# Patient Record
Sex: Male | Born: 1935 | Race: White | Hispanic: No | Smoking: Never smoker
Health system: Southern US, Community
[De-identification: ages and names within clinical notes are randomized; demographics above are authoritative.]

## PROBLEM LIST (undated history)

## (undated) DIAGNOSIS — E039 Hypothyroidism, unspecified: Secondary | ICD-10-CM

## (undated) DIAGNOSIS — I10 Essential (primary) hypertension: Secondary | ICD-10-CM

## (undated) DIAGNOSIS — G8918 Other acute postprocedural pain: Secondary | ICD-10-CM

## (undated) DIAGNOSIS — C439 Malignant melanoma of skin, unspecified: Secondary | ICD-10-CM

## (undated) DIAGNOSIS — IMO0002 Reserved for concepts with insufficient information to code with codable children: Secondary | ICD-10-CM

## (undated) DIAGNOSIS — E785 Hyperlipidemia, unspecified: Secondary | ICD-10-CM

## (undated) DIAGNOSIS — N289 Disorder of kidney and ureter, unspecified: Secondary | ICD-10-CM

## (undated) DIAGNOSIS — K219 Gastro-esophageal reflux disease without esophagitis: Secondary | ICD-10-CM

## (undated) DIAGNOSIS — M0520 Rheumatoid vasculitis with rheumatoid arthritis of unspecified site: Secondary | ICD-10-CM

## (undated) DIAGNOSIS — C801 Malignant (primary) neoplasm, unspecified: Secondary | ICD-10-CM

## (undated) DIAGNOSIS — T7840XA Allergy, unspecified, initial encounter: Secondary | ICD-10-CM

## (undated) DIAGNOSIS — K2270 Barrett's esophagus without dysplasia: Secondary | ICD-10-CM

## (undated) DIAGNOSIS — G4730 Sleep apnea, unspecified: Secondary | ICD-10-CM

## (undated) DIAGNOSIS — I499 Cardiac arrhythmia, unspecified: Secondary | ICD-10-CM

## (undated) DIAGNOSIS — M1990 Unspecified osteoarthritis, unspecified site: Secondary | ICD-10-CM

## (undated) DIAGNOSIS — D649 Anemia, unspecified: Secondary | ICD-10-CM

## (undated) DIAGNOSIS — K5792 Diverticulitis of intestine, part unspecified, without perforation or abscess without bleeding: Secondary | ICD-10-CM

## (undated) DIAGNOSIS — H269 Unspecified cataract: Secondary | ICD-10-CM

## (undated) DIAGNOSIS — B019 Varicella without complication: Secondary | ICD-10-CM

## (undated) HISTORY — PX: CARDIAC CATHETERIZATION: SHX172

## (undated) HISTORY — DX: Hyperlipidemia, unspecified: E78.5

## (undated) HISTORY — DX: Essential (primary) hypertension: I10

## (undated) HISTORY — DX: Malignant melanoma of skin, unspecified: C43.9

## (undated) HISTORY — DX: Varicella without complication: B01.9

## (undated) HISTORY — DX: Diverticulitis of intestine, part unspecified, without perforation or abscess without bleeding: K57.92

## (undated) HISTORY — PX: COLON SURGERY: SHX602

## (undated) HISTORY — DX: Rheumatoid vasculitis with rheumatoid arthritis of unspecified site: M05.20

## (undated) HISTORY — PX: OTHER SURGICAL HISTORY: SHX169

## (undated) HISTORY — PX: HEMORRHOID SURGERY: SHX153

## (undated) HISTORY — DX: Unspecified osteoarthritis, unspecified site: M19.90

## (undated) HISTORY — DX: Reserved for concepts with insufficient information to code with codable children: IMO0002

## (undated) HISTORY — DX: Cardiac arrhythmia, unspecified: I49.9

## (undated) HISTORY — DX: Gastro-esophageal reflux disease without esophagitis: K21.9

## (undated) HISTORY — DX: Anemia, unspecified: D64.9

## (undated) HISTORY — DX: Malignant (primary) neoplasm, unspecified: C80.1

## (undated) HISTORY — DX: Unspecified cataract: H26.9

## (undated) HISTORY — DX: Sleep apnea, unspecified: G47.30

## (undated) HISTORY — DX: Disorder of kidney and ureter, unspecified: N28.9

## (undated) HISTORY — PX: PROSTATE SURGERY: SHX751

## (undated) HISTORY — PX: COLONOSCOPY W/ POLYPECTOMY: SHX1380

## (undated) HISTORY — DX: Allergy, unspecified, initial encounter: T78.40XA

---
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
## 2001-08-02 HISTORY — PX: OTHER SURGICAL HISTORY: SHX169

## 2004-12-21 ENCOUNTER — Inpatient Hospital Stay: Payer: Self-pay | Admitting: Surgery

## 2005-04-12 ENCOUNTER — Ambulatory Visit: Payer: Self-pay | Admitting: Cardiology

## 2005-06-28 ENCOUNTER — Ambulatory Visit: Payer: Self-pay | Admitting: Unknown Physician Specialty

## 2005-07-13 ENCOUNTER — Ambulatory Visit: Payer: Self-pay | Admitting: Internal Medicine

## 2006-10-03 ENCOUNTER — Ambulatory Visit: Payer: Self-pay | Admitting: Internal Medicine

## 2007-06-20 ENCOUNTER — Ambulatory Visit: Payer: Self-pay | Admitting: Unknown Physician Specialty

## 2008-03-07 ENCOUNTER — Ambulatory Visit: Payer: Self-pay | Admitting: Internal Medicine

## 2008-03-08 ENCOUNTER — Ambulatory Visit: Payer: Self-pay | Admitting: Internal Medicine

## 2008-03-27 ENCOUNTER — Ambulatory Visit: Payer: Self-pay | Admitting: Internal Medicine

## 2009-12-01 ENCOUNTER — Ambulatory Visit: Payer: Self-pay | Admitting: Unknown Physician Specialty

## 2010-06-15 ENCOUNTER — Ambulatory Visit: Payer: Self-pay | Admitting: Internal Medicine

## 2010-06-21 ENCOUNTER — Ambulatory Visit: Payer: Self-pay | Admitting: Internal Medicine

## 2010-10-21 ENCOUNTER — Ambulatory Visit: Payer: Self-pay | Admitting: Internal Medicine

## 2010-12-16 ENCOUNTER — Ambulatory Visit: Payer: Self-pay | Admitting: Internal Medicine

## 2011-03-15 ENCOUNTER — Ambulatory Visit: Payer: Self-pay | Admitting: Unknown Physician Specialty

## 2011-03-15 LAB — HM COLONOSCOPY

## 2011-03-16 LAB — PATHOLOGY REPORT

## 2012-07-17 ENCOUNTER — Other Ambulatory Visit: Payer: Self-pay | Admitting: Internal Medicine

## 2012-08-10 ENCOUNTER — Encounter: Payer: Self-pay | Admitting: Internal Medicine

## 2012-08-10 ENCOUNTER — Ambulatory Visit (INDEPENDENT_AMBULATORY_CARE_PROVIDER_SITE_OTHER): Payer: Medicare Other | Admitting: Internal Medicine

## 2012-08-10 VITALS — BP 136/66 | HR 63 | Temp 98.6°F | Ht 68.0 in | Wt 218.0 lb

## 2012-08-10 DIAGNOSIS — C439 Malignant melanoma of skin, unspecified: Secondary | ICD-10-CM

## 2012-08-10 DIAGNOSIS — G4730 Sleep apnea, unspecified: Secondary | ICD-10-CM

## 2012-08-10 DIAGNOSIS — E7800 Pure hypercholesterolemia, unspecified: Secondary | ICD-10-CM

## 2012-08-10 DIAGNOSIS — C61 Malignant neoplasm of prostate: Secondary | ICD-10-CM

## 2012-08-10 DIAGNOSIS — Z125 Encounter for screening for malignant neoplasm of prostate: Secondary | ICD-10-CM

## 2012-08-10 DIAGNOSIS — I10 Essential (primary) hypertension: Secondary | ICD-10-CM

## 2012-08-10 MED ORDER — OMEPRAZOLE 20 MG PO CPDR: 20.0000 mg | DELAYED_RELEASE_CAPSULE | Freq: Two times a day (BID) | ORAL | Status: DC

## 2012-08-10 MED ORDER — HYDROCHLOROTHIAZIDE 25 MG PO TABS: 25.0000 mg | ORAL_TABLET | Freq: Every day | ORAL | Status: DC

## 2012-08-10 MED ORDER — TRAMADOL HCL 50 MG PO TABS: 50.0000 mg | ORAL_TABLET | Freq: Four times a day (QID) | ORAL | Status: DC | PRN

## 2012-08-13 ENCOUNTER — Encounter: Payer: Self-pay | Admitting: Internal Medicine

## 2012-08-13 DIAGNOSIS — C61 Malignant neoplasm of prostate: Secondary | ICD-10-CM | POA: Insufficient documentation

## 2012-08-13 DIAGNOSIS — Z8546 Personal history of malignant neoplasm of prostate: Secondary | ICD-10-CM | POA: Insufficient documentation

## 2012-08-13 DIAGNOSIS — G4730 Sleep apnea, unspecified: Secondary | ICD-10-CM | POA: Insufficient documentation

## 2012-08-13 DIAGNOSIS — C439 Malignant melanoma of skin, unspecified: Secondary | ICD-10-CM | POA: Insufficient documentation

## 2012-08-13 NOTE — Assessment & Plan Note (Signed)
Blood pressure doing better.  Same med regimen.  Check metabolic panel.

## 2012-08-13 NOTE — Assessment & Plan Note (Signed)
Unable to tolerate the mask.  Declines another trial.  Understands risk of untreated sleep apnea.  Avoid lying supine and avoid sedating medications.   

## 2012-08-13 NOTE — Progress Notes (Signed)
  Subjective:    [name], male    DOB: 08-01-1935, 78 y.o.   MRN: [id number]  HPI 78 year old male with past history of prostate cancer s/p retropubic prostatectomy, hypertension, gastritis and known sleep apnea.  He comes in today for a scheduled follow up.  He has lost approximately 20 lbs.  Since he has lost weight, he feels better.  Breathing is better.  No active cardiac symptoms.  Still not able to use CPAP.  Discussed with him again today.  He is not interested in pursuing.  Bowels stable.    Past Medical History  Diagnosis Date  . Arthritis   . Cancer     prostate,skin  . Chicken pox   . Diverticulitis   . Ulcer   . Hypertension     Current Outpatient Prescriptions on File Prior to Visit  Medication Sig Dispense Refill  . amLODipine (NORVASC) 5 MG tablet Take 5 mg by mouth daily.      . hydrochlorothiazide (HYDRODIURIL) 25 MG tablet Take 1 tablet (25 mg total) by mouth daily.  30 tablet  6  . hyoscyamine (LEVSIN, ANASPAZ) 0.125 MG tablet Take 0.125 mg by mouth as needed.      . irbesartan (AVAPRO) 75 MG tablet Take 75 mg by mouth at bedtime.      Marland Kitchen levothyroxine (SYNTHROID, LEVOTHROID) 75 MCG tablet Take 75 mcg by mouth daily.      Marland Kitchen omeprazole (PRILOSEC) 20 MG capsule Take 1 capsule (20 mg total) by mouth 2 (two) times daily.  60 capsule  6    Review of Systems Patient denies any headache, lightheadedness or dizziness.  No sinus or allergy symptoms.  No chest pain, tightness or palpitations.  No increased shortness of breath, cough or congestion.  No nausea or vomiting.  No abdominal pain or cramping.  No bowel change, such as diarrhea, constipation, BRBPR or melana.  No urine change.        Objective:   Physical Exam Filed Vitals:   08/10/12 1528  BP: 136/66  Pulse: 63  Temp: 98.6 F (31 C)   78 year old male in no acute distress.   HEENT:  Nares - clear.  OP- without lesions or erythema.  NECK:  Supple, nontender.  No audible bruit.   HEART:   Appears to be regular - rate 52-56.  LUNGS:  Without crackles or wheezing audible.  Respirations even and unlabored.   RADIAL PULSE:  Equal bilaterally.  ABDOMEN:  Soft, nontender.  No audible abdominal bruit.   EXTREMITIES:  No increased edema to be present.                   Assessment & Plan:  CARDIOVASCULAR.  Just evaluated by Dr Gwen Pounds 07/10/12.  See his note for details.  Stable.  No changes made.  Doing better and feeling better since losing weight.   GI.  Colonoscopy 03/15/11 revealed multiple polyps and internal hemorrhoids.  Bowels stable.  Planning for a follow up colonoscopy 11/01/12.  Apparently planning  EGD then as well.    HEALTH MAINTENANCE.  Physical 12/13/11.  Colonoscopy as outlined.  Check psa with next labs.

## 2012-08-13 NOTE — Assessment & Plan Note (Signed)
CXR 04/13/11 - no acute abnormality.  Liver panel previously wnl.  Folllow.

## 2012-08-13 NOTE — Assessment & Plan Note (Signed)
Has adjusted his diet and lost weight.  Check lipid panel.

## 2012-08-13 NOTE — Assessment & Plan Note (Signed)
History of prostate cancer.  Check psa with next labs.

## 2012-08-14 ENCOUNTER — Other Ambulatory Visit (INDEPENDENT_AMBULATORY_CARE_PROVIDER_SITE_OTHER): Payer: Medicare Other

## 2012-08-14 DIAGNOSIS — E7800 Pure hypercholesterolemia, unspecified: Secondary | ICD-10-CM

## 2012-08-14 DIAGNOSIS — Z125 Encounter for screening for malignant neoplasm of prostate: Secondary | ICD-10-CM

## 2012-08-14 DIAGNOSIS — I10 Essential (primary) hypertension: Secondary | ICD-10-CM

## 2012-08-14 DIAGNOSIS — C439 Malignant melanoma of skin, unspecified: Secondary | ICD-10-CM

## 2012-08-14 LAB — BASIC METABOLIC PANEL
BUN: 18 mg/dL (ref 6–23)
Calcium: 9.7 mg/dL (ref 8.4–10.5)
Creatinine, Ser: 1.4 mg/dL (ref 0.4–1.5)
GFR: 50.81 mL/min — ABNORMAL LOW (ref >60.00)
Glucose, Bld: 105 mg/dL — ABNORMAL HIGH (ref 70–99)

## 2012-08-14 LAB — PSA, MEDICARE: PSA: 0.01 ng/ml — ABNORMAL LOW (ref 0.10–4.00)

## 2012-08-14 LAB — HEPATIC FUNCTION PANEL
ALT: 31 U/L (ref 0–53)
AST: 26 U/L (ref 0–37)
Alkaline Phosphatase: 52 U/L (ref 39–117)
Bilirubin, Direct: 0.1 mg/dL (ref 0.0–0.3)
Total Bilirubin: 0.8 mg/dL (ref 0.3–1.2)

## 2012-08-15 ENCOUNTER — Telehealth: Payer: Self-pay | Admitting: Internal Medicine

## 2012-08-15 DIAGNOSIS — N289 Disorder of kidney and ureter, unspecified: Secondary | ICD-10-CM

## 2012-08-15 DIAGNOSIS — R739 Hyperglycemia, unspecified: Secondary | ICD-10-CM

## 2012-08-15 NOTE — Telephone Encounter (Signed)
Lab appointment 2/5 pt aware

## 2012-08-15 NOTE — Telephone Encounter (Signed)
I notified pt of his lab results via my chart.  He needs a lab appt in 3 weeks.  Please call and schedule a time for him to come in.  Thanks.

## 2012-09-06 ENCOUNTER — Other Ambulatory Visit (INDEPENDENT_AMBULATORY_CARE_PROVIDER_SITE_OTHER): Payer: Medicare Other

## 2012-09-06 DIAGNOSIS — R739 Hyperglycemia, unspecified: Secondary | ICD-10-CM

## 2012-09-06 DIAGNOSIS — R7309 Other abnormal glucose: Secondary | ICD-10-CM

## 2012-09-06 DIAGNOSIS — N289 Disorder of kidney and ureter, unspecified: Secondary | ICD-10-CM

## 2012-09-06 LAB — BASIC METABOLIC PANEL
BUN: 14 mg/dL (ref 6–23)
Chloride: 102 mEq/L (ref 96–112)
Creatinine, Ser: 1.2 mg/dL (ref 0.4–1.5)
Glucose, Bld: 101 mg/dL — ABNORMAL HIGH (ref 70–99)

## 2012-09-06 LAB — HEMOGLOBIN A1C: Hgb A1c MFr Bld: 5.3 % (ref 4.6–6.5)

## 2012-09-08 ENCOUNTER — Telehealth: Payer: Self-pay | Admitting: Internal Medicine

## 2012-09-08 NOTE — Telephone Encounter (Signed)
Notified of labs via my chart 

## 2012-09-12 ENCOUNTER — Telehealth: Payer: Self-pay | Admitting: General Practice

## 2012-09-12 NOTE — Telephone Encounter (Signed)
Pharmacy called stating pt needs Avapro 300mg  and norvasc 5mg . Our records show pt is on Avapro 75mg . Please advise.

## 2012-09-13 NOTE — Telephone Encounter (Signed)
Please call [facility] and clarify dosing of avapro.  I want him to stay on what he has been on.  Ok to refill both at correct dose.

## 2012-09-18 ENCOUNTER — Other Ambulatory Visit: Payer: Self-pay | Admitting: *Deleted

## 2012-09-18 MED ORDER — IRBESARTAN 300 MG PO TABS: 300.0000 mg | ORAL_TABLET | Freq: Every day | ORAL | Status: DC

## 2012-09-18 NOTE — Telephone Encounter (Signed)
[facility] and clarified dose. 300 mg once a day and also gave refills.

## 2012-09-19 MED ORDER — AMLODIPINE BESYLATE 5 MG PO TABS: 5.0000 mg | ORAL_TABLET | Freq: Every day | ORAL | Status: DC

## 2012-09-19 NOTE — Telephone Encounter (Signed)
Sent in to pharmacy.  

## 2012-09-28 ENCOUNTER — Encounter: Payer: Self-pay | Admitting: *Deleted

## 2012-09-29 ENCOUNTER — Encounter: Payer: Self-pay | Admitting: Internal Medicine

## 2012-09-29 ENCOUNTER — Ambulatory Visit (INDEPENDENT_AMBULATORY_CARE_PROVIDER_SITE_OTHER): Payer: Medicare Other | Admitting: Internal Medicine

## 2012-09-29 VITALS — BP 118/70 | HR 62 | Temp 97.9°F | Ht 68.0 in | Wt 219.2 lb

## 2012-09-29 DIAGNOSIS — C439 Malignant melanoma of skin, unspecified: Secondary | ICD-10-CM

## 2012-09-29 DIAGNOSIS — Z860100 Personal history of colon polyps, unspecified: Secondary | ICD-10-CM

## 2012-09-29 DIAGNOSIS — I10 Essential (primary) hypertension: Secondary | ICD-10-CM

## 2012-09-29 DIAGNOSIS — E7800 Pure hypercholesterolemia, unspecified: Secondary | ICD-10-CM

## 2012-09-29 DIAGNOSIS — G4730 Sleep apnea, unspecified: Secondary | ICD-10-CM

## 2012-09-29 DIAGNOSIS — C61 Malignant neoplasm of prostate: Secondary | ICD-10-CM

## 2012-09-29 MED ORDER — AMLODIPINE BESYLATE 5 MG PO TABS: 5.0000 mg | ORAL_TABLET | Freq: Every day | ORAL | Status: DC

## 2012-09-29 MED ORDER — IRBESARTAN 300 MG PO TABS: 300.0000 mg | ORAL_TABLET | Freq: Every day | ORAL | Status: DC

## 2012-10-01 ENCOUNTER — Encounter: Payer: Self-pay | Admitting: Internal Medicine

## 2012-10-01 NOTE — Assessment & Plan Note (Signed)
Has adjusted his diet and lost weight.  Lipid panel just checked revealed total cholesterol 207, triglycerides 189, HDL 54 and LDL 111.  Follow.

## 2012-10-01 NOTE — Assessment & Plan Note (Signed)
Unable to tolerate the mask.  Declines another trial.  Understands risk of untreated sleep apnea.  Avoid lying supine and avoid sedating medications.   

## 2012-10-01 NOTE — Progress Notes (Signed)
Subjective:    [name], male    DOB: 1934-11-22, 78 y.o.   MRN: [id number]  HPI 78 year old male with past history of prostate cancer s/p retropubic prostatectomy, hypertension, gastritis and known sleep apnea.  He comes in today for a scheduled follow up.  He has lost weight.  Since he has lost weight, he feels better.  Breathing is better.  No active cardiac symptoms.  Still not able to use CPAP.   Bowels stable.  Blood pressure averaging 130s/70-80.  Overall he feels he is doing relatively well.     Past Medical History  Diagnosis Date  . Arthritis   . Cancer     prostate,skin  . Chicken pox   . Diverticulitis   . Ulcer   . Hypertension   . Sleep apnea   . Hyperlipidemia   . Melanoma     Malignant resection  . Anemia   . GERD (gastroesophageal reflux disease)     Current Outpatient Prescriptions on File Prior to Visit  Medication Sig Dispense Refill  . acetaminophen (TYLENOL) 500 MG tablet Take 500 mg by mouth every 6 (six) hours as needed.      Marland Kitchen aspirin 81 MG tablet Take 81 mg by mouth daily.      Jennette Banker Sodium 30-100 MG CAPS Take by mouth.      Tery Sanfilippo Calcium (ECK SHURE LAX PO) Take by mouth.      . hydrochlorothiazide (HYDRODIURIL) 25 MG tablet Take 1 tablet (25 mg total) by mouth daily.  30 tablet  6  . hyoscyamine (LEVSIN, ANASPAZ) 0.125 MG tablet Take 0.125 mg by mouth as needed.      Marland Kitchen levothyroxine (SYNTHROID, LEVOTHROID) 75 MCG tablet Take 75 mcg by mouth daily.      Marland Kitchen omeprazole (PRILOSEC) 20 MG capsule Take 1 capsule (20 mg total) by mouth 2 (two) times daily.  60 capsule  6  . traMADol (ULTRAM) 50 MG tablet Take 1 tablet (50 mg total) by mouth every 6 (six) hours as needed.  60 tablet  1  . valACYclovir (VALTREX) 1000 MG tablet Take 1,000 mg by mouth daily.       No current facility-administered medications on file prior to visit.    Review of Systems Patient denies any headache, lightheadedness or dizziness.  No sinus or  allergy symptoms.  No chest pain, tightness or palpitations.  No increased shortness of breath, cough or congestion.  No nausea or vomiting.  No increased abdominal pain or cramping.  States his abdomen feels better.  No bowel change, such as diarrhea, constipation, BRBPR or melana.  No urine change.        Objective:   Physical Exam  Filed Vitals:   09/29/12 1134  BP: 118/70  Pulse: 62  Temp: 97.9 F (36.6 C)   Blood pressure recheck:  138/78, pulse 58  78 year old male in no acute distress.   HEENT:  Nares - clear.  OP- without lesions or erythema.  NECK:  Supple, nontender.  No audible bruit.   HEART:  Appears to be regular. LUNGS:  Without crackles or wheezing audible.  Respirations even and unlabored.   RADIAL PULSE:  Equal bilaterally.  ABDOMEN:  Soft, nontender.  No audible abdominal bruit.   EXTREMITIES:  No increased edema to be present.                   Assessment & Plan:  CARDIOVASCULAR.  Just evaluated by  Dr Gwen Pounds 07/10/12.  See his note for details.  Stable.  No changes made.  Doing better and feeling better since losing weight.  ECHO 12/21/11 - EF 45%, mild biatrial enlargement, moderate mitral and tricuspid insufficiency and mild pulmonary hypertension.    GI.  Colonoscopy 03/15/11 revealed multiple polyps and internal hemorrhoids.  Bowels stable.  Planning for a follow up colonoscopy 4/14.  Apparently planning  EGD then as well.    HEALTH MAINTENANCE.  Physical 12/13/11.  Colonoscopy as outlined.  PSA 08/14/12 - .01.

## 2012-10-01 NOTE — Assessment & Plan Note (Signed)
History of prostate cancer.  PSA .01 (08/14/12).

## 2012-10-01 NOTE — Assessment & Plan Note (Signed)
Colonoscopy 8/12 as outlined.  Due to follow up with Fransico Setters 11/01/12.  Planning for a f/u colonoscopy and EGD (per patient).

## 2012-10-01 NOTE — Assessment & Plan Note (Signed)
Blood pressure doing better.  Same med regimen.  Follow metabolic panel.  Cr just checked 1.4.

## 2012-10-01 NOTE — Assessment & Plan Note (Signed)
CXR 04/13/11 - no acute abnormality.  Liver panel previously wnl.  Follow with dermatology.    

## 2012-11-27 ENCOUNTER — Ambulatory Visit: Payer: Self-pay | Admitting: Unknown Physician Specialty

## 2012-11-29 LAB — PATHOLOGY REPORT

## 2012-12-07 ENCOUNTER — Encounter: Payer: Self-pay | Admitting: Internal Medicine

## 2012-12-07 DIAGNOSIS — Z86010 Personal history of colonic polyps: Secondary | ICD-10-CM

## 2012-12-07 DIAGNOSIS — K2270 Barrett's esophagus without dysplasia: Secondary | ICD-10-CM | POA: Insufficient documentation

## 2012-12-09 ENCOUNTER — Encounter: Payer: Self-pay | Admitting: Internal Medicine

## 2012-12-29 ENCOUNTER — Encounter: Payer: Self-pay | Admitting: Internal Medicine

## 2013-01-08 ENCOUNTER — Other Ambulatory Visit: Payer: Self-pay | Admitting: Internal Medicine

## 2013-01-09 ENCOUNTER — Other Ambulatory Visit (INDEPENDENT_AMBULATORY_CARE_PROVIDER_SITE_OTHER): Payer: Medicare Other

## 2013-01-09 DIAGNOSIS — C439 Malignant melanoma of skin, unspecified: Secondary | ICD-10-CM

## 2013-01-09 DIAGNOSIS — G4730 Sleep apnea, unspecified: Secondary | ICD-10-CM

## 2013-01-09 DIAGNOSIS — E7800 Pure hypercholesterolemia, unspecified: Secondary | ICD-10-CM

## 2013-01-09 DIAGNOSIS — I10 Essential (primary) hypertension: Secondary | ICD-10-CM

## 2013-01-09 LAB — CBC WITH DIFFERENTIAL/PLATELET
Basophils Absolute: 0 10*3/uL (ref 0.0–0.1)
Basophils Relative: 0.5 % (ref 0.0–3.0)
Eosinophils Relative: 1.9 % (ref 0.0–5.0)
HCT: 45.1 % (ref 39.0–52.0)
Hemoglobin: 15.2 g/dL (ref 13.0–17.0)
Lymphocytes Relative: 38.2 % (ref 12.0–46.0)
Monocytes Relative: 10.4 % (ref 3.0–12.0)
Neutro Abs: 3.2 10*3/uL (ref 1.4–7.7)
RBC: 4.81 Mil/uL (ref 4.22–5.81)
RDW: 13.1 % (ref 11.5–14.6)
WBC: 6.5 10*3/uL (ref 4.5–10.5)

## 2013-01-09 LAB — BASIC METABOLIC PANEL
Calcium: 9.4 mg/dL (ref 8.4–10.5)
GFR: 65.79 mL/min (ref >60.00)
Potassium: 3.7 mEq/L (ref 3.5–5.1)
Sodium: 140 mEq/L (ref 135–145)

## 2013-01-09 LAB — LIPID PANEL
HDL: 47.3 mg/dL (ref >39.00)
Triglycerides: 289 mg/dL — ABNORMAL HIGH (ref 0.0–149.0)
VLDL: 57.8 mg/dL — ABNORMAL HIGH (ref 0.0–40.0)

## 2013-01-09 LAB — HEPATIC FUNCTION PANEL
ALT: 41 U/L (ref 0–53)
Total Bilirubin: 0.7 mg/dL (ref 0.3–1.2)

## 2013-01-09 LAB — LDL CHOLESTEROL, DIRECT: Direct LDL: 116.1 mg/dL

## 2013-01-10 ENCOUNTER — Encounter: Payer: Self-pay | Admitting: Internal Medicine

## 2013-01-12 ENCOUNTER — Encounter: Payer: Self-pay | Admitting: Internal Medicine

## 2013-01-12 ENCOUNTER — Ambulatory Visit (INDEPENDENT_AMBULATORY_CARE_PROVIDER_SITE_OTHER): Payer: Medicare Other | Admitting: Internal Medicine

## 2013-01-12 VITALS — BP 130/70 | HR 62 | Temp 98.3°F | Ht 68.0 in | Wt 226.5 lb

## 2013-01-12 DIAGNOSIS — E7800 Pure hypercholesterolemia, unspecified: Secondary | ICD-10-CM

## 2013-01-12 DIAGNOSIS — I10 Essential (primary) hypertension: Secondary | ICD-10-CM

## 2013-01-12 DIAGNOSIS — C61 Malignant neoplasm of prostate: Secondary | ICD-10-CM

## 2013-01-12 DIAGNOSIS — C439 Malignant melanoma of skin, unspecified: Secondary | ICD-10-CM

## 2013-01-12 DIAGNOSIS — G4730 Sleep apnea, unspecified: Secondary | ICD-10-CM

## 2013-01-12 DIAGNOSIS — K2270 Barrett's esophagus without dysplasia: Secondary | ICD-10-CM

## 2013-01-12 DIAGNOSIS — Z86010 Personal history of colonic polyps: Secondary | ICD-10-CM

## 2013-01-14 ENCOUNTER — Encounter: Payer: Self-pay | Admitting: Internal Medicine

## 2013-01-14 NOTE — Assessment & Plan Note (Signed)
Just had EGD.  See report for details.  Due f/u 11/2015.  Currently asymptomatic.   

## 2013-01-14 NOTE — Assessment & Plan Note (Signed)
Blood pressure doing better.  Same med regimen.  Follow metabolic panel.    

## 2013-01-14 NOTE — Assessment & Plan Note (Signed)
CXR 04/13/11 - no acute abnormality.  Liver panel previously wnl.  Follow with dermatology.    

## 2013-01-14 NOTE — Assessment & Plan Note (Signed)
History of prostate cancer.  PSA .01 (08/14/12).

## 2013-01-14 NOTE — Progress Notes (Signed)
Subjective:    [name], male    DOB: 12-02-1934, 78 y.o.   MRN: [id number]  HPI 78 year old male with past history of prostate cancer s/p retropubic prostatectomy, hypertension, gastritis and known sleep apnea.  He comes in today to follow up on these issues as well as for a complete physical exam.   Breathing is stable.  No active cardiac symptoms.  Still not able to use CPAP.   Bowels stable.  Blood pressure averaging 120-130s/70s.  Overall he feels he is doing relatively well.  No increased heart rate or palpitations.     Past Medical History  Diagnosis Date  . Arthritis   . Cancer     prostate,skin  . Chicken pox   . Diverticulitis   . Ulcer   . Hypertension   . Sleep apnea   . Hyperlipidemia   . Melanoma     Malignant resection  . Anemia   . GERD (gastroesophageal reflux disease)     Current Outpatient Prescriptions on File Prior to Visit  Medication Sig Dispense Refill  . acetaminophen (TYLENOL) 500 MG tablet Take 500 mg by mouth every 6 (six) hours as needed.      Marland Kitchen amLODipine (NORVASC) 5 MG tablet Take 1 tablet (5 mg total) by mouth daily.  30 tablet  5  . aspirin 81 MG tablet Take 81 mg by mouth daily.      Jennette Banker Sodium 30-100 MG CAPS Take by mouth.      Tery Sanfilippo Calcium (ECK SHURE LAX PO) Take by mouth.      . hydrochlorothiazide (HYDRODIURIL) 25 MG tablet Take 1 tablet (25 mg total) by mouth daily.  30 tablet  6  . hyoscyamine (LEVSIN, ANASPAZ) 0.125 MG tablet Take 0.125 mg by mouth as needed.      . irbesartan (AVAPRO) 300 MG tablet Take 1 tablet (300 mg total) by mouth daily.  30 tablet  5  . levothyroxine (SYNTHROID, LEVOTHROID) 75 MCG tablet TAKE ONE (1) TABLET EACH DAY  90 tablet  2  . omeprazole (PRILOSEC) 20 MG capsule Take 1 capsule (20 mg total) by mouth 2 (two) times daily.  60 capsule  6  . traMADol (ULTRAM) 50 MG tablet Take 1 tablet (50 mg total) by mouth every 6 (six) hours as needed.  60 tablet  1  . valACYclovir  (VALTREX) 1000 MG tablet Take 1,000 mg by mouth daily.       No current facility-administered medications on file prior to visit.    Review of Systems Patient denies any headache, lightheadedness or dizziness.  No sinus or allergy symptoms.  No chest pain, tightness or palpitations.  No increased shortness of breath, cough or congestion.  Breathing stable.  Tries to stay active.  No nausea or vomiting.  No increased abdominal pain or cramping.  States his abdomen feels better.  No bowel change, such as diarrhea, constipation, BRBPR or melana.  No urine change.        Objective:   Physical Exam  Filed Vitals:   01/12/13 1327  BP: 130/70  Pulse: 62  Temp: 98.3 F (14.76 C)   78 year old male in no acute distress.  HEENT:  Nares - clear.  Oropharynx - without lesions. NECK:  Supple.  Nontender.  No audible carotid bruit.  HEART:  Appears to be regular.   LUNGS:  No crackles or wheezing audible.  Respirations even and unlabored.   RADIAL PULSE:  Equal bilaterally.  ABDOMEN:  Soft.  Nontender.  Bowel sounds present and normal.  No audible abdominal bruit.  GU:  Normal descended testicles.  No palpable testicular nodules.   RECTAL:  Not performed.  Is s/p prostatectomy.   EXTREMITIES:  No increased edema present.  DP pulses palpable and equal bilaterally.          Assessment & Plan:  CARDIOVASCULAR.  Just evaluated by Dr Gwen Pounds 07/10/12.  See his note for details.  Stable.  No changes made.  Doing better and feeling better.  ECHO 12/21/11 - EF 45%, mild biatrial enlargement, moderate mitral and tricuspid insufficiency and mild pulmonary hypertension.    GI.  Colonoscopy 03/15/11 revealed multiple polyps and internal hemorrhoids.  Follow up colonoscopy and EGD 4/14.  Need to obtain results.  Bowels stable.     HEALTH MAINTENANCE.  Physical today.   Colonoscopy as outlined.  PSA 08/14/12 - .01.

## 2013-01-14 NOTE — Assessment & Plan Note (Signed)
Colonoscopy 8/12 as outlined.  Had f/u colonoscopy and EGD 4/14.  Obtain results.

## 2013-01-14 NOTE — Assessment & Plan Note (Signed)
Unable to tolerate the mask.  Declines another trial.  Understands risk of untreated sleep apnea.  Avoid lying supine and avoid sedating medications.   

## 2013-01-14 NOTE — Assessment & Plan Note (Signed)
Fasting lipid profile just checked and revealed total cholesterol 207, triglycerides 289, HDL 47 and LDL 116.  Low carb diet.  Follow.

## 2013-03-07 ENCOUNTER — Other Ambulatory Visit: Payer: Self-pay

## 2013-03-12 ENCOUNTER — Other Ambulatory Visit: Payer: Self-pay | Admitting: Internal Medicine

## 2013-04-10 ENCOUNTER — Other Ambulatory Visit: Payer: Self-pay | Admitting: Internal Medicine

## 2013-04-10 NOTE — Telephone Encounter (Signed)
Refilled #60 with one refill 

## 2013-04-10 NOTE — Telephone Encounter (Signed)
Okay to refill? 

## 2013-04-11 ENCOUNTER — Encounter: Payer: Self-pay | Admitting: *Deleted

## 2013-04-12 ENCOUNTER — Other Ambulatory Visit: Payer: Self-pay | Admitting: Internal Medicine

## 2013-04-30 ENCOUNTER — Encounter: Payer: Self-pay | Admitting: Internal Medicine

## 2013-05-09 ENCOUNTER — Telehealth: Payer: Self-pay | Admitting: *Deleted

## 2013-05-09 DIAGNOSIS — E7800 Pure hypercholesterolemia, unspecified: Secondary | ICD-10-CM

## 2013-05-09 DIAGNOSIS — C61 Malignant neoplasm of prostate: Secondary | ICD-10-CM

## 2013-05-09 DIAGNOSIS — E039 Hypothyroidism, unspecified: Secondary | ICD-10-CM

## 2013-05-09 DIAGNOSIS — I10 Essential (primary) hypertension: Secondary | ICD-10-CM

## 2013-05-09 NOTE — Telephone Encounter (Signed)
Orders placed for labs.  Thanks.

## 2013-05-09 NOTE — Telephone Encounter (Signed)
Pt is coming in for labs tomorrow what labs and dx?  

## 2013-05-10 ENCOUNTER — Other Ambulatory Visit (INDEPENDENT_AMBULATORY_CARE_PROVIDER_SITE_OTHER): Payer: Medicare Other

## 2013-05-10 DIAGNOSIS — C61 Malignant neoplasm of prostate: Secondary | ICD-10-CM

## 2013-05-10 DIAGNOSIS — I10 Essential (primary) hypertension: Secondary | ICD-10-CM

## 2013-05-10 DIAGNOSIS — E039 Hypothyroidism, unspecified: Secondary | ICD-10-CM

## 2013-05-10 DIAGNOSIS — E7800 Pure hypercholesterolemia, unspecified: Secondary | ICD-10-CM

## 2013-05-10 LAB — BASIC METABOLIC PANEL
BUN: 19 mg/dL (ref 6–23)
CO2: 29 mEq/L (ref 19–32)
Chloride: 100 mEq/L (ref 96–112)
Potassium: 4.3 mEq/L (ref 3.5–5.1)

## 2013-05-10 LAB — LIPID PANEL
Cholesterol: 210 mg/dL — ABNORMAL HIGH (ref 0–200)
Total CHOL/HDL Ratio: 4
VLDL: 43.4 mg/dL — ABNORMAL HIGH (ref 0.0–40.0)

## 2013-05-10 LAB — HEPATIC FUNCTION PANEL
Albumin: 4.3 g/dL (ref 3.5–5.2)
Alkaline Phosphatase: 50 U/L (ref 39–117)

## 2013-05-10 LAB — PSA, MEDICARE: PSA: 0 ng/ml — ABNORMAL LOW (ref 0.10–4.00)

## 2013-05-14 ENCOUNTER — Encounter: Payer: Self-pay | Admitting: Internal Medicine

## 2013-05-14 ENCOUNTER — Ambulatory Visit (INDEPENDENT_AMBULATORY_CARE_PROVIDER_SITE_OTHER): Payer: Medicare Other | Admitting: Internal Medicine

## 2013-05-14 VITALS — BP 124/78 | HR 57 | Temp 98.0°F | Ht 68.0 in | Wt 233.2 lb

## 2013-05-14 DIAGNOSIS — I10 Essential (primary) hypertension: Secondary | ICD-10-CM

## 2013-05-14 DIAGNOSIS — G4730 Sleep apnea, unspecified: Secondary | ICD-10-CM

## 2013-05-14 DIAGNOSIS — R7989 Other specified abnormal findings of blood chemistry: Secondary | ICD-10-CM

## 2013-05-14 DIAGNOSIS — C439 Malignant melanoma of skin, unspecified: Secondary | ICD-10-CM

## 2013-05-14 DIAGNOSIS — C61 Malignant neoplasm of prostate: Secondary | ICD-10-CM

## 2013-05-14 DIAGNOSIS — Z86010 Personal history of colonic polyps: Secondary | ICD-10-CM

## 2013-05-14 DIAGNOSIS — K2270 Barrett's esophagus without dysplasia: Secondary | ICD-10-CM

## 2013-05-14 DIAGNOSIS — R945 Abnormal results of liver function studies: Secondary | ICD-10-CM

## 2013-05-14 DIAGNOSIS — E7800 Pure hypercholesterolemia, unspecified: Secondary | ICD-10-CM

## 2013-05-14 NOTE — Assessment & Plan Note (Signed)
Colonoscopy 8/12 as outlined.  Had f/u colonoscopy and EGD 4/14.  Results as outlined.  Recommended f/u colonoscopy 10/2017.

## 2013-05-14 NOTE — Progress Notes (Signed)
Subjective:    [name], male    DOB: 1934-10-25, 78 y.o.   MRN: [id number]  HPI 78 year old male with past history of prostate cancer s/p retropubic prostatectomy, hypertension, gastritis and known sleep apnea.  He comes in today for a scheduled follow up.   Breathing is stable.  No active cardiac symptoms.  Still not able to use CPAP.   Bowels stable.  Blood pressure averaging 120-140/70s.  Overall he feels he is doing relatively well.  No increased heart rate or palpitations.  Still with back pain.  Stable.  He is not watching his diet and not exercising like he was.  Has gained his weight back.     Past Medical History  Diagnosis Date  . Arthritis   . Cancer     prostate,skin  . Chicken pox   . Diverticulitis   . Ulcer   . Hypertension   . Sleep apnea   . Hyperlipidemia   . Melanoma     Malignant resection  . Anemia   . GERD (gastroesophageal reflux disease)     Current Outpatient Prescriptions on File Prior to Visit  Medication Sig Dispense Refill  . acetaminophen (TYLENOL) 500 MG tablet Take 500 mg by mouth every 6 (six) hours as needed.      Marland Kitchen amLODipine (NORVASC) 5 MG tablet TAKE ONE (1) TABLET EACH DAY  30 tablet  5  . aspirin 81 MG tablet Take 81 mg by mouth daily.      Jennette Banker Sodium 30-100 MG CAPS Take by mouth.      . hydrochlorothiazide (HYDRODIURIL) 25 MG tablet TAKE ONE (1) TABLET EACH DAY  30 tablet  5  . hyoscyamine (LEVSIN, ANASPAZ) 0.125 MG tablet Take 0.125 mg by mouth as needed.      . irbesartan (AVAPRO) 300 MG tablet TAKE ONE (1) TABLET EACH DAY  30 tablet  5  . L-Lysine 500 MG TABS Take by mouth daily.      Marland Kitchen levothyroxine (SYNTHROID, LEVOTHROID) 75 MCG tablet TAKE ONE (1) TABLET EACH DAY  90 tablet  2  . omeprazole (PRILOSEC) 20 MG capsule TAKE ONE CAPSULE TWICE A DAY  60 capsule  5  . traMADol (ULTRAM) 50 MG tablet TAKE ONE TABLET EVERY SIX HOURS WHEN NEEDED  60 tablet  1  . valACYclovir (VALTREX) 1000 MG tablet Take 1,000  mg by mouth daily.       No current facility-administered medications on file prior to visit.    Review of Systems Patient denies any headache, lightheadedness or dizziness.  No sinus or allergy symptoms.  No chest pain, tightness or palpitations.  No increased shortness of breath, cough or congestion.  Breathing stable.  Tries to stay active.  No nausea or vomiting.  No increased abdominal pain or cramping.  States his abdomen feels better.  No bowel change, such as diarrhea, constipation, BRBPR or melana.  No urine change.   Not watching his diet.  Blood pressures as outlined.      Objective:   Physical Exam  Filed Vitals:   05/14/13 1437  BP: 124/78  Pulse: 57  Temp: 98 F (58.34 C)   78 year old male in no acute distress.  HEENT:  Nares - clear.  Oropharynx - without lesions. NECK:  Supple.  Nontender.  No audible carotid bruit.  HEART:  Appears to be regular.   LUNGS:  No crackles or wheezing audible.  Respirations even and unlabored.   RADIAL PULSE:  Equal bilaterally.  ABDOMEN:  Soft.  Nontender.  Bowel sounds present and normal.  No audible abdominal bruit.   EXTREMITIES:  No increased edema present.  DP pulses palpable and equal bilaterally.          Assessment & Plan:  CARDIOVASCULAR.  Evaluated by Dr Gwen Pounds 07/10/12.  See his note for details.  Stable.  No changes made.  Doing better and feeling better.  ECHO 12/21/11 - EF 45%, mild biatrial enlargement, moderate mitral and tricuspid insufficiency and mild pulmonary hypertension.    GI.  Colonoscopy 03/15/11 revealed multiple polyps and internal hemorrhoids.  Follow up colonoscopy and EGD 4/14.  Need to obtain results.  Bowels stable.     HEALTH MAINTENANCE.  Physical 01/12/13.   Colonoscopy as outlined.  PSA 10/914 - .00

## 2013-05-14 NOTE — Assessment & Plan Note (Addendum)
History of prostate cancer.  PSA out of control (05/09/13).

## 2013-05-14 NOTE — Assessment & Plan Note (Signed)
Blood pressure doing better.  Same med regimen.  Follow metabolic panel.    

## 2013-05-14 NOTE — Assessment & Plan Note (Signed)
Unable to tolerate the mask.  Declines another trial.  Understands risk of untreated sleep apnea.  Avoid lying supine and avoid sedating medications.   

## 2013-05-14 NOTE — Assessment & Plan Note (Addendum)
Fasting lipid profile just checked and revealed total cholesterol 210, triglycerides 217, HDL 53 and LDL 127.  Low carb diet.  Follow.  Discussed with him regarding getting more serious about his diet and exercise.

## 2013-05-14 NOTE — Assessment & Plan Note (Signed)
CXR 04/13/11 - no acute abnormality.  Liver panel previously wnl.  Follow with dermatology.    

## 2013-05-20 ENCOUNTER — Encounter: Payer: Self-pay | Admitting: Internal Medicine

## 2013-05-20 NOTE — Assessment & Plan Note (Signed)
Just had EGD.  See report for details.  Due f/u 11/2015.  Currently asymptomatic.   

## 2013-05-28 ENCOUNTER — Other Ambulatory Visit (INDEPENDENT_AMBULATORY_CARE_PROVIDER_SITE_OTHER): Payer: Medicare Other

## 2013-05-28 DIAGNOSIS — I10 Essential (primary) hypertension: Secondary | ICD-10-CM

## 2013-05-28 DIAGNOSIS — R7989 Other specified abnormal findings of blood chemistry: Secondary | ICD-10-CM

## 2013-05-28 DIAGNOSIS — R945 Abnormal results of liver function studies: Secondary | ICD-10-CM

## 2013-05-28 LAB — HEPATIC FUNCTION PANEL
ALT: 52 U/L (ref 0–53)
AST: 43 U/L — ABNORMAL HIGH (ref 0–37)
Albumin: 4.1 g/dL (ref 3.5–5.2)
Alkaline Phosphatase: 46 U/L (ref 39–117)

## 2013-05-28 LAB — BASIC METABOLIC PANEL
BUN: 18 mg/dL (ref 6–23)
Chloride: 99 mEq/L (ref 96–112)
Creatinine, Ser: 1.5 mg/dL (ref 0.4–1.5)
Glucose, Bld: 113 mg/dL — ABNORMAL HIGH (ref 70–99)
Potassium: 3.8 mEq/L (ref 3.5–5.1)

## 2013-05-29 ENCOUNTER — Encounter: Payer: Self-pay | Admitting: Internal Medicine

## 2013-08-07 ENCOUNTER — Other Ambulatory Visit: Payer: Self-pay | Admitting: Internal Medicine

## 2013-08-08 NOTE — Telephone Encounter (Signed)
Refilled tramadol #60 with one refill.   

## 2013-08-08 NOTE — Telephone Encounter (Signed)
Last visit 05/14/13, refill?

## 2013-09-10 ENCOUNTER — Other Ambulatory Visit: Payer: Self-pay | Admitting: Internal Medicine

## 2013-09-18 ENCOUNTER — Ambulatory Visit: Payer: Medicare Other | Admitting: Internal Medicine

## 2013-10-08 ENCOUNTER — Other Ambulatory Visit: Payer: Self-pay | Admitting: Internal Medicine

## 2013-10-15 ENCOUNTER — Encounter: Payer: Self-pay | Admitting: Internal Medicine

## 2013-10-15 ENCOUNTER — Ambulatory Visit (INDEPENDENT_AMBULATORY_CARE_PROVIDER_SITE_OTHER): Payer: Medicare Other | Admitting: Internal Medicine

## 2013-10-15 VITALS — BP 130/70 | HR 62 | Temp 98.1°F | Ht 68.0 in | Wt 238.5 lb

## 2013-10-15 DIAGNOSIS — Z86010 Personal history of colonic polyps: Secondary | ICD-10-CM

## 2013-10-15 DIAGNOSIS — I10 Essential (primary) hypertension: Secondary | ICD-10-CM

## 2013-10-15 DIAGNOSIS — E7800 Pure hypercholesterolemia, unspecified: Secondary | ICD-10-CM

## 2013-10-15 DIAGNOSIS — G4730 Sleep apnea, unspecified: Secondary | ICD-10-CM

## 2013-10-15 DIAGNOSIS — C439 Malignant melanoma of skin, unspecified: Secondary | ICD-10-CM

## 2013-10-15 DIAGNOSIS — C61 Malignant neoplasm of prostate: Secondary | ICD-10-CM

## 2013-10-15 DIAGNOSIS — K2270 Barrett's esophagus without dysplasia: Secondary | ICD-10-CM

## 2013-10-15 MED ORDER — VALACYCLOVIR HCL 1 G PO TABS: 1000.0000 mg | ORAL_TABLET | Freq: Every day | ORAL | Status: DC

## 2013-10-15 NOTE — Progress Notes (Signed)
Subjective:    [name], male    DOB: [birth year], 79 y.o.   MRN: [id number]  HPI 79 year old male with past history of prostate cancer s/p retropubic prostatectomy, hypertension, gastritis and known sleep apnea.  He comes in today for a scheduled follow up.   Breathing is stable.  No active cardiac symptoms.  Still not able to use CPAP.   Bowels stable.  Blood pressure averaging 120's-140/60-80.  Overall he feels he is doing relatively well.  No increased heart rate or palpitations.  Still with back pain.  Stable.  He is not watching his diet and not exercising like he was.  Has gained his weight back.  Discussed again with him today.  Plans to restart.     Past Medical History  Diagnosis Date  . Arthritis   . Cancer     prostate,skin  . Chicken pox   . Diverticulitis   . Ulcer   . Hypertension   . Sleep apnea   . Hyperlipidemia   . Melanoma     Malignant resection  . Anemia   . GERD (gastroesophageal reflux disease)     Current Outpatient Prescriptions on File Prior to Visit  Medication Sig Dispense Refill  . acetaminophen (TYLENOL) 500 MG tablet Take 500 mg by mouth every 6 (six) hours as needed.      Marland Kitchen amLODipine (NORVASC) 5 MG tablet TAKE ONE (1) TABLET EACH DAY  30 tablet  5  . aspirin 81 MG tablet Take 81 mg by mouth daily.      Sarajane Marek Sodium 30-100 MG CAPS Take by mouth.      . hydrochlorothiazide (HYDRODIURIL) 25 MG tablet TAKE ONE (1) TABLET EACH DAY  30 tablet  5  . hyoscyamine (LEVSIN, ANASPAZ) 0.125 MG tablet Take 0.125 mg by mouth as needed.      . irbesartan (AVAPRO) 300 MG tablet TAKE ONE (1) TABLET EACH DAY  30 tablet  5  . L-Lysine 500 MG TABS Take by mouth daily.      Marland Kitchen levothyroxine (SYNTHROID, LEVOTHROID) 75 MCG tablet TAKE ONE (1) TABLET EACH DAY  90 tablet  1  . omeprazole (PRILOSEC) 20 MG capsule TAKE ONE CAPSULE TWICE A DAY  60 capsule  5  . traMADol (ULTRAM) 50 MG tablet TAKE ONE TABLET TWICE DAILY AS NEEDED  60 tablet  1   . valACYclovir (VALTREX) 1000 MG tablet Take 1,000 mg by mouth daily.       No current facility-administered medications on file prior to visit.    Review of Systems Patient denies any headache, lightheadedness or dizziness.  No sinus or allergy symptoms.  No chest pain, tightness or palpitations.  No increased shortness of breath, cough or congestion.  Breathing stable.  Tries to stay active.  No nausea or vomiting.  No increased abdominal pain or cramping.  States his abdomen feels better.  No bowel change, such as diarrhea, constipation, BRBPR or melana.  No urine change.   Not watching his diet.  Blood pressures as outlined.      Objective:   Physical Exam  Filed Vitals:   10/15/13 1558  BP: 130/70  Pulse: 62  Temp: 98.1 F (7.2 C)   79 year old male in no acute distress.  HEENT:  Nares - clear.  Oropharynx - without lesions. NECK:  Supple.  Nontender.  No audible carotid bruit.  HEART:  Appears to be regular.   LUNGS:  No crackles or wheezing audible.  Respirations even and unlabored.   RADIAL PULSE:  Equal bilaterally.  ABDOMEN:  Soft.  Nontender.  Bowel sounds present and normal.  No audible abdominal bruit.   EXTREMITIES:  No increased edema present.  DP pulses palpable and equal bilaterally.          Assessment & Plan:  CARDIOVASCULAR.  Evaluated by Dr Nehemiah Massed 07/10/12.  See his note for details.  Stable.  No changes made.  Doing better and feeling better.  ECHO 12/21/11 - EF 45%, mild biatrial enlargement, moderate mitral and tricuspid insufficiency and mild pulmonary hypertension.    GI.  Colonoscopy 03/15/11 revealed multiple polyps and internal hemorrhoids.  Follow up colonoscopy and EGD 4/14.  Need to obtain results.  Bowels stable.     HEALTH MAINTENANCE.  Physical 01/12/13.   Colonoscopy as outlined.  PSA 10/914 - .00

## 2013-10-15 NOTE — Progress Notes (Signed)
Pre-visit discussion using our clinic review tool. No additional management support is needed unless otherwise documented below in the visit note.  

## 2013-10-20 ENCOUNTER — Encounter: Payer: Self-pay | Admitting: Internal Medicine

## 2013-10-20 NOTE — Assessment & Plan Note (Signed)
Colonoscopy 8/12 as outlined.  Had f/u colonoscopy and EGD 4/14.  Results as outlined.  Recommended f/u colonoscopy 10/2017.  Currently doing well.  Follow.    

## 2013-10-20 NOTE — Assessment & Plan Note (Signed)
Unable to tolerate the mask.  Declines another trial.  Understands risk of untreated sleep apnea.  Avoid lying supine and avoid sedating medications.   

## 2013-10-20 NOTE — Assessment & Plan Note (Signed)
Low carb diet.  Follow.  Discussed with him regarding getting more serious about his diet and exercise.    

## 2013-10-20 NOTE — Assessment & Plan Note (Signed)
CXR 04/13/11 - no acute abnormality.  Liver panel previously wnl.  Follow with dermatology.

## 2013-10-20 NOTE — Assessment & Plan Note (Signed)
History of prostate cancer.  PSA 0.0 (05/09/13).    

## 2013-10-20 NOTE — Assessment & Plan Note (Signed)
Blood pressure doing better.  Same med regimen.  Follow metabolic panel.

## 2013-10-20 NOTE — Assessment & Plan Note (Signed)
Just had EGD.  See report for details.  Due f/u 11/2015.  Currently asymptomatic.   

## 2013-10-22 ENCOUNTER — Other Ambulatory Visit (INDEPENDENT_AMBULATORY_CARE_PROVIDER_SITE_OTHER): Payer: Medicare Other

## 2013-10-22 DIAGNOSIS — E7800 Pure hypercholesterolemia, unspecified: Secondary | ICD-10-CM

## 2013-10-22 DIAGNOSIS — C61 Malignant neoplasm of prostate: Secondary | ICD-10-CM

## 2013-10-22 DIAGNOSIS — G4730 Sleep apnea, unspecified: Secondary | ICD-10-CM

## 2013-10-22 DIAGNOSIS — Z8546 Personal history of malignant neoplasm of prostate: Secondary | ICD-10-CM

## 2013-10-22 DIAGNOSIS — I10 Essential (primary) hypertension: Secondary | ICD-10-CM

## 2013-10-22 LAB — COMPREHENSIVE METABOLIC PANEL
ALBUMIN: 4.1 g/dL (ref 3.5–5.2)
ALT: 53 U/L (ref 0–53)
AST: 38 U/L — ABNORMAL HIGH (ref 0–37)
Alkaline Phosphatase: 57 U/L (ref 39–117)
BUN: 18 mg/dL (ref 6–23)
CO2: 31 mEq/L (ref 19–32)
Calcium: 9.7 mg/dL (ref 8.4–10.5)
Chloride: 102 mEq/L (ref 96–112)
Creatinine, Ser: 1.2 mg/dL (ref 0.4–1.5)
GFR: 60.75 mL/min (ref >60.00)
Glucose, Bld: 102 mg/dL — ABNORMAL HIGH (ref 70–99)
Potassium: 4 mEq/L (ref 3.5–5.1)
Sodium: 140 mEq/L (ref 135–145)
Total Bilirubin: 0.7 mg/dL (ref 0.3–1.2)
Total Protein: 6.9 g/dL (ref 6.0–8.3)

## 2013-10-22 LAB — LIPID PANEL
CHOLESTEROL: 195 mg/dL (ref 0–200)
HDL: 52.9 mg/dL (ref >39.00)
LDL CALC: 98 mg/dL (ref 0–99)
Total CHOL/HDL Ratio: 4
Triglycerides: 222 mg/dL — ABNORMAL HIGH (ref 0.0–149.0)
VLDL: 44.4 mg/dL — ABNORMAL HIGH (ref 0.0–40.0)

## 2013-10-22 LAB — TSH: TSH: 4.53 u[IU]/mL (ref 0.35–5.50)

## 2013-10-22 LAB — PSA, MEDICARE: PSA: 0 ng/mL — AB (ref 0.10–4.00)

## 2013-10-23 ENCOUNTER — Encounter: Payer: Self-pay | Admitting: Internal Medicine

## 2013-10-31 ENCOUNTER — Ambulatory Visit: Payer: Medicare Other | Admitting: Internal Medicine

## 2014-01-07 ENCOUNTER — Other Ambulatory Visit: Payer: Self-pay | Admitting: Internal Medicine

## 2014-01-07 NOTE — Telephone Encounter (Signed)
Ok refill? 

## 2014-01-07 NOTE — Telephone Encounter (Signed)
Refilled valtrex #30 with one refill and tramadol #60 with one refill.

## 2014-01-07 NOTE — Telephone Encounter (Signed)
Rx faxed to pharmacy  

## 2014-01-16 ENCOUNTER — Encounter: Payer: Medicare Other | Admitting: Internal Medicine

## 2014-02-13 ENCOUNTER — Ambulatory Visit: Payer: Self-pay | Admitting: Ophthalmology

## 2014-02-13 HISTORY — PX: OTHER SURGICAL HISTORY: SHX169

## 2014-02-18 ENCOUNTER — Ambulatory Visit (INDEPENDENT_AMBULATORY_CARE_PROVIDER_SITE_OTHER): Payer: Medicare Other | Admitting: Internal Medicine

## 2014-02-18 ENCOUNTER — Encounter: Payer: Self-pay | Admitting: Internal Medicine

## 2014-02-18 VITALS — BP 132/70 | HR 59 | Temp 97.8°F | Ht 68.75 in | Wt 238.2 lb

## 2014-02-18 DIAGNOSIS — K2270 Barrett's esophagus without dysplasia: Secondary | ICD-10-CM

## 2014-02-18 DIAGNOSIS — E7800 Pure hypercholesterolemia, unspecified: Secondary | ICD-10-CM

## 2014-02-18 DIAGNOSIS — C439 Malignant melanoma of skin, unspecified: Secondary | ICD-10-CM

## 2014-02-18 DIAGNOSIS — E669 Obesity, unspecified: Secondary | ICD-10-CM

## 2014-02-18 DIAGNOSIS — E039 Hypothyroidism, unspecified: Secondary | ICD-10-CM

## 2014-02-18 DIAGNOSIS — I10 Essential (primary) hypertension: Secondary | ICD-10-CM

## 2014-02-18 DIAGNOSIS — G4730 Sleep apnea, unspecified: Secondary | ICD-10-CM

## 2014-02-18 DIAGNOSIS — C61 Malignant neoplasm of prostate: Secondary | ICD-10-CM

## 2014-02-18 DIAGNOSIS — Z86010 Personal history of colonic polyps: Secondary | ICD-10-CM

## 2014-02-18 NOTE — Progress Notes (Signed)
Pre visit review using our clinic review tool, if applicable. No additional management support is needed unless otherwise documented below in the visit note. 

## 2014-02-23 ENCOUNTER — Encounter: Payer: Self-pay | Admitting: Internal Medicine

## 2014-02-23 DIAGNOSIS — Z6837 Body mass index (BMI) 37.0-37.9, adult: Secondary | ICD-10-CM | POA: Insufficient documentation

## 2014-02-23 NOTE — Assessment & Plan Note (Signed)
Just had EGD.  See report for details.  Due f/u 11/2015.  Currently asymptomatic.

## 2014-02-23 NOTE — Assessment & Plan Note (Signed)
Colonoscopy 8/12 as outlined.  Had f/u colonoscopy and EGD 4/14.  Results as outlined.  Recommended f/u colonoscopy 10/2017.  Currently doing well.  Follow.

## 2014-02-23 NOTE — Assessment & Plan Note (Signed)
CXR 04/13/11 - no acute abnormality.  Liver panel previously wnl.  Follow with dermatology.   His dermatologist no longer takes his insurance.  Will need to schedule with another dermatology.

## 2014-02-23 NOTE — Assessment & Plan Note (Signed)
Blood pressure doing well.  Same med regimen.  Follow metabolic panel.

## 2014-02-23 NOTE — Assessment & Plan Note (Signed)
History of prostate cancer.  PSA 0.0 (05/09/13).

## 2014-02-23 NOTE — Progress Notes (Signed)
Subjective:    [name], male    DOB: [birth year], 79 y.o.   MRN: [id number]  HPI 79 year old male with past history of prostate cancer s/p retropubic prostatectomy, hypertension, gastritis and known sleep apnea.  He comes in today for a scheduled follow up.   Breathing is stable.  No active cardiac symptoms.  Not able to use CPAP.   Bowels stable.  Blood pressure averaging 120's-140/60-80.  Overall he feels he is doing relatively well.  No increased heart rate or palpitations.  Still with back pain.  Stable.  Abdominal pain better.  He is still not watching his diet and not exercising like he was.  Discussed again with him today.  Plans to restart.     Past Medical History  Diagnosis Date  . Arthritis   . Cancer     prostate,skin  . Chicken pox   . Diverticulitis   . Ulcer   . Hypertension   . Sleep apnea   . Hyperlipidemia   . Melanoma     Malignant resection  . Anemia   . GERD (gastroesophageal reflux disease)     Current Outpatient Prescriptions on File Prior to Visit  Medication Sig Dispense Refill  . acetaminophen (TYLENOL) 500 MG tablet Take 500 mg by mouth every 6 (six) hours as needed.      Marland Kitchen amLODipine (NORVASC) 5 MG tablet TAKE ONE (1) TABLET EACH DAY  30 tablet  5  . aspirin 81 MG tablet Take 81 mg by mouth daily.      Sarajane Marek Sodium 30-100 MG CAPS Take by mouth.      . hydrochlorothiazide (HYDRODIURIL) 25 MG tablet TAKE ONE (1) TABLET EACH DAY  30 tablet  5  . hyoscyamine (LEVSIN, ANASPAZ) 0.125 MG tablet Take 0.125 mg by mouth as needed.      . irbesartan (AVAPRO) 300 MG tablet TAKE ONE (1) TABLET EACH DAY  30 tablet  5  . L-Lysine 500 MG TABS Take by mouth daily.      Marland Kitchen levothyroxine (SYNTHROID, LEVOTHROID) 75 MCG tablet TAKE ONE (1) TABLET EACH DAY  90 tablet  1  . omeprazole (PRILOSEC) 20 MG capsule TAKE ONE CAPSULE TWICE A DAY  60 capsule  5  . traMADol (ULTRAM) 50 MG tablet TAKE ONE TABLET TWICE DAILY AS NEEDED  60 tablet  1  .  valACYclovir (VALTREX) 1000 MG tablet TAKE 1 TABLET BY MOUTH EVERY DAY.  30 tablet  1   No current facility-administered medications on file prior to visit.    Review of Systems Patient denies any headache, lightheadedness or dizziness.  No sinus or allergy symptoms.  No chest pain, tightness or palpitations.  No increased shortness of breath, cough or congestion.  Breathing stable.  Tries to stay active.  No nausea or vomiting.  No increased abdominal pain or cramping.  States his abdomen feels better.  No bowel change, such as diarrhea, constipation, BRBPR or melana.  No urine change.   Not watching his diet.  Blood pressures as outlined.      Objective:   Physical Exam  Filed Vitals:   02/18/14 1320  BP: 132/70  Pulse: 59  Temp: 97.8 F (69.44 C)   79 year old male in no acute distress.  HEENT:  Nares - clear.  Oropharynx - without lesions. NECK:  Supple.  Nontender.  No audible carotid bruit.  HEART:  Appears to be regular.   LUNGS:  No crackles or wheezing  audible.  Respirations even and unlabored.   RADIAL PULSE:  Equal bilaterally.  ABDOMEN:  Soft.  Nontender.  Bowel sounds present and normal.  No audible abdominal bruit.   EXTREMITIES:  No increased edema present.  DP pulses palpable and equal bilaterally.          Assessment & Plan:  CARDIOVASCULAR.  Evaluated by Dr Nehemiah Massed 07/10/12.  See his note for details.  Stable.  No changes made.  Doing better and feeling better.  ECHO 12/21/11 - EF 45%, mild biatrial enlargement, moderate mitral and tricuspid insufficiency and mild pulmonary hypertension.    GI.  Colonoscopy 03/15/11 revealed multiple polyps and internal hemorrhoids.  Follow up colonoscopy and EGD 4/14.  Bowels stable.     HEALTH MAINTENANCE.  Physical 01/12/13.   Colonoscopy as outlined.  PSA 10/914 - .00

## 2014-02-23 NOTE — Assessment & Plan Note (Signed)
Low carb diet.  Follow.  Discussed with him regarding getting more serious about his diet and exercise.

## 2014-02-23 NOTE — Assessment & Plan Note (Signed)
Unable to tolerate the mask.  Declines another trial.  Understands risk of untreated sleep apnea.  Avoid lying supine and avoid sedating medications.

## 2014-02-23 NOTE — Assessment & Plan Note (Signed)
Weight loss diet and exercise.  Follow.

## 2014-02-27 ENCOUNTER — Other Ambulatory Visit (INDEPENDENT_AMBULATORY_CARE_PROVIDER_SITE_OTHER): Payer: Medicare Other

## 2014-02-27 DIAGNOSIS — E039 Hypothyroidism, unspecified: Secondary | ICD-10-CM

## 2014-02-27 DIAGNOSIS — C439 Malignant melanoma of skin, unspecified: Secondary | ICD-10-CM

## 2014-02-27 DIAGNOSIS — E7800 Pure hypercholesterolemia, unspecified: Secondary | ICD-10-CM

## 2014-02-27 DIAGNOSIS — I10 Essential (primary) hypertension: Secondary | ICD-10-CM

## 2014-02-27 LAB — CBC WITH DIFFERENTIAL/PLATELET
BASOS ABS: 0 10*3/uL (ref 0.0–0.1)
Basophils Relative: 0.3 % (ref 0.0–3.0)
EOS ABS: 0.1 10*3/uL (ref 0.0–0.7)
Eosinophils Relative: 2.1 % (ref 0.0–5.0)
HEMATOCRIT: 44.3 % (ref 39.0–52.0)
HEMOGLOBIN: 15.1 g/dL (ref 13.0–17.0)
LYMPHS ABS: 2.4 10*3/uL (ref 0.7–4.0)
Lymphocytes Relative: 39.3 % (ref 12.0–46.0)
MCHC: 34 g/dL (ref 30.0–36.0)
MCV: 92.4 fl (ref 78.0–100.0)
MONO ABS: 0.7 10*3/uL (ref 0.1–1.0)
Monocytes Relative: 11.4 % (ref 3.0–12.0)
Neutro Abs: 2.9 10*3/uL (ref 1.4–7.7)
Neutrophils Relative %: 46.9 % (ref 43.0–77.0)
PLATELETS: 196 10*3/uL (ref 150.0–400.0)
RBC: 4.79 Mil/uL (ref 4.22–5.81)
RDW: 13.2 % (ref 11.5–15.5)
WBC: 6.2 10*3/uL (ref 4.0–10.5)

## 2014-02-27 LAB — LIPID PANEL
CHOLESTEROL: 186 mg/dL (ref 0–200)
HDL: 52.8 mg/dL (ref >39.00)
LDL CALC: 104 mg/dL — AB (ref 0–99)
NonHDL: 133.2
TRIGLYCERIDES: 144 mg/dL (ref 0.0–149.0)
Total CHOL/HDL Ratio: 4
VLDL: 28.8 mg/dL (ref 0.0–40.0)

## 2014-02-27 LAB — HEPATIC FUNCTION PANEL
ALT: 43 U/L (ref 0–53)
AST: 34 U/L (ref 0–37)
Albumin: 3.8 g/dL (ref 3.5–5.2)
Alkaline Phosphatase: 57 U/L (ref 39–117)
BILIRUBIN TOTAL: 0.6 mg/dL (ref 0.2–1.2)
Bilirubin, Direct: 0.1 mg/dL (ref 0.0–0.3)
Total Protein: 6.9 g/dL (ref 6.0–8.3)

## 2014-02-27 LAB — BASIC METABOLIC PANEL
BUN: 16 mg/dL (ref 6–23)
CALCIUM: 9.4 mg/dL (ref 8.4–10.5)
CO2: 29 meq/L (ref 19–32)
Chloride: 104 mEq/L (ref 96–112)
Creatinine, Ser: 1.4 mg/dL (ref 0.4–1.5)
GFR: 54.05 mL/min — ABNORMAL LOW (ref >60.00)
GLUCOSE: 103 mg/dL — AB (ref 70–99)
POTASSIUM: 4.1 meq/L (ref 3.5–5.1)
SODIUM: 142 meq/L (ref 135–145)

## 2014-02-27 LAB — TSH: TSH: 4.58 u[IU]/mL — ABNORMAL HIGH (ref 0.35–4.50)

## 2014-02-28 ENCOUNTER — Other Ambulatory Visit: Payer: Self-pay | Admitting: *Deleted

## 2014-02-28 ENCOUNTER — Other Ambulatory Visit: Payer: Self-pay | Admitting: Internal Medicine

## 2014-02-28 DIAGNOSIS — E039 Hypothyroidism, unspecified: Secondary | ICD-10-CM

## 2014-02-28 MED ORDER — LEVOTHYROXINE SODIUM 88 MCG PO TABS: 88.0000 ug | ORAL_TABLET | Freq: Every day | ORAL | Status: DC

## 2014-02-28 NOTE — Progress Notes (Signed)
Order placed for tsh 

## 2014-02-28 NOTE — Progress Notes (Signed)
D/c'd 24mcg of synthroid.  Prescription sent in for 36mcg synthroid.

## 2014-02-28 NOTE — Progress Notes (Signed)
Pt scheduled and aware  of appointment.

## 2014-03-11 ENCOUNTER — Other Ambulatory Visit: Payer: Self-pay | Admitting: Internal Medicine

## 2014-04-03 ENCOUNTER — Encounter: Payer: Medicare Other | Admitting: Adult Health

## 2014-04-04 ENCOUNTER — Encounter: Payer: Medicare Other | Admitting: Adult Health

## 2014-04-09 ENCOUNTER — Other Ambulatory Visit: Payer: Self-pay | Admitting: Internal Medicine

## 2014-04-09 ENCOUNTER — Other Ambulatory Visit (INDEPENDENT_AMBULATORY_CARE_PROVIDER_SITE_OTHER): Payer: Medicare Other

## 2014-04-09 DIAGNOSIS — E039 Hypothyroidism, unspecified: Secondary | ICD-10-CM

## 2014-04-09 LAB — TSH: TSH: 3.64 u[IU]/mL (ref 0.35–4.50)

## 2014-04-10 ENCOUNTER — Encounter: Payer: Self-pay | Admitting: *Deleted

## 2014-04-10 ENCOUNTER — Encounter: Payer: Self-pay | Admitting: Internal Medicine

## 2014-04-10 ENCOUNTER — Other Ambulatory Visit: Payer: Self-pay | Admitting: Internal Medicine

## 2014-04-10 DIAGNOSIS — E7800 Pure hypercholesterolemia, unspecified: Secondary | ICD-10-CM

## 2014-04-10 DIAGNOSIS — E039 Hypothyroidism, unspecified: Secondary | ICD-10-CM

## 2014-04-10 DIAGNOSIS — C61 Malignant neoplasm of prostate: Secondary | ICD-10-CM

## 2014-04-10 DIAGNOSIS — I10 Essential (primary) hypertension: Secondary | ICD-10-CM

## 2014-04-10 NOTE — Progress Notes (Signed)
Orders placed for f/u labs.  

## 2014-04-16 ENCOUNTER — Encounter: Payer: Medicare Other | Admitting: Adult Health

## 2014-04-17 ENCOUNTER — Encounter: Payer: Self-pay | Admitting: Adult Health

## 2014-04-17 ENCOUNTER — Ambulatory Visit (INDEPENDENT_AMBULATORY_CARE_PROVIDER_SITE_OTHER): Payer: Medicare Other | Admitting: Adult Health

## 2014-04-17 DIAGNOSIS — Z0000 Encounter for general adult medical examination without abnormal findings: Secondary | ICD-10-CM

## 2014-04-17 DIAGNOSIS — Z23 Encounter for immunization: Secondary | ICD-10-CM

## 2014-04-17 NOTE — Addendum Note (Signed)
Addended by: Geni Bers on: 04/17/2014 01:27 PM   Modules accepted: Orders

## 2014-04-17 NOTE — Progress Notes (Signed)
Pre visit review using our clinic review tool, if applicable. No additional management support is needed unless otherwise documented below in the visit note. 

## 2014-04-17 NOTE — Progress Notes (Signed)
Subjective:    Hector [name] is a 79 y.o. male who presents for Medicare Annual/Subsequent preventive examination.   Preventive Screening-Counseling & Management  Tobacco History  Smoking status  . Never Smoker   Smokeless tobacco  . Never Used    Problems Prior to Visit 1.   Current Problems (verified) Patient Active Problem List   Diagnosis Date Noted  . Obesity (BMI 30-39.9) 02/23/2014  . Barrett's esophagus 12/07/2012  . Personal history of colonic polyps 10/01/2012  . Sleep apnea 08/13/2012  . Prostate cancer 08/13/2012  . Malignant melanoma 08/13/2012  . Hypertension 08/10/2012  . Hypercholesterolemia 08/10/2012    Medications Prior to Visit Current Outpatient Prescriptions on File Prior to Visit  Medication Sig Dispense Refill  . acetaminophen (TYLENOL) 500 MG tablet Take 500 mg by mouth every 6 (six) hours as needed.      Marland Kitchen amLODipine (NORVASC) 5 MG tablet TAKE ONE (1) TABLET EACH DAY  30 tablet  6  . aspirin 81 MG tablet Take 81 mg by mouth daily.      Sarajane Marek Sodium 30-100 MG CAPS Take by mouth.      . hydrochlorothiazide (HYDRODIURIL) 25 MG tablet TAKE ONE (1) TABLET EACH DAY  30 tablet  6  . hyoscyamine (LEVSIN, ANASPAZ) 0.125 MG tablet Take 0.125 mg by mouth as needed.      . irbesartan (AVAPRO) 300 MG tablet TAKE ONE (1) TABLET EACH DAY  30 tablet  10  . L-Lysine 500 MG TABS Take by mouth daily.      Marland Kitchen levothyroxine (SYNTHROID, LEVOTHROID) 88 MCG tablet Take 1 tablet (88 mcg total) by mouth daily.  30 tablet  3  . omeprazole (PRILOSEC) 20 MG capsule TAKE ONE CAPSULE TWICE A DAY  60 capsule  6  . traMADol (ULTRAM) 50 MG tablet TAKE ONE TABLET TWICE DAILY AS NEEDED  60 tablet  1  . valACYclovir (VALTREX) 1000 MG tablet TAKE 1 TABLET BY MOUTH EVERY DAY.  30 tablet  1   No current facility-administered medications on file prior to visit.    Current Medications (verified) Current Outpatient Prescriptions  Medication Sig Dispense Refill  .  acetaminophen (TYLENOL) 500 MG tablet Take 500 mg by mouth every 6 (six) hours as needed.      Marland Kitchen amLODipine (NORVASC) 5 MG tablet TAKE ONE (1) TABLET EACH DAY  30 tablet  6  . aspirin 81 MG tablet Take 81 mg by mouth daily.      Sarajane Marek Sodium 30-100 MG CAPS Take by mouth.      . hydrochlorothiazide (HYDRODIURIL) 25 MG tablet TAKE ONE (1) TABLET EACH DAY  30 tablet  6  . hyoscyamine (LEVSIN, ANASPAZ) 0.125 MG tablet Take 0.125 mg by mouth as needed.      . irbesartan (AVAPRO) 300 MG tablet TAKE ONE (1) TABLET EACH DAY  30 tablet  10  . L-Lysine 500 MG TABS Take by mouth daily.      Marland Kitchen levothyroxine (SYNTHROID, LEVOTHROID) 88 MCG tablet Take 1 tablet (88 mcg total) by mouth daily.  30 tablet  3  . omeprazole (PRILOSEC) 20 MG capsule TAKE ONE CAPSULE TWICE A DAY  60 capsule  6  . traMADol (ULTRAM) 50 MG tablet TAKE ONE TABLET TWICE DAILY AS NEEDED  60 tablet  1  . valACYclovir (VALTREX) 1000 MG tablet TAKE 1 TABLET BY MOUTH EVERY DAY.  30 tablet  1   No current facility-administered medications for this visit.  Allergies (verified) Tape   PAST HISTORY  Family History Family History  Problem Relation Age of Onset  . Heart disease Father   . Kidney disease Father     Social History History  Substance Use Topics  . Smoking status: Never Smoker   . Smokeless tobacco: Never Used  . Alcohol Use: No    Are there smokers in your home (other than you)?  No  Risk Factors Current exercise habits: Pt stays very busy around the house  Dietary issues discussed: "No picky with food". Tries to eat fruits, vegetables.   Cardiac risk factors: advanced age (older than 53 for men, 84 for women), hypertension, male gender and obesity (BMI >= 30 kg/m2).  Depression Screen (Note: if answer to either of the following is "Yes", a more complete depression screening is indicated)   Q1: Over the past two weeks, have you felt down, depressed or hopeless? No  Q2: Over the past two  weeks, have you felt little interest or pleasure in doing things? No  Have you lost interest or pleasure in daily life? No  Do you often feel hopeless? No  Do you cry easily over simple problems? No  Activities of Daily Living In your present state of health, do you have any difficulty performing the following activities?:  Driving? Yes Managing money?  No Feeding yourself? No Getting from bed to chair? No Climbing a flight of stairs? No Preparing food and eating?: No Bathing or showering? No Getting dressed: No Getting to the toilet? No Using the toilet:No Moving around from place to place: No In the past year have you fallen or had a near fall?:No   Are you sexually active?  No  Do you have more than one partner?  No  Hearing Difficulties: No Do you often ask people to speak up or repeat themselves? No Do you experience ringing or noises in your ears? No Do you have difficulty understanding soft or whispered voices? No   Do you feel that you have a problem with memory? No  Do you often misplace items? No  Do you feel safe at home?  Yes  Cognitive Testing  Alert? Yes  Normal Appearance?Yes  Oriented to person? Yes  Place? Yes   Time? Yes  Recall of three objects?  Yes  Can perform simple calculations? Yes  Displays appropriate judgment?Yes  Can read the correct time from a watch face?Yes   Advanced Directives have been discussed with the patient? Yes   List the Names of Other Physician/Practitioners you currently use: 1.  Dr. Phillip Heal - Dermatology 2.  Dr. Tsosie Billing - Ophthalmology  Indicate any recent Medical Services you may have received from other than [facility] providers in the past year (date may be approximate).  Immunization History  Administered Date(s) Administered  . Influenza Split 05/03/2012  . Zoster 05/02/2010    Screening Tests Health Maintenance  Topic Date Due  . Tetanus/tdap  06/07/1956  . Pneumococcal Polysaccharide Vaccine Age 56 And Over   06/07/2002  . Influenza Vaccine  03/02/2014  . Colonoscopy  11/29/2022  . Zostavax  Completed    All answers were reviewed with the patient and necessary referrals were made:  Hector Hollibaugh, NP   04/17/2014   History reviewed: allergies, current medications, past family history, past medical history, past social history, past surgical history and problem list  Review of Systems no ros. medicare wellness    Objective:     Vision by Snellen chart: right TIW:PYKDXIP declines measurement,  left OMV:EHMCNOB declines measurement There were no vitals taken for this visit. There is no weight on file to calculate BMI.  No exam performed today, medicare wellness.     Assessment:     Patient presents for yearly preventative medicine examination. Medicare questionnaire was completed  All immunizations and health maintenance protocols were reviewed with the patient and needed orders were placed.  Appropriate screening laboratory values were ordered for the patient including screening of hyperlipidemia, renal function and hepatic function. If indicated by BPH, a PSA was ordered.  Medication reconciliation,  past medical history, social history, problem list and allergies were reviewed in detail with the patient  Goals were established with regard to weight loss, exercise, and  diet in compliance with medications  End of life planning was discussed.       Plan:     During the course of the visit the patient was educated and counseled about appropriate screening and preventive services including:    Pneumococcal vaccine   Influenza vaccine  Td vaccine  Diabetes screening  Advanced directives: has an advanced directive - a copy HAS NOT been provided.  Pt will receive the Prevnar vaccine and td vaccine. He will return for his flu vaccine. Labs to check for diabetes ordered  Diet review for nutrition referral? Yes ____  Not Indicated ___x_   Patient Instructions (the written  plan) was given to the patient.  Medicare Attestation I have personally reviewed: The patient's medical and social history Their use of alcohol, tobacco or illicit drugs Their current medications and supplements The patient's functional ability including ADLs,fall risks, home safety risks, cognitive, and hearing and visual impairment Diet and physical activities Evidence for depression or mood disorders  The patient's weight, height, BMI, and visual acuity have been recorded in the chart.  I have made referrals, counseling, and provided education to the patient based on review of the above and I have provided the patient with a written personalized care plan for preventive services.     Kathye Cipriani, NP   04/17/2014

## 2014-05-07 ENCOUNTER — Other Ambulatory Visit: Payer: Self-pay | Admitting: Internal Medicine

## 2014-05-07 NOTE — Telephone Encounter (Signed)
Ok to refill 

## 2014-05-07 NOTE — Telephone Encounter (Signed)
Refilled valtrex #30 with one refill.

## 2014-05-31 ENCOUNTER — Encounter: Payer: Self-pay | Admitting: Internal Medicine

## 2014-05-31 MED ORDER — FLUTICASONE PROPIONATE 50 MCG/ACT NA SUSP: 2.0000 | Freq: Every day | NASAL | Status: DC

## 2014-05-31 NOTE — Telephone Encounter (Signed)
rx sent in for flonase.  See my chart message.

## 2014-06-10 ENCOUNTER — Other Ambulatory Visit: Payer: Self-pay

## 2014-06-10 MED ORDER — LEVOTHYROXINE SODIUM 88 MCG PO TABS: 88.0000 ug | ORAL_TABLET | Freq: Every day | ORAL | Status: DC

## 2014-06-20 ENCOUNTER — Other Ambulatory Visit (INDEPENDENT_AMBULATORY_CARE_PROVIDER_SITE_OTHER): Payer: Medicare Other

## 2014-06-20 DIAGNOSIS — E039 Hypothyroidism, unspecified: Secondary | ICD-10-CM

## 2014-06-20 DIAGNOSIS — E7800 Pure hypercholesterolemia, unspecified: Secondary | ICD-10-CM

## 2014-06-20 DIAGNOSIS — Z125 Encounter for screening for malignant neoplasm of prostate: Secondary | ICD-10-CM

## 2014-06-20 DIAGNOSIS — C61 Malignant neoplasm of prostate: Secondary | ICD-10-CM

## 2014-06-20 DIAGNOSIS — I10 Essential (primary) hypertension: Secondary | ICD-10-CM

## 2014-06-20 LAB — HEPATIC FUNCTION PANEL
ALBUMIN: 4 g/dL (ref 3.5–5.2)
ALT: 40 U/L (ref 0–53)
AST: 26 U/L (ref 0–37)
Alkaline Phosphatase: 59 U/L (ref 39–117)
Bilirubin, Direct: 0.1 mg/dL (ref 0.0–0.3)
TOTAL PROTEIN: 7 g/dL (ref 6.0–8.3)
Total Bilirubin: 0.7 mg/dL (ref 0.2–1.2)

## 2014-06-20 LAB — LIPID PANEL
Cholesterol: 200 mg/dL (ref 0–200)
HDL: 55.9 mg/dL (ref >39.00)
LDL CALC: 106 mg/dL — AB (ref 0–99)
NONHDL: 144.1
TRIGLYCERIDES: 191 mg/dL — AB (ref 0.0–149.0)
Total CHOL/HDL Ratio: 4
VLDL: 38.2 mg/dL (ref 0.0–40.0)

## 2014-06-20 LAB — TSH: TSH: 2.57 u[IU]/mL (ref 0.35–4.50)

## 2014-06-20 LAB — BASIC METABOLIC PANEL
BUN: 15 mg/dL (ref 6–23)
CHLORIDE: 102 meq/L (ref 96–112)
CO2: 31 meq/L (ref 19–32)
Calcium: 9.6 mg/dL (ref 8.4–10.5)
Creatinine, Ser: 1.3 mg/dL (ref 0.4–1.5)
GFR: 57.92 mL/min — ABNORMAL LOW (ref >60.00)
Glucose, Bld: 96 mg/dL (ref 70–99)
POTASSIUM: 4 meq/L (ref 3.5–5.1)
Sodium: 140 mEq/L (ref 135–145)

## 2014-06-20 LAB — PSA, MEDICARE: PSA: 0.01 ng/mL — AB (ref 0.10–4.00)

## 2014-06-21 ENCOUNTER — Encounter: Payer: Self-pay | Admitting: Internal Medicine

## 2014-06-24 ENCOUNTER — Encounter: Payer: Self-pay | Admitting: Internal Medicine

## 2014-06-24 ENCOUNTER — Ambulatory Visit (INDEPENDENT_AMBULATORY_CARE_PROVIDER_SITE_OTHER): Payer: Medicare Other | Admitting: Internal Medicine

## 2014-06-24 VITALS — BP 130/80 | HR 61 | Temp 98.1°F | Ht 68.75 in | Wt 243.2 lb

## 2014-06-24 DIAGNOSIS — E7800 Pure hypercholesterolemia, unspecified: Secondary | ICD-10-CM

## 2014-06-24 DIAGNOSIS — G4730 Sleep apnea, unspecified: Secondary | ICD-10-CM

## 2014-06-24 DIAGNOSIS — Z860100 Personal history of colon polyps, unspecified: Secondary | ICD-10-CM

## 2014-06-24 DIAGNOSIS — C61 Malignant neoplasm of prostate: Secondary | ICD-10-CM

## 2014-06-24 DIAGNOSIS — E669 Obesity, unspecified: Secondary | ICD-10-CM

## 2014-06-24 DIAGNOSIS — K2270 Barrett's esophagus without dysplasia: Secondary | ICD-10-CM

## 2014-06-24 DIAGNOSIS — C439 Malignant melanoma of skin, unspecified: Secondary | ICD-10-CM

## 2014-06-24 DIAGNOSIS — I10 Essential (primary) hypertension: Secondary | ICD-10-CM

## 2014-06-24 NOTE — Progress Notes (Signed)
Pre visit review using our clinic review tool, if applicable. No additional management support is needed unless otherwise documented below in the visit note. 

## 2014-06-24 NOTE — Progress Notes (Signed)
Subjective:    [name], male    DOB: 07-07-1935, 79 y.o.   MRN: [id number]  HPI 79 year old male with past history of prostate cancer s/p retropubic prostatectomy, hypertension, gastritis and known sleep apnea.  He comes in today for a scheduled follow up.   Breathing is stable.  No active cardiac symptoms.  Not able to use CPAP.   Bowels stable.  Blood pressure averaging 120's-140s/70s.  Overall he feels he is doing relatively well.  No increased heart rate or palpitations.  Still with back pain.  Stable. He is not watching his diet.  Not exercising.   Discussed again with him today - needs to start.  Plans to restart.     Past Medical History  Diagnosis Date  . Arthritis   . Cancer     prostate,skin  . Chicken pox   . Diverticulitis   . Ulcer   . Hypertension   . Sleep apnea   . Hyperlipidemia   . Melanoma     Malignant resection  . Anemia   . GERD (gastroesophageal reflux disease)     Current Outpatient Prescriptions on File Prior to Visit  Medication Sig Dispense Refill  . acetaminophen (TYLENOL) 500 MG tablet Take 500 mg by mouth every 6 (six) hours as needed.    Marland Kitchen amLODipine (NORVASC) 5 MG tablet TAKE ONE (1) TABLET EACH DAY 30 tablet 6  . aspirin 81 MG tablet Take 81 mg by mouth daily.    Sarajane Marek Sodium 30-100 MG CAPS Take by mouth.    . fluticasone (FLONASE) 50 MCG/ACT nasal spray Place 2 sprays into both nostrils daily. 16 g 1  . hydrochlorothiazide (HYDRODIURIL) 25 MG tablet TAKE ONE (1) TABLET EACH DAY 30 tablet 6  . hyoscyamine (LEVSIN, ANASPAZ) 0.125 MG tablet Take 0.125 mg by mouth as needed.    . irbesartan (AVAPRO) 300 MG tablet TAKE ONE (1) TABLET EACH DAY 30 tablet 10  . L-Lysine 500 MG TABS Take by mouth daily.    Marland Kitchen levothyroxine (SYNTHROID, LEVOTHROID) 88 MCG tablet Take 1 tablet (88 mcg total) by mouth daily. 30 tablet 5  . omeprazole (PRILOSEC) 20 MG capsule TAKE ONE CAPSULE TWICE A DAY 60 capsule 6  . traMADol (ULTRAM) 50  MG tablet TAKE ONE TABLET TWICE DAILY AS NEEDED 60 tablet 1  . valACYclovir (VALTREX) 1000 MG tablet TAKE 1 TABLET BY MOUTH EVERY DAY. 30 tablet 1   No current facility-administered medications on file prior to visit.    Review of Systems Patient denies any headache, lightheadedness or dizziness.  No sinus or allergy symptoms.  No chest pain, tightness or palpitations.  No increased shortness of breath, cough or congestion.  Breathing stable.  Tries to stay active.  No nausea or vomiting.  No increased abdominal pain or cramping.   No bowel change, such as diarrhea, constipation, BRBPR or melana.  No urine change.   Not watching his diet.  Blood pressures as outlined.      Objective:   Physical Exam  Filed Vitals:   06/24/14 1348  BP: 130/80  Pulse: 61  Temp: 98.1 F (73.68 C)   79 year old male in no acute distress.  HEENT:  Nares - clear.  Oropharynx - without lesions. NECK:  Supple.  Nontender.  No audible carotid bruit.  HEART:  Appears to be regular.   LUNGS:  No crackles or wheezing audible.  Respirations even and unlabored.   RADIAL PULSE:  Equal  bilaterally.  ABDOMEN:  Soft.  Nontender.  Bowel sounds present and normal.  No audible abdominal bruit.   EXTREMITIES:  No increased edema present.  DP pulses palpable and equal bilaterally.          Assessment & Plan:  1. Essential hypertension Blood pressure under reasonable control.  Same medication regimen.  Follow.  - Basic metabolic panel; Future  2. Hypercholesterolemia Low cholesterol diet and exercise.   - Lipid panel; Future - Hepatic function panel; Future  3. Obesity (BMI 30-39.9) Discussed the need for diet and exercise.    4. History of colonic polyps Colonoscopy as outlined.    5. Malignant melanoma Recent liver panel wnl.  Follows with dermatology.    6. Sleep apnea Unable to tolerate the mask.  Declines another trial.  Understands the risk of sleep apnea.  Avoid lying supine and avoid sedating  medications.    7. Prostate cancer History of prostate cancer.  PSA 06/20/14 - .01.    8. Barrett's esophagus EGD 11/28/12.  Due f/u EGD 11/2015.    9. CARDIOVASCULAR.  Evaluated by Dr Nehemiah Massed 07/10/12.  See his note for details.  Stable.  No changes made.  Doing better and feeling better.  ECHO 12/21/11 - EF 45%, mild biatrial enlargement, moderate mitral and tricuspid insufficiency and mild pulmonary hypertension.    10. GI.  Colonoscopy 03/15/11 revealed multiple polyps and internal hemorrhoids.  Follow up colonoscopy and EGD 4/14.  Bowels stable.     HEALTH MAINTENANCE.  Physical 01/12/13.   Schedule a physical exam.  Colonoscopy as outlined.  PSA 06/20/14 - .01

## 2014-06-27 ENCOUNTER — Encounter: Payer: Self-pay | Admitting: Internal Medicine

## 2014-08-06 ENCOUNTER — Other Ambulatory Visit: Payer: Self-pay | Admitting: Internal Medicine

## 2014-08-06 NOTE — Telephone Encounter (Signed)
Last OV 11.23.15.  please advise refill

## 2014-08-06 NOTE — Telephone Encounter (Signed)
Rx faxed

## 2014-08-06 NOTE — Telephone Encounter (Signed)
rx sent if for valtrex #30 with one refill.  rx ok'd for tramadol #60 with one refill.

## 2014-10-03 ENCOUNTER — Other Ambulatory Visit: Payer: Self-pay | Admitting: Internal Medicine

## 2014-10-23 ENCOUNTER — Other Ambulatory Visit (INDEPENDENT_AMBULATORY_CARE_PROVIDER_SITE_OTHER): Payer: Medicare Other

## 2014-10-23 DIAGNOSIS — I10 Essential (primary) hypertension: Secondary | ICD-10-CM | POA: Diagnosis not present

## 2014-10-23 DIAGNOSIS — E7800 Pure hypercholesterolemia, unspecified: Secondary | ICD-10-CM

## 2014-10-23 LAB — BASIC METABOLIC PANEL
BUN: 15 mg/dL (ref 6–23)
CO2: 32 mEq/L (ref 19–32)
Calcium: 9.5 mg/dL (ref 8.4–10.5)
Chloride: 102 mEq/L (ref 96–112)
Creatinine, Ser: 1.23 mg/dL (ref 0.40–1.50)
GFR: 60.59 mL/min (ref >60.00)
Glucose, Bld: 98 mg/dL (ref 70–99)
Potassium: 3.5 mEq/L (ref 3.5–5.1)
SODIUM: 140 meq/L (ref 135–145)

## 2014-10-23 LAB — LIPID PANEL
Cholesterol: 187 mg/dL (ref 0–200)
HDL: 51.3 mg/dL (ref >39.00)
NonHDL: 135.7
TRIGLYCERIDES: 234 mg/dL — AB (ref 0.0–149.0)
Total CHOL/HDL Ratio: 4
VLDL: 46.8 mg/dL — AB (ref 0.0–40.0)

## 2014-10-23 LAB — HEPATIC FUNCTION PANEL
ALT: 41 U/L (ref 0–53)
AST: 31 U/L (ref 0–37)
Albumin: 4 g/dL (ref 3.5–5.2)
Alkaline Phosphatase: 54 U/L (ref 39–117)
Bilirubin, Direct: 0.1 mg/dL (ref 0.0–0.3)
Total Bilirubin: 0.7 mg/dL (ref 0.2–1.2)
Total Protein: 6.6 g/dL (ref 6.0–8.3)

## 2014-10-23 LAB — LDL CHOLESTEROL, DIRECT: Direct LDL: 100 mg/dL

## 2014-10-23 NOTE — Addendum Note (Signed)
Addended by: Johnsie Cancel on: 10/23/2014 08:12 AM   Modules accepted: Orders

## 2014-10-24 ENCOUNTER — Encounter: Payer: Self-pay | Admitting: Internal Medicine

## 2014-10-28 ENCOUNTER — Ambulatory Visit: Payer: Medicare Other | Admitting: Internal Medicine

## 2014-10-29 NOTE — Telephone Encounter (Signed)
Unread mychart message mailed to patient 

## 2014-11-01 ENCOUNTER — Encounter: Payer: Self-pay | Admitting: Internal Medicine

## 2014-11-01 ENCOUNTER — Ambulatory Visit (INDEPENDENT_AMBULATORY_CARE_PROVIDER_SITE_OTHER): Payer: Medicare Other | Admitting: Internal Medicine

## 2014-11-01 VITALS — BP 135/78 | HR 55 | Temp 98.2°F | Ht 68.75 in | Wt 245.2 lb

## 2014-11-01 DIAGNOSIS — K2270 Barrett's esophagus without dysplasia: Secondary | ICD-10-CM | POA: Diagnosis not present

## 2014-11-01 DIAGNOSIS — Z8546 Personal history of malignant neoplasm of prostate: Secondary | ICD-10-CM

## 2014-11-01 DIAGNOSIS — G4730 Sleep apnea, unspecified: Secondary | ICD-10-CM

## 2014-11-01 DIAGNOSIS — I10 Essential (primary) hypertension: Secondary | ICD-10-CM

## 2014-11-01 DIAGNOSIS — C439 Malignant melanoma of skin, unspecified: Secondary | ICD-10-CM

## 2014-11-01 DIAGNOSIS — Z860100 Personal history of colon polyps, unspecified: Secondary | ICD-10-CM

## 2014-11-01 DIAGNOSIS — C61 Malignant neoplasm of prostate: Secondary | ICD-10-CM

## 2014-11-01 DIAGNOSIS — E7800 Pure hypercholesterolemia, unspecified: Secondary | ICD-10-CM

## 2014-11-01 DIAGNOSIS — E669 Obesity, unspecified: Secondary | ICD-10-CM

## 2014-11-01 LAB — PSA, MEDICARE: PSA: 0 ng/ml — ABNORMAL LOW (ref 0.10–4.00)

## 2014-11-01 NOTE — Progress Notes (Signed)
Pre visit review using our clinic review tool, if applicable. No additional management support is needed unless otherwise documented below in the visit note. 

## 2014-11-02 ENCOUNTER — Encounter: Payer: Self-pay | Admitting: Internal Medicine

## 2014-11-03 ENCOUNTER — Encounter: Payer: Self-pay | Admitting: Internal Medicine

## 2014-11-03 NOTE — Assessment & Plan Note (Signed)
Diet and exercise.   

## 2014-11-03 NOTE — Assessment & Plan Note (Signed)
Colonoscopy 11/28/12 - tubular adenomas.  Recommend f/u colonoscopy 10/2017.

## 2014-11-03 NOTE — Progress Notes (Signed)
[name], male   DOB: 1934-08-10, 80 y.o.   MRN: [id number]   Subjective:    [name], male    DOB: 18-Nov-1934, 80 y.o.   MRN: [id number]  HPI  Patient here for a scheduled follow up.  States he is doing well.  Does report some fatigue.  Has been working hard.  Feels heart is stable.  Breathing stable.  Stays active.  Bowels stable.     Past Medical History  Diagnosis Date  . Arthritis   . Cancer     prostate,skin  . Chicken pox   . Diverticulitis   . Ulcer   . Hypertension   . Sleep apnea   . Hyperlipidemia   . Melanoma     Malignant resection  . Anemia   . GERD (gastroesophageal reflux disease)     Current Outpatient Prescriptions on File Prior to Visit  Medication Sig Dispense Refill  . acetaminophen (TYLENOL) 500 MG tablet Take 500 mg by mouth every 6 (six) hours as needed.    Marland Kitchen amLODipine (NORVASC) 5 MG tablet TAKE ONE (1) TABLET EACH DAY 30 tablet 6  . aspirin 81 MG tablet Take 81 mg by mouth daily.    Hector [name] Sodium 30-100 MG CAPS Take by mouth.    . fluticasone (FLONASE) 50 MCG/ACT nasal spray Place 2 sprays into both nostrils daily. 16 g 1  . hydrochlorothiazide (HYDRODIURIL) 25 MG tablet TAKE ONE (1) TABLET EACH DAY 30 tablet 6  . hyoscyamine (LEVSIN, ANASPAZ) 0.125 MG tablet Take 0.125 mg by mouth as needed.    . irbesartan (AVAPRO) 300 MG tablet TAKE ONE (1) TABLET EACH DAY 30 tablet 10  . L-Lysine 500 MG TABS Take by mouth daily.    Marland Kitchen levothyroxine (SYNTHROID, LEVOTHROID) 88 MCG tablet Take 1 tablet (88 mcg total) by mouth daily. 30 tablet 5  . omeprazole (PRILOSEC) 20 MG capsule TAKE ONE CAPSULE TWICE A DAY 60 capsule 6  . traMADol (ULTRAM) 50 MG tablet TAKE ONE TABLET TWICE DAILY AS NEEDED 60 tablet 1  . valACYclovir (VALTREX) 1000 MG tablet TAKE 1 TABLET BY MOUTH EVERY DAY. 30 tablet 1   No current facility-administered medications on file prior to visit.    Review of Systems  Constitutional: Positive for  fatigue. Negative for appetite change and unexpected weight change.  HENT: Negative for congestion and sinus pressure.   Respiratory: Negative for cough, chest tightness and shortness of breath.   Cardiovascular: Negative for chest pain, palpitations and leg swelling.  Gastrointestinal: Negative for nausea, vomiting, abdominal pain (no change in abdominal pain. ) and diarrhea.  Neurological: Negative for dizziness, light-headedness and headaches.       Objective:    Physical Exam  Constitutional: He appears well-developed and well-nourished. No distress.  HENT:  Nose: Nose normal.  Mouth/Throat: Oropharynx is clear and moist. No oropharyngeal exudate.  Neck: Neck supple. No thyromegaly present.  Cardiovascular: Normal rate and regular rhythm.   Pulmonary/Chest: Effort normal and breath sounds normal. No respiratory distress.  Abdominal: Soft. Bowel sounds are normal. There is no tenderness.  Musculoskeletal: He exhibits no edema.  Lymphadenopathy:    He has no cervical adenopathy.  Skin: No rash noted. No erythema.  Psychiatric: He has a normal mood and affect. His behavior is normal.    BP 135/78 mmHg  Pulse 55  Temp(Src) 98.2 F (36.8 C) (Oral)  Ht 5' 8.75" (1.746 m)  Wt 245 lb 4 oz (111.245 kg)  BMI 36.49 kg/m2  SpO2 95% Wt Readings from Last 3 Encounters:  11/01/14 245 lb 4 oz (111.245 kg)  06/24/14 243 lb 4 oz (110.337 kg)  02/18/14 238 lb 4 oz (108.069 kg)     Lab Results  Component Value Date   WBC 6.2 02/27/2014   HGB 15.1 02/27/2014   HCT 44.3 02/27/2014   PLT 196.0 02/27/2014   GLUCOSE 98 10/23/2014   CHOL 187 10/23/2014   TRIG 234.0* 10/23/2014   HDL 51.30 10/23/2014   LDLDIRECT 100.0 10/23/2014   LDLCALC 106* 06/20/2014   ALT 41 10/23/2014   AST 31 10/23/2014   NA 140 10/23/2014   K 3.5 10/23/2014   CL 102 10/23/2014   CREATININE 1.23 10/23/2014   BUN 15 10/23/2014   CO2 32 10/23/2014   TSH 2.57 06/20/2014   PSA 0.00* 11/01/2014   HGBA1C 5.3  09/06/2012       Assessment & Plan:   Problem List Items Addressed This Visit    Barrett's esophagus    EGD 11/28/12.  Due f/u EGD 11/2015.        History of colonic polyps    Colonoscopy 11/28/12 - tubular adenomas.  Recommend f/u colonoscopy 10/2017.        Hypercholesterolemia    LDL 100 on recent checks.  Increased triglycerides.  Low carb diet.  Follow.        Hypertension    Blood pressure under reasonable control.  Blood pressures recorded on outside checks - 140s/60-70s.  Same medication regimen.        Malignant melanoma    Sees dermatology.        Obesity (BMI 30-39.9)    Diet and exercise.        Prostate cancer    History of prostate cancer.  Check PSA today.        Sleep apnea    Unable to tolerate the mask.  Declines another trial.  Understands risk of sleep apnea.  Avoid lying supine and avoid sedating medications.         Other Visit Diagnoses    History of prostate cancer    -  Primary    Relevant Orders    PSA, Medicare (Completed)        Hector Pheasant, MD

## 2014-11-03 NOTE — Assessment & Plan Note (Signed)
Sees dermatology.

## 2014-11-03 NOTE — Assessment & Plan Note (Signed)
Unable to tolerate the mask.  Declines another trial.  Understands risk of sleep apnea.  Avoid lying supine and avoid sedating medications.

## 2014-11-03 NOTE — Assessment & Plan Note (Signed)
Blood pressure under reasonable control.  Blood pressures recorded on outside checks - 140s/60-70s.  Same medication regimen.

## 2014-11-03 NOTE — Assessment & Plan Note (Signed)
History of prostate cancer.  Check PSA today.

## 2014-11-03 NOTE — Assessment & Plan Note (Signed)
LDL 100 on recent checks.  Increased triglycerides.  Low carb diet.  Follow.

## 2014-11-03 NOTE — Assessment & Plan Note (Signed)
EGD 11/28/12.  Due f/u EGD 11/2015.

## 2014-11-04 ENCOUNTER — Other Ambulatory Visit: Payer: Self-pay | Admitting: Internal Medicine

## 2014-11-04 NOTE — Telephone Encounter (Signed)
Last refill 3.3.16, last OV 4.1.16.  Please advise refill.

## 2014-11-05 NOTE — Telephone Encounter (Signed)
Refilled valtrex #30 with 3 refills.

## 2014-11-22 NOTE — Op Note (Signed)
PATIENT NAME:  Hector [name], Hector [name] MR#:  [id number] DATE OF BIRTH:  Dec 06, 1934  DATE OF PROCEDURE:  11/27/2012  PROCEDURE: An upper endoscopy   PREOPERATIVE DIAGNOSIS: Epigastric discomfort.  POSTOPERATIVE DIAGNOSIS:  Minimal gastritis, await biopsies.   MEDICATIONS: Propofol.   PROCEDURE PERFORMED:  The scope was advanced on direct vision, passed down the esophagus into the stomach. Fundic pool was suctioned. Air was put in the stomach. Scope passed to the pyloric area into the duodenal bulb and second portion. These areas were normal. Scope was withdrawn to the stomach. Examination of antrum, body, cardia and fundus showed minimal erythema in the distal stomach. Biopsies taken x 2.  Retroflex view of the cardia and fundus showed no other abnormalities.  Air was removed, scope was withdrawn. The patient tolerated the procedure well.   PLAN: Await biopsy reports.    ADDENDUM:  This is [name] dictated note due to inability to print the original  note off of the computer.  ____________________________ Manya Silvas, MD rte:cc D: 11/29/2012 17:02:45 ET T: 11/29/2012 17:48:16 ET JOB#: [id number]  cc: Manya Silvas, MD, <Dictator> Manya Silvas MD ELECTRONICALLY SIGNED 12/13/2012 14:14

## 2014-12-02 DIAGNOSIS — H2512 Age-related nuclear cataract, left eye: Secondary | ICD-10-CM | POA: Diagnosis not present

## 2014-12-04 ENCOUNTER — Other Ambulatory Visit: Payer: Self-pay | Admitting: Internal Medicine

## 2015-02-04 ENCOUNTER — Other Ambulatory Visit: Payer: Self-pay | Admitting: Internal Medicine

## 2015-02-04 NOTE — Telephone Encounter (Signed)
Last visit 11/01/14, refill?

## 2015-02-04 NOTE — Telephone Encounter (Signed)
Faxed to pharmacy

## 2015-02-04 NOTE — Telephone Encounter (Signed)
Refilled tramadol #60 with one refill.   

## 2015-02-20 ENCOUNTER — Encounter: Payer: Self-pay | Admitting: Internal Medicine

## 2015-02-20 ENCOUNTER — Ambulatory Visit (INDEPENDENT_AMBULATORY_CARE_PROVIDER_SITE_OTHER): Payer: Medicare Other | Admitting: Internal Medicine

## 2015-02-20 VITALS — BP 120/78 | HR 57 | Temp 97.9°F | Ht 68.0 in | Wt 245.5 lb

## 2015-02-20 DIAGNOSIS — C439 Malignant melanoma of skin, unspecified: Secondary | ICD-10-CM

## 2015-02-20 DIAGNOSIS — C61 Malignant neoplasm of prostate: Secondary | ICD-10-CM

## 2015-02-20 DIAGNOSIS — E7800 Pure hypercholesterolemia, unspecified: Secondary | ICD-10-CM

## 2015-02-20 DIAGNOSIS — E669 Obesity, unspecified: Secondary | ICD-10-CM

## 2015-02-20 DIAGNOSIS — I10 Essential (primary) hypertension: Secondary | ICD-10-CM

## 2015-02-20 DIAGNOSIS — K2270 Barrett's esophagus without dysplasia: Secondary | ICD-10-CM

## 2015-02-20 DIAGNOSIS — G4730 Sleep apnea, unspecified: Secondary | ICD-10-CM

## 2015-02-20 DIAGNOSIS — Z86010 Personal history of colonic polyps: Secondary | ICD-10-CM

## 2015-02-20 DIAGNOSIS — Z0000 Encounter for general adult medical examination without abnormal findings: Secondary | ICD-10-CM | POA: Diagnosis not present

## 2015-02-20 NOTE — Progress Notes (Signed)
[name], male   DOB: [birth year], 80 y.o.   MRN: [id number]   Subjective:    [name], male    DOB: June 04, 1935, 80 y.o.   MRN: [id number]  HPI  Patient here to follow up on his current medical issues as well as for a physical.  He fell a couple of months ago.  Tripped.  No head injury.  Injured his left thumb.  Is better.  Still some discomfort at times, but overall better.  Desires no further intervention.  Reports no cardiac symptoms with increased activity or exertion.  No sob.  Eating and drinking well.  No nausea or vomiting.  Bowels stable.  Blood pressures averaging 130-140s/70s.  No abdominal pain.     Past Medical History  Diagnosis Date  . Arthritis   . Cancer     prostate,skin  . Chicken pox   . Diverticulitis   . Ulcer   . Hypertension   . Sleep apnea   . Hyperlipidemia   . Melanoma     Malignant resection  . Anemia   . GERD (gastroesophageal reflux disease)     Outpatient Encounter Prescriptions as of 02/20/2015  Medication Sig  . acetaminophen (TYLENOL) 500 MG tablet Take 500 mg by mouth every 6 (six) hours as needed.  Marland Kitchen amLODipine (NORVASC) 5 MG tablet TAKE ONE (1) TABLET EACH DAY  . aspirin 81 MG tablet Take 81 mg by mouth daily.  Sarajane Marek Sodium 30-100 MG CAPS Take by mouth.  . fluticasone (FLONASE) 50 MCG/ACT nasal spray Place 2 sprays into both nostrils daily.  . hydrochlorothiazide (HYDRODIURIL) 25 MG tablet TAKE ONE (1) TABLET EACH DAY  . hyoscyamine (LEVSIN, ANASPAZ) 0.125 MG tablet Take 0.125 mg by mouth as needed.  . irbesartan (AVAPRO) 300 MG tablet TAKE ONE (1) TABLET EACH DAY  . L-Lysine 500 MG TABS Take by mouth daily.  Marland Kitchen levothyroxine (SYNTHROID, LEVOTHROID) 88 MCG tablet TAKE ONE (1) TABLET EACH DAY  . omeprazole (PRILOSEC) 20 MG capsule TAKE ONE CAPSULE TWICE A DAY  . traMADol (ULTRAM) 50 MG tablet TAKE ONE TABLET TWICE DAILY AS NEEDED  . valACYclovir (VALTREX) 1000 MG tablet TAKE 1 TABLET BY MOUTH  EVERY DAY.   No facility-administered encounter medications on file as of 02/20/2015.    Review of Systems  Constitutional: Negative for appetite change and unexpected weight change.  HENT: Negative for congestion and sinus pressure.   Eyes: Negative for pain and visual disturbance.  Respiratory: Negative for cough, chest tightness and shortness of breath.   Cardiovascular: Negative for chest pain, palpitations and leg swelling.  Gastrointestinal: Negative for nausea, vomiting, abdominal pain and diarrhea.  Genitourinary: Negative for dysuria and difficulty urinating.  Musculoskeletal: Positive for back pain (chronic.  stable.  ). Negative for joint swelling.  Skin: Negative for color change and rash.  Neurological: Negative for dizziness, light-headedness and headaches.  Hematological: Negative for adenopathy. Does not bruise/bleed easily.  Psychiatric/Behavioral: Negative for dysphoric mood and agitation.       Objective:    Physical Exam  Constitutional: He is oriented to person, place, and time. He appears well-developed and well-nourished. No distress.  HENT:  Head: Normocephalic and atraumatic.  Nose: Nose normal.  Mouth/Throat: Oropharynx is clear and moist. No oropharyngeal exudate.  Eyes: Conjunctivae are normal. Right eye exhibits no discharge. Left eye exhibits no discharge.  Neck: Neck supple. No thyromegaly present.  Cardiovascular: Normal rate and regular rhythm.   Pulmonary/Chest: Breath sounds  normal. No respiratory distress. He has no wheezes.  Abdominal: Soft. Bowel sounds are normal. There is no tenderness.  Genitourinary:  Is s/p prostatectomy.  No rectal exam performed.   Musculoskeletal: He exhibits no edema or tenderness.  Lymphadenopathy:    He has no cervical adenopathy.  Neurological: He is alert and oriented to person, place, and time.  Skin: Skin is warm and dry. No rash noted.  Psychiatric: He has a normal mood and affect. His behavior is normal.      BP 120/78 mmHg  Pulse 57  Temp(Src) 97.9 F (36.6 C) (Oral)  Ht 5\' 8"  (1.727 m)  Wt 245 lb 8 oz (111.358 kg)  BMI 37.34 kg/m2  SpO2 97% Wt Readings from Last 3 Encounters:  02/20/15 245 lb 8 oz (111.358 kg)  11/01/14 245 lb 4 oz (111.245 kg)  06/24/14 243 lb 4 oz (110.337 kg)     Lab Results  Component Value Date   WBC 6.2 02/27/2014   HGB 15.1 02/27/2014   HCT 44.3 02/27/2014   PLT 196.0 02/27/2014   GLUCOSE 98 10/23/2014   CHOL 187 10/23/2014   TRIG 234.0* 10/23/2014   HDL 51.30 10/23/2014   LDLDIRECT 100.0 10/23/2014   LDLCALC 106* 06/20/2014   ALT 41 10/23/2014   AST 31 10/23/2014   NA 140 10/23/2014   K 3.5 10/23/2014   CL 102 10/23/2014   CREATININE 1.23 10/23/2014   BUN 15 10/23/2014   CO2 32 10/23/2014   TSH 2.57 06/20/2014   PSA 0.00* 11/01/2014   HGBA1C 5.3 09/06/2012       Assessment & Plan:   Problem List Items Addressed This Visit    Barrett's esophagus    EGD 11/28/12.  Due f/u EGD 11/2015.       Health care maintenance    Physical today 02/20/15.  PSA 0.0 - 11/01/14.  Colonoscopy 11/28/12.  Recommended f/u colonoscopy in 10/2017.        History of colonic polyps    Colonoscopy 11/28/12 - tubular adenomas.  Repeat colonoscopy 10/2017.        Hypercholesterolemia    Low cholesterol diet and exercise.  Check lipid panel with next fasting labs.       Relevant Orders   Lipid panel   Hepatic function panel   Hypertension - Primary    Blood pressure has been under reasonable control.  Continue same medication regimen.  Follow pressures.  Follow metabolic panel.        Relevant Orders   CBC with Differential/Platelet   Basic metabolic panel   Malignant melanoma    Followed by dermatology.       Obesity (BMI 30-39.9)    Diet and exercise.        Prostate cancer    History of prostate cancer.  S/p prostatectomy.  PSA 0.0 - 11/01/14.        Sleep apnea    Unable to tolerate the mask.  Has declines another trial.  Avoid lying supine and  avoid sedating medications.            Hector Pheasant, MD

## 2015-02-20 NOTE — Progress Notes (Signed)
Pre visit review using our clinic review tool, if applicable. No additional management support is needed unless otherwise documented below in the visit note. 

## 2015-02-22 ENCOUNTER — Encounter: Payer: Self-pay | Admitting: Internal Medicine

## 2015-02-22 DIAGNOSIS — Z79899 Other long term (current) drug therapy: Secondary | ICD-10-CM | POA: Insufficient documentation

## 2015-02-22 NOTE — Assessment & Plan Note (Signed)
Low cholesterol diet and exercise.  Check lipid panel with next fasting labs.    

## 2015-02-22 NOTE — Assessment & Plan Note (Signed)
History of prostate cancer.  S/p prostatectomy.  PSA 0.0 - 11/01/14.

## 2015-02-22 NOTE — Assessment & Plan Note (Signed)
Blood pressure has been under reasonable control.  Continue same medication regimen.  Follow pressures.  Follow metabolic panel.  

## 2015-02-22 NOTE — Assessment & Plan Note (Signed)
EGD 11/28/12.  Due f/u EGD 11/2015.

## 2015-02-22 NOTE — Assessment & Plan Note (Signed)
Unable to tolerate the mask.  Has declines another trial.  Avoid lying supine and avoid sedating medications.

## 2015-02-22 NOTE — Assessment & Plan Note (Signed)
Diet and exercise.   

## 2015-02-22 NOTE — Assessment & Plan Note (Addendum)
Physical today 02/20/15.  PSA 0.0 - 11/01/14.  Colonoscopy 11/28/12.  Recommended f/u colonoscopy in 10/2017.

## 2015-02-22 NOTE — Assessment & Plan Note (Signed)
Colonoscopy 11/28/12 - tubular adenomas.  Repeat colonoscopy 10/2017.

## 2015-02-22 NOTE — Assessment & Plan Note (Signed)
Followed by dermatology

## 2015-03-03 ENCOUNTER — Other Ambulatory Visit: Payer: Self-pay | Admitting: Internal Medicine

## 2015-03-31 ENCOUNTER — Other Ambulatory Visit: Payer: Self-pay | Admitting: Internal Medicine

## 2015-04-21 ENCOUNTER — Other Ambulatory Visit (INDEPENDENT_AMBULATORY_CARE_PROVIDER_SITE_OTHER): Payer: Medicare Other

## 2015-04-21 ENCOUNTER — Other Ambulatory Visit: Payer: Self-pay | Admitting: Internal Medicine

## 2015-04-21 DIAGNOSIS — I10 Essential (primary) hypertension: Secondary | ICD-10-CM | POA: Diagnosis not present

## 2015-04-21 DIAGNOSIS — E7800 Pure hypercholesterolemia, unspecified: Secondary | ICD-10-CM

## 2015-04-21 DIAGNOSIS — R945 Abnormal results of liver function studies: Secondary | ICD-10-CM

## 2015-04-21 DIAGNOSIS — R7989 Other specified abnormal findings of blood chemistry: Secondary | ICD-10-CM

## 2015-04-21 LAB — CBC WITH DIFFERENTIAL/PLATELET
Basophils Absolute: 0 10*3/uL (ref 0.0–0.1)
Basophils Relative: 0.4 % (ref 0.0–3.0)
Eosinophils Absolute: 0.1 10*3/uL (ref 0.0–0.7)
Eosinophils Relative: 2 % (ref 0.0–5.0)
HCT: 45.5 % (ref 39.0–52.0)
Hemoglobin: 15.7 g/dL (ref 13.0–17.0)
Lymphocytes Relative: 29.5 % (ref 12.0–46.0)
Lymphs Abs: 2.1 10*3/uL (ref 0.7–4.0)
MCHC: 34.5 g/dL (ref 30.0–36.0)
MCV: 91.7 fl (ref 78.0–100.0)
Monocytes Absolute: 0.7 10*3/uL (ref 0.1–1.0)
Monocytes Relative: 10.3 % (ref 3.0–12.0)
Neutro Abs: 4 10*3/uL (ref 1.4–7.7)
Neutrophils Relative %: 57.8 % (ref 43.0–77.0)
Platelets: 244 10*3/uL (ref 150.0–400.0)
RBC: 4.96 Mil/uL (ref 4.22–5.81)
RDW: 13.9 % (ref 11.5–15.5)
WBC: 7 10*3/uL (ref 4.0–10.5)

## 2015-04-21 LAB — HEPATIC FUNCTION PANEL
ALT: 46 U/L (ref 0–53)
AST: 42 U/L — ABNORMAL HIGH (ref 0–37)
Albumin: 4.2 g/dL (ref 3.5–5.2)
Alkaline Phosphatase: 47 U/L (ref 39–117)
Bilirubin, Direct: 0.2 mg/dL (ref 0.0–0.3)
Total Bilirubin: 0.9 mg/dL (ref 0.2–1.2)
Total Protein: 7.5 g/dL (ref 6.0–8.3)

## 2015-04-21 LAB — BASIC METABOLIC PANEL
BUN: 18 mg/dL (ref 6–23)
CALCIUM: 9.6 mg/dL (ref 8.4–10.5)
CO2: 31 mEq/L (ref 19–32)
Chloride: 102 mEq/L (ref 96–112)
Creatinine, Ser: 1.31 mg/dL (ref 0.40–1.50)
GFR: 56.26 mL/min — AB (ref >60.00)
Glucose, Bld: 100 mg/dL — ABNORMAL HIGH (ref 70–99)
POTASSIUM: 3.9 meq/L (ref 3.5–5.1)
SODIUM: 141 meq/L (ref 135–145)

## 2015-04-21 LAB — LIPID PANEL
CHOLESTEROL: 187 mg/dL (ref 0–200)
HDL: 49.4 mg/dL (ref >39.00)
LDL Cholesterol: 105 mg/dL — ABNORMAL HIGH (ref 0–99)
NONHDL: 137.65
Total CHOL/HDL Ratio: 4
Triglycerides: 163 mg/dL — ABNORMAL HIGH (ref 0.0–149.0)
VLDL: 32.6 mg/dL (ref 0.0–40.0)

## 2015-04-21 NOTE — Progress Notes (Signed)
Order placed for lab 

## 2015-04-22 ENCOUNTER — Encounter: Payer: Self-pay | Admitting: *Deleted

## 2015-04-23 ENCOUNTER — Other Ambulatory Visit: Payer: Medicare Other

## 2015-05-06 ENCOUNTER — Other Ambulatory Visit (INDEPENDENT_AMBULATORY_CARE_PROVIDER_SITE_OTHER): Payer: Medicare Other

## 2015-05-06 DIAGNOSIS — R945 Abnormal results of liver function studies: Secondary | ICD-10-CM

## 2015-05-06 DIAGNOSIS — R748 Abnormal levels of other serum enzymes: Secondary | ICD-10-CM

## 2015-05-06 DIAGNOSIS — R7989 Other specified abnormal findings of blood chemistry: Secondary | ICD-10-CM | POA: Diagnosis not present

## 2015-05-06 LAB — BASIC METABOLIC PANEL
BUN: 16 mg/dL (ref 6–23)
CALCIUM: 9.4 mg/dL (ref 8.4–10.5)
CHLORIDE: 101 meq/L (ref 96–112)
CO2: 31 meq/L (ref 19–32)
CREATININE: 1.27 mg/dL (ref 0.40–1.50)
GFR: 58.31 mL/min — ABNORMAL LOW (ref >60.00)
Glucose, Bld: 118 mg/dL — ABNORMAL HIGH (ref 70–99)
Potassium: 3.8 mEq/L (ref 3.5–5.1)
Sodium: 139 mEq/L (ref 135–145)

## 2015-05-06 LAB — HEPATIC FUNCTION PANEL
ALK PHOS: 56 U/L (ref 39–117)
ALT: 47 U/L (ref 0–53)
AST: 43 U/L — AB (ref 0–37)
Albumin: 4 g/dL (ref 3.5–5.2)
BILIRUBIN DIRECT: 0.1 mg/dL (ref 0.0–0.3)
TOTAL PROTEIN: 7.1 g/dL (ref 6.0–8.3)
Total Bilirubin: 0.5 mg/dL (ref 0.2–1.2)

## 2015-05-07 ENCOUNTER — Other Ambulatory Visit: Payer: Self-pay | Admitting: Internal Medicine

## 2015-05-07 DIAGNOSIS — R945 Abnormal results of liver function studies: Secondary | ICD-10-CM

## 2015-05-07 DIAGNOSIS — R7989 Other specified abnormal findings of blood chemistry: Secondary | ICD-10-CM

## 2015-05-07 NOTE — Progress Notes (Signed)
Order placed for f/u liver panel.  

## 2015-05-13 ENCOUNTER — Ambulatory Visit (INDEPENDENT_AMBULATORY_CARE_PROVIDER_SITE_OTHER): Payer: Medicare Other

## 2015-05-13 VITALS — BP 128/72 | HR 60 | Temp 97.4°F | Resp 14 | Ht 68.0 in | Wt 247.0 lb

## 2015-05-13 DIAGNOSIS — Z0000 Encounter for general adult medical examination without abnormal findings: Secondary | ICD-10-CM | POA: Diagnosis not present

## 2015-05-13 NOTE — Progress Notes (Signed)
Subjective:   Hector [name] is a 80 y.o. male who presents for an Initial Medicare Annual Wellness Visit.  Review of Systems  No ROS.  Medicare Wellness Visit.  Cardiac Risk Factors include: advanced age (>43men, >64 women);male gender;hypertension;obesity (BMI >30kg/m2)    Objective:    Today's Vitals   05/13/15 0825  BP: 128/72  Pulse: 60  Temp: 97.4 F (36.3 C)  TempSrc: Oral  Resp: 14  Height: 5\' 8"  (1.727 m)  Weight: 247 lb (112.038 kg)  SpO2: 97%  PainSc: 3     Current Medications (verified) Outpatient Encounter Prescriptions as of 05/13/2015  Medication Sig  . acetaminophen (TYLENOL) 500 MG tablet Take 500 mg by mouth every 6 (six) hours as needed.  Marland Kitchen amLODipine (NORVASC) 5 MG tablet TAKE ONE (1) TABLET EACH DAY  . aspirin 81 MG tablet Take 81 mg by mouth daily.  Sarajane Marek Sodium 30-100 MG CAPS Take by mouth.  . fluticasone (FLONASE) 50 MCG/ACT nasal spray Place 2 sprays into both nostrils daily.  . hydrochlorothiazide (HYDRODIURIL) 25 MG tablet TAKE ONE (1) TABLET EACH DAY  . hyoscyamine (LEVSIN, ANASPAZ) 0.125 MG tablet Take 0.125 mg by mouth as needed.  . irbesartan (AVAPRO) 300 MG tablet TAKE ONE (1) TABLET EACH DAY  . L-Lysine 500 MG TABS Take by mouth daily.  Marland Kitchen levothyroxine (SYNTHROID, LEVOTHROID) 88 MCG tablet TAKE ONE (1) TABLET EACH DAY  . omeprazole (PRILOSEC) 20 MG capsule TAKE ONE CAPSULE TWICE A DAY  . traMADol (ULTRAM) 50 MG tablet TAKE ONE TABLET TWICE DAILY AS NEEDED  . valACYclovir (VALTREX) 1000 MG tablet TAKE 1 TABLET BY MOUTH EVERY DAY.  . [DISCONTINUED] amLODipine (NORVASC) 5 MG tablet TAKE ONE (1) TABLET EACH DAY  . [DISCONTINUED] hydrochlorothiazide (HYDRODIURIL) 25 MG tablet TAKE ONE (1) TABLET EACH DAY  . [DISCONTINUED] omeprazole (PRILOSEC) 20 MG capsule TAKE ONE CAPSULE TWICE A DAY   No facility-administered encounter medications on file as of 05/13/2015.    Allergies (verified) Tape   History: Past Medical  History  Diagnosis Date  . Arthritis   . Cancer ([place])     prostate,skin  . Chicken pox   . Diverticulitis   . Ulcer   . Hypertension   . Sleep apnea   . Hyperlipidemia   . Melanoma ([place])     Malignant resection  . Anemia   . GERD (gastroesophageal reflux disease)    Past Surgical History  Procedure Laterality Date  . Prostate surgery    . Cystocopy  2003  . Hemorrhoid surgery    . Cardiac catheterization    . Sleep study    . Colon surgery  2006-2008-2011    polyps removed  . Cataract surgery Right 02/13/14   Family History  Problem Relation Age of Onset  . Heart disease Father   . Kidney disease Father    Social History   Occupational History  . Not on file.   Social History Main Topics  . Smoking status: Never Smoker   . Smokeless tobacco: Never Used  . Alcohol Use: No  . Drug Use: No  . Sexual Activity: No   Tobacco Counseling Counseling given: Not Answered   Activities of Daily Living In your present state of health, do you have any difficulty performing the following activities: 05/13/2015  Hearing? N  Vision? N  Difficulty concentrating or making decisions? N  Walking or climbing stairs? N  Dressing or bathing? N  Doing errands, shopping? N  Conservation officer, nature and  eating ? N  Using the Toilet? N  In the past six months, have you accidently leaked urine? N  Do you have problems with loss of bowel control? N  Managing your Medications? N  Managing your Finances? N  Housekeeping or managing your Housekeeping? N    Immunizations and Health Maintenance Immunization History  Administered Date(s) Administered  . Influenza Split 05/03/2012, 05/01/2014  . Influenza,inj,Quad PF,36+ Mos 05/13/2015  . Pneumococcal Conjugate-13 04/17/2014  . Td 04/17/2014  . Zoster 05/02/2010   Health Maintenance Due  Topic Date Due  . PNA vac Low Risk Adult (2 of 2 - PPSV23) 04/18/2015    Patient Care Team: Einar Pheasant, MD as PCP - General (Internal  Medicine) Einar Pheasant, MD (Internal Medicine)  Indicate any recent Medical Services you may have received from other than [facility] providers in the past year (date may be approximate).    Assessment:   This is a routine wellness examination for [name]. The goal of the wellness visit is to assist the patient how to close the gaps in care and create a preventative care plan for the patient.   Calcium and Vit D as appropriate/ Osteoporosis risk reviewed.  Taking meds without issues; no barriers identified.  Safety issues reviewed; smoke detectors in the home. Firearms locked in a secure area. Wears seatbelts when driving or riding with others. No violence in the home.  No identified risk were noted; The patient was oriented x 3; appropriate in dress and manner and no objective failures at ADL's or IADL's.   Influenza vaccine administered  -Atrial Fibrillation-stable and followed by Dr. Nicki Reaper (PCP)  Hearing/Vision screen  Hearing Screening   Method: Audiometry   125Hz  250Hz  500Hz  1000Hz  2000Hz  4000Hz  8000Hz   Right ear:   Pass Pass Pass Fail   Left ear:   Pass Pass Pass Fail   Vision Screening Comments: Followed [facility], Dr. Mordecai Rasmussen. Annual visits. Last OV May 2016. Wears glasses.  Dietary issues and exercise activities discussed: Current Exercise Habits:: The patient does not participate in regular exercise at present (Does yard work.)  Goals    . Increase physical activity     Chair exercises    . Increase water intake     Increase water intake up to 2 cups per day      Depression Screen PHQ 2/9 Scores 05/13/2015 02/20/2015 04/17/2014 04/17/2014  PHQ - 2 Score 0 0 0 0    Fall Risk Fall Risk  05/13/2015 02/20/2015 04/17/2014 04/17/2014 02/18/2014  Falls in the past year? Yes Yes No No No  Number falls in past yr: 1 1 - - -  Injury with Fall? Yes Yes - - -  Follow up Education provided;Falls prevention discussed - - - -    Cognitive Function: MMSE - Mini  Mental State Exam 05/13/2015  Orientation to time 5  Orientation to Place 5  Registration 3  Attention/ Calculation 5  Recall 3  Language- name 2 objects 2  Language- repeat 1  Language- follow 3 step command 3  Language- read & follow direction 1  Write a sentence 1  Copy design 1  Total score 30    Screening Tests Health Maintenance  Topic Date Due  . PNA vac Low Risk Adult (2 of 2 - PPSV23) 04/18/2015  . INFLUENZA VACCINE  03/02/2016  . TETANUS/TDAP  04/17/2024  . ZOSTAVAX  Completed        Plan:    End of life planning was discussed;  aging in home or other; plans to return copy of HCPOA/Living Will.   During the course of the visit Jameison was educated and counseled about the following appropriate screening and preventive services:   Vaccines to include Pneumoccal, Influenza, Hepatitis B, Td, Zostavax, HCV  Electrocardiogram  Colorectal cancer screening  Cardiovascular disease screening  Diabetes screening  Glaucoma screening  Nutrition counseling  Prostate cancer screening  Smoking cessation counseling  Patient Instructions (the written plan) were given to the patient.   Varney Biles, LPN   [id number]     Reviewed information above.  Agree with plan.   Dr Nicki Reaper

## 2015-05-13 NOTE — Patient Instructions (Addendum)
Hector [name],  Thank you for taking time to come for your Medicare Wellness Visit.  I appreciate your ongoing commitment to your health goals. Please review the following plan we discussed and let me know if I can assist you in the future.  Flu shot administered today  Fat and Cholesterol Restricted Diet High levels of fat and cholesterol in your blood may lead to various health problems, such as diseases of the heart, blood vessels, gallbladder, liver, and pancreas. Fats are concentrated sources of energy that come in various forms. Certain types of fat, including saturated fat, may be harmful in excess. Cholesterol is a substance needed by your body in small amounts. Your body makes all the cholesterol it needs. Excess cholesterol comes from the food you eat. When you have high levels of cholesterol and saturated fat in your blood, health problems can develop because the excess fat and cholesterol will gather along the walls of your blood vessels, causing them to narrow. Choosing the right foods will help you control your intake of fat and cholesterol. This will help keep the levels of these substances in your blood within normal limits and reduce your risk of disease. WHAT IS MY PLAN? Your health care provider recommends that you:  Get no more than __________ % of the total calories in your daily diet from fat.  Limit your intake of saturated fat to less than ______% of your total calories each day.  Limit the amount of cholesterol in your diet to less than _________mg per day. WHAT TYPES OF FAT SHOULD I CHOOSE?  Choose healthy fats more often. Choose monounsaturated and polyunsaturated fats, such as olive and canola oil, flaxseeds, walnuts, almonds, and seeds.  Eat more omega-3 fats. Good choices include salmon, mackerel, sardines, tuna, flaxseed oil, and ground flaxseeds. Aim to eat fish at least two times a week.  Limit saturated fats. Saturated fats are primarily found in animal products,  such as meats, butter, and cream. Plant sources of saturated fats include palm oil, palm kernel oil, and coconut oil.  Avoid foods with partially hydrogenated oils in them. These contain trans fats. Examples of foods that contain trans fats are stick margarine, some tub margarines, cookies, crackers, and other baked goods. WHAT GENERAL GUIDELINES DO I NEED TO FOLLOW? These guidelines for healthy eating will help you control your intake of fat and cholesterol:  Check food labels carefully to identify foods with trans fats or high amounts of saturated fat.  Fill one half of your plate with vegetables and green salads.  Fill one fourth of your plate with whole grains. Look for the word "whole" as the first word in the ingredient list.  Fill one fourth of your plate with lean protein foods.  Limit fruit to two servings a day. Choose fruit instead of juice.  Eat more foods that contain soluble fiber. Examples of foods that contain this type of fiber are apples, broccoli, carrots, beans, peas, and barley. Aim to get 20-30 g of fiber per day.  Eat more home-cooked food and less restaurant, buffet, and fast food.  Limit or avoid alcohol.  Limit foods high in starch and sugar.  Limit fried foods.  Cook foods using methods other than frying. Baking, boiling, grilling, and broiling are all great options.  Lose weight if you are overweight. Losing just 5-10% of your initial body weight can help your overall health and prevent diseases such as diabetes and heart disease. WHAT FOODS CAN I EAT? Grains Whole grains,  such as whole wheat or whole grain breads, crackers, cereals, and pasta. Unsweetened oatmeal, bulgur, barley, quinoa, or brown rice. Corn or whole wheat flour tortillas. Vegetables Fresh or frozen vegetables (raw, steamed, roasted, or grilled). Green salads. Fruits All fresh, canned (in natural juice), or frozen fruits. Meat and Other Protein Products Ground beef (85% or leaner),  grass-fed beef, or beef trimmed of fat. Skinless chicken or [place]. Ground chicken or [place]. Pork trimmed of fat. All fish and seafood. Eggs. Dried beans, peas, or lentils. Unsalted nuts or seeds. Unsalted canned or dry beans. Dairy Low-fat dairy products, such as skim or 1% milk, 2% or reduced-fat cheeses, low-fat ricotta or cottage cheese, or plain low-fat yogurt. Fats and Oils Tub margarines without trans fats. Light or reduced-fat mayonnaise and salad dressings. Avocado. Olive, canola, sesame, or safflower oils. Natural peanut or almond butter (choose ones without added sugar and oil). The items listed above may not be a complete list of recommended foods or beverages. Contact your dietitian for more options. WHAT FOODS ARE NOT RECOMMENDED? Grains White bread. White pasta. White rice. Cornbread. Bagels, pastries, and croissants. Crackers that contain trans fat. Vegetables White potatoes. Corn. Creamed or fried vegetables. Vegetables in a cheese sauce. Fruits Dried fruits. Canned fruit in light or heavy syrup. Fruit juice. Meat and Other Protein Products Fatty cuts of meat. Ribs, chicken wings, bacon, sausage, bologna, salami, chitterlings, fatback, hot dogs, bratwurst, and packaged luncheon meats. Liver and organ meats. Dairy Whole or 2% milk, cream, half-and-half, and cream cheese. Whole milk cheeses. Whole-fat or sweetened yogurt. Full-fat cheeses. Nondairy creamers and whipped toppings. Processed cheese, cheese spreads, or cheese curds. Sweets and Desserts Corn syrup, sugars, honey, and molasses. Candy. Jam and jelly. Syrup. Sweetened cereals. Cookies, pies, cakes, donuts, muffins, and ice cream. Fats and Oils Butter, stick margarine, lard, shortening, ghee, or bacon fat. Coconut, palm kernel, or palm oils. Beverages Alcohol. Sweetened drinks (such as sodas, lemonade, and fruit drinks or punches). The items listed above may not be a complete list of foods and beverages to avoid.  Contact your dietitian for more information.   This information is not intended to replace advice given to you by your health care provider. Make sure you discuss any questions you have with your health care provider.   Document Released: 07/19/2005 Document Revised: 08/09/2014 Document Reviewed: 10/17/2013 Elsevier Interactive Patient Education 2016 [place] in the Home  Falls can cause injuries. They can happen to people of all ages. There are many things you can do to make your home safe and to help prevent falls.  WHAT CAN I DO ON THE OUTSIDE OF MY HOME?  Regularly fix the edges of walkways and driveways and fix any cracks.  Remove anything that might make you trip as you walk through a door, such as a raised step or threshold.  Trim any bushes or trees on the path to your home.  Use bright outdoor lighting.  Clear any walking paths of anything that might make someone trip, such as rocks or tools.  Regularly check to see if handrails are loose or broken. Make sure that both sides of any steps have handrails.  Any raised decks and porches should have guardrails on the edges.  Have any leaves, snow, or ice cleared regularly.  Use sand or salt on walking paths during winter.  Clean up any spills in your garage right away. This includes oil or grease spills. WHAT CAN I DO IN THE BATHROOM?  Use night lights.  Install grab bars by the toilet and in the tub and shower. Do not use towel bars as grab bars.  Use non-skid mats or decals in the tub or shower.  If you need to sit down in the shower, use a plastic, non-slip stool.  Keep the floor dry. Clean up any water that spills on the floor as soon as it happens.  Remove soap buildup in the tub or shower regularly.  Attach bath mats securely with double-sided non-slip rug tape.  Do not have throw rugs and other things on the floor that can make you trip. WHAT CAN I DO IN THE BEDROOM?  Use night  lights.  Make sure that you have a light by your bed that is easy to reach.  Do not use any sheets or blankets that are too big for your bed. They should not hang down onto the floor.  Have a firm chair that has side arms. You can use this for support while you get dressed.  Do not have throw rugs and other things on the floor that can make you trip. WHAT CAN I DO IN THE KITCHEN?  Clean up any spills right away.  Avoid walking on wet floors.  Keep items that you use a lot in easy-to-reach places.  If you need to reach something above you, use a strong step stool that has a grab bar.  Keep electrical cords out of the way.  Do not use floor polish or wax that makes floors slippery. If you must use wax, use non-skid floor wax.  Do not have throw rugs and other things on the floor that can make you trip. WHAT CAN I DO WITH MY STAIRS?  Do not leave any items on the stairs.  Make sure that there are handrails on both sides of the stairs and use them. Fix handrails that are broken or loose. Make sure that handrails are as long as the stairways.  Check any carpeting to make sure that it is firmly attached to the stairs. Fix any carpet that is loose or worn.  Avoid having throw rugs at the top or bottom of the stairs. If you do have throw rugs, attach them to the floor with carpet tape.  Make sure that you have a light switch at the top of the stairs and the bottom of the stairs. If you do not have them, ask someone to add them for you. WHAT ELSE CAN I DO TO HELP PREVENT FALLS?  Wear shoes that:  Do not have high heels.  Have rubber bottoms.  Are comfortable and fit you well.  Are closed at the toe. Do not wear sandals.  If you use a stepladder:  Make sure that it is fully opened. Do not climb a closed stepladder.  Make sure that both sides of the stepladder are locked into place.  Ask someone to hold it for you, if possible.  Clearly mark and make sure that you can  see:  Any grab bars or handrails.  First and last steps.  Where the edge of each step is.  Use tools that help you move around (mobility aids) if they are needed. These include:  Canes.  Walkers.  Scooters.  Crutches.  Turn on the lights when you go into a dark area. Replace any light bulbs as soon as they burn out.  Set up your furniture so you have a clear path. Avoid moving your furniture around.  If any  of your floors are uneven, fix them.  If there are any pets around you, be aware of where they are.  Review your medicines with your doctor. Some medicines can make you feel dizzy. This can increase your chance of falling. Ask your doctor what other things that you can do to help prevent falls.   This information is not intended to replace advice given to you by your health care provider. Make sure you discuss any questions you have with your health care provider.   Document Released: 05/15/2009 Document Revised: 12/03/2014 Document Reviewed: 08/23/2014 Elsevier Interactive Patient Education [employer].

## 2015-05-26 DIAGNOSIS — L821 Other seborrheic keratosis: Secondary | ICD-10-CM | POA: Diagnosis not present

## 2015-05-26 DIAGNOSIS — C44519 Basal cell carcinoma of skin of other part of trunk: Secondary | ICD-10-CM | POA: Diagnosis not present

## 2015-05-26 DIAGNOSIS — D1801 Hemangioma of skin and subcutaneous tissue: Secondary | ICD-10-CM | POA: Diagnosis not present

## 2015-05-26 DIAGNOSIS — Z85820 Personal history of malignant melanoma of skin: Secondary | ICD-10-CM | POA: Diagnosis not present

## 2015-05-26 DIAGNOSIS — L814 Other melanin hyperpigmentation: Secondary | ICD-10-CM | POA: Diagnosis not present

## 2015-05-26 DIAGNOSIS — L570 Actinic keratosis: Secondary | ICD-10-CM | POA: Diagnosis not present

## 2015-06-04 ENCOUNTER — Ambulatory Visit: Payer: Self-pay | Admitting: Internal Medicine

## 2015-06-04 ENCOUNTER — Encounter: Payer: Self-pay | Admitting: Internal Medicine

## 2015-06-04 ENCOUNTER — Other Ambulatory Visit: Payer: Self-pay | Admitting: Internal Medicine

## 2015-06-04 ENCOUNTER — Ambulatory Visit
Admission: RE | Admit: 2015-06-04 | Discharge: 2015-06-04 | Disposition: A | Discharge status: Home / Self Care | Payer: Medicare Other | Source: Ambulatory Visit | Attending: Internal Medicine | Admitting: Internal Medicine

## 2015-06-04 ENCOUNTER — Ambulatory Visit (INDEPENDENT_AMBULATORY_CARE_PROVIDER_SITE_OTHER): Payer: Medicare Other | Admitting: Internal Medicine

## 2015-06-04 VITALS — BP 124/74 | HR 62 | Temp 97.9°F | Resp 18 | Ht 68.0 in | Wt 246.0 lb

## 2015-06-04 DIAGNOSIS — J069 Acute upper respiratory infection, unspecified: Secondary | ICD-10-CM | POA: Diagnosis not present

## 2015-06-04 DIAGNOSIS — I10 Essential (primary) hypertension: Secondary | ICD-10-CM | POA: Diagnosis not present

## 2015-06-04 DIAGNOSIS — R0602 Shortness of breath: Secondary | ICD-10-CM | POA: Insufficient documentation

## 2015-06-04 DIAGNOSIS — R05 Cough: Secondary | ICD-10-CM

## 2015-06-04 DIAGNOSIS — R062 Wheezing: Secondary | ICD-10-CM

## 2015-06-04 DIAGNOSIS — I2510 Atherosclerotic heart disease of native coronary artery without angina pectoris: Secondary | ICD-10-CM | POA: Diagnosis not present

## 2015-06-04 DIAGNOSIS — R059 Cough, unspecified: Secondary | ICD-10-CM

## 2015-06-04 MED ORDER — PREDNISONE 10 MG PO TABS: ORAL_TABLET | ORAL | Status: DC

## 2015-06-04 MED ORDER — CEFDINIR 300 MG PO CAPS: 300.0000 mg | ORAL_CAPSULE | Freq: Two times a day (BID) | ORAL | Status: DC

## 2015-06-04 NOTE — Progress Notes (Signed)
Pre-visit discussion using our clinic review tool. No additional management support is needed unless otherwise documented below in the visit note.  

## 2015-06-04 NOTE — Patient Instructions (Signed)
Saline nasal spray - flush nose at least 2-3x/day  nasacort nasal spray - 2 sprays each nostril one time per day.  Do this in the evening.  

## 2015-06-04 NOTE — Progress Notes (Signed)
[name], male   DOB: Dec 03, 1934, 80 y.o.   MRN: [id number]   Subjective:    [name], male    DOB: 23-Feb-1935, 80 y.o.   MRN: [id number]  HPI  Patient with past history of prostate cancer, hypertension, GERD and sleep apnea.  He comes in today as a work in with concerns regarding persistent cough and congestion. He reports symptoms started approximately 10-11 days ago.  Increased nasal congestion.  Increased drainage.  No sore throat.  Increased productive cough.  Colored mucus.  Some increased wheezing.  Increased cough with some vomiting associated with the increased cough.  Minimal diarrhea.  No increased abdominal pain or cramping.  Fatigue from increased cough.     Past Medical History  Diagnosis Date  . Arthritis   . Cancer ([place])     prostate,skin  . Chicken pox   . Diverticulitis   . Ulcer   . Hypertension   . Sleep apnea   . Hyperlipidemia   . Melanoma ([place])     Malignant resection  . Anemia   . GERD (gastroesophageal reflux disease)    Past Surgical History  Procedure Laterality Date  . Prostate surgery    . Cystocopy  2003  . Hemorrhoid surgery    . Cardiac catheterization    . Sleep study    . Colon surgery  2006-2008-2011    polyps removed  . Cataract surgery Right 02/13/14   Family History  Problem Relation Age of Onset  . Heart disease Father   . Kidney disease Father    Social History   Social History  . Marital Status: Married    Spouse Name: N/A  . Number of Children: N/A  . Years of Education: N/A   Social History Main Topics  . Smoking status: Never Smoker   . Smokeless tobacco: Never Used  . Alcohol Use: No  . Drug Use: No  . Sexual Activity: No   Other Topics Concern  . None   Social History Narrative    Outpatient Encounter Prescriptions as of 06/04/2015  Medication Sig  . acetaminophen (TYLENOL) 500 MG tablet Take 500 mg by mouth every 6 (six) hours as needed.  Marland Kitchen amLODipine (NORVASC) 5 MG tablet TAKE  ONE (1) TABLET EACH DAY  . aspirin 81 MG tablet Take 81 mg by mouth daily.  Sarajane Marek Sodium 30-100 MG CAPS Take by mouth.  . hydrochlorothiazide (HYDRODIURIL) 25 MG tablet TAKE ONE (1) TABLET EACH DAY  . hyoscyamine (LEVSIN, ANASPAZ) 0.125 MG tablet Take 0.125 mg by mouth as needed.  . irbesartan (AVAPRO) 300 MG tablet TAKE ONE (1) TABLET EACH DAY  . L-Lysine 500 MG TABS Take by mouth daily.  Marland Kitchen levothyroxine (SYNTHROID, LEVOTHROID) 88 MCG tablet TAKE ONE (1) TABLET EACH DAY  . omeprazole (PRILOSEC) 20 MG capsule TAKE ONE CAPSULE TWICE A DAY  . traMADol (ULTRAM) 50 MG tablet TAKE ONE TABLET TWICE DAILY AS NEEDED  . valACYclovir (VALTREX) 1000 MG tablet TAKE 1 TABLET BY MOUTH EVERY DAY.  . [DISCONTINUED] fluticasone (FLONASE) 50 MCG/ACT nasal spray Place 2 sprays into both nostrils daily.  . cefdinir (OMNICEF) 300 MG capsule Take 1 capsule (300 mg total) by mouth 2 (two) times daily.  . predniSONE (DELTASONE) 10 MG tablet Take 6 tablets x 1 day and then decrease by 1/2 tablet per day until down to zero mg   No facility-administered encounter medications on file as of 06/04/2015.    Review of  Systems  Constitutional: Positive for fatigue.       Is eating.  Some decreased appetite.   HENT: Positive for congestion, postnasal drip and sinus pressure. Negative for sore throat.   Eyes: Negative for pain and discharge.  Respiratory: Positive for cough. Negative for chest tightness.   Cardiovascular: Negative for chest pain, palpitations and leg swelling.  Gastrointestinal: Positive for vomiting (only with increased cough. ). Negative for abdominal pain and diarrhea.  Musculoskeletal: Negative for joint swelling.  Skin: Negative for color change and rash.  Neurological: Negative for dizziness, light-headedness and headaches.       Objective:    Physical Exam  Constitutional: He appears well-developed and well-nourished. No distress.  HENT:  Mouth/Throat: Oropharynx is clear  and moist.  No significant tenderness to palpation over the sinuses.  Nares - slightly erythematous turbinates.    Eyes: Conjunctivae are normal. Right eye exhibits no discharge. Left eye exhibits no discharge.  Neck: Neck supple.  Cardiovascular: Normal rate and regular rhythm.   Pulmonary/Chest: No respiratory distress.  Increased cough with forced expiration.    Musculoskeletal: He exhibits no edema.  Lymphadenopathy:    He has no cervical adenopathy.  Psychiatric: He has a normal mood and affect. His behavior is normal.    BP 124/74 mmHg  Pulse 62  Temp(Src) 97.9 F (36.6 C) (Oral)  Resp 18  Ht 5\' 8"  (1.727 m)  Wt 246 lb (111.585 kg)  BMI 37.41 kg/m2  SpO2 95% Wt Readings from Last 3 Encounters:  06/04/15 246 lb (111.585 kg)  05/13/15 247 lb (112.038 kg)  02/20/15 245 lb 8 oz (111.358 kg)     Lab Results  Component Value Date   WBC 7.0 04/21/2015   HGB 15.7 04/21/2015   HCT 45.5 04/21/2015   PLT 244.0 04/21/2015   GLUCOSE 118* 05/06/2015   CHOL 187 04/21/2015   TRIG 163.0* 04/21/2015   HDL 49.40 04/21/2015   LDLDIRECT 100.0 10/23/2014   LDLCALC 105* 04/21/2015   ALT 47 05/06/2015   AST 43* 05/06/2015   NA 139 05/06/2015   K 3.8 05/06/2015   CL 101 05/06/2015   CREATININE 1.27 05/06/2015   BUN 16 05/06/2015   CO2 31 05/06/2015   TSH 2.57 06/20/2014   PSA 0.00* 11/01/2014   HGBA1C 5.3 09/06/2012       Assessment & Plan:   Problem List Items Addressed This Visit    Hypertension    Blood pressure has been doing well.  Same medication regimen.  Follow pressures.        URI (upper respiratory infection)    Increased congestion and cough as outlined.  Reports wheezing, etc.  Treat with omnicef as directed.  Prednisone taper as directed.  flonase nasal spray and saline nasal spray as directed.  mucinex and robitussin as directed.  Rest.  Fluids.  Explained to him if symptoms changed, worsened or did not resolve - needs to be reevaluated.  Check cxr.        Relevant Medications   cefdinir (OMNICEF) 300 MG capsule    Other Visit Diagnoses    Cough    -  Primary    Relevant Orders    DG Chest 2 View (Completed)    Wheezing        Relevant Orders    DG Chest 2 View (Completed)        Einar Pheasant, MD

## 2015-06-09 ENCOUNTER — Encounter: Payer: Self-pay | Admitting: Internal Medicine

## 2015-06-09 DIAGNOSIS — J069 Acute upper respiratory infection, unspecified: Secondary | ICD-10-CM | POA: Insufficient documentation

## 2015-06-09 NOTE — Assessment & Plan Note (Addendum)
Increased congestion and cough as outlined.  Reports wheezing, etc.  Treat with omnicef as directed.  Prednisone taper as directed.  flonase nasal spray and saline nasal spray as directed.  mucinex and robitussin as directed.  Rest.  Fluids.  Explained to him if symptoms changed, worsened or did not resolve - needs to be reevaluated.  Check cxr.

## 2015-06-09 NOTE — Assessment & Plan Note (Signed)
Blood pressure has been doing well.  Same medication regimen.  Follow pressures.   

## 2015-06-23 ENCOUNTER — Encounter: Payer: Self-pay | Admitting: Internal Medicine

## 2015-06-23 ENCOUNTER — Ambulatory Visit (INDEPENDENT_AMBULATORY_CARE_PROVIDER_SITE_OTHER): Payer: Medicare Other | Admitting: Internal Medicine

## 2015-06-23 VITALS — BP 120/80 | HR 70 | Temp 98.3°F | Resp 18 | Ht 68.0 in | Wt 241.0 lb

## 2015-06-23 DIAGNOSIS — G4730 Sleep apnea, unspecified: Secondary | ICD-10-CM

## 2015-06-23 DIAGNOSIS — R7989 Other specified abnormal findings of blood chemistry: Secondary | ICD-10-CM

## 2015-06-23 DIAGNOSIS — J069 Acute upper respiratory infection, unspecified: Secondary | ICD-10-CM

## 2015-06-23 DIAGNOSIS — Z86010 Personal history of colonic polyps: Secondary | ICD-10-CM

## 2015-06-23 DIAGNOSIS — E7800 Pure hypercholesterolemia, unspecified: Secondary | ICD-10-CM

## 2015-06-23 DIAGNOSIS — C61 Malignant neoplasm of prostate: Secondary | ICD-10-CM | POA: Diagnosis not present

## 2015-06-23 DIAGNOSIS — C439 Malignant melanoma of skin, unspecified: Secondary | ICD-10-CM

## 2015-06-23 DIAGNOSIS — R945 Abnormal results of liver function studies: Secondary | ICD-10-CM

## 2015-06-23 DIAGNOSIS — E669 Obesity, unspecified: Secondary | ICD-10-CM

## 2015-06-23 DIAGNOSIS — I10 Essential (primary) hypertension: Secondary | ICD-10-CM | POA: Diagnosis not present

## 2015-06-23 LAB — HEPATIC FUNCTION PANEL
ALT: 48 U/L (ref 0–53)
AST: 29 U/L (ref 0–37)
Albumin: 4.1 g/dL (ref 3.5–5.2)
Alkaline Phosphatase: 62 U/L (ref 39–117)
BILIRUBIN DIRECT: 0.1 mg/dL (ref 0.0–0.3)
BILIRUBIN TOTAL: 0.6 mg/dL (ref 0.2–1.2)
Total Protein: 6.8 g/dL (ref 6.0–8.3)

## 2015-06-23 LAB — PSA, MEDICARE: PSA: 0.01 ng/mL — AB (ref 0.10–4.00)

## 2015-06-23 LAB — TSH: TSH: 2.08 u[IU]/mL (ref 0.35–4.50)

## 2015-06-23 NOTE — Assessment & Plan Note (Signed)
Followed by dermatology

## 2015-06-23 NOTE — Assessment & Plan Note (Signed)
Treated with abx and prednisone.  Much better.  Breathing back to his normal.  Follow.

## 2015-06-23 NOTE — Assessment & Plan Note (Signed)
Has lost weight.  Diet and exercise.

## 2015-06-23 NOTE — Assessment & Plan Note (Signed)
Low cholesterol diet and exercise.  Follow lipid panel.   Lab Results  Component Value Date   CHOL 187 04/21/2015   HDL 49.40 04/21/2015   LDLCALC 105* 04/21/2015   LDLDIRECT 100.0 10/23/2014   TRIG 163.0* 04/21/2015   CHOLHDL 4 04/21/2015

## 2015-06-23 NOTE — Assessment & Plan Note (Signed)
Unable to tolerate the mask.  Avoid lying supine and avoid sedating medications.  Follow.  Declines another trial.

## 2015-06-23 NOTE — Progress Notes (Signed)
[name], male   DOB: January 30, 1935, 80 y.o.   MRN: [id number]   Subjective:    [name], male    DOB: [birth year], 80 y.o.   MRN: [id number]  HPI  Patient with past history of prostate cancer, hypertension, GERD and hypercholesterolemia.  He comes in today to follow up on these issues.  Was recently seen on an acute visit for cough and congestion.  Treated with abx and prednisone.  Breathing better.  No cough or congestion.  No sob.  Feels from a cardiac standpoint, things are stable.  Has an occasional episode of bradycardia.   Some fatigue when occurs.  States is rare.  Has been worked up previously.  Feels overall stable and desires no further intervention at this time.  No nausea or vomiting.  No change - abdominal pain.  Bowels stable.  Blood pressure averaging 130-140s/60-80.  Has lost some weight.  He feels this is related to eating at home more.     Past Medical History  Diagnosis Date  . Arthritis   . Cancer ([place])     prostate,skin  . Chicken pox   . Diverticulitis   . Ulcer   . Hypertension   . Sleep apnea   . Hyperlipidemia   . Melanoma ([place])     Malignant resection  . Anemia   . GERD (gastroesophageal reflux disease)    Past Surgical History  Procedure Laterality Date  . Prostate surgery    . Cystocopy  2003  . Hemorrhoid surgery    . Cardiac catheterization    . Sleep study    . Colon surgery  2006-2008-2011    polyps removed  . Cataract surgery Right 02/13/14   Family History  Problem Relation Age of Onset  . Heart disease Father   . Kidney disease Father    Social History   Social History  . Marital Status: Married    Spouse Name: N/A  . Number of Children: N/A  . Years of Education: N/A   Social History Main Topics  . Smoking status: Never Smoker   . Smokeless tobacco: Never Used  . Alcohol Use: No  . Drug Use: No  . Sexual Activity: No   Other Topics Concern  . None   Social History Narrative    Outpatient  Encounter Prescriptions as of 06/23/2015  Medication Sig  . acetaminophen (TYLENOL) 500 MG tablet Take 500 mg by mouth every 6 (six) hours as needed.  Marland Kitchen amLODipine (NORVASC) 5 MG tablet TAKE ONE (1) TABLET EACH DAY  . aspirin 81 MG tablet Take 81 mg by mouth daily.  Sarajane Marek Sodium 30-100 MG CAPS Take by mouth.  . fluticasone (FLONASE) 50 MCG/ACT nasal spray TWO PUFFS IN EACH NOSTRIL ONCE A DAY  . hydrochlorothiazide (HYDRODIURIL) 25 MG tablet TAKE ONE (1) TABLET EACH DAY  . hyoscyamine (LEVSIN, ANASPAZ) 0.125 MG tablet Take 0.125 mg by mouth as needed.  . irbesartan (AVAPRO) 300 MG tablet TAKE ONE (1) TABLET EACH DAY  . L-Lysine 500 MG TABS Take by mouth daily.  Marland Kitchen levothyroxine (SYNTHROID, LEVOTHROID) 88 MCG tablet TAKE ONE (1) TABLET EACH DAY  . omeprazole (PRILOSEC) 20 MG capsule TAKE ONE CAPSULE TWICE A DAY  . traMADol (ULTRAM) 50 MG tablet TAKE ONE TABLET TWICE DAILY AS NEEDED  . valACYclovir (VALTREX) 1000 MG tablet TAKE 1 TABLET BY MOUTH EVERY DAY.  . [DISCONTINUED] cefdinir (OMNICEF) 300 MG capsule Take 1 capsule (300 mg total) by  mouth 2 (two) times daily.  . [DISCONTINUED] predniSONE (DELTASONE) 10 MG tablet Take 6 tablets x 1 day and then decrease by 1/2 tablet per day until down to zero mg   No facility-administered encounter medications on file as of 06/23/2015.    Review of Systems  Constitutional: Negative for fever and appetite change.  HENT: Negative for congestion and sinus pressure.   Eyes: Negative for pain and discharge.  Respiratory: Negative for cough, chest tightness and shortness of breath.   Cardiovascular: Negative for chest pain, palpitations and leg swelling.  Gastrointestinal: Negative for nausea, vomiting, abdominal pain and diarrhea.  Genitourinary: Negative for dysuria and difficulty urinating.  Musculoskeletal: Negative for joint swelling.       Stable back pain.  Not reported as an issue today.   Skin: Negative for color change and  rash.  Neurological: Negative for dizziness, light-headedness and headaches.  Psychiatric/Behavioral: Negative for dysphoric mood and agitation.       Objective:     Blood pressure rechecked by me:  138/78  Physical Exam  Constitutional: He appears well-developed and well-nourished. No distress.  HENT:  Nose: Nose normal.  Mouth/Throat: Oropharynx is clear and moist.  Eyes: Conjunctivae are normal. Right eye exhibits no discharge. Left eye exhibits no discharge.  Neck: Neck supple. No thyromegaly present.  Cardiovascular: Normal rate and regular rhythm.   Pulmonary/Chest: Effort normal and breath sounds normal. No respiratory distress.  Abdominal: Soft. Bowel sounds are normal. There is no tenderness.  Musculoskeletal: He exhibits no edema or tenderness.  Lymphadenopathy:    He has no cervical adenopathy.  Skin: No rash noted. No erythema.  Psychiatric: He has a normal mood and affect. His behavior is normal.    BP 120/80 mmHg  Pulse 70  Temp(Src) 98.3 F (36.8 C) (Oral)  Resp 18  Ht 5\' 8"  (1.727 m)  Wt 241 lb (109.317 kg)  BMI 36.65 kg/m2  SpO2 95% Wt Readings from Last 3 Encounters:  06/23/15 241 lb (109.317 kg)  06/04/15 246 lb (111.585 kg)  05/13/15 247 lb (112.038 kg)     Lab Results  Component Value Date   WBC 7.0 04/21/2015   HGB 15.7 04/21/2015   HCT 45.5 04/21/2015   PLT 244.0 04/21/2015   GLUCOSE 118* 05/06/2015   CHOL 187 04/21/2015   TRIG 163.0* 04/21/2015   HDL 49.40 04/21/2015   LDLDIRECT 100.0 10/23/2014   LDLCALC 105* 04/21/2015   ALT 47 05/06/2015   AST 43* 05/06/2015   NA 139 05/06/2015   K 3.8 05/06/2015   CL 101 05/06/2015   CREATININE 1.27 05/06/2015   BUN 16 05/06/2015   CO2 31 05/06/2015   TSH 2.57 06/20/2014   PSA 0.00* 11/01/2014   HGBA1C 5.3 09/06/2012    Dg Chest 2 View  06/04/2015  CLINICAL DATA:  Cough, congestion and wheezing. EXAM: CHEST  2 VIEW COMPARISON:  Report from 11/13/1997 chest radiograph (images not  available). FINDINGS: Top-normal heart size. Atherosclerotic aortic arch. Otherwise normal mediastinal contour. No pneumothorax. No pleural effusion. Clear lungs, with no focal lung consolidation and no pulmonary edema. IMPRESSION: No active cardiopulmonary disease. Electronically Signed   By: Ilona Sorrel M.D.   On: 06/04/2015 13:09       Assessment & Plan:   Problem List Items Addressed This Visit    History of colonic polyps    Colonoscopy 11/28/12 - tubular adenomas.  Recommended f/u colonoscopy in 10/2017.        Hypercholesterolemia    Low  cholesterol diet and exercise.  Follow lipid panel.   Lab Results  Component Value Date   CHOL 187 04/21/2015   HDL 49.40 04/21/2015   LDLCALC 105* 04/21/2015   LDLDIRECT 100.0 10/23/2014   TRIG 163.0* 04/21/2015   CHOLHDL 4 04/21/2015        Relevant Orders   Hepatic function panel   Hypertension - Primary    Blood pressure under reasonable control.  Continue same medication regimen.  Follow pressures.  Follow metabolic panel.        Relevant Orders   CBC with Differential/Platelet   Malignant melanoma ([place])    Followed by dermatology.        Obesity (BMI 30-39.9)    Has lost weight.  Diet and exercise.        Prostate cancer [facility])    History of prostate cancer s/p prostatectomy.  PSA 0.0 4/16.  Recheck psa today.       Relevant Orders   PSA, Medicare   Sleep apnea    Unable to tolerate the mask.  Avoid lying supine and avoid sedating medications.  Follow.  Declines another trial.        Relevant Orders   TSH   URI (upper respiratory infection)    Treated with abx and prednisone.  Much better.  Breathing back to his normal.  Follow.            Einar Pheasant, MD

## 2015-06-23 NOTE — Progress Notes (Signed)
Pre-visit discussion using our clinic review tool. No additional management support is needed unless otherwise documented below in the visit note.  

## 2015-06-23 NOTE — Assessment & Plan Note (Addendum)
Blood pressure under reasonable control.  Continue same medication regimen.  Follow pressures.  Follow metabolic panel.   

## 2015-06-23 NOTE — Assessment & Plan Note (Signed)
History of prostate cancer s/p prostatectomy.  PSA 0.0 4/16.  Recheck psa today.

## 2015-06-23 NOTE — Addendum Note (Signed)
Addended by: Peggyann Shoals on: 06/23/2015 08:38 AM   Modules accepted: Orders

## 2015-06-23 NOTE — Assessment & Plan Note (Signed)
Colonoscopy 11/28/12 - tubular adenomas.  Recommended f/u colonoscopy in 10/2017.

## 2015-06-24 LAB — CBC WITH DIFFERENTIAL/PLATELET
BASOS PCT: 0.1 % (ref 0.0–3.0)
Basophils Absolute: 0 10*3/uL (ref 0.0–0.1)
EOS ABS: 0.2 10*3/uL (ref 0.0–0.7)
Eosinophils Relative: 3.2 % (ref 0.0–5.0)
HCT: 47.3 % (ref 39.0–52.0)
Hemoglobin: 15.8 g/dL (ref 13.0–17.0)
Lymphocytes Relative: 9.5 % — ABNORMAL LOW (ref 12.0–46.0)
Lymphs Abs: 0.6 10*3/uL — ABNORMAL LOW (ref 0.7–4.0)
MCHC: 33.4 g/dL (ref 30.0–36.0)
MCV: 93.5 fl (ref 78.0–100.0)
MONO ABS: 1.2 10*3/uL — AB (ref 0.1–1.0)
Monocytes Relative: 18.2 % — ABNORMAL HIGH (ref 3.0–12.0)
NEUTROS ABS: 4.6 10*3/uL (ref 1.4–7.7)
NEUTROS PCT: 69 % (ref 43.0–77.0)
PLATELETS: 160 10*3/uL (ref 150.0–400.0)
RBC: 5.06 Mil/uL (ref 4.22–5.81)
RDW: 13.5 % (ref 11.5–15.5)
WBC: 6.7 10*3/uL (ref 4.0–10.5)

## 2015-06-25 ENCOUNTER — Other Ambulatory Visit: Payer: Self-pay | Admitting: Internal Medicine

## 2015-06-25 ENCOUNTER — Encounter: Payer: Self-pay | Admitting: *Deleted

## 2015-06-25 DIAGNOSIS — D72810 Lymphocytopenia: Secondary | ICD-10-CM

## 2015-06-25 NOTE — Progress Notes (Signed)
Order placed for f/u cbc.   

## 2015-06-30 NOTE — Telephone Encounter (Signed)
I am ok to change the lab appt.  See my chart message.

## 2015-07-02 ENCOUNTER — Other Ambulatory Visit: Payer: Self-pay | Admitting: Internal Medicine

## 2015-07-09 ENCOUNTER — Other Ambulatory Visit: Payer: Medicare Other

## 2015-07-15 ENCOUNTER — Other Ambulatory Visit (INDEPENDENT_AMBULATORY_CARE_PROVIDER_SITE_OTHER): Payer: Medicare Other

## 2015-07-15 DIAGNOSIS — D72810 Lymphocytopenia: Secondary | ICD-10-CM

## 2015-07-15 LAB — CBC WITH DIFFERENTIAL/PLATELET
BASOS ABS: 0 10*3/uL (ref 0.0–0.1)
BASOS PCT: 0.6 % (ref 0.0–3.0)
EOS ABS: 0.1 10*3/uL (ref 0.0–0.7)
Eosinophils Relative: 1.2 % (ref 0.0–5.0)
HCT: 45.7 % (ref 39.0–52.0)
Hemoglobin: 15.2 g/dL (ref 13.0–17.0)
LYMPHS ABS: 2.5 10*3/uL (ref 0.7–4.0)
LYMPHS PCT: 30.4 % (ref 12.0–46.0)
MCHC: 33.3 g/dL (ref 30.0–36.0)
MCV: 93.2 fl (ref 78.0–100.0)
MONO ABS: 1 10*3/uL (ref 0.1–1.0)
Monocytes Relative: 11.7 % (ref 3.0–12.0)
NEUTROS ABS: 4.6 10*3/uL (ref 1.4–7.7)
NEUTROS PCT: 56.1 % (ref 43.0–77.0)
PLATELETS: 237 10*3/uL (ref 150.0–400.0)
RBC: 4.91 Mil/uL (ref 4.22–5.81)
RDW: 13.3 % (ref 11.5–15.5)
WBC: 8.1 10*3/uL (ref 4.0–10.5)

## 2015-07-16 ENCOUNTER — Encounter: Payer: Self-pay | Admitting: Internal Medicine

## 2015-09-17 ENCOUNTER — Telehealth: Payer: Self-pay | Admitting: Internal Medicine

## 2015-09-17 NOTE — Telephone Encounter (Signed)
FYI,Pt called to schedule his AWV he received a call. Pt is already scheduled on 05/13/2016 @ 9:30am. Thank you!

## 2015-09-23 ENCOUNTER — Encounter: Payer: Self-pay | Admitting: Internal Medicine

## 2015-09-23 ENCOUNTER — Ambulatory Visit (INDEPENDENT_AMBULATORY_CARE_PROVIDER_SITE_OTHER): Payer: Medicare Other | Admitting: Internal Medicine

## 2015-09-23 VITALS — BP 140/80 | HR 63 | Temp 97.5°F | Resp 18 | Ht 68.0 in | Wt 245.4 lb

## 2015-09-23 DIAGNOSIS — C61 Malignant neoplasm of prostate: Secondary | ICD-10-CM

## 2015-09-23 DIAGNOSIS — E669 Obesity, unspecified: Secondary | ICD-10-CM | POA: Diagnosis not present

## 2015-09-23 DIAGNOSIS — K2270 Barrett's esophagus without dysplasia: Secondary | ICD-10-CM

## 2015-09-23 DIAGNOSIS — I10 Essential (primary) hypertension: Secondary | ICD-10-CM | POA: Diagnosis not present

## 2015-09-23 DIAGNOSIS — E7800 Pure hypercholesterolemia, unspecified: Secondary | ICD-10-CM

## 2015-09-23 DIAGNOSIS — C439 Malignant melanoma of skin, unspecified: Secondary | ICD-10-CM

## 2015-09-23 NOTE — Progress Notes (Signed)
Patient ID: SQUARE ENSINGER, male   DOB: May 15, 1935, 81 y.o.   MRN: [id number]   Subjective:    Patient ID: CURVIN HAGGAN, male    DOB: Jan 08, 1935, 81 y.o.   MRN: [id number]  HPI  Patient with past history of prostate cancer, hypertension and hypercholesterolemia.  He comes in today to follow up on these issues.  States he is doing well.  Handling stress well.  Tries to stay active.  No cardiac symptoms with increased activity or exertion.  No sob.  No acid reflux.  No increased abdominal pain.  Bowels stable.  Reviewed outside blood pressure readings.  Blood pressure averaging 130-140s/60-70s.     Past Medical History  Diagnosis Date  . Arthritis   . Cancer ([place])     prostate,skin  . Chicken pox   . Diverticulitis   . Ulcer   . Hypertension   . Sleep apnea   . Hyperlipidemia   . Melanoma ([place])     Malignant resection  . Anemia   . GERD (gastroesophageal reflux disease)    Past Surgical History  Procedure Laterality Date  . Prostate surgery    . Cystocopy  2003  . Hemorrhoid surgery    . Cardiac catheterization    . Sleep study    . Colon surgery  2006-2008-2011    polyps removed  . Cataract surgery Right 02/13/14   Family History  Problem Relation Age of Onset  . Heart disease Father   . Kidney disease Father    Social History   Social History  . Marital Status: Married    Spouse Name: N/A  . Number of Children: N/A  . Years of Education: N/A   Social History Main Topics  . Smoking status: Never Smoker   . Smokeless tobacco: Never Used  . Alcohol Use: No  . Drug Use: No  . Sexual Activity: No   Other Topics Concern  . None   Social History Narrative     Review of Systems  Constitutional: Negative for appetite change and unexpected weight change.  HENT: Negative for congestion and sinus pressure.   Respiratory: Negative for cough, chest tightness and shortness of breath.   Cardiovascular: Negative for chest pain, palpitations and leg swelling.    Gastrointestinal: Negative for vomiting, abdominal pain and diarrhea.  Genitourinary: Negative for dysuria and difficulty urinating.  Musculoskeletal: Negative for myalgias and joint swelling.  Skin: Negative for color change and rash.  Neurological: Negative for dizziness, light-headedness and headaches.  Psychiatric/Behavioral: Negative for dysphoric mood and agitation.       Objective:    Physical Exam  Constitutional: He appears well-developed and well-nourished. No distress.  HENT:  Nose: Nose normal.  Mouth/Throat: Oropharynx is clear and moist.  Eyes: Conjunctivae are normal. Right eye exhibits no discharge. Left eye exhibits no discharge.  Neck: Neck supple. No thyromegaly present.  Cardiovascular: Normal rate and regular rhythm.   Pulmonary/Chest: Effort normal and breath sounds normal. No respiratory distress.  Abdominal: Soft. Bowel sounds are normal. There is no tenderness.  Musculoskeletal: He exhibits no edema or tenderness.  Lymphadenopathy:    He has no cervical adenopathy.  Skin: No rash noted. No erythema.  Psychiatric: He has a normal mood and affect. His behavior is normal.    BP 140/80 mmHg  Pulse 63  Temp(Src) 97.5 F (36.4 C) (Oral)  Resp 18  Ht 5\' 8"  (1.727 m)  Wt 245 lb 6 oz (111.301 kg)  BMI 37.32 kg/m2  SpO2  96% Wt Readings from Last 3 Encounters:  09/23/15 245 lb 6 oz (111.301 kg)  06/23/15 241 lb (109.317 kg)  06/04/15 246 lb (111.585 kg)     Lab Results  Component Value Date   WBC 8.1 07/15/2015   HGB 15.2 07/15/2015   HCT 45.7 07/15/2015   PLT 237.0 07/15/2015   GLUCOSE 118* 05/06/2015   CHOL 187 04/21/2015   TRIG 163.0* 04/21/2015   HDL 49.40 04/21/2015   LDLDIRECT 100.0 10/23/2014   LDLCALC 105* 04/21/2015   ALT 48 06/23/2015   AST 29 06/23/2015   NA 139 05/06/2015   K 3.8 05/06/2015   CL 101 05/06/2015   CREATININE 1.27 05/06/2015   BUN 16 05/06/2015   CO2 31 05/06/2015   TSH 2.08 06/23/2015   PSA 0.01* 06/23/2015    HGBA1C 5.3 09/06/2012    Dg Chest 2 View  06/04/2015  CLINICAL DATA:  Cough, congestion and wheezing. EXAM: CHEST  2 VIEW COMPARISON:  Report from 11/13/1997 chest radiograph (images not available). FINDINGS: Top-normal heart size. Atherosclerotic aortic arch. Otherwise normal mediastinal contour. No pneumothorax. No pleural effusion. Clear lungs, with no focal lung consolidation and no pulmonary edema. IMPRESSION: No active cardiopulmonary disease. Electronically Signed   By: Ilona Sorrel M.D.   On: 06/04/2015 13:09       Assessment & Plan:   Problem List Items Addressed This Visit    Barrett's esophagus    EGD 11/28/12.  Due f/u EGD 11/2015.        Hypercholesterolemia    Low cholesterol diet and exercise.  Follow lipid panel.       Relevant Orders   Lipid panel   Hepatic function panel   Hypertension - Primary    Blood pressure under reasonable control.  Continue same medication regimen.  Follow pressures.  Follow metabolic panel.        Relevant Orders   Basic metabolic panel   Malignant melanoma ([place])    Followed by dermatology.        Obesity (BMI 30-39.9)    Diet and exercise.  Follow.       Prostate cancer [facility])    History of prostate cancer s/p prostatectomy.  Follow psa.       Relevant Orders   PSA, Medicare       Einar Pheasant, MD

## 2015-09-23 NOTE — Progress Notes (Signed)
Pre-visit discussion using our clinic review tool. No additional management support is needed unless otherwise documented below in the visit note.  

## 2015-10-01 ENCOUNTER — Other Ambulatory Visit: Payer: Self-pay | Admitting: Internal Medicine

## 2015-10-06 ENCOUNTER — Encounter: Payer: Self-pay | Admitting: Internal Medicine

## 2015-10-06 NOTE — Assessment & Plan Note (Signed)
History of prostate cancer.  s/p prostatectomy.  Follow psa.   

## 2015-10-06 NOTE — Assessment & Plan Note (Signed)
Blood pressure under reasonable control.  Continue same medication regimen.  Follow pressures.  Follow metabolic panel.   

## 2015-10-06 NOTE — Assessment & Plan Note (Signed)
Followed by dermatology

## 2015-10-06 NOTE — Assessment & Plan Note (Signed)
Low cholesterol diet and exercise.  Follow lipid panel.   

## 2015-10-06 NOTE — Assessment & Plan Note (Signed)
Diet and exercise.  Follow.  

## 2015-10-06 NOTE — Assessment & Plan Note (Signed)
EGD 11/28/12.  Due f/u EGD 11/2015.   

## 2015-11-03 ENCOUNTER — Other Ambulatory Visit: Payer: Self-pay | Admitting: Internal Medicine

## 2015-12-19 DIAGNOSIS — H2512 Age-related nuclear cataract, left eye: Secondary | ICD-10-CM | POA: Diagnosis not present

## 2015-12-22 ENCOUNTER — Other Ambulatory Visit (INDEPENDENT_AMBULATORY_CARE_PROVIDER_SITE_OTHER): Payer: Medicare Other

## 2015-12-22 DIAGNOSIS — C61 Malignant neoplasm of prostate: Secondary | ICD-10-CM

## 2015-12-22 DIAGNOSIS — E7800 Pure hypercholesterolemia, unspecified: Secondary | ICD-10-CM | POA: Diagnosis not present

## 2015-12-22 DIAGNOSIS — I10 Essential (primary) hypertension: Secondary | ICD-10-CM | POA: Diagnosis not present

## 2015-12-22 LAB — LIPID PANEL
CHOLESTEROL: 195 mg/dL (ref 0–200)
HDL: 46.5 mg/dL (ref >39.00)
LDL CALC: 114 mg/dL — AB (ref 0–99)
NonHDL: 148.21
Total CHOL/HDL Ratio: 4
Triglycerides: 169 mg/dL — ABNORMAL HIGH (ref 0.0–149.0)
VLDL: 33.8 mg/dL (ref 0.0–40.0)

## 2015-12-22 LAB — BASIC METABOLIC PANEL
BUN: 20 mg/dL (ref 6–23)
CALCIUM: 9.6 mg/dL (ref 8.4–10.5)
CHLORIDE: 103 meq/L (ref 96–112)
CO2: 30 meq/L (ref 19–32)
Creatinine, Ser: 1.33 mg/dL (ref 0.40–1.50)
GFR: 55.19 mL/min — ABNORMAL LOW (ref >60.00)
GLUCOSE: 103 mg/dL — AB (ref 70–99)
Potassium: 4.2 mEq/L (ref 3.5–5.1)
SODIUM: 142 meq/L (ref 135–145)

## 2015-12-22 LAB — HEPATIC FUNCTION PANEL
ALT: 36 U/L (ref 0–53)
AST: 33 U/L (ref 0–37)
Albumin: 4.3 g/dL (ref 3.5–5.2)
Alkaline Phosphatase: 51 U/L (ref 39–117)
BILIRUBIN TOTAL: 0.7 mg/dL (ref 0.2–1.2)
Bilirubin, Direct: 0.2 mg/dL (ref 0.0–0.3)
Total Protein: 6.7 g/dL (ref 6.0–8.3)

## 2015-12-22 LAB — PSA, MEDICARE: PSA: 0 ng/ml — ABNORMAL LOW (ref 0.10–4.00)

## 2015-12-23 ENCOUNTER — Encounter: Payer: Self-pay | Admitting: Internal Medicine

## 2015-12-26 ENCOUNTER — Ambulatory Visit: Payer: Medicare Other | Admitting: Internal Medicine

## 2016-01-06 ENCOUNTER — Encounter: Payer: Self-pay | Admitting: Internal Medicine

## 2016-01-06 ENCOUNTER — Ambulatory Visit (INDEPENDENT_AMBULATORY_CARE_PROVIDER_SITE_OTHER): Payer: Medicare Other | Admitting: Internal Medicine

## 2016-01-06 VITALS — BP 140/70 | HR 64 | Temp 97.9°F | Wt 246.0 lb

## 2016-01-06 DIAGNOSIS — K2270 Barrett's esophagus without dysplasia: Secondary | ICD-10-CM

## 2016-01-06 DIAGNOSIS — C439 Malignant melanoma of skin, unspecified: Secondary | ICD-10-CM

## 2016-01-06 DIAGNOSIS — E669 Obesity, unspecified: Secondary | ICD-10-CM

## 2016-01-06 DIAGNOSIS — E7800 Pure hypercholesterolemia, unspecified: Secondary | ICD-10-CM

## 2016-01-06 DIAGNOSIS — C61 Malignant neoplasm of prostate: Secondary | ICD-10-CM

## 2016-01-06 DIAGNOSIS — I10 Essential (primary) hypertension: Secondary | ICD-10-CM | POA: Diagnosis not present

## 2016-01-06 NOTE — Progress Notes (Signed)
Pre visit review using our clinic review tool, if applicable. No additional management support is needed unless otherwise documented below in the visit note. 

## 2016-01-06 NOTE — Progress Notes (Signed)
Patient ID: RIAL SABIR, male   DOB: 10-01-1934, 81 y.o.   MRN: [id number]   Subjective:    [name], male    DOB: 1934/09/24, 81 y.o.   MRN: [id number]  HPI  Patient here for a scheduled follow up.  States he is doing better.  Recently had flare with diverticulitis.  Adjusted his diet.  Doing better now.  No pain.  Eating and drinking.  No nausea or vomiting.  Bowels stable.  No urine change.  Sees dermatology.  Discussed labs.  Discussed diet and exercise.     Past Medical History  Diagnosis Date  . Arthritis   . Cancer ([place])     prostate,skin  . Chicken pox   . Diverticulitis   . Ulcer   . Hypertension   . Sleep apnea   . Hyperlipidemia   . Melanoma ([place])     Malignant resection  . Anemia   . GERD (gastroesophageal reflux disease)    Past Surgical History  Procedure Laterality Date  . Prostate surgery    . Cystocopy  2003  . Hemorrhoid surgery    . Cardiac catheterization    . Sleep study    . Colon surgery  2006-2008-2011    polyps removed  . Cataract surgery Right 02/13/14   Family History  Problem Relation Age of Onset  . Heart disease Father   . Kidney disease Father    Social History   Social History  . Marital Status: Married    Spouse Name: N/A  . Number of Children: N/A  . Years of Education: N/A   Social History Main Topics  . Smoking status: Never Smoker   . Smokeless tobacco: Never Used  . Alcohol Use: No  . Drug Use: No  . Sexual Activity: No   Other Topics Concern  . None   Social History Narrative    Outpatient Encounter Prescriptions as of 01/06/2016  Medication Sig  . acetaminophen (TYLENOL) 500 MG tablet Take 500 mg by mouth every 6 (six) hours as needed.  Marland Kitchen amLODipine (NORVASC) 5 MG tablet TAKE 1 TABLET BY MOUTH EVERY DAY.  Marland Kitchen aspirin 81 MG tablet Take 81 mg by mouth daily.  Sarajane Marek Sodium 30-100 MG CAPS Take by mouth.  . fluticasone (FLONASE) 50 MCG/ACT nasal spray TWO PUFFS IN EACH NOSTRIL ONCE A  DAY  . hydrochlorothiazide (HYDRODIURIL) 25 MG tablet TAKE 1 TABLET BY MOUTH EVERY DAY.  . hyoscyamine (LEVSIN, ANASPAZ) 0.125 MG tablet Take 0.125 mg by mouth as needed.  . irbesartan (AVAPRO) 300 MG tablet TAKE 1 TABLET BY MOUTH EVERY DAY.  Marland Kitchen L-Lysine 500 MG TABS Take by mouth daily.  Marland Kitchen levothyroxine (SYNTHROID, LEVOTHROID) 88 MCG tablet TAKE 1 TABLET BY MOUTH EVERY DAY.  Marland Kitchen omeprazole (PRILOSEC) 20 MG capsule TAKE ONE CAPSULE TWICE A DAY  . traMADol (ULTRAM) 50 MG tablet TAKE ONE TABLET TWICE DAILY AS NEEDED  . valACYclovir (VALTREX) 1000 MG tablet TAKE 1 TABLET BY MOUTH EVERY DAY.   No facility-administered encounter medications on file as of 01/06/2016.    Review of Systems  Constitutional: Negative for appetite change and unexpected weight change.  HENT: Negative for congestion and sinus pressure.   Respiratory: Negative for cough, chest tightness and shortness of breath.   Cardiovascular: Negative for chest pain, palpitations and leg swelling.  Gastrointestinal: Negative for nausea, vomiting and diarrhea.       Previous abdominal pain resolved.    Genitourinary: Negative for dysuria and  difficulty urinating.  Musculoskeletal: Negative for myalgias and joint swelling.  Skin: Negative for color change and rash.  Neurological: Negative for dizziness, light-headedness and headaches.  Psychiatric/Behavioral: Negative for dysphoric mood and agitation.       Objective:    Physical Exam  Constitutional: He appears well-developed and well-nourished. No distress.  HENT:  Nose: Nose normal.  Mouth/Throat: Oropharynx is clear and moist.  Neck: Neck supple. No thyromegaly present.  Cardiovascular: Normal rate and regular rhythm.   Pulmonary/Chest: Effort normal and breath sounds normal. No respiratory distress.  Abdominal: Soft. Bowel sounds are normal. There is no tenderness.  Musculoskeletal: He exhibits no edema or tenderness.  Lymphadenopathy:    He has no cervical adenopathy.    Skin: No rash noted. No erythema.  Psychiatric: He has a normal mood and affect. His behavior is normal.    BP 140/70 mmHg  Pulse 64  Temp(Src) 97.9 F (36.6 C) (Oral)  Wt 246 lb (111.585 kg) Wt Readings from Last 3 Encounters:  01/06/16 246 lb (111.585 kg)  09/23/15 245 lb 6 oz (111.301 kg)  06/23/15 241 lb (109.317 kg)     Lab Results  Component Value Date   WBC 8.1 07/15/2015   HGB 15.2 07/15/2015   HCT 45.7 07/15/2015   PLT 237.0 07/15/2015   GLUCOSE 103* 12/22/2015   CHOL 195 12/22/2015   TRIG 169.0* 12/22/2015   HDL 46.50 12/22/2015   LDLDIRECT 100.0 10/23/2014   LDLCALC 114* 12/22/2015   ALT 36 12/22/2015   AST 33 12/22/2015   NA 142 12/22/2015   K 4.2 12/22/2015   CL 103 12/22/2015   CREATININE 1.33 12/22/2015   BUN 20 12/22/2015   CO2 30 12/22/2015   TSH 2.08 06/23/2015   PSA 0.00* 12/22/2015   HGBA1C 5.3 09/06/2012    Dg Chest 2 View  06/04/2015  CLINICAL DATA:  Cough, congestion and wheezing. EXAM: CHEST  2 VIEW COMPARISON:  Report from 11/13/1997 chest radiograph (images not available). FINDINGS: Top-normal heart size. Atherosclerotic aortic arch. Otherwise normal mediastinal contour. No pneumothorax. No pleural effusion. Clear lungs, with no focal lung consolidation and no pulmonary edema. IMPRESSION: No active cardiopulmonary disease. Electronically Signed   By: Ilona Sorrel M.D.   On: 06/04/2015 13:09       Assessment & Plan:   Problem List Items Addressed This Visit    Barrett's esophagus    Followed by GI.  Due EGD this year.        Hypercholesterolemia    Low cholesterol diet and exercise.  Follow lipid panel.        Relevant Orders   Lipid panel   Hepatic function panel   Hypertension - Primary    Blood pressure rechecked by me today - 134/78.  Continue same medication regimen.  Follow pressures.  Follow metabolic panel.        Relevant Orders   Basic metabolic panel   Malignant melanoma ([place])    Followed by dermatology.         Obesity (BMI 30-39.9)    Diet and exercise.  Follow.        Prostate cancer [facility])    History of prostate cancer s/p prostatectomy.  Follow psa.  Recent check 0.0.            Einar Pheasant, MD

## 2016-01-11 ENCOUNTER — Encounter: Payer: Self-pay | Admitting: Internal Medicine

## 2016-01-11 NOTE — Assessment & Plan Note (Signed)
Low cholesterol diet and exercise.  Follow lipid panel.   

## 2016-01-11 NOTE — Assessment & Plan Note (Signed)
Diet and exercise.  Follow.  

## 2016-01-11 NOTE — Assessment & Plan Note (Signed)
Blood pressure rechecked by me today - 134/78.  Continue same medication regimen.  Follow pressures.  Follow metabolic panel.

## 2016-01-11 NOTE — Assessment & Plan Note (Signed)
Followed by GI.  Due EGD this year.

## 2016-01-11 NOTE — Assessment & Plan Note (Signed)
Followed by dermatology

## 2016-01-11 NOTE — Assessment & Plan Note (Signed)
History of prostate cancer s/p prostatectomy.  Follow psa.  Recent check 0.0.

## 2016-04-13 ENCOUNTER — Ambulatory Visit (INDEPENDENT_AMBULATORY_CARE_PROVIDER_SITE_OTHER): Payer: Medicare Other | Admitting: Internal Medicine

## 2016-04-13 ENCOUNTER — Encounter: Payer: Self-pay | Admitting: Internal Medicine

## 2016-04-13 DIAGNOSIS — E7800 Pure hypercholesterolemia, unspecified: Secondary | ICD-10-CM

## 2016-04-13 DIAGNOSIS — I10 Essential (primary) hypertension: Secondary | ICD-10-CM

## 2016-04-13 DIAGNOSIS — K2270 Barrett's esophagus without dysplasia: Secondary | ICD-10-CM | POA: Diagnosis not present

## 2016-04-13 DIAGNOSIS — Z86010 Personal history of colonic polyps: Secondary | ICD-10-CM

## 2016-04-13 DIAGNOSIS — Z23 Encounter for immunization: Secondary | ICD-10-CM | POA: Diagnosis not present

## 2016-04-13 DIAGNOSIS — C439 Malignant melanoma of skin, unspecified: Secondary | ICD-10-CM

## 2016-04-13 DIAGNOSIS — G4730 Sleep apnea, unspecified: Secondary | ICD-10-CM | POA: Diagnosis not present

## 2016-04-13 DIAGNOSIS — C61 Malignant neoplasm of prostate: Secondary | ICD-10-CM | POA: Diagnosis not present

## 2016-04-13 DIAGNOSIS — E669 Obesity, unspecified: Secondary | ICD-10-CM

## 2016-04-13 NOTE — Progress Notes (Signed)
Pre visit review using our clinic review tool, if applicable. No additional management support is needed unless otherwise documented below in the visit note. 

## 2016-04-13 NOTE — Assessment & Plan Note (Addendum)
Followed by GI.  Due EGD.  Discussed with him today.  Refer to GI.

## 2016-04-13 NOTE — Progress Notes (Signed)
Patient ID: [name], male   DOB: February 10, 1935, 81 y.o.   MRN: [id number]   Subjective:    [name], male    DOB: 1935-04-19, 81 y.o.   MRN: [id number]  HPI  Patient here for a scheduled follow up.  He feels overall things are stable.  Breathing stable.  Tries to stay active.  Still with back pain.  Chronic.  Feels is stable.  No nausea or vomiting.  Bowels stable now.  States had another flare with his bowels.  Resolved now.  States he does ok if he watches what he eats.  Does notice occasional BRBPR.  Relates to hemorrhoids.  Blood pressures averaging 130--140s/50-60s.     Past Medical History:  Diagnosis Date  . Anemia   . Arthritis   . Cancer ([place])    prostate,skin  . Chicken pox   . Diverticulitis   . GERD (gastroesophageal reflux disease)   . Hyperlipidemia   . Hypertension   . Melanoma ([place])    Malignant resection  . Sleep apnea   . Ulcer    Past Surgical History:  Procedure Laterality Date  . CARDIAC CATHETERIZATION    . Cataract Surgery Right 02/13/14  . COLON SURGERY  2006-2008-2011   polyps removed  . cystocopy  2003  . HEMORRHOID SURGERY    . PROSTATE SURGERY    . sleep study     Family History  Problem Relation Age of Onset  . Heart disease Father   . Kidney disease Father    Social History   Social History  . Marital status: Married    Spouse name: N/A  . Number of children: N/A  . Years of education: N/A   Social History Main Topics  . Smoking status: Never Smoker  . Smokeless tobacco: Never Used  . Alcohol use No  . Drug use: No  . Sexual activity: No   Other Topics Concern  . None   Social History Narrative  . None    Outpatient Encounter Prescriptions as of 04/13/2016  Medication Sig  . acetaminophen (TYLENOL) 500 MG tablet Take 500 mg by mouth every 6 (six) hours as needed.  Marland Kitchen amLODipine (NORVASC) 5 MG tablet TAKE 1 TABLET BY MOUTH EVERY DAY.  Marland Kitchen aspirin 81 MG tablet Take 81 mg by mouth daily.  Sarajane Marek Sodium 30-100 MG CAPS Take by mouth.  . fluticasone (FLONASE) 50 MCG/ACT nasal spray TWO PUFFS IN EACH NOSTRIL ONCE A DAY  . hydrochlorothiazide (HYDRODIURIL) 25 MG tablet TAKE 1 TABLET BY MOUTH EVERY DAY.  . hyoscyamine (LEVSIN, ANASPAZ) 0.125 MG tablet Take 0.125 mg by mouth as needed.  . irbesartan (AVAPRO) 300 MG tablet TAKE 1 TABLET BY MOUTH EVERY DAY.  Marland Kitchen L-Lysine 500 MG TABS Take by mouth daily.  Marland Kitchen levothyroxine (SYNTHROID, LEVOTHROID) 88 MCG tablet TAKE 1 TABLET BY MOUTH EVERY DAY.  Marland Kitchen omeprazole (PRILOSEC) 20 MG capsule TAKE ONE CAPSULE TWICE A DAY  . traMADol (ULTRAM) 50 MG tablet TAKE ONE TABLET TWICE DAILY AS NEEDED  . valACYclovir (VALTREX) 1000 MG tablet TAKE 1 TABLET BY MOUTH EVERY DAY.   No facility-administered encounter medications on file as of 04/13/2016.     Review of Systems  Constitutional: Negative for appetite change and unexpected weight change.  HENT: Negative for congestion and sinus pressure.   Respiratory: Negative for cough, chest tightness and shortness of breath.   Cardiovascular: Negative for chest pain, palpitations and leg swelling.  Gastrointestinal: Positive for blood  in stool. Negative for abdominal pain, nausea and vomiting.  Genitourinary: Negative for difficulty urinating and dysuria.  Musculoskeletal: Positive for back pain. Negative for myalgias.  Skin: Negative for color change and rash.  Neurological: Negative for dizziness, light-headedness and headaches.  Psychiatric/Behavioral: Negative for agitation and dysphoric mood.       Objective:     Blood pressure rechecked by me:  138/68  Physical Exam  Constitutional: He appears well-developed and well-nourished. No distress.  HENT:  Nose: Nose normal.  Mouth/Throat: Oropharynx is clear and moist.  Neck: Neck supple. No thyromegaly present.  Cardiovascular: Normal rate and regular rhythm.   Pulmonary/Chest: Effort normal and breath sounds normal. No respiratory  distress.  Abdominal: Soft. Bowel sounds are normal. There is no tenderness.  Musculoskeletal: He exhibits no edema or tenderness.  Lymphadenopathy:    He has no cervical adenopathy.  Skin: No rash noted. No erythema.  Psychiatric: He has a normal mood and affect. His behavior is normal.    BP 130/70   Pulse 65   Temp 97.7 F (36.5 C) (Oral)   Ht 5\' 8"  (1.727 m)   Wt 249 lb 12.8 oz (113.3 kg)   SpO2 95%   BMI 37.98 kg/m  Wt Readings from Last 3 Encounters:  04/13/16 249 lb 12.8 oz (113.3 kg)  01/06/16 246 lb (111.6 kg)  09/23/15 245 lb 6 oz (111.3 kg)     Lab Results  Component Value Date   WBC 8.1 07/15/2015   HGB 15.2 07/15/2015   HCT 45.7 07/15/2015   PLT 237.0 07/15/2015   GLUCOSE 103 (H) 12/22/2015   CHOL 195 12/22/2015   TRIG 169.0 (H) 12/22/2015   HDL 46.50 12/22/2015   LDLDIRECT 100.0 10/23/2014   LDLCALC 114 (H) 12/22/2015   ALT 36 12/22/2015   AST 33 12/22/2015   NA 142 12/22/2015   K 4.2 12/22/2015   CL 103 12/22/2015   CREATININE 1.33 12/22/2015   BUN 20 12/22/2015   CO2 30 12/22/2015   TSH 2.08 06/23/2015   PSA 0.00 (L) 12/22/2015   HGBA1C 5.3 09/06/2012    Dg Chest 2 View  Result Date: 06/04/2015 CLINICAL DATA:  Cough, congestion and wheezing. EXAM: CHEST  2 VIEW COMPARISON:  Report from 11/13/1997 chest radiograph (images not available). FINDINGS: Top-normal heart size. Atherosclerotic aortic arch. Otherwise normal mediastinal contour. No pneumothorax. No pleural effusion. Clear lungs, with no focal lung consolidation and no pulmonary edema. IMPRESSION: No active cardiopulmonary disease. Electronically Signed   By: Hector [name] M.D.   On: 06/04/2015 13:09       Assessment & Plan:   Problem List Items Addressed This Visit    Barrett's esophagus    Followed by GI.  Due EGD.  Discussed with him today.  Refer to GI.       Relevant Orders   Ambulatory referral to Gastroenterology   History of colonic polyps    Colonoscopy 11/28/12 as  outlined.  Repeat colonoscopy /2019.        Hypercholesterolemia    Low cholesterol diet and exercise.  Follow lipid panel.        Hypertension    Blood pressure as outlined.  Same medication regimen.  Follow pressures.        Malignant melanoma ([place])    Followed by dermatology.        Obesity (BMI 30-39.9)    Diet and exercise.  Follow.       Prostate cancer [facility])    History of  prostate cancer s/p prostatectomy.  Follow psa.        Sleep apnea    Unable to tolerate the mask.  Declines a follow trial.  Avoid sleeping supine.  Avoid sedating medication.         Other Visit Diagnoses    Encounter for immunization       Relevant Orders   Flu vaccine HIGH DOSE PF (Completed)       Einar Pheasant, MD

## 2016-04-18 ENCOUNTER — Encounter: Payer: Self-pay | Admitting: Internal Medicine

## 2016-04-18 NOTE — Assessment & Plan Note (Signed)
Followed by dermatology

## 2016-04-18 NOTE — Assessment & Plan Note (Signed)
Low cholesterol diet and exercise.  Follow lipid panel.   

## 2016-04-18 NOTE — Assessment & Plan Note (Signed)
Blood pressure as outlined.  Same medication regimen.  Follow pressures.   

## 2016-04-18 NOTE — Assessment & Plan Note (Addendum)
Unable to tolerate the mask.  Declines a follow trial.  Avoid sleeping supine.  Avoid sedating medication.

## 2016-04-18 NOTE — Assessment & Plan Note (Signed)
History of prostate cancer.  s/p prostatectomy.  Follow psa.   

## 2016-04-18 NOTE — Assessment & Plan Note (Signed)
Colonoscopy 11/28/12 as outlined.  Repeat colonoscopy /2019.

## 2016-04-18 NOTE — Assessment & Plan Note (Signed)
Diet and exercise.  Follow.  

## 2016-04-21 ENCOUNTER — Encounter: Payer: Self-pay | Admitting: Internal Medicine

## 2016-05-13 ENCOUNTER — Ambulatory Visit: Payer: Medicare Other

## 2016-05-18 DIAGNOSIS — K2270 Barrett's esophagus without dysplasia: Secondary | ICD-10-CM | POA: Diagnosis not present

## 2016-05-24 ENCOUNTER — Ambulatory Visit (INDEPENDENT_AMBULATORY_CARE_PROVIDER_SITE_OTHER): Payer: Medicare Other

## 2016-05-24 VITALS — BP 132/70 | HR 60 | Temp 98.1°F | Resp 14 | Ht 68.0 in | Wt 244.6 lb

## 2016-05-24 DIAGNOSIS — Z23 Encounter for immunization: Secondary | ICD-10-CM

## 2016-05-24 DIAGNOSIS — Z0000 Encounter for general adult medical examination without abnormal findings: Secondary | ICD-10-CM

## 2016-05-24 NOTE — Progress Notes (Signed)
Subjective:   Hector [name] is a 81 y.o. male who presents for Medicare Annual/Subsequent preventive examination.  Review of Systems:  No ROS.  Medicare Wellness Visit.  Cardiac Risk Factors include: advanced age (>8men, >10 women);obesity (BMI >30kg/m2);hypertension;male gender     Objective:    Vitals: BP 132/70 (BP Location: Left Arm, Patient Position: Sitting, Cuff Size: Normal)   Pulse 60   Temp 98.1 F (36.7 C) (Oral)   Resp 14   Ht 5\' 8"  (1.727 m)   Wt 244 lb 9.6 oz (110.9 kg)   SpO2 98%   BMI 37.19 kg/m   Body mass index is 37.19 kg/m.  Tobacco History  Smoking Status  . Never Smoker  Smokeless Tobacco  . Never Used     Counseling given: Not Answered   Past Medical History:  Diagnosis Date  . Anemia   . Arthritis   . Cancer ([place])    prostate,skin  . Chicken pox   . Diverticulitis   . GERD (gastroesophageal reflux disease)   . Hyperlipidemia   . Hypertension   . Melanoma ([place])    Malignant resection  . Sleep apnea   . Ulcer [facility])    Past Surgical History:  Procedure Laterality Date  . CARDIAC CATHETERIZATION    . Cataract Surgery Right 02/13/14  . COLON SURGERY  2006-2008-2011   polyps removed  . cystocopy  2003  . HEMORRHOID SURGERY    . PROSTATE SURGERY    . sleep study     Family History  Problem Relation Age of Onset  . Heart disease Father   . Kidney disease Father    History  Sexual Activity  . Sexual activity: No    Outpatient Encounter Prescriptions as of 05/24/2016  Medication Sig  . acetaminophen (TYLENOL) 500 MG tablet Take 500 mg by mouth every 6 (six) hours as needed.  Marland Kitchen amLODipine (NORVASC) 5 MG tablet TAKE 1 TABLET BY MOUTH EVERY DAY.  Marland Kitchen aspirin 81 MG tablet Take 81 mg by mouth daily.  Sarajane Marek Sodium 30-100 MG CAPS Take by mouth.  . fluticasone (FLONASE) 50 MCG/ACT nasal spray TWO PUFFS IN EACH NOSTRIL ONCE A DAY  . hydrochlorothiazide (HYDRODIURIL) 25 MG tablet TAKE 1 TABLET BY MOUTH EVERY DAY.  .  hyoscyamine (LEVSIN, ANASPAZ) 0.125 MG tablet Take 0.125 mg by mouth as needed.  . irbesartan (AVAPRO) 300 MG tablet TAKE 1 TABLET BY MOUTH EVERY DAY.  Marland Kitchen L-Lysine 500 MG TABS Take by mouth daily.  Marland Kitchen levothyroxine (SYNTHROID, LEVOTHROID) 88 MCG tablet TAKE 1 TABLET BY MOUTH EVERY DAY.  Marland Kitchen omeprazole (PRILOSEC) 20 MG capsule TAKE ONE CAPSULE TWICE A DAY  . traMADol (ULTRAM) 50 MG tablet TAKE ONE TABLET TWICE DAILY AS NEEDED  . valACYclovir (VALTREX) 1000 MG tablet TAKE 1 TABLET BY MOUTH EVERY DAY.   No facility-administered encounter medications on file as of 05/24/2016.     Activities of Daily Living In your present state of health, do you have any difficulty performing the following activities: 05/24/2016  Hearing? Y  Vision? N  Difficulty concentrating or making decisions? N  Walking or climbing stairs? Y  Dressing or bathing? N  Doing errands, shopping? N  Preparing Food and eating ? N  Using the Toilet? N  In the past six months, have you accidently leaked urine? N  Do you have problems with loss of bowel control? N  Managing your Medications? N  Managing your Finances? N  Housekeeping or managing your Housekeeping? N  Some recent data might be hidden    Patient Care Team: Einar Pheasant, MD as PCP - General (Internal Medicine) Einar Pheasant, MD (Internal Medicine)   Assessment:    This is a routine wellness examination for Hector [name]. The goal of the wellness visit is to assist the patient how to close the gaps in care and create a preventative care plan for the patient.   Osteoporosis risk reviewed.  Medications reviewed; taking without issues or barriers.  Safety issues reviewed; smoke detectors in the home. Firearms locked in a safe in the home. Wears seatbelts when driving or riding with others. No violence in the home.  No identified risk were noted; The patient was oriented x 3; appropriate in dress and manner and no objective failures at ADL's or IADL's.    Body mass index; discussed the importance of a healthy diet, water intake and exercise. Educational material provided.  Pneumococcal 23 vaccine administered R deltoid, tolerated well.  Health maintenance gaps; closed.  Patient Concerns: None at this time. Follow up with PCP as needed.  Exercise Activities and Dietary recommendations Current Exercise Habits: Home exercise routine (Active around the house.), Type of exercise: walking, Time (Minutes): 20, Intensity: Mild  Goals    . Increase physical activity          Chair exercises    . Increase water intake          Increase water intake up to 2 cups per day      Fall Risk Fall Risk  05/24/2016 04/13/2016 05/13/2015 02/20/2015 04/17/2014  Falls in the past year? Yes No Yes Yes No  Number falls in past yr: 1 - 1 1 -  Injury with Fall? Yes - Yes Yes -  Follow up Falls prevention discussed;Education provided - Education provided;Falls prevention discussed - -   Depression Screen PHQ 2/9 Scores 05/24/2016 04/13/2016 05/13/2015 02/20/2015  PHQ - 2 Score 0 0 0 0    Cognitive Function MMSE - Mini Mental State Exam 05/24/2016 05/13/2015  Orientation to time 5 5  Orientation to Place 5 5  Registration 3 3  Attention/ Calculation 5 5  Recall 3 3  Language- name 2 objects 2 2  Language- repeat 1 1  Language- follow 3 step command 3 3  Language- read & follow direction 1 1  Write a sentence 1 1  Copy design 1 1  Total score 30 30        Immunization History  Administered Date(s) Administered  . Influenza Split 05/03/2012, 05/01/2014  . Influenza, High Dose Seasonal PF 04/13/2016  . Influenza,inj,Quad PF,36+ Mos 05/13/2015  . Pneumococcal Conjugate-13 04/17/2014  . Pneumococcal Polysaccharide-23 05/24/2016  . Td 04/17/2014  . Zoster 05/02/2010   Screening Tests Health Maintenance  Topic Date Due  . TETANUS/TDAP  04/17/2024  . INFLUENZA VACCINE  Addressed  . ZOSTAVAX  Completed  . PNA vac Low Risk Adult   Completed      Plan:    End of life planning; Advance aging; Advanced directives discussed. Copy of current HCPOA/Living Will on file.  Medicare Attestation I have personally reviewed: The patient's medical and social history Their use of alcohol, tobacco or illicit drugs Their current medications and supplements The patient's functional ability including ADLs,fall risks, home safety risks, cognitive, and hearing and visual impairment Diet and physical activities Evidence for depression   The patient's weight, height, BMI, and visual acuity have been recorded in the chart.  I have made referrals and provided education to  the patient based on review of the above and I have provided the patient with a written personalized care plan for preventive services.    During the course of the visit the patient was educated and counseled about the following appropriate screening and preventive services:   Vaccines to include Pneumoccal, Influenza, Hepatitis B, Td, Zostavax, HCV  Electrocardiogram  Cardiovascular Disease  Colorectal cancer screening  Diabetes screening  Prostate Cancer Screening  Glaucoma screening  Nutrition counseling   Smoking cessation counseling  Patient Instructions (the written plan) was given to the patient.    Varney Biles, LPN  [id number]  Reviewed above.  Agree with assessment and plan.   Dr Nicki Reaper

## 2016-05-24 NOTE — Patient Instructions (Addendum)
Hector [name] , Thank you for taking time to come for your Medicare Wellness Visit. I appreciate your ongoing commitment to your health goals. Please review the following plan we discussed and let me know if I can assist you in the future.   FOLLOWED BY DR. Nicki Reaper AS NEEDED.  These are the goals we discussed: Goals    . Increase physical activity          Chair exercises    . Increase water intake          Increase water intake up to 2 cups per day       This is a list of the screening recommended for you and due dates:  Health Maintenance  Topic Date Due  . Tetanus Vaccine  04/17/2024  . Flu Shot  Addressed  . Shingles Vaccine  Completed  . Pneumonia vaccines  Completed   Fall Prevention in the Home  Falls can cause injuries. They can happen to people of all ages. There are many things you can do to make your home safe and to help prevent falls.  WHAT CAN I DO ON THE OUTSIDE OF MY HOME?  Regularly fix the edges of walkways and driveways and fix any cracks.  Remove anything that might make you trip as you walk through a door, such as a raised step or threshold.  Trim any bushes or trees on the path to your home.  Use bright outdoor lighting.  Clear any walking paths of anything that might make someone trip, such as rocks or tools.  Regularly check to see if handrails are loose or broken. Make sure that both sides of any steps have handrails.  Any raised decks and porches should have guardrails on the edges.  Have any leaves, snow, or ice cleared regularly.  Use sand or salt on walking paths during winter.  Clean up any spills in your garage right away. This includes oil or grease spills. WHAT CAN I DO IN THE BATHROOM?   Use night lights.  Install grab bars by the toilet and in the tub and shower. Do not use towel bars as grab bars.  Use non-skid mats or decals in the tub or shower.  If you need to sit down in the shower, use a plastic, non-slip stool.  Keep the  floor dry. Clean up any water that spills on the floor as soon as it happens.  Remove soap buildup in the tub or shower regularly.  Attach bath mats securely with double-sided non-slip rug tape.  Do not have throw rugs and other things on the floor that can make you trip. WHAT CAN I DO IN THE BEDROOM?  Use night lights.  Make sure that you have a light by your bed that is easy to reach.  Do not use any sheets or blankets that are too big for your bed. They should not hang down onto the floor.  Have a firm chair that has side arms. You can use this for support while you get dressed.  Do not have throw rugs and other things on the floor that can make you trip. WHAT CAN I DO IN THE KITCHEN?  Clean up any spills right away.  Avoid walking on wet floors.  Keep items that you use a lot in easy-to-reach places.  If you need to reach something above you, use a strong step stool that has a grab bar.  Keep electrical cords out of the way.  Do not  use floor polish or wax that makes floors slippery. If you must use wax, use non-skid floor wax.  Do not have throw rugs and other things on the floor that can make you trip. WHAT CAN I DO WITH MY STAIRS?  Do not leave any items on the stairs.  Make sure that there are handrails on both sides of the stairs and use them. Fix handrails that are broken or loose. Make sure that handrails are as long as the stairways.  Check any carpeting to make sure that it is firmly attached to the stairs. Fix any carpet that is loose or worn.  Avoid having throw rugs at the top or bottom of the stairs. If you do have throw rugs, attach them to the floor with carpet tape.  Make sure that you have a light switch at the top of the stairs and the bottom of the stairs. If you do not have them, ask someone to add them for you. WHAT ELSE CAN I DO TO HELP PREVENT FALLS?  Wear shoes that:  Do not have high heels.  Have rubber bottoms.  Are comfortable and fit  you well.  Are closed at the toe. Do not wear sandals.  If you use a stepladder:  Make sure that it is fully opened. Do not climb a closed stepladder.  Make sure that both sides of the stepladder are locked into place.  Ask someone to hold it for you, if possible.  Clearly mark and make sure that you can see:  Any grab bars or handrails.  First and last steps.  Where the edge of each step is.  Use tools that help you move around (mobility aids) if they are needed. These include:  Canes.  Walkers.  Scooters.  Crutches.  Turn on the lights when you go into a dark area. Replace any light bulbs as soon as they burn out.  Set up your furniture so you have a clear path. Avoid moving your furniture around.  If any of your floors are uneven, fix them.  If there are any pets around you, be aware of where they are.  Review your medicines with your doctor. Some medicines can make you feel dizzy. This can increase your chance of falling. Ask your doctor what other things that you can do to help prevent falls.   This information is not intended to replace advice given to you by your health care provider. Make sure you discuss any questions you have with your health care provider.   Document Released: 05/15/2009 Document Revised: 12/03/2014 Document Reviewed: 08/23/2014 Elsevier Interactive Patient Education [employer].

## 2016-05-25 DIAGNOSIS — L570 Actinic keratosis: Secondary | ICD-10-CM | POA: Diagnosis not present

## 2016-05-25 DIAGNOSIS — D1801 Hemangioma of skin and subcutaneous tissue: Secondary | ICD-10-CM | POA: Diagnosis not present

## 2016-05-25 DIAGNOSIS — B078 Other viral warts: Secondary | ICD-10-CM | POA: Diagnosis not present

## 2016-05-25 DIAGNOSIS — Z85820 Personal history of malignant melanoma of skin: Secondary | ICD-10-CM | POA: Diagnosis not present

## 2016-05-25 DIAGNOSIS — D485 Neoplasm of uncertain behavior of skin: Secondary | ICD-10-CM | POA: Diagnosis not present

## 2016-05-25 DIAGNOSIS — L821 Other seborrheic keratosis: Secondary | ICD-10-CM | POA: Diagnosis not present

## 2016-05-25 DIAGNOSIS — Z85828 Personal history of other malignant neoplasm of skin: Secondary | ICD-10-CM | POA: Diagnosis not present

## 2016-05-25 DIAGNOSIS — L814 Other melanin hyperpigmentation: Secondary | ICD-10-CM | POA: Diagnosis not present

## 2016-05-27 ENCOUNTER — Ambulatory Visit (INDEPENDENT_AMBULATORY_CARE_PROVIDER_SITE_OTHER): Payer: Medicare Other | Admitting: Internal Medicine

## 2016-05-27 ENCOUNTER — Encounter: Payer: Self-pay | Admitting: Internal Medicine

## 2016-05-27 DIAGNOSIS — J069 Acute upper respiratory infection, unspecified: Secondary | ICD-10-CM

## 2016-05-27 DIAGNOSIS — I10 Essential (primary) hypertension: Secondary | ICD-10-CM | POA: Diagnosis not present

## 2016-05-27 MED ORDER — PREDNISONE 10 MG PO TABS: ORAL_TABLET | ORAL | 0 refills | Status: DC

## 2016-05-27 NOTE — Progress Notes (Signed)
Pre visit review using our clinic review tool, if applicable. No additional management support is needed unless otherwise documented below in the visit note. 

## 2016-05-27 NOTE — Patient Instructions (Signed)
Saline nasal spray - flush nose at least 2-3x/day  nasacort nasal spray - 2 sprays each nostril one time per day.  Do this in the evening.   Robitussin - twice a day as needed.   

## 2016-05-27 NOTE — Progress Notes (Signed)
[name], male   DOB: Dec 29, 1934, 81 y.o.   MRN: [id number]   Subjective:    [name], male    DOB: [birth year], 81 y.o.   MRN: [id number]  HPI  Patient here as a work in with concerns regarding increased congestion and cough.  States symptoms started two weeks ago.  Reports initially felt increased head congestion and felt like in a barrell.  This has improved.  Some colored mucus production.  Still with some cough.  Wheezing is better.  Feels better.  No fever. No nausea or vomiting.  Bowels stable.     Past Medical History:  Diagnosis Date  . Anemia   . Arthritis   . Cancer ([place])    prostate,skin  . Chicken pox   . Diverticulitis   . GERD (gastroesophageal reflux disease)   . Hyperlipidemia   . Hypertension   . Melanoma ([place])    Malignant resection  . Sleep apnea   . Ulcer [facility])    Past Surgical History:  Procedure Laterality Date  . CARDIAC CATHETERIZATION    . Cataract Surgery Right 02/13/14  . COLON SURGERY  2006-2008-2011   polyps removed  . cystocopy  2003  . HEMORRHOID SURGERY    . PROSTATE SURGERY    . sleep study     Family History  Problem Relation Age of Onset  . Heart disease Father   . Kidney disease Father    Social History   Social History  . Marital status: Married    Spouse name: N/A  . Number of children: N/A  . Years of education: N/A   Social History Main Topics  . Smoking status: Never Smoker  . Smokeless tobacco: Never Used  . Alcohol use No  . Drug use: No  . Sexual activity: No   Other Topics Concern  . None   Social History Narrative  . None    Outpatient Encounter Prescriptions as of 05/27/2016  Medication Sig  . acetaminophen (TYLENOL) 500 MG tablet Take 500 mg by mouth every 6 (six) hours as needed.  Marland Kitchen amLODipine (NORVASC) 5 MG tablet TAKE 1 TABLET BY MOUTH EVERY DAY.  Marland Kitchen aspirin 81 MG tablet Take 81 mg by mouth daily.  Sarajane Marek Sodium 30-100 MG CAPS Take by mouth.  .  fluticasone (FLONASE) 50 MCG/ACT nasal spray TWO PUFFS IN EACH NOSTRIL ONCE A DAY  . hydrochlorothiazide (HYDRODIURIL) 25 MG tablet TAKE 1 TABLET BY MOUTH EVERY DAY.  . hyoscyamine (LEVSIN, ANASPAZ) 0.125 MG tablet Take 0.125 mg by mouth as needed.  . irbesartan (AVAPRO) 300 MG tablet TAKE 1 TABLET BY MOUTH EVERY DAY.  Marland Kitchen L-Lysine 500 MG TABS Take by mouth daily.  Marland Kitchen levothyroxine (SYNTHROID, LEVOTHROID) 88 MCG tablet TAKE 1 TABLET BY MOUTH EVERY DAY.  Marland Kitchen omeprazole (PRILOSEC) 20 MG capsule TAKE ONE CAPSULE TWICE A DAY  . traMADol (ULTRAM) 50 MG tablet TAKE ONE TABLET TWICE DAILY AS NEEDED  . valACYclovir (VALTREX) 1000 MG tablet TAKE 1 TABLET BY MOUTH EVERY DAY.  Marland Kitchen predniSONE (DELTASONE) 10 MG tablet Take 4 tablets x 1 day and then decrease by 1/2 tablet per day until down to zero mg.   No facility-administered encounter medications on file as of 05/27/2016.     Review of Systems  Constitutional: Negative for appetite change and unexpected weight change.  HENT: Positive for congestion, postnasal drip and sinus pressure.   Respiratory: Positive for cough. Negative for chest tightness and shortness of  breath.   Cardiovascular: Negative for chest pain, palpitations and leg swelling.  Gastrointestinal: Negative for diarrhea, nausea and vomiting.  Skin: Negative for color change and rash.  Neurological: Negative for dizziness, light-headedness and headaches.       Objective:    Physical Exam  Constitutional: He appears well-developed and well-nourished. No distress.  HENT:  Mouth/Throat: Oropharynx is clear and moist.  Nares - slightly erythematous turbinates.  No significant tenderness to palpation over the sinuses.  TMs without erythema.    Eyes: Conjunctivae are normal. Right eye exhibits no discharge. Left eye exhibits no discharge.  Neck: Neck supple.  Cardiovascular: Normal rate and regular rhythm.   Pulmonary/Chest: Effort normal and breath sounds normal. No respiratory distress.    Increased cough with expiration/forced expiration.   Lymphadenopathy:    He has no cervical adenopathy.  Skin: No rash noted. No erythema.    BP 130/82 (BP Location: Left Arm, Patient Position: Sitting, Cuff Size: Large)   Pulse 69   Temp 98 F (36.7 C) (Oral)   Wt 245 lb 6 oz (111.3 kg)   SpO2 97%   BMI 37.31 kg/m  Wt Readings from Last 3 Encounters:  05/27/16 245 lb 6 oz (111.3 kg)  05/24/16 244 lb 9.6 oz (110.9 kg)  04/13/16 249 lb 12.8 oz (113.3 kg)     Lab Results  Component Value Date   WBC 8.1 07/15/2015   HGB 15.2 07/15/2015   HCT 45.7 07/15/2015   PLT 237.0 07/15/2015   GLUCOSE 103 (H) 12/22/2015   CHOL 195 12/22/2015   TRIG 169.0 (H) 12/22/2015   HDL 46.50 12/22/2015   LDLDIRECT 100.0 10/23/2014   LDLCALC 114 (H) 12/22/2015   ALT 36 12/22/2015   AST 33 12/22/2015   NA 142 12/22/2015   K 4.2 12/22/2015   CL 103 12/22/2015   CREATININE 1.33 12/22/2015   BUN 20 12/22/2015   CO2 30 12/22/2015   TSH 2.08 06/23/2015   PSA 0.00 (L) 12/22/2015   HGBA1C 5.3 09/06/2012    Dg Chest 2 View  Result Date: 06/04/2015 CLINICAL DATA:  Cough, congestion and wheezing. EXAM: CHEST  2 VIEW COMPARISON:  Report from 11/13/1997 chest radiograph (images not available). FINDINGS: Top-normal heart size. Atherosclerotic aortic arch. Otherwise normal mediastinal contour. No pneumothorax. No pleural effusion. Clear lungs, with no focal lung consolidation and no pulmonary edema. IMPRESSION: No active cardiopulmonary disease. Electronically Signed   By: Ilona Sorrel M.D.   On: 06/04/2015 13:09       Assessment & Plan:   Problem List Items Addressed This Visit    Hypertension    Blood pressure under good control.  Continue same medication regimen.  Follow pressures.  Follow metabolic panel.        URI (upper respiratory infection)    Do not feel abx are warranted.  Symptoms are improving.  Treat with saline nasal spray and steroid nasal spray as directed.  Mucinex/robitussin as  directed.  Prednisone taper as outlined.  Follow. Closely.  Notify me if symptoms persist.         Other Visit Diagnoses   None.      Hector Pheasant, MD

## 2016-05-29 ENCOUNTER — Encounter: Payer: Self-pay | Admitting: Internal Medicine

## 2016-05-29 NOTE — Assessment & Plan Note (Signed)
Blood pressure under good control.  Continue same medication regimen.  Follow pressures.  Follow metabolic panel.   

## 2016-05-29 NOTE — Assessment & Plan Note (Signed)
Do not feel abx are warranted.  Symptoms are improving.  Treat with saline nasal spray and steroid nasal spray as directed.  Mucinex/robitussin as directed.  Prednisone taper as outlined.  Follow. Closely.  Notify me if symptoms persist.

## 2016-06-03 ENCOUNTER — Other Ambulatory Visit: Payer: Medicare Other

## 2016-06-08 ENCOUNTER — Other Ambulatory Visit (INDEPENDENT_AMBULATORY_CARE_PROVIDER_SITE_OTHER): Payer: Medicare Other

## 2016-06-08 DIAGNOSIS — E7800 Pure hypercholesterolemia, unspecified: Secondary | ICD-10-CM

## 2016-06-08 DIAGNOSIS — I10 Essential (primary) hypertension: Secondary | ICD-10-CM

## 2016-06-08 LAB — LIPID PANEL
CHOL/HDL RATIO: 3
Cholesterol: 199 mg/dL (ref 0–200)
HDL: 58 mg/dL (ref >39.00)
LDL Cholesterol: 111 mg/dL — ABNORMAL HIGH (ref 0–99)
NONHDL: 140.74
Triglycerides: 147 mg/dL (ref 0.0–149.0)
VLDL: 29.4 mg/dL (ref 0.0–40.0)

## 2016-06-08 LAB — HEPATIC FUNCTION PANEL
ALK PHOS: 45 U/L (ref 39–117)
ALT: 35 U/L (ref 0–53)
AST: 26 U/L (ref 0–37)
Albumin: 4 g/dL (ref 3.5–5.2)
BILIRUBIN DIRECT: 0.2 mg/dL (ref 0.0–0.3)
Total Bilirubin: 0.9 mg/dL (ref 0.2–1.2)
Total Protein: 7.1 g/dL (ref 6.0–8.3)

## 2016-06-08 LAB — BASIC METABOLIC PANEL
BUN: 20 mg/dL (ref 6–23)
CHLORIDE: 102 meq/L (ref 96–112)
CO2: 31 meq/L (ref 19–32)
CREATININE: 1.44 mg/dL (ref 0.40–1.50)
Calcium: 9.7 mg/dL (ref 8.4–10.5)
GFR: 50.3 mL/min — ABNORMAL LOW (ref >60.00)
GLUCOSE: 101 mg/dL — AB (ref 70–99)
Potassium: 3.8 mEq/L (ref 3.5–5.1)
Sodium: 140 mEq/L (ref 135–145)

## 2016-06-09 ENCOUNTER — Other Ambulatory Visit: Payer: Self-pay | Admitting: Internal Medicine

## 2016-06-09 DIAGNOSIS — N289 Disorder of kidney and ureter, unspecified: Secondary | ICD-10-CM

## 2016-06-09 NOTE — Progress Notes (Signed)
Order placed for f/u met b.

## 2016-06-22 ENCOUNTER — Other Ambulatory Visit (INDEPENDENT_AMBULATORY_CARE_PROVIDER_SITE_OTHER): Payer: Medicare Other

## 2016-06-22 DIAGNOSIS — N289 Disorder of kidney and ureter, unspecified: Secondary | ICD-10-CM | POA: Diagnosis not present

## 2016-06-22 LAB — BASIC METABOLIC PANEL
BUN: 17 mg/dL (ref 6–23)
CHLORIDE: 100 meq/L (ref 96–112)
CO2: 33 meq/L — AB (ref 19–32)
CREATININE: 1.37 mg/dL (ref 0.40–1.50)
Calcium: 9.7 mg/dL (ref 8.4–10.5)
GFR: 53.27 mL/min — ABNORMAL LOW (ref >60.00)
Glucose, Bld: 110 mg/dL — ABNORMAL HIGH (ref 70–99)
POTASSIUM: 3.8 meq/L (ref 3.5–5.1)
Sodium: 139 mEq/L (ref 135–145)

## 2016-07-29 ENCOUNTER — Other Ambulatory Visit: Payer: Self-pay | Admitting: Internal Medicine

## 2016-07-29 NOTE — Telephone Encounter (Signed)
Please advise on refill.

## 2016-07-29 NOTE — Telephone Encounter (Signed)
ok'd rx for tramadol #60 with one refill.

## 2016-07-30 NOTE — Telephone Encounter (Signed)
RX faxed

## 2016-08-03 ENCOUNTER — Encounter: Payer: Self-pay | Admitting: *Deleted

## 2016-08-04 ENCOUNTER — Ambulatory Visit
Admission: RE | Admit: 2016-08-04 | Discharge: 2016-08-04 | Disposition: A | Discharge status: Home / Self Care | Payer: Medicare Other | Source: Ambulatory Visit | Attending: Unknown Physician Specialty | Admitting: Unknown Physician Specialty

## 2016-08-04 ENCOUNTER — Ambulatory Visit: Payer: Medicare Other | Admitting: Anesthesiology

## 2016-08-04 ENCOUNTER — Encounter
Admission: RE | Disposition: A | Discharge status: Home / Self Care | Payer: Self-pay | Source: Ambulatory Visit | Attending: Unknown Physician Specialty

## 2016-08-04 DIAGNOSIS — K2940 Chronic atrophic gastritis without bleeding: Secondary | ICD-10-CM | POA: Diagnosis not present

## 2016-08-04 DIAGNOSIS — K2970 Gastritis, unspecified, without bleeding: Secondary | ICD-10-CM | POA: Insufficient documentation

## 2016-08-04 DIAGNOSIS — G4730 Sleep apnea, unspecified: Secondary | ICD-10-CM | POA: Diagnosis not present

## 2016-08-04 DIAGNOSIS — K219 Gastro-esophageal reflux disease without esophagitis: Secondary | ICD-10-CM | POA: Insufficient documentation

## 2016-08-04 DIAGNOSIS — I10 Essential (primary) hypertension: Secondary | ICD-10-CM | POA: Diagnosis not present

## 2016-08-04 DIAGNOSIS — E785 Hyperlipidemia, unspecified: Secondary | ICD-10-CM | POA: Insufficient documentation

## 2016-08-04 DIAGNOSIS — Z7982 Long term (current) use of aspirin: Secondary | ICD-10-CM | POA: Diagnosis not present

## 2016-08-04 DIAGNOSIS — Z79899 Other long term (current) drug therapy: Secondary | ICD-10-CM | POA: Insufficient documentation

## 2016-08-04 DIAGNOSIS — D649 Anemia, unspecified: Secondary | ICD-10-CM | POA: Diagnosis not present

## 2016-08-04 DIAGNOSIS — K3189 Other diseases of stomach and duodenum: Secondary | ICD-10-CM | POA: Insufficient documentation

## 2016-08-04 DIAGNOSIS — M1990 Unspecified osteoarthritis, unspecified site: Secondary | ICD-10-CM | POA: Diagnosis not present

## 2016-08-04 DIAGNOSIS — Z85820 Personal history of malignant melanoma of skin: Secondary | ICD-10-CM | POA: Insufficient documentation

## 2016-08-04 DIAGNOSIS — K2270 Barrett's esophagus without dysplasia: Secondary | ICD-10-CM | POA: Insufficient documentation

## 2016-08-04 DIAGNOSIS — E039 Hypothyroidism, unspecified: Secondary | ICD-10-CM | POA: Insufficient documentation

## 2016-08-04 DIAGNOSIS — K2960 Other gastritis without bleeding: Secondary | ICD-10-CM | POA: Diagnosis not present

## 2016-08-04 HISTORY — PX: ESOPHAGOGASTRODUODENOSCOPY (EGD) WITH PROPOFOL: SHX5813

## 2016-08-04 HISTORY — DX: Cardiac arrhythmia, unspecified: I49.9

## 2016-08-04 HISTORY — DX: Hypothyroidism, unspecified: E03.9

## 2016-08-04 SURGERY — ESOPHAGOGASTRODUODENOSCOPY (EGD) WITH PROPOFOL
Anesthesia: General

## 2016-08-04 MED ORDER — PROPOFOL 10 MG/ML IV BOLUS
INTRAVENOUS | Status: DC | PRN
  Administered 2016-08-04 (×2): 50 mg via INTRAVENOUS

## 2016-08-04 MED ORDER — MIDAZOLAM HCL 2 MG/2ML IJ SOLN
INTRAMUSCULAR | Status: AC
  Filled 2016-08-04: qty 2

## 2016-08-04 MED ORDER — LIDOCAINE 2% (20 MG/ML) 5 ML SYRINGE
INTRAMUSCULAR | Status: AC
  Filled 2016-08-04: qty 5

## 2016-08-04 MED ORDER — MIDAZOLAM HCL 2 MG/2ML IJ SOLN
INTRAMUSCULAR | Status: DC | PRN
  Administered 2016-08-04: 1 mg via INTRAVENOUS

## 2016-08-04 MED ORDER — PROPOFOL 10 MG/ML IV BOLUS
INTRAVENOUS | Status: AC
  Filled 2016-08-04: qty 20

## 2016-08-04 MED ORDER — LIDOCAINE HCL (CARDIAC) 20 MG/ML IV SOLN
INTRAVENOUS | Status: DC | PRN
  Administered 2016-08-04: 80 mg via INTRAVENOUS

## 2016-08-04 MED ORDER — SODIUM CHLORIDE 0.9 % IV SOLN
INTRAVENOUS | Status: DC
  Administered 2016-08-04: 11:00:00 via INTRAVENOUS

## 2016-08-04 NOTE — H&P (Signed)
Primary Care Physician:  Einar Pheasant, MD Primary Gastroenterologist:  Dr. Vira Agar  Pre-Procedure History & Physical: HPI:  Hector [name] is a 82 y.o. male is here for an endoscopy for follow up Barretts esophagus   Past Medical History:  Diagnosis Date  . Anemia   . Arthritis   . Cancer ([place])    prostate,skin  . Chicken pox   . Diverticulitis   . Dysrhythmia   . GERD (gastroesophageal reflux disease)   . Hyperlipidemia   . Hypertension   . Hypothyroidism   . Melanoma ([place])    Malignant resection  . Sleep apnea   . Ulcer [facility])     Past Surgical History:  Procedure Laterality Date  . CARDIAC CATHETERIZATION    . Cataract Surgery Right 02/13/14  . COLON SURGERY  2006-2008-2011   polyps removed  . cystocopy  2003  . HEMORRHOID SURGERY    . PROSTATE SURGERY    . sleep study      Prior to Admission medications   Medication Sig Start Date End Date Taking? Authorizing Provider  acetaminophen (TYLENOL) 500 MG tablet Take 500 mg by mouth every 6 (six) hours as needed.   Yes Historical Provider, MD  amLODipine (NORVASC) 5 MG tablet TAKE 1 TABLET BY MOUTH EVERY DAY. 11/03/15  Yes Einar Pheasant, MD  aspirin 81 MG tablet Take 81 mg by mouth daily.   Yes Historical Provider, MD  Casanthranol-Docusate Sodium 30-100 MG CAPS Take by mouth.   Yes Historical Provider, MD  fluticasone (FLONASE) 50 MCG/ACT nasal spray TWO PUFFS IN EACH NOSTRIL ONCE A DAY 06/04/15  Yes Einar Pheasant, MD  hydrochlorothiazide (HYDRODIURIL) 25 MG tablet TAKE 1 TABLET BY MOUTH EVERY DAY. 11/03/15  Yes Einar Pheasant, MD  hyoscyamine (LEVSIN, ANASPAZ) 0.125 MG tablet Take 0.125 mg by mouth as needed.   Yes Historical Provider, MD  irbesartan (AVAPRO) 300 MG tablet TAKE 1 TABLET BY MOUTH EVERY DAY. 10/02/15  Yes Einar Pheasant, MD  L-Lysine 500 MG TABS Take by mouth daily.   Yes Historical Provider, MD  levothyroxine (SYNTHROID, LEVOTHROID) 88 MCG tablet TAKE 1 TABLET BY MOUTH EVERY DAY. 10/02/15  Yes Einar Pheasant, MD  omeprazole (PRILOSEC) 20 MG capsule TAKE ONE CAPSULE TWICE A DAY 11/03/15  Yes Einar Pheasant, MD  valACYclovir (VALTREX) 1000 MG tablet TAKE 1 TABLET BY MOUTH EVERY DAY. 10/02/15  Yes Einar Pheasant, MD  predniSONE (DELTASONE) 10 MG tablet Take 4 tablets x 1 day and then decrease by 1/2 tablet per day until down to zero mg. Patient not taking: Reported on 08/04/2016 05/27/16   Einar Pheasant, MD  traMADol (ULTRAM) 50 MG tablet TAKE ONE TABLET TWICE DAILY AS NEEDED Patient not taking: Reported on 08/04/2016 07/29/16   Einar Pheasant, MD    Allergies as of 07/22/2016 - Review Complete 05/29/2016  Allergen Reaction Noted  . Tape Other (See Comments) 09/28/2012    Family History  Problem Relation Age of Onset  . Heart disease Father   . Kidney disease Father     Social History   Social History  . Marital status: Married    Spouse name: N/A  . Number of children: N/A  . Years of education: N/A   Occupational History  . Not on file.   Social History Main Topics  . Smoking status: Never Smoker  . Smokeless tobacco: Never Used  . Alcohol use No  . Drug use: No  . Sexual activity: No   Other Topics Concern  .  Not on file   Social History Narrative  . No narrative on file    Review of Systems: See HPI, otherwise negative ROS  Physical Exam: BP (!) 133/98   Pulse 78   Temp 97.7 F (36.5 C) (Tympanic)   Resp 20   Ht 5\' 8"  (1.727 m)   Wt 111.1 kg (245 lb)   SpO2 100%   BMI 37.25 kg/m  General:   Alert,  pleasant and cooperative in NAD Head:  Normocephalic and atraumatic. Neck:  Supple; no masses or thyromegaly. Lungs:  Clear throughout to auscultation.    Heart:  Regular rate and rhythm. Abdomen:  Soft, nontender and nondistended. Normal bowel sounds, without guarding, and without rebound.   Neurologic:  Alert and  oriented x4;  grossly normal neurologically.  Impression/Plan: Hector [name] is here for an endoscopy to be performed for follow up Barretts  esophagus  Risks, benefits, limitations, and alternatives regarding  endoscopy have been reviewed with the patient.  Questions have been answered.  All parties agreeable.   Gaylyn Cheers, MD  08/04/2016, 11:24 AM

## 2016-08-04 NOTE — Transfer of Care (Signed)
Immediate Anesthesia Transfer of Care Note  Patient: Hector [name]  Procedure(s) Performed: Procedure(s): ESOPHAGOGASTRODUODENOSCOPY (EGD) WITH PROPOFOL (N/A)  Patient Location: Endoscopy Unit  Anesthesia Type:General  Level of Consciousness: awake, oriented and patient cooperative  Airway & Oxygen Therapy: Patient Spontanous Breathing and Patient connected to nasal cannula oxygen  Post-op Assessment: Report given to RN, Post -op Vital signs reviewed and stable and Patient moving all extremities X 4  Post vital signs: Reviewed and stable  Last Vitals:  Vitals:   08/04/16 1057  BP: (!) 133/98  Pulse: 78  Resp: 20  Temp: 36.5 C    Last Pain:  Vitals:   08/04/16 1057  TempSrc: Tympanic         Complications: No apparent anesthesia complications

## 2016-08-04 NOTE — Anesthesia Postprocedure Evaluation (Signed)
Anesthesia Post Note  Patient: Hector [name]  Procedure(s) Performed: Procedure(s) (LRB): ESOPHAGOGASTRODUODENOSCOPY (EGD) WITH PROPOFOL (N/A)  Patient location during evaluation: Endoscopy Anesthesia Type: General Level of consciousness: awake and alert Pain management: pain level controlled Vital Signs Assessment: post-procedure vital signs reviewed and stable Respiratory status: spontaneous breathing, nonlabored ventilation, respiratory function stable and patient connected to nasal cannula oxygen Cardiovascular status: blood pressure returned to baseline and stable Postop Assessment: no signs of nausea or vomiting Anesthetic complications: no     Last Vitals:  Vitals:   08/04/16 1205 08/04/16 1215  BP: (!) 143/91 (!) 173/99  Pulse: 72 (!) 59  Resp: 15 20  Temp:      Last Pain:  Vitals:   08/04/16 1145  TempSrc: Tympanic                 Precious Haws Piscitello

## 2016-08-04 NOTE — Op Note (Signed)
[facility] Gastroenterology Patient Name: Hector [name] Procedure Date: 08/04/2016 11:16 AM MRN: [id number] Account #: [id number] Date of Birth: [birth year] Admit Type: Outpatient Age: 82 Room: [facility] ENDO ROOM 4 Gender: Male Note Status: Finalized Procedure:            Upper GI endoscopy Indications:          Follow-up of Barrett's esophagus Providers:            Manya Silvas, MD Referring MD:         Einar Pheasant, MD (Referring MD) Medicines:            Propofol per Anesthesia Complications:        No immediate complications. Procedure:            Pre-Anesthesia Assessment:                       - After reviewing the risks and benefits, the patient                        was deemed in satisfactory condition to undergo the                        procedure.                       After obtaining informed consent, the endoscope was                        passed under direct vision. Throughout the procedure,                        the patient's blood pressure, pulse, and oxygen                        saturations were monitored continuously. The Endoscope                        was introduced through the mouth, and advanced to the                        second part of duodenum. The upper GI endoscopy was                        accomplished without difficulty. The patient tolerated                        the procedure well. Findings:      There were esophageal mucosal changes secondary to established       short-segment Barrett's disease present in the lower third of the       esophagus. The maximum longitudinal extent of these mucosal changes was       3 cm in length. Mucosa was biopsied with a cold forceps for histology.       One specimen bottle was sent to pathology.      Scattered mild inflammation characterized by erythema and granularity       was found in the gastric antrum. Biopsies were taken with a cold forceps       for histology. Biopsies were taken  with a cold forceps for Helicobacter       pylori testing.  The examined duodenum was normal. Impression:           - Esophageal mucosal changes secondary to established                        short-segment Barrett's disease. Biopsied.                       - Gastritis. Biopsied.                       - Normal examined duodenum. Recommendation:       - Await pathology results. Manya Silvas, MD 08/04/2016 11:43:52 AM This report has been signed electronically. Number of Addenda: 0 Note Initiated On: 08/04/2016 11:16 AM      [facility]

## 2016-08-04 NOTE — Anesthesia Preprocedure Evaluation (Signed)
Anesthesia Evaluation  Patient identified by MRN, date of birth, ID band Patient awake    Reviewed: Allergy & Precautions, H&P , NPO status , Patient's Chart, lab work & pertinent test results  History of Anesthesia Complications Negative for: history of anesthetic complications  Airway Mallampati: III  TM Distance: <3 FB Neck ROM: limited    Dental  (+) Poor Dentition, Chipped, Upper Dentures   Pulmonary neg shortness of breath, sleep apnea ,    Pulmonary exam normal breath sounds clear to auscultation       Cardiovascular Exercise Tolerance: Good hypertension, (-) angina(-) Past MI and (-) DOE Normal cardiovascular exam+ dysrhythmias Atrial Fibrillation  Rhythm:regular Rate:Normal     Neuro/Psych negative neurological ROS  negative psych ROS   GI/Hepatic Neg liver ROS, GERD  Controlled,  Endo/Other  Hypothyroidism   Renal/GU negative Renal ROS  negative genitourinary   Musculoskeletal  (+) Arthritis ,   Abdominal   Peds  Hematology negative hematology ROS (+)   Anesthesia Other Findings Past Medical History: No date: Anemia No date: Arthritis No date: Cancer ([facility])     Comment: prostate,skin No date: Chicken pox No date: Diverticulitis No date: Dysrhythmia No date: GERD (gastroesophageal reflux disease) No date: Hyperlipidemia No date: Hypertension No date: Hypothyroidism No date: Melanoma ([place])     Comment: Malignant resection No date: Sleep apnea No date: Ulcer [facility])  Past Surgical History: No date: CARDIAC CATHETERIZATION 02/13/14: Cataract Surgery Right 2006-2008-2011: COLON SURGERY     Comment: polyps removed 2003: cystocopy No date: HEMORRHOID SURGERY No date: PROSTATE SURGERY No date: sleep study  BMI    Body Mass Index:  37.25 kg/m      Reproductive/Obstetrics negative OB ROS                             Anesthesia Physical Anesthesia Plan  ASA:  III  Anesthesia Plan: General   Post-op Pain Management:    Induction:   Airway Management Planned:   Additional Equipment:   Intra-op Plan:   Post-operative Plan:   Informed Consent: I have reviewed the patients History and Physical, chart, labs and discussed the procedure including the risks, benefits and alternatives for the proposed anesthesia with the patient or authorized representative who has indicated his/her understanding and acceptance.   Dental Advisory Given  Plan Discussed with: Anesthesiologist, CRNA and Surgeon  Anesthesia Plan Comments:         Anesthesia Quick Evaluation

## 2016-08-05 ENCOUNTER — Encounter: Payer: Self-pay | Admitting: Unknown Physician Specialty

## 2016-08-06 LAB — SURGICAL PATHOLOGY

## 2016-08-24 ENCOUNTER — Ambulatory Visit (INDEPENDENT_AMBULATORY_CARE_PROVIDER_SITE_OTHER): Payer: Medicare Other | Admitting: Internal Medicine

## 2016-08-24 ENCOUNTER — Encounter: Payer: Self-pay | Admitting: Internal Medicine

## 2016-08-24 DIAGNOSIS — C439 Malignant melanoma of skin, unspecified: Secondary | ICD-10-CM

## 2016-08-24 DIAGNOSIS — E669 Obesity, unspecified: Secondary | ICD-10-CM

## 2016-08-24 DIAGNOSIS — C61 Malignant neoplasm of prostate: Secondary | ICD-10-CM | POA: Diagnosis not present

## 2016-08-24 DIAGNOSIS — E7800 Pure hypercholesterolemia, unspecified: Secondary | ICD-10-CM

## 2016-08-24 DIAGNOSIS — K2270 Barrett's esophagus without dysplasia: Secondary | ICD-10-CM

## 2016-08-24 DIAGNOSIS — M25512 Pain in left shoulder: Secondary | ICD-10-CM

## 2016-08-24 DIAGNOSIS — I10 Essential (primary) hypertension: Secondary | ICD-10-CM | POA: Diagnosis not present

## 2016-08-24 DIAGNOSIS — M25552 Pain in left hip: Secondary | ICD-10-CM

## 2016-08-24 MED ORDER — TRAMADOL HCL 50 MG PO TABS
ORAL_TABLET | ORAL | 1 refills | Status: DC
Start: 2016-08-24 — End: 2016-12-27

## 2016-08-24 NOTE — Progress Notes (Signed)
Pre-visit discussion using our clinic review tool. No additional management support is needed unless otherwise documented below in the visit note.  

## 2016-08-24 NOTE — Progress Notes (Signed)
[name], male   DOB: 10/28/1934, 82 y.o.   MRN: [id number]   Subjective:    [name], male    DOB: 23-Sep-1934, 82 y.o.   MRN: [id number]  HPI  Patient here for a scheduled follow up.  He reports he has been having left hip pain for the last 4-5 weeks.  Hurts if stands for long period of time or when shopping.  Also some pain in his left shoulder.  Taking tramadol prn.  Discussed taking on a regular basis.  No chest pain.  No sob.  Blood pressure has been stable.  Pulse averaging in the 50s.  On one occasion - 39.  Discussed further cardiac w/up.  He declines.  No syncope.  No near syncope.  No dizziness or light headedness.     Past Medical History:  Diagnosis Date  . Anemia   . Arthritis   . Cancer ([place])    prostate,skin  . Chicken pox   . Diverticulitis   . Dysrhythmia   . GERD (gastroesophageal reflux disease)   . Hyperlipidemia   . Hypertension   . Hypothyroidism   . Melanoma ([place])    Malignant resection  . Sleep apnea   . Ulcer [facility])    Past Surgical History:  Procedure Laterality Date  . CARDIAC CATHETERIZATION    . Cataract Surgery Right 02/13/14  . COLON SURGERY  2006-2008-2011   polyps removed  . cystocopy  2003  . ESOPHAGOGASTRODUODENOSCOPY (EGD) WITH PROPOFOL N/A 08/04/2016   Procedure: ESOPHAGOGASTRODUODENOSCOPY (EGD) WITH PROPOFOL;  Surgeon: Manya Silvas, MD;  Location: [facility] ENDOSCOPY;  Service: Endoscopy;  Laterality: N/A;  . HEMORRHOID SURGERY    . PROSTATE SURGERY    . sleep study     Family History  Problem Relation Age of Onset  . Heart disease Father   . Kidney disease Father    Social History   Social History  . Marital status: Married    Spouse name: N/A  . Number of children: N/A  . Years of education: N/A   Social History Main Topics  . Smoking status: Never Smoker  . Smokeless tobacco: Never Used  . Alcohol use No  . Drug use: No  . Sexual activity: No   Other Topics Concern  . None   Social  History Narrative  . None    Outpatient Encounter Prescriptions as of 08/24/2016  Medication Sig  . acetaminophen (TYLENOL) 500 MG tablet Take 500 mg by mouth every 6 (six) hours as needed.  Marland Kitchen amLODipine (NORVASC) 5 MG tablet TAKE 1 TABLET BY MOUTH EVERY DAY.  Marland Kitchen aspirin 81 MG tablet Take 81 mg by mouth daily.  Sarajane Marek Sodium 30-100 MG CAPS Take by mouth.  . fluticasone (FLONASE) 50 MCG/ACT nasal spray TWO PUFFS IN EACH NOSTRIL ONCE A DAY  . hydrochlorothiazide (HYDRODIURIL) 25 MG tablet TAKE 1 TABLET BY MOUTH EVERY DAY.  . hyoscyamine (LEVSIN, ANASPAZ) 0.125 MG tablet Take 0.125 mg by mouth as needed.  . irbesartan (AVAPRO) 300 MG tablet TAKE 1 TABLET BY MOUTH EVERY DAY.  Marland Kitchen L-Lysine 500 MG TABS Take by mouth daily.  Marland Kitchen levothyroxine (SYNTHROID, LEVOTHROID) 88 MCG tablet TAKE 1 TABLET BY MOUTH EVERY DAY.  Marland Kitchen omeprazole (PRILOSEC) 20 MG capsule TAKE ONE CAPSULE TWICE A DAY  . predniSONE (DELTASONE) 10 MG tablet Take 4 tablets x 1 day and then decrease by 1/2 tablet per day until down to zero mg.  . traMADol (ULTRAM) 50  MG tablet TAKE ONE TABLET TWICE DAILY AS NEEDED  . valACYclovir (VALTREX) 1000 MG tablet TAKE 1 TABLET BY MOUTH EVERY DAY.  . [DISCONTINUED] traMADol (ULTRAM) 50 MG tablet TAKE ONE TABLET TWICE DAILY AS NEEDED   No facility-administered encounter medications on file as of 08/24/2016.     Review of Systems  Constitutional: Negative for appetite change and unexpected weight change.  HENT: Negative for congestion and sinus pressure.   Respiratory: Negative for cough, chest tightness and shortness of breath.   Cardiovascular: Negative for chest pain, palpitations and leg swelling.  Gastrointestinal: Negative for diarrhea, nausea and vomiting.       No increased abdominal pain.    Genitourinary: Negative for difficulty urinating and dysuria.  Musculoskeletal:       Left hip pain and left shoulder pain as outlined.    Skin: Negative for color change and rash.    Neurological: Negative for dizziness, light-headedness and headaches.  Psychiatric/Behavioral: Negative for agitation and dysphoric mood.       Objective:    Physical Exam  Constitutional: He appears well-developed and well-nourished. No distress.  HENT:  Nose: Nose normal.  Mouth/Throat: Oropharynx is clear and moist.  Neck: Neck supple. No thyromegaly present.  Cardiovascular: Normal rate and regular rhythm.   Pulmonary/Chest: Effort normal and breath sounds normal. No respiratory distress.  Abdominal: Soft. Bowel sounds are normal. There is no tenderness.  Musculoskeletal: He exhibits no edema or tenderness.  Lymphadenopathy:    He has no cervical adenopathy.  Skin: No rash noted. No erythema.  Psychiatric: He has a normal mood and affect. His behavior is normal.    BP 130/60   Pulse (!) 58   Temp 97.7 F (36.5 C) (Oral)   Resp 16   Ht 5\' 8"  (1.727 m)   Wt 246 lb 6 oz (111.8 kg)   SpO2 93%   BMI 37.46 kg/m  Wt Readings from Last 3 Encounters:  08/24/16 246 lb 6 oz (111.8 kg)  08/04/16 245 lb (111.1 kg)  05/27/16 245 lb 6 oz (111.3 kg)     Lab Results  Component Value Date   WBC 8.1 07/15/2015   HGB 15.2 07/15/2015   HCT 45.7 07/15/2015   PLT 237.0 07/15/2015   GLUCOSE 110 (H) 06/22/2016   CHOL 199 06/08/2016   TRIG 147.0 06/08/2016   HDL 58.00 06/08/2016   LDLDIRECT 100.0 10/23/2014   LDLCALC 111 (H) 06/08/2016   ALT 35 06/08/2016   AST 26 06/08/2016   NA 139 06/22/2016   K 3.8 06/22/2016   CL 100 06/22/2016   CREATININE 1.37 06/22/2016   BUN 17 06/22/2016   CO2 33 (H) 06/22/2016   TSH 2.08 06/23/2015   PSA 0.00 (L) 12/22/2015   HGBA1C 5.3 09/06/2012       Assessment & Plan:   Problem List Items Addressed This Visit    Barrett's esophagus    Just had EGD 08/04/16 - followed by GI.        Hypercholesterolemia    Low cholesterol diet and exercise.  Follow lipid panel.        Hypertension    Blood pressure as outlined.  A little elevated  on my check.  Follow pressures.  Follow metabolic panel.        Left hip pain    Discussed further w/up.  He declines.  Discussed xray.  Declines.  Wants to try tramadol scheduled.  Tylenol.  Follow.        Malignant melanoma (  [place])    Followed by dermatology.        Obesity (BMI 30-39.9)    Diet and exercise.  Follow.       Prostate cancer [facility])    History of prostate cancer s/p prostatectomy.  Follow psa.        Shoulder pain, left    Discussed further w/up including xray.  He declines.  Take tramadol scheduled.  Tylenol.  Follow.            Einar Pheasant, MD

## 2016-08-29 ENCOUNTER — Encounter: Payer: Self-pay | Admitting: Internal Medicine

## 2016-08-29 DIAGNOSIS — G8929 Other chronic pain: Secondary | ICD-10-CM | POA: Insufficient documentation

## 2016-08-29 DIAGNOSIS — M25552 Pain in left hip: Secondary | ICD-10-CM

## 2016-08-29 DIAGNOSIS — M25512 Pain in left shoulder: Secondary | ICD-10-CM | POA: Insufficient documentation

## 2016-08-29 NOTE — Assessment & Plan Note (Signed)
Discussed further w/up including xray.  He declines.  Take tramadol scheduled.  Tylenol.  Follow.

## 2016-08-29 NOTE — Assessment & Plan Note (Signed)
Diet and exercise.  Follow.  

## 2016-08-29 NOTE — Assessment & Plan Note (Signed)
Blood pressure as outlined.  A little elevated on my check.  Follow pressures.  Follow metabolic panel.

## 2016-08-29 NOTE — Assessment & Plan Note (Signed)
History of prostate cancer.  s/p prostatectomy.  Follow psa.   

## 2016-08-29 NOTE — Assessment & Plan Note (Signed)
Discussed further w/up.  He declines.  Discussed xray.  Declines.  Wants to try tramadol scheduled.  Tylenol.  Follow.

## 2016-08-29 NOTE — Assessment & Plan Note (Signed)
Followed by dermatology

## 2016-08-29 NOTE — Assessment & Plan Note (Signed)
Just had EGD 08/04/16 - followed by GI.

## 2016-08-29 NOTE — Assessment & Plan Note (Signed)
Low cholesterol diet and exercise.  Follow lipid panel.   

## 2016-08-30 ENCOUNTER — Other Ambulatory Visit: Payer: Self-pay | Admitting: Internal Medicine

## 2016-08-30 NOTE — Telephone Encounter (Signed)
ok'd rx for valtrex #30 with 2 refills.

## 2016-08-30 NOTE — Telephone Encounter (Signed)
Last OV 05/27/16 last filled 10/02/15 30 5rf

## 2016-09-02 ENCOUNTER — Telehealth: Payer: Self-pay | Admitting: Internal Medicine

## 2016-09-02 NOTE — Telephone Encounter (Signed)
If increased pain and not able to walk, this is a change from when he was in previously.  Needs to go ahead and be evaluated to determine etiology.  If no openings here, then acute care today and then we can f/u with him.

## 2016-09-02 NOTE — Telephone Encounter (Signed)
Pt daughter called about pt being in a lot of pain and cannot walk or stand. Pt is walking not far in a great deal of pain. Pt daughter states that Dr Nicki Reaper told him to call if he was not better. No appt avail to sch.   Call daughter @ [phone]. Thank you!

## 2016-09-02 NOTE — Telephone Encounter (Signed)
Patient is able to walk around but it is very difficultdifficult, needs to be seen. Could we schedule him in today or sometime soon

## 2016-09-02 NOTE — Telephone Encounter (Signed)
Called daughter pain is increased he is not able to walk far at all. Pain is now going into leg she was not sure witch one and he is not with her. Patient is taking the tramadol with not much help. She also wanted to let you know that he had two falls that started around the hip pain.

## 2016-09-02 NOTE — Telephone Encounter (Signed)
Spoke to daughter she will advise patient to go to an urgent care for is pain. Will f/u with [place] on his next appt

## 2016-09-07 ENCOUNTER — Ambulatory Visit (INDEPENDENT_AMBULATORY_CARE_PROVIDER_SITE_OTHER): Payer: Medicare Other

## 2016-09-07 ENCOUNTER — Ambulatory Visit
Admission: EM | Admit: 2016-09-07 | Discharge: 2016-09-07 | Disposition: A | Discharge status: Home / Self Care | Payer: Medicare Other | Attending: Family Medicine | Admitting: Family Medicine

## 2016-09-07 ENCOUNTER — Encounter: Payer: Self-pay | Admitting: *Deleted

## 2016-09-07 DIAGNOSIS — M4316 Spondylolisthesis, lumbar region: Secondary | ICD-10-CM

## 2016-09-07 DIAGNOSIS — M5416 Radiculopathy, lumbar region: Secondary | ICD-10-CM

## 2016-09-07 DIAGNOSIS — M5410 Radiculopathy, site unspecified: Secondary | ICD-10-CM | POA: Diagnosis not present

## 2016-09-07 DIAGNOSIS — M545 Low back pain: Secondary | ICD-10-CM | POA: Diagnosis not present

## 2016-09-07 MED ORDER — TIZANIDINE HCL 4 MG PO CAPS: 4.0000 mg | ORAL_CAPSULE | Freq: Three times a day (TID) | ORAL | 0 refills | Status: DC

## 2016-09-07 MED ORDER — METHYLPREDNISOLONE 4 MG PO TBPK: ORAL_TABLET | ORAL | 0 refills | Status: DC

## 2016-09-07 MED ORDER — HYDROCODONE-ACETAMINOPHEN 5-325 MG PO TABS: 1.0000 | ORAL_TABLET | Freq: Four times a day (QID) | ORAL | 0 refills | Status: DC | PRN

## 2016-09-07 NOTE — ED Provider Notes (Signed)
CSN: [id number]     Arrival date & time 09/07/16  1138 History   First MD Initiated Contact with Patient 09/07/16 1221     Chief Complaint  Patient presents with  . Back Pain   (Consider location/radiation/quality/duration/timing/severity/associated sxs/prior Treatment) HPI  This a 82 year old male who presents with unremitting does sided low back pain with radiation into his left posterior thigh to the great toe. He thinks he initially hurt his back when he was cutting shrubs in November. Back pain that has persisted and actually worsened over the last 2 weeks. Wife who accompanies him states that he has not been sleeping hardly at all for the last 2 weeks because of the constant pain that he has at nighttime. She has a history of melanoma and also of prostatic cancer in the past. He states that the pain is improved by leaning forward all other movements seem to exacerbate it.Denies incontinence       Past Medical History:  Diagnosis Date  . Anemia   . Arthritis   . Cancer ([place])    prostate,skin  . Chicken pox   . Diverticulitis   . Dysrhythmia   . GERD (gastroesophageal reflux disease)   . Hyperlipidemia   . Hypertension   . Hypothyroidism   . Melanoma ([place])    Malignant resection  . Sleep apnea   . Ulcer [facility])    Past Surgical History:  Procedure Laterality Date  . CARDIAC CATHETERIZATION    . Cataract Surgery Right 02/13/14  . COLON SURGERY  2006-2008-2011   polyps removed  . cystocopy  2003  . ESOPHAGOGASTRODUODENOSCOPY (EGD) WITH PROPOFOL N/A 08/04/2016   Procedure: ESOPHAGOGASTRODUODENOSCOPY (EGD) WITH PROPOFOL;  Surgeon: Manya Silvas, MD;  Location: [facility] ENDOSCOPY;  Service: Endoscopy;  Laterality: N/A;  . HEMORRHOID SURGERY    . PROSTATE SURGERY    . sleep study     Family History  Problem Relation Age of Onset  . Liver disease Mother   . Heart disease Father   . Kidney disease Father    Social History  Substance Use Topics  . Smoking status: Never  Smoker  . Smokeless tobacco: Never Used  . Alcohol use No    Review of Systems  Constitutional: Positive for activity change. Negative for chills, fatigue and fever.  Musculoskeletal: Positive for back pain, gait problem and myalgias.  All other systems reviewed and are negative.   Allergies  Tape  Home Medications   Prior to Admission medications   Medication Sig Start Date End Date Taking? Authorizing Provider  acetaminophen (TYLENOL) 500 MG tablet Take 500 mg by mouth every 6 (six) hours as needed.   Yes Historical Provider, MD  amLODipine (NORVASC) 5 MG tablet TAKE 1 TABLET BY MOUTH EVERY DAY. 11/03/15  Yes Einar Pheasant, MD  aspirin 81 MG tablet Take 81 mg by mouth daily.   Yes Historical Provider, MD  Casanthranol-Docusate Sodium 30-100 MG CAPS Take by mouth.   Yes Historical Provider, MD  hydrochlorothiazide (HYDRODIURIL) 25 MG tablet TAKE 1 TABLET BY MOUTH EVERY DAY. 11/03/15  Yes Einar Pheasant, MD  hyoscyamine (LEVSIN, ANASPAZ) 0.125 MG tablet Take 0.125 mg by mouth as needed.   Yes Historical Provider, MD  irbesartan (AVAPRO) 300 MG tablet TAKE 1 TABLET BY MOUTH EVERY DAY. 10/02/15  Yes Einar Pheasant, MD  L-Lysine 500 MG TABS Take by mouth daily.   Yes Historical Provider, MD  levothyroxine (SYNTHROID, LEVOTHROID) 88 MCG tablet TAKE 1 TABLET BY MOUTH EVERY DAY. 10/02/15  Yes Einar Pheasant, MD  omeprazole (PRILOSEC) 20 MG capsule TAKE ONE CAPSULE TWICE A DAY 11/03/15  Yes Einar Pheasant, MD  traMADol (ULTRAM) 50 MG tablet TAKE ONE TABLET TWICE DAILY AS NEEDED 08/24/16  Yes Einar Pheasant, MD  fluticasone (FLONASE) 50 MCG/ACT nasal spray TWO PUFFS IN EACH NOSTRIL ONCE A DAY 06/04/15   Einar Pheasant, MD  HYDROcodone-acetaminophen (NORCO/VICODIN) 5-325 MG tablet Take 1-2 tablets by mouth every 6 (six) hours as needed. 09/07/16   Lorin Picket, PA-C  methylPREDNISolone (MEDROL DOSEPAK) 4 MG TBPK tablet Take as per package instructions 09/07/16   Lorin Picket, PA-C  tiZANidine  (ZANAFLEX) 4 MG capsule Take 1 capsule (4 mg total) by mouth 3 (three) times daily. 09/07/16   Lorin Picket, PA-C  valACYclovir (VALTREX) 1000 MG tablet TAKE 1 TABLET BY MOUTH EVERY DAY. 08/30/16   Einar Pheasant, MD   Meds Ordered and Administered this Visit  Medications - No data to display  BP 132/68 (BP Location: Left Arm)   Pulse 60   Temp 97.7 F (36.5 C) (Oral)   Resp 16   Ht 5\' 8"  (1.727 m)   Wt 246 lb (111.6 kg)   SpO2 99%   BMI 37.40 kg/m  No data found.   Physical Exam  Constitutional: He appears well-developed and well-nourished. No distress.  HENT:  Head: Normocephalic and atraumatic.  Eyes: EOM are normal. Pupils are equal, round, and reactive to light. Right eye exhibits no discharge. Left eye exhibits no discharge.  Neck: Normal range of motion. Neck supple.  Musculoskeletal:  Examination of the lumbar spine shows a level pelvis in stance. The patient likes to assume a slightly forward flexion posture. Patient sensory hip esthesia to touch in an L5 distribution on the left. EHL peroneal and anterior tibialis are strong to clinical testing. Leg raise testing is positive at 80 on the left with radiation of pain into his left great toe. DTR shows the left knee reflex 0 right at 1-2 over 4.  Skin: He is not diaphoretic.  Nursing note and vitals reviewed.   Urgent Care Course     Procedures (including critical care time)  Labs Review Labs Reviewed - No data to display  Imaging Review Dg Lumbar Spine Complete  Result Date: 09/07/2016 CLINICAL DATA:  Low back pain since November 2017. EXAM: LUMBAR SPINE - COMPLETE 4+ VIEW COMPARISON:  CT scan 10/03/2006 FINDINGS: There are bilateral pars defects at L5 with a grade 1 spondylolisthesis and associated degenerative disc disease and facet disease at L5-S1. The other lumbar vertebral bodies are normally aligned. Disc spaces are maintained. Small marginal osteophytes. The visualized bony pelvis is intact. The SI joints  appear normal. IMPRESSION: Bilateral pars defects at L5 with a grade 1 spondylolisthesis and associated degenerative disc disease and facet disease at L5-S1. No acute bony findings. Electronically Signed   By: Marijo Sanes M.D.   On: 09/07/2016 13:52     Visual Acuity Review  Right Eye Distance:   Left Eye Distance:   Bilateral Distance:    Right Eye Near:   Left Eye Near:    Bilateral Near:     Patient was fitted with a lumbar brace    MDM   1. Spondylolisthesis of lumbar region   2. Lumbar radiculopathy, acute    Discharge Medication List as of 09/07/2016  2:20 PM    START taking these medications   Details  HYDROcodone-acetaminophen (NORCO/VICODIN) 5-325 MG tablet Take 1-2 tablets by mouth every  6 (six) hours as needed., Starting Tue 09/07/2016, Print    methylPREDNISolone (MEDROL DOSEPAK) 4 MG TBPK tablet Take as per package instructions, Normal    tiZANidine (ZANAFLEX) 4 MG capsule Take 1 capsule (4 mg total) by mouth 3 (three) times daily., Starting Tue 09/07/2016, Normal      Plan: 1. Test/x-ray results and diagnosis reviewed with patient 2. rx as per orders; risks, benefits, potential side effects reviewed with patient 3. Recommend supportive treatment with Symptom avoidance and rest. Avoid sitting lifting and bending. Recommend he follow-up with his primary care physician later this week or next week. 4. F/u prn if symptoms worsen or don't improve     Lorin Picket, PA-C 09/07/16 2209

## 2016-09-07 NOTE — ED Triage Notes (Signed)
Pt was cutting shrubs last November and felt low left sided back pain that has persisted and worsened over past 2 weeks. Pain was at onset LS spine region, it now extends to left hip and radiates to left knee and foot.

## 2016-09-09 ENCOUNTER — Telehealth: Payer: Self-pay

## 2016-09-09 NOTE — Telephone Encounter (Signed)
Courtesy call back completed today for patient's recent visit at [facility]. Patient did not answer, left message on machine to call back with any questions or concerns.   

## 2016-09-10 ENCOUNTER — Encounter: Payer: Self-pay | Admitting: Internal Medicine

## 2016-09-10 DIAGNOSIS — M545 Low back pain, unspecified: Secondary | ICD-10-CM

## 2016-09-10 DIAGNOSIS — M25552 Pain in left hip: Secondary | ICD-10-CM

## 2016-09-13 NOTE — Telephone Encounter (Signed)
Order placed for referral to Dr Chasnis.   

## 2016-09-15 NOTE — Telephone Encounter (Signed)
Pt is having increased back pain.  Can we get him in asap.  I had requested Dr Sharlet Salina.  If this is going to take a while, they are willing to see anyone in ortho.  Will you let them know.  Thanks.

## 2016-09-17 NOTE — Telephone Encounter (Signed)
Pt's daughter wants to know if he can be referred to a pain management provider until they can be seen by Dr. Sharlet Salina. They are willing to travel to [place]. Please advise.

## 2016-09-18 NOTE — Telephone Encounter (Signed)
Order placed for referral to pain clinic.  

## 2016-09-27 ENCOUNTER — Other Ambulatory Visit: Payer: Self-pay | Admitting: Internal Medicine

## 2016-10-04 DIAGNOSIS — G894 Chronic pain syndrome: Secondary | ICD-10-CM | POA: Diagnosis not present

## 2016-10-04 DIAGNOSIS — M47817 Spondylosis without myelopathy or radiculopathy, lumbosacral region: Secondary | ICD-10-CM | POA: Diagnosis not present

## 2016-10-04 DIAGNOSIS — Z79899 Other long term (current) drug therapy: Secondary | ICD-10-CM | POA: Diagnosis not present

## 2016-10-04 DIAGNOSIS — M4696 Unspecified inflammatory spondylopathy, lumbar region: Secondary | ICD-10-CM | POA: Diagnosis not present

## 2016-10-04 DIAGNOSIS — Z79891 Long term (current) use of opiate analgesic: Secondary | ICD-10-CM | POA: Diagnosis not present

## 2016-10-04 DIAGNOSIS — M5137 Other intervertebral disc degeneration, lumbosacral region: Secondary | ICD-10-CM | POA: Diagnosis not present

## 2016-10-05 ENCOUNTER — Other Ambulatory Visit: Payer: Self-pay | Admitting: Family Medicine

## 2016-10-05 DIAGNOSIS — M545 Low back pain: Secondary | ICD-10-CM

## 2016-10-12 DIAGNOSIS — M47817 Spondylosis without myelopathy or radiculopathy, lumbosacral region: Secondary | ICD-10-CM | POA: Diagnosis not present

## 2016-10-12 DIAGNOSIS — M4696 Unspecified inflammatory spondylopathy, lumbar region: Secondary | ICD-10-CM | POA: Diagnosis not present

## 2016-10-12 DIAGNOSIS — M5137 Other intervertebral disc degeneration, lumbosacral region: Secondary | ICD-10-CM | POA: Diagnosis not present

## 2016-10-15 ENCOUNTER — Encounter: Payer: Self-pay | Admitting: Internal Medicine

## 2016-10-15 NOTE — Telephone Encounter (Signed)
I would prefer them contact the MD that ordered the MRI and see if they want to give something.  If problems, let me know.

## 2016-10-19 ENCOUNTER — Ambulatory Visit
Admission: RE | Admit: 2016-10-19 | Discharge: 2016-10-19 | Disposition: A | Discharge status: Home / Self Care | Payer: Medicare Other | Source: Ambulatory Visit | Attending: Family Medicine | Admitting: Family Medicine

## 2016-10-19 DIAGNOSIS — M545 Low back pain: Secondary | ICD-10-CM | POA: Diagnosis not present

## 2016-10-19 DIAGNOSIS — M5137 Other intervertebral disc degeneration, lumbosacral region: Secondary | ICD-10-CM | POA: Insufficient documentation

## 2016-10-26 DIAGNOSIS — M5417 Radiculopathy, lumbosacral region: Secondary | ICD-10-CM | POA: Diagnosis not present

## 2016-10-28 ENCOUNTER — Ambulatory Visit (INDEPENDENT_AMBULATORY_CARE_PROVIDER_SITE_OTHER): Payer: Medicare Other | Admitting: Internal Medicine

## 2016-10-28 ENCOUNTER — Other Ambulatory Visit: Payer: Self-pay | Admitting: Internal Medicine

## 2016-10-28 ENCOUNTER — Encounter: Payer: Self-pay | Admitting: Internal Medicine

## 2016-10-28 VITALS — BP 120/62 | HR 65 | Temp 97.8°F | Resp 16 | Ht 68.0 in | Wt 235.6 lb

## 2016-10-28 DIAGNOSIS — I10 Essential (primary) hypertension: Secondary | ICD-10-CM

## 2016-10-28 DIAGNOSIS — Z125 Encounter for screening for malignant neoplasm of prostate: Secondary | ICD-10-CM | POA: Diagnosis not present

## 2016-10-28 DIAGNOSIS — E669 Obesity, unspecified: Secondary | ICD-10-CM

## 2016-10-28 DIAGNOSIS — C439 Malignant melanoma of skin, unspecified: Secondary | ICD-10-CM | POA: Diagnosis not present

## 2016-10-28 DIAGNOSIS — E7800 Pure hypercholesterolemia, unspecified: Secondary | ICD-10-CM | POA: Diagnosis not present

## 2016-10-28 DIAGNOSIS — C61 Malignant neoplasm of prostate: Secondary | ICD-10-CM | POA: Diagnosis not present

## 2016-10-28 DIAGNOSIS — M25552 Pain in left hip: Secondary | ICD-10-CM

## 2016-10-28 NOTE — Progress Notes (Signed)
[name], male   DOB: February 01, 1935, 82 y.o.   MRN: [id number]   Subjective:    [name], male    DOB: 22-Sep-1934, 82 y.o.   MRN: [id number]  HPI  Patient here for a scheduled follow up.  Has been having increased back pain and pain extending down his leg.  Had MRI - L-5-S1 anterolisthesis with marked left foraminal encroachment and moderate right foraminal encroachment.  Being seen at pain clinic.  Bilateral injections.  Pain is better.  Taking hydrocodone.  Only taking 1/2 tablet q day.  Discussed taking 1/2 tablet bid since he feels he needs something more.  Not taking with any other pain medications.  Has f/u next week.  Had NCS this week.  Due f/u to discuss.  States he is otherwise doing well. No chest pain.  Breathing stable.  No increased abdominal pain.  Bowels moving.     Past Medical History:  Diagnosis Date  . Anemia   . Arthritis   . Cancer ([place])    prostate,skin  . Chicken pox   . Diverticulitis   . Dysrhythmia   . GERD (gastroesophageal reflux disease)   . Hyperlipidemia   . Hypertension   . Hypothyroidism   . Melanoma ([place])    Malignant resection  . Sleep apnea   . Ulcer [facility])    Past Surgical History:  Procedure Laterality Date  . CARDIAC CATHETERIZATION    . Cataract Surgery Right 02/13/14  . COLON SURGERY  2006-2008-2011   polyps removed  . cystocopy  2003  . ESOPHAGOGASTRODUODENOSCOPY (EGD) WITH PROPOFOL N/A 08/04/2016   Procedure: ESOPHAGOGASTRODUODENOSCOPY (EGD) WITH PROPOFOL;  Surgeon: Manya Silvas, MD;  Location: [facility] ENDOSCOPY;  Service: Endoscopy;  Laterality: N/A;  . HEMORRHOID SURGERY    . PROSTATE SURGERY    . sleep study     Family History  Problem Relation Age of Onset  . Liver disease Mother   . Heart disease Father   . Kidney disease Father    Social History   Social History  . Marital status: Married    Spouse name: N/A  . Number of children: N/A  . Years of education: N/A   Social History Main Topics   . Smoking status: Never Smoker  . Smokeless tobacco: Never Used  . Alcohol use No  . Drug use: No  . Sexual activity: No   Other Topics Concern  . None   Social History Narrative  . None    Outpatient Encounter Prescriptions as of 10/28/2016  Medication Sig  . acetaminophen (TYLENOL) 500 MG tablet Take 500 mg by mouth every 6 (six) hours as needed.  Marland Kitchen aspirin 81 MG tablet Take 81 mg by mouth daily.  Sarajane Marek Sodium 30-100 MG CAPS Take by mouth.  . fluticasone (FLONASE) 50 MCG/ACT nasal spray TWO PUFFS IN EACH NOSTRIL ONCE A DAY  . HYDROcodone-acetaminophen (NORCO/VICODIN) 5-325 MG tablet Take 1-2 tablets by mouth every 6 (six) hours as needed.  . hyoscyamine (LEVSIN, ANASPAZ) 0.125 MG tablet Take 0.125 mg by mouth as needed.  Marland Kitchen L-Lysine 500 MG TABS Take by mouth daily.  Marland Kitchen levothyroxine (SYNTHROID, LEVOTHROID) 88 MCG tablet TAKE 1 TABLET BY MOUTH EVERY DAY.  Marland Kitchen omeprazole (PRILOSEC) 20 MG capsule TAKE ONE CAPSULE TWICE A DAY  . traMADol (ULTRAM) 50 MG tablet TAKE ONE TABLET TWICE DAILY AS NEEDED  . valACYclovir (VALTREX) 1000 MG tablet TAKE 1 TABLET BY MOUTH EVERY DAY.  . [DISCONTINUED] amLODipine (  NORVASC) 5 MG tablet TAKE 1 TABLET BY MOUTH EVERY DAY.  . [DISCONTINUED] hydrochlorothiazide (HYDRODIURIL) 25 MG tablet TAKE 1 TABLET BY MOUTH EVERY DAY.  . [DISCONTINUED] irbesartan (AVAPRO) 300 MG tablet TAKE 1 TABLET BY MOUTH EVERY DAY.  . [DISCONTINUED] methylPREDNISolone (MEDROL DOSEPAK) 4 MG TBPK tablet Take as per package instructions  . [DISCONTINUED] tiZANidine (ZANAFLEX) 4 MG capsule Take 1 capsule (4 mg total) by mouth 3 (three) times daily. (Patient not taking: Reported on 10/28/2016)   No facility-administered encounter medications on file as of 10/28/2016.     Review of Systems  Constitutional: Negative for appetite change and unexpected weight change.  HENT: Negative for congestion and sinus pressure.   Respiratory: Negative for cough, chest tightness and  shortness of breath.   Cardiovascular: Negative for chest pain, palpitations and leg swelling.  Gastrointestinal: Negative for abdominal pain, diarrhea, nausea and vomiting.  Genitourinary: Negative for difficulty urinating and dysuria.  Musculoskeletal: Positive for back pain. Negative for joint swelling.       Pain down leg.    Skin: Negative for color change and rash.  Neurological: Negative for dizziness, light-headedness and headaches.  Psychiatric/Behavioral: Negative for agitation and dysphoric mood.       Objective:    Physical Exam  Constitutional: He appears well-developed and well-nourished. No distress.  HENT:  Nose: Nose normal.  Mouth/Throat: Oropharynx is clear and moist.  Neck: Neck supple. No thyromegaly present.  Cardiovascular: Normal rate and regular rhythm.   Pulmonary/Chest: Effort normal and breath sounds normal. No respiratory distress.  Abdominal: Soft. Bowel sounds are normal. There is no tenderness.  Musculoskeletal: He exhibits no edema or tenderness.  Lymphadenopathy:    He has no cervical adenopathy.  Skin: No rash noted. No erythema.  Psychiatric: He has a normal mood and affect. His behavior is normal.    BP 120/62 (BP Location: Left Arm, Patient Position: Sitting, Cuff Size: Large)   Pulse 65   Temp 97.8 F (36.6 C) (Oral)   Resp 16   Ht 5\' 8"  (1.727 m)   Wt 235 lb 9.6 oz (106.9 kg)   SpO2 97%   BMI 35.82 kg/m  Wt Readings from Last 3 Encounters:  10/28/16 235 lb 9.6 oz (106.9 kg)  09/07/16 246 lb (111.6 kg)  08/24/16 246 lb 6 oz (111.8 kg)     Lab Results  Component Value Date   WBC 8.1 07/15/2015   HGB 15.2 07/15/2015   HCT 45.7 07/15/2015   PLT 237.0 07/15/2015   GLUCOSE 110 (H) 06/22/2016   CHOL 199 06/08/2016   TRIG 147.0 06/08/2016   HDL 58.00 06/08/2016   LDLDIRECT 100.0 10/23/2014   LDLCALC 111 (H) 06/08/2016   ALT 35 06/08/2016   AST 26 06/08/2016   NA 139 06/22/2016   K 3.8 06/22/2016   CL 100 06/22/2016    CREATININE 1.37 06/22/2016   BUN 17 06/22/2016   CO2 33 (H) 06/22/2016   TSH 2.08 06/23/2015   PSA 0.00 (L) 12/22/2015   HGBA1C 5.3 09/06/2012    Mr Lumbar Spine Wo Contrast  Result Date: 10/19/2016 CLINICAL DATA:  Low back pain with left leg pain. History of prostate cancer and melanoma. EXAM: MRI LUMBAR SPINE WITHOUT CONTRAST TECHNIQUE: Multiplanar, multisequence MR imaging of the lumbar spine was performed. No intravenous contrast was administered. COMPARISON:  Lumbar radiographs 09/07/2016. FINDINGS: Segmentation:  Normal.  Lowest disc space L5-S1. Alignment: 6 mm anterolisthesis L5-S1 due to bilateral pars defects of L5. Remaining alignment normal. Vertebrae:  Negative for fracture or mass lesion. Conus medullaris: Extends to the L1 level and appears normal. Paraspinal and other soft tissues: Negative Disc levels: L1-2:  Negative L2-3:  Mild disc degeneration without stenosis L3-4: Mild disc and mild facet degeneration without stenosis. Mild endplate spurring H6-0: Mild disc and facet degeneration without stenosis. Mild endplate spurring V3-X1: Bilateral pars defects of L5 with grade 1 anterolisthesis. Moderate right foraminal encroachment and marked left foraminal encroachment. Compression of the left L5 nerve root. IMPRESSION: Grade 1 anterolisthesis L5-S1 due to bilateral pars defects. Marked left foraminal encroachment and moderate right foraminal encroachment at L5-S1. Electronically Signed   By: Franchot Gallo M.D.   On: 10/19/2016 10:11       Assessment & Plan:   Problem List Items Addressed This Visit    Hypercholesterolemia    Low cholesterol diet and exercise.  Follow lipid panel.        Relevant Orders   Hepatic function panel   Lipid panel   Hypertension - Primary    Blood pressure under good control.  Continue same medication regimen.  Follow pressures.  Follow metabolic panel.        Relevant Orders   CBC with Differential/Platelet   TSH   Basic metabolic panel    Left hip pain    Increased low back, hip and leg pain.  MRI as outlined.  Being followed by pain clinic.  s/p injection.  They are prescribing hydrocodone.  Adjust dose as outlined.  Just had NCS.  f/u planned next week.        Malignant melanoma ([place])    Followed by dermatology.        Obesity (BMI 30-39.9)    Has adjusted his diet.  Lost weight.  Follow.        Prostate cancer [facility])    History of prostate cancer.  s/p prostatectomy.  Follow psa.         Other Visit Diagnoses    Prostate cancer screening       Relevant Orders   PSA, Medicare       Einar Pheasant, MD

## 2016-10-28 NOTE — Progress Notes (Signed)
Pre-visit discussion using our clinic review tool. No additional management support is needed unless otherwise documented below in the visit note.  

## 2016-10-31 ENCOUNTER — Encounter: Payer: Self-pay | Admitting: Internal Medicine

## 2016-10-31 NOTE — Assessment & Plan Note (Signed)
Followed by dermatology

## 2016-10-31 NOTE — Assessment & Plan Note (Signed)
History of prostate cancer.  s/p prostatectomy.  Follow psa.

## 2016-10-31 NOTE — Assessment & Plan Note (Signed)
Low cholesterol diet and exercise.  Follow lipid panel.   

## 2016-10-31 NOTE — Assessment & Plan Note (Signed)
Increased low back, hip and leg pain.  MRI as outlined.  Being followed by pain clinic.  s/p injection.  They are prescribing hydrocodone.  Adjust dose as outlined.  Just had NCS.  f/u planned next week.

## 2016-10-31 NOTE — Assessment & Plan Note (Signed)
Has adjusted his diet.  Lost weight.  Follow.

## 2016-10-31 NOTE — Assessment & Plan Note (Signed)
Blood pressure under good control.  Continue same medication regimen.  Follow pressures.  Follow metabolic panel.   

## 2016-11-03 ENCOUNTER — Other Ambulatory Visit: Payer: Self-pay | Admitting: Internal Medicine

## 2016-11-04 NOTE — Telephone Encounter (Signed)
Patient scheduled for lab 11/25/16 ok to fill til appointment?

## 2016-11-04 NOTE — Telephone Encounter (Signed)
ok'd refill for omeprazole and synthroid.

## 2016-11-15 DIAGNOSIS — M5136 Other intervertebral disc degeneration, lumbar region: Secondary | ICD-10-CM | POA: Diagnosis not present

## 2016-11-15 DIAGNOSIS — M5417 Radiculopathy, lumbosacral region: Secondary | ICD-10-CM | POA: Diagnosis not present

## 2016-11-25 ENCOUNTER — Other Ambulatory Visit (INDEPENDENT_AMBULATORY_CARE_PROVIDER_SITE_OTHER): Payer: Medicare Other

## 2016-11-25 DIAGNOSIS — Z125 Encounter for screening for malignant neoplasm of prostate: Secondary | ICD-10-CM

## 2016-11-25 DIAGNOSIS — E7800 Pure hypercholesterolemia, unspecified: Secondary | ICD-10-CM | POA: Diagnosis not present

## 2016-11-25 DIAGNOSIS — I10 Essential (primary) hypertension: Secondary | ICD-10-CM

## 2016-11-25 LAB — BASIC METABOLIC PANEL
BUN: 15 mg/dL (ref 6–23)
CALCIUM: 10.2 mg/dL (ref 8.4–10.5)
CHLORIDE: 103 meq/L (ref 96–112)
CO2: 34 mEq/L — ABNORMAL HIGH (ref 19–32)
CREATININE: 1.24 mg/dL (ref 0.40–1.50)
GFR: 59.7 mL/min — ABNORMAL LOW (ref >60.00)
Glucose, Bld: 95 mg/dL (ref 70–99)
Potassium: 4.3 mEq/L (ref 3.5–5.1)
Sodium: 143 mEq/L (ref 135–145)

## 2016-11-25 LAB — CBC WITH DIFFERENTIAL/PLATELET
BASOS ABS: 0 10*3/uL (ref 0.0–0.1)
Basophils Relative: 0.5 % (ref 0.0–3.0)
EOS ABS: 0.1 10*3/uL (ref 0.0–0.7)
EOS PCT: 0.7 % (ref 0.0–5.0)
HCT: 46.4 % (ref 39.0–52.0)
HEMOGLOBIN: 15.7 g/dL (ref 13.0–17.0)
LYMPHS ABS: 2.3 10*3/uL (ref 0.7–4.0)
Lymphocytes Relative: 30.9 % (ref 12.0–46.0)
MCHC: 33.9 g/dL (ref 30.0–36.0)
MCV: 94.9 fl (ref 78.0–100.0)
Monocytes Absolute: 0.7 10*3/uL (ref 0.1–1.0)
Monocytes Relative: 9.3 % (ref 3.0–12.0)
Neutro Abs: 4.3 10*3/uL (ref 1.4–7.7)
Neutrophils Relative %: 58.6 % (ref 43.0–77.0)
Platelets: 247 10*3/uL (ref 150.0–400.0)
RBC: 4.89 Mil/uL (ref 4.22–5.81)
RDW: 13.6 % (ref 11.5–15.5)
WBC: 7.4 10*3/uL (ref 4.0–10.5)

## 2016-11-25 LAB — LIPID PANEL
CHOL/HDL RATIO: 3
CHOLESTEROL: 192 mg/dL (ref 0–200)
HDL: 67.3 mg/dL (ref >39.00)
LDL CALC: 105 mg/dL — AB (ref 0–99)
NonHDL: 124.91
Triglycerides: 102 mg/dL (ref 0.0–149.0)
VLDL: 20.4 mg/dL (ref 0.0–40.0)

## 2016-11-25 LAB — HEPATIC FUNCTION PANEL
ALK PHOS: 51 U/L (ref 39–117)
ALT: 35 U/L (ref 0–53)
AST: 19 U/L (ref 0–37)
Albumin: 4.3 g/dL (ref 3.5–5.2)
BILIRUBIN DIRECT: 0.1 mg/dL (ref 0.0–0.3)
BILIRUBIN TOTAL: 0.7 mg/dL (ref 0.2–1.2)
Total Protein: 6.9 g/dL (ref 6.0–8.3)

## 2016-11-25 LAB — TSH: TSH: 2.86 u[IU]/mL (ref 0.35–4.50)

## 2016-11-25 LAB — PSA, MEDICARE: PSA: 0 ng/mL — AB (ref 0.10–4.00)

## 2016-11-26 ENCOUNTER — Encounter: Payer: Self-pay | Admitting: Internal Medicine

## 2016-12-15 DIAGNOSIS — M5136 Other intervertebral disc degeneration, lumbar region: Secondary | ICD-10-CM | POA: Diagnosis not present

## 2016-12-15 DIAGNOSIS — M545 Low back pain: Secondary | ICD-10-CM | POA: Diagnosis not present

## 2016-12-15 DIAGNOSIS — M5417 Radiculopathy, lumbosacral region: Secondary | ICD-10-CM | POA: Diagnosis not present

## 2016-12-15 DIAGNOSIS — M79605 Pain in left leg: Secondary | ICD-10-CM | POA: Diagnosis not present

## 2016-12-27 ENCOUNTER — Other Ambulatory Visit: Payer: Self-pay | Admitting: Internal Medicine

## 2016-12-28 NOTE — Telephone Encounter (Signed)
Faxed to pharmacy

## 2016-12-28 NOTE — Telephone Encounter (Signed)
Kay to refill Tramadol? Last refilled on 08/24/16 for # 60 with 1 refill.   LOV: 10/28/16 NOV: 03/03/17

## 2016-12-28 NOTE — Telephone Encounter (Signed)
ok'd refill for tramadol #60 with one refill.

## 2017-01-18 ENCOUNTER — Telehealth: Payer: Self-pay | Admitting: Internal Medicine

## 2017-01-18 DIAGNOSIS — I10 Essential (primary) hypertension: Secondary | ICD-10-CM

## 2017-01-18 DIAGNOSIS — E7800 Pure hypercholesterolemia, unspecified: Secondary | ICD-10-CM

## 2017-01-18 DIAGNOSIS — R739 Hyperglycemia, unspecified: Secondary | ICD-10-CM

## 2017-01-18 NOTE — Telephone Encounter (Signed)
Do you want to order labs

## 2017-01-18 NOTE — Telephone Encounter (Signed)
Pt is coming on 03/03/2017 for a physical. He wants to know if he should come in a day or two to have labs done.

## 2017-01-19 NOTE — Telephone Encounter (Signed)
Can schedule labs now, orders in thanks

## 2017-01-19 NOTE — Telephone Encounter (Signed)
Orders placed for labs.  Ok to schedule appt.

## 2017-01-20 ENCOUNTER — Telehealth: Payer: Self-pay | Admitting: Internal Medicine

## 2017-01-20 NOTE — Telephone Encounter (Signed)
Called pt to schedule labs. Lm on vm to call office to schedule lab.

## 2017-02-23 ENCOUNTER — Other Ambulatory Visit: Payer: Self-pay | Admitting: Internal Medicine

## 2017-02-24 ENCOUNTER — Other Ambulatory Visit (INDEPENDENT_AMBULATORY_CARE_PROVIDER_SITE_OTHER): Payer: Medicare Other

## 2017-02-24 DIAGNOSIS — R739 Hyperglycemia, unspecified: Secondary | ICD-10-CM

## 2017-02-24 DIAGNOSIS — E7800 Pure hypercholesterolemia, unspecified: Secondary | ICD-10-CM | POA: Diagnosis not present

## 2017-02-24 DIAGNOSIS — I10 Essential (primary) hypertension: Secondary | ICD-10-CM

## 2017-02-24 LAB — HEPATIC FUNCTION PANEL
ALK PHOS: 47 U/L (ref 39–117)
ALT: 26 U/L (ref 0–53)
AST: 21 U/L (ref 0–37)
Albumin: 4.2 g/dL (ref 3.5–5.2)
BILIRUBIN DIRECT: 0.1 mg/dL (ref 0.0–0.3)
BILIRUBIN TOTAL: 0.7 mg/dL (ref 0.2–1.2)
TOTAL PROTEIN: 7.3 g/dL (ref 6.0–8.3)

## 2017-02-24 LAB — BASIC METABOLIC PANEL
BUN: 14 mg/dL (ref 6–23)
CHLORIDE: 102 meq/L (ref 96–112)
CO2: 32 meq/L (ref 19–32)
CREATININE: 1.19 mg/dL (ref 0.40–1.50)
Calcium: 10 mg/dL (ref 8.4–10.5)
GFR: 62.56 mL/min (ref >60.00)
Glucose, Bld: 107 mg/dL — ABNORMAL HIGH (ref 70–99)
POTASSIUM: 3.7 meq/L (ref 3.5–5.1)
SODIUM: 141 meq/L (ref 135–145)

## 2017-02-24 LAB — LIPID PANEL
CHOL/HDL RATIO: 4
Cholesterol: 198 mg/dL (ref 0–200)
HDL: 53 mg/dL (ref >39.00)
NONHDL: 145.41
Triglycerides: 213 mg/dL — ABNORMAL HIGH (ref 0.0–149.0)
VLDL: 42.6 mg/dL — ABNORMAL HIGH (ref 0.0–40.0)

## 2017-02-24 LAB — LDL CHOLESTEROL, DIRECT: Direct LDL: 111 mg/dL

## 2017-02-24 LAB — HEMOGLOBIN A1C: Hgb A1c MFr Bld: 5.2 % (ref 4.6–6.5)

## 2017-02-25 ENCOUNTER — Encounter: Payer: Self-pay | Admitting: Internal Medicine

## 2017-03-03 ENCOUNTER — Ambulatory Visit (INDEPENDENT_AMBULATORY_CARE_PROVIDER_SITE_OTHER): Payer: Medicare Other | Admitting: Internal Medicine

## 2017-03-03 ENCOUNTER — Encounter: Payer: Self-pay | Admitting: Internal Medicine

## 2017-03-03 VITALS — BP 130/80 | HR 62 | Temp 98.6°F | Resp 12 | Ht 68.0 in | Wt 235.6 lb

## 2017-03-03 DIAGNOSIS — K2270 Barrett's esophagus without dysplasia: Secondary | ICD-10-CM

## 2017-03-03 DIAGNOSIS — R739 Hyperglycemia, unspecified: Secondary | ICD-10-CM

## 2017-03-03 DIAGNOSIS — Z0000 Encounter for general adult medical examination without abnormal findings: Secondary | ICD-10-CM | POA: Diagnosis not present

## 2017-03-03 DIAGNOSIS — Z8546 Personal history of malignant neoplasm of prostate: Secondary | ICD-10-CM

## 2017-03-03 DIAGNOSIS — Z6835 Body mass index (BMI) 35.0-35.9, adult: Secondary | ICD-10-CM | POA: Diagnosis not present

## 2017-03-03 DIAGNOSIS — C439 Malignant melanoma of skin, unspecified: Secondary | ICD-10-CM

## 2017-03-03 DIAGNOSIS — E7800 Pure hypercholesterolemia, unspecified: Secondary | ICD-10-CM

## 2017-03-03 DIAGNOSIS — I10 Essential (primary) hypertension: Secondary | ICD-10-CM

## 2017-03-03 NOTE — Assessment & Plan Note (Addendum)
Physical today 03/03/17.  Colonoscopy 11/28/12.  Recommended f/u colonoscopy in 10/2017.  Follow psa.

## 2017-03-03 NOTE — Progress Notes (Signed)
Pre-visit discussion using our clinic review tool. No additional management support is needed unless otherwise documented below in the visit note.  

## 2017-03-03 NOTE — Progress Notes (Signed)
[name], male   DOB: 12-Oct-1934, 82 y.o.   MRN: [id number]   Subjective:    [name], male    DOB: [birth year], 82 y.o.   MRN: [id number]  HPI  Patient here for his physical exam.  States he is doing better.  Was evaluated in [place] by surgery.  Doing better.  Feels better.  Decreased pain.  Trying to stay active.  No chest pain.  No sob.  No acid reflux.  No abdominal pain.  Bowels moving.  Has adjusted his diet.  Has maintained his weight loss.  Discussed his lab results.     Past Medical History:  Diagnosis Date  . Anemia   . Arthritis   . Cancer ([place])    prostate,skin  . Chicken pox   . Diverticulitis   . Dysrhythmia   . GERD (gastroesophageal reflux disease)   . Hyperlipidemia   . Hypertension   . Hypothyroidism   . Melanoma ([place])    Malignant resection  . Sleep apnea   . Ulcer    Past Surgical History:  Procedure Laterality Date  . CARDIAC CATHETERIZATION    . Cataract Surgery Right 02/13/14  . COLON SURGERY  2006-2008-2011   polyps removed  . cystocopy  2003  . ESOPHAGOGASTRODUODENOSCOPY (EGD) WITH PROPOFOL N/A 08/04/2016   Procedure: ESOPHAGOGASTRODUODENOSCOPY (EGD) WITH PROPOFOL;  Surgeon: Manya Silvas, MD;  Location: [facility] ENDOSCOPY;  Service: Endoscopy;  Laterality: N/A;  . HEMORRHOID SURGERY    . PROSTATE SURGERY    . sleep study     Family History  Problem Relation Age of Onset  . Liver disease Mother   . Heart disease Father   . Kidney disease Father    Social History   Social History  . Marital status: Married    Spouse name: N/A  . Number of children: N/A  . Years of education: N/A   Social History Main Topics  . Smoking status: Never Smoker  . Smokeless tobacco: Never Used  . Alcohol use No  . Drug use: No  . Sexual activity: No   Other Topics Concern  . None   Social History Narrative  . None    Outpatient Encounter Prescriptions as of 03/03/2017  Medication Sig  . acetaminophen (TYLENOL) 500 MG tablet  Take 500 mg by mouth every 6 (six) hours as needed.  Marland Kitchen amLODipine (NORVASC) 5 MG tablet TAKE 1 TABLET BY MOUTH EVERY DAY  . aspirin 81 MG tablet Take 81 mg by mouth daily.  Sarajane Marek Sodium 30-100 MG CAPS Take by mouth.  . fluticasone (FLONASE) 50 MCG/ACT nasal spray TWO PUFFS IN EACH NOSTRIL ONCE A DAY  . hydrochlorothiazide (HYDRODIURIL) 25 MG tablet TAKE 1 TABLET BY MOUTH EVERY DAY  . HYDROcodone-acetaminophen (NORCO/VICODIN) 5-325 MG tablet Take 1-2 tablets by mouth every 6 (six) hours as needed.  . hyoscyamine (LEVSIN, ANASPAZ) 0.125 MG tablet Take 0.125 mg by mouth as needed.  . irbesartan (AVAPRO) 300 MG tablet TAKE 1 TABLET BY MOUTH EVERY DAY  . L-Lysine 500 MG TABS Take by mouth daily.  Marland Kitchen levothyroxine (SYNTHROID, LEVOTHROID) 88 MCG tablet TAKE 1 TABLET BY MOUTH EVERY DAY  . omeprazole (PRILOSEC) 20 MG capsule TAKE ONE CAPSULE BY MOUTH TWICE A DAY  . traMADol (ULTRAM) 50 MG tablet TAKE 1 TABLET BY MOUTH TWICE A DAY AS NEEDED.  Marland Kitchen valACYclovir (VALTREX) 1000 MG tablet TAKE 1 TABLET BY MOUTH EVERY DAY.   No facility-administered encounter medications on  file as of 03/03/2017.     Review of Systems  Constitutional: Negative for appetite change and unexpected weight change.  HENT: Negative for congestion and sinus pressure.   Eyes: Negative for pain and visual disturbance.  Respiratory: Negative for cough, chest tightness and shortness of breath.   Cardiovascular: Negative for chest pain, palpitations and leg swelling.  Gastrointestinal: Negative for abdominal pain, diarrhea, nausea and vomiting.  Genitourinary: Negative for difficulty urinating and dysuria.  Musculoskeletal: Negative for joint swelling.       Back pain is better.   Skin: Negative for color change and rash.  Neurological: Negative for dizziness and headaches.  Hematological: Negative for adenopathy. Does not bruise/bleed easily.  Psychiatric/Behavioral: Negative for agitation and dysphoric mood.        Objective:    Physical Exam  Constitutional: He is oriented to person, place, and time. He appears well-developed and well-nourished. No distress.  HENT:  Head: Normocephalic and atraumatic.  Nose: Nose normal.  Mouth/Throat: Oropharynx is clear and moist. No oropharyngeal exudate.  Eyes: Conjunctivae are normal. Right eye exhibits no discharge. Left eye exhibits no discharge.  Neck: Neck supple. No thyromegaly present.  Cardiovascular: Normal rate and regular rhythm.   Pulmonary/Chest: Breath sounds normal. No respiratory distress. He has no wheezes.  Abdominal: Soft. Bowel sounds are normal. There is no tenderness.  Musculoskeletal: He exhibits no edema or tenderness.  Lymphadenopathy:    He has no cervical adenopathy.  Neurological: He is alert and oriented to person, place, and time.  Skin: Skin is warm and dry. No rash noted. No erythema.  Psychiatric: He has a normal mood and affect. His behavior is normal.    BP 130/80 (BP Location: Left Arm, Patient Position: Sitting, Cuff Size: Normal)   Pulse 62   Temp 98.6 F (37 C) (Oral)   Resp 12   Ht 5\' 8"  (1.727 m)   Wt 235 lb 9.6 oz (106.9 kg)   SpO2 95%   BMI 35.82 kg/m  Wt Readings from Last 3 Encounters:  03/03/17 235 lb 9.6 oz (106.9 kg)  10/28/16 235 lb 9.6 oz (106.9 kg)  09/07/16 246 lb (111.6 kg)     Lab Results  Component Value Date   WBC 7.4 11/25/2016   HGB 15.7 11/25/2016   HCT 46.4 11/25/2016   PLT 247.0 11/25/2016   GLUCOSE 107 (H) 02/24/2017   CHOL 198 02/24/2017   TRIG 213.0 (H) 02/24/2017   HDL 53.00 02/24/2017   LDLDIRECT 111.0 02/24/2017   LDLCALC 105 (H) 11/25/2016   ALT 26 02/24/2017   AST 21 02/24/2017   NA 141 02/24/2017   K 3.7 02/24/2017   CL 102 02/24/2017   CREATININE 1.19 02/24/2017   BUN 14 02/24/2017   CO2 32 02/24/2017   TSH 2.86 11/25/2016   PSA 0.00 (L) 11/25/2016   HGBA1C 5.2 02/24/2017    Mr Lumbar Spine Wo Contrast  Result Date: 10/19/2016 CLINICAL DATA:  Low back  pain with left leg pain. History of prostate cancer and melanoma. EXAM: MRI LUMBAR SPINE WITHOUT CONTRAST TECHNIQUE: Multiplanar, multisequence MR imaging of the lumbar spine was performed. No intravenous contrast was administered. COMPARISON:  Lumbar radiographs 09/07/2016. FINDINGS: Segmentation:  Normal.  Lowest disc space L5-S1. Alignment: 6 mm anterolisthesis L5-S1 due to bilateral pars defects of L5. Remaining alignment normal. Vertebrae:  Negative for fracture or mass lesion. Conus medullaris: Extends to the L1 level and appears normal. Paraspinal and other soft tissues: Negative Disc levels: L1-2:  Negative L2-3:  Mild disc degeneration without stenosis L3-4: Mild disc and mild facet degeneration without stenosis. Mild endplate spurring Z4-8: Mild disc and facet degeneration without stenosis. Mild endplate spurring O7-M7: Bilateral pars defects of L5 with grade 1 anterolisthesis. Moderate right foraminal encroachment and marked left foraminal encroachment. Compression of the left L5 nerve root. IMPRESSION: Grade 1 anterolisthesis L5-S1 due to bilateral pars defects. Marked left foraminal encroachment and moderate right foraminal encroachment at L5-S1. Electronically Signed   By: Franchot Gallo M.D.   On: 10/19/2016 10:11       Assessment & Plan:   Problem List Items Addressed This Visit    Barrett's esophagus    S/p EGD 08/04/16 - followed by GI.  No upper symptoms.       BMI 35.0-35.9,adult    He has adjusted his diet.  Maintaining his weight loss.  Discussed diet and exercise.  Follow.       Health care maintenance    Physical today 03/03/17.  Colonoscopy 11/28/12.  Recommended f/u colonoscopy in 10/2017.  Follow psa.       History of prostate cancer    S/p prostatectomy.  Follow psa.       Relevant Orders   PSA, Medicare   Hypercholesterolemia    Low cholesterol diet and exercise.  Follow lipid panel.   Lab Results  Component Value Date   CHOL 198 02/24/2017   HDL 53.00 02/24/2017     LDLCALC 105 (H) 11/25/2016   LDLDIRECT 111.0 02/24/2017   TRIG 213.0 (H) 02/24/2017   CHOLHDL 4 02/24/2017        Relevant Orders   Hepatic function panel   Lipid panel   Hypertension    Blood pressure under good control.  Continue same medication regimen.  Follow pressures.  Follow metabolic panel.        Relevant Orders   Basic metabolic panel   Malignant melanoma ([place])    Followed by dermatology.        Other Visit Diagnoses    Routine general medical examination at a health care facility    -  Primary   Hyperglycemia       Relevant Orders   Hemoglobin A1c       Einar Pheasant, MD

## 2017-03-05 ENCOUNTER — Encounter: Payer: Self-pay | Admitting: Internal Medicine

## 2017-03-05 NOTE — Assessment & Plan Note (Signed)
Low cholesterol diet and exercise.  Follow lipid panel.   Lab Results  Component Value Date   CHOL 198 02/24/2017   HDL 53.00 02/24/2017   LDLCALC 105 (H) 11/25/2016   LDLDIRECT 111.0 02/24/2017   TRIG 213.0 (H) 02/24/2017   CHOLHDL 4 02/24/2017

## 2017-03-05 NOTE — Assessment & Plan Note (Signed)
Blood pressure under good control.  Continue same medication regimen.  Follow pressures.  Follow metabolic panel.   

## 2017-03-05 NOTE — Assessment & Plan Note (Signed)
S/p EGD 08/04/16 - followed by GI.  No upper symptoms.

## 2017-03-05 NOTE — Assessment & Plan Note (Signed)
Followed by dermatology

## 2017-03-05 NOTE — Assessment & Plan Note (Signed)
He has adjusted his diet.  Maintaining his weight loss.  Discussed diet and exercise.  Follow.

## 2017-03-05 NOTE — Assessment & Plan Note (Addendum)
S/p prostatectomy.  Follow psa.  

## 2017-03-24 ENCOUNTER — Other Ambulatory Visit: Payer: Self-pay | Admitting: Internal Medicine

## 2017-03-25 NOTE — Telephone Encounter (Signed)
Last OV 03/03/2017 Next OV 07/19/2017 Last refill 12/28/2016

## 2017-03-28 ENCOUNTER — Telehealth: Payer: Self-pay | Admitting: Internal Medicine

## 2017-03-28 NOTE — Telephone Encounter (Signed)
Last OV 03/03/2017  Next OV 07/19/2017 Last refill

## 2017-03-28 NOTE — Telephone Encounter (Signed)
Ok's rx for tramadol #0 with one refill.

## 2017-03-29 NOTE — Telephone Encounter (Signed)
[facility] faxed

## 2017-03-29 NOTE — Telephone Encounter (Signed)
Printed holding for provider signature

## 2017-03-29 NOTE — Telephone Encounter (Signed)
[facility] called to follow up on pt Rx. Please advise?  Pharmacy is [facility] [address] [place], [place] - [place]   [phone] (Phone) [phone] (Fax)

## 2017-05-18 DIAGNOSIS — M5136 Other intervertebral disc degeneration, lumbar region: Secondary | ICD-10-CM | POA: Diagnosis not present

## 2017-05-18 DIAGNOSIS — M5417 Radiculopathy, lumbosacral region: Secondary | ICD-10-CM | POA: Diagnosis not present

## 2017-05-24 ENCOUNTER — Ambulatory Visit (INDEPENDENT_AMBULATORY_CARE_PROVIDER_SITE_OTHER): Payer: Medicare Other

## 2017-05-24 VITALS — BP 126/70 | HR 54 | Temp 97.7°F | Ht 68.0 in | Wt 240.8 lb

## 2017-05-24 DIAGNOSIS — Z0000 Encounter for general adult medical examination without abnormal findings: Secondary | ICD-10-CM

## 2017-05-24 DIAGNOSIS — Z1331 Encounter for screening for depression: Secondary | ICD-10-CM | POA: Diagnosis not present

## 2017-05-24 NOTE — Patient Instructions (Addendum)
  Hector [name] , Thank you for taking time to come for your Medicare Wellness Visit. I appreciate your ongoing commitment to your health goals. Please review the following plan we discussed and let me know if I can assist you in the future.   Follow up with Dr. Nicki Reaper as needed.    Have a great day!  These are the goals we discussed: Goals    . Increase physical activity          Chair exercises with wife. Walk for exercise as tolerated.    . Increase water intake          Stay hydrated       This is a list of the screening recommended for you and due dates:  Health Maintenance  Topic Date Due  . Tetanus Vaccine  04/17/2024  . Flu Shot  Completed  . Pneumonia vaccines  Completed

## 2017-05-24 NOTE — Progress Notes (Signed)
Subjective:   Hector [name] is a 82 y.o. male who presents for Medicare Annual/Subsequent preventive examination.  Review of Systems:  No ROS.  Medicare Wellness Visit. Additional risk factors are reflected in the social history.  Cardiac Risk Factors include: advanced age (>19men, >75 women);male gender;hypertension;obesity (BMI >30kg/m2)     Objective:    Vitals: BP 126/70   Pulse (!) 54   Temp 97.7 F (36.5 C) (Oral)   Ht 5\' 8"  (1.727 m)   Wt 240 lb 12.8 oz (109.2 kg)   SpO2 96%   BMI 36.61 kg/m   Body mass index is 36.61 kg/m.  Tobacco History  Smoking Status  . Never Smoker  Smokeless Tobacco  . Never Used     Counseling given: Not Answered   Past Medical History:  Diagnosis Date  . Anemia   . Arthritis   . Cancer ([place])    prostate,skin  . Chicken pox   . Diverticulitis   . Dysrhythmia   . GERD (gastroesophageal reflux disease)   . Hyperlipidemia   . Hypertension   . Hypothyroidism   . Melanoma ([place])    Malignant resection  . Sleep apnea   . Ulcer    Past Surgical History:  Procedure Laterality Date  . CARDIAC CATHETERIZATION    . Cataract Surgery Right 02/13/14  . COLON SURGERY  2006-2008-2011   polyps removed  . cystocopy  2003  . ESOPHAGOGASTRODUODENOSCOPY (EGD) WITH PROPOFOL N/A 08/04/2016   Procedure: ESOPHAGOGASTRODUODENOSCOPY (EGD) WITH PROPOFOL;  Surgeon: Manya Silvas, MD;  Location: [facility] ENDOSCOPY;  Service: Endoscopy;  Laterality: N/A;  . HEMORRHOID SURGERY    . PROSTATE SURGERY    . sleep study     Family History  Problem Relation Age of Onset  . Liver disease Mother   . Heart disease Father   . Kidney disease Father    History  Sexual Activity  . Sexual activity: No    Outpatient Encounter Prescriptions as of 05/24/2017  Medication Sig  . acetaminophen (TYLENOL) 500 MG tablet Take 500 mg by mouth every 6 (six) hours as needed.  Marland Kitchen amLODipine (NORVASC) 5 MG tablet TAKE 1 TABLET BY MOUTH EVERY DAY  . aspirin 81 MG tablet  Take 81 mg by mouth daily.  Sarajane Marek Sodium 30-100 MG CAPS Take by mouth.  . fluticasone (FLONASE) 50 MCG/ACT nasal spray TWO PUFFS IN EACH NOSTRIL ONCE A DAY  . hydrochlorothiazide (HYDRODIURIL) 25 MG tablet TAKE 1 TABLET BY MOUTH EVERY DAY  . HYDROcodone-acetaminophen (NORCO/VICODIN) 5-325 MG tablet Take 1-2 tablets by mouth every 6 (six) hours as needed.  . hyoscyamine (LEVSIN, ANASPAZ) 0.125 MG tablet Take 0.125 mg by mouth as needed.  . irbesartan (AVAPRO) 300 MG tablet TAKE 1 TABLET BY MOUTH EVERY DAY  . L-Lysine 500 MG TABS Take by mouth daily.  Marland Kitchen levothyroxine (SYNTHROID, LEVOTHROID) 88 MCG tablet TAKE 1 TABLET BY MOUTH EVERY DAY  . omeprazole (PRILOSEC) 20 MG capsule TAKE ONE CAPSULE BY MOUTH TWICE A DAY  . traMADol (ULTRAM) 50 MG tablet TAKE 1 TABLET BY MOUTH TWICE A DAY AS NEEDED.  Marland Kitchen valACYclovir (VALTREX) 1000 MG tablet TAKE 1 TABLET BY MOUTH EVERY DAY.   No facility-administered encounter medications on file as of 05/24/2017.     Activities of Daily Living In your present state of health, do you have any difficulty performing the following activities: 05/24/2017 05/24/2016  Hearing? N Y  Vision? N N  Difficulty concentrating or making decisions? N N  Walking or climbing stairs? Y Y  Comment Low back pain -  Dressing or bathing? N N  Doing errands, shopping? N N  Preparing Food and eating ? N N  Using the Toilet? N N  In the past six months, have you accidently leaked urine? Y N  Comment Managed and followed by PCP. -  Do you have problems with loss of bowel control? N N  Managing your Medications? N N  Managing your Finances? N N  Housekeeping or managing your Housekeeping? Y N  Comment daughters assist -  Some recent data might be hidden    Patient Care Team: Einar Pheasant, MD as PCP - General (Internal Medicine) Einar Pheasant, MD (Internal Medicine)   Assessment:    This is a routine wellness examination for Hector [name]. The goal of the wellness  visit is to assist the patient how to close the gaps in care and create a preventative care plan for the patient.   The roster of all physicians providing medical care to patient is listed in the Snapshot section of the chart.  Taking calcium VIT D as appropriate/Osteoporosis risk reviewed.    Safety issues reviewed; Smoke and carbon monoxide detectors in the home. No firearms in the home.  Wears seatbelts when driving or riding with others. Patient does wear sunscreen or protective clothing when in direct sunlight. No violence in the home.  Depression- PHQ 2 &9 complete.  No signs/symptoms or verbal communication regarding little pleasure in doing things, feeling down, depressed or hopeless. No changes in sleeping, energy, eating, concentrating.  No thoughts of self harm or harm towards others.  Time spent on this topic is 8 minutes.   Patient is alert, normal appearance, oriented to person/place/and time.  Correctly identified the president of the [place], recall of 3/3 words, and performing simple calculations. Displays appropriate judgement and can read correct time from watch face.   No new identified risk were noted.  No failures at ADL's or IADL's.    BMI- discussed the importance of a healthy diet, water intake and the benefits of aerobic exercise. Educational material provided.   24 hour diet recall: Breakfast: egg, 2 peanut butter nabs Lunch: grilled chicken salad Dinner: soup  Daily fluid intake: 0 cups of caffeine, 1 cups of water, other non-caffeine drinks 4-5  Dental- upper dentures.  Eye- Visual acuity not assessed per patient preference since they have regular follow up with the ophthalmologist.  Wears corrective lenses.  Sleep patterns- Sleeps 7-8 hours at night.  Wakes feeling rested. CPAP not in use.  Health maintenance gaps- closed.  Patient Concerns: None at this time. Follow up with PCP as needed.  Exercise Activities and Dietary recommendations Current  Exercise Habits: The patient does not participate in regular exercise at present  Goals    . Increase physical activity          Chair exercises with wife. Walk for exercise as tolerated.    . Increase water intake          Stay hydrated      Fall Risk Fall Risk  05/24/2017 03/03/2017 05/24/2016 04/13/2016 05/13/2015  Falls in the past year? No Yes Yes No Yes  Number falls in past yr: - - 1 - 1  Injury with Fall? - No Yes - Yes  Comment - - Stable and followed by PCP - Small cut on head, L thumb pain  Follow up - - Falls prevention discussed;Education provided - Education provided;Falls prevention discussed  Depression Screen PHQ 2/9 Scores 05/24/2017 03/03/2017 05/24/2016 04/13/2016  PHQ - 2 Score 0 0 0 0  PHQ- 9 Score 0 0 - -    Cognitive Function MMSE - Mini Mental State Exam 05/24/2016 05/13/2015  Orientation to time 5 5  Orientation to Place 5 5  Registration 3 3  Attention/ Calculation 5 5  Recall 3 3  Language- name 2 objects 2 2  Language- repeat 1 1  Language- follow 3 step command 3 3  Language- read & follow direction 1 1  Write a sentence 1 1  Copy design 1 1  Total score 30 30     6CIT Screen 05/24/2017  What Year? 0 points  What month? 0 points  What time? 0 points  Count back from 20 0 points  Months in reverse 0 points  Repeat phrase 0 points  Total Score 0    Immunization History  Administered Date(s) Administered  . Influenza Split 05/03/2012, 05/01/2014  . Influenza Whole 05/09/2017  . Influenza, High Dose Seasonal PF 04/13/2016  . Influenza,inj,Quad PF,6+ Mos 05/13/2015  . Pneumococcal Conjugate-13 04/17/2014  . Pneumococcal Polysaccharide-23 05/24/2016  . Td 04/17/2014  . Zoster 05/02/2010   Screening Tests Health Maintenance  Topic Date Due  . TETANUS/TDAP  04/17/2024  . INFLUENZA VACCINE  Completed  . PNA vac Low Risk Adult  Completed      Plan:    End of life planning; Advance aging; Advanced directives discussed. Copy of  current HCPOA/Living Will on file.    I have personally reviewed and noted the following in the patient's chart:   . Medical and social history . Use of alcohol, tobacco or illicit drugs  . Current medications and supplements . Functional ability and status . Nutritional status . Physical activity . Advanced directives . List of other physicians . Hospitalizations, surgeries, and ER visits in previous 12 months . Vitals . Screenings to include cognitive, depression, and falls . Referrals and appointments  In addition, I have reviewed and discussed with patient certain preventive protocols, quality metrics, and best practice recommendations. A written personalized care plan for preventive services as well as general preventive health recommendations were provided to patient.     Varney Biles, LPN  [id number]   Reviewed above information.  Agree with assessment and plan.  Dr Nicki Reaper

## 2017-05-26 DIAGNOSIS — L821 Other seborrheic keratosis: Secondary | ICD-10-CM | POA: Diagnosis not present

## 2017-05-26 DIAGNOSIS — Z85820 Personal history of malignant melanoma of skin: Secondary | ICD-10-CM | POA: Diagnosis not present

## 2017-05-26 DIAGNOSIS — D485 Neoplasm of uncertain behavior of skin: Secondary | ICD-10-CM | POA: Diagnosis not present

## 2017-05-26 DIAGNOSIS — L814 Other melanin hyperpigmentation: Secondary | ICD-10-CM | POA: Diagnosis not present

## 2017-05-26 DIAGNOSIS — D225 Melanocytic nevi of trunk: Secondary | ICD-10-CM | POA: Diagnosis not present

## 2017-05-26 DIAGNOSIS — L570 Actinic keratosis: Secondary | ICD-10-CM | POA: Diagnosis not present

## 2017-05-26 DIAGNOSIS — D1801 Hemangioma of skin and subcutaneous tissue: Secondary | ICD-10-CM | POA: Diagnosis not present

## 2017-06-08 ENCOUNTER — Ambulatory Visit: Payer: Medicare Other | Attending: Nurse Practitioner | Admitting: Nurse Practitioner

## 2017-06-13 ENCOUNTER — Ambulatory Visit: Payer: Medicare Other | Attending: Nurse Practitioner | Admitting: Nurse Practitioner

## 2017-06-13 ENCOUNTER — Other Ambulatory Visit: Payer: Self-pay

## 2017-06-13 ENCOUNTER — Encounter: Payer: Self-pay | Admitting: Nurse Practitioner

## 2017-06-13 DIAGNOSIS — Z8619 Personal history of other infectious and parasitic diseases: Secondary | ICD-10-CM | POA: Insufficient documentation

## 2017-06-13 DIAGNOSIS — Z7409 Other reduced mobility: Secondary | ICD-10-CM | POA: Diagnosis not present

## 2017-06-13 DIAGNOSIS — I10 Essential (primary) hypertension: Secondary | ICD-10-CM | POA: Diagnosis not present

## 2017-06-13 DIAGNOSIS — Z9889 Other specified postprocedural states: Secondary | ICD-10-CM | POA: Diagnosis not present

## 2017-06-13 DIAGNOSIS — G8929 Other chronic pain: Secondary | ICD-10-CM | POA: Diagnosis not present

## 2017-06-13 DIAGNOSIS — Z841 Family history of disorders of kidney and ureter: Secondary | ICD-10-CM | POA: Insufficient documentation

## 2017-06-13 DIAGNOSIS — Z7982 Long term (current) use of aspirin: Secondary | ICD-10-CM | POA: Insufficient documentation

## 2017-06-13 DIAGNOSIS — G894 Chronic pain syndrome: Secondary | ICD-10-CM | POA: Insufficient documentation

## 2017-06-13 DIAGNOSIS — E039 Hypothyroidism, unspecified: Secondary | ICD-10-CM | POA: Diagnosis not present

## 2017-06-13 DIAGNOSIS — M545 Low back pain: Secondary | ICD-10-CM | POA: Diagnosis not present

## 2017-06-13 DIAGNOSIS — E785 Hyperlipidemia, unspecified: Secondary | ICD-10-CM | POA: Diagnosis not present

## 2017-06-13 DIAGNOSIS — Z8249 Family history of ischemic heart disease and other diseases of the circulatory system: Secondary | ICD-10-CM | POA: Diagnosis not present

## 2017-06-13 DIAGNOSIS — K2270 Barrett's esophagus without dysplasia: Secondary | ICD-10-CM | POA: Diagnosis not present

## 2017-06-13 DIAGNOSIS — Z8379 Family history of other diseases of the digestive system: Secondary | ICD-10-CM | POA: Insufficient documentation

## 2017-06-13 DIAGNOSIS — Z79899 Other long term (current) drug therapy: Secondary | ICD-10-CM | POA: Insufficient documentation

## 2017-06-13 DIAGNOSIS — Z8546 Personal history of malignant neoplasm of prostate: Secondary | ICD-10-CM | POA: Insufficient documentation

## 2017-06-13 DIAGNOSIS — Z91048 Other nonmedicinal substance allergy status: Secondary | ICD-10-CM | POA: Diagnosis not present

## 2017-06-13 DIAGNOSIS — M130 Polyarthritis, unspecified: Secondary | ICD-10-CM | POA: Diagnosis not present

## 2017-06-13 DIAGNOSIS — Z86010 Personal history of colonic polyps: Secondary | ICD-10-CM | POA: Diagnosis not present

## 2017-06-13 DIAGNOSIS — M5442 Lumbago with sciatica, left side: Secondary | ICD-10-CM

## 2017-06-13 DIAGNOSIS — Z789 Other specified health status: Secondary | ICD-10-CM | POA: Diagnosis not present

## 2017-06-13 DIAGNOSIS — Z87442 Personal history of urinary calculi: Secondary | ICD-10-CM | POA: Diagnosis not present

## 2017-06-13 DIAGNOSIS — K219 Gastro-esophageal reflux disease without esophagitis: Secondary | ICD-10-CM | POA: Diagnosis not present

## 2017-06-13 DIAGNOSIS — Z85820 Personal history of malignant melanoma of skin: Secondary | ICD-10-CM | POA: Insufficient documentation

## 2017-06-13 NOTE — Progress Notes (Signed)
Safety precautions to be maintained throughout the outpatient stay will include: orient to surroundings, keep bed in low position, maintain call bell within reach at all times, provide assistance with transfer out of bed and ambulation.  

## 2017-06-13 NOTE — Progress Notes (Signed)
Patient's Name: Hector [name]  MRN: [id number]  Referring Provider: Einar Pheasant, MD  DOB: [birth year]  PCP: Einar Pheasant, MD  DOS: 06/13/2017  Note by: Dionisio David NP  Service setting: Ambulatory outpatient  Specialty: Interventional Pain Management  Location: [facility] (AMB) Pain Management Facility    Patient type: New Patient    Primary Reason(s) for Visit: Initial Patient Evaluation CC: Back Pain (low)  HPI  Mr. Kyser is a 82 y.o. year old, male patient, who comes today for an initial evaluation. He has Hypertension; Hypercholesterolemia; Sleep apnea; History of prostate cancer; Malignant melanoma ([place]); History of colonic polyps; Barrett's esophagus; BMI 35.0-35.9,adult; Health care maintenance; URI (upper respiratory infection); Shoulder pain, left; Left hip pain; Chronic low back pain (Primary Area of Pain); Chronic pain syndrome; Other long term (current) drug therapy; Other reduced mobility; Other specified health status; and Polyarthritis, unspecified (Secondary Area of Pain) on their problem list.. His primarily concern today is the Back Pain (low)  Pain Assessment: Location: Lower Back Radiating: denies Onset: More than a month ago Duration: Chronic pain Quality:   Severity: 3 /10 (self-reported pain score)  Note: Reported level is compatible with observation.                          Effect on ADL:   Timing: Intermittent Modifying factors: rest  Onset and Duration: Gradual and Present longer than 3 months Cause of pain: Worker's Compensation Injury Severity: NAS-11 at its worse: 10/10, NAS-11 at its best: 2/10, NAS-11 now: 2/10 and NAS-11 on the average: 3/10 Timing: Not influenced by the time of the day and During activity or exercise Aggravating Factors: Bending, Prolonged standing, Walking, Walking uphill and Walking downhill Alleviating Factors: Lying down, Resting and Sitting Associated Problems: Fatigue and Spasms Quality of Pain: Agonizing, Dull and  Uncomfortable Previous Examinations or Tests: MRI scan, X-rays and Nerve conduction test Previous Treatments: Epidural steroid injections  The patient comes into the clinics today for the first time for a chronic pain management evaluation. According to the patient's primary area of pain is in his lower back. He admits that this on the left side. He denies any pain radiating down his extremities. He denies any previous surgery. [facility], he has received lumbar facet injection October 2018 which is effective. He admits that he has had epidural steroid injection in March. He denies any previous physical therapy. He has had a recent MRI.  He admits that he has other areas of stiffness however he is not interested in any treatment for these areas.  Today I took the time to provide the patient with information regarding this pain practice. The patient was informed that the practice is divided into two sections: an interventional pain management section, as well as a completely separate and distinct medication management section. I explained that there are procedure days for interventional therapies, and evaluation days for follow-ups and medication management. Because of the amount of documentation required during both, they are kept separated. This means that there is the possibility that he may be scheduled for a procedure on one day, and medication management the next. I have also informed him that because of staffing and facility limitations, this practice will no longer take patients for medication management only. To illustrate the reasons for this, I gave the patient the example of surgeons, and how inappropriate it would be to refer a patient to his care, just to  write for the post-surgical antibiotics on a surgery done by a different surgeon.   Because interventional pain management is part of the board-certified specialty for the doctors, the patient was  informed that joining this practice means that they are open to any and all interventional therapies. I made it clear that this does not mean that they will be forced to have any procedures done. What this means is that I believe interventional therapies to be essential part of the diagnosis and proper management of chronic pain conditions. Therefore, patients not interested in these interventional alternatives will be better served under the care of a different practitioner.  The patient was also made aware of my Comprehensive Pain Management Safety Guidelines where by joining this practice, they limit all of their nerve blocks and joint injections to those done by our practice, for as long as we are retained to manage their care. Historic Controlled Substance Pharmacotherapy Review  PMP and historical list of controlled substances: Tramadol 50 mg, hydrocodone/acetaminophen 7.5/325 mg, oxycodone/acetaminophen 5/325 mg, Highest opioid analgesic regimen found: Hydrocodone/acetaminophen 7.5/325 Most recent opioid analgesic: Tramadol 50 mg twice daily (fill date 05/23/2017) tramadol 100 mg daily Current opioid analgesics: Tramadol 50 mg twice daily (fill date 05/23/2017) tramadol 100 mg daily Highest recorded MME/day: 22.5 mg/day MME/day: 10 mg/day Medications: The patient did not bring the medication(s) to the appointment, as requested in our "New Patient Package" Pharmacodynamics: Desired effects: Analgesia: The patient reports >50% benefit. Reported improvement in function: The patient reports medication allows him to accomplish basic ADLs. Clinically meaningful improvement in function (CMIF): Sustained CMIF goals met Perceived effectiveness: Described as relatively effective, allowing for increase in activities of daily living (ADL) Undesirable effects: Side-effects or Adverse reactions: None reported Historical Monitoring: The patient  reports that he does not use drugs. List of all UDS  Test(s): No results found for: MDMA, COCAINSCRNUR, PCPSCRNUR, PCPQUANT, CANNABQUANT, THCU, [place] List of all Serum Drug Screening Test(s):  No results found for: AMPHSCRSER, BARBSCRSER, BENZOSCRSER, COCAINSCRSER, PCPSCRSER, PCPQUANT, THCSCRSER, CANNABQUANT, OPIATESCRSER, OXYSCRSER, PROPOXSCRSER Historical Background Evaluation: [place] PDMP: Six (6) year initial data search conducted.             [place] Department of public safety, offender search: Editor, commissioning Information) Non-contributory Risk Assessment Profile: Aberrant behavior: None observed or detected today Risk factors for fatal opioid overdose: caucasian and male gender Fatal overdose hazard ratio (HR): Calculation deferred Non-fatal overdose hazard ratio (HR): Calculation deferred Risk of opioid abuse or dependence: 0.7-3.0% with doses ? 36 MME/day and 6.1-26% with doses ? 120 MME/day. Substance use disorder (SUD) risk level: Pending results of Medical Psychology Evaluation for SUD Opioid risk tool (ORT) (Total Score): 0  ORT Scoring interpretation table:  Score <3 = Low Risk for SUD  Score between 4-7 = Moderate Risk for SUD  Score >8 = High Risk for Opioid Abuse   PHQ-2 Depression Scale:  Total score: 0  PHQ-2 Scoring interpretation table: (Score and probability of major depressive disorder)  Score 0 = No depression  Score 1 = 15.4% Probability  Score 2 = 21.1% Probability  Score 3 = 38.4% Probability  Score 4 = 45.5% Probability  Score 5 = 56.4% Probability  Score 6 = 78.6% Probability   PHQ-9 Depression Scale:  Total score: 0  PHQ-9 Scoring interpretation table:  Score 0-4 = No depression  Score 5-9 = Mild depression  Score 10-14 = Moderate depression  Score 15-19 = Moderately severe depression  Score 20-27 = Severe depression (2.4 times higher risk  of SUD and 2.89 times higher risk of overuse)   Pharmacologic Plan: Pending ordered tests and/or consults  Meds  The patient has a current medication list which includes the  following prescription(s): acetaminophen, amlodipine, aspirin, casanthranol-docusate sodium, fluticasone, hydrochlorothiazide, hydrocodone-acetaminophen, hyoscyamine, irbesartan, l-lysine, levothyroxine, omeprazole, tramadol, and valacyclovir.  Current Outpatient Medications on File Prior to Visit  Medication Sig  . acetaminophen (TYLENOL) 500 MG tablet Take 500 mg by mouth every 6 (six) hours as needed.  Marland Kitchen amLODipine (NORVASC) 5 MG tablet TAKE 1 TABLET BY MOUTH EVERY DAY  . aspirin 81 MG tablet Take 81 mg by mouth daily.  Sarajane Marek Sodium 30-100 MG CAPS Take by mouth.  . fluticasone (FLONASE) 50 MCG/ACT nasal spray TWO PUFFS IN EACH NOSTRIL ONCE A DAY  . hydrochlorothiazide (HYDRODIURIL) 25 MG tablet TAKE 1 TABLET BY MOUTH EVERY DAY  . HYDROcodone-acetaminophen (NORCO/VICODIN) 5-325 MG tablet Take 1-2 tablets by mouth every 6 (six) hours as needed.  . hyoscyamine (LEVSIN, ANASPAZ) 0.125 MG tablet Take 0.125 mg by mouth as needed.  . irbesartan (AVAPRO) 300 MG tablet TAKE 1 TABLET BY MOUTH EVERY DAY  . L-Lysine 500 MG TABS Take by mouth daily.  Marland Kitchen levothyroxine (SYNTHROID, LEVOTHROID) 88 MCG tablet TAKE 1 TABLET BY MOUTH EVERY DAY  . omeprazole (PRILOSEC) 20 MG capsule TAKE ONE CAPSULE BY MOUTH TWICE A DAY  . traMADol (ULTRAM) 50 MG tablet TAKE 1 TABLET BY MOUTH TWICE A DAY AS NEEDED.  Marland Kitchen valACYclovir (VALTREX) 1000 MG tablet TAKE 1 TABLET BY MOUTH EVERY DAY.   No current facility-administered medications on file prior to visit.    Imaging Review  Lumbosacral Imaging: Lumbar MR wo contrast:  Results for orders placed during the hospital encounter of 10/19/16  MR LUMBAR SPINE WO CONTRAST   Narrative CLINICAL DATA:  Low back pain with left leg pain. History of prostate cancer and melanoma.  EXAM: MRI LUMBAR SPINE WITHOUT CONTRAST  TECHNIQUE: Multiplanar, multisequence MR imaging of the lumbar spine was performed. No intravenous contrast was administered.  COMPARISON:   Lumbar radiographs 09/07/2016.  FINDINGS: Segmentation:  Normal.  Lowest disc space L5-S1.  Alignment: 6 mm anterolisthesis L5-S1 due to bilateral pars defects of L5. Remaining alignment normal.  Vertebrae:  Negative for fracture or mass lesion.  Conus medullaris: Extends to the L1 level and appears normal.  Paraspinal and other soft tissues: Negative  Disc levels:  L1-2:  Negative  L2-3:  Mild disc degeneration without stenosis  L3-4: Mild disc and mild facet degeneration without stenosis. Mild endplate spurring  J6-8: Mild disc and facet degeneration without stenosis. Mild endplate spurring  T1-X7: Bilateral pars defects of L5 with grade 1 anterolisthesis. Moderate right foraminal encroachment and marked left foraminal encroachment. Compression of the left L5 nerve root.  IMPRESSION: Grade 1 anterolisthesis L5-S1 due to bilateral pars defects. Marked left foraminal encroachment and moderate right foraminal encroachment at L5-S1.   Electronically Signed   By: Franchot Gallo M.D.   On: 10/19/2016 10:11   Lumbar DG (Complete) 4+V:  Results for orders placed during the hospital encounter of 09/07/16  DG Lumbar Spine Complete   Narrative CLINICAL DATA:  Low back pain since November 2017.  EXAM: LUMBAR SPINE - COMPLETE 4+ VIEW  COMPARISON:  CT scan 10/03/2006  FINDINGS: There are bilateral pars defects at L5 with a grade 1 spondylolisthesis and associated degenerative disc disease and facet disease at L5-S1. The other lumbar vertebral bodies are normally aligned. Disc spaces are maintained. Small marginal osteophytes. The  visualized bony pelvis is intact. The SI joints appear normal.  IMPRESSION: Bilateral pars defects at L5 with a grade 1 spondylolisthesis and associated degenerative disc disease and facet disease at L5-S1.  No acute bony findings.   Electronically Signed   By: Marijo Sanes M.D.   On: 09/07/2016 13:52     Note: Available results  from prior imaging studies were reviewed.        ROS  Cardiovascular History: Abnormal heart rhythm, Daily Aspirin intake and High blood pressure Pulmonary or Respiratory History: Snoring  and Temporary stoppage of breathing during sleep Neurological History: No reported neurological signs or symptoms such as seizures, abnormal skin sensations, urinary and/or fecal incontinence, being born with an abnormal open spine and/or a tethered spinal cord Review of Past Neurological Studies: No results found for this or any previous visit. Psychological-Psychiatric History: No reported psychological or psychiatric signs or symptoms such as difficulty sleeping, anxiety, depression, delusions or hallucinations (schizophrenial), mood swings (bipolar disorders) or suicidal ideations or attempts Gastrointestinal History: Vomiting blood (Ulcers), Reflux or heatburn and Irregular, infrequent bowel movements (Constipation) Genitourinary History: Passing kidney stones Hematological History: No reported hematological signs or symptoms such as prolonged bleeding, low or poor functioning platelets, bruising or bleeding easily, hereditary bleeding problems, low energy levels due to low hemoglobin or being anemic Endocrine History: Slow thyroid Rheumatologic History: No reported rheumatological signs and symptoms such as fatigue, joint pain, tenderness, swelling, redness, heat, stiffness, decreased range of motion, with or without associated rash Musculoskeletal History: Negative for myasthenia gravis, muscular dystrophy, multiple sclerosis or malignant hyperthermia Work History: Retired  Allergies  Mr. Roser is allergic to tape.  Laboratory Chemistry  Inflammation Markers No results found for: CRP, ESRSEDRATE (CRP: Acute Phase) (ESR: Chronic Phase) Renal Function Markers Lab Results  Component Value Date   BUN 14 02/24/2017   CREATININE 1.19 02/24/2017   Hepatic Function Markers Lab Results  Component Value  Date   AST 21 02/24/2017   ALT 26 02/24/2017   ALBUMIN 4.2 02/24/2017   ALKPHOS 47 02/24/2017   Electrolytes Lab Results  Component Value Date   NA 141 02/24/2017   K 3.7 02/24/2017   CL 102 02/24/2017   CALCIUM 10.0 02/24/2017   Neuropathy Markers No results found for: [id number] Bone Pathology Markers Lab Results  Component Value Date   ALKPHOS 47 02/24/2017   CALCIUM 10.0 02/24/2017   Coagulation Parameters Lab Results  Component Value Date   PLT 247.0 11/25/2016   Cardiovascular Markers Lab Results  Component Value Date   HGB 15.7 11/25/2016   HCT 46.4 11/25/2016   Note: Lab results reviewed.  [place]  Drug: Mr. Haen  reports that he does not use drugs. Alcohol:  reports that he does not drink alcohol. Tobacco:  reports that  has never smoked. he has never used smokeless tobacco. Medical:  has a past medical history of Anemia, Arthritis, Cancer ([place]), Chicken pox, Diverticulitis, Dysrhythmia, GERD (gastroesophageal reflux disease), Hyperlipidemia, Hypertension, Hypothyroidism, Melanoma ([place]), Sleep apnea, and Ulcer. Family: family history includes Heart disease in his father; Kidney disease in his father; Liver disease in his mother.  Past Surgical History:  Procedure Laterality Date  . CARDIAC CATHETERIZATION    . Cataract Surgery Right 02/13/14  . COLON SURGERY  2006-2008-2011   polyps removed  . cystocopy  2003  . HEMORRHOID SURGERY    . PROSTATE SURGERY    . sleep study     Active Ambulatory Problems    Diagnosis Date Noted  .  Hypertension 08/10/2012  . Hypercholesterolemia 08/10/2012  . Sleep apnea 08/13/2012  . History of prostate cancer 08/13/2012  . Malignant melanoma ([place]) 08/13/2012  . History of colonic polyps 10/01/2012  . Barrett's esophagus 12/07/2012  . BMI 35.0-35.9,adult 02/23/2014  . Health care maintenance 02/22/2015  . URI (upper respiratory infection) 06/09/2015  . Shoulder pain, left 08/29/2016  . Left hip pain 08/29/2016  .  Chronic low back pain (Primary Area of Pain) 06/13/2017  . Chronic pain syndrome 06/13/2017  . Other long term (current) drug therapy 06/13/2017  . Other reduced mobility 06/13/2017  . Other specified health status 06/13/2017  . Polyarthritis, unspecified (Secondary Area of Pain) 06/13/2017   Resolved Ambulatory Problems    Diagnosis Date Noted  . No Resolved Ambulatory Problems   Past Medical History:  Diagnosis Date  . Anemia   . Arthritis   . Cancer ([place])   . Chicken pox   . Diverticulitis   . Dysrhythmia   . GERD (gastroesophageal reflux disease)   . Hyperlipidemia   . Hypertension   . Hypothyroidism   . Melanoma ([place])   . Sleep apnea   . Ulcer    Constitutional Exam  General appearance: Well nourished, well developed, and well hydrated. In no apparent acute distress Vitals:   06/13/17 1300 06/13/17 1302  BP:  (!) 149/69  Pulse: 65   Resp: 18   Temp: 97.9 F (36.6 C)   SpO2: 99%   Weight: 240 lb (108.9 kg)   Height: _0  (1.727 m)    BMI Assessment: Estimated body mass index is 36.49 kg/m as calculated from the following:   Height as of this encounter: _1  (1.727 m).   Weight as of this encounter: 240 lb (108.9 kg).  BMI interpretation table: BMI level Category Range association with higher incidence of chronic pain  <18 kg/m2 Underweight   18.5-24.9 kg/m2 Ideal body weight   25-29.9 kg/m2 Overweight Increased incidence by 20%  30-34.9 kg/m2 Obese (Class I) Increased incidence by 68%  35-39.9 kg/m2 Severe obesity (Class II) Increased incidence by 136%  >40 kg/m2 Extreme obesity (Class III) Increased incidence by 254%   BMI Readings from Last 4 Encounters:  06/13/17 36.49 kg/m  05/24/17 36.61 kg/m  03/03/17 35.82 kg/m  10/28/16 35.82 kg/m   Wt Readings from Last 4 Encounters:  06/13/17 240 lb (108.9 kg)  05/24/17 240 lb 12.8 oz (109.2 kg)  03/03/17 235 lb 9.6 oz (106.9 kg)  10/28/16 235 lb 9.6 oz (106.9 kg)  Psych/Mental status: Alert,  oriented x 3 (person, place, & time)       Eyes: PERLA Respiratory: No evidence of acute respiratory distress  Lumbar Spine Exam  Inspection: No masses, redness, or swelling Alignment: Symmetrical Functional ROM: Unrestricted ROM      Stability: No instability detected Muscle strength & Tone: Functionally intact Sensory: Unimpaired Palpation: Complains of area being tender to palpation       Provocative Tests: Lumbar Hyperextension and rotation test: Positive on the left for facet joint pain. Patrick's Maneuver: Unable to perform                    Gait & Posture Assessment  Ambulation: Unassisted Gait: Relatively normal for age and body habitus Posture: WNL   Lower Extremity Exam    Side: Right lower extremity  Side: Left lower extremity  Inspection: No masses, redness, swelling, or asymmetry. No contractures  Inspection: No masses, redness, swelling, or asymmetry. No contractures  Functional  ROM: Unrestricted ROM          Functional ROM: Unrestricted ROM          Muscle strength & Tone: Functionally intact  Muscle strength & Tone: Functionally intact  Sensory: Unimpaired  Sensory: Unimpaired  Palpation: No palpable anomalies  Palpation: No palpable anomalies   Assessment  Primary Diagnosis & Pertinent Problem List: Diagnoses of Chronic left-sided low back pain without sciatica, Chronic pain syndrome, Other long term (current) drug therapy, Other reduced mobility, Other specified health status, and Polyarthritis, unspecified were pertinent to this visit.  Visit Diagnosis: 1. Chronic left-sided low back pain without sciatica   2. Chronic pain syndrome   3. Other long term (current) drug therapy   4. Other reduced mobility   5. Other specified health status   6. Polyarthritis, unspecified    Plan of Care  Initial treatment plan:  Please be advised that as per protocol, today's visit has been an evaluation only. We have not taken over the patient's controlled substance  management.  Problem-specific plan: No problem-specific Assessment & Plan notes found for this encounter.  Ordered Lab-work, Procedure(s), Referral(s), & Consult(s): Orders Placed This Encounter  Procedures  . Comp. Metabolic Panel (12)  . C-reactive protein  . Sedimentation rate  . Magnesium  . 25-Hydroxyvitamin D Lcms D2+D3  . Vitamin B12   Pharmacotherapy: Medications ordered:  No orders of the defined types were placed in this encounter.  Medications administered during this visit: Alma A. Lem had no medications administered during this visit.   Pharmacotherapy under consideration:  Opioid Analgesics: The patient was informed that there is no guarantee that he would be a candidate for opioid analgesics. The decision will be made following CDC guidelines. This decision will be based on the results of diagnostic studies, as well as Mr. Milhoan's risk profile. Procedures Only gets Tramadol for PCP  Membrane stabilizer: To be determined at a later time Muscle relaxant: To be determined at a later time NSAID: To be determined at a later time Other analgesic(s): To be determined at a later time   Interventional therapies under consideration: Mr. Payeur was informed that there is no guarantee that he would be a candidate for interventional therapies. The decision will be based on the results of diagnostic studies, as well as Mr. Delavega's risk profile.  Possible procedure(s): Diagnostic left sided lumbar facet nerve block Possible left-sided lumbar radiofrequency ablation Possible left-sided LESI    Provider-requested follow-up: Return for 2nd Visit, w/ Dr. Dossie Arbour.  Future Appointments  Date Time Provider [place]  06/20/2017  9:30 AM Milinda Pointer, MD [facility] None  07/15/2017  8:15 AM [facility]  07/19/2017  8:00 AM Einar Pheasant, MD [facility]  05/25/2018  9:00 AM O'Brien-Blaney, Bryson Corona, LPN [facility]    Primary Care Physician:  Einar Pheasant, MD Location: [facility] Note by:  Date: 06/13/2017; Time: 2:52 PM  Pain Score Disclaimer: We use the NRS-11 scale. This is a self-reported, subjective measurement of pain severity with only modest accuracy. It is used primarily to identify changes within a particular patient. It must be understood that outpatient pain scales are significantly less accurate that those used for research, where they can be applied under ideal controlled circumstances with minimal exposure to variables. In reality, the score is likely to be a combination of pain intensity and pain affect, where pain affect describes the degree of emotional arousal or changes in action readiness caused by the sensory  experience of pain. Factors such as social and work situation, setting, emotional state, anxiety levels, expectation, and prior pain experience may influence pain perception and show large inter-individual differences that may also be affected by time variables.  Patient instructions provided during this appointment: Patient Instructions    ____________________________________________________________________________________________  Appointment Policy Summary  It is our goal and responsibility to provide the medical community with assistance in the evaluation and management of patients with chronic pain. Unfortunately our resources are limited. Because we do not have an unlimited amount of time, or available appointments, we are required to closely monitor and manage their use. The following rules exist to maximize their use:  Patient's responsibilities: 1. Punctuality:  At what time should I arrive? You should be physically present in our office 30 minutes before your scheduled appointment. Your scheduled appointment is with your assigned healthcare provider. However, it takes 5-10 minutes to be "checked-in", and another 15 minutes for the nurses to do the admission. If you  arrive to our office at the time you were given for your appointment, you will end up being at least 20-25 minutes late to your appointment with the provider. 2. Tardiness:  What happens if I arrive only a few minutes after my scheduled appointment time? You will need to reschedule your appointment. The cutoff is your appointment time. This is why it is so important that you arrive at least 30 minutes before that appointment. If you have an appointment scheduled for 10:00 AM and you arrive at 10:01, you will be required to reschedule your appointment.  3. Plan ahead:  Always assume that you will encounter traffic on your way in. Plan for it. If you are dependent on a driver, make sure they understand these rules and the need to arrive early. 4. Other appointments and responsibilities:  Avoid scheduling any other appointments before or after your pain clinic appointments.  5. Be prepared:  Write down everything that you need to discuss with your healthcare provider and give this information to the admitting nurse. Write down the medications that you will need refilled. Bring your pills and bottles (even the empty ones), to all of your appointments, except for those where a procedure is scheduled. 6. No children or pets:  Find someone to take care of them. It is not appropriate to bring them in. 7. Scheduling changes:  We request "advanced notification" of any changes or cancellations. 8. Advanced notification:  Defined as a time period of more than 24 hours prior to the originally scheduled appointment. This allows for the appointment to be offered to other patients. 9. Rescheduling:  When a visit is rescheduled, it will require the cancellation of the original appointment. For this reason they both fall within the category of "Cancellations".  10. Cancellations:  They require advanced notification. Any cancellation less than 24 hours before the  appointment will be recorded as a "No Show". 11. No  Show:  Defined as an unkept appointment where the patient failed to notify or declare to the practice their intention or inability to keep the appointment.  Corrective process for repeat offenders:  1. Tardiness: Three (3) episodes of rescheduling due to late arrivals will be recorded as one (1) "No Show". 2. Cancellation or reschedule: Three (3) cancellations or rescheduling will be recorded as one (1) "No Show". 3. "No Shows": Three (3) "No Shows" within a 12 month period will result in discharge from the practice.  ____________________________________________________________________________________________   ____________________________________________________________________________________________  Pain Scale  Introduction:  The pain score used by this practice is the Verbal Numerical Rating Scale (VNRS-11). This is an 11-point scale. It is for adults and children 10 years or older. There are significant differences in how the pain score is reported, used, and applied. Forget everything you learned in the past and learn this scoring system.  General Information: The scale should reflect your current level of pain. Unless you are specifically asked for the level of your worst pain, or your average pain. If you are asked for one of these two, then it should be understood that it is over the past 24 hours.  Basic Activities of Daily Living (ADL): Personal hygiene, dressing, eating, transferring, and using restroom.  Instructions: Most patients tend to report their level of pain as a combination of two factors, their physical pain and their psychosocial pain. This last one is also known as "suffering" and it is reflection of how physical pain affects you socially and psychologically. From now on, report them separately. From this point on, when asked to report your pain level, report only your physical pain. Use the following table for reference.  Pain Clinic Pain Levels (0-5/10)  Pain Level  Score  Description  No Pain 0   Mild pain 1 Nagging, annoying, but does not interfere with basic activities of daily living (ADL). Patients are able to eat, bathe, get dressed, toileting (being able to get on and off the toilet and perform personal hygiene functions), transfer (move in and out of bed or a chair without assistance), and maintain continence (able to control bladder and bowel functions). Blood pressure and heart rate are unaffected. A normal heart rate for a healthy adult ranges from 60 to 100 bpm (beats per minute).   Mild to moderate pain 2 Noticeable and distracting. Impossible to hide from other people. More frequent flare-ups. Still possible to adapt and function close to normal. It can be very annoying and may have occasional stronger flare-ups. With discipline, patients may get used to it and adapt.   Moderate pain 3 Interferes significantly with activities of daily living (ADL). It becomes difficult to feed, bathe, get dressed, get on and off the toilet or to perform personal hygiene functions. Difficult to get in and out of bed or a chair without assistance. Very distracting. With effort, it can be ignored when deeply involved in activities.   Moderately severe pain 4 Impossible to ignore for more than a few minutes. With effort, patients may still be able to manage work or participate in some social activities. Very difficult to concentrate. Signs of autonomic nervous system discharge are evident: dilated pupils (mydriasis); mild sweating (diaphoresis); sleep interference. Heart rate becomes elevated (>115 bpm). Diastolic blood pressure (lower number) rises above 100 mmHg. Patients find relief in laying down and not moving.   Severe pain 5 Intense and extremely unpleasant. Associated with frowning face and frequent crying. Pain overwhelms the senses.  Ability to do any activity or maintain social relationships becomes significantly limited. Conversation becomes difficult. Pacing  back and forth is common, as getting into a comfortable position is nearly impossible. Pain wakes you up from deep sleep. Physical signs will be obvious: pupillary dilation; increased sweating; goosebumps; brisk reflexes; cold, clammy hands and feet; nausea, vomiting or dry heaves; loss of appetite; significant sleep disturbance with inability to fall asleep or to remain asleep. When persistent, significant weight loss is observed due to the complete loss of appetite and sleep deprivation.  Blood pressure and heart rate becomes  significantly elevated. Caution: If elevated blood pressure triggers a pounding headache associated with blurred vision, then the patient should immediately seek attention at an urgent or emergency care unit, as these may be signs of an impending stroke.    Emergency Department Pain Levels (6-10/10)  Emergency Room Pain 6 Severely limiting. Requires emergency care and should not be seen or managed at an outpatient pain management facility. Communication becomes difficult and requires great effort. Assistance to reach the emergency department may be required. Facial flushing and profuse sweating along with potentially dangerous increases in heart rate and blood pressure will be evident.   Distressing pain 7 Self-care is very difficult. Assistance is required to transport, or use restroom. Assistance to reach the emergency department will be required. Tasks requiring coordination, such as bathing and getting dressed become very difficult.   Disabling pain 8 Self-care is no longer possible. At this level, pain is disabling. The individual is unable to do even the most "basic" activities such as walking, eating, bathing, dressing, transferring to a bed, or toileting. Fine motor skills are lost. It is difficult to think clearly.   Incapacitating pain 9 Pain becomes incapacitating. Thought processing is no longer possible. Difficult to remember your own name. Control of movement and  coordination are lost.   The worst pain imaginable 10 At this level, most patients pass out from pain. When this level is reached, collapse of the autonomic nervous system occurs, leading to a sudden drop in blood pressure and heart rate. This in turn results in a temporary and dramatic drop in blood flow to the brain, leading to a loss of consciousness. Fainting is one of the body's self defense mechanisms. Passing out puts the brain in a calmed state and causes it to shut down for a while, in order to begin the healing process.    Summary: 1. Refer to this scale when providing [place] with your pain level. 2. Be accurate and careful when reporting your pain level. This will help with your care. 3. Over-reporting your pain level will lead to loss of credibility. 4. Even a level of 1/10 means that there is pain and will be treated at our facility. 5. High, inaccurate reporting will be documented as "Symptom Exaggeration", leading to loss of credibility and suspicions of possible secondary gains such as obtaining more narcotics, or wanting to appear disabled, for fraudulent reasons. 6. Only pain levels of 5 or below will be seen at our facility. 7. Pain levels of 6 and above will be sent to the Emergency Department and the appointment cancelled. ____________________________________________________________________________________________   BMI Assessment: Estimated body mass index is 36.49 kg/m as calculated from the following:   Height as of this encounter: _0  (1.727 m).   Weight as of this encounter: 240 lb (108.9 kg).  BMI interpretation table: BMI level Category Range association with higher incidence of chronic pain  <18 kg/m2 Underweight   18.5-24.9 kg/m2 Ideal body weight   25-29.9 kg/m2 Overweight Increased incidence by 20%  30-34.9 kg/m2 Obese (Class I) Increased incidence by 68%  35-39.9 kg/m2 Severe obesity (Class II) Increased incidence by 136%  >40 kg/m2 Extreme obesity (Class  III) Increased incidence by 254%   BMI Readings from Last 4 Encounters:  06/13/17 36.49 kg/m  05/24/17 36.61 kg/m  03/03/17 35.82 kg/m  10/28/16 35.82 kg/m   Wt Readings from Last 4 Encounters:  06/13/17 240 lb (108.9 kg)  05/24/17 240 lb 12.8 oz (109.2 kg)  03/03/17 235 lb 9.6  oz (106.9 kg)  10/28/16 235 lb 9.6 oz (106.9 kg)

## 2017-06-13 NOTE — Patient Instructions (Addendum)
____________________________________________________________________________________________  Appointment Policy Summary  It is our goal and responsibility to provide the medical community with assistance in the evaluation and management of patients with chronic pain. Unfortunately our resources are limited. Because we do not have an unlimited amount of time, or available appointments, we are required to closely monitor and manage their use. The following rules exist to maximize their use:  Patient's responsibilities: 1. Punctuality:  At what time should I arrive? You should be physically present in our office 30 minutes before your scheduled appointment. Your scheduled appointment is with your assigned healthcare provider. However, it takes 5-10 minutes to be "checked-in", and another 15 minutes for the nurses to do the admission. If you arrive to our office at the time you were given for your appointment, you will end up being at least 20-25 minutes late to your appointment with the provider. 2. Tardiness:  What happens if I arrive only a few minutes after my scheduled appointment time? You will need to reschedule your appointment. The cutoff is your appointment time. This is why it is so important that you arrive at least 30 minutes before that appointment. If you have an appointment scheduled for 10:00 AM and you arrive at 10:01, you will be required to reschedule your appointment.  3. Plan ahead:  Always assume that you will encounter traffic on your way in. Plan for it. If you are dependent on a driver, make sure they understand these rules and the need to arrive early. 4. Other appointments and responsibilities:  Avoid scheduling any other appointments before or after your pain clinic appointments.  5. Be prepared:  Write down everything that you need to discuss with your healthcare provider and give this information to the admitting nurse. Write down the medications that you will need  refilled. Bring your pills and bottles (even the empty ones), to all of your appointments, except for those where a procedure is scheduled. 6. No children or pets:  Find someone to take care of them. It is not appropriate to bring them in. 7. Scheduling changes:  We request "advanced notification" of any changes or cancellations. 8. Advanced notification:  Defined as a time period of more than 24 hours prior to the originally scheduled appointment. This allows for the appointment to be offered to other patients. 9. Rescheduling:  When a visit is rescheduled, it will require the cancellation of the original appointment. For this reason they both fall within the category of "Cancellations".  10. Cancellations:  They require advanced notification. Any cancellation less than 24 hours before the  appointment will be recorded as a "No Show". 11. No Show:  Defined as an unkept appointment where the patient failed to notify or declare to the practice their intention or inability to keep the appointment.  Corrective process for repeat offenders:  1. Tardiness: Three (3) episodes of rescheduling due to late arrivals will be recorded as one (1) "No Show". 2. Cancellation or reschedule: Three (3) cancellations or rescheduling will be recorded as one (1) "No Show". 3. "No Shows": Three (3) "No Shows" within a 12 month period will result in discharge from the practice.  ____________________________________________________________________________________________   ____________________________________________________________________________________________  Pain Scale  Introduction: The pain score used by this practice is the Verbal Numerical Rating Scale (VNRS-11). This is an 11-point scale. It is for adults and children 10 years or older. There are significant differences in how the pain score is reported, used, and applied. Forget everything you learned in the past  and learn this scoring  system.  General Information: The scale should reflect your current level of pain. Unless you are specifically asked for the level of your worst pain, or your average pain. If you are asked for one of these two, then it should be understood that it is over the past 24 hours.  Basic Activities of Daily Living (ADL): Personal hygiene, dressing, eating, transferring, and using restroom.  Instructions: Most patients tend to report their level of pain as a combination of two factors, their physical pain and their psychosocial pain. This last one is also known as "suffering" and it is reflection of how physical pain affects you socially and psychologically. From now on, report them separately. From this point on, when asked to report your pain level, report only your physical pain. Use the following table for reference.  Pain Clinic Pain Levels (0-5/10)  Pain Level Score  Description  No Pain 0   Mild pain 1 Nagging, annoying, but does not interfere with basic activities of daily living (ADL). Patients are able to eat, bathe, get dressed, toileting (being able to get on and off the toilet and perform personal hygiene functions), transfer (move in and out of bed or a chair without assistance), and maintain continence (able to control bladder and bowel functions). Blood pressure and heart rate are unaffected. A normal heart rate for a healthy adult ranges from 60 to 100 bpm (beats per minute).   Mild to moderate pain 2 Noticeable and distracting. Impossible to hide from other people. More frequent flare-ups. Still possible to adapt and function close to normal. It can be very annoying and may have occasional stronger flare-ups. With discipline, patients may get used to it and adapt.   Moderate pain 3 Interferes significantly with activities of daily living (ADL). It becomes difficult to feed, bathe, get dressed, get on and off the toilet or to perform personal hygiene functions. Difficult to get in and out of  bed or a chair without assistance. Very distracting. With effort, it can be ignored when deeply involved in activities.   Moderately severe pain 4 Impossible to ignore for more than a few minutes. With effort, patients may still be able to manage work or participate in some social activities. Very difficult to concentrate. Signs of autonomic nervous system discharge are evident: dilated pupils (mydriasis); mild sweating (diaphoresis); sleep interference. Heart rate becomes elevated (>115 bpm). Diastolic blood pressure (lower number) rises above 100 mmHg. Patients find relief in laying down and not moving.   Severe pain 5 Intense and extremely unpleasant. Associated with frowning face and frequent crying. Pain overwhelms the senses.  Ability to do any activity or maintain social relationships becomes significantly limited. Conversation becomes difficult. Pacing back and forth is common, as getting into a comfortable position is nearly impossible. Pain wakes you up from deep sleep. Physical signs will be obvious: pupillary dilation; increased sweating; goosebumps; brisk reflexes; cold, clammy hands and feet; nausea, vomiting or dry heaves; loss of appetite; significant sleep disturbance with inability to fall asleep or to remain asleep. When persistent, significant weight loss is observed due to the complete loss of appetite and sleep deprivation.  Blood pressure and heart rate becomes significantly elevated. Caution: If elevated blood pressure triggers a pounding headache associated with blurred vision, then the patient should immediately seek attention at an urgent or emergency care unit, as these may be signs of an impending stroke.    Emergency Department Pain Levels (6-10/10)  Emergency Room Pain  6 Severely limiting. Requires emergency care and should not be seen or managed at an outpatient pain management facility. Communication becomes difficult and requires great effort. Assistance to reach the  emergency department may be required. Facial flushing and profuse sweating along with potentially dangerous increases in heart rate and blood pressure will be evident.   Distressing pain 7 Self-care is very difficult. Assistance is required to transport, or use restroom. Assistance to reach the emergency department will be required. Tasks requiring coordination, such as bathing and getting dressed become very difficult.   Disabling pain 8 Self-care is no longer possible. At this level, pain is disabling. The individual is unable to do even the most "basic" activities such as walking, eating, bathing, dressing, transferring to a bed, or toileting. Fine motor skills are lost. It is difficult to think clearly.   Incapacitating pain 9 Pain becomes incapacitating. Thought processing is no longer possible. Difficult to remember your own name. Control of movement and coordination are lost.   The worst pain imaginable 10 At this level, most patients pass out from pain. When this level is reached, collapse of the autonomic nervous system occurs, leading to a sudden drop in blood pressure and heart rate. This in turn results in a temporary and dramatic drop in blood flow to the brain, leading to a loss of consciousness. Fainting is one of the body's self defense mechanisms. Passing out puts the brain in a calmed state and causes it to shut down for a while, in order to begin the healing process.    Summary: 1. Refer to this scale when providing [place] with your pain level. 2. Be accurate and careful when reporting your pain level. This will help with your care. 3. Over-reporting your pain level will lead to loss of credibility. 4. Even a level of 1/10 means that there is pain and will be treated at our facility. 5. High, inaccurate reporting will be documented as "Symptom Exaggeration", leading to loss of credibility and suspicions of possible secondary gains such as obtaining more narcotics, or wanting to appear  disabled, for fraudulent reasons. 6. Only pain levels of 5 or below will be seen at our facility. 7. Pain levels of 6 and above will be sent to the Emergency Department and the appointment cancelled. ____________________________________________________________________________________________   BMI Assessment: Estimated body mass index is 36.49 kg/m as calculated from the following:   Height as of this encounter: 5\' 8"  (1.727 m).   Weight as of this encounter: 240 lb (108.9 kg).  BMI interpretation table: BMI level Category Range association with higher incidence of chronic pain  <18 kg/m2 Underweight   18.5-24.9 kg/m2 Ideal body weight   25-29.9 kg/m2 Overweight Increased incidence by 20%  30-34.9 kg/m2 Obese (Class I) Increased incidence by 68%  35-39.9 kg/m2 Severe obesity (Class II) Increased incidence by 136%  >40 kg/m2 Extreme obesity (Class III) Increased incidence by 254%   BMI Readings from Last 4 Encounters:  06/13/17 36.49 kg/m  05/24/17 36.61 kg/m  03/03/17 35.82 kg/m  10/28/16 35.82 kg/m   Wt Readings from Last 4 Encounters:  06/13/17 240 lb (108.9 kg)  05/24/17 240 lb 12.8 oz (109.2 kg)  03/03/17 235 lb 9.6 oz (106.9 kg)  10/28/16 235 lb 9.6 oz (106.9 kg)

## 2017-06-20 ENCOUNTER — Ambulatory Visit: Payer: Medicare Other | Admitting: Pain Medicine

## 2017-06-21 LAB — COMP. METABOLIC PANEL (12)
ALBUMIN: 4.3 g/dL (ref 3.5–4.7)
AST: 29 IU/L (ref 0–40)
Albumin/Globulin Ratio: 1.5 (ref 1.2–2.2)
Alkaline Phosphatase: 60 IU/L (ref 39–117)
BILIRUBIN TOTAL: 0.4 mg/dL (ref 0.0–1.2)
BUN / CREAT RATIO: 10 (ref 10–24)
BUN: 13 mg/dL (ref 8–27)
CALCIUM: 10.1 mg/dL (ref 8.6–10.2)
CREATININE: 1.36 mg/dL — AB (ref 0.76–1.27)
Chloride: 100 mmol/L (ref 96–106)
GFR calc Af Amer: 56 mL/min/{1.73_m2} — ABNORMAL LOW (ref >59)
GFR, EST NON AFRICAN AMERICAN: 49 mL/min/{1.73_m2} — AB (ref >59)
GLUCOSE: 105 mg/dL — AB (ref 65–99)
Globulin, Total: 2.8 g/dL (ref 1.5–4.5)
Potassium: 4.4 mmol/L (ref 3.5–5.2)
SODIUM: 142 mmol/L (ref 134–144)
TOTAL PROTEIN: 7.1 g/dL (ref 6.0–8.5)

## 2017-06-21 LAB — 25-HYDROXYVITAMIN D LCMS D2+D3
25-HYDROXY, VITAMIN D-3: 20 ng/mL
25-HYDROXY, VITAMIN D: 20 ng/mL — AB

## 2017-06-21 LAB — MAGNESIUM: Magnesium: 2 mg/dL (ref 1.6–2.3)

## 2017-06-21 LAB — C-REACTIVE PROTEIN: CRP: 2.8 mg/L (ref 0.0–4.9)

## 2017-06-21 LAB — VITAMIN B12: Vitamin B-12: 238 pg/mL (ref 232–1245)

## 2017-06-21 LAB — SEDIMENTATION RATE: Sed Rate: 13 mm/hr (ref 0–30)

## 2017-06-27 ENCOUNTER — Other Ambulatory Visit: Payer: Self-pay | Admitting: Internal Medicine

## 2017-06-28 DIAGNOSIS — M48061 Spinal stenosis, lumbar region without neurogenic claudication: Secondary | ICD-10-CM | POA: Insufficient documentation

## 2017-06-28 DIAGNOSIS — M899 Disorder of bone, unspecified: Secondary | ICD-10-CM | POA: Insufficient documentation

## 2017-06-28 DIAGNOSIS — M47816 Spondylosis without myelopathy or radiculopathy, lumbar region: Secondary | ICD-10-CM | POA: Insufficient documentation

## 2017-06-28 DIAGNOSIS — M5416 Radiculopathy, lumbar region: Secondary | ICD-10-CM | POA: Insufficient documentation

## 2017-06-28 DIAGNOSIS — M4310 Spondylolisthesis, site unspecified: Secondary | ICD-10-CM | POA: Insufficient documentation

## 2017-06-28 DIAGNOSIS — Z789 Other specified health status: Secondary | ICD-10-CM | POA: Insufficient documentation

## 2017-06-28 DIAGNOSIS — M4300 Spondylolysis, site unspecified: Secondary | ICD-10-CM | POA: Insufficient documentation

## 2017-06-28 DIAGNOSIS — F1190 Opioid use, unspecified, uncomplicated: Secondary | ICD-10-CM | POA: Insufficient documentation

## 2017-06-28 NOTE — Progress Notes (Signed)
Patient's Name: Hector [name]  MRN: [id number]  Referring Provider: Einar Pheasant, MD  DOB: Jul 24, 1935  PCP: Einar Pheasant, MD  DOS: 06/29/2017  Note by: Gaspar Cola, MD  Service setting: Ambulatory outpatient  Specialty: Interventional Pain Management  Location: [facility] (AMB) Pain Management Facility    Patient type: Established   Primary Reason(s) for Visit: Encounter for evaluation before starting new chronic pain management plan of care (Level of risk: moderate) CC: Back Pain (lower)  HPI  Hector [name] is a 82 y.o. year old, male patient, who comes today for a follow-up evaluation to review the test results and decide on a treatment plan. He has Hypertension; Hypercholesterolemia; Sleep apnea; History of prostate cancer; Malignant melanoma ([place]); History of colonic polyps; Barrett's esophagus; BMI 35.0-35.9,adult; Pharmacologic therapy; URI (upper respiratory infection); Shoulder pain, left; Left hip pain; Chronic lower back pain (Primary Area of Pain) (Left); Chronic pain syndrome; Other long term (current) drug therapy; Other reduced mobility; Other specified health status; Polyarthritis, unspecified (Secondary Area of Pain); Prostate cancer ([place]); L5-S1 bilateral pars defect with spondylolisthesis; Lumbosacral Grade 1 Anterolisthesis of L5 over S1; Lumbar facet arthropathy (Bilateral); Lumbar foraminal stenosis (L5-S1) (Bilateral) (L>R); Lumbar radiculitis (L5) (Left); Disorder of skeletal system; Problems influencing health status; Opiate use; Chronic musculoskeletal pain; and Vitamin D deficiency on their problem list. His primarily concern today is the Back Pain (lower)  Pain Assessment: Location: Lower Back Radiating: butocks bilateral, not radiating down legs today Onset: More than a month ago Duration: Chronic pain Quality: Dull, Aching, Discomfort Severity: 3 /10 (self-reported pain score)  Note: Reported level is compatible with observation.                         When using our  objective Pain Scale, levels between 6 and 10/10 are said to belong in an emergency room, as it progressively worsens from a 6/10, described as severely limiting, requiring emergency care not usually available at an outpatient pain management facility. At a 6/10 level, communication becomes difficult and requires great effort. Assistance to reach the emergency department may be required. Facial flushing and profuse sweating along with potentially dangerous increases in heart rate and blood pressure will be evident. Effect on ADL: prolonged standing, bending over Timing: Constant Modifying factors: rest,injection, sitting  Hector [name] comes in today for a follow-up visit after his initial evaluation on 06/13/2017. Today we went over the results of his tests. These were explained in "Layman's terms". During today's appointment we went over my diagnostic impression, as well as the proposed treatment plan.  According to the patient's primary area of pain is in his lower back. He admits that this on the left side (L>R). He denies any pain radiating down his extremities. He denies any previous surgery. He is currently a patient at [facility], he has received lumbar facet injection October 2018 which is effective. He admits that he has had epidural steroid injection in March. He denies any previous physical therapy. He has had a recent MRI.  He admits that he has other areas of stiffness however he is not interested in any treatment for these areas.  In considering the treatment plan options, Mr. Camplin was reminded that I no longer take patients for medication management only. I asked him to let me know if he had no intention of taking advantage of the interventional therapies, so that we could make arrangements to provide this space to someone interested. I also made  it clear that undergoing interventional therapies for the purpose of getting pain medications is very inappropriate on the part of a  patient, and it will not be tolerated in this practice. This type of behavior would suggest true addiction and therefore it requires referral to an addiction specialist.   Further details on both, my assessment(s), as well as the proposed treatment plan, please see below.  Controlled Substance Pharmacotherapy Assessment REMS (Risk Evaluation and Mitigation Strategy)  Analgesic: Tramadol 50 mg twice daily (fill date 05/23/2017) tramadol 100 mg daily Highest recorded MME/day: 22.5 mg/day MME/day: 10 mg/day Pill Count: None expected due to no prior prescriptions written by our practice. Ignatius Specking, RN  06/29/2017  9:17 AM  Sign at close encounter Safety precautions to be maintained throughout the outpatient stay will include: orient to surroundings, keep bed in low position, maintain call bell within reach at all times, provide assistance with transfer out of bed and ambulation.    Pharmacokinetics: Liberation and absorption (onset of action): WNL Distribution (time to peak effect): WNL Metabolism and excretion (duration of action): WNL         Pharmacodynamics: Desired effects: Analgesia: Mr. Haggar reports >50% benefit. Functional ability: Patient reports that medication allows him to accomplish basic ADLs Clinically meaningful improvement in function (CMIF): Sustained CMIF goals met Perceived effectiveness: Described as relatively effective, allowing for increase in activities of daily living (ADL) Undesirable effects: Side-effects or Adverse reactions: None reported Monitoring: [place] PMP: Online review of the past 26-monthperiod previously conducted. Not applicable at this point since we have not taken over the patient's medication management yet. List of other Serum/Urine Drug Screening Test(s):  No results found for: AMPHSCRSER, BARBSCRSER, BENZOSCRSER, COCAINSCRSER, COCAINSCRNUR, [place] ECollegeList of all UDS  test(s) done:  No results found for: TOXASSSELUR, SUMMARY Last UDS on record: No results found for: TOXASSSELUR, SUMMARY UDS interpretation: No unexpected findings.          Medication Assessment Form: Patient introduced to form today Treatment compliance: Treatment may start today if patient agrees with proposed plan. Evaluation of compliance is not applicable at this point Risk Assessment Profile: Aberrant behavior: See initial evaluations. None observed or detected today Comorbid factors increasing risk of overdose: See initial evaluation. No additional risks detected today Medical Psychology Evaluation: Please see scanned results in medical record. Opioid Risk Tool - 06/29/17 0915      Family History of Substance Abuse   Alcohol  Negative    Illegal Drugs  Negative    Rx Drugs  Negative      Personal History of Substance Abuse   Alcohol  Negative    Illegal Drugs  Negative    Rx Drugs  Negative      Total Score   Opioid Risk Tool Scoring  0    Opioid Risk Interpretation  Low Risk      ORT Scoring interpretation table:  Score <3 = Low Risk for SUD  Score between 4-7 = Moderate Risk for SUD  Score >8 = High Risk for Opioid Abuse   Risk Mitigation Strategies:  Patient opioid safety counseling: Completed today. Counseling provided to patient as per "Patient Counseling Document". Document signed by patient, attesting to counseling and understanding Patient-Prescriber Agreement (PPA): Obtained today.  Controlled substance notification to other providers: Written and sent today.  Pharmacologic Plan: Today we may be taking over the patient's pharmacological regimen. See below  Laboratory Chemistry  Inflammation Markers (CRP: Acute Phase) (ESR: Chronic Phase) Lab Results  Component Value Date   CRP 2.8 06/13/2017   ESRSEDRATE 13 06/13/2017                 Renal Function Markers Lab Results  Component Value Date   BUN 13 06/13/2017   CREATININE 1.36 (H)  06/13/2017   GFRAA 56 (L) 06/13/2017   GFRNONAA 49 (L) 06/13/2017                 Hepatic Function Markers Lab Results  Component Value Date   AST 29 06/13/2017   ALT 26 02/24/2017   ALBUMIN 4.3 06/13/2017   ALKPHOS 60 06/13/2017                 Electrolytes Lab Results  Component Value Date   NA 142 06/13/2017   K 4.4 06/13/2017   CL 100 06/13/2017   CALCIUM 10.1 06/13/2017   MG 2.0 06/13/2017                 Neuropathy Markers Lab Results  Component Value Date   VITAMINB12 238 06/13/2017                 Bone Pathology Markers Lab Results  Component Value Date   ALKPHOS 60 06/13/2017   25OHVITD1 20 (L) 06/13/2017   25OHVITD2 <1.0 06/13/2017   25OHVITD3 20 06/13/2017   CALCIUM 10.1 06/13/2017                 Rheumatology Markers No results found for: LABURIC, URICUR              Coagulation Parameters Lab Results  Component Value Date   PLT 247.0 11/25/2016                 Cardiovascular Markers Lab Results  Component Value Date   HGB 15.7 11/25/2016   HCT 46.4 11/25/2016                 CA Markers No results found for: CEA, CA125, LABCA2               Note: Lab results reviewed.  Recent Diagnostic Imaging Review  Lumbosacral Imaging: Lumbar MR wo contrast:  Results for orders placed during the hospital encounter of 10/19/16  MR LUMBAR SPINE WO CONTRAST   Narrative CLINICAL DATA:  Low back pain with left leg pain. History of prostate cancer and melanoma.  EXAM: MRI LUMBAR SPINE WITHOUT CONTRAST  TECHNIQUE: Multiplanar, multisequence MR imaging of the lumbar spine was performed. No intravenous contrast was administered.  COMPARISON:  Lumbar radiographs 09/07/2016.  FINDINGS: Segmentation:  Normal.  Lowest disc space L5-S1.  Alignment: 6 mm anterolisthesis L5-S1 due to bilateral pars defects of L5. Remaining alignment normal.  Vertebrae:  Negative for fracture or mass lesion.  Conus medullaris: Extends to the L1 level and appears  normal.  Paraspinal and other soft tissues: Negative  Disc levels:  L1-2:  Negative  L2-3:  Mild disc degeneration without stenosis  L3-4: Mild disc and mild facet degeneration without stenosis. Mild endplate spurring  Z1-6: Mild disc and facet degeneration without stenosis. Mild endplate spurring  R6-V8: Bilateral pars defects of L5 with grade 1 anterolisthesis. Moderate right foraminal encroachment and marked left foraminal encroachment. Compression of the left L5 nerve root.  IMPRESSION: Grade 1 anterolisthesis L5-S1 due to bilateral pars defects. Marked left foraminal encroachment and moderate right foraminal encroachment at L5-S1.   Electronically  Signed   By: Franchot Gallo M.D.   On: 10/19/2016 10:11    Lumbar DG (Complete) 4+V:  Results for orders placed during the hospital encounter of 09/07/16  DG Lumbar Spine Complete   Narrative CLINICAL DATA:  Low back pain since November 2017.  EXAM: LUMBAR SPINE - COMPLETE 4+ VIEW  COMPARISON:  CT scan 10/03/2006  FINDINGS: There are bilateral pars defects at L5 with a grade 1 spondylolisthesis and associated degenerative disc disease and facet disease at L5-S1. The other lumbar vertebral bodies are normally aligned. Disc spaces are maintained. Small marginal osteophytes. The visualized bony pelvis is intact. The SI joints appear normal.  IMPRESSION: Bilateral pars defects at L5 with a grade 1 spondylolisthesis and associated degenerative disc disease and facet disease at L5-S1.  No acute bony findings.   Electronically Signed   By: Marijo Sanes M.D.   On: 09/07/2016 13:52    Complexity Note: Imaging results reviewed. Results shared with Mr. Allegretto, using Layman's terms.                         Meds   Current Outpatient Medications:  .  acetaminophen (TYLENOL) 500 MG tablet, Take 500 mg by mouth every 6 (six) hours as needed., Disp: , Rfl:  .  amLODipine (NORVASC) 5 MG tablet, TAKE 1 TABLET BY MOUTH  EVERY DAY, Disp: 30 tablet, Rfl: 5 .  aspirin 81 MG tablet, Take 81 mg by mouth daily., Disp: , Rfl:  .  Casanthranol-Docusate Sodium 30-100 MG CAPS, Take by mouth., Disp: , Rfl:  .  fluticasone (FLONASE) 50 MCG/ACT nasal spray, TWO PUFFS IN EACH NOSTRIL ONCE A DAY, Disp: 16 g, Rfl: 4 .  hydrochlorothiazide (HYDRODIURIL) 25 MG tablet, TAKE 1 TABLET BY MOUTH EVERY DAY, Disp: 30 tablet, Rfl: 5 .  hyoscyamine (LEVSIN, ANASPAZ) 0.125 MG tablet, Take 0.125 mg by mouth as needed., Disp: , Rfl:  .  irbesartan (AVAPRO) 300 MG tablet, TAKE 1 TABLET BY MOUTH EVERY DAY, Disp: 30 tablet, Rfl: 5 .  L-Lysine 500 MG TABS, Take by mouth daily., Disp: , Rfl:  .  levothyroxine (SYNTHROID, LEVOTHROID) 88 MCG tablet, TAKE 1 TABLET BY MOUTH EVERY DAY, Disp: 30 tablet, Rfl: 3 .  omeprazole (PRILOSEC) 20 MG capsule, TAKE ONE CAPSULE BY MOUTH TWICE A DAY, Disp: 60 capsule, Rfl: 3 .  valACYclovir (VALTREX) 1000 MG tablet, TAKE 1 TABLET BY MOUTH EVERY DAY., Disp: 30 tablet, Rfl: 3 .  Calcium Carb-Cholecalciferol (CALCIUM PLUS D3 ABSORBABLE) [phone] MG-UNIT CAPS, Take 1 capsule by mouth daily with breakfast., Disp: 90 capsule, Rfl: 0 .  cyclobenzaprine (FLEXERIL) 5 MG tablet, Take 1 tablet (5 mg total) by mouth at bedtime., Disp: 30 tablet, Rfl: 2 .  [START ON 06/30/2017] ergocalciferol (VITAMIN D2) 50000 units capsule, Take 1 capsule (50,000 Units total) by mouth 2 (two) times a week. X 6 weeks., Disp: 12 capsule, Rfl: 0 .  Magnesium Oxide 500 MG CAPS, Take 1 capsule (500 mg total) by mouth 2 (two) times daily at 8 am and 10 pm., Disp: 90 capsule, Rfl: 0 .  traMADol (ULTRAM) 50 MG tablet, Take 1 tablet (50 mg total) by mouth every 8 (eight) hours as needed for severe pain., Disp: 90 tablet, Rfl: 2  ROS  Constitutional: Denies any fever or chills Gastrointestinal: No reported hemesis, hematochezia, vomiting, or acute GI distress Musculoskeletal: Denies any acute onset joint swelling, redness, loss of ROM, or  weakness Neurological: No reported episodes of  acute onset apraxia, aphasia, dysarthria, agnosia, amnesia, paralysis, loss of coordination, or loss of consciousness  Allergies  Mr. Hanshaw is allergic to tape.  [place]  Drug: Mr. Fancher  reports that he does not use drugs. Alcohol:  reports that he does not drink alcohol. Tobacco:  reports that  has never smoked. he has never used smokeless tobacco. Medical:  has a past medical history of Anemia, Arthritis, Cancer ([place]), Chicken pox, Diverticulitis, Dysrhythmia, GERD (gastroesophageal reflux disease), Hyperlipidemia, Hypertension, Hypothyroidism, Melanoma ([place]), Sleep apnea, and Ulcer. Surgical: Mr. Barbier  has a past surgical history that includes Prostate surgery; cystocopy (2003); Hemorrhoid surgery; Cardiac catheterization; sleep study; Colon surgery (2006-2008-2011); Cataract Surgery (Right, 02/13/14); and Esophagogastroduodenoscopy (egd) with propofol (N/A, 08/04/2016). Family: family history includes Heart disease in his father; Kidney disease in his father; Liver disease in his mother.  Constitutional Exam  General appearance: Well nourished, well developed, and well hydrated. In no apparent acute distress Vitals:   06/29/17 0905  BP: (!) 145/71  Pulse: (!) 57  Resp: 16  Temp: 97.7 F (36.5 C)  SpO2: 99%  Weight: 240 lb (108.9 kg)  Height: '5\' 8"'$  (1.727 m)   BMI Assessment: Estimated body mass index is 36.49 kg/m as calculated from the following:   Height as of this encounter: '5\' 8"'$  (1.727 m).   Weight as of this encounter: 240 lb (108.9 kg).  BMI interpretation table: BMI level Category Range association with higher incidence of chronic pain  <18 kg/m2 Underweight   18.5-24.9 kg/m2 Ideal body weight   25-29.9 kg/m2 Overweight Increased incidence by 20%  30-34.9 kg/m2 Obese (Class I) Increased incidence by 68%  35-39.9 kg/m2 Severe obesity (Class II) Increased incidence by 136%  >40 kg/m2 Extreme obesity (Class III) Increased  incidence by 254%   BMI Readings from Last 4 Encounters:  06/29/17 36.49 kg/m  06/13/17 36.49 kg/m  05/24/17 36.61 kg/m  03/03/17 35.82 kg/m   Wt Readings from Last 4 Encounters:  06/29/17 240 lb (108.9 kg)  06/13/17 240 lb (108.9 kg)  05/24/17 240 lb 12.8 oz (109.2 kg)  03/03/17 235 lb 9.6 oz (106.9 kg)  Psych/Mental status: Alert, oriented x 3 (person, place, & time)       Eyes: PERLA Respiratory: No evidence of acute respiratory distress  Cervical Spine Area Exam  Skin & Axial Inspection: No masses, redness, edema, swelling, or associated skin lesions Alignment: Symmetrical Functional ROM: Unrestricted ROM      Stability: No instability detected Muscle Tone/Strength: Functionally intact. No obvious neuro-muscular anomalies detected. Sensory (Neurological): Unimpaired Palpation: No palpable anomalies              Upper Extremity (UE) Exam    Side: Right upper extremity  Side: Left upper extremity  Skin & Extremity Inspection: Skin color, temperature, and hair growth are WNL. No peripheral edema or cyanosis. No masses, redness, swelling, asymmetry, or associated skin lesions. No contractures.  Skin & Extremity Inspection: Skin color, temperature, and hair growth are WNL. No peripheral edema or cyanosis. No masses, redness, swelling, asymmetry, or associated skin lesions. No contractures.  Functional ROM: Unrestricted ROM          Functional ROM: Unrestricted ROM          Muscle Tone/Strength: Functionally intact. No obvious neuro-muscular anomalies detected.  Muscle Tone/Strength: Functionally intact. No obvious neuro-muscular anomalies detected.  Sensory (Neurological): Unimpaired          Sensory (Neurological): Unimpaired          Palpation:  No palpable anomalies              Palpation: No palpable anomalies              Specialized Test(s): Deferred         Specialized Test(s): Deferred          Thoracic Spine Area Exam  Skin & Axial Inspection: No masses, redness, or  swelling Alignment: Symmetrical Functional ROM: Unrestricted ROM Stability: No instability detected Muscle Tone/Strength: Functionally intact. No obvious neuro-muscular anomalies detected. Sensory (Neurological): Unimpaired Muscle strength & Tone: No palpable anomalies  Lumbar Spine Area Exam  Skin & Axial Inspection: No masses, redness, or swelling Alignment: Symmetrical Functional ROM: Decreased ROM      Stability: No instability detected Muscle Tone/Strength: Functionally intact. No obvious neuro-muscular anomalies detected. Sensory (Neurological): Movement-associated pain Palpation: Complains of area being tender to palpation       Provocative Tests: Lumbar Hyperextension and rotation test: Positive bilaterally for facet joint pain. Lumbar Lateral bending test: evaluation deferred today       Patrick's Maneuver: evaluation deferred today                    Gait & Posture Assessment  Ambulation: Unassisted Gait: Antalgic Posture: Difficulty standing up straight, due to pain   Lower Extremity Exam    Side: Right lower extremity  Side: Left lower extremity  Skin & Extremity Inspection: Skin color, temperature, and hair growth are WNL. No peripheral edema or cyanosis. No masses, redness, swelling, asymmetry, or associated skin lesions. No contractures.  Skin & Extremity Inspection: Skin color, temperature, and hair growth are WNL. No peripheral edema or cyanosis. No masses, redness, swelling, asymmetry, or associated skin lesions. No contractures.  Functional ROM: Unrestricted ROM          Functional ROM: Unrestricted ROM          Muscle Tone/Strength: Functionally intact. No obvious neuro-muscular anomalies detected.  Muscle Tone/Strength: Functionally intact. No obvious neuro-muscular anomalies detected.  Sensory (Neurological): Unimpaired  Sensory (Neurological): Unimpaired  Palpation: No palpable anomalies  Palpation: No palpable anomalies   Assessment & Plan  Primary Diagnosis  & Pertinent Problem List: The primary encounter diagnosis was Chronic lower back pain (Primary Area of Pain) (Left). Diagnoses of L5-S1 bilateral pars defect with spondylolisthesis, Lumbosacral Grade 1 Anterolisthesis of L5 over S1, Lumbar facet arthropathy (Bilateral), Lumbar foraminal stenosis (L5-S1) (Bilateral), Lumbar radiculitis (L5) (Left), Chronic pain syndrome, Disorder of skeletal system, Problems influencing health status, Pharmacologic therapy, Opiate use, Chronic musculoskeletal pain, and Vitamin D deficiency were also pertinent to this visit.  Visit Diagnosis: 1. Chronic lower back pain (Primary Area of Pain) (Left)   2. L5-S1 bilateral pars defect with spondylolisthesis   3. Lumbosacral Grade 1 Anterolisthesis of L5 over S1   4. Lumbar facet arthropathy (Bilateral)   5. Lumbar foraminal stenosis (L5-S1) (Bilateral)   6. Lumbar radiculitis (L5) (Left)   7. Chronic pain syndrome   8. Disorder of skeletal system   9. Problems influencing health status   10. Pharmacologic therapy   11. Opiate use   12. Chronic musculoskeletal pain   13. Vitamin D deficiency    Problems updated and reviewed during this visit: Problem  Chronic Musculoskeletal Pain  Vitamin D Deficiency   Time Note: Greater than 50% of the 40 minute(s) of face-to-face time spent with Mr. Joles, was spent in counseling/coordination of care regarding: Mr. Tegeler's primary cause of pain, the results of his recent test(s),  the significance of each one oth the test(s) anomalies and it's corresponding characteristic pain pattern(s), the treatment plan, treatment alternatives, the risks and possible complications of proposed treatment, medication side effects, the opioid analgesic risks and possible complications, the appropriate use of his medications, realistic expectations, the goals of pain management (increased in functionality), the need to bring and keep the BMI below 30, the medication agreement and the need to collect  and read the AVS material.  Plan of Care  Pharmacotherapy (Medications Ordered): Meds ordered this encounter  Medications  . traMADol (ULTRAM) 50 MG tablet    Sig: Take 1 tablet (50 mg total) by mouth every 8 (eight) hours as needed for severe pain.    Dispense:  90 tablet    Refill:  2    Do not place this medication, or any other prescription from our practice, on "Automatic Refill". Patient may have prescription filled one day early if pharmacy is closed on scheduled refill date. Do not fill until: 06/29/17 To last until: 09/27/17  . cyclobenzaprine (FLEXERIL) 5 MG tablet    Sig: Take 1 tablet (5 mg total) by mouth at bedtime.    Dispense:  30 tablet    Refill:  2    Do not place this medication, or any other prescription from our practice, on "Automatic Refill". Patient may have prescription filled one day early if pharmacy is closed on scheduled refill date.  . Calcium Carb-Cholecalciferol (CALCIUM PLUS D3 ABSORBABLE) [phone] MG-UNIT CAPS    Sig: Take 1 capsule by mouth daily with breakfast.    Dispense:  90 capsule    Refill:  0    Do not place medication on "Automatic Refill". Fill one day early if pharmacy is closed on scheduled refill date.  . ergocalciferol (VITAMIN D2) 50000 units capsule    Sig: Take 1 capsule (50,000 Units total) by mouth 2 (two) times a week. X 6 weeks.    Dispense:  12 capsule    Refill:  0    Do not add this medication to the electronic "Automatic Refill" notification system. Patient may have prescription filled one day early if pharmacy is closed on scheduled refill date.  . Magnesium Oxide 500 MG CAPS    Sig: Take 1 capsule (500 mg total) by mouth 2 (two) times daily at 8 am and 10 pm.    Dispense:  90 capsule    Refill:  0    Do not place medication on "Automatic Refill". Fill one day early if pharmacy is closed on scheduled refill date.    Procedure Orders     LUMBAR FACET(MEDIAL BRANCH NERVE BLOCK) MBNB     LUMBAR FACET(MEDIAL BRANCH NERVE  BLOCK) MBNB  Lab Orders     Compliance Drug Analysis, Ur  Imaging Orders     DG Lumbar Spine Complete W/Bend Referral Orders  No referral(s) requested today    Pharmacological management options:  Opioid Analgesics: We'll take over management today. See above orders Membrane stabilizer: We have discussed the possibility of optimizing this mode of therapy, if tolerated Muscle relaxant: We have discussed the possibility of a trial NSAID: We have discussed the possibility of a trial Other analgesic(s): To be determined at a later time   Interventional management options: Planned, scheduled, and/or pending:    X-rays of the lumbar spine on flexion and extension to evaluate the anterolisthesis of L5 over S1  Diagnostic bilateral sided lumbar facet block    Considering:   Diagnostic left L5-S1 transforaminal  epidural steroid injection  Possible left L5 nerve root ganglion RFA  Diagnostic left sided lumbar facet block  Possible left-sided lumbar facet RFA  Diagnostic left sided L4-5 interlaminar lumbar epidural steroid injection    PRN Procedures:   None at this time   Provider-requested follow-up: Return for Procedure (w/ sedation): (B) L-FCT.  Future Appointments  Date Time Provider Edmund  07/14/2017  8:00 AM Milinda Pointer, MD [facility] None  07/15/2017  8:15 AM [facility]  07/19/2017  8:00 AM Einar Pheasant, MD [facility]  05/25/2018  9:00 AM O'Brien-Blaney, Bryson Corona, LPN [facility]    Primary Care Physician: Einar Pheasant, MD Location: [facility] Note by: Gaspar Cola, MD Date: 06/29/2017; Time: 3:23 PM

## 2017-06-29 ENCOUNTER — Encounter: Payer: Self-pay | Admitting: Pain Medicine

## 2017-06-29 ENCOUNTER — Ambulatory Visit
Admission: RE | Admit: 2017-06-29 | Discharge: 2017-06-29 | Disposition: A | Discharge status: Home / Self Care | Payer: Medicare Other | Source: Ambulatory Visit | Attending: Pain Medicine | Admitting: Pain Medicine

## 2017-06-29 ENCOUNTER — Other Ambulatory Visit: Payer: Self-pay

## 2017-06-29 ENCOUNTER — Ambulatory Visit: Payer: Medicare Other | Attending: Pain Medicine | Admitting: Pain Medicine

## 2017-06-29 VITALS — BP 145/71 | HR 57 | Temp 97.7°F | Resp 16 | Ht 68.0 in | Wt 240.0 lb

## 2017-06-29 DIAGNOSIS — M4316 Spondylolisthesis, lumbar region: Secondary | ICD-10-CM | POA: Insufficient documentation

## 2017-06-29 DIAGNOSIS — G894 Chronic pain syndrome: Secondary | ICD-10-CM | POA: Insufficient documentation

## 2017-06-29 DIAGNOSIS — M4300 Spondylolysis, site unspecified: Secondary | ICD-10-CM

## 2017-06-29 DIAGNOSIS — Z8719 Personal history of other diseases of the digestive system: Secondary | ICD-10-CM | POA: Insufficient documentation

## 2017-06-29 DIAGNOSIS — M9983 Other biomechanical lesions of lumbar region: Secondary | ICD-10-CM

## 2017-06-29 DIAGNOSIS — Z9109 Other allergy status, other than to drugs and biological substances: Secondary | ICD-10-CM | POA: Insufficient documentation

## 2017-06-29 DIAGNOSIS — Z8619 Personal history of other infectious and parasitic diseases: Secondary | ICD-10-CM | POA: Insufficient documentation

## 2017-06-29 DIAGNOSIS — M48061 Spinal stenosis, lumbar region without neurogenic claudication: Secondary | ICD-10-CM | POA: Diagnosis not present

## 2017-06-29 DIAGNOSIS — M5136 Other intervertebral disc degeneration, lumbar region: Secondary | ICD-10-CM | POA: Insufficient documentation

## 2017-06-29 DIAGNOSIS — G4730 Sleep apnea, unspecified: Secondary | ICD-10-CM | POA: Diagnosis not present

## 2017-06-29 DIAGNOSIS — G8929 Other chronic pain: Secondary | ICD-10-CM

## 2017-06-29 DIAGNOSIS — D649 Anemia, unspecified: Secondary | ICD-10-CM | POA: Diagnosis not present

## 2017-06-29 DIAGNOSIS — Z841 Family history of disorders of kidney and ureter: Secondary | ICD-10-CM | POA: Insufficient documentation

## 2017-06-29 DIAGNOSIS — Z9841 Cataract extraction status, right eye: Secondary | ICD-10-CM | POA: Insufficient documentation

## 2017-06-29 DIAGNOSIS — M7918 Myalgia, other site: Secondary | ICD-10-CM | POA: Insufficient documentation

## 2017-06-29 DIAGNOSIS — Z79899 Other long term (current) drug therapy: Secondary | ICD-10-CM | POA: Diagnosis not present

## 2017-06-29 DIAGNOSIS — M47816 Spondylosis without myelopathy or radiculopathy, lumbar region: Secondary | ICD-10-CM | POA: Insufficient documentation

## 2017-06-29 DIAGNOSIS — F1190 Opioid use, unspecified, uncomplicated: Secondary | ICD-10-CM | POA: Diagnosis not present

## 2017-06-29 DIAGNOSIS — E559 Vitamin D deficiency, unspecified: Secondary | ICD-10-CM | POA: Insufficient documentation

## 2017-06-29 DIAGNOSIS — I10 Essential (primary) hypertension: Secondary | ICD-10-CM | POA: Diagnosis not present

## 2017-06-29 DIAGNOSIS — M4310 Spondylolisthesis, site unspecified: Secondary | ICD-10-CM | POA: Diagnosis not present

## 2017-06-29 DIAGNOSIS — E039 Hypothyroidism, unspecified: Secondary | ICD-10-CM | POA: Diagnosis not present

## 2017-06-29 DIAGNOSIS — K219 Gastro-esophageal reflux disease without esophagitis: Secondary | ICD-10-CM | POA: Diagnosis not present

## 2017-06-29 DIAGNOSIS — M899 Disorder of bone, unspecified: Secondary | ICD-10-CM | POA: Diagnosis not present

## 2017-06-29 DIAGNOSIS — Z789 Other specified health status: Secondary | ICD-10-CM | POA: Diagnosis not present

## 2017-06-29 DIAGNOSIS — M5416 Radiculopathy, lumbar region: Secondary | ICD-10-CM

## 2017-06-29 DIAGNOSIS — M4807 Spinal stenosis, lumbosacral region: Secondary | ICD-10-CM | POA: Insufficient documentation

## 2017-06-29 DIAGNOSIS — Z79891 Long term (current) use of opiate analgesic: Secondary | ICD-10-CM | POA: Diagnosis not present

## 2017-06-29 DIAGNOSIS — Z859 Personal history of malignant neoplasm, unspecified: Secondary | ICD-10-CM | POA: Insufficient documentation

## 2017-06-29 DIAGNOSIS — Z7982 Long term (current) use of aspirin: Secondary | ICD-10-CM | POA: Insufficient documentation

## 2017-06-29 DIAGNOSIS — M5442 Lumbago with sciatica, left side: Secondary | ICD-10-CM

## 2017-06-29 DIAGNOSIS — Z85820 Personal history of malignant melanoma of skin: Secondary | ICD-10-CM | POA: Insufficient documentation

## 2017-06-29 DIAGNOSIS — Z9889 Other specified postprocedural states: Secondary | ICD-10-CM | POA: Insufficient documentation

## 2017-06-29 DIAGNOSIS — Z8249 Family history of ischemic heart disease and other diseases of the circulatory system: Secondary | ICD-10-CM | POA: Insufficient documentation

## 2017-06-29 DIAGNOSIS — Z8379 Family history of other diseases of the digestive system: Secondary | ICD-10-CM | POA: Insufficient documentation

## 2017-06-29 MED ORDER — CALCIUM PLUS D3 ABSORBABLE 600-2500 MG-UNIT PO CAPS: 1.0000 | ORAL_CAPSULE | Freq: Every day | ORAL | 0 refills | Status: DC

## 2017-06-29 MED ORDER — ERGOCALCIFEROL 1.25 MG (50000 UT) PO CAPS
50000.0000 [IU] | ORAL_CAPSULE | ORAL | 0 refills | Status: AC
Start: 2017-06-30 — End: 2017-08-11

## 2017-06-29 MED ORDER — MAGNESIUM OXIDE -MG SUPPLEMENT 500 MG PO CAPS: 1.0000 | ORAL_CAPSULE | Freq: Two times a day (BID) | ORAL | 0 refills | Status: AC

## 2017-06-29 MED ORDER — TRAMADOL HCL 50 MG PO TABS: 50.0000 mg | ORAL_TABLET | Freq: Three times a day (TID) | ORAL | 2 refills | Status: DC | PRN

## 2017-06-29 MED ORDER — CYCLOBENZAPRINE HCL 5 MG PO TABS: 5.0000 mg | ORAL_TABLET | Freq: Every day | ORAL | 2 refills | Status: DC

## 2017-06-29 NOTE — Patient Instructions (Addendum)
____________________________________________________________________________________________  Pain Prevention Technique  Definition:   A technique used to minimize the effects of an activity known to cause inflammation or swelling, which in turn leads to an increase in pain.  Purpose: To prevent swelling from occurring. It is based on the fact that it is easier to prevent swelling from happening than it is to get rid of it, once it occurs.  Contraindications: 1. Anyone with allergy or hypersensitivity to the recommended medications. 2. Anyone taking anticoagulants (Blood Thinners) (e.g., Coumadin, Warfarin, Plavix, etc.). 3. Patients in Renal Failure.  Technique: Before you undertake an activity known to cause pain, or a flare-up of your chronic pain, and before you experience any pain, do the following:  1. On a full stomach, take 4 (four) over the counter Ibuprofens 200mg  tablets (Motrin), for a total of 800 mg. 2. In addition, take over the counter Magnesium 400 to 500 mg, before doing the activity.  3. Six (6) hours later, again on a full stomach, repeat the Ibuprofen. 4. That night, take a warm shower and stretch under the running warm water.  This technique may be sufficient to abort the pain and discomfort before it happens. Keep in mind that it takes a lot less medication to prevent swelling than it takes to eliminate it once it occurs.  ____________________________________________________________________________________________  ____________________________________________________________________________________________  Preparing for Procedure with Sedation Instructions: . Oral Intake: Do not eat or drink anything for at least 8 hours prior to your procedure. . Transportation: Public transportation is not allowed. Bring an adult driver. The driver must be physically present in our waiting room before any procedure can be started. Marland Kitchen Physical Assistance: Bring an adult  physically capable of assisting you, in the event you need help. This adult should keep you company at home for at least 6 hours after the procedure. . Blood Pressure Medicine: Take your blood pressure medicine with a sip of water the morning of the procedure. . Blood thinners:  . Diabetics on insulin: Notify the staff so that you can be scheduled 1st case in the morning. If your diabetes requires high dose insulin, take only  of your normal insulin dose the morning of the procedure and notify the staff that you have done so. . Preventing infections: Shower with an antibacterial soap the morning of your procedure. . Build-up your immune system: Take 1000 mg of Vitamin C with every meal (3 times a day) the day prior to your procedure. Marland Kitchen Antibiotics: Inform the staff if you have a condition or reason that requires you to take antibiotics before dental procedures. . Pregnancy: If you are pregnant, call and cancel the procedure. . Sickness: If you have a cold, fever, or any active infections, call and cancel the procedure. . Arrival: You must be in the facility at least 30 minutes prior to your scheduled procedure. . Children: Do not bring children with you. . Dress appropriately: Bring dark clothing that you would not mind if they get stained. . Valuables: Do not bring any jewelry or valuables. Procedure appointments are reserved for interventional treatments only. Marland Kitchen No Prescription Refills. . No medication changes will be discussed during procedure appointments. . No disability issues will be discussed. ____________________________________________________________________________________________   ____________________________________________________________________________________________  Medication Rules  Applies to: All patients receiving prescriptions (written or electronic).  Pharmacy of record: Pharmacy where electronic prescriptions will be sent. If written prescriptions are taken to a  different pharmacy, please inform the nursing staff. The pharmacy listed in the electronic medical record  should be the one where you would like electronic prescriptions to be sent.  Prescription refills: Only during scheduled appointments. Applies to both, written and electronic prescriptions.  NOTE: The following applies primarily to controlled substances (Opioid* Pain Medications).   Patient's responsibilities: 1. Pain Pills: Bring all pain pills to every appointment (except for procedure appointments). 2. Pill Bottles: Bring pills in original pharmacy bottle. Always bring newest bottle. Bring bottle, even if empty. 3. Medication refills: You are responsible for knowing and keeping track of what medications you need refilled. The day before your appointment, write a list of all prescriptions that need to be refilled. Bring that list to your appointment and give it to the admitting nurse. Prescriptions will be written only during appointments. If you forget a medication, it will not be "Called in", "Faxed", or "electronically sent". You will need to get another appointment to get these prescribed. 4. Prescription Accuracy: You are responsible for carefully inspecting your prescriptions before leaving our office. Have the discharge nurse carefully go over each prescription with you, before taking them home. Make sure that your name is accurately spelled, that your address is correct. Check the name and dose of your medication to make sure it is accurate. Check the number of pills, and the written instructions to make sure they are clear and accurate. Make sure that you are given enough medication to last until your next medication refill appointment. 5. Taking Medication: Take medication as prescribed. Never take more pills than instructed. Never take medication more frequently than prescribed. Taking less pills or less frequently is permitted and encouraged, when it comes to controlled substances  (written prescriptions).  6. Inform other Doctors: Always inform, all of your healthcare providers, of all the medications you take. 7. Pain Medication from other Providers: You are not allowed to accept any additional pain medication from any other Doctor or Healthcare provider. There are two exceptions to this rule. (see below) In the event that you require additional pain medication, you are responsible for notifying us, as stated below. 8. Medication Agreement: You are responsible for carefully reading and following our Medication Agreement. This must be signed before receiving any prescriptions from our practice. Safely store a copy of your signed Agreement. Violations to the Agreement will result in no further prescriptions. (Additional copies of our Medication Agreement are available upon request.) 9. Laws, Rules, & Regulations: All patients are expected to follow all Federal and [employer], [employer], Rules, [employer] [facility]. Ignorance of the Laws does not constitute a valid excuse. The use of any illegal substances is prohibited. 10. Adopted CDC guidelines & recommendations: Target dosing levels will be at or below 60 MME/day. Use of benzodiazepines** is not recommended.  Exceptions: There are only two exceptions to the rule of not receiving pain medications from other Healthcare Providers. 1. Exception #1 (Emergencies): In the event of an emergency (i.e.: accident requiring emergency care), you are allowed to receive additional pain medication. However, you are responsible for: As soon as you are able, call our office [phone], at any time of the day or night, and leave a message stating your name, the date and nature of the emergency, and the name and dose of the medication prescribed. In the event that your call is answered by a member of our staff, make sure to document and save the date, time, and the name of the person that took your information.  2. Exception #2 (Planned Surgery): In the  event that you are scheduled  by another doctor or dentist to have any type of surgery or procedure, you are allowed (for a period no longer than 30 days), to receive additional pain medication, for the acute post-op pain. However, in this case, you are responsible for picking up a copy of our "Post-op Pain Management for Surgeons" handout, and giving it to your surgeon or dentist. This document is available at our office, and does not require an appointment to obtain it. Simply go to our office during business hours (Monday-Thursday from 8:00 AM to 4:00 PM) (Friday 8:00 AM to 12:00 Noon) or if you have a scheduled appointment with [place], prior to your surgery, and ask for it by name. In addition, you will need to provide [place] with your name, name of your surgeon, type of surgery, and date of procedure or surgery.  *Opioid medications include: morphine, codeine, oxycodone, oxymorphone, hydrocodone, hydromorphone, meperidine, tramadol, tapentadol, buprenorphine, fentanyl, methadone. **Benzodiazepine medications include: diazepam (Valium), alprazolam (Xanax), clonazepam (Klonopine), lorazepam (Ativan), clorazepate (Tranxene), chlordiazepoxide (Librium), estazolam (Prosom), oxazepam (Serax), temazepam (Restoril), triazolam (Halcion)  ____________________________________________________________________________________________ ____________________________________________________________________________________________  Pain Management Weight Control Diet   Note: Before starting this diet, make sure to talk to your primary care physician to make sure it is safe for you.   Breakfast:   1 boiled or pouched egg. You may use the egg white ready-made preparations.  1 wheat low calorie toast.   8 oz. black coffee with stevia (maximum of 2 packs). (No milk or creamer, and no other type of sweetener but stevia).   Lunch & Dinner:   5 oz. Lean Protein like chicken, fish, or lean meat (above 95% fat free).   No  pork or any type of cold cuts or processed meats.   Steamed, backed or grilled, but not fried.   No oils, fats, or butter.   1 cup of steamed vegetables, or 1.5 cups if raw.   1 serving of salad, but no dressings, except vinegar or lemon juice.   Mid-day & Mid-afternoon snack:   1 fruit. No bananas. (total of 2 fruits per day)   Important Rules:  1. Must drink 100 oz. or more of water per day.   Consult your Primary Care Physician if you have a history of kidney failure, or congestive heart failure before doing this.  2. Take calcium and magnesium every day to avoid night cramps.   Consult your Primary Care Physician if you have a history of kidney failure, hypercalcemia, or parathyroid problems before doing this.   Over-the-counter calcium 600 to 1200 mg per day (in the morning). Take with Vitamin D 2000 IU every day.   Over-the-counter magnesium 400 to 500 mg per day (1-2 hours prior to bedtime).  3. Control salt intake. (You can use it but in moderation.)  4. Avoid eating anything after 6:00 pm.  5. Weight yourself every morning at the same time and record weight on a notebook.  6. Mix "Benefiber" 3 to 5 table spoons in water and drink before meals.  7. No sodas.  8. No alcohol.  9. No sugar.  10. No artificial sweeteners.   Stevia without sugar is the only sweetener aloud.  11. No bread except for the one breakfast toast.   Low calorie wheat bread.  12. Duration of diet: 2 weeks at a time with 2 days' rest, then repeat, until BMI is less than 30.  13. Your current Body mass index is 36.49 kg/m. 14. Do not over-eat or over-indulge yourself in the  2 days of rest from the diet.  ____________________________________________________________________________________________  GENERAL RISKS AND COMPLICATIONS  What are the risk, side effects and possible complications? Generally speaking, most procedures are safe.  However, with any procedure there are risks, side effects,  and the possibility of complications.  The risks and complications are dependent upon the sites that are lesioned, or the type of nerve block to be performed.  The closer the procedure is to the spine, the more serious the risks are.  Great care is taken when placing the radio frequency needles, block needles or lesioning probes, but sometimes complications can occur. 1. Infection: Any time there is an injection through the skin, there is a risk of infection.  This is why sterile conditions are used for these blocks.  There are four possible types of infection. 1. Localized skin infection. 2. Central Nervous System Infection-This can be in the form of Meningitis, which can be deadly. 3. Epidural Infections-This can be in the form of an epidural abscess, which can cause pressure inside of the spine, causing compression of the spinal cord with subsequent paralysis. This would require an emergency surgery to decompress, and there are no guarantees that the patient would recover from the paralysis. 4. Discitis-This is an infection of the intervertebral discs.  It occurs in about 1% of discography procedures.  It is difficult to treat and it may lead to surgery.        2. Pain: the needles have to go through skin and soft tissues, will cause soreness.       3. Damage to internal structures:  The nerves to be lesioned may be near blood vessels or    other nerves which can be potentially damaged.       4. Bleeding: Bleeding is more common if the patient is taking blood thinners such as  aspirin, Coumadin, Ticiid, Plavix, etc., or if he/she have some genetic predisposition  such as hemophilia. Bleeding into the spinal canal can cause compression of the spinal  cord with subsequent paralysis.  This would require an emergency surgery to  decompress and there are no guarantees that the patient would recover from the  paralysis.       5. Pneumothorax:  Puncturing of a lung is a possibility, every time a needle is  introduced in  the area of the chest or upper back.  Pneumothorax refers to free air around the  collapsed lung(s), inside of the thoracic cavity (chest cavity).  Another two possible  complications related to a similar event would include: Hemothorax and Chylothorax.   These are variations of the Pneumothorax, where instead of air around the collapsed  lung(s), you may have blood or chyle, respectively.       6. Spinal headaches: They may occur with any procedures in the area of the spine.       7. Persistent CSF (Cerebro-Spinal Fluid) leakage: This is a rare problem, but may occur  with prolonged intrathecal or epidural catheters either due to the formation of a fistulous  track or a dural tear.       8. Nerve damage: By working so close to the spinal cord, there is always a possibility of  nerve damage, which could be as serious as a permanent spinal cord injury with  paralysis.       9. Death:  Although rare, severe deadly allergic reactions known as "Anaphylactic  reaction" can occur to any of the medications used.      10.  Worsening of the symptoms:  We can always make thing worse.  What are the chances of something like this happening? Chances of any of this occuring are extremely low.  By statistics, you have more of a chance of getting killed in a motor vehicle accident: while driving to the hospital than any of the above occurring .  Nevertheless, you should be aware that they are possibilities.  In general, it is similar to taking a shower.  Everybody knows that you can slip, hit your head and get killed.  Does that mean that you should not shower again?  Nevertheless always keep in mind that statistics do not mean anything if you happen to be on the wrong side of them.  Even if a procedure has a 1 (one) in a 1,000,000 (million) chance of going wrong, it you happen to be that one..Also, keep in mind that by statistics, you have more of a chance of having something go wrong when taking  medications.  Who should not have this procedure? If you are on a blood thinning medication (e.g. Coumadin, Plavix, see list of "Blood Thinners"), or if you have an active infection going on, you should not have the procedure.  If you are taking any blood thinners, please inform your physician.  How should I prepare for this procedure?  Do not eat or drink anything at least six hours prior to the procedure.  Bring a driver with you .  It cannot be a taxi.  Come accompanied by an adult that can drive you back, and that is strong enough to help you if your legs get weak or numb from the local anesthetic.  Take all of your medicines the morning of the procedure with just enough water to swallow them.  If you have diabetes, make sure that you are scheduled to have your procedure done first thing in the morning, whenever possible.  If you have diabetes, take only half of your insulin dose and notify our nurse that you have done so as soon as you arrive at the clinic.  If you are diabetic, but only take blood sugar pills (oral hypoglycemic), then do not take them on the morning of your procedure.  You may take them after you have had the procedure.  Do not take aspirin or any aspirin-containing medications, at least eleven (11) days prior to the procedure.  They may prolong bleeding.  Wear loose fitting clothing that may be easy to take off and that you would not mind if it got stained with Betadine or blood.  Do not wear any jewelry or perfume  Remove any nail coloring.  It will interfere with some of our monitoring equipment.  NOTE: Remember that this is not meant to be interpreted as a complete list of all possible complications.  Unforeseen problems may occur.  BLOOD THINNERS The following drugs contain aspirin or other products, which can cause increased bleeding during surgery and should not be taken for 2 weeks prior to and 1 week after surgery.  If you should need take something for  relief of minor pain, you may take acetaminophen which is found in Tylenol,m Datril, Anacin-3 and Panadol. It is not blood thinner. The products listed below are.  Do not take any of the products listed below in addition to any listed on your instruction sheet.  A.P.C or A.P.C with Codeine Codeine Phosphate Capsules #3 Ibuprofen Ridaura  ABC compound Congesprin Imuran rimadil  Advil Cope Indocin Robaxisal  Alka-Seltzer  Effervescent Pain Reliever and Antacid Coricidin or Coricidin-D  Indomethacin Rufen  Alka-Seltzer plus Cold Medicine Cosprin Ketoprofen S-A-C Tablets  Anacin Analgesic Tablets or Capsules Coumadin Korlgesic Salflex  Anacin Extra Strength Analgesic tablets or capsules CP-2 Tablets Lanoril Salicylate  Anaprox Cuprimine Capsules Levenox Salocol  Anexsia-D Dalteparin Magan Salsalate  Anodynos Darvon compound Magnesium Salicylate Sine-off  Ansaid Dasin Capsules Magsal Sodium Salicylate  Anturane Depen Capsules Marnal Soma  APF Arthritis pain formula Dewitt's Pills Measurin Stanback  Argesic Dia-Gesic Meclofenamic Sulfinpyrazone  Arthritis Bayer Timed Release Aspirin Diclofenac Meclomen Sulindac  Arthritis pain formula Anacin Dicumarol Medipren Supac  Analgesic (Safety coated) Arthralgen Diffunasal Mefanamic Suprofen  Arthritis Strength Bufferin Dihydrocodeine Mepro Compound Suprol  Arthropan liquid Dopirydamole Methcarbomol with Aspirin Synalgos  ASA tablets/Enseals Disalcid Micrainin Tagament  Ascriptin Doan's Midol Talwin  Ascriptin A/D Dolene Mobidin Tanderil  Ascriptin Extra Strength Dolobid Moblgesic Ticlid  Ascriptin with Codeine Doloprin or Doloprin with Codeine Momentum Tolectin  Asperbuf Duoprin Mono-gesic Trendar  Aspergum Duradyne Motrin or Motrin IB Triminicin  Aspirin plain, buffered or enteric coated Durasal Myochrisine Trigesic  Aspirin Suppositories Easprin Nalfon Trillsate  Aspirin with Codeine Ecotrin Regular or Extra Strength Naprosyn Uracel  Atromid-S  Efficin Naproxen Ursinus  Auranofin Capsules Elmiron Neocylate Vanquish  Axotal Emagrin Norgesic Verin  Azathioprine Empirin or Empirin with Codeine Normiflo Vitamin E  Azolid Emprazil Nuprin Voltaren  Bayer Aspirin plain, buffered or children's or timed BC Tablets or powders Encaprin Orgaran Warfarin Sodium  Buff-a-Comp Enoxaparin Orudis Zorpin  Buff-a-Comp with Codeine Equegesic Os-Cal-Gesic   Buffaprin Excedrin plain, buffered or Extra Strength Oxalid   Bufferin Arthritis Strength Feldene Oxphenbutazone   Bufferin plain or Extra Strength Feldene Capsules Oxycodone with Aspirin   Bufferin with Codeine Fenoprofen Fenoprofen Pabalate or Pabalate-SF   Buffets II Flogesic Panagesic   Buffinol plain or Extra Strength Florinal or Florinal with Codeine Panwarfarin   Buf-Tabs Flurbiprofen Penicillamine   Butalbital Compound Four-way cold tablets Penicillin   Butazolidin Fragmin Pepto-Bismol   Carbenicillin Geminisyn Percodan   Carna Arthritis Reliever Geopen Persantine   Carprofen Gold's salt Persistin   Chloramphenicol Goody's Phenylbutazone   Chloromycetin Haltrain Piroxlcam   Clmetidine heparin Plaquenil   Cllnoril Hyco-pap Ponstel   Clofibrate Hydroxy chloroquine Propoxyphen         Before stopping any of these medications, be sure to consult the physician who ordered them.  Some, such as Coumadin (Warfarin) are ordered to prevent or treat serious conditions such as "deep thrombosis", "pumonary embolisms", and other heart problems.  The amount of time that you may need off of the medication may also vary with the medication and the reason for which you were taking it.  If you are taking any of these medications, please make sure you notify your pain physician before you undergo any procedures.          Facet Joint Block The facet joints connect the bones of the spine (vertebrae). They make it possible for you to bend, twist, and make other movements with your spine. They also keep  you from bending too far, twisting too far, and making other excessive movements. A facet joint block is a procedure where a numbing medicine (anesthetic) is injected into a facet joint. Often, a type of anti-inflammatory medicine called a steroid is also injected. A facet joint block may be done to diagnose neck or back pain. If the pain gets better after a facet joint block, it means the pain is probably coming from the facet joint. If the  pain does not get better, it means the pain is probably not coming from the facet joint. A facet joint block may also be done to relieve neck or back pain caused by an inflamed facet joint. A facet joint block is only done to relieve pain if the pain does not improve with other methods, such as medicine, exercise programs, and physical therapy. Tell a health care provider about:  Any allergies you have.  All medicines you are taking, including vitamins, herbs, eye drops, creams, and over-the-counter medicines.  Any problems you or family members have had with anesthetic medicines.  Any blood disorders you have.  Any surgeries you have had.  Any medical conditions you have.  Whether you are pregnant or may be pregnant. What are the risks? Generally, this is a safe procedure. However, problems may occur, including:  Bleeding.  Injury to a nerve near the injection site.  Pain at the injection site.  Weakness or numbness in areas controlled by nerves near the injection site.  Infection.  Temporary fluid retention.  Allergic reactions to medicines or dyes.  Injury to other structures or organs near the injection site.  What happens before the procedure?  Follow instructions from your health care provider about eating or drinking restrictions.  Ask your health care provider about: ? Changing or stopping your regular medicines. This is especially important if you are taking diabetes medicines or blood thinners. ? Taking medicines such as  aspirin and ibuprofen. These medicines can thin your blood. Do not take these medicines before your procedure if your health care provider instructs you not to.  Do not take any new dietary supplements or medicines without asking your health care provider first.  Plan to have someone take you home after the procedure. What happens during the procedure?  You may need to remove your clothing and dress in an open-back gown.  The procedure will be done while you are lying on an X-ray table. You will most likely be asked to lie on your stomach, but you may be asked to lie in a different position if an injection will be made in your neck.  Machines will be used to monitor your oxygen levels, heart rate, and blood pressure.  If an injection will be made in your neck, an IV tube will be inserted into one of your veins. Fluids and medicine will flow directly into your body through the IV tube.  The area over the facet joint where the injection will be made will be cleaned with soap. The surrounding skin will be covered with clean drapes.  A numbing medicine (local anesthetic) will be applied to your skin. Your skin may sting or burn for a moment.  A video X-ray machine (fluoroscopy) will be used to locate the joint. In some cases, a CT scan may be used.  A contrast dye may be injected into the facet joint area to help locate the joint.  When the joint is located, an anesthetic will be injected into the joint through the needle.  Your health care provider will ask you whether you feel pain relief. If you do feel relief, a steroid may be injected to provide pain relief for a longer period of time. If you do not feel relief or feel only partial relief, additional injections of an anesthetic may be made in other facet joints.  The needle will be removed.  Your skin will be cleaned.  A bandage (dressing) will be applied over each injection site.  The procedure may vary among health care providers  and hospitals. What happens after the procedure?  You will be observed for 15-30 minutes before being allowed to go home. This information is not intended to replace advice given to you by your health care provider. Make sure you discuss any questions you have with your health care provider. Document Released: 12/08/2006 Document Revised: 08/20/2015 Document Reviewed: 04/14/2015 Elsevier Interactive Patient Education  [employer].

## 2017-06-29 NOTE — Progress Notes (Signed)
Safety precautions to be maintained throughout the outpatient stay will include: orient to surroundings, keep bed in low position, maintain call bell within reach at all times, provide assistance with transfer out of bed and ambulation.  

## 2017-07-04 ENCOUNTER — Telehealth: Payer: Self-pay

## 2017-07-04 NOTE — Telephone Encounter (Signed)
Spoke with Joy at pharmacy to let her know that the script is that they are aware of the dosing for the calcium and vitamin D and that they may use OTC medicine.

## 2017-07-05 ENCOUNTER — Other Ambulatory Visit: Payer: Medicare Other

## 2017-07-13 ENCOUNTER — Other Ambulatory Visit (INDEPENDENT_AMBULATORY_CARE_PROVIDER_SITE_OTHER): Payer: Medicare Other

## 2017-07-13 DIAGNOSIS — E7800 Pure hypercholesterolemia, unspecified: Secondary | ICD-10-CM | POA: Diagnosis not present

## 2017-07-13 DIAGNOSIS — R739 Hyperglycemia, unspecified: Secondary | ICD-10-CM

## 2017-07-13 DIAGNOSIS — I10 Essential (primary) hypertension: Secondary | ICD-10-CM

## 2017-07-13 DIAGNOSIS — Z8546 Personal history of malignant neoplasm of prostate: Secondary | ICD-10-CM | POA: Diagnosis not present

## 2017-07-13 LAB — HEPATIC FUNCTION PANEL
ALT: 35 U/L (ref 0–53)
AST: 30 U/L (ref 0–37)
Albumin: 4.2 g/dL (ref 3.5–5.2)
Alkaline Phosphatase: 57 U/L (ref 39–117)
BILIRUBIN DIRECT: 0.1 mg/dL (ref 0.0–0.3)
BILIRUBIN TOTAL: 0.6 mg/dL (ref 0.2–1.2)
Total Protein: 7.1 g/dL (ref 6.0–8.3)

## 2017-07-13 LAB — BASIC METABOLIC PANEL
BUN: 15 mg/dL (ref 6–23)
CALCIUM: 9.4 mg/dL (ref 8.4–10.5)
CO2: 31 mEq/L (ref 19–32)
CREATININE: 1.27 mg/dL (ref 0.40–1.50)
Chloride: 101 mEq/L (ref 96–112)
GFR: 57.98 mL/min — AB (ref >60.00)
GLUCOSE: 100 mg/dL — AB (ref 70–99)
Potassium: 3.9 mEq/L (ref 3.5–5.1)
Sodium: 141 mEq/L (ref 135–145)

## 2017-07-13 LAB — LIPID PANEL
CHOL/HDL RATIO: 4
CHOLESTEROL: 182 mg/dL (ref 0–200)
HDL: 50.4 mg/dL (ref >39.00)
LDL CALC: 94 mg/dL (ref 0–99)
NonHDL: 131.17
TRIGLYCERIDES: 184 mg/dL — AB (ref 0.0–149.0)
VLDL: 36.8 mg/dL (ref 0.0–40.0)

## 2017-07-13 LAB — HEMOGLOBIN A1C: HEMOGLOBIN A1C: 5.6 % (ref 4.6–6.5)

## 2017-07-13 LAB — PSA, MEDICARE

## 2017-07-14 ENCOUNTER — Ambulatory Visit (HOSPITAL_BASED_OUTPATIENT_CLINIC_OR_DEPARTMENT_OTHER): Payer: Medicare Other | Admitting: Pain Medicine

## 2017-07-14 ENCOUNTER — Other Ambulatory Visit: Payer: Self-pay

## 2017-07-14 ENCOUNTER — Encounter: Payer: Self-pay | Admitting: Internal Medicine

## 2017-07-14 ENCOUNTER — Encounter: Payer: Self-pay | Admitting: Pain Medicine

## 2017-07-14 ENCOUNTER — Ambulatory Visit
Admission: RE | Admit: 2017-07-14 | Discharge: 2017-07-14 | Disposition: A | Discharge status: Home / Self Care | Payer: Medicare Other | Source: Ambulatory Visit | Attending: Pain Medicine | Admitting: Pain Medicine

## 2017-07-14 VITALS — BP 135/75 | HR 62 | Temp 98.1°F | Resp 17 | Ht 68.0 in | Wt 235.0 lb

## 2017-07-14 DIAGNOSIS — M79605 Pain in left leg: Secondary | ICD-10-CM

## 2017-07-14 DIAGNOSIS — M5442 Lumbago with sciatica, left side: Secondary | ICD-10-CM | POA: Diagnosis not present

## 2017-07-14 DIAGNOSIS — G8929 Other chronic pain: Secondary | ICD-10-CM | POA: Insufficient documentation

## 2017-07-14 DIAGNOSIS — M47816 Spondylosis without myelopathy or radiculopathy, lumbar region: Secondary | ICD-10-CM | POA: Insufficient documentation

## 2017-07-14 DIAGNOSIS — M5410 Radiculopathy, site unspecified: Secondary | ICD-10-CM

## 2017-07-14 MED ORDER — ROPIVACAINE HCL 2 MG/ML IJ SOLN
INTRAMUSCULAR | Status: AC
  Filled 2017-07-14: qty 20

## 2017-07-14 MED ORDER — FENTANYL CITRATE (PF) 100 MCG/2ML IJ SOLN
INTRAMUSCULAR | Status: AC
  Filled 2017-07-14: qty 2

## 2017-07-14 MED ORDER — LACTATED RINGERS IV SOLN
1000.0000 mL | Freq: Once | INTRAVENOUS | Status: AC
  Administered 2017-07-14: 1000 mL via INTRAVENOUS

## 2017-07-14 MED ORDER — TRIAMCINOLONE ACETONIDE 40 MG/ML IJ SUSP
40.0000 mg | Freq: Once | INTRAMUSCULAR | Status: AC
  Administered 2017-07-14: 40 mg

## 2017-07-14 MED ORDER — MIDAZOLAM HCL 5 MG/5ML IJ SOLN
INTRAMUSCULAR | Status: AC
  Filled 2017-07-14: qty 5

## 2017-07-14 MED ORDER — ROPIVACAINE HCL 2 MG/ML IJ SOLN
9.0000 mL | Freq: Once | INTRAMUSCULAR | Status: AC
  Administered 2017-07-14: 10 mL via PERINEURAL

## 2017-07-14 MED ORDER — LIDOCAINE HCL 2 % IJ SOLN
10.0000 mL | Freq: Once | INTRAMUSCULAR | Status: AC
Start: 2017-07-14 — End: 2017-07-14
  Administered 2017-07-14: 400 mg

## 2017-07-14 MED ORDER — FENTANYL CITRATE (PF) 100 MCG/2ML IJ SOLN
25.0000 ug | INTRAMUSCULAR | Status: DC | PRN
  Administered 2017-07-14: 100 ug via INTRAVENOUS

## 2017-07-14 MED ORDER — MIDAZOLAM HCL 5 MG/5ML IJ SOLN
1.0000 mg | INTRAMUSCULAR | Status: DC | PRN
  Administered 2017-07-14: 2 mg via INTRAVENOUS

## 2017-07-14 MED ORDER — TRIAMCINOLONE ACETONIDE 40 MG/ML IJ SUSP
INTRAMUSCULAR | Status: AC
  Filled 2017-07-14: qty 2

## 2017-07-14 MED ORDER — LIDOCAINE HCL 2 % IJ SOLN
INTRAMUSCULAR | Status: AC
  Filled 2017-07-14: qty 20

## 2017-07-14 NOTE — Progress Notes (Signed)
Patient's Name: Hector [name]  MRN: [id number]  Referring Provider: Einar Pheasant, MD  DOB: 22-May-1935  PCP: Einar Pheasant, MD  DOS: 07/14/2017  Note by: Gaspar Cola, MD  Service setting: Ambulatory outpatient  Specialty: Interventional Pain Management  Patient type: Established  Location: [facility] (AMB) Pain Management Facility  Visit type: Interventional Procedure   Primary Reason for Visit: Interventional Pain Management Treatment. CC: Back Pain (lower, bilateral)  Procedure:  Anesthesia, Analgesia, Anxiolysis:  Type: Diagnostic Medial Branch Facet Block Region: Lumbar Level: L2, L3, L4, L5, & S1 Medial Branch Level(s) Laterality: Bilateral  Type: Local Anesthesia with Moderate (Conscious) Sedation Local Anesthetic: Lidocaine 1% Route: Intravenous (IV) IV Access: Secured Sedation: Meaningful verbal contact was maintained at all times during the procedure  Indication(s): Analgesia and Anxiety   Indications: 1. Lumbar facet syndrome (Bilateral) (L>R)   2. Lumbar facet osteoarthritis (Bilateral)   3. Lumbar spondylosis   4. Chronic low back pain (Primary Area of Pain) (Bilateral) (L>R)   5. Facet syndrome, lumbar   6. Osteoarthritis of facet joint of lumbar spine   7. Chronic bilateral low back pain with left-sided sciatica    Pain Score: Pre-procedure: 6 /10 Post-procedure: 0-No pain/10  Pre-op Assessment:  Mr. Genest is a 82 y.o. (year old), male patient, seen today for interventional treatment. He  has a past surgical history that includes Prostate surgery; cystocopy (2003); Hemorrhoid surgery; Cardiac catheterization; sleep study; Colon surgery (2006-2008-2011); Cataract Surgery (Right, 02/13/14); and Esophagogastroduodenoscopy (egd) with propofol (N/A, 08/04/2016). Mr. Waldschmidt has a current medication list which includes the following prescription(s): acetaminophen, amlodipine, aspirin, calcium plus d3 absorbable, casanthranol-docusate sodium, cyclobenzaprine, ergocalciferol,  fluticasone, hydrochlorothiazide, hyoscyamine, irbesartan, l-lysine, levothyroxine, magnesium oxide, omeprazole, tramadol, and valacyclovir, and the following Facility-Administered Medications: fentanyl and midazolam. His primarily concern today is the Back Pain (lower, bilateral)  Initial Vital Signs: There were no vitals taken for this visit. BMI: Estimated body mass index is 35.73 kg/m as calculated from the following:   Height as of this encounter: 5\' 8"  (1.727 m).   Weight as of this encounter: 235 lb (106.6 kg).  Risk Assessment: Allergies: Reviewed. He is allergic to tape.  Allergy Precautions: None required Coagulopathies: Reviewed. None identified.  Blood-thinner therapy: None at this time Active Infection(s): Reviewed. None identified. Mr. Chait is afebrile  Site Confirmation: Mr. Trulson was asked to confirm the procedure and laterality before marking the site Procedure checklist: Completed Consent: Before the procedure and under the influence of no sedative(s), amnesic(s), or anxiolytics, the patient was informed of the treatment options, risks and possible complications. To fulfill our ethical and legal obligations, as recommended by the American Medical Association's Code of Ethics, I have informed the patient of my clinical impression; the nature and purpose of the treatment or procedure; the risks, benefits, and possible complications of the intervention; the alternatives, including doing nothing; the risk(s) and benefit(s) of the alternative treatment(s) or procedure(s); and the risk(s) and benefit(s) of doing nothing. The patient was provided information about the general risks and possible complications associated with the procedure. These may include, but are not limited to: failure to achieve desired goals, infection, bleeding, organ or nerve damage, allergic reactions, paralysis, and death. In addition, the patient was informed of those risks and complications associated to  Spine-related procedures, such as failure to decrease pain; infection (i.e.: Meningitis, epidural or intraspinal abscess); bleeding (i.e.: epidural hematoma, subarachnoid hemorrhage, or any other type of intraspinal or peri-dural bleeding); organ or nerve damage (i.e.: Any type of  peripheral nerve, nerve root, or spinal cord injury) with subsequent damage to sensory, motor, and/or autonomic systems, resulting in permanent pain, numbness, and/or weakness of one or several areas of the body; allergic reactions; (i.e.: anaphylactic reaction); and/or death. Furthermore, the patient was informed of those risks and complications associated with the medications. These include, but are not limited to: allergic reactions (i.e.: anaphylactic or anaphylactoid reaction(s)); adrenal axis suppression; blood sugar elevation that in diabetics may result in ketoacidosis or comma; water retention that in patients with history of congestive heart failure may result in shortness of breath, pulmonary edema, and decompensation with resultant heart failure; weight gain; swelling or edema; medication-induced neural toxicity; particulate matter embolism and blood vessel occlusion with resultant organ, and/or nervous system infarction; and/or aseptic necrosis of one or more joints. Finally, the patient was informed that Medicine is not an exact science; therefore, there is also the possibility of unforeseen or unpredictable risks and/or possible complications that may result in a catastrophic outcome. The patient indicated having understood very clearly. We have given the patient no guarantees and we have made no promises. Enough time was given to the patient to ask questions, all of which were answered to the patient's satisfaction. Mr. Sugg has indicated that he wanted to continue with the procedure. Attestation: I, the ordering provider, attest that I have discussed with the patient the benefits, risks, side-effects, alternatives,  likelihood of achieving goals, and potential problems during recovery for the procedure that I have provided informed consent. Date: 07/14/2017; Time: 5:58 AM  Pre-Procedure Preparation:  Monitoring: As per clinic protocol. Respiration, ETCO2, SpO2, BP, heart rate and rhythm monitor placed and checked for adequate function Safety Precautions: Patient was assessed for positional comfort and pressure points before starting the procedure. Time-out: I initiated and conducted the "Time-out" before starting the procedure, as per protocol. The patient was asked to participate by confirming the accuracy of the "Time Out" information. Verification of the correct person, site, and procedure were performed and confirmed by me, the nursing staff, and the patient. "Time-out" conducted as per Joint Commission's Universal Protocol (UP.01.01.01). "Time-out" Date & Time: 07/14/2017; 0846 hrs.  Description of Procedure Process:   Position: Prone Target Area: For Lumbar Facet blocks, the target is the groove formed by the junction of the transverse process and superior articular process. For the L5 dorsal ramus, the target is the notch between superior articular process and sacral ala. For the S1 dorsal ramus, the target is the superior and lateral edge of the posterior S1 Sacral foramen. Approach: Paramedial approach. Area Prepped: Entire Posterior Lumbosacral Region Prepping solution: ChloraPrep (2% chlorhexidine gluconate and 70% isopropyl alcohol) Safety Precautions: Aspiration looking for blood return was conducted prior to all injections. At no point did we inject any substances, as a needle was being advanced. No attempts were made at seeking any paresthesias. Safe injection practices and needle disposal techniques used. Medications properly checked for expiration dates. SDV (single dose vial) medications used. Description of the Procedure: Protocol guidelines were followed. The patient was placed in position  over the fluoroscopy table. The target area was identified and the area prepped in the usual manner. Skin desensitized using vapocoolant spray. Skin & deeper tissues infiltrated with local anesthetic. Appropriate amount of time allowed to pass for local anesthetics to take effect. The procedure needle was introduced through the skin, ipsilateral to the reported pain, and advanced to the target area. Employing the "Medial Branch Technique", the needles were advanced to the angle made by  the superior and medial portion of the transverse process, and the lateral and inferior portion of the superior articulating process of the targeted vertebral bodies. This area is known as "Burton's Eye" or the "Eye of the [place] Dog". A procedure needle was introduced through the skin, and this time advanced to the angle made by the superior and medial border of the sacral ala, and the lateral border of the S1 vertebral body. This last needle was later repositioned at the superior and lateral border of the posterior S1 foramen. Negative aspiration confirmed. Solution injected in intermittent fashion, asking for systemic symptoms every 0.5cc of injectate. The needles were then removed and the area cleansed, making sure to leave some of the prepping solution back to take advantage of its long term bactericidal properties.   Illustration of the posterior view of the lumbar spine and the posterior neural structures. Laminae of L2 through S1 are labeled. DPRL5, dorsal primary ramus of L5; DPRS1, dorsal primary ramus of S1; DPR3, dorsal primary ramus of L3; FJ, facet (zygapophyseal) joint L3-L4; I, inferior articular process of L4; LB1, lateral branch of dorsal primary ramus of L1; IAB, inferior articular branches from L3 medial branch (supplies L4-L5 facet joint); IBP, intermediate branch plexus; MB3, medial branch of dorsal primary ramus of L3; NR3, third lumbar nerve root; S, superior articular process of L5; SAB, superior  articular branches from L4 (supplies L4-5 facet joint also); TP3, transverse process of L3.  Vitals:   07/14/17 0859 07/14/17 0908 07/14/17 0918 07/14/17 0927  BP: 119/73 136/68 138/77 135/75  Pulse: 66 62 64 62  Resp: 16 14 13 17   Temp:  98.4 F (36.9 C)  98.1 F (36.7 C)  TempSrc:      SpO2: 97% 95% 96% 98%  Weight:      Height:        Start Time: 0846 hrs. End Time: 0858 hrs. Materials:  Needle(s) Type: Regular needle Gauge: 22G Length: 3.5-in Medication(s): We administered lactated ringers, midazolam, fentaNYL, lidocaine, triamcinolone acetonide, ropivacaine (PF) 2 mg/mL (0.2%), triamcinolone acetonide, and ropivacaine (PF) 2 mg/mL (0.2%). Please see chart orders for dosing details.  Imaging Guidance (Spinal):  Type of Imaging Technique: Fluoroscopy Guidance (Spinal) Indication(s): Assistance in needle guidance and placement for procedures requiring needle placement in or near specific anatomical locations not easily accessible without such assistance. Exposure Time: Please see nurses notes. Contrast: None used. Fluoroscopic Guidance: I was personally present during the use of fluoroscopy. "Tunnel Vision Technique" used to obtain the best possible view of the target area. Parallax error corrected before commencing the procedure. "Direction-depth-direction" technique used to introduce the needle under continuous pulsed fluoroscopy. Once target was reached, antero-posterior, oblique, and lateral fluoroscopic projection used confirm needle placement in all planes. Images permanently stored in EMR. Interpretation: No contrast injected. I personally interpreted the imaging intraoperatively. Adequate needle placement confirmed in multiple planes. Permanent images saved into the patient's record.  Antibiotic Prophylaxis:  Indication(s): None identified Antibiotic given: None  Post-operative Assessment:  EBL: None Complications: No immediate post-treatment complications observed by  team, or reported by patient. Note: The patient tolerated the entire procedure well. A repeat set of vitals were taken after the procedure and the patient was kept under observation following institutional policy, for this type of procedure. Post-procedural neurological assessment was performed, showing return to baseline, prior to discharge. The patient was provided with post-procedure discharge instructions, including a section on how to identify potential problems. Should any problems arise concerning this procedure, the patient  was given instructions to immediately contact us, at any time, without hesitation. In any case, we plan to contact the patient by telephone for a follow-up status report regarding this interventional procedure. Comments:  No additional relevant information.  Plan of Care    Imaging Orders     DG C-Arm 1-60 Min-No Report  Procedure Orders     LUMBAR FACET(MEDIAL BRANCH NERVE BLOCK) MBNB  Medications ordered for procedure: Meds ordered this encounter  Medications  . lactated ringers infusion 1,000 mL  . midazolam (VERSED) 5 MG/5ML injection 1-2 mg    Make sure Flumazenil is available in the pyxis when using this medication. If oversedation occurs, administer 0.2 mg IV over 15 sec. If after 45 sec no response, administer 0.2 mg again over 1 min; may repeat at 1 min intervals; not to exceed 4 doses (1 mg)  . fentaNYL (SUBLIMAZE) injection 25-50 mcg    Make sure Narcan is available in the pyxis when using this medication. In the event of respiratory depression (RR< 8/min): Titrate NARCAN (naloxone) in increments of 0.1 to 0.2 mg IV at 2-3 minute intervals, until desired degree of reversal.  . lidocaine (XYLOCAINE) 2 % (with pres) injection 200 mg  . triamcinolone acetonide (KENALOG-40) injection 40 mg  . ropivacaine (PF) 2 mg/mL (0.2%) (NAROPIN) injection 9 mL  . triamcinolone acetonide (KENALOG-40) injection 40 mg  . ropivacaine (PF) 2 mg/mL (0.2%) (NAROPIN)  injection 9 mL   Medications administered: We administered lactated ringers, midazolam, fentaNYL, lidocaine, triamcinolone acetonide, ropivacaine (PF) 2 mg/mL (0.2%), triamcinolone acetonide, and ropivacaine (PF) 2 mg/mL (0.2%).  See the medical record for exact dosing, route, and time of administration.  This SmartLink is deprecated. Use AVSMEDLIST instead to display the medication list for a patient. Disposition: Discharge home  Discharge Date & Time: 07/14/2017; 0932 hrs.   Physician-requested Follow-up: Return for post-procedure eval by Dr. Dossie Arbour in 2 wks. Future Appointments  Date Time Provider [place]  07/19/2017  8:00 AM Einar Pheasant, MD [facility]  08/08/2017  9:15 AM Milinda Pointer, MD [facility] None  05/25/2018  9:00 AM O'Brien-Blaney, Bryson Corona, LPN [facility]   Primary Care Physician: Einar Pheasant, MD Location: [facility] Note by: Gaspar Cola, MD Date: 07/14/2017; Time: 10:16 AM  Disclaimer:  Medicine is not an exact science. The only guarantee in medicine is that nothing is guaranteed. It is important to note that the decision to proceed with this intervention was based on the information collected from the patient. The Data and conclusions were drawn from the patient's questionnaire, the interview, and the physical examination. Because the information was provided in large part by the patient, it cannot be guaranteed that it has not been purposely or unconsciously manipulated. Every effort has been made to obtain as much relevant data as possible for this evaluation. It is important to note that the conclusions that lead to this procedure are derived in large part from the available data. Always take into account that the treatment will also be dependent on availability of resources and existing treatment guidelines, considered by other Pain Management Practitioners as being common knowledge and practice, at the time  of the intervention. For Medico-Legal purposes, it is also important to point out that variation in procedural techniques and pharmacological choices are the acceptable norm. The indications, contraindications, technique, and results of the above procedure should only be interpreted and judged by a Board-Certified Interventional Pain Specialist with extensive familiarity and expertise in the same  exact procedure and technique.

## 2017-07-14 NOTE — Progress Notes (Signed)
Safety precautions to be maintained throughout the outpatient stay will include: orient to surroundings, keep bed in low position, maintain call bell within reach at all times, provide assistance with transfer out of bed and ambulation.  

## 2017-07-14 NOTE — Patient Instructions (Addendum)
____________________________________________________________________________________________  Post-Procedure instructions Instructions:  Apply ice: Fill a plastic sandwich bag with crushed ice. Cover it with a small towel and apply to injection site. Apply for 15 minutes then remove x 15 minutes. Repeat sequence on day of procedure, until you go to bed. The purpose is to minimize swelling and discomfort after procedure.  Apply heat: Apply heat to procedure site starting the day following the procedure. The purpose is to treat any soreness and discomfort from the procedure.  Food intake: Start with clear liquids (like water) and advance to regular food, as tolerated.   Physical activities: Keep activities to a minimum for the first 8 hours after the procedure.   Driving: If you have received any sedation, you are not allowed to drive for 24 hours after your procedure.  Blood thinner: Restart your blood thinner 6 hours after your procedure. (Only for those taking blood thinners)  Insulin: As soon as you can eat, you may resume your normal dosing schedule. (Only for those taking insulin)  Infection prevention: Keep procedure site clean and dry.  Post-procedure Pain Diary: Extremely important that this be done correctly and accurately. Recorded information will be used to determine the next step in treatment.  Pain evaluated is that of treated area only. Do not include pain from an untreated area.  Complete every hour, on the hour, for the initial 8 hours. Set an alarm to help you do this part accurately.  Do not go to sleep and have it completed later. It will not be accurate.  Follow-up appointment: Keep your follow-up appointment after the procedure. Usually 2 weeks for most procedures. (6 weeks in the case of radiofrequency.) Bring you pain diary.  Expect:  From numbing medicine (AKA: Local Anesthetics): Numbness or decrease in pain.  Onset: Full effect within 15 minutes of  injected.  Duration: It will depend on the type of local anesthetic used. On the average, 1 to 8 hours.   From steroids: Decrease in swelling or inflammation. Once inflammation is improved, relief of the pain will follow.  Onset of benefits: Depends on the amount of swelling present. The more swelling, the longer it will take for the benefits to be seen. In some cases, up to 10 days.  Duration: Steroids will stay in the system x 2 weeks. Duration of benefits will depend on multiple posibilities including persistent irritating factors.  From procedure: Some discomfort is to be expected once the numbing medicine wears off. This should be minimal if ice and heat are applied as instructed. Call if:  You experience numbness and weakness that gets worse with time, as opposed to wearing off.  New onset bowel or bladder incontinence. (Spinal procedures only)  Emergency Numbers:  Durning business hours (Monday - Thursday, 8:00 AM - 4:00 PM) (Friday, 9:00 AM - 12:00 Noon): [phone]  After hours: [phone] ____________________________________________________________________________________________   Post-procedure Information What to expect: Most procedures involve the use of a local anesthetic (numbing medicine), and a steroid (anti-inflammatory medicine).  The local anesthetics may cause temporary numbness and weakness of the legs or arms, depending on the location of the block. This numbness/weakness may last 4-6 hours, depending on the local anesthetic used. In rare instances, it can last up to 24 hours. While numb, you must be very careful not to injure the extremity.  After any procedure, you could expect the pain to get better within 15-20 minutes. This relief is temporary and may last 4-6 hours. Once the local anesthetics wears   off, you could experience discomfort, possibly more than usual, for up to 10 (ten) days. In the case of radiofrequencies, it may last up to 6 weeks.  Surgeries may take up to 8 weeks for the healing process. The discomfort is due to the irritation caused by needles going through skin and muscle. To minimize the discomfort, we recommend using ice the first day, and heat from then on. The ice should be applied for 15 minutes on, and 15 minutes off. Keep repeating this cycle until bedtime. Avoid applying the ice directly to the skin, to prevent frostbite. Heat should be used daily, until the pain improves (4-10 days). Be careful not to burn yourself.  Occasionally you may experience muscle spasms or cramps. These occur as a consequence of the irritation caused by the needle sticks to the muscle and the blood that will inevitably be lost into the surrounding muscle tissue. Blood tends to be very irritating to tissues, which tend to react by going into spasm. These spasms may start the same day of your procedure, but they may also take days to develop. This late onset type of spasm or cramp is usually caused by electrolyte imbalances triggered by the steroids, at the level of the kidney. Cramps and spasms tend to respond well to muscle relaxants, multivitamins (some are triggered by the procedure, but may have their origins in vitamin deficiencies), and "Gatorade", or any sports drinks that can replenish any electrolyte imbalances. (If you are a diabetic, ask your pharmacist to get you a sugar-free brand.) Warm showers or baths may also be helpful. Stretching exercises are highly recommended. General Instructions:  Be alert for signs of possible infection: redness, swelling, heat, red streaks, elevated temperature, and/or fever. These typically appear 4 to 6 days after the procedure. Immediately notify your doctor if you experience unusual bleeding, difficulty breathing, or loss of bowel or bladder control. If you experience increased pain, do not increase your pain medicine intake, unless instructed by your pain physician. Post-Procedure Care:  Be careful in  moving about. Muscle spasms in the area of the injection may occur. Applying ice or heat to the area is often helpful. The incidence of spinal headaches after epidural injections ranges between 1.4% and 6%. If you develop a headache that does not seem to respond to conservative therapy, please let your physician know. This can be treated with an epidural blood patch.   Post-procedure numbness or redness is to be expected, however it should average 4 to 6 hours. If numbness and weakness of your extremities begins to develop 4 to 6 hours after your procedure, and is felt to be progressing and worsening, immediately contact your physician.   Diet:  If you experience nausea, do not eat until this sensation goes away. If you had a "Stellate Ganglion Block" for upper extremity "Reflex Sympathetic Dystrophy", do not eat or drink until your hoarseness goes away. In any case, always start with liquids first and if you tolerate them well, then slowly progress to more solid foods. Activity:  For the first 4 to 6 hours after the procedure, use caution in moving about as you may experience numbness and/or weakness. Use caution in cooking, using household electrical appliances, and climbing steps. If you need to reach your Doctor call our office: [phone] Monday-Thursday 8:00 am - 4:00 PM    Fridays: Closed     In case of an emergency: In case of emergency, call 911 or go to the nearest emergency room and   have the physician there call us.  Interpretation of Procedure Every nerve block has two components: a diagnostic component, and a treatment component. Unrealistic expectations are the most common causes of "perceived failure".  In a perfect world, a single nerve block should be able to completely and permanently eliminate the pain. Sadly, the world is not perfect.  Most pain management nerve blocks are performed using local anesthetics and steroids. Steroids are responsible for any long-term benefit that  you may experience. Their purpose is to decrease any chronic swelling that may exist in the area. Steroids begin to work immediately after being injected. However, most patients will not experience any benefits until 5 to 10 days after the injection, when the swelling has come down to the point where they can tell a difference. Steroids will only help if there is swelling to be treated. As such, they can assist with the diagnosis. If effective, they suggest an inflammatory component to the pain, and if ineffective, they rule out inflammation as the main cause or component of the problem. If the problem is one of mechanical compression, you will get no benefit from those steroids.   In the case of local anesthetics, they have a crucial role in the diagnosis of your condition. Most will begin to work within15 to 20 minutes after injection. The duration will depend on the type used (short- vs. Long-acting). It is of outmost importance that patients keep tract of their pain, after the procedure. To assist with this matter, a "Post-procedure Pain Diary" is provided. Make sure to complete it and to bring it back to your follow-up appointment.  As long as the patient keeps accurate, detailed records of their symptoms after every procedure, and returns to have those interpreted, every procedure will provide us with invaluable information. Even a block that does not provide the patient with any relief, will always provide us with information about the mechanism and the origin of the pain. The only time a nerve block can be considered a waste of time is when patients do not keep track of the results, or do not keep their post-procedure appointment.  Reporting the results back to your physician The Pain Score  Pain is a subjective complaint. It cannot be seen, touched, or measured. We depend entirely on the patient's report of the pain in order to assess your condition and treatment. To evaluate the pain, we use a pain  scale, where "0" means "No Pain", and a "10" is "the worst possible pain that you can even imagine" (i.e. something like been eaten alive by a shark or being torn apart by a lion).   You will frequently be asked to rate your pain. Please be as accurate, remember that medical decisions will be based on your responses. Please do not rate your pain above a 10. Doing so is actually interpreted as "symptom magnification" (exaggeration), as well as lack of understanding with regards to the scale. To put this into perspective, when you tell us that your pain is at a 10 (ten), what you are saying is that there is nothing we can do to make this pain any worse. (Carefully think about that.) 

## 2017-07-15 ENCOUNTER — Other Ambulatory Visit: Payer: Medicare Other

## 2017-07-15 ENCOUNTER — Telehealth: Payer: Self-pay | Admitting: *Deleted

## 2017-07-15 NOTE — Telephone Encounter (Signed)
Attemtped to call for patient for follow-up. Message left.

## 2017-07-19 ENCOUNTER — Ambulatory Visit (INDEPENDENT_AMBULATORY_CARE_PROVIDER_SITE_OTHER): Payer: Medicare Other | Admitting: Internal Medicine

## 2017-07-19 ENCOUNTER — Encounter: Payer: Self-pay | Admitting: Internal Medicine

## 2017-07-19 VITALS — BP 146/72 | HR 52 | Temp 97.5°F | Resp 16 | Ht 68.0 in | Wt 240.2 lb

## 2017-07-19 DIAGNOSIS — K2270 Barrett's esophagus without dysplasia: Secondary | ICD-10-CM | POA: Diagnosis not present

## 2017-07-19 DIAGNOSIS — I10 Essential (primary) hypertension: Secondary | ICD-10-CM

## 2017-07-19 DIAGNOSIS — C439 Malignant melanoma of skin, unspecified: Secondary | ICD-10-CM | POA: Diagnosis not present

## 2017-07-19 DIAGNOSIS — R739 Hyperglycemia, unspecified: Secondary | ICD-10-CM | POA: Diagnosis not present

## 2017-07-19 DIAGNOSIS — E559 Vitamin D deficiency, unspecified: Secondary | ICD-10-CM | POA: Diagnosis not present

## 2017-07-19 DIAGNOSIS — Z8546 Personal history of malignant neoplasm of prostate: Secondary | ICD-10-CM | POA: Diagnosis not present

## 2017-07-19 DIAGNOSIS — Z6836 Body mass index (BMI) 36.0-36.9, adult: Secondary | ICD-10-CM | POA: Diagnosis not present

## 2017-07-19 DIAGNOSIS — M47816 Spondylosis without myelopathy or radiculopathy, lumbar region: Secondary | ICD-10-CM | POA: Diagnosis not present

## 2017-07-19 NOTE — Assessment & Plan Note (Addendum)
S/p prostatectomy.  Recent PSA <.01.   

## 2017-07-19 NOTE — Progress Notes (Signed)
[name], male   DOB: Dec 21, 1934, 82 y.o.   MRN: [id number]   Subjective:    [name], male    DOB: June 05, 1935, 82 y.o.   MRN: [id number]  HPI  Patient here for a scheduled follow up.  S/p nerve block 07/14/17.  Back pain is better.  Continues to f/u at pain clinic.  States otherwise doing well.  No chest pain.  Breathing stable.  No sob. No acid reflux.  No abdominal pain.  Bowels moving.  Discussed labs.  Triglycerides improved.     Past Medical History:  Diagnosis Date  . Anemia   . Arthritis   . Cancer ([place])    prostate,skin  . Chicken pox   . Diverticulitis   . Dysrhythmia   . GERD (gastroesophageal reflux disease)   . Hyperlipidemia   . Hypertension   . Hypothyroidism   . Melanoma ([place])    Malignant resection  . Sleep apnea   . Ulcer    Past Surgical History:  Procedure Laterality Date  . CARDIAC CATHETERIZATION    . Cataract Surgery Right 02/13/14  . COLON SURGERY  2006-2008-2011   polyps removed  . cystocopy  2003  . ESOPHAGOGASTRODUODENOSCOPY (EGD) WITH PROPOFOL N/A 08/04/2016   Procedure: ESOPHAGOGASTRODUODENOSCOPY (EGD) WITH PROPOFOL;  Surgeon: Manya Silvas, MD;  Location: [facility] ENDOSCOPY;  Service: Endoscopy;  Laterality: N/A;  . HEMORRHOID SURGERY    . PROSTATE SURGERY    . sleep study     Family History  Problem Relation Age of Onset  . Liver disease Mother   . Heart disease Father   . Kidney disease Father    Social History   Socioeconomic History  . Marital status: Married    Spouse name: None  . Number of children: None  . Years of education: None  . Highest education level: None  Social Needs  . Financial resource strain: None  . Food insecurity - worry: None  . Food insecurity - inability: None  . Transportation needs - medical: None  . Transportation needs - non-medical: None  Occupational History  . None  Tobacco Use  . Smoking status: Never Smoker  . Smokeless tobacco: Never Used  Substance and  Sexual Activity  . Alcohol use: No    Alcohol/week: 0.0 oz  . Drug use: No  . Sexual activity: No  Other Topics Concern  . None  Social History Narrative  . None    Outpatient Encounter Medications as of 07/19/2017  Medication Sig  . acetaminophen (TYLENOL) 500 MG tablet Take 500 mg by mouth every 6 (six) hours as needed.  Marland Kitchen amLODipine (NORVASC) 5 MG tablet TAKE 1 TABLET BY MOUTH EVERY DAY  . aspirin 81 MG tablet Take 81 mg by mouth daily.  . Calcium Carb-Cholecalciferol (CALCIUM PLUS D3 ABSORBABLE) [phone] MG-UNIT CAPS Take 1 capsule by mouth daily with breakfast.  . Casanthranol-Docusate Sodium 30-100 MG CAPS Take by mouth.  . cyclobenzaprine (FLEXERIL) 5 MG tablet Take 1 tablet (5 mg total) by mouth at bedtime.  . ergocalciferol (VITAMIN D2) 50000 units capsule Take 1 capsule (50,000 Units total) by mouth 2 (two) times a week. X 6 weeks.  . fluticasone (FLONASE) 50 MCG/ACT nasal spray TWO PUFFS IN EACH NOSTRIL ONCE A DAY  . hydrochlorothiazide (HYDRODIURIL) 25 MG tablet TAKE 1 TABLET BY MOUTH EVERY DAY  . hyoscyamine (LEVSIN, ANASPAZ) 0.125 MG tablet Take 0.125 mg by mouth as needed.  . irbesartan (AVAPRO) 300 MG tablet  TAKE 1 TABLET BY MOUTH EVERY DAY  . L-Lysine 500 MG TABS Take by mouth daily.  Marland Kitchen levothyroxine (SYNTHROID, LEVOTHROID) 88 MCG tablet TAKE 1 TABLET BY MOUTH EVERY DAY  . Magnesium Oxide 500 MG CAPS Take 1 capsule (500 mg total) by mouth 2 (two) times daily at 8 am and 10 pm.  . omeprazole (PRILOSEC) 20 MG capsule TAKE ONE CAPSULE BY MOUTH TWICE A DAY  . traMADol (ULTRAM) 50 MG tablet Take 1 tablet (50 mg total) by mouth every 8 (eight) hours as needed for severe pain.  . valACYclovir (VALTREX) 1000 MG tablet TAKE 1 TABLET BY MOUTH EVERY DAY.   No facility-administered encounter medications on file as of 07/19/2017.     Review of Systems  Constitutional: Negative for appetite change and unexpected weight change.  HENT: Negative for congestion and sinus pressure.    Respiratory: Negative for cough, chest tightness and shortness of breath.   Cardiovascular: Negative for chest pain, palpitations and leg swelling.  Gastrointestinal: Negative for abdominal pain, diarrhea, nausea and vomiting.  Genitourinary: Negative for difficulty urinating and dysuria.  Musculoskeletal: Positive for back pain.       Pain improved s/p nerve block.    Skin: Negative for color change and rash.  Neurological: Negative for dizziness, light-headedness and headaches.  Psychiatric/Behavioral: Negative for agitation and dysphoric mood.       Objective:    Physical Exam  Constitutional: He appears well-developed and well-nourished. No distress.  HENT:  Nose: Nose normal.  Mouth/Throat: Oropharynx is clear and moist.  Neck: Neck supple. No thyromegaly present.  Cardiovascular: Normal rate and regular rhythm.  Pulmonary/Chest: Effort normal and breath sounds normal. No respiratory distress.  Abdominal: Soft. Bowel sounds are normal. There is no tenderness.  Musculoskeletal: He exhibits no edema or tenderness.  Lymphadenopathy:    He has no cervical adenopathy.  Skin: No rash noted. No erythema.  Psychiatric: He has a normal mood and affect. His behavior is normal.    BP (!) 146/72 (BP Location: Left Arm, Patient Position: Sitting, Cuff Size: Large)   Pulse (!) 52   Temp (!) 97.5 F (36.4 C) (Oral)   Resp 16   Ht 5\' 8"  (1.727 m)   Wt 240 lb 3.2 oz (109 kg)   SpO2 97%   BMI 36.52 kg/m  Wt Readings from Last 3 Encounters:  07/19/17 240 lb 3.2 oz (109 kg)  07/14/17 235 lb (106.6 kg)  06/29/17 240 lb (108.9 kg)     Lab Results  Component Value Date   WBC 7.4 11/25/2016   HGB 15.7 11/25/2016   HCT 46.4 11/25/2016   PLT 247.0 11/25/2016   GLUCOSE 100 (H) 07/13/2017   CHOL 182 07/13/2017   TRIG 184.0 (H) 07/13/2017   HDL 50.40 07/13/2017   LDLDIRECT 111.0 02/24/2017   LDLCALC 94 07/13/2017   ALT 35 07/13/2017   AST 30 07/13/2017   NA 141 07/13/2017   K  3.9 07/13/2017   CL 101 07/13/2017   CREATININE 1.27 07/13/2017   BUN 15 07/13/2017   CO2 31 07/13/2017   TSH 2.86 11/25/2016   PSA <0.01 Repeated and verified X2. (L) 07/13/2017   HGBA1C 5.6 07/13/2017       Assessment & Plan:   Problem List Items Addressed This Visit    Barrett's esophagus    S/p EGD 08/04/16 - followed by GI.  Symptoms controlled.        BMI 36.0-36.9,adult    Diet and exercise.  Follow.  History of prostate cancer    S/p prostatectomy.  Recent PSA <.01.       Hypertension    Blood pressure slightly elevated today.  Just had injection.  Pain is better.  Have him spot check his pressure.  Notify me if elevation.  Follow metabolic panel.        Relevant Orders   Lipid panel   Hepatic function panel   CBC with Differential/Platelet   Basic metabolic panel   Malignant melanoma ([place])    Followed by dermatology.       Osteoarthritis of lumbar spine (Chronic)    S/p recent nerve block.  Doing better.  Pain better.        Vitamin D deficiency    On vitamin D supplements.         Other Visit Diagnoses    Hyperglycemia    -  Primary   Relevant Orders   Hemoglobin A1c       Einar Pheasant, MD

## 2017-07-19 NOTE — Assessment & Plan Note (Signed)
Followed by dermatology

## 2017-07-19 NOTE — Assessment & Plan Note (Signed)
S/p EGD 08/04/16 - followed by GI.  Symptoms controlled.

## 2017-07-19 NOTE — Assessment & Plan Note (Signed)
S/p recent nerve block.  Doing better.  Pain better.

## 2017-07-19 NOTE — Assessment & Plan Note (Signed)
Blood pressure slightly elevated today.  Just had injection.  Pain is better.  Have him spot check his pressure.  Notify me if elevation.  Follow metabolic panel.

## 2017-07-19 NOTE — Assessment & Plan Note (Signed)
Diet and exercise.  Follow.  

## 2017-07-19 NOTE — Assessment & Plan Note (Signed)
On vitamin D supplements.

## 2017-08-08 ENCOUNTER — Ambulatory Visit: Payer: Medicare Other | Admitting: Pain Medicine

## 2017-08-09 NOTE — Progress Notes (Signed)
Patient's Name: Hector [name]  MRN: [id number]  Referring Provider: Einar Pheasant, MD  DOB: 1934/09/06  PCP: Einar Pheasant, MD  DOS: 08/10/2017  Note by: Gaspar Cola, MD  Service setting: Ambulatory outpatient  Specialty: Interventional Pain Management  Location: [facility] (AMB) Pain Management Facility    Patient type: Established   Primary Reason(s) for Visit: Encounter for post-procedure evaluation of chronic illness with mild to moderate exacerbation CC: Back Pain (lower, worse on the right)  HPI  Hector [name] is a 83 y.o. year old, male patient, who comes today for a post-procedure evaluation. He has Hypertension; Hypercholesterolemia; Sleep apnea; History of prostate cancer; Malignant melanoma ([place]); History of colonic polyps; Barrett's esophagus; BMI 36.0-36.9,adult; Pharmacologic therapy; URI (upper respiratory infection); Shoulder pain, left; Chronic hip pain (Left); Chronic low back pain (Primary Area of Pain) (Bilateral) (L>R); Chronic pain syndrome; Other long term (current) drug therapy; Other reduced mobility; Other specified health status; Polyarthritis, unspecified (Secondary Area of Pain); L5-S1 bilateral pars defect with spondylolisthesis; Lumbosacral Grade 1 Anterolisthesis of L5 over S1; Lumbar facet arthropathy (Bilateral); Lumbar foraminal stenosis (L5-S1) (Bilateral) (L>R); Chronic Lumbar radiculitis (L5) (Left); Disorder of skeletal system; Problems influencing health status; Opiate use; Chronic musculoskeletal pain; Vitamin D deficiency; Lumbar facet syndrome (Bilateral) (L>R); Chronic lower extremity pain (Secondary Area of Pain) (Left); Chronic lower extremity radicular pain (L5) (Left); Lumbar facet osteoarthritis (Bilateral); Osteoarthritis of lumbar spine; and Lumbar spondylosis on their problem list. His primarily concern today is the Back Pain (lower, worse on the right)  Pain Assessment: Location: Lower, Right Back Radiating: NA Onset: More than a month ago Duration:  Chronic pain Quality: (no pain currently) Severity: 0-No pain/10 (self-reported pain score)  Note: Reported level is compatible with observation.                               Effect on ADL: activity level is improved since having procedure.  Timing: Intermittent Modifying factors: procedure, is not having to take Tramadol currently unless activity is more and the back is hurting or fatigued   Hector [name] comes in today for post-procedure evaluation after the treatment done on 07/14/2017.  Further details on both, my assessment(s), as well as the proposed treatment plan, please see below.  Post-Procedure Assessment  07/14/2017 Procedure: Diagnostic bilateral sided lumbar facet block #1 Pre-procedure pain score:  6/10 Post-procedure pain score: 0/10 (100% relief) Influential Factors: BMI: 36.49 kg/m Intra-procedural challenges: None observed.         Assessment challenges: None detected.              Reported side-effects: None.        Post-procedural adverse reactions or complications: None reported         Sedation: Sedation provided. When no sedatives are used, the analgesic levels obtained are directly associated to the effectiveness of the local anesthetics. However, when sedation is provided, the level of analgesia obtained during the initial 1 hour following the intervention, is believed to be the result of a combination of factors. These factors may include, but are not limited to: 1. The effectiveness of the local anesthetics used. 2. The effects of the analgesic(s) and/or anxiolytic(s) used. 3. The degree of discomfort experienced by the patient at the time of the procedure. 4. The patients ability and reliability in recalling and recording the events. 5. The presence and influence of possible secondary gains and/or psychosocial factors. Reported result: Relief experienced during the  1st hour after the procedure: 90 % (Ultra-Short Term Relief)            Interpretative  annotation: Clinically appropriate result. Analgesia during this period is likely to be Local Anesthetic and/or IV Sedative (Analgesic/Anxiolytic) related.          Effects of local anesthetic: The analgesic effects attained during this period are directly associated to the localized infiltration of local anesthetics and therefore cary significant diagnostic value as to the etiological location, or anatomical origin, of the pain. Expected duration of relief is directly dependent on the pharmacodynamics of the local anesthetic used. Long-acting (4-6 hours) anesthetics used.  Reported result: Relief during the next 4 to 6 hour after the procedure: 100 % (Short-Term Relief)            Interpretative annotation: Clinically appropriate result. Analgesia during this period is likely to be Local Anesthetic-related.          Long-term benefit: Defined as the period of time past the expected duration of local anesthetics (1 hour for short-acting and 4-6 hours for long-acting). With the possible exception of prolonged sympathetic blockade from the local anesthetics, benefits during this period are typically attributed to, or associated with, other factors such as analgesic sensory neuropraxia, antiinflammatory effects, or beneficial biochemical changes provided by agents other than the local anesthetics.  Reported result: Extended relief following procedure: 100 % (Long-Term Relief)            Interpretative annotation: Clinically appropriate result. Good relief. No permanent benefit expected. Inflammation plays a part in the etiology to the pain.          Current benefits: Defined as reported results that persistent at this point in time.   Analgesia: 100 % Mr. Eroh reports improvement of axial symptoms. Function: Somewhat improved ROM: Somewhat improved Interpretative annotation: Recurrence of symptoms. No permanent benefit expected. Effective diagnostic intervention.          Interpretation: Results would  suggest a successful diagnostic intervention.                  Plan:  Please see "Plan of Care" for details.        Laboratory Chemistry  Inflammation Markers (CRP: Acute Phase) (ESR: Chronic Phase) Lab Results  Component Value Date   CRP 2.8 06/13/2017   ESRSEDRATE 13 06/13/2017                 Renal Function Markers Lab Results  Component Value Date   BUN 15 07/13/2017   CREATININE 1.27 07/13/2017   GFRAA 56 (L) 06/13/2017   GFRNONAA 49 (L) 06/13/2017                 Hepatic Function Markers Lab Results  Component Value Date   AST 30 07/13/2017   ALT 35 07/13/2017   ALBUMIN 4.2 07/13/2017   ALKPHOS 57 07/13/2017                 Electrolytes Lab Results  Component Value Date   NA 141 07/13/2017   K 3.9 07/13/2017   CL 101 07/13/2017   CALCIUM 9.4 07/13/2017   MG 2.0 06/13/2017                 Neuropathy Markers Lab Results  Component Value Date   VITAMINB12 238 06/13/2017   HGBA1C 5.6 07/13/2017                 Bone Pathology Markers Lab Results  Component  Value Date   25OHVITD1 20 (L) 06/13/2017   25OHVITD2 <1.0 06/13/2017   25OHVITD3 20 06/13/2017                 Coagulation Parameters Lab Results  Component Value Date   PLT 247.0 11/25/2016                 Cardiovascular Markers Lab Results  Component Value Date   HGB 15.7 11/25/2016   HCT 46.4 11/25/2016                 Note: Lab results reviewed.  Recent Diagnostic Imaging Results  DG C-Arm 1-60 Min-No Report Fluoroscopy was utilized by the requesting physician.  No radiographic  interpretation.   Complexity Note: Imaging results reviewed. Results shared with Mr. Bulthuis, using Layman's terms.                         Meds   Current Outpatient Medications:  .  acetaminophen (TYLENOL) 500 MG tablet, Take 500 mg by mouth every 6 (six) hours as needed., Disp: , Rfl:  .  amLODipine (NORVASC) 5 MG tablet, TAKE 1 TABLET BY MOUTH EVERY DAY, Disp: 30 tablet, Rfl: 5 .  aspirin 81 MG tablet,  Take 81 mg by mouth daily., Disp: , Rfl:  .  Calcium Carb-Cholecalciferol (CALCIUM PLUS D3 ABSORBABLE) [phone] MG-UNIT CAPS, Take 1 capsule by mouth daily with breakfast., Disp: 90 capsule, Rfl: 0 .  cyclobenzaprine (FLEXERIL) 5 MG tablet, Take 1 tablet (5 mg total) by mouth at bedtime., Disp: 30 tablet, Rfl: 2 .  ergocalciferol (VITAMIN D2) 50000 units capsule, Take 1 capsule (50,000 Units total) by mouth 2 (two) times a week. X 6 weeks., Disp: 12 capsule, Rfl: 0 .  fluticasone (FLONASE) 50 MCG/ACT nasal spray, TWO PUFFS IN EACH NOSTRIL ONCE A DAY, Disp: 16 g, Rfl: 4 .  hydrochlorothiazide (HYDRODIURIL) 25 MG tablet, TAKE 1 TABLET BY MOUTH EVERY DAY, Disp: 30 tablet, Rfl: 5 .  hyoscyamine (LEVSIN, ANASPAZ) 0.125 MG tablet, Take 0.125 mg by mouth as needed., Disp: , Rfl:  .  irbesartan (AVAPRO) 300 MG tablet, TAKE 1 TABLET BY MOUTH EVERY DAY, Disp: 30 tablet, Rfl: 5 .  L-Lysine 500 MG TABS, Take by mouth daily., Disp: , Rfl:  .  levothyroxine (SYNTHROID, LEVOTHROID) 88 MCG tablet, TAKE 1 TABLET BY MOUTH EVERY DAY, Disp: 30 tablet, Rfl: 3 .  Magnesium Oxide 500 MG CAPS, Take 1 capsule (500 mg total) by mouth 2 (two) times daily at 8 am and 10 pm., Disp: 90 capsule, Rfl: 0 .  omeprazole (PRILOSEC) 20 MG capsule, TAKE ONE CAPSULE BY MOUTH TWICE A DAY, Disp: 60 capsule, Rfl: 3 .  traMADol (ULTRAM) 50 MG tablet, Take 1 tablet (50 mg total) by mouth every 8 (eight) hours as needed for severe pain., Disp: 90 tablet, Rfl: 2 .  valACYclovir (VALTREX) 1000 MG tablet, TAKE 1 TABLET BY MOUTH EVERY DAY., Disp: 30 tablet, Rfl: 3  ROS  Constitutional: Denies any fever or chills Gastrointestinal: No reported hemesis, hematochezia, vomiting, or acute GI distress Musculoskeletal: Denies any acute onset joint swelling, redness, loss of ROM, or weakness Neurological: No reported episodes of acute onset apraxia, aphasia, dysarthria, agnosia, amnesia, paralysis, loss of coordination, or loss of  consciousness  Allergies  Mr. Mcinnis is allergic to tape.  [place]  Drug: Mr. Urbanek  reports that he does not use drugs. Alcohol:  reports that he does not drink alcohol. Tobacco:  reports that  has never smoked. he has never used smokeless tobacco. Medical:  has a past medical history of Anemia, Arthritis, Cancer ([place]), Chicken pox, Diverticulitis, Dysrhythmia, GERD (gastroesophageal reflux disease), Hyperlipidemia, Hypertension, Hypothyroidism, Melanoma ([place]), Sleep apnea, and Ulcer. Surgical: Mr. Jafri  has a past surgical history that includes Prostate surgery; cystocopy (2003); Hemorrhoid surgery; Cardiac catheterization; sleep study; Colon surgery (2006-2008-2011); Cataract Surgery (Right, 02/13/14); and Esophagogastroduodenoscopy (egd) with propofol (N/A, 08/04/2016). Family: family history includes Heart disease in his father; Kidney disease in his father; Liver disease in his mother.  Constitutional Exam  General appearance: Well nourished, well developed, and well hydrated. In no apparent acute distress Vitals:   08/10/17 0940  BP: 137/76  Pulse: (!) 55  Resp: 16  Temp: 97.7 F (36.5 C)  TempSrc: Oral  SpO2: 98%  Weight: 240 lb (108.9 kg)  Height: '5\' 8"'  (1.727 m)   BMI Assessment: Estimated body mass index is 36.49 kg/m as calculated from the following:   Height as of this encounter: '5\' 8"'  (1.727 m).   Weight as of this encounter: 240 lb (108.9 kg).  BMI interpretation table: BMI level Category Range association with higher incidence of chronic pain  <18 kg/m2 Underweight   18.5-24.9 kg/m2 Ideal body weight   25-29.9 kg/m2 Overweight Increased incidence by 20%  30-34.9 kg/m2 Obese (Class I) Increased incidence by 68%  35-39.9 kg/m2 Severe obesity (Class II) Increased incidence by 136%  >40 kg/m2 Extreme obesity (Class III) Increased incidence by 254%   BMI Readings from Last 4 Encounters:  08/10/17 36.49 kg/m  07/19/17 36.52 kg/m  07/14/17 35.73 kg/m  06/29/17 36.49  kg/m   Wt Readings from Last 4 Encounters:  08/10/17 240 lb (108.9 kg)  07/19/17 240 lb 3.2 oz (109 kg)  07/14/17 235 lb (106.6 kg)  06/29/17 240 lb (108.9 kg)  Psych/Mental status: Alert, oriented x 3 (person, place, & time)       Eyes: PERLA Respiratory: No evidence of acute respiratory distress  Cervical Spine Area Exam  Skin & Axial Inspection: No masses, redness, edema, swelling, or associated skin lesions Alignment: Symmetrical Functional ROM: Unrestricted ROM      Stability: No instability detected Muscle Tone/Strength: Functionally intact. No obvious neuro-muscular anomalies detected. Sensory (Neurological): Unimpaired Palpation: No palpable anomalies              Upper Extremity (UE) Exam    Side: Right upper extremity  Side: Left upper extremity  Skin & Extremity Inspection: Skin color, temperature, and hair growth are WNL. No peripheral edema or cyanosis. No masses, redness, swelling, asymmetry, or associated skin lesions. No contractures.  Skin & Extremity Inspection: Skin color, temperature, and hair growth are WNL. No peripheral edema or cyanosis. No masses, redness, swelling, asymmetry, or associated skin lesions. No contractures.  Functional ROM: Unrestricted ROM          Functional ROM: Unrestricted ROM          Muscle Tone/Strength: Functionally intact. No obvious neuro-muscular anomalies detected.  Muscle Tone/Strength: Functionally intact. No obvious neuro-muscular anomalies detected.  Sensory (Neurological): Unimpaired          Sensory (Neurological): Unimpaired          Palpation: No palpable anomalies              Palpation: No palpable anomalies              Specialized Test(s): Deferred         Specialized Test(s): Deferred  Thoracic Spine Area Exam  Skin & Axial Inspection: No masses, redness, or swelling Alignment: Symmetrical Functional ROM: Unrestricted ROM Stability: No instability detected Muscle Tone/Strength: Functionally intact. No obvious  neuro-muscular anomalies detected. Sensory (Neurological): Unimpaired Muscle strength & Tone: No palpable anomalies  Lumbar Spine Area Exam  Skin & Axial Inspection: No masses, redness, or swelling Alignment: Symmetrical Functional ROM: Unrestricted ROM      Stability: No instability detected Muscle Tone/Strength: Functionally intact. No obvious neuro-muscular anomalies detected. Sensory (Neurological): Unimpaired Palpation: No palpable anomalies       Provocative Tests: Lumbar Hyperextension and rotation test: evaluation deferred today       Lumbar Lateral bending test: evaluation deferred today       Patrick's Maneuver: evaluation deferred today                    Gait & Posture Assessment  Ambulation: Unassisted Gait: Relatively normal for age and body habitus Posture: WNL   Lower Extremity Exam    Side: Right lower extremity  Side: Left lower extremity  Skin & Extremity Inspection: Skin color, temperature, and hair growth are WNL. No peripheral edema or cyanosis. No masses, redness, swelling, asymmetry, or associated skin lesions. No contractures.  Skin & Extremity Inspection: Skin color, temperature, and hair growth are WNL. No peripheral edema or cyanosis. No masses, redness, swelling, asymmetry, or associated skin lesions. No contractures.  Functional ROM: Unrestricted ROM          Functional ROM: Unrestricted ROM          Muscle Tone/Strength: Functionally intact. No obvious neuro-muscular anomalies detected.  Muscle Tone/Strength: Functionally intact. No obvious neuro-muscular anomalies detected.  Sensory (Neurological): Unimpaired  Sensory (Neurological): Unimpaired  Palpation: No palpable anomalies  Palpation: No palpable anomalies   Assessment  Primary Diagnosis & Pertinent Problem List: The primary encounter diagnosis was Chronic low back pain (Primary Area of Pain) (Bilateral) (L>R). Diagnoses of Lumbar facet syndrome (Bilateral) (L>R), Lumbar facet arthropathy  (Bilateral), and Lumbar facet osteoarthritis (Bilateral) were also pertinent to this visit.  Status Diagnosis  Controlled Controlled Controlled 1. Chronic low back pain (Primary Area of Pain) (Bilateral) (L>R)   2. Lumbar facet syndrome (Bilateral) (L>R)   3. Lumbar facet arthropathy (Bilateral)   4. Lumbar facet osteoarthritis (Bilateral)     Problems updated and reviewed during this visit: No problems updated. Plan of Care  Pharmacotherapy (Medications Ordered): No orders of the defined types were placed in this encounter.  Medications administered today: Irving A. Barabas had no medications administered during this visit.   Procedure Orders     LUMBAR FACET(MEDIAL BRANCH NERVE BLOCK) [facility] Lab Orders  No laboratory test(s) ordered today   Imaging Orders  No imaging studies ordered today   Referral Orders  No referral(s) requested today    Interventional management options: Planned, scheduled, and/or pending:   None at this time   Considering:   Diagnostic bilateral lumbar facet block #2 under fluoroscopic guidance and IV sedation  Possible bilateral lumbar facet RFA  Diagnostic left L5-S1 transforaminal epidural steroid injection  Possible left L5 nerve root ganglion RFA  Diagnostic left sided L4-5 interlaminar lumbar epidural steroid injection    Palliative PRN treatment(s):   Diagnostic bilateral lumbar facet block #2   Provider-requested follow-up: Return for PRN Procedure: (B) L-FCT Blk..  Future Appointments  Date Time Provider [place]  11/14/2017  8:15 AM [facility]  11/16/2017  8:30 AM Einar Pheasant, MD [facility]  05/25/2018  9:00 AM O'Brien-Blaney, Denisa L, LPN [facility]   Primary Care Physician: Einar Pheasant, MD Location: [facility] Note by: Gaspar Cola, MD Date: 08/10/2017; Time: 12:23 PM

## 2017-08-10 ENCOUNTER — Encounter: Payer: Self-pay | Admitting: Pain Medicine

## 2017-08-10 ENCOUNTER — Ambulatory Visit: Payer: Medicare Other | Attending: Pain Medicine | Admitting: Pain Medicine

## 2017-08-10 VITALS — BP 137/76 | HR 55 | Temp 97.7°F | Resp 16 | Ht 68.0 in | Wt 240.0 lb

## 2017-08-10 DIAGNOSIS — Z79899 Other long term (current) drug therapy: Secondary | ICD-10-CM | POA: Insufficient documentation

## 2017-08-10 DIAGNOSIS — Z7982 Long term (current) use of aspirin: Secondary | ICD-10-CM | POA: Insufficient documentation

## 2017-08-10 DIAGNOSIS — M5442 Lumbago with sciatica, left side: Secondary | ICD-10-CM

## 2017-08-10 DIAGNOSIS — E039 Hypothyroidism, unspecified: Secondary | ICD-10-CM | POA: Diagnosis not present

## 2017-08-10 DIAGNOSIS — I10 Essential (primary) hypertension: Secondary | ICD-10-CM | POA: Diagnosis not present

## 2017-08-10 DIAGNOSIS — M25552 Pain in left hip: Secondary | ICD-10-CM | POA: Diagnosis not present

## 2017-08-10 DIAGNOSIS — K2270 Barrett's esophagus without dysplasia: Secondary | ICD-10-CM | POA: Diagnosis not present

## 2017-08-10 DIAGNOSIS — G8929 Other chronic pain: Secondary | ICD-10-CM | POA: Diagnosis not present

## 2017-08-10 DIAGNOSIS — G894 Chronic pain syndrome: Secondary | ICD-10-CM | POA: Insufficient documentation

## 2017-08-10 DIAGNOSIS — G4730 Sleep apnea, unspecified: Secondary | ICD-10-CM | POA: Insufficient documentation

## 2017-08-10 DIAGNOSIS — M48061 Spinal stenosis, lumbar region without neurogenic claudication: Secondary | ICD-10-CM | POA: Insufficient documentation

## 2017-08-10 DIAGNOSIS — M47816 Spondylosis without myelopathy or radiculopathy, lumbar region: Secondary | ICD-10-CM

## 2017-08-10 DIAGNOSIS — E7800 Pure hypercholesterolemia, unspecified: Secondary | ICD-10-CM | POA: Insufficient documentation

## 2017-08-10 DIAGNOSIS — M25512 Pain in left shoulder: Secondary | ICD-10-CM | POA: Insufficient documentation

## 2017-08-10 DIAGNOSIS — E559 Vitamin D deficiency, unspecified: Secondary | ICD-10-CM | POA: Diagnosis not present

## 2017-08-10 DIAGNOSIS — M5416 Radiculopathy, lumbar region: Secondary | ICD-10-CM | POA: Insufficient documentation

## 2017-08-10 DIAGNOSIS — K219 Gastro-esophageal reflux disease without esophagitis: Secondary | ICD-10-CM | POA: Insufficient documentation

## 2017-08-10 DIAGNOSIS — D649 Anemia, unspecified: Secondary | ICD-10-CM | POA: Diagnosis not present

## 2017-08-10 DIAGNOSIS — M1990 Unspecified osteoarthritis, unspecified site: Secondary | ICD-10-CM | POA: Insufficient documentation

## 2017-08-10 NOTE — Progress Notes (Signed)
Safety precautions to be maintained throughout the outpatient stay will include: orient to surroundings, keep bed in low position, maintain call bell within reach at all times, provide assistance with transfer out of bed and ambulation.  

## 2017-08-10 NOTE — Patient Instructions (Signed)

## 2017-09-12 ENCOUNTER — Encounter: Payer: Medicare Other | Admitting: Nurse Practitioner

## 2017-10-12 ENCOUNTER — Telehealth: Payer: Self-pay | Admitting: Nurse Practitioner

## 2017-10-12 NOTE — Telephone Encounter (Signed)
ERROR msg was for his wife

## 2017-10-22 ENCOUNTER — Other Ambulatory Visit: Payer: Self-pay | Admitting: Internal Medicine

## 2017-10-27 ENCOUNTER — Ambulatory Visit (HOSPITAL_BASED_OUTPATIENT_CLINIC_OR_DEPARTMENT_OTHER): Payer: Medicare Other | Admitting: Pain Medicine

## 2017-10-27 ENCOUNTER — Other Ambulatory Visit: Payer: Self-pay

## 2017-10-27 ENCOUNTER — Encounter: Payer: Self-pay | Admitting: Pain Medicine

## 2017-10-27 ENCOUNTER — Ambulatory Visit
Admission: RE | Admit: 2017-10-27 | Discharge: 2017-10-27 | Disposition: A | Discharge status: Home / Self Care | Payer: Medicare Other | Source: Ambulatory Visit | Attending: Pain Medicine | Admitting: Pain Medicine

## 2017-10-27 VITALS — BP 154/91 | HR 64 | Temp 98.1°F | Resp 21 | Ht 68.0 in | Wt 245.0 lb

## 2017-10-27 DIAGNOSIS — Z79899 Other long term (current) drug therapy: Secondary | ICD-10-CM | POA: Insufficient documentation

## 2017-10-27 DIAGNOSIS — M5442 Lumbago with sciatica, left side: Secondary | ICD-10-CM | POA: Insufficient documentation

## 2017-10-27 DIAGNOSIS — Z79891 Long term (current) use of opiate analgesic: Secondary | ICD-10-CM | POA: Insufficient documentation

## 2017-10-27 DIAGNOSIS — G8929 Other chronic pain: Secondary | ICD-10-CM | POA: Insufficient documentation

## 2017-10-27 DIAGNOSIS — M47817 Spondylosis without myelopathy or radiculopathy, lumbosacral region: Secondary | ICD-10-CM | POA: Insufficient documentation

## 2017-10-27 DIAGNOSIS — Z9889 Other specified postprocedural states: Secondary | ICD-10-CM | POA: Insufficient documentation

## 2017-10-27 DIAGNOSIS — Z888 Allergy status to other drugs, medicaments and biological substances status: Secondary | ICD-10-CM | POA: Diagnosis not present

## 2017-10-27 DIAGNOSIS — M47816 Spondylosis without myelopathy or radiculopathy, lumbar region: Secondary | ICD-10-CM | POA: Insufficient documentation

## 2017-10-27 DIAGNOSIS — M549 Dorsalgia, unspecified: Secondary | ICD-10-CM | POA: Diagnosis present

## 2017-10-27 MED ORDER — FENTANYL CITRATE (PF) 100 MCG/2ML IJ SOLN
25.0000 ug | INTRAMUSCULAR | Status: DC | PRN
  Administered 2017-10-27: 100 ug via INTRAVENOUS
  Filled 2017-10-27: qty 2

## 2017-10-27 MED ORDER — TRIAMCINOLONE ACETONIDE 40 MG/ML IJ SUSP
80.0000 mg | Freq: Once | INTRAMUSCULAR | Status: AC
  Administered 2017-10-27: 40 mg
  Filled 2017-10-27: qty 2

## 2017-10-27 MED ORDER — ROPIVACAINE HCL 2 MG/ML IJ SOLN
18.0000 mL | Freq: Once | INTRAMUSCULAR | Status: AC
  Administered 2017-10-27: 10 mL via PERINEURAL
  Filled 2017-10-27: qty 20

## 2017-10-27 MED ORDER — ROPIVACAINE HCL 2 MG/ML IJ SOLN
INTRAMUSCULAR | Status: AC
  Filled 2017-10-27: qty 10

## 2017-10-27 MED ORDER — TRIAMCINOLONE ACETONIDE 40 MG/ML IJ SUSP
INTRAMUSCULAR | Status: AC
  Filled 2017-10-27: qty 1

## 2017-10-27 MED ORDER — LACTATED RINGERS IV SOLN
1000.0000 mL | Freq: Once | INTRAVENOUS | Status: AC
  Administered 2017-10-27: 1000 mL via INTRAVENOUS

## 2017-10-27 MED ORDER — MIDAZOLAM HCL 5 MG/5ML IJ SOLN
1.0000 mg | INTRAMUSCULAR | Status: DC | PRN
  Administered 2017-10-27: 2 mg via INTRAVENOUS
  Filled 2017-10-27: qty 5

## 2017-10-27 MED ORDER — LIDOCAINE HCL 2 % IJ SOLN
20.0000 mL | Freq: Once | INTRAMUSCULAR | Status: AC
  Administered 2017-10-27: 400 mg
  Filled 2017-10-27: qty 20

## 2017-10-27 NOTE — Patient Instructions (Signed)

## 2017-10-27 NOTE — Progress Notes (Signed)
Safety precautions to be maintained throughout the outpatient stay will include: orient to surroundings, keep bed in low position, maintain call bell within reach at all times, provide assistance with transfer out of bed and ambulation.  

## 2017-10-27 NOTE — Progress Notes (Signed)
Patient's Name: Hector [name]  MRN: [id number]  Referring Provider: Einar Pheasant, MD  DOB: 12-Nov-1934  PCP: Einar Pheasant, MD  DOS: 10/27/2017  Note by: Gaspar Cola, MD  Service setting: Ambulatory outpatient  Specialty: Interventional Pain Management  Patient type: Established  Location: [facility] (AMB) Pain Management Facility  Visit type: Interventional Procedure   Primary Reason for Visit: Interventional Pain Management Treatment. CC: Back Pain  Procedure:       Anesthesia, Analgesia, Anxiolysis:  Type: Lumbar Facet, Medial Branch Block(s)          Primary Purpose: Diagnostic Region: Posterolateral Lumbosacral Spine Level: L2, L3, L4, L5, & S1 Medial Branch Level(s). Injecting these levels blocks the L3-4, L4-5, and L5-S1 lumbar facet joints. Laterality: Bilateral  Type: Moderate (Conscious) Sedation combined with Local Anesthesia Indication(s): Analgesia and Anxiety Route: Intravenous (IV) IV Access: Secured Sedation: Meaningful verbal contact was maintained at all times during the procedure  Local Anesthetic: Lidocaine 1-2%   Indications: 1. Spondylosis without myelopathy or radiculopathy, lumbosacral region   2. Lumbar facet syndrome (Bilateral) (L>R)   3. Lumbar facet osteoarthritis (Bilateral)   4. Lumbar facet arthropathy (Bilateral)   5. Chronic low back pain (Primary Area of Pain) (Bilateral) (L>R)    Pain Score: Pre-procedure: 3 /10 Post-procedure: 0-No pain/10  Pre-op Assessment:  Mr. Caligiuri is a 83 y.o. (year old), male patient, seen today for interventional treatment. He  has a past surgical history that includes Prostate surgery; cystocopy (2003); Hemorrhoid surgery; Cardiac catheterization; sleep study; Colon surgery (2006-2008-2011); Cataract Surgery (Right, 02/13/14); and Esophagogastroduodenoscopy (egd) with propofol (N/A, 08/04/2016). Mr. Braver has a current medication list which includes the following prescription(s): acetaminophen, amlodipine, aspirin, calcium  plus d3 absorbable, cyclobenzaprine, fluticasone, hydrochlorothiazide, hyoscyamine, irbesartan, l-lysine, levothyroxine, omeprazole, tramadol, and valacyclovir, and the following Facility-Administered Medications: fentanyl and midazolam. His primarily concern today is the Back Pain  Initial Vital Signs:  Pulse Rate: 64 Temp: 97.7 F (36.5 C) Resp: 13 BP: (!) 171/75 SpO2: 98 %  BMI: Estimated body mass index is 37.25 kg/m as calculated from the following:   Height as of this encounter: 5\' 8"  (1.727 m).   Weight as of this encounter: 245 lb (111.1 kg).  Risk Assessment: Allergies: Reviewed. He is allergic to tape.  Allergy Precautions: None required Coagulopathies: Reviewed. None identified.  Blood-thinner therapy: None at this time Active Infection(s): Reviewed. None identified. Mr. Sedlacek is afebrile  Site Confirmation: Mr. Ghee was asked to confirm the procedure and laterality before marking the site Procedure checklist: Completed Consent: Before the procedure and under the influence of no sedative(s), amnesic(s), or anxiolytics, the patient was informed of the treatment options, risks and possible complications. To fulfill our ethical and legal obligations, as recommended by the American Medical Association's Code of Ethics, I have informed the patient of my clinical impression; the nature and purpose of the treatment or procedure; the risks, benefits, and possible complications of the intervention; the alternatives, including doing nothing; the risk(s) and benefit(s) of the alternative treatment(s) or procedure(s); and the risk(s) and benefit(s) of doing nothing. The patient was provided information about the general risks and possible complications associated with the procedure. These may include, but are not limited to: failure to achieve desired goals, infection, bleeding, organ or nerve damage, allergic reactions, paralysis, and death. In addition, the patient was informed of those  risks and complications associated to Spine-related procedures, such as failure to decrease pain; infection (i.e.: Meningitis, epidural or intraspinal abscess); bleeding (i.e.: epidural hematoma, subarachnoid  hemorrhage, or any other type of intraspinal or peri-dural bleeding); organ or nerve damage (i.e.: Any type of peripheral nerve, nerve root, or spinal cord injury) with subsequent damage to sensory, motor, and/or autonomic systems, resulting in permanent pain, numbness, and/or weakness of one or several areas of the body; allergic reactions; (i.e.: anaphylactic reaction); and/or death. Furthermore, the patient was informed of those risks and complications associated with the medications. These include, but are not limited to: allergic reactions (i.e.: anaphylactic or anaphylactoid reaction(s)); adrenal axis suppression; blood sugar elevation that in diabetics may result in ketoacidosis or comma; water retention that in patients with history of congestive heart failure may result in shortness of breath, pulmonary edema, and decompensation with resultant heart failure; weight gain; swelling or edema; medication-induced neural toxicity; particulate matter embolism and blood vessel occlusion with resultant organ, and/or nervous system infarction; and/or aseptic necrosis of one or more joints. Finally, the patient was informed that Medicine is not an exact science; therefore, there is also the possibility of unforeseen or unpredictable risks and/or possible complications that may result in a catastrophic outcome. The patient indicated having understood very clearly. We have given the patient no guarantees and we have made no promises. Enough time was given to the patient to ask questions, all of which were answered to the patient's satisfaction. Mr. Grandison has indicated that he wanted to continue with the procedure. Attestation: I, the ordering provider, attest that I have discussed with the patient the benefits,  risks, side-effects, alternatives, likelihood of achieving goals, and potential problems during recovery for the procedure that I have provided informed consent. Date  Time: 10/27/2017  1:10 PM  Pre-Procedure Preparation:  Monitoring: As per clinic protocol. Respiration, ETCO2, SpO2, BP, heart rate and rhythm monitor placed and checked for adequate function Safety Precautions: Patient was assessed for positional comfort and pressure points before starting the procedure. Time-out: I initiated and conducted the "Time-out" before starting the procedure, as per protocol. The patient was asked to participate by confirming the accuracy of the "Time Out" information. Verification of the correct person, site, and procedure were performed and confirmed by me, the nursing staff, and the patient. "Time-out" conducted as per Joint Commission's Universal Protocol (UP.01.01.01). Time: 1417  Description of Procedure:       Position: Prone Laterality: Bilateral. The procedure was performed in identical fashion on both sides. Levels:  L2, L3, L4, L5, & S1 Medial Branch Level(s) Area Prepped: Posterior Lumbosacral Region Prepping solution: ChloraPrep (2% chlorhexidine gluconate and 70% isopropyl alcohol) Safety Precautions: Aspiration looking for blood return was conducted prior to all injections. At no point did we inject any substances, as a needle was being advanced. Before injecting, the patient was told to immediately notify me if he was experiencing any new onset of "ringing in the ears, or metallic taste in the mouth". No attempts were made at seeking any paresthesias. Safe injection practices and needle disposal techniques used. Medications properly checked for expiration dates. SDV (single dose vial) medications used. After the completion of the procedure, all disposable equipment used was discarded in the proper designated medical waste containers. Local Anesthesia: Protocol guidelines were followed. The  patient was positioned over the fluoroscopy table. The area was prepped in the usual manner. The time-out was completed. The target area was identified using fluoroscopy. A 12-in long, straight, sterile hemostat was used with fluoroscopic guidance to locate the targets for each level blocked. Once located, the skin was marked with an approved surgical skin marker. Once  all sites were marked, the skin (epidermis, dermis, and hypodermis), as well as deeper tissues (fat, connective tissue and muscle) were infiltrated with a small amount of a short-acting local anesthetic, loaded on a 10cc syringe with a 25G, 1.5-in  Needle. An appropriate amount of time was allowed for local anesthetics to take effect before proceeding to the next step. Local Anesthetic: Lidocaine 2.0% The unused portion of the local anesthetic was discarded in the proper designated containers. Technical explanation of process:  L2 Medial Branch Nerve Block (MBB): The target area for the L2 medial branch is at the junction of the postero-lateral aspect of the superior articular process and the superior, posterior, and medial edge of the transverse process of L3. Under fluoroscopic guidance, a Quincke needle was inserted until contact was made with os over the superior postero-lateral aspect of the pedicular shadow (target area). After negative aspiration for blood, 0.5 mL of the nerve block solution was injected without difficulty or complication. The needle was removed intact. L3 Medial Branch Nerve Block (MBB): The target area for the L3 medial branch is at the junction of the postero-lateral aspect of the superior articular process and the superior, posterior, and medial edge of the transverse process of L4. Under fluoroscopic guidance, a Quincke needle was inserted until contact was made with os over the superior postero-lateral aspect of the pedicular shadow (target area). After negative aspiration for blood, 0.5 mL of the nerve block solution  was injected without difficulty or complication. The needle was removed intact. L4 Medial Branch Nerve Block (MBB): The target area for the L4 medial branch is at the junction of the postero-lateral aspect of the superior articular process and the superior, posterior, and medial edge of the transverse process of L5. Under fluoroscopic guidance, a Quincke needle was inserted until contact was made with os over the superior postero-lateral aspect of the pedicular shadow (target area). After negative aspiration for blood, 0.5 mL of the nerve block solution was injected without difficulty or complication. The needle was removed intact. L5 Medial Branch Nerve Block (MBB): The target area for the L5 medial branch is at the junction of the postero-lateral aspect of the superior articular process and the superior, posterior, and medial edge of the sacral ala. Under fluoroscopic guidance, a Quincke needle was inserted until contact was made with os over the superior postero-lateral aspect of the pedicular shadow (target area). After negative aspiration for blood, 0.5 mL of the nerve block solution was injected without difficulty or complication. The needle was removed intact. S1 Medial Branch Nerve Block (MBB): The target area for the S1 medial branch is at the posterior and inferior 6 o'clock position of the L5-S1 facet joint. Under fluoroscopic guidance, the Quincke needle inserted for the L5 MBB was redirected until contact was made with os over the inferior and postero aspect of the sacrum, at the 6 o' clock position under the L5-S1 facet joint (Target area). After negative aspiration for blood, 0.5 mL of the nerve block solution was injected without difficulty or complication. The needle was removed intact. Procedural Needles: 22-gauge, 3.5-inch, Quincke needles used for all levels. Nerve block solution: 0.2% PF-Ropivacaine + Triamcinolone (40 mg/mL) diluted to a final concentration of 4 mg of Triamcinolone/mL of  Ropivacaine The unused portion of the solution was discarded in the proper designated containers.  Once the entire procedure was completed, the treated area was cleaned, making sure to leave some of the prepping solution back to take advantage of its long  term bactericidal properties.   Illustration of the posterior view of the lumbar spine and the posterior neural structures. Laminae of L2 through S1 are labeled. DPRL5, dorsal primary ramus of L5; DPRS1, dorsal primary ramus of S1; DPR3, dorsal primary ramus of L3; FJ, facet (zygapophyseal) joint L3-L4; I, inferior articular process of L4; LB1, lateral branch of dorsal primary ramus of L1; IAB, inferior articular branches from L3 medial branch (supplies L4-L5 facet joint); IBP, intermediate branch plexus; MB3, medial branch of dorsal primary ramus of L3; NR3, third lumbar nerve root; S, superior articular process of L5; SAB, superior articular branches from L4 (supplies L4-5 facet joint also); TP3, transverse process of L3.  Vitals:   10/27/17 1434 10/27/17 1446 10/27/17 1456 10/27/17 1507  BP: (!) 155/84 (!) 147/74 (!) 144/74 (!) 154/91  Pulse:      Resp: 13 13 13  (!) 21  Temp:  98.1 F (36.7 C)    TempSrc:  Temporal    SpO2: 97% 93% 98% 99%  Weight:      Height:        Start Time: 1417 hrs. End Time: 1431 hrs.  Imaging Guidance (Spinal):  Type of Imaging Technique: Fluoroscopy Guidance (Spinal) Indication(s): Assistance in needle guidance and placement for procedures requiring needle placement in or near specific anatomical locations not easily accessible without such assistance. Exposure Time: Please see nurses notes. Contrast: None used. Fluoroscopic Guidance: I was personally present during the use of fluoroscopy. "Tunnel Vision Technique" used to obtain the best possible view of the target area. Parallax error corrected before commencing the procedure. "Direction-depth-direction" technique used to introduce the needle under  continuous pulsed fluoroscopy. Once target was reached, antero-posterior, oblique, and lateral fluoroscopic projection used confirm needle placement in all planes. Images permanently stored in EMR. Interpretation: No contrast injected. I personally interpreted the imaging intraoperatively. Adequate needle placement confirmed in multiple planes. Permanent images saved into the patient's record.  Antibiotic Prophylaxis:   Anti-infectives (From admission, onward)   None     Indication(s): None identified  Post-operative Assessment:  Post-procedure Vital Signs:  Pulse Rate: 64 Temp: 98.1 F (36.7 C) Resp: (!) 21 BP: (!) 154/91 SpO2: 99 %  EBL: None  Complications: No immediate post-treatment complications observed by team, or reported by patient.  Note: The patient tolerated the entire procedure well. A repeat set of vitals were taken after the procedure and the patient was kept under observation following institutional policy, for this type of procedure. Post-procedural neurological assessment was performed, showing return to baseline, prior to discharge. The patient was provided with post-procedure discharge instructions, including a section on how to identify potential problems. Should any problems arise concerning this procedure, the patient was given instructions to immediately contact us, at any time, without hesitation. In any case, we plan to contact the patient by telephone for a follow-up status report regarding this interventional procedure.  Comments:  No additional relevant information.  Plan of Care    Imaging Orders     DG C-Arm 1-60 Min-No Report  Procedure Orders     LUMBAR FACET(MEDIAL BRANCH NERVE BLOCK) MBNB  Medications ordered for procedure: Meds ordered this encounter  Medications  . lidocaine (XYLOCAINE) 2 % (with pres) injection 400 mg  . midazolam (VERSED) 5 MG/5ML injection 1-2 mg    Make sure Flumazenil is available in the pyxis when using this  medication. If oversedation occurs, administer 0.2 mg IV over 15 sec. If after 45 sec no response, administer 0.2 mg again over  1 min; may repeat at 1 min intervals; not to exceed 4 doses (1 mg)  . fentaNYL (SUBLIMAZE) injection 25-50 mcg    Make sure Narcan is available in the pyxis when using this medication. In the event of respiratory depression (RR< 8/min): Titrate NARCAN (naloxone) in increments of 0.1 to 0.2 mg IV at 2-3 minute intervals, until desired degree of reversal.  . lactated ringers infusion 1,000 mL  . ropivacaine (PF) 2 mg/mL (0.2%) (NAROPIN) injection 18 mL  . triamcinolone acetonide (KENALOG-40) injection 80 mg   Medications administered: We administered lidocaine, midazolam, fentaNYL, lactated ringers, ropivacaine (PF) 2 mg/mL (0.2%), and triamcinolone acetonide.  See the medical record for exact dosing, route, and time of administration.  New Prescriptions   No medications on file   Disposition: Discharge home  Discharge Date & Time: 10/27/2017; 1508 hrs.   Physician-requested Follow-up: Return for post-procedure eval (2 wks), w/ Dr. Dossie Arbour.  Future Appointments  Date Time Provider [place]  11/14/2017  8:15 AM [facility]  11/16/2017  8:30 AM Einar Pheasant, MD [facility]  11/21/2017  1:15 PM Milinda Pointer, MD [facility] None  05/25/2018  9:00 AM O'Brien-Blaney, Bryson Corona, LPN [facility]   Primary Care Physician: Einar Pheasant, MD Location: [facility] Note by: Gaspar Cola, MD Date: 10/27/2017; Time: 4:19 PM  Disclaimer:  Medicine is not an Chief Strategy Officer. The only guarantee in medicine is that nothing is guaranteed. It is important to note that the decision to proceed with this intervention was based on the information collected from the patient. The Data and conclusions were drawn from the patient's questionnaire, the interview, and the physical examination. Because the information was  provided in large part by the patient, it cannot be guaranteed that it has not been purposely or unconsciously manipulated. Every effort has been made to obtain as much relevant data as possible for this evaluation. It is important to note that the conclusions that lead to this procedure are derived in large part from the available data. Always take into account that the treatment will also be dependent on availability of resources and existing treatment guidelines, considered by other Pain Management Practitioners as being common knowledge and practice, at the time of the intervention. For Medico-Legal purposes, it is also important to point out that variation in procedural techniques and pharmacological choices are the acceptable norm. The indications, contraindications, technique, and results of the above procedure should only be interpreted and judged by a Board-Certified Interventional Pain Specialist with extensive familiarity and expertise in the same exact procedure and technique.

## 2017-10-28 ENCOUNTER — Telehealth: Payer: Self-pay

## 2017-10-28 NOTE — Telephone Encounter (Signed)
Post procedure phone call.  Left message.  

## 2017-11-14 ENCOUNTER — Other Ambulatory Visit (INDEPENDENT_AMBULATORY_CARE_PROVIDER_SITE_OTHER): Payer: Medicare Other

## 2017-11-14 DIAGNOSIS — I10 Essential (primary) hypertension: Secondary | ICD-10-CM | POA: Diagnosis not present

## 2017-11-14 DIAGNOSIS — R739 Hyperglycemia, unspecified: Secondary | ICD-10-CM | POA: Diagnosis not present

## 2017-11-14 LAB — CBC WITH DIFFERENTIAL/PLATELET
BASOS PCT: 0.6 % (ref 0.0–3.0)
Basophils Absolute: 0 10*3/uL (ref 0.0–0.1)
EOS ABS: 0.1 10*3/uL (ref 0.0–0.7)
Eosinophils Relative: 1.5 % (ref 0.0–5.0)
HCT: 45.3 % (ref 39.0–52.0)
Hemoglobin: 15.3 g/dL (ref 13.0–17.0)
LYMPHS ABS: 2.2 10*3/uL (ref 0.7–4.0)
Lymphocytes Relative: 32.1 % (ref 12.0–46.0)
MCHC: 33.8 g/dL (ref 30.0–36.0)
MCV: 94.2 fl (ref 78.0–100.0)
Monocytes Absolute: 0.7 10*3/uL (ref 0.1–1.0)
Monocytes Relative: 10.6 % (ref 3.0–12.0)
NEUTROS ABS: 3.8 10*3/uL (ref 1.4–7.7)
Neutrophils Relative %: 55.2 % (ref 43.0–77.0)
PLATELETS: 226 10*3/uL (ref 150.0–400.0)
RBC: 4.81 Mil/uL (ref 4.22–5.81)
RDW: 13.2 % (ref 11.5–15.5)
WBC: 6.8 10*3/uL (ref 4.0–10.5)

## 2017-11-14 LAB — LIPID PANEL
CHOLESTEROL: 185 mg/dL (ref 0–200)
HDL: 61.3 mg/dL (ref >39.00)
LDL Cholesterol: 102 mg/dL — ABNORMAL HIGH (ref 0–99)
NonHDL: 123.47
TRIGLYCERIDES: 108 mg/dL (ref 0.0–149.0)
Total CHOL/HDL Ratio: 3
VLDL: 21.6 mg/dL (ref 0.0–40.0)

## 2017-11-14 LAB — HEMOGLOBIN A1C: Hgb A1c MFr Bld: 5.4 % (ref 4.6–6.5)

## 2017-11-14 LAB — BASIC METABOLIC PANEL
BUN: 18 mg/dL (ref 6–23)
CHLORIDE: 102 meq/L (ref 96–112)
CO2: 31 meq/L (ref 19–32)
CREATININE: 1.31 mg/dL (ref 0.40–1.50)
Calcium: 9.6 mg/dL (ref 8.4–10.5)
GFR: 55.9 mL/min — ABNORMAL LOW (ref >60.00)
GLUCOSE: 91 mg/dL (ref 70–99)
Potassium: 4 mEq/L (ref 3.5–5.1)
Sodium: 141 mEq/L (ref 135–145)

## 2017-11-14 LAB — HEPATIC FUNCTION PANEL
ALT: 32 U/L (ref 0–53)
AST: 24 U/L (ref 0–37)
Albumin: 4 g/dL (ref 3.5–5.2)
Alkaline Phosphatase: 47 U/L (ref 39–117)
BILIRUBIN TOTAL: 0.8 mg/dL (ref 0.2–1.2)
Bilirubin, Direct: 0.1 mg/dL (ref 0.0–0.3)
TOTAL PROTEIN: 7 g/dL (ref 6.0–8.3)

## 2017-11-15 ENCOUNTER — Encounter: Payer: Self-pay | Admitting: Internal Medicine

## 2017-11-16 ENCOUNTER — Ambulatory Visit: Payer: Medicare Other | Admitting: Internal Medicine

## 2017-11-16 ENCOUNTER — Encounter: Payer: Self-pay | Admitting: Internal Medicine

## 2017-11-16 DIAGNOSIS — E559 Vitamin D deficiency, unspecified: Secondary | ICD-10-CM | POA: Diagnosis not present

## 2017-11-16 DIAGNOSIS — M47816 Spondylosis without myelopathy or radiculopathy, lumbar region: Secondary | ICD-10-CM | POA: Diagnosis not present

## 2017-11-16 DIAGNOSIS — K2270 Barrett's esophagus without dysplasia: Secondary | ICD-10-CM | POA: Diagnosis not present

## 2017-11-16 DIAGNOSIS — Z6837 Body mass index (BMI) 37.0-37.9, adult: Secondary | ICD-10-CM

## 2017-11-16 DIAGNOSIS — Z8546 Personal history of malignant neoplasm of prostate: Secondary | ICD-10-CM | POA: Diagnosis not present

## 2017-11-16 DIAGNOSIS — E7800 Pure hypercholesterolemia, unspecified: Secondary | ICD-10-CM | POA: Diagnosis not present

## 2017-11-16 DIAGNOSIS — I10 Essential (primary) hypertension: Secondary | ICD-10-CM | POA: Diagnosis not present

## 2017-11-16 DIAGNOSIS — C439 Malignant melanoma of skin, unspecified: Secondary | ICD-10-CM | POA: Diagnosis not present

## 2017-11-16 MED ORDER — ROSUVASTATIN CALCIUM 10 MG PO TABS: 10.0000 mg | ORAL_TABLET | Freq: Every day | ORAL | 2 refills | Status: DC

## 2017-11-16 MED ORDER — AMLODIPINE BESYLATE 5 MG PO TABS: 5.0000 mg | ORAL_TABLET | Freq: Every day | ORAL | 5 refills | Status: DC

## 2017-11-16 MED ORDER — IRBESARTAN 300 MG PO TABS: 300.0000 mg | ORAL_TABLET | Freq: Every day | ORAL | 5 refills | Status: DC

## 2017-11-16 MED ORDER — HYDROCHLOROTHIAZIDE 25 MG PO TABS: 25.0000 mg | ORAL_TABLET | Freq: Every day | ORAL | 5 refills | Status: DC

## 2017-11-16 NOTE — Progress Notes (Signed)
Subjective:    [name], male    DOB: March 24, 1935, 83 y.o.   MRN: [id number]  HPI  Patient here for a scheduled follow up.  Has chronic back pain as outlined.  Followed by pain clinic.  S/p lumbar facet, medial branch block 10/27/17.  Has helped.  He reports otherwise he is doing well.  No chest pain. No sob.  No acid reflux.  No abdominal pain.  Bowels moving.  No urine change.  Outside blood pressures reviewed.  Blood pressures averaging 130s/70s.  Discussed lab results.  Discussed calculated cholesterol risk.  Discussed starting a cholesterol medication.     Past Medical History:  Diagnosis Date  . Anemia   . Arthritis   . Cancer ([place])    prostate,skin  . Chicken pox   . Diverticulitis   . Dysrhythmia   . GERD (gastroesophageal reflux disease)   . Hyperlipidemia   . Hypertension   . Hypothyroidism   . Melanoma ([place])    Malignant resection  . Sleep apnea   . Ulcer    Past Surgical History:  Procedure Laterality Date  . CARDIAC CATHETERIZATION    . Cataract Surgery Right 02/13/14  . COLON SURGERY  2006-2008-2011   polyps removed  . cystocopy  2003  . ESOPHAGOGASTRODUODENOSCOPY (EGD) WITH PROPOFOL N/A 08/04/2016   Procedure: ESOPHAGOGASTRODUODENOSCOPY (EGD) WITH PROPOFOL;  Surgeon: Manya Silvas, MD;  Location: [facility] ENDOSCOPY;  Service: Endoscopy;  Laterality: N/A;  . HEMORRHOID SURGERY    . PROSTATE SURGERY    . sleep study     Family History  Problem Relation Age of Onset  . Liver disease Mother   . Heart disease Father   . Kidney disease Father    Social History   Socioeconomic History  . Marital status: Married    Spouse name: Not on file  . Number of children: Not on file  . Years of education: Not on file  . Highest education level: Not on file  Occupational History  . Not on file  Social Needs  . Financial resource strain: Not on file  . Food insecurity:    Worry: Not on file    Inability: Not on file  . Transportation needs:   Medical: Not on file    Non-medical: Not on file  Tobacco Use  . Smoking status: Never Smoker  . Smokeless tobacco: Never Used  Substance and Sexual Activity  . Alcohol use: No    Alcohol/week: 0.0 oz  . Drug use: No  . Sexual activity: Never  Lifestyle  . Physical activity:    Days per week: Not on file    Minutes per session: Not on file  . Stress: Not on file  Relationships  . Social connections:    Talks on phone: Not on file    Gets together: Not on file    Attends religious service: Not on file    Active member of club or organization: Not on file    Attends meetings of clubs or organizations: Not on file    Relationship status: Not on file  Other Topics Concern  . Not on file  Social History Narrative  . Not on file    Outpatient Encounter Medications as of 11/16/2017  Medication Sig  . acetaminophen (TYLENOL) 500 MG tablet Take 500 mg by mouth every 6 (six) hours as needed.  Marland Kitchen amLODipine (NORVASC) 5 MG tablet Take 1 tablet (5 mg total) by mouth daily.  Marland Kitchen aspirin 81  MG tablet Take 81 mg by mouth daily.  . fluticasone (FLONASE) 50 MCG/ACT nasal spray TWO PUFFS IN EACH NOSTRIL ONCE A DAY  . hydrochlorothiazide (HYDRODIURIL) 25 MG tablet Take 1 tablet (25 mg total) by mouth daily.  . hyoscyamine (LEVSIN, ANASPAZ) 0.125 MG tablet Take 0.125 mg by mouth as needed.  . irbesartan (AVAPRO) 300 MG tablet Take 1 tablet (300 mg total) by mouth daily.  Marland Kitchen L-Lysine 500 MG TABS Take by mouth daily.  Marland Kitchen levothyroxine (SYNTHROID, LEVOTHROID) 88 MCG tablet TAKE 1 TABLET BY MOUTH DAILY  . omeprazole (PRILOSEC) 20 MG capsule TAKE ONE CAPSULE BY MOUTH TWICE A DAY  . valACYclovir (VALTREX) 1000 MG tablet TAKE 1 TABLET BY MOUTH EVERY DAY  . [DISCONTINUED] amLODipine (NORVASC) 5 MG tablet TAKE 1 TABLET BY MOUTH EVERY DAY  . [DISCONTINUED] hydrochlorothiazide (HYDRODIURIL) 25 MG tablet TAKE 1 TABLET BY MOUTH EVERY DAY  . [DISCONTINUED] irbesartan (AVAPRO) 300 MG tablet TAKE 1 TABLET BY MOUTH  EVERY DAY  . Calcium Carb-Cholecalciferol (CALCIUM PLUS D3 ABSORBABLE) [phone] MG-UNIT CAPS Take 1 capsule by mouth daily with breakfast.  . cyclobenzaprine (FLEXERIL) 5 MG tablet Take 1 tablet (5 mg total) by mouth at bedtime.  . rosuvastatin (CRESTOR) 10 MG tablet Take 1 tablet (10 mg total) by mouth daily.  . traMADol (ULTRAM) 50 MG tablet Take 1 tablet (50 mg total) by mouth every 8 (eight) hours as needed for severe pain.   No facility-administered encounter medications on file as of 11/16/2017.     Review of Systems  Constitutional: Negative for appetite change and unexpected weight change.  HENT: Negative for congestion and sinus pressure.   Respiratory: Negative for cough, chest tightness and shortness of breath.   Cardiovascular: Negative for chest pain, palpitations and leg swelling.  Gastrointestinal: Negative for abdominal pain, diarrhea, nausea and vomiting.  Genitourinary: Negative for difficulty urinating and dysuria.  Musculoskeletal: Negative for joint swelling and myalgias.  Skin: Negative for color change and rash.  Neurological: Negative for dizziness, light-headedness and headaches.  Psychiatric/Behavioral: Negative for agitation and dysphoric mood.       Objective:     Blood pressure rechecked by me:  136/78  Physical Exam  Constitutional: He appears well-developed and well-nourished. No distress.  HENT:  Nose: Nose normal.  Mouth/Throat: Oropharynx is clear and moist.  Neck: Neck supple. No thyromegaly present.  Cardiovascular: Normal rate and regular rhythm.  Pulmonary/Chest: Effort normal and breath sounds normal. No respiratory distress.  Abdominal: Soft. Bowel sounds are normal. There is no tenderness.  Musculoskeletal: He exhibits no edema or tenderness.  Lymphadenopathy:    He has no cervical adenopathy.  Skin: No rash noted. No erythema.  Psychiatric: He has a normal mood and affect. His behavior is normal.    BP 136/78   Pulse (!) 59   Temp  (!) 97.4 F (36.3 C) (Oral)   Resp 18   Wt 245 lb (111.1 kg)   SpO2 98%   BMI 37.25 kg/m  Wt Readings from Last 3 Encounters:  11/16/17 245 lb (111.1 kg)  10/27/17 245 lb (111.1 kg)  08/10/17 240 lb (108.9 kg)     Lab Results  Component Value Date   WBC 6.8 11/14/2017   HGB 15.3 11/14/2017   HCT 45.3 11/14/2017   PLT 226.0 11/14/2017   GLUCOSE 91 11/14/2017   CHOL 185 11/14/2017   TRIG 108.0 11/14/2017   HDL 61.30 11/14/2017   LDLDIRECT 111.0 02/24/2017   LDLCALC 102 (H) 11/14/2017  ALT 32 11/14/2017   AST 24 11/14/2017   NA 141 11/14/2017   K 4.0 11/14/2017   CL 102 11/14/2017   CREATININE 1.31 11/14/2017   BUN 18 11/14/2017   CO2 31 11/14/2017   TSH 2.86 11/25/2016   PSA <0.01 Repeated and verified X2. (L) 07/13/2017   HGBA1C 5.4 11/14/2017    Dg C-arm 1-60 Min-no Report  Result Date: 10/27/2017 Fluoroscopy was utilized by the requesting physician.  No radiographic interpretation.      Assessment & Plan:   Problem List Items Addressed This Visit    Barrett's esophagus    Followed by GI.  Last EGD 08/04/16.  sympotms controlled.        BMI 37.0-37.9, adult    Discussed diet and exercise.  Follow.       History of prostate cancer    S/p prostatectomy.  Follow psa.       Hypercholesterolemia    Reviewed cholesterol labs.  Discussed his calculated risk (39%).  Discussed the need to start cholesterol medication.  Agreeable.  Start crestor 10mg  q day.  Check liver panel in 6 weeks.        Relevant Medications   hydrochlorothiazide (HYDRODIURIL) 25 MG tablet   irbesartan (AVAPRO) 300 MG tablet   amLODipine (NORVASC) 5 MG tablet   rosuvastatin (CRESTOR) 10 MG tablet   Other Relevant Orders   Hepatic function panel   Hepatic function panel   Hypertension    Blood pressure on recheck today improved.  Outside blood pressure checks under good control.  Follow pressures.  Follow metabolic panel.        Relevant Medications   hydrochlorothiazide  (HYDRODIURIL) 25 MG tablet   irbesartan (AVAPRO) 300 MG tablet   amLODipine (NORVASC) 5 MG tablet   rosuvastatin (CRESTOR) 10 MG tablet   Malignant melanoma ([place])    Followed by dermatology.        Osteoarthritis of lumbar spine (Chronic)    S/p recent block.  Followed by pain clinic.  Doing some better.  Follow.       Vitamin D deficiency    Follow vitamin d level.            Einar Pheasant, MD

## 2017-11-16 NOTE — Assessment & Plan Note (Signed)
Reviewed cholesterol labs.  Discussed his calculated risk (39%).  Discussed the need to start cholesterol medication.  Agreeable.  Start crestor 10mg  q day.  Check liver panel in 6 weeks.

## 2017-11-19 ENCOUNTER — Encounter: Payer: Self-pay | Admitting: Internal Medicine

## 2017-11-19 NOTE — Assessment & Plan Note (Signed)
Followed by dermatology

## 2017-11-19 NOTE — Assessment & Plan Note (Signed)
Followed by GI.  Last EGD 08/04/16.  sympotms controlled.

## 2017-11-19 NOTE — Assessment & Plan Note (Signed)
Discussed diet and exercise.  Follow.  

## 2017-11-19 NOTE — Assessment & Plan Note (Signed)
Blood pressure on recheck today improved.  Outside blood pressure checks under good control.  Follow pressures.  Follow metabolic panel.

## 2017-11-19 NOTE — Assessment & Plan Note (Signed)
Follow vitamin d level.   

## 2017-11-19 NOTE — Assessment & Plan Note (Signed)
S/p prostatectomy.  Follow psa.  

## 2017-11-19 NOTE — Assessment & Plan Note (Signed)
S/p recent block.  Followed by pain clinic.  Doing some better.  Follow.

## 2017-11-21 ENCOUNTER — Other Ambulatory Visit: Payer: Self-pay

## 2017-11-21 ENCOUNTER — Encounter: Payer: Self-pay | Admitting: Pain Medicine

## 2017-11-21 ENCOUNTER — Ambulatory Visit: Payer: Medicare Other | Attending: Pain Medicine | Admitting: Pain Medicine

## 2017-11-21 VITALS — BP 151/67 | HR 63 | Temp 97.7°F | Resp 18 | Ht 68.0 in | Wt 245.0 lb

## 2017-11-21 DIAGNOSIS — K2270 Barrett's esophagus without dysplasia: Secondary | ICD-10-CM | POA: Diagnosis not present

## 2017-11-21 DIAGNOSIS — I10 Essential (primary) hypertension: Secondary | ICD-10-CM | POA: Diagnosis not present

## 2017-11-21 DIAGNOSIS — Z8546 Personal history of malignant neoplasm of prostate: Secondary | ICD-10-CM | POA: Insufficient documentation

## 2017-11-21 DIAGNOSIS — M5416 Radiculopathy, lumbar region: Secondary | ICD-10-CM | POA: Diagnosis not present

## 2017-11-21 DIAGNOSIS — M5442 Lumbago with sciatica, left side: Secondary | ICD-10-CM | POA: Diagnosis not present

## 2017-11-21 DIAGNOSIS — M48061 Spinal stenosis, lumbar region without neurogenic claudication: Secondary | ICD-10-CM | POA: Diagnosis not present

## 2017-11-21 DIAGNOSIS — Z7982 Long term (current) use of aspirin: Secondary | ICD-10-CM | POA: Insufficient documentation

## 2017-11-21 DIAGNOSIS — G8929 Other chronic pain: Secondary | ICD-10-CM

## 2017-11-21 DIAGNOSIS — M545 Low back pain: Secondary | ICD-10-CM | POA: Insufficient documentation

## 2017-11-21 DIAGNOSIS — M79605 Pain in left leg: Secondary | ICD-10-CM | POA: Insufficient documentation

## 2017-11-21 DIAGNOSIS — Z86010 Personal history of colonic polyps: Secondary | ICD-10-CM | POA: Insufficient documentation

## 2017-11-21 DIAGNOSIS — Z79899 Other long term (current) drug therapy: Secondary | ICD-10-CM | POA: Insufficient documentation

## 2017-11-21 DIAGNOSIS — E785 Hyperlipidemia, unspecified: Secondary | ICD-10-CM | POA: Insufficient documentation

## 2017-11-21 DIAGNOSIS — M25552 Pain in left hip: Secondary | ICD-10-CM | POA: Insufficient documentation

## 2017-11-21 DIAGNOSIS — D649 Anemia, unspecified: Secondary | ICD-10-CM | POA: Insufficient documentation

## 2017-11-21 DIAGNOSIS — E7800 Pure hypercholesterolemia, unspecified: Secondary | ICD-10-CM | POA: Insufficient documentation

## 2017-11-21 DIAGNOSIS — M25512 Pain in left shoulder: Secondary | ICD-10-CM | POA: Insufficient documentation

## 2017-11-21 DIAGNOSIS — M7918 Myalgia, other site: Secondary | ICD-10-CM | POA: Insufficient documentation

## 2017-11-21 DIAGNOSIS — E559 Vitamin D deficiency, unspecified: Secondary | ICD-10-CM | POA: Diagnosis not present

## 2017-11-21 DIAGNOSIS — K219 Gastro-esophageal reflux disease without esophagitis: Secondary | ICD-10-CM | POA: Diagnosis not present

## 2017-11-21 DIAGNOSIS — M47896 Other spondylosis, lumbar region: Secondary | ICD-10-CM | POA: Insufficient documentation

## 2017-11-21 DIAGNOSIS — G894 Chronic pain syndrome: Secondary | ICD-10-CM | POA: Diagnosis not present

## 2017-11-21 DIAGNOSIS — E039 Hypothyroidism, unspecified: Secondary | ICD-10-CM | POA: Insufficient documentation

## 2017-11-21 DIAGNOSIS — Z79890 Hormone replacement therapy (postmenopausal): Secondary | ICD-10-CM | POA: Diagnosis not present

## 2017-11-21 DIAGNOSIS — M4317 Spondylolisthesis, lumbosacral region: Secondary | ICD-10-CM | POA: Diagnosis not present

## 2017-11-21 DIAGNOSIS — M47816 Spondylosis without myelopathy or radiculopathy, lumbar region: Secondary | ICD-10-CM

## 2017-11-21 DIAGNOSIS — Z888 Allergy status to other drugs, medicaments and biological substances status: Secondary | ICD-10-CM | POA: Insufficient documentation

## 2017-11-21 DIAGNOSIS — G4730 Sleep apnea, unspecified: Secondary | ICD-10-CM | POA: Diagnosis not present

## 2017-11-21 DIAGNOSIS — M47817 Spondylosis without myelopathy or radiculopathy, lumbosacral region: Secondary | ICD-10-CM

## 2017-11-21 MED ORDER — CYCLOBENZAPRINE HCL 5 MG PO TABS: 5.0000 mg | ORAL_TABLET | Freq: Every day | ORAL | 5 refills | Status: DC

## 2017-11-21 MED ORDER — TRAMADOL HCL 50 MG PO TABS: 50.0000 mg | ORAL_TABLET | Freq: Three times a day (TID) | ORAL | 5 refills | Status: DC | PRN

## 2017-11-21 NOTE — Patient Instructions (Addendum)
____________________________________________________________________________________________  Preparing for Procedure with Sedation  Instructions: . Oral Intake: Do not eat or drink anything for at least 8 hours prior to your procedure. . Transportation: Public transportation is not allowed. Bring an adult driver. The driver must be physically present in our waiting room before any procedure can be started. Marland Kitchen Physical Assistance: Bring an adult physically capable of assisting you, in the event you need help. This adult should keep you company at home for at least 6 hours after the procedure. . Blood Pressure Medicine: Take your blood pressure medicine with a sip of water the morning of the procedure. . Blood thinners:  . Diabetics on insulin: Notify the staff so that you can be scheduled 1st case in the morning. If your diabetes requires high dose insulin, take only  of your normal insulin dose the morning of the procedure and notify the staff that you have done so. . Preventing infections: Shower with an antibacterial soap the morning of your procedure. . Build-up your immune system: Take 1000 mg of Vitamin C with every meal (3 times a day) the day prior to your procedure. Marland Kitchen Antibiotics: Inform the staff if you have a condition or reason that requires you to take antibiotics before dental procedures. . Pregnancy: If you are pregnant, call and cancel the procedure. . Sickness: If you have a cold, fever, or any active infections, call and cancel the procedure. . Arrival: You must be in the facility at least 30 minutes prior to your scheduled procedure. . Children: Do not bring children with you. . Dress appropriately: Bring dark clothing that you would not mind if they get stained. . Valuables: Do not bring any jewelry or valuables.  Procedure appointments are reserved for interventional treatments only. Marland Kitchen No Prescription Refills. . No medication changes will be discussed during procedure  appointments. . No disability issues will be discussed.  Remember:  Regular Business hours are:  Monday to Thursday 8:00 AM to 4:00 PM  Provider's Schedule: Milinda Pointer, MD:  Procedure days: Tuesday and Thursday 7:30 AM to 4:00 PM  Gillis Santa, MD:  Procedure days: Monday and Wednesday 7:30 AM to 4:00 PM ____________________________________________________________________________________________ Dennis Bast were given prescriptions for Tramadol and Flexeril today. Radiofrequency Lesioning Radiofrequency lesioning is a procedure that is performed to relieve pain. The procedure is often used for back, neck, or arm pain. Radiofrequency lesioning involves the use of a machine that creates radio waves to make heat. During the procedure, the heat is applied to the nerve that carries the pain signal. The heat damages the nerve and interferes with the pain signal. Pain relief usually starts about 2 weeks after the procedure and lasts for 6 months to 1 year. Tell a health care provider about:  Any allergies you have.  All medicines you are taking, including vitamins, herbs, eye drops, creams, and over-the-counter medicines.  Any problems you or family members have had with anesthetic medicines.  Any blood disorders you have.  Any surgeries you have had.  Any medical conditions you have.  Whether you are pregnant or may be pregnant. What are the risks? Generally, this is a safe procedure. However, problems may occur, including:  Pain or soreness at the injection site.  Infection at the injection site.  Damage to nerves or blood vessels.  What happens before the procedure?  Ask your health care provider about: ? Changing or stopping your regular medicines. This is especially important if you are taking diabetes medicines or blood thinners. ? Taking  medicines such as aspirin and ibuprofen. These medicines can thin your blood. Do not take these medicines before your procedure if your  health care provider instructs you not to.  Follow instructions from your health care provider about eating or drinking restrictions.  Plan to have someone take you home after the procedure.  If you go home right after the procedure, plan to have someone with you for 24 hours. What happens during the procedure?  You will be given one or more of the following: ? A medicine to help you relax (sedative). ? A medicine to numb the area (local anesthetic).  You will be awake during the procedure. You will need to be able to talk with the health care provider during the procedure.  With the help of a type of X-ray (fluoroscopy), the health care provider will insert a radiofrequency needle into the area to be treated.  Next, a wire that carries the radio waves (electrode) will be put through the radiofrequency needle. An electrical pulse will be sent through the electrode to verify the correct nerve. You will feel a tingling sensation, and you may have muscle twitching.  Then, the tissue that is around the needle tip will be heated by an electric current that is passed using the radiofrequency machine. This will numb the nerves.  A bandage (dressing) will be put on the insertion area after the procedure is done. The procedure may vary among health care providers and hospitals. What happens after the procedure?  Your blood pressure, heart rate, breathing rate, and blood oxygen level will be monitored often until the medicines you were given have worn off.  Return to your normal activities as directed by your health care provider. This information is not intended to replace advice given to you by your health care provider. Make sure you discuss any questions you have with your health care provider. Document Released: 03/17/2011 Document Revised: 12/25/2015 Document Reviewed: 08/26/2014 Elsevier Interactive Patient Education  2018 [employer].  Facet Joint Block The facet joints connect the  bones of the spine (vertebrae). They make it possible for you to bend, twist, and make other movements with your spine. They also keep you from bending too far, twisting too far, and making other excessive movements. A facet joint block is a procedure where a numbing medicine (anesthetic) is injected into a facet joint. Often, a type of anti-inflammatory medicine called a steroid is also injected. A facet joint block may be done to diagnose neck or back pain. If the pain gets better after a facet joint block, it means the pain is probably coming from the facet joint. If the pain does not get better, it means the pain is probably not coming from the facet joint. A facet joint block may also be done to relieve neck or back pain caused by an inflamed facet joint. A facet joint block is only done to relieve pain if the pain does not improve with other methods, such as medicine, exercise programs, and physical therapy. Tell a health care provider about:  Any allergies you have.  All medicines you are taking, including vitamins, herbs, eye drops, creams, and over-the-counter medicines.  Any problems you or family members have had with anesthetic medicines.  Any blood disorders you have.  Any surgeries you have had.  Any medical conditions you have.  Whether you are pregnant or may be pregnant. What are the risks? Generally, this is a safe procedure. However, problems may occur, including:  Bleeding.  Injury to a nerve near the injection site.  Pain at the injection site.  Weakness or numbness in areas controlled by nerves near the injection site.  Infection.  Temporary fluid retention.  Allergic reactions to medicines or dyes.  Injury to other structures or organs near the injection site.  What happens before the procedure?  Follow instructions from your health care provider about eating or drinking restrictions.  Ask your health care provider about: ? Changing or stopping your  regular medicines. This is especially important if you are taking diabetes medicines or blood thinners. ? Taking medicines such as aspirin and ibuprofen. These medicines can thin your blood. Do not take these medicines before your procedure if your health care provider instructs you not to.  Do not take any new dietary supplements or medicines without asking your health care provider first.  Plan to have someone take you home after the procedure. What happens during the procedure?  You may need to remove your clothing and dress in an open-back gown.  The procedure will be done while you are lying on an X-ray table. You will most likely be asked to lie on your stomach, but you may be asked to lie in a different position if an injection will be made in your neck.  Machines will be used to monitor your oxygen levels, heart rate, and blood pressure.  If an injection will be made in your neck, an IV tube will be inserted into one of your veins. Fluids and medicine will flow directly into your body through the IV tube.  The area over the facet joint where the injection will be made will be cleaned with soap. The surrounding skin will be covered with clean drapes.  A numbing medicine (local anesthetic) will be applied to your skin. Your skin may sting or burn for a moment.  A video X-ray machine (fluoroscopy) will be used to locate the joint. In some cases, a CT scan may be used.  A contrast dye may be injected into the facet joint area to help locate the joint.  When the joint is located, an anesthetic will be injected into the joint through the needle.  Your health care provider will ask you whether you feel pain relief. If you do feel relief, a steroid may be injected to provide pain relief for a longer period of time. If you do not feel relief or feel only partial relief, additional injections of an anesthetic may be made in other facet joints.  The needle will be removed.  Your skin will  be cleaned.  A bandage (dressing) will be applied over each injection site. The procedure may vary among health care providers and hospitals. What happens after the procedure?  You will be observed for 15-30 minutes before being allowed to go home. This information is not intended to replace advice given to you by your health care provider. Make sure you discuss any questions you have with your health care provider. Document Released: 12/08/2006 Document Revised: 08/20/2015 Document Reviewed: 04/14/2015 Elsevier Interactive Patient Education  [employer].

## 2017-11-21 NOTE — Progress Notes (Signed)
Patient's Name: Hector [name]  MRN: [id number]  Referring Provider: Einar Pheasant, MD  DOB: Aug 29, 1934  PCP: Einar Pheasant, MD  DOS: 11/21/2017  Note by: Gaspar Cola, MD  Service setting: Ambulatory outpatient  Specialty: Interventional Pain Management  Location: [facility] (AMB) Pain Management Facility    Patient type: Established   Primary Reason(s) for Visit: Encounter for post-procedure evaluation of chronic illness with mild to moderate exacerbation CC: Back Pain (lower)  HPI  Hector [name] is a 83 y.o. year old, male patient, who comes today for a post-procedure evaluation. He has Hypertension; Hypercholesterolemia; Sleep apnea; History of prostate cancer; Malignant melanoma ([place]); History of colonic polyps; Barrett's esophagus; BMI 37.0-37.9, adult; Pharmacologic therapy; Shoulder pain, left; Chronic hip pain (Left); Chronic low back pain (Primary Area of Pain) (Bilateral) (L>R); Chronic pain syndrome; Other long term (current) drug therapy; Other reduced mobility; Other specified health status; Polyarthritis, unspecified (Secondary Area of Pain); L5-S1 bilateral pars defect with spondylolisthesis; Lumbosacral Grade 1 Anterolisthesis of L5 over S1; Lumbar facet arthropathy (Bilateral); Lumbar foraminal stenosis (L5-S1) (Bilateral) (L>R); Chronic Lumbar radiculitis (L5) (Left); Disorder of skeletal system; Problems influencing health status; Opiate use; Chronic musculoskeletal pain; Vitamin D deficiency; Lumbar facet syndrome (Bilateral) (L>R); Chronic lower extremity pain (Secondary Area of Pain) (Left); Chronic lower extremity radicular pain (L5) (Left); Lumbar facet osteoarthritis (Bilateral); Osteoarthritis of lumbar spine; Lumbar spondylosis; and Spondylosis without myelopathy or radiculopathy, lumbosacral region on their problem list. His primarily concern today is the Back Pain (lower)  Pain Assessment: Location: Lower Back Radiating: denies Onset: More than a month ago Duration: Chronic  pain Quality: Throbbing Severity: 0-No pain/10 (self-reported pain score)  Note: Reported level is compatible with observation.                               Timing: Intermittent Modifying factors: rest  Hector [name] comes in today for post-procedure evaluation after the treatment done on 10/27/2017.  As usual, the patient has attained excellent benefit from the diagnostic bilateral lumbar facet block.  If the pain continues to return, I think he would be an excellent candidate for radiofrequency ablation.  Further details on both, my assessment(s), as well as the proposed treatment plan, please see below.  Post-Procedure Assessment  10/27/2017 Procedure: Diagnostic bilateral lumbar facet block #2 under fluoroscopic guidance and IV sedation Pre-procedure pain score:  3/10 Post-procedure pain score: 0/10 (100% relief) Influential Factors: BMI: 37.25 kg/m Intra-procedural challenges: None observed.         Assessment challenges: None detected.              Reported side-effects: None.        Post-procedural adverse reactions or complications: None reported         Sedation: Sedation provided. When no sedatives are used, the analgesic levels obtained are directly associated to the effectiveness of the local anesthetics. However, when sedation is provided, the level of analgesia obtained during the initial 1 hour following the intervention, is believed to be the result of a combination of factors. These factors may include, but are not limited to: 1. The effectiveness of the local anesthetics used. 2. The effects of the analgesic(s) and/or anxiolytic(s) used. 3. The degree of discomfort experienced by the patient at the time of the procedure. 4. The patients ability and reliability in recalling and recording the events. 5. The presence and influence of possible secondary gains and/or psychosocial factors. Reported result: Relief experienced  during the 1st hour after the procedure: 100 %  (Ultra-Short Term Relief)            Interpretative annotation: Clinically appropriate result. Analgesia during this period is likely to be Local Anesthetic and/or IV Sedative (Analgesic/Anxiolytic) related.          Effects of local anesthetic: The analgesic effects attained during this period are directly associated to the localized infiltration of local anesthetics and therefore cary significant diagnostic value as to the etiological location, or anatomical origin, of the pain. Expected duration of relief is directly dependent on the pharmacodynamics of the local anesthetic used. Long-acting (4-6 hours) anesthetics used.  Reported result: Relief during the next 4 to 6 hour after the procedure: 100 % (Short-Term Relief)            Interpretative annotation: Clinically appropriate result. Analgesia during this period is likely to be Local Anesthetic-related.          Long-term benefit: Defined as the period of time past the expected duration of local anesthetics (1 hour for short-acting and 4-6 hours for long-acting). With the possible exception of prolonged sympathetic blockade from the local anesthetics, benefits during this period are typically attributed to, or associated with, other factors such as analgesic sensory neuropraxia, antiinflammatory effects, or beneficial biochemical changes provided by agents other than the local anesthetics.  Reported result: Extended relief following procedure: 95 % (Long-Term Relief)            Interpretative annotation: Clinically appropriate result. Good relief. Therapeutic success. Inflammation plays a part in the etiology to the pain.          Current benefits: Defined as reported results that persistent at this point in time.   Analgesia: 75-100 % Hector [name] reports that both, extremity and the axial pain improved with the treatment. Function: Hector [name] reports improvement in function ROM: Hector [name] reports improvement in ROM Interpretative annotation: Ongoing  benefit. Therapeutic benefit observed. Effective therapeutic approach.          Interpretation: Results would suggest a successful palliative intervention.         Mr. Shuck indicates having had an unsuccessful trial of physical therapy, which he described as non-beneficial and painful.  Plan:  Set up procedure as a PRN palliative treatment option for this patient.       "The patient has failed to respond to conservative therapies including over-the-counter medications, anti-inflammatories, muscle relaxants, membrane stabilizers, opioids, physical therapy modalities such as heat and ice, as well as more invasive techniques such as nerve blocks. Because Mr. Milley did attain more than 50% relief of the pain during a series of diagnostic blocks conducted in separate occasions, I believe it is medically necessary to proceed with Radiofrequency Ablation, in order to attempt gaining longer relief.  Laboratory Chemistry  Inflammation Markers (CRP: Acute Phase) (ESR: Chronic Phase) Lab Results  Component Value Date   CRP 2.8 06/13/2017   ESRSEDRATE 13 06/13/2017                         Renal Function Markers Lab Results  Component Value Date   BUN 18 11/14/2017   CREATININE 1.31 11/14/2017   GFRAA 56 (L) 06/13/2017   GFRNONAA 49 (L) 06/13/2017                              Hepatic Function Markers Lab Results  Component Value Date  AST 24 11/14/2017   ALT 32 11/14/2017   ALBUMIN 4.0 11/14/2017   ALKPHOS 47 11/14/2017                        Electrolytes Lab Results  Component Value Date   NA 141 11/14/2017   K 4.0 11/14/2017   CL 102 11/14/2017   CALCIUM 9.6 11/14/2017   MG 2.0 06/13/2017                        Neuropathy Markers Lab Results  Component Value Date   VITAMINB12 238 06/13/2017   HGBA1C 5.4 11/14/2017                        Bone Pathology Markers Lab Results  Component Value Date   25OHVITD1 20 (L) 06/13/2017   25OHVITD2 <1.0 06/13/2017   25OHVITD3 20  06/13/2017                         Coagulation Parameters Lab Results  Component Value Date   PLT 226.0 11/14/2017                        Cardiovascular Markers Lab Results  Component Value Date   HGB 15.3 11/14/2017   HCT 45.3 11/14/2017                         Note: Lab results reviewed.  Recent Diagnostic Imaging Results  DG C-Arm 1-60 Min-No Report Fluoroscopy was utilized by the requesting physician.  No radiographic  interpretation.   Complexity Note: I personally reviewed the fluoroscopic imaging of the procedure.                        Meds   Current Outpatient Medications:  .  acetaminophen (TYLENOL) 500 MG tablet, Take 500 mg by mouth every 6 (six) hours as needed., Disp: , Rfl:  .  amLODipine (NORVASC) 5 MG tablet, Take 1 tablet (5 mg total) by mouth daily., Disp: 30 tablet, Rfl: 5 .  aspirin 81 MG tablet, Take 81 mg by mouth daily., Disp: , Rfl:  .  fluticasone (FLONASE) 50 MCG/ACT nasal spray, TWO PUFFS IN EACH NOSTRIL ONCE A DAY, Disp: 16 g, Rfl: 4 .  hydrochlorothiazide (HYDRODIURIL) 25 MG tablet, Take 1 tablet (25 mg total) by mouth daily., Disp: 30 tablet, Rfl: 5 .  hyoscyamine (LEVSIN, ANASPAZ) 0.125 MG tablet, Take 0.125 mg by mouth as needed., Disp: , Rfl:  .  irbesartan (AVAPRO) 300 MG tablet, Take 1 tablet (300 mg total) by mouth daily., Disp: 30 tablet, Rfl: 5 .  L-Lysine 500 MG TABS, Take by mouth daily., Disp: , Rfl:  .  levothyroxine (SYNTHROID, LEVOTHROID) 88 MCG tablet, TAKE 1 TABLET BY MOUTH DAILY, Disp: 30 tablet, Rfl: 3 .  omeprazole (PRILOSEC) 20 MG capsule, TAKE ONE CAPSULE BY MOUTH TWICE A DAY, Disp: 60 capsule, Rfl: 3 .  rosuvastatin (CRESTOR) 10 MG tablet, Take 1 tablet (10 mg total) by mouth daily., Disp: 30 tablet, Rfl: 2 .  valACYclovir (VALTREX) 1000 MG tablet, TAKE 1 TABLET BY MOUTH EVERY DAY, Disp: 30 tablet, Rfl: 3 .  Calcium Carb-Cholecalciferol (CALCIUM PLUS D3 ABSORBABLE) [phone] MG-UNIT CAPS, Take 1 capsule by mouth daily with  breakfast., Disp: 90 capsule, Rfl: 0 .  cyclobenzaprine (FLEXERIL) 5 MG  tablet, Take 1 tablet (5 mg total) by mouth at bedtime., Disp: 30 tablet, Rfl: 5 .  traMADol (ULTRAM) 50 MG tablet, Take 1 tablet (50 mg total) by mouth every 8 (eight) hours as needed for severe pain., Disp: 90 tablet, Rfl: 5  ROS  Constitutional: Denies any fever or chills Gastrointestinal: No reported hemesis, hematochezia, vomiting, or acute GI distress Musculoskeletal: Denies any acute onset joint swelling, redness, loss of ROM, or weakness Neurological: No reported episodes of acute onset apraxia, aphasia, dysarthria, agnosia, amnesia, paralysis, loss of coordination, or loss of consciousness  Allergies  Mr. Labat is allergic to tape.  [place]  Drug: Mr. Matlack  reports that he does not use drugs. Alcohol:  reports that he does not drink alcohol. Tobacco:  reports that he has never smoked. He has never used smokeless tobacco. Medical:  has a past medical history of Anemia, Arthritis, Cancer ([place]), Chicken pox, Diverticulitis, Dysrhythmia, GERD (gastroesophageal reflux disease), Hyperlipidemia, Hypertension, Hypothyroidism, Melanoma ([place]), Sleep apnea, and Ulcer. Surgical: Mr. Kepner  has a past surgical history that includes Prostate surgery; cystocopy (2003); Hemorrhoid surgery; Cardiac catheterization; sleep study; Colon surgery (2006-2008-2011); Cataract Surgery (Right, 02/13/14); and Esophagogastroduodenoscopy (egd) with propofol (N/A, 08/04/2016). Family: family history includes Heart disease in his father; Kidney disease in his father; Liver disease in his mother.  Constitutional Exam  General appearance: Well nourished, well developed, and well hydrated. In no apparent acute distress Vitals:   11/21/17 1312  BP: (!) 151/67  Pulse: 63  Resp: 18  Temp: 97.7 F (36.5 C)  TempSrc: Oral  SpO2: 97%  Weight: 245 lb (111.1 kg)  Height: '5\' 8"'  (1.727 m)   BMI Assessment: Estimated body mass index is 37.25 kg/m as  calculated from the following:   Height as of this encounter: '5\' 8"'  (1.727 m).   Weight as of this encounter: 245 lb (111.1 kg).  BMI interpretation table: BMI level Category Range association with higher incidence of chronic pain  <18 kg/m2 Underweight   18.5-24.9 kg/m2 Ideal body weight   25-29.9 kg/m2 Overweight Increased incidence by 20%  30-34.9 kg/m2 Obese (Class I) Increased incidence by 68%  35-39.9 kg/m2 Severe obesity (Class II) Increased incidence by 136%  >40 kg/m2 Extreme obesity (Class III) Increased incidence by 254%   BMI Readings from Last 4 Encounters:  11/21/17 37.25 kg/m  11/16/17 37.25 kg/m  10/27/17 37.25 kg/m  08/10/17 36.49 kg/m   Wt Readings from Last 4 Encounters:  11/21/17 245 lb (111.1 kg)  11/16/17 245 lb (111.1 kg)  10/27/17 245 lb (111.1 kg)  08/10/17 240 lb (108.9 kg)  Psych/Mental status: Alert, oriented x 3 (person, place, & time)       Eyes: PERLA Respiratory: No evidence of acute respiratory distress  Cervical Spine Area Exam  Skin & Axial Inspection: No masses, redness, edema, swelling, or associated skin lesions Alignment: Symmetrical Functional ROM: Unrestricted ROM      Stability: No instability detected Muscle Tone/Strength: Functionally intact. No obvious neuro-muscular anomalies detected. Sensory (Neurological): Unimpaired Palpation: No palpable anomalies              Upper Extremity (UE) Exam    Side: Right upper extremity  Side: Left upper extremity  Skin & Extremity Inspection: Skin color, temperature, and hair growth are WNL. No peripheral edema or cyanosis. No masses, redness, swelling, asymmetry, or associated skin lesions. No contractures.  Skin & Extremity Inspection: Skin color, temperature, and hair growth are WNL. No peripheral edema or cyanosis. No masses, redness,  swelling, asymmetry, or associated skin lesions. No contractures.  Functional ROM: Unrestricted ROM          Functional ROM: Unrestricted ROM           Muscle Tone/Strength: Functionally intact. No obvious neuro-muscular anomalies detected.  Muscle Tone/Strength: Functionally intact. No obvious neuro-muscular anomalies detected.  Sensory (Neurological): Unimpaired          Sensory (Neurological): Unimpaired          Palpation: No palpable anomalies              Palpation: No palpable anomalies              Specialized Test(s): Deferred         Specialized Test(s): Deferred          Thoracic Spine Area Exam  Skin & Axial Inspection: No masses, redness, or swelling Alignment: Symmetrical Functional ROM: Unrestricted ROM Stability: No instability detected Muscle Tone/Strength: Functionally intact. No obvious neuro-muscular anomalies detected. Sensory (Neurological): Unimpaired Muscle strength & Tone: No palpable anomalies  Lumbar Spine Area Exam  Skin & Axial Inspection: No masses, redness, or swelling Alignment: Symmetrical Functional ROM: Improved after treatment       Stability: No instability detected Muscle Tone/Strength: Functionally intact. No obvious neuro-muscular anomalies detected. Sensory (Neurological): Unimpaired Palpation: No palpable anomalies       Provocative Tests: Lumbar Hyperextension and rotation test: Improved after treatment       Lumbar Lateral bending test: evaluation deferred today       Patrick's Maneuver: evaluation deferred today                    Gait & Posture Assessment  Ambulation: Unassisted Gait: Relatively normal for age and body habitus Posture: WNL   Lower Extremity Exam    Side: Right lower extremity  Side: Left lower extremity  Skin & Extremity Inspection: Skin color, temperature, and hair growth are WNL. No peripheral edema or cyanosis. No masses, redness, swelling, asymmetry, or associated skin lesions. No contractures.  Skin & Extremity Inspection: Skin color, temperature, and hair growth are WNL. No peripheral edema or cyanosis. No masses, redness, swelling, asymmetry, or associated skin  lesions. No contractures.  Functional ROM: Unrestricted ROM          Functional ROM: Unrestricted ROM          Muscle Tone/Strength: Functionally intact. No obvious neuro-muscular anomalies detected.  Muscle Tone/Strength: Functionally intact. No obvious neuro-muscular anomalies detected.  Sensory (Neurological): Unimpaired  Sensory (Neurological): Unimpaired  Palpation: No palpable anomalies  Palpation: No palpable anomalies   Assessment  Primary Diagnosis & Pertinent Problem List: The primary encounter diagnosis was Chronic low back pain (Primary Area of Pain) (Bilateral) (L>R). Diagnoses of Spondylosis without myelopathy or radiculopathy, lumbosacral region, Lumbar facet syndrome (Bilateral) (L>R), Lumbar facet osteoarthritis (Bilateral), Chronic pain syndrome, and Chronic musculoskeletal pain were also pertinent to this visit.  Status Diagnosis  Controlled Controlled Controlled 1. Chronic low back pain (Primary Area of Pain) (Bilateral) (L>R)   2. Spondylosis without myelopathy or radiculopathy, lumbosacral region   3. Lumbar facet syndrome (Bilateral) (L>R)   4. Lumbar facet osteoarthritis (Bilateral)   5. Chronic pain syndrome   6. Chronic musculoskeletal pain     Problems updated and reviewed during this visit: No problems updated. Plan of Care  Pharmacotherapy (Medications Ordered): Meds ordered this encounter  Medications  . traMADol (ULTRAM) 50 MG tablet    Sig: Take 1 tablet (50 mg total) by  mouth every 8 (eight) hours as needed for severe pain.    Dispense:  90 tablet    Refill:  5    Do not place this medication, or any other prescription from our practice, on "Automatic Refill". Patient may have prescription filled one day early if pharmacy is closed on scheduled refill date. Do not fill until: 11/21/17 To last until: 05/20/18  . cyclobenzaprine (FLEXERIL) 5 MG tablet    Sig: Take 1 tablet (5 mg total) by mouth at bedtime.    Dispense:  30 tablet    Refill:  5    Do  not place this medication, or any other prescription from our practice, on "Automatic Refill". Patient may have prescription filled one day early if pharmacy is closed on scheduled refill date.   Medications administered today: Lamarius A. Mulvihill had no medications administered during this visit.   Procedure Orders     LUMBAR FACET(MEDIAL BRANCH NERVE BLOCK) MBNB     Radiofrequency,Lumbar Lab Orders  No laboratory test(s) ordered today   Imaging Orders  No imaging studies ordered today   Referral Orders  No referral(s) requested today    Interventional management options: Planned, scheduled, and/or pending:   Therapeutic left-sided lumbar facet RFA under fluoroscopic guidance and IV sedation (PRN)   Considering:   Diagnostic bilateral lumbar facet block #3  Possible bilateral lumbar facet RFA  Diagnostic left L5-S1 transforaminal epidural steroid injection  Possible left L5 nerve root ganglion RFA Diagnosticleft sided L4-5 interlaminar lumbar epidural steroid injection   Palliative PRN treatment(s):   Palliativebilateral lumbar facet block   Provider-requested follow-up: Return for PRN-RFA (fluoro + sedation): (L) L-FCT #1.  Future Appointments  Date Time Provider [place]  12/29/2017  9:00 AM [facility]  03/28/2018  9:00 AM [facility]  03/30/2018  9:30 AM Einar Pheasant, MD [facility]  05/15/2018  9:00 AM Vevelyn Francois, NP [facility] None  05/25/2018  9:00 AM O'Brien-Blaney, Bryson Corona, LPN [facility]   Primary Care Physician: Einar Pheasant, MD Location: [facility] Note by: Gaspar Cola, MD Date: 11/21/2017; Time: 1:57 PM

## 2017-11-21 NOTE — Progress Notes (Signed)
Nursing Pain Medication Assessment:  Safety precautions to be maintained throughout the outpatient stay will include: orient to surroundings, keep bed in low position, maintain call bell within reach at all times, provide assistance with transfer out of bed and ambulation.  Medication Inspection Compliance: Pill count conducted under aseptic conditions, in front of the patient. Neither the pills nor the bottle was removed from the patient's sight at any time. Once count was completed pills were immediately returned to the patient in their original bottle.  Medication: Tramadol (Ultram) Pill/Patch Count: 2 of 90 pills remain Pill/Patch Appearance: Markings consistent with prescribed medication Bottle Appearance: Standard pharmacy container. Clearly labeled. Filled Date: 03/23 / 2019 Last Medication intake:  Today

## 2017-11-29 DIAGNOSIS — Z86010 Personal history of colonic polyps: Secondary | ICD-10-CM | POA: Diagnosis not present

## 2017-12-29 ENCOUNTER — Encounter: Payer: Self-pay | Admitting: Internal Medicine

## 2017-12-29 ENCOUNTER — Other Ambulatory Visit (INDEPENDENT_AMBULATORY_CARE_PROVIDER_SITE_OTHER): Payer: Medicare Other

## 2017-12-29 DIAGNOSIS — E7800 Pure hypercholesterolemia, unspecified: Secondary | ICD-10-CM | POA: Diagnosis not present

## 2017-12-29 LAB — HEPATIC FUNCTION PANEL
ALBUMIN: 4 g/dL (ref 3.5–5.2)
ALT: 25 U/L (ref 0–53)
AST: 20 U/L (ref 0–37)
Alkaline Phosphatase: 49 U/L (ref 39–117)
Bilirubin, Direct: 0.1 mg/dL (ref 0.0–0.3)
TOTAL PROTEIN: 7.2 g/dL (ref 6.0–8.3)
Total Bilirubin: 0.6 mg/dL (ref 0.2–1.2)

## 2017-12-29 NOTE — Addendum Note (Signed)
Addended by: Arby Barrette on: 12/29/2017 08:42 AM   Modules accepted: Orders

## 2018-01-18 ENCOUNTER — Encounter: Payer: Self-pay | Admitting: Internal Medicine

## 2018-01-19 NOTE — Telephone Encounter (Signed)
Please let them know that I am not in the office this week.  He (himself) does not have a know history of gout.  Given red, swollen, etc - needs to be evaluated.

## 2018-01-19 NOTE — Telephone Encounter (Signed)
Left message to return call 

## 2018-01-23 ENCOUNTER — Ambulatory Visit
Admission: EM | Admit: 2018-01-23 | Discharge: 2018-01-23 | Disposition: A | Discharge status: Home / Self Care | Payer: Medicare Other | Attending: Emergency Medicine | Admitting: Emergency Medicine

## 2018-01-23 ENCOUNTER — Encounter: Payer: Self-pay | Admitting: *Deleted

## 2018-01-23 DIAGNOSIS — M79675 Pain in left toe(s): Secondary | ICD-10-CM

## 2018-01-23 DIAGNOSIS — M79674 Pain in right toe(s): Secondary | ICD-10-CM | POA: Diagnosis not present

## 2018-01-23 DIAGNOSIS — M79671 Pain in right foot: Secondary | ICD-10-CM | POA: Diagnosis not present

## 2018-01-23 DIAGNOSIS — M79672 Pain in left foot: Secondary | ICD-10-CM | POA: Diagnosis not present

## 2018-01-23 LAB — BASIC METABOLIC PANEL
Anion gap: 14 (ref 5–15)
BUN: 18 mg/dL (ref 6–20)
CALCIUM: 9.6 mg/dL (ref 8.9–10.3)
CHLORIDE: 98 mmol/L — AB (ref 101–111)
CO2: 26 mmol/L (ref 22–32)
Creatinine, Ser: 1.19 mg/dL (ref 0.61–1.24)
GFR calc Af Amer: >60 mL/min (ref >60)
GFR calc non Af Amer: 56 mL/min — ABNORMAL LOW (ref >60)
GLUCOSE: 106 mg/dL — AB (ref 65–99)
Potassium: 3.9 mmol/L (ref 3.5–5.1)
Sodium: 138 mmol/L (ref 135–145)

## 2018-01-23 LAB — URIC ACID: URIC ACID, SERUM: 7.5 mg/dL (ref 4.4–7.6)

## 2018-01-23 MED ORDER — PREDNISONE 10 MG PO TABS: ORAL_TABLET | ORAL | 0 refills | Status: DC

## 2018-01-23 MED ORDER — KETOROLAC TROMETHAMINE 60 MG/2ML IM SOLN
30.0000 mg | Freq: Once | INTRAMUSCULAR | Status: AC
  Administered 2018-01-23: 30 mg via INTRAMUSCULAR

## 2018-01-23 NOTE — Discharge Instructions (Signed)
You were given a shot of Toradol today to help with pain and inflammation. Recommend start oral Prednisone 10mg  tablets- take 4 tablets today and tomorrow and then decrease by 1 tablet every 2 days until finished on day 8. Recommend increase fluid intake. Eat a low protein (purine) diet (information provided). If pain, swelling does not improve within 48 hours or gets worse, go to the ER for further evaluation. Otherwise, follow-up with Dr. Elvina Mattes, podiatrist, as planned.

## 2018-01-23 NOTE — ED Provider Notes (Signed)
[facility]    CSN: [id number] Arrival date & time: 01/23/18  0859     History   Chief Complaint Chief Complaint  Patient presents with  . Foot Pain    HPI Hector [name] is a 83 y.o. male.   83 year old male presents with bilateral foot and toe pain. Started with left foot distal  swelling, redness and pain last week. Now mainly the left great toe is very painful and swollen. 2 days ago the right foot and great toe started swelling and has become painful. Having difficulty bearing weight on both feet due to pain. No history of gout but father had gout. Denies any fever, chest pain, lower leg pain or swelling. Great toe nails on both feet have been raised and discolored but no previous treatment. He has never seen a Podiatrist before. Other chronic health issues include HTN, thyroid disease, hyperlipidemia, GERD, chronic back pain, arthritis- mainly knees, sleep apnea and seasonal allergies. Currently on Norvasc, HCTZ, Avapro, Synthroid, Crestor, Prilosec, Flonase and Ultram daily. History of gastric ulcer many years ago so does not take NSAIDs. Has tried Tylenol, and Tart Cherry Extract with minimal relief in current symptoms. No history of diabetes. Does not smoke.   The history is provided by the patient and the spouse.    Past Medical History:  Diagnosis Date  . Anemia   . Arthritis   . Cancer ([place])    prostate,skin  . Chicken pox   . Diverticulitis   . Dysrhythmia   . GERD (gastroesophageal reflux disease)   . Hyperlipidemia   . Hypertension   . Hypothyroidism   . Melanoma ([place])    Malignant resection  . Sleep apnea   . Ulcer     Patient Active Problem List   Diagnosis Date Noted  . Spondylosis without myelopathy or radiculopathy, lumbosacral region 10/27/2017  . Lumbar facet syndrome (Bilateral) (L>R) 07/14/2017  . Chronic lower extremity pain (Secondary Area of Pain) (Left) 07/14/2017  . Chronic lower extremity radicular pain (L5) (Left) 07/14/2017  .  Lumbar facet osteoarthritis (Bilateral) 07/14/2017  . Osteoarthritis of lumbar spine 07/14/2017  . Lumbar spondylosis 07/14/2017  . Chronic musculoskeletal pain 06/29/2017  . Vitamin D deficiency 06/29/2017  . L5-S1 bilateral pars defect with spondylolisthesis 06/28/2017  . Lumbosacral Grade 1 Anterolisthesis of L5 over S1 06/28/2017  . Lumbar facet arthropathy (Bilateral) 06/28/2017  . Lumbar foraminal stenosis (L5-S1) (Bilateral) (L>R) 06/28/2017  . Chronic Lumbar radiculitis (L5) (Left) 06/28/2017  . Disorder of skeletal system 06/28/2017  . Problems influencing health status 06/28/2017  . Opiate use 06/28/2017  . Chronic low back pain (Primary Area of Pain) (Bilateral) (L>R) 06/13/2017  . Chronic pain syndrome 06/13/2017  . Other long term (current) drug therapy 06/13/2017  . Other reduced mobility 06/13/2017  . Other specified health status 06/13/2017  . Polyarthritis, unspecified (Secondary Area of Pain) 06/13/2017  . Shoulder pain, left 08/29/2016  . Chronic hip pain (Left) 08/29/2016  . Pharmacologic therapy 02/22/2015  . BMI 37.0-37.9, adult 02/23/2014  . Barrett's esophagus 12/07/2012  . History of colonic polyps 10/01/2012  . Sleep apnea 08/13/2012  . History of prostate cancer 08/13/2012  . Malignant melanoma ([place]) 08/13/2012  . Hypertension 08/10/2012  . Hypercholesterolemia 08/10/2012    Past Surgical History:  Procedure Laterality Date  . CARDIAC CATHETERIZATION    . Cataract Surgery Right 02/13/14  . COLON SURGERY  2006-2008-2011   polyps removed  . cystocopy  2003  . ESOPHAGOGASTRODUODENOSCOPY (EGD) WITH  PROPOFOL N/A 08/04/2016   Procedure: ESOPHAGOGASTRODUODENOSCOPY (EGD) WITH PROPOFOL;  Surgeon: Manya Silvas, MD;  Location: [facility] ENDOSCOPY;  Service: Endoscopy;  Laterality: N/A;  . HEMORRHOID SURGERY    . PROSTATE SURGERY    . sleep study         Home Medications    Prior to Admission medications   Medication Sig Start Date End Date Taking?  Authorizing Provider  acetaminophen (TYLENOL) 500 MG tablet Take 500 mg by mouth every 6 (six) hours as needed.   Yes [provider]  amLODipine (NORVASC) 5 MG tablet Take 1 tablet (5 mg total) by mouth daily. 11/16/17  Yes Einar Pheasant, MD  aspirin 81 MG tablet Take 81 mg by mouth daily.   Yes [provider]  fluticasone (FLONASE) 50 MCG/ACT nasal spray TWO PUFFS IN EACH NOSTRIL ONCE A DAY 06/04/15  Yes Einar Pheasant, MD  hydrochlorothiazide (HYDRODIURIL) 25 MG tablet Take 1 tablet (25 mg total) by mouth daily. 11/16/17  Yes Einar Pheasant, MD  irbesartan (AVAPRO) 300 MG tablet Take 1 tablet (300 mg total) by mouth daily. 11/16/17  Yes Einar Pheasant, MD  L-Lysine 500 MG TABS Take by mouth daily.   Yes [provider]  levothyroxine (SYNTHROID, LEVOTHROID) 88 MCG tablet TAKE 1 TABLET BY MOUTH DAILY 10/25/17  Yes Einar Pheasant, MD  omeprazole (PRILOSEC) 20 MG capsule TAKE ONE CAPSULE BY MOUTH TWICE A DAY 10/25/17  Yes Einar Pheasant, MD  rosuvastatin (CRESTOR) 10 MG tablet Take 1 tablet (10 mg total) by mouth daily. 11/16/17  Yes Einar Pheasant, MD  traMADol (ULTRAM) 50 MG tablet Take 1 tablet (50 mg total) by mouth every 8 (eight) hours as needed for severe pain. 11/21/17 05/20/18 Yes Milinda Pointer, MD  valACYclovir (VALTREX) 1000 MG tablet TAKE 1 TABLET BY MOUTH EVERY DAY 10/25/17  Yes Einar Pheasant, MD  Calcium Carb-Cholecalciferol (CALCIUM PLUS D3 ABSORBABLE) [phone] MG-UNIT CAPS Take 1 capsule by mouth daily with breakfast. 06/29/17 10/27/17  Milinda Pointer, MD  hyoscyamine (LEVSIN, ANASPAZ) 0.125 MG tablet Take 0.125 mg by mouth as needed.    [provider]  predniSONE (DELTASONE) 10 MG tablet Take 4 tablets by mouth on day 1 & 2, then 3 tablets on day 3&4, 2 tablets on day 5&6, 1 tablet on day 7&8. 01/23/18   Geovanie Winnett, Nicholes Stairs, NP    Family History Family History  Problem Relation Age of Onset  . Liver disease Mother   . Heart disease  Father   . Kidney disease Father     Social History Social History   Tobacco Use  . Smoking status: Never Smoker  . Smokeless tobacco: Never Used  Substance Use Topics  . Alcohol use: No    Alcohol/week: 0.0 oz  . Drug use: No     Allergies   Tape   Review of Systems Review of Systems  Constitutional: Negative for activity change, appetite change, chills, fatigue and fever.  Respiratory: Negative for cough, chest tightness, shortness of breath and wheezing.   Cardiovascular: Negative for chest pain, palpitations and leg swelling.  Gastrointestinal: Negative for diarrhea, nausea and vomiting.  Genitourinary: Positive for decreased urine volume. Negative for dysuria, flank pain and hematuria.  Musculoskeletal: Positive for arthralgias, back pain, gait problem and joint swelling. Negative for myalgias.  Skin: Positive for color change. Negative for rash and wound.  Allergic/Immunologic: Positive for environmental allergies.  Neurological: Negative for dizziness, tremors, seizures, syncope, weakness, light-headedness, numbness and headaches.  Hematological: Negative for adenopathy. Does  not bruise/bleed easily.     Physical Exam Triage Vital Signs ED Triage Vitals  Enc Vitals Group     BP 01/23/18 0924 138/83     Pulse Rate 01/23/18 0924 (!) 56     Resp 01/23/18 0924 16     Temp 01/23/18 0924 98.7 F (37.1 C)     Temp Source 01/23/18 0924 Oral     SpO2 01/23/18 0924 98 %     Weight 01/23/18 0929 245 lb (111.1 kg)     Height 01/23/18 0929 5\' 8"  (1.727 m)     Head Circumference --      Peak Flow --      Pain Score 01/23/18 0928 6     Pain Loc --      Pain Edu? --      Excl. in [place]? --    No data found.  Updated Vital Signs BP 138/83 (BP Location: Left Arm)   Pulse (!) 56   Temp 98.7 F (37.1 C) (Oral)   Resp 16   Ht 5\' 8"  (1.727 m)   Wt 245 lb (111.1 kg)   SpO2 98%   BMI 37.25 kg/m   Visual Acuity Right Eye Distance:   Left Eye Distance:   Bilateral  Distance:    Right Eye Near:   Left Eye Near:    Bilateral Near:     Physical Exam  Constitutional: He is oriented to person, place, and time. He appears well-developed and well-nourished. He is cooperative. He does not appear ill. No distress.  Patient sitting in wheel chair in no acute distress.   HENT:  Head: Normocephalic and atraumatic.  Eyes: Conjunctivae and EOM are normal.  Neck: Normal range of motion.  Cardiovascular: Regular rhythm. Bradycardia present.  Pulmonary/Chest: Effort normal.  Musculoskeletal: He exhibits edema and tenderness.       Right foot: There is decreased range of motion, tenderness, swelling and decreased capillary refill. There is no crepitus, no deformity and no laceration.       Left foot: There is decreased range of motion, tenderness, swelling and decreased capillary refill. There is no crepitus, no deformity and no laceration.       Feet:  Decreased range of motion of toes but full range of motion of ankle. Red, swollen great toe bilaterally and very tender. Swelling and redness present at base of toes on entire left foot and base of 4th and 5th toes of right foot. 1+ non-pitting edema present on distal 1/2 of foot bilaterally. No numbness or decreased sensation. Right great toe nail yellow and raised. Left great toe raised and only slightly discolored. Good pulses but slightly delayed capillary refill bilaterally (3 sec).   Neurological: He is alert and oriented to person, place, and time. He has normal strength. No sensory deficit.  Skin: Skin is warm, dry and intact. Capillary refill takes 2 to 3 seconds. No abrasion, no bruising, no ecchymosis, no petechiae and no rash noted. There is erythema. No cyanosis. Nails show no clubbing.  Psychiatric: He has a normal mood and affect. His behavior is normal. Judgment and thought content normal.  Vitals reviewed.    UC Treatments / Results  Labs (all labs ordered are listed, but only abnormal results are  displayed) Labs Reviewed  BASIC METABOLIC PANEL - Abnormal; Notable for the following components:      Result Value   Chloride 98 (*)    Glucose, Bld 106 (*)    GFR calc non Af Wyvonnia Lora  56 (*)    All other components within normal limits  URIC ACID    EKG None  Radiology No results found.  Procedures Procedures (including critical care time)  Medications Ordered in UC Medications  ketorolac (TORADOL) injection 30 mg (30 mg Intramuscular Given 01/23/18 1205)    Initial Impression / Assessment and Plan / UC Course  I have reviewed the triage vital signs and the nursing notes.  Pertinent labs & imaging results that were available during my care of the patient were reviewed by me and considered in my medical decision making (see chart for details).    Reviewed lab results with patient and wife. Discussed glucose slightly elevated but patient recently ate breakfast. BUN and Creatine normal but GFR slightly decreased- similar to previous blood work in April 2019. Reviewed uric acid level is on high end of normal. Discussed that he may still have gout with a normal uric acid level. Gave Toradol 30mg  IM now to help with pain and inflammation. Reviewed possible treatment options for gout- will trial Prednisone 40mg  and taper every 2 days until finished. Reviewed low purine diet. Continue to monitor symptoms. If pain, swelling does not improve within 48 hours or gets worse, go to the ER for further evaluation. Otherwise, follow-up with Dr. Elvina Mattes, podiatrist, as planned.         Final Clinical Impressions(s) / UC Diagnoses   Final diagnoses:  Toe pain, bilateral  Bilateral foot pain     Discharge Instructions     You were given a shot of Toradol today to help with pain and inflammation. Recommend start oral Prednisone 10mg  tablets- take 4 tablets today and tomorrow and then decrease by 1 tablet every 2 days until finished on day 8. Recommend increase fluid intake. Eat a low protein  (purine) diet (information provided). If pain, swelling does not improve within 48 hours or gets worse, go to the ER for further evaluation. Otherwise, follow-up with Dr. Elvina Mattes, podiatrist, as planned.     ED Prescriptions    Medication Sig Dispense Auth. Provider   predniSONE (DELTASONE) 10 MG tablet Take 4 tablets by mouth on day 1 & 2, then 3 tablets on day 3&4, 2 tablets on day 5&6, 1 tablet on day 7&8. 20 tablet Izzabell Klasen, Nicholes Stairs, NP     Controlled Substance Prescriptions [place] Controlled Substance Registry consulted? No   Katy Apo, NP 01/23/18 2118

## 2018-01-23 NOTE — ED Triage Notes (Signed)
Toes and feet, both sides, painful, red, and edematous x1 week. Family hx of gout.

## 2018-02-14 ENCOUNTER — Encounter: Payer: Self-pay | Admitting: *Deleted

## 2018-02-15 ENCOUNTER — Encounter
Admission: RE | Disposition: A | Discharge status: Home / Self Care | Payer: Self-pay | Source: Ambulatory Visit | Attending: Unknown Physician Specialty

## 2018-02-15 ENCOUNTER — Encounter: Payer: Self-pay | Admitting: *Deleted

## 2018-02-15 ENCOUNTER — Ambulatory Visit: Payer: Medicare Other | Admitting: Anesthesiology

## 2018-02-15 ENCOUNTER — Ambulatory Visit
Admission: RE | Admit: 2018-02-15 | Discharge: 2018-02-15 | Disposition: A | Discharge status: Home / Self Care | Payer: Medicare Other | Source: Ambulatory Visit | Attending: Unknown Physician Specialty | Admitting: Unknown Physician Specialty

## 2018-02-15 DIAGNOSIS — D122 Benign neoplasm of ascending colon: Secondary | ICD-10-CM | POA: Insufficient documentation

## 2018-02-15 DIAGNOSIS — E785 Hyperlipidemia, unspecified: Secondary | ICD-10-CM | POA: Diagnosis not present

## 2018-02-15 DIAGNOSIS — Z7982 Long term (current) use of aspirin: Secondary | ICD-10-CM | POA: Diagnosis not present

## 2018-02-15 DIAGNOSIS — D125 Benign neoplasm of sigmoid colon: Secondary | ICD-10-CM | POA: Diagnosis not present

## 2018-02-15 DIAGNOSIS — Z7951 Long term (current) use of inhaled steroids: Secondary | ICD-10-CM | POA: Diagnosis not present

## 2018-02-15 DIAGNOSIS — Z1211 Encounter for screening for malignant neoplasm of colon: Secondary | ICD-10-CM | POA: Diagnosis not present

## 2018-02-15 DIAGNOSIS — D123 Benign neoplasm of transverse colon: Secondary | ICD-10-CM | POA: Insufficient documentation

## 2018-02-15 DIAGNOSIS — Z86010 Personal history of colonic polyps: Secondary | ICD-10-CM | POA: Diagnosis not present

## 2018-02-15 DIAGNOSIS — G4730 Sleep apnea, unspecified: Secondary | ICD-10-CM | POA: Diagnosis not present

## 2018-02-15 DIAGNOSIS — D124 Benign neoplasm of descending colon: Secondary | ICD-10-CM | POA: Diagnosis not present

## 2018-02-15 DIAGNOSIS — I739 Peripheral vascular disease, unspecified: Secondary | ICD-10-CM | POA: Diagnosis not present

## 2018-02-15 DIAGNOSIS — Z79890 Hormone replacement therapy (postmenopausal): Secondary | ICD-10-CM | POA: Insufficient documentation

## 2018-02-15 DIAGNOSIS — Z8546 Personal history of malignant neoplasm of prostate: Secondary | ICD-10-CM | POA: Insufficient documentation

## 2018-02-15 DIAGNOSIS — Z85820 Personal history of malignant melanoma of skin: Secondary | ICD-10-CM | POA: Diagnosis not present

## 2018-02-15 DIAGNOSIS — E039 Hypothyroidism, unspecified: Secondary | ICD-10-CM | POA: Diagnosis not present

## 2018-02-15 DIAGNOSIS — I10 Essential (primary) hypertension: Secondary | ICD-10-CM | POA: Insufficient documentation

## 2018-02-15 DIAGNOSIS — M1990 Unspecified osteoarthritis, unspecified site: Secondary | ICD-10-CM | POA: Diagnosis not present

## 2018-02-15 DIAGNOSIS — K219 Gastro-esophageal reflux disease without esophagitis: Secondary | ICD-10-CM | POA: Diagnosis not present

## 2018-02-15 DIAGNOSIS — Z79899 Other long term (current) drug therapy: Secondary | ICD-10-CM | POA: Insufficient documentation

## 2018-02-15 DIAGNOSIS — K635 Polyp of colon: Secondary | ICD-10-CM | POA: Diagnosis not present

## 2018-02-15 HISTORY — DX: Barrett's esophagus without dysplasia: K22.70

## 2018-02-15 HISTORY — PX: COLONOSCOPY WITH PROPOFOL: SHX5780

## 2018-02-15 LAB — HM COLONOSCOPY

## 2018-02-15 SURGERY — COLONOSCOPY WITH PROPOFOL
Anesthesia: General

## 2018-02-15 MED ORDER — SODIUM CHLORIDE 0.9 % IV SOLN: INTRAVENOUS | Status: DC

## 2018-02-15 MED ORDER — PROPOFOL 500 MG/50ML IV EMUL
INTRAVENOUS | Status: AC
  Filled 2018-02-15: qty 50

## 2018-02-15 MED ORDER — FENTANYL CITRATE (PF) 100 MCG/2ML IJ SOLN
INTRAMUSCULAR | Status: DC | PRN
  Administered 2018-02-15 (×2): 50 ug via INTRAVENOUS

## 2018-02-15 MED ORDER — SODIUM CHLORIDE 0.9 % IV SOLN
INTRAVENOUS | Status: DC
  Administered 2018-02-15: 09:00:00 via INTRAVENOUS

## 2018-02-15 MED ORDER — PROPOFOL 10 MG/ML IV BOLUS
INTRAVENOUS | Status: DC | PRN
  Administered 2018-02-15: 100 mg via INTRAVENOUS

## 2018-02-15 MED ORDER — FENTANYL CITRATE (PF) 100 MCG/2ML IJ SOLN
INTRAMUSCULAR | Status: AC
  Filled 2018-02-15: qty 2

## 2018-02-15 MED ORDER — LIDOCAINE 2% (20 MG/ML) 5 ML SYRINGE
INTRAMUSCULAR | Status: DC | PRN
  Administered 2018-02-15: 30 mg via INTRAVENOUS

## 2018-02-15 MED ORDER — PHENYLEPHRINE HCL 10 MG/ML IJ SOLN
INTRAMUSCULAR | Status: DC | PRN
  Administered 2018-02-15 (×4): 100 ug via INTRAVENOUS

## 2018-02-15 MED ORDER — SODIUM CHLORIDE 0.9 % IJ SOLN
INTRAMUSCULAR | Status: DC | PRN
  Administered 2018-02-15: 10 mL via INTRAVENOUS

## 2018-02-15 MED ORDER — PROPOFOL 500 MG/50ML IV EMUL
INTRAVENOUS | Status: DC | PRN
  Administered 2018-02-15: 180 ug/kg/min via INTRAVENOUS

## 2018-02-15 MED ORDER — PROPOFOL 10 MG/ML IV BOLUS
INTRAVENOUS | Status: AC
  Filled 2018-02-15: qty 20

## 2018-02-15 NOTE — Op Note (Signed)
[facility] Gastroenterology Patient Name: Hector [name] Procedure Date: 02/15/2018 9:33 AM MRN: [id number] Account #: [id number] Date of Birth: 03-Aug-1934 Admit Type: Outpatient Age: 83 Room: [facility] ENDO ROOM 2 Gender: Male Note Status: Finalized Procedure:            Colonoscopy Indications:          High risk colon cancer surveillance: Personal history                        of colonic polyps Providers:            Manya Silvas, MD Referring MD:         Einar Pheasant, MD (Referring MD) Medicines:            Propofol per Anesthesia Complications:        No immediate complications. Procedure:            Pre-Anesthesia Assessment:                       - After reviewing the risks and benefits, the patient                        was deemed in satisfactory condition to undergo the                        procedure.                       After obtaining informed consent, the colonoscope was                        passed under direct vision. Throughout the procedure,                        the patient's blood pressure, pulse, and oxygen                        saturations were monitored continuously. The                        Colonoscope was introduced through the anus and                        advanced to the the cecum, identified by appendiceal                        orifice and ileocecal valve. The colonoscopy was                        performed without difficulty. The patient tolerated the                        procedure well. The quality of the bowel preparation                        was good. Findings:      Many sessile polyps were found in the sigmoid colon, descending colon,       transverse colon and ascending colon. The polyps were small in size.       These polyps were removed with a hot snare. Resection and retrieval were  complete. Seven bottles containing 22 polyps were removed with a hot       snare. One polyp was lifted with saline  injection. Impression:           - Many small polyps in the sigmoid colon, in the                        descending colon, in the transverse colon and in the                        ascending colon, removed with a hot snare. Resected and                        retrieved. Recommendation:       - Await pathology results.                       - The findings and recommendations were discussed with                        the patient's family. Manya Silvas, MD 02/15/2018 10:42:44 AM This report has been signed electronically. Number of Addenda: 0 Note Initiated On: 02/15/2018 9:33 AM Scope Withdrawal Time: 0 hours 38 minutes 59 seconds  Total Procedure Duration: 0 hours 55 minutes 37 seconds       [facility]

## 2018-02-15 NOTE — H&P (Signed)
Primary Care Physician:  Einar Pheasant, MD Primary Gastroenterologist:  Dr. Vira Agar  Pre-Procedure History & Physical: HPI:  Hector [name] is a 83 y.o. male is here for an colonoscopy.  Done for PM history of colon polyps   Past Medical History:  Diagnosis Date  . Anemia   . Arthritis   . Barrett esophagus   . Cancer ([place])    prostate,skin  . Chicken pox   . Diverticulitis   . Dysrhythmia   . GERD (gastroesophageal reflux disease)   . Hyperlipidemia   . Hypertension   . Hypothyroidism   . Melanoma ([place])    Malignant resection  . Sleep apnea   . Ulcer     Past Surgical History:  Procedure Laterality Date  . CARDIAC CATHETERIZATION    . Cataract Surgery Right 02/13/14  . COLON SURGERY  2006-2008-2011   polyps removed  . COLONOSCOPY W/ POLYPECTOMY    . cystocopy  2003  . ESOPHAGOGASTRODUODENOSCOPY (EGD) WITH PROPOFOL N/A 08/04/2016   Procedure: ESOPHAGOGASTRODUODENOSCOPY (EGD) WITH PROPOFOL;  Surgeon: Manya Silvas, MD;  Location: [facility] ENDOSCOPY;  Service: Endoscopy;  Laterality: N/A;  . HEMORRHOID SURGERY    . PROSTATE SURGERY    . sleep study      Prior to Admission medications   Medication Sig Start Date End Date Taking? Authorizing Provider  amLODipine (NORVASC) 5 MG tablet Take 1 tablet (5 mg total) by mouth daily. 11/16/17  Yes Einar Pheasant, MD  aspirin 81 MG tablet Take 81 mg by mouth daily.   Yes [provider]  hydrochlorothiazide (HYDRODIURIL) 25 MG tablet Take 1 tablet (25 mg total) by mouth daily. 11/16/17  Yes Einar Pheasant, MD  irbesartan (AVAPRO) 300 MG tablet Take 1 tablet (300 mg total) by mouth daily. 11/16/17  Yes Einar Pheasant, MD  levothyroxine (SYNTHROID, LEVOTHROID) 88 MCG tablet TAKE 1 TABLET BY MOUTH DAILY 10/25/17  Yes Einar Pheasant, MD  omeprazole (PRILOSEC) 20 MG capsule TAKE ONE CAPSULE BY MOUTH TWICE A DAY 10/25/17  Yes Einar Pheasant, MD  acetaminophen (TYLENOL) 500 MG tablet Take 500 mg by mouth every 6 (six) hours as  needed.    [provider]  Calcium Carb-Cholecalciferol (CALCIUM PLUS D3 ABSORBABLE) [phone] MG-UNIT CAPS Take 1 capsule by mouth daily with breakfast. 06/29/17 10/27/17  Milinda Pointer, MD  fluticasone [facility]) 50 MCG/ACT nasal spray TWO PUFFS IN EACH NOSTRIL ONCE A DAY 06/04/15   Einar Pheasant, MD  hyoscyamine (LEVSIN, ANASPAZ) 0.125 MG tablet Take 0.125 mg by mouth as needed.    [provider]  L-Lysine 500 MG TABS Take by mouth daily.    [provider]  predniSONE (DELTASONE) 10 MG tablet Take 4 tablets by mouth on day 1 & 2, then 3 tablets on day 3&4, 2 tablets on day 5&6, 1 tablet on day 7&8. Patient not taking: Reported on 02/15/2018 01/23/18   Katy Apo, NP  rosuvastatin (CRESTOR) 10 MG tablet Take 1 tablet (10 mg total) by mouth daily. Patient not taking: Reported on 02/15/2018 11/16/17   Einar Pheasant, MD  traMADol (ULTRAM) 50 MG tablet Take 1 tablet (50 mg total) by mouth every 8 (eight) hours as needed for severe pain. 11/21/17 05/20/18  Milinda Pointer, MD  valACYclovir (VALTREX) 1000 MG tablet TAKE 1 TABLET BY MOUTH EVERY DAY 10/25/17   Einar Pheasant, MD    Allergies as of 02/10/2018 - Review Complete 01/23/2018  Allergen Reaction Noted  . Tape Other (See Comments) 09/28/2012    Family  History  Problem Relation Age of Onset  . Liver disease Mother   . Heart disease Father   . Kidney disease Father     Social History   Socioeconomic History  . Marital status: Married    Spouse name: Not on file  . Number of children: Not on file  . Years of education: Not on file  . Highest education level: Not on file  Occupational History  . Not on file  Social Needs  . Financial resource strain: Not on file  . Food insecurity:    Worry: Not on file    Inability: Not on file  . Transportation needs:    Medical: Not on file    Non-medical: Not on file  Tobacco Use  . Smoking status: Never Smoker  . Smokeless tobacco: Never Used   Substance and Sexual Activity  . Alcohol use: No    Alcohol/week: 0.0 oz  . Drug use: No  . Sexual activity: Never  Lifestyle  . Physical activity:    Days per week: Not on file    Minutes per session: Not on file  . Stress: Not on file  Relationships  . Social connections:    Talks on phone: Not on file    Gets together: Not on file    Attends religious service: Not on file    Active member of club or organization: Not on file    Attends meetings of clubs or organizations: Not on file    Relationship status: Not on file  . Intimate partner violence:    Fear of current or ex partner: Not on file    Emotionally abused: Not on file    Physically abused: Not on file    Forced sexual activity: Not on file  Other Topics Concern  . Not on file  Social History Narrative  . Not on file    Review of Systems: See HPI, otherwise negative ROS  Physical Exam: BP (!) 158/72   Pulse 65   Temp 98.8 F (37.1 C) (Tympanic)   Resp 14   Ht 5\' 8"  (1.727 m)   Wt 111.1 kg (245 lb)   SpO2 100%   BMI 37.25 kg/m  General:   Alert,  pleasant and cooperative in NAD Head:  Normocephalic and atraumatic. Neck:  Supple; no masses or thyromegaly. Lungs:  Clear throughout to auscultation.    Heart:  Regular rate and rhythm. Abdomen:  Soft, nontender and nondistended. Normal bowel sounds, without guarding, and without rebound.   Neurologic:  Alert and  oriented x4;  grossly normal neurologically.  Impression/Plan: Hector [name] is here for an colonoscopy to be performed for [facility] colon polyps.  Risks, benefits, limitations, and alternatives regarding  colonoscopy have been reviewed with the patient.  Questions have been answered.  All parties agreeable.   Gaylyn Cheers, MD  02/15/2018, 9:29 AM

## 2018-02-15 NOTE — Anesthesia Postprocedure Evaluation (Signed)
Anesthesia Post Note  Patient: Hector [name]  Procedure(s) Performed: COLONOSCOPY WITH PROPOFOL (N/A )  Patient location during evaluation: Endoscopy Anesthesia Type: General Level of consciousness: awake and alert Pain management: pain level controlled Vital Signs Assessment: post-procedure vital signs reviewed and stable Respiratory status: spontaneous breathing, nonlabored ventilation and respiratory function stable Cardiovascular status: blood pressure returned to baseline and stable Postop Assessment: no apparent nausea or vomiting Anesthetic complications: no     Last Vitals:  Vitals:   02/15/18 1055 02/15/18 1115  BP: (!) 109/50 123/63  Pulse:    Resp:    Temp:    SpO2:      Last Pain:  Vitals:   02/15/18 1045  TempSrc: Tympanic  PainSc:                  Alphonsus Sias

## 2018-02-15 NOTE — Anesthesia Preprocedure Evaluation (Signed)
Anesthesia Evaluation  Patient identified by MRN, date of birth, ID band Patient awake    Reviewed: Allergy & Precautions, H&P , NPO status , reviewed documented beta blocker date and time   Airway Mallampati: III  TM Distance: >3 FB Neck ROM: limited    Dental  (+) Upper Dentures, Chipped   Pulmonary sleep apnea ,    Pulmonary exam normal        Cardiovascular hypertension, + Peripheral Vascular Disease  Normal cardiovascular exam+ dysrhythmias      Neuro/Psych  Neuromuscular disease    GI/Hepatic GERD  Controlled,  Endo/Other  Hypothyroidism   Renal/GU      Musculoskeletal  (+) Arthritis ,   Abdominal   Peds  Hematology  (+) anemia ,   Anesthesia Other Findings Past Medical History: No date: Anemia No date: Arthritis No date: Barrett esophagus No date: Cancer ([place])     Comment:  prostate,skin No date: Chicken pox No date: Diverticulitis No date: Dysrhythmia No date: GERD (gastroesophageal reflux disease) No date: Hyperlipidemia No date: Hypertension No date: Hypothyroidism No date: Melanoma ([place])     Comment:  Malignant resection No date: Sleep apnea No date: Ulcer  Past Surgical History: No date: CARDIAC CATHETERIZATION 02/13/14: Cataract Surgery; Right 2006-2008-2011: COLON SURGERY     Comment:  polyps removed No date: COLONOSCOPY W/ POLYPECTOMY 2003: cystocopy 08/04/2016: ESOPHAGOGASTRODUODENOSCOPY (EGD) WITH PROPOFOL; N/A     Comment:  Procedure: ESOPHAGOGASTRODUODENOSCOPY (EGD) WITH               PROPOFOL;  Surgeon: Manya Silvas, MD;  Location[facility]              ENDOSCOPY;  Service: Endoscopy;  Laterality: N/A; No date: HEMORRHOID SURGERY No date: PROSTATE SURGERY No date: sleep study  BMI    Body Mass Index:  37.25 kg/m      Reproductive/Obstetrics                             Anesthesia Physical Anesthesia Plan  ASA: III  Anesthesia Plan: General    Post-op Pain Management:    Induction:   PONV Risk Score and Plan: Treatment may vary due to age or medical condition and TIVA  Airway Management Planned:   Additional Equipment:   Intra-op Plan:   Post-operative Plan:   Informed Consent: I have reviewed the patients History and Physical, chart, labs and discussed the procedure including the risks, benefits and alternatives for the proposed anesthesia with the patient or authorized representative who has indicated his/her understanding and acceptance.   Dental Advisory Given  Plan Discussed with: CRNA  Anesthesia Plan Comments:         Anesthesia Quick Evaluation

## 2018-02-15 NOTE — Transfer of Care (Signed)
Immediate Anesthesia Transfer of Care Note  Patient: Hector [name]  Procedure(s) Performed: COLONOSCOPY WITH PROPOFOL (N/A )  Patient Location: PACU and Endoscopy Unit  Anesthesia Type:General  Level of Consciousness: sedated  Airway & Oxygen Therapy: Patient Spontanous Breathing and Patient connected to nasal cannula oxygen  Post-op Assessment: Report given to RN and Post -op Vital signs reviewed and stable  Post vital signs: Reviewed and stable  Last Vitals:  Vitals Value Taken Time  BP 99/44 02/15/2018 10:46 AM  Temp 36.2 C 02/15/2018 10:45 AM  Pulse 57 02/15/2018 10:47 AM  Resp 11 02/15/2018 10:47 AM  SpO2 99 % 02/15/2018 10:47 AM  Vitals shown include unvalidated device data.  Last Pain:  Vitals:   02/15/18 1045  TempSrc: Tympanic  PainSc:          Complications: No apparent anesthesia complications

## 2018-02-15 NOTE — Anesthesia Post-op Follow-up Note (Signed)
Anesthesia QCDR form completed.        

## 2018-02-16 ENCOUNTER — Encounter: Payer: Self-pay | Admitting: Unknown Physician Specialty

## 2018-02-16 LAB — SURGICAL PATHOLOGY

## 2018-02-20 ENCOUNTER — Other Ambulatory Visit: Payer: Self-pay | Admitting: Internal Medicine

## 2018-03-20 ENCOUNTER — Other Ambulatory Visit: Payer: Self-pay | Admitting: Internal Medicine

## 2018-03-21 ENCOUNTER — Ambulatory Visit (HOSPITAL_BASED_OUTPATIENT_CLINIC_OR_DEPARTMENT_OTHER): Payer: Medicare Other | Admitting: Pain Medicine

## 2018-03-21 ENCOUNTER — Ambulatory Visit
Admission: RE | Admit: 2018-03-21 | Discharge: 2018-03-21 | Disposition: A | Discharge status: Home / Self Care | Payer: Medicare Other | Source: Ambulatory Visit | Attending: Pain Medicine | Admitting: Pain Medicine

## 2018-03-21 ENCOUNTER — Telehealth: Payer: Self-pay | Admitting: *Deleted

## 2018-03-21 ENCOUNTER — Other Ambulatory Visit: Payer: Self-pay

## 2018-03-21 ENCOUNTER — Encounter: Payer: Self-pay | Admitting: Pain Medicine

## 2018-03-21 VITALS — BP 133/85 | HR 65 | Temp 98.2°F | Resp 14 | Ht 68.0 in | Wt 245.0 lb

## 2018-03-21 DIAGNOSIS — G8918 Other acute postprocedural pain: Secondary | ICD-10-CM | POA: Insufficient documentation

## 2018-03-21 DIAGNOSIS — M47817 Spondylosis without myelopathy or radiculopathy, lumbosacral region: Secondary | ICD-10-CM | POA: Insufficient documentation

## 2018-03-21 DIAGNOSIS — I493 Ventricular premature depolarization: Secondary | ICD-10-CM | POA: Insufficient documentation

## 2018-03-21 DIAGNOSIS — G8929 Other chronic pain: Secondary | ICD-10-CM

## 2018-03-21 DIAGNOSIS — M47816 Spondylosis without myelopathy or radiculopathy, lumbar region: Secondary | ICD-10-CM

## 2018-03-21 DIAGNOSIS — Z79899 Other long term (current) drug therapy: Secondary | ICD-10-CM | POA: Diagnosis not present

## 2018-03-21 DIAGNOSIS — M545 Low back pain: Secondary | ICD-10-CM | POA: Diagnosis not present

## 2018-03-21 DIAGNOSIS — M4317 Spondylolisthesis, lumbosacral region: Secondary | ICD-10-CM | POA: Diagnosis not present

## 2018-03-21 DIAGNOSIS — M5442 Lumbago with sciatica, left side: Secondary | ICD-10-CM

## 2018-03-21 DIAGNOSIS — M4300 Spondylolysis, site unspecified: Secondary | ICD-10-CM

## 2018-03-21 DIAGNOSIS — M4310 Spondylolisthesis, site unspecified: Secondary | ICD-10-CM

## 2018-03-21 DIAGNOSIS — Z7951 Long term (current) use of inhaled steroids: Secondary | ICD-10-CM | POA: Diagnosis not present

## 2018-03-21 HISTORY — DX: Other acute postprocedural pain: G89.18

## 2018-03-21 MED ORDER — ROPIVACAINE HCL 2 MG/ML IJ SOLN
9.0000 mL | Freq: Once | INTRAMUSCULAR | Status: AC
  Administered 2018-03-21: 9 mL via PERINEURAL
  Filled 2018-03-21: qty 10

## 2018-03-21 MED ORDER — HYDROCODONE-ACETAMINOPHEN 5-325 MG PO TABS: 1.0000 | ORAL_TABLET | Freq: Three times a day (TID) | ORAL | 0 refills | Status: AC | PRN

## 2018-03-21 MED ORDER — HYDROCODONE-ACETAMINOPHEN 5-325 MG PO TABS
1.0000 | ORAL_TABLET | Freq: Three times a day (TID) | ORAL | 0 refills | Status: AC | PRN
Start: 2018-03-28 — End: 2018-04-04

## 2018-03-21 MED ORDER — LACTATED RINGERS IV SOLN
1000.0000 mL | Freq: Once | INTRAVENOUS | Status: AC
  Administered 2018-03-21: 1000 mL via INTRAVENOUS

## 2018-03-21 MED ORDER — MIDAZOLAM HCL 5 MG/5ML IJ SOLN
1.0000 mg | INTRAMUSCULAR | Status: DC | PRN
  Administered 2018-03-21: 3 mg via INTRAVENOUS
  Filled 2018-03-21: qty 5

## 2018-03-21 MED ORDER — LIDOCAINE HCL 2 % IJ SOLN
20.0000 mL | Freq: Once | INTRAMUSCULAR | Status: AC
  Administered 2018-03-21: 400 mg
  Filled 2018-03-21: qty 40

## 2018-03-21 MED ORDER — TRIAMCINOLONE ACETONIDE 40 MG/ML IJ SUSP
40.0000 mg | Freq: Once | INTRAMUSCULAR | Status: AC
  Administered 2018-03-21: 40 mg
  Filled 2018-03-21: qty 1

## 2018-03-21 MED ORDER — FENTANYL CITRATE (PF) 100 MCG/2ML IJ SOLN
25.0000 ug | INTRAMUSCULAR | Status: DC | PRN
  Administered 2018-03-21: 100 ug via INTRAVENOUS
  Filled 2018-03-21: qty 2

## 2018-03-21 NOTE — Progress Notes (Signed)
Safety precautions to be maintained throughout the outpatient stay will include: orient to surroundings, keep bed in low position, maintain call bell within reach at all times, provide assistance with transfer out of bed and ambulation.  

## 2018-03-21 NOTE — Patient Instructions (Signed)
___________________________________________________________________________________________  Post-Radiofrequency (RF) Discharge Instructions  You have just completed a Radiofrequency Neurotomy.  The following instructions will provide you with information and guidelines for self-care upon discharge.  If at any time you have questions or concerns please call your physician. DO NOT DRIVE YOURSELF!!  Instructions:  Apply ice: Fill a plastic sandwich bag with crushed ice. Cover it with a small towel and apply to injection site. Apply for 15 minutes then remove x 15 minutes. Repeat sequence on day of procedure, until you go to bed. The purpose is to minimize swelling and discomfort after procedure.  Apply heat: Apply heat to procedure site starting the day following the procedure. The purpose is to treat any soreness and discomfort from the procedure.  Food intake: No eating limitations, unless stipulated above.  Nevertheless, if you have had sedation, you may experience some nausea.  In this case, it may be wise to wait at least two hours prior to resuming regular diet.  Physical activities: Keep activities to a minimum for the first 8 hours after the procedure. For the first 24 hours after the procedure, do not drive a motor vehicle,  Operate heavy machinery, power tools, or handle any weapons.  Consider walking with the use of an assistive device or accompanied by an adult for the first 24 hours.  Do not drink alcoholic beverages including beer.  Do not make any important decisions or sign any legal documents. Go home and rest today.  Resume activities tomorrow, as tolerated.  Use caution in moving about as you may experience mild leg weakness.  Use caution in cooking, use of household electrical appliances and climbing steps.  Driving: If you have received any sedation, you are not allowed to drive for 24 hours after your procedure.  Blood thinner: Restart your blood thinner 6 hours after your  procedure. (Only for those taking blood thinners)  Insulin: As soon as you can eat, you may resume your normal dosing schedule. (Only for those taking insulin)  Medications: May resume pre-procedure medications.  Do not take any drugs, other than what has been prescribed to you.  Infection prevention: Keep procedure site clean and dry.  Post-procedure Pain Diary: Extremely important that this be done correctly and accurately. Recorded information will be used to determine the next step in treatment.  Pain evaluated is that of treated area only. Do not include pain from an untreated area.  Complete every hour, on the hour, for the initial 8 hours. Set an alarm to help you do this part accurately.  Do not go to sleep and have it completed later. It will not be accurate.  Follow-up appointment: Keep your follow-up appointment after the procedure. Usually 2 weeks for most procedures. (6 weeks in the case of radiofrequency.) Bring you pain diary.   Expect:  From numbing medicine (AKA: Local Anesthetics): Numbness or decrease in pain.  Onset: Full effect within 15 minutes of injected.  Duration: It will depend on the type of local anesthetic used. On the average, 1 to 8 hours.   From steroids: Decrease in swelling or inflammation. Once inflammation is improved, relief of the pain will follow.  Onset of benefits: Depends on the amount of swelling present. The more swelling, the longer it will take for the benefits to be seen. In some cases, up to 10 days.  Duration: Steroids will stay in the system x 2 weeks. Duration of benefits will depend on multiple posibilities including persistent irritating factors.  From procedure: Some   discomfort is to be expected once the numbing medicine wears off. This should be minimal if ice and heat are applied as instructed.  Call if:  You experience numbness and weakness that gets worse with time, as opposed to wearing off.  He experience any unusual  bleeding, difficulty breathing, or loss of the ability to control your bowel and bladder. (This applies to Spinal procedures only)  You experience any redness, swelling, heat, red streaks, elevated temperature, fever, or any other signs of a possible infection.  Emergency Numbers:  Durning business hours (Monday - Thursday, 8:00 AM - 4:00 PM) (Friday, 9:00 AM - 12:00 Noon): [phone]  After hours: [phone] ____________________________________________________________________________________________   ____________________________________________________________________________________________  Preparing for Procedure with Sedation  Instructions: . Oral Intake: Do not eat or drink anything for at least 8 hours prior to your procedure. . Transportation: Public transportation is not allowed. Bring an adult driver. The driver must be physically present in our waiting room before any procedure can be started. . Physical Assistance: Bring an adult physically capable of assisting you, in the event you need help. This adult should keep you company at home for at least 6 hours after the procedure. . Blood Pressure Medicine: Take your blood pressure medicine with a sip of water the morning of the procedure. . Blood thinners: Notify our staff if you are taking any blood thinners. Depending on which one you take, there will be specific instructions on how and when to stop it. . Diabetics on insulin: Notify the staff so that you can be scheduled 1st case in the morning. If your diabetes requires high dose insulin, take only  of your normal insulin dose the morning of the procedure and notify the staff that you have done so. . Preventing infections: Shower with an antibacterial soap the morning of your procedure. . Build-up your immune system: Take 1000 mg of Vitamin C with every meal (3 times a day) the day prior to your procedure. . Antibiotics: Inform the staff if you have a condition or  reason that requires you to take antibiotics before dental procedures. . Pregnancy: If you are pregnant, call and cancel the procedure. . Sickness: If you have a cold, fever, or any active infections, call and cancel the procedure. . Arrival: You must be in the facility at least 30 minutes prior to your scheduled procedure. . Children: Do not bring children with you. . Dress appropriately: Bring dark clothing that you would not mind if they get stained. . Valuables: Do not bring any jewelry or valuables.  Procedure appointments are reserved for interventional treatments only. . No Prescription Refills. . No medication changes will be discussed during procedure appointments. . No disability issues will be discussed.  Reasons to call and reschedule or cancel your procedure: (Following these recommendations will minimize the risk of a serious complication.) . Surgeries: Avoid having procedures within 2 weeks of any surgery. (Avoid for 2 weeks before or after any surgery). . Flu Shots: Avoid having procedures within 2 weeks of a flu shots or . (Avoid for 2 weeks before or after immunizations). . Barium: Avoid having a procedure within 7-10 days after having had a radiological study involving the use of radiological contrast. (Myelograms, Barium swallow or enema study). . Heart attacks: Avoid any elective procedures or surgeries for the initial 6 months after a "Myocardial Infarction" (Heart Attack). . Blood thinners: It is imperative that you stop these medications before procedures. Let us know if you if you   take any blood thinner.  . Infection: Avoid procedures during or within two weeks of an infection (including chest colds or gastrointestinal problems). Symptoms associated with infections include: Localized redness, fever, chills, night sweats or profuse sweating, burning sensation when voiding, cough, congestion, stuffiness, runny nose, sore throat, diarrhea, nausea, vomiting, cold or Flu  symptoms, recent or current infections. It is specially important if the infection is over the area that we intend to treat. . Heart and lung problems: Symptoms that may suggest an active cardiopulmonary problem include: cough, chest pain, breathing difficulties or shortness of breath, dizziness, ankle swelling, uncontrolled high or unusually low blood pressure, and/or palpitations. If you are experiencing any of these symptoms, cancel your procedure and contact your primary care physician for an evaluation.  Remember:  Regular Business hours are:  Monday to Thursday 8:00 AM to 4:00 PM  Provider's Schedule: Hayat Warbington, MD:  Procedure days: Tuesday and Thursday 7:30 AM to 4:00 PM  Bilal Lateef, MD:  Procedure days: Monday and Wednesday 7:30 AM to 4:00 PM ____________________________________________________________________________________________    

## 2018-03-21 NOTE — Telephone Encounter (Signed)
Left voicemail with Barbaraann Share that we would like for Mr Keenum to f/up with his PCP re; abnormal heart rhythm during procedure today.  We are also reaching out to primary re; history of abnormal heart rhythm.

## 2018-03-21 NOTE — Telephone Encounter (Signed)
Mr. Reinhardt was seen at the pain clinic today for a procedure for low back pain. While in the procedure room and under moderate sedation the staff began to notice that Mr. Prows was having some PVCs. When we questioned him about this he said he has never been told that but has been told he has A-fib( but this is not documented in his record). He remained asymptomatic during his visit and the PVCs were not as often during his recovery phase but were still present. Mr. Gallaga was instructed to follow-up with you about this and I just wanted to give you a heads up about what happened. Thank you and please contact us for any questions.                                                                   Anderson Malta, RN

## 2018-03-21 NOTE — Progress Notes (Signed)
Patient's Name: Hector [name]  MRN: [id number]  Referring Provider: Milinda Pointer, MD  DOB: 1934/08/14  PCP: Einar Pheasant, MD  DOS: 03/21/2018  Note by: Gaspar Cola, MD  Service setting: Ambulatory outpatient  Specialty: Interventional Pain Management  Patient type: Established  Location: [facility] (AMB) Pain Management Facility  Visit type: Interventional Procedure   Primary Reason for Visit: Interventional Pain Management Treatment. CC: No chief complaint on file.  Procedure:          Anesthesia, Analgesia, Anxiolysis:  Type: Thermal Lumbar Facet, Medial Branch Radiofrequency Ablation/Neurotomy Level: L2, L3, L4, L5, & S1 Medial Branch Level(s). These levels will denervate the L3-4, L4-5, and the L5-S1 lumbar facet joints. Primary Purpose: Therapeutic Region: Posterolateral Lumbosacral Spine Laterality: Left  Type: Moderate (Conscious) Sedation combined with Local Anesthesia Indication(s): Analgesia and Anxiety Route: Intravenous (IV) IV Access: Secured Sedation: Meaningful verbal contact was maintained at all times during the procedure  Local Anesthetic: Lidocaine 1-2%   Indications: 1. Spondylosis without myelopathy or radiculopathy, lumbosacral region   2. Lumbar facet syndrome (Bilateral) (L>R)   3. Lumbar facet osteoarthritis (Bilateral)   4. Lumbar facet arthropathy (Bilateral)   5. Chronic low back pain (Primary Area of Pain) (Bilateral) (L>R)   6. L5-S1 bilateral pars defect with spondylolisthesis    [name] has been dealing with the above chronic pain for longer than three months and has either failed to respond, was unable to tolerate, or simply did not get enough benefit from other more conservative therapies including, but not limited to: 1. Over-the-counter medications 2. Anti-inflammatory medications 3. Muscle relaxants 4. Membrane stabilizers 5. Opioids 6. Physical therapy 7. Modalities (Heat, ice, etc.) 8. Invasive techniques such as nerve blocks. Mr.  Hector [name] has attained more than 50% relief of the pain from a series of diagnostic injections conducted in separate occasions.  Pain Score: Pre-procedure: 3 /10 Post-procedure: 0-No pain/10  Pre-op Assessment:  [name] is a 83 y.o. (year old), male patient, seen today for interventional treatment. He  has a past surgical history that includes Prostate surgery; cystocopy (2003); Hemorrhoid surgery; Cardiac catheterization; sleep study; Colon surgery (2006-2008-2011); Cataract Surgery (Right, 02/13/14); Esophagogastroduodenoscopy (egd) with propofol (N/A, 08/04/2016); Colonoscopy w/ polypectomy; and Colonoscopy with propofol (N/A, 02/15/2018). [name] has a current medication list which includes the following prescription(s): acetaminophen, amlodipine, aspirin, fluticasone, hydrochlorothiazide, hyoscyamine, irbesartan, l-lysine, levothyroxine, omeprazole, tramadol, valacyclovir, calcium plus d3 absorbable, hydrocodone-acetaminophen, hydrocodone-acetaminophen, prednisone, and rosuvastatin, and the following Facility-Administered Medications: fentanyl and midazolam. His primarily concern today is the No chief complaint on file.  Initial Vital Signs:  Pulse/HCG Rate: 65ECG Heart Rate: 88 today the patient was observed to be having some PVCs. I asked him if he had any history of cardiac rhythm problems and he indicated that he has A-fib. However, there is no history on his records about this atrial fibrillation and he is not taking any blood thinners. The patient was instructed to contact his primary care physician to have this investigated. The PVCs today seem to be asymptomatic. We did keep him on oxygen at 3 L/m during the procedure. Temp: 98 F (36.7 C) Resp: 20 BP: (!) 154/63 SpO2: 97 %  BMI: Estimated body mass index is 37.25 kg/m as calculated from the following:   Height as of this encounter: 5\' 8"  (1.727 m).   Weight as of this encounter: 245 lb (111.1 kg).  Risk Assessment: Allergies: Reviewed.  He is allergic to tape.  Allergy Precautions: None required Coagulopathies: Reviewed. None identified.  Blood-thinner therapy: None at this time Active Infection(s): Reviewed. None identified. [name] is afebrile  Site Confirmation: [name] was asked to confirm the procedure and laterality before marking the site Procedure checklist: Completed Consent: Before the procedure and under the influence of no sedative(s), amnesic(s), or anxiolytics, the patient was informed of the treatment options, risks and possible complications. To fulfill our ethical and legal obligations, as recommended by the American Medical Association's Code of Ethics, I have informed the patient of my clinical impression; the nature and purpose of the treatment or procedure; the risks, benefits, and possible complications of the intervention; the alternatives, including doing nothing; the risk(s) and benefit(s) of the alternative treatment(s) or procedure(s); and the risk(s) and benefit(s) of doing nothing. The patient was provided information about the general risks and possible complications associated with the procedure. These may include, but are not limited to: failure to achieve desired goals, infection, bleeding, organ or nerve damage, allergic reactions, paralysis, and death. In addition, the patient was informed of those risks and complications associated to Spine-related procedures, such as failure to decrease pain; infection (i.e.: Meningitis, epidural or intraspinal abscess); bleeding (i.e.: epidural hematoma, subarachnoid hemorrhage, or any other type of intraspinal or peri-dural bleeding); organ or nerve damage (i.e.: Any type of peripheral nerve, nerve root, or spinal cord injury) with subsequent damage to sensory, motor, and/or autonomic systems, resulting in permanent pain, numbness, and/or weakness of one or several areas of the body; allergic reactions; (i.e.: anaphylactic reaction); and/or death. Furthermore, the  patient was informed of those risks and complications associated with the medications. These include, but are not limited to: allergic reactions (i.e.: anaphylactic or anaphylactoid reaction(s)); adrenal axis suppression; blood sugar elevation that in diabetics may result in ketoacidosis or comma; water retention that in patients with history of congestive heart failure may result in shortness of breath, pulmonary edema, and decompensation with resultant heart failure; weight gain; swelling or edema; medication-induced neural toxicity; particulate matter embolism and blood vessel occlusion with resultant organ, and/or nervous system infarction; and/or aseptic necrosis of one or more joints. Finally, the patient was informed that Medicine is not an exact science; therefore, there is also the possibility of unforeseen or unpredictable risks and/or possible complications that may result in a catastrophic outcome. The patient indicated having understood very clearly. We have given the patient no guarantees and we have made no promises. Enough time was given to the patient to ask questions, all of which were answered to the patient's satisfaction. Mr. Duell has indicated that he wanted to continue with the procedure. Attestation: I, the ordering provider, attest that I have discussed with the patient the benefits, risks, side-effects, alternatives, likelihood of achieving goals, and potential problems during recovery for the procedure that I have provided informed consent. Date  Time: 03/21/2018  9:45 AM  Pre-Procedure Preparation:  Monitoring: As per clinic protocol. Respiration, ETCO2, SpO2, BP, heart rate and rhythm monitor placed and checked for adequate function Safety Precautions: Patient was assessed for positional comfort and pressure points before starting the procedure. Time-out: I initiated and conducted the "Time-out" before starting the procedure, as per protocol. The patient was asked to participate  by confirming the accuracy of the "Time Out" information. Verification of the correct person, site, and procedure were performed and confirmed by me, the nursing staff, and the patient. "Time-out" conducted as per Joint Commission's Universal Protocol (UP.01.01.01). Time: 1120  Description of Procedure:          Position: Prone Laterality:  Left Levels:  L2, L3, L4, L5, & S1 Medial Branch Level(s), at the L3-4, L4-5, and the L5-S1 lumbar facet joints. Area Prepped: Lumbosacral Prepping solution: ChloraPrep (2% chlorhexidine gluconate and 70% isopropyl alcohol) Safety Precautions: Aspiration looking for blood return was conducted prior to all injections. At no point did we inject any substances, as a needle was being advanced. Before injecting, the patient was told to immediately notify me if he was experiencing any new onset of "ringing in the ears, or metallic taste in the mouth". No attempts were made at seeking any paresthesias. Safe injection practices and needle disposal techniques used. Medications properly checked for expiration dates. SDV (single dose vial) medications used. After the completion of the procedure, all disposable equipment used was discarded in the proper designated medical waste containers. Local Anesthesia: Protocol guidelines were followed. The patient was positioned over the fluoroscopy table. The area was prepped in the usual manner. The time-out was completed. The target area was identified using fluoroscopy. A 12-in long, straight, sterile hemostat was used with fluoroscopic guidance to locate the targets for each level blocked. Once located, the skin was marked with an approved surgical skin marker. Once all sites were marked, the skin (epidermis, dermis, and hypodermis), as well as deeper tissues (fat, connective tissue and muscle) were infiltrated with a small amount of a short-acting local anesthetic, loaded on a 10cc syringe with a 25G, 1.5-in  Needle. An appropriate  amount of time was allowed for local anesthetics to take effect before proceeding to the next step. Local Anesthetic: Lidocaine 2.0% The unused portion of the local anesthetic was discarded in the proper designated containers. Technical explanation of process:  Radiofrequency Ablation (RFA) L2 Medial Branch Nerve RFA: The target area for the L2 medial branch is at the junction of the postero-lateral aspect of the superior articular process and the superior, posterior, and medial edge of the transverse process of L3. Under fluoroscopic guidance, a Radiofrequency needle was inserted until contact was made with os over the superior postero-lateral aspect of the pedicular shadow (target area). Sensory and motor testing was conducted to properly adjust the position of the needle. Once satisfactory placement of the needle was achieved, the numbing solution was slowly injected after negative aspiration for blood. 2.0 mL of the nerve block solution was injected without difficulty or complication. After waiting for at least 3 minutes, the ablation was performed. Once completed, the needle was removed intact. L3 Medial Branch Nerve RFA: The target area for the L3 medial branch is at the junction of the postero-lateral aspect of the superior articular process and the superior, posterior, and medial edge of the transverse process of L4. Under fluoroscopic guidance, a Radiofrequency needle was inserted until contact was made with os over the superior postero-lateral aspect of the pedicular shadow (target area). Sensory and motor testing was conducted to properly adjust the position of the needle. Once satisfactory placement of the needle was achieved, the numbing solution was slowly injected after negative aspiration for blood. 2.0 mL of the nerve block solution was injected without difficulty or complication. After waiting for at least 3 minutes, the ablation was performed. Once completed, the needle was removed  intact. L4 Medial Branch Nerve RFA: The target area for the L4 medial branch is at the junction of the postero-lateral aspect of the superior articular process and the superior, posterior, and medial edge of the transverse process of L5. Under fluoroscopic guidance, a Radiofrequency needle was inserted until contact was made with os  over the superior postero-lateral aspect of the pedicular shadow (target area). Sensory and motor testing was conducted to properly adjust the position of the needle. Once satisfactory placement of the needle was achieved, the numbing solution was slowly injected after negative aspiration for blood. 2.0 mL of the nerve block solution was injected without difficulty or complication. After waiting for at least 3 minutes, the ablation was performed. Once completed, the needle was removed intact. L5 Medial Branch Nerve RFA: The target area for the L5 medial branch is at the junction of the postero-lateral aspect of the superior articular process of S1 and the superior, posterior, and medial edge of the sacral ala. Under fluoroscopic guidance, a Radiofrequency needle was inserted until contact was made with os over the superior postero-lateral aspect of the pedicular shadow (target area). Sensory and motor testing was conducted to properly adjust the position of the needle. Once satisfactory placement of the needle was achieved, the numbing solution was slowly injected after negative aspiration for blood. 2.0 mL of the nerve block solution was injected without difficulty or complication. After waiting for at least 3 minutes, the ablation was performed. Once completed, the needle was removed intact. S1 Medial Branch Nerve RFA: The target area for the S1 medial branch is located inferior to the junction of the S1 superior articular process and the L5 inferior articular process, posterior, inferior, and lateral to the 6 o'clock position of the L5-S1 facet joint, just superior to the S1  posterior foramen. Under fluoroscopic guidance, the Radiofrequency needle was advanced until contact was made with os over the Target area. Sensory and motor testing was conducted to properly adjust the position of the needle. Once satisfactory placement of the needle was achieved, the numbing solution was slowly injected after negative aspiration for blood. 2.0 mL of the nerve block solution was injected without difficulty or complication. After waiting for at least 3 minutes, the ablation was performed. Once completed, the needle was removed intact. Radiofrequency lesioning (ablation):  Radiofrequency Generator: NeuroTherm [id number] Sensory Stimulation Parameters: 50 Hz was used to locate & identify the nerve, making sure that the needle was positioned such that there was no sensory stimulation below 0.3 V or above 0.7 V. Motor Stimulation Parameters: 2 Hz was used to evaluate the motor component. Care was taken not to lesion any nerves that demonstrated motor stimulation of the lower extremities at an output of less than 2.5 times that of the sensory threshold, or a maximum of 2.0 V. Lesioning Technique Parameters: Standard Radiofrequency settings. (Not bipolar or pulsed.) Temperature Settings: 80 degrees C Lesioning time: 60 seconds Intra-operative Compliance: Compliant Materials & Medications: Needle(s) (Electrode/Cannula) Type: Teflon-coated, curved tip, Radiofrequency needle(s) Gauge: 22G Length: 10cm Numbing solution: 0.2% PF-Ropivacaine + Triamcinolone (40 mg/mL) diluted to a final concentration of 4 mg of Triamcinolone/mL of Ropivacaine The unused portion of the solution was discarded in the proper designated containers.  Once the entire procedure was completed, the treated area was cleaned, making sure to leave some of the prepping solution back to take advantage of its long term bactericidal properties.  Illustration of the posterior view of the lumbar spine and the posterior neural  structures. Laminae of L2 through S1 are labeled. DPRL5, dorsal primary ramus of L5; DPRS1, dorsal primary ramus of S1; DPR3, dorsal primary ramus of L3; FJ, facet (zygapophyseal) joint L3-L4; I, inferior articular process of L4; LB1, lateral branch of dorsal primary ramus of L1; IAB, inferior articular branches from L3 medial branch (supplies L4-L5 facet  joint); IBP, intermediate branch plexus; MB3, medial branch of dorsal primary ramus of L3; NR3, third lumbar nerve root; S, superior articular process of L5; SAB, superior articular branches from L4 (supplies L4-5 facet joint also); TP3, transverse process of L3.  Vitals:   03/21/18 1158 03/21/18 1209 03/21/18 1219 03/21/18 1229  BP: 134/90 (!) 143/71 109/66 133/85  Pulse:      Resp: 12 13 13 14   Temp:  98.2 F (36.8 C)  98.2 F (36.8 C)  TempSrc:  Temporal    SpO2: 97% 100% 97% 99%  Weight:      Height:        Start Time: 1120 hrs. End Time: 1157 hrs.  Imaging Guidance (Spinal):          Type of Imaging Technique: Fluoroscopy Guidance (Spinal) Indication(s): Assistance in needle guidance and placement for procedures requiring needle placement in or near specific anatomical locations not easily accessible without such assistance. Exposure Time: Please see nurses notes. Contrast: None used. Fluoroscopic Guidance: I was personally present during the use of fluoroscopy. "Tunnel Vision Technique" used to obtain the best possible view of the target area. Parallax error corrected before commencing the procedure. "Direction-depth-direction" technique used to introduce the needle under continuous pulsed fluoroscopy. Once target was reached, antero-posterior, oblique, and lateral fluoroscopic projection used confirm needle placement in all planes. Images permanently stored in EMR. Interpretation: No contrast injected. I personally interpreted the imaging intraoperatively. Adequate needle placement confirmed in multiple planes. Permanent images saved  into the patient's record.  Antibiotic Prophylaxis:   Anti-infectives (From admission, onward)   None     Indication(s): None identified  Post-operative Assessment:  Post-procedure Vital Signs:  Pulse/HCG Rate: 6561 Temp: 98.2 F (36.8 C) Resp: 14 BP: 133/85 SpO2: 99 %  EBL: None  Complications: No immediate post-treatment complications observed by team, or reported by patient.  Note: The patient tolerated the entire procedure well. A repeat set of vitals were taken after the procedure and the patient was kept under observation following institutional policy, for this type of procedure. Post-procedural neurological assessment was performed, showing return to baseline, prior to discharge. The patient was provided with post-procedure discharge instructions, including a section on how to identify potential problems. Should any problems arise concerning this procedure, the patient was given instructions to immediately contact us, at any time, without hesitation. In any case, we plan to contact the patient by telephone for a follow-up status report regarding this interventional procedure.  Comments:  No additional relevant information.  Plan of Care    Imaging Orders     DG C-Arm 1-60 Min-No Report  Procedure Orders     Radiofrequency,Lumbar     Radiofrequency,Lumbar  Medications ordered for procedure: Meds ordered this encounter  Medications  . lidocaine (XYLOCAINE) 2 % (with pres) injection 400 mg  . midazolam (VERSED) 5 MG/5ML injection 1-2 mg    Make sure Flumazenil is available in the pyxis when using this medication. If oversedation occurs, administer 0.2 mg IV over 15 sec. If after 45 sec no response, administer 0.2 mg again over 1 min; may repeat at 1 min intervals; not to exceed 4 doses (1 mg)  . fentaNYL (SUBLIMAZE) injection 25-50 mcg    Make sure Narcan is available in the pyxis when using this medication. In the event of respiratory depression (RR< 8/min): Titrate  NARCAN (naloxone) in increments of 0.1 to 0.2 mg IV at 2-3 minute intervals, until desired degree of reversal.  . lactated ringers infusion  1,000 mL  . ropivacaine (PF) 2 mg/mL (0.2%) (NAROPIN) injection 9 mL  . triamcinolone acetonide (KENALOG-40) injection 40 mg  . HYDROcodone-acetaminophen (NORCO/VICODIN) 5-325 MG tablet    Sig: Take 1 tablet by mouth every 8 (eight) hours as needed for up to 7 days for severe pain.    Dispense:  21 tablet    Refill:  0    For acute post-operative pain. Not to be refilled. To last 7 days.  Marland Kitchen HYDROcodone-acetaminophen (NORCO/VICODIN) 5-325 MG tablet    Sig: Take 1 tablet by mouth every 8 (eight) hours as needed for up to 7 days for severe pain.    Dispense:  21 tablet    Refill:  0    For acute post-operative pain. Not to be refilled. To last 7 days.   Medications administered: We administered lidocaine, midazolam, fentaNYL, lactated ringers, ropivacaine (PF) 2 mg/mL (0.2%), and triamcinolone acetonide.  See the medical record for exact dosing, route, and time of administration.  New Prescriptions   HYDROCODONE-ACETAMINOPHEN (NORCO/VICODIN) 5-325 MG TABLET    Take 1 tablet by mouth every 8 (eight) hours as needed for up to 7 days for severe pain.   HYDROCODONE-ACETAMINOPHEN (NORCO/VICODIN) 5-325 MG TABLET    Take 1 tablet by mouth every 8 (eight) hours as needed for up to 7 days for severe pain.   Disposition: Discharge home  Discharge Date & Time: 03/21/2018; 1233 hrs.   Physician-requested Follow-up: Return for contralateral RFA (2 wks): (R) L-FCT RFA.  Future Appointments  Date Time Provider [place]  03/28/2018  9:00 AM [facility]  03/30/2018  9:30 AM Einar Pheasant, MD [facility]  04/18/2018 10:30 AM Milinda Pointer, MD [facility] None  05/15/2018  9:00 AM Vevelyn Francois, NP [facility] None  05/25/2018  9:00 AM O'Brien-Blaney, Bryson Corona, LPN [facility]   Primary Care Physician: Einar Pheasant, MD Location:  [facility] Note by: Gaspar Cola, MD Date: 03/21/2018; Time: 12:53 PM  Disclaimer:  Medicine is not an Chief Strategy Officer. The only guarantee in medicine is that nothing is guaranteed. It is important to note that the decision to proceed with this intervention was based on the information collected from the patient. The Data and conclusions were drawn from the patient's questionnaire, the interview, and the physical examination. Because the information was provided in large part by the patient, it cannot be guaranteed that it has not been purposely or unconsciously manipulated. Every effort has been made to obtain as much relevant data as possible for this evaluation. It is important to note that the conclusions that lead to this procedure are derived in large part from the available data. Always take into account that the treatment will also be dependent on availability of resources and existing treatment guidelines, considered by other Pain Management Practitioners as being common knowledge and practice, at the time of the intervention. For Medico-Legal purposes, it is also important to point out that variation in procedural techniques and pharmacological choices are the acceptable norm. The indications, contraindications, technique, and results of the above procedure should only be interpreted and judged by a Board-Certified Interventional Pain Specialist with extensive familiarity and expertise in the same exact procedure and technique.

## 2018-03-22 ENCOUNTER — Telehealth: Payer: Self-pay | Admitting: *Deleted

## 2018-03-22 NOTE — Telephone Encounter (Signed)
Voicemail left with patient to call if there are any questions or concerns re; procedure on yesterday.  Also, reminded him that we would like for him to contact primary care re; arrythmia from yesterday, PVC.

## 2018-03-22 NOTE — Telephone Encounter (Signed)
Please call pt and see how he is doing.  I received message that he had some PVCs noted in his procedure at pain clinic yesterday.  Let him know that I am out of the office.  I would like for him to come in and be evaluated just to confirm rhythm and to confirm no further w/up warranted.  .  Please schedule appt (for work in visit this week).  I sent message to both of you.  Not sure who in office.   Thanks

## 2018-03-22 NOTE — Telephone Encounter (Signed)
LMTCB Ok for [facility] to speak with patient

## 2018-03-27 ENCOUNTER — Telehealth: Payer: Self-pay | Admitting: Radiology

## 2018-03-27 ENCOUNTER — Other Ambulatory Visit: Payer: Self-pay | Admitting: Internal Medicine

## 2018-03-27 DIAGNOSIS — E538 Deficiency of other specified B group vitamins: Secondary | ICD-10-CM

## 2018-03-27 DIAGNOSIS — E7800 Pure hypercholesterolemia, unspecified: Secondary | ICD-10-CM

## 2018-03-27 DIAGNOSIS — E559 Vitamin D deficiency, unspecified: Secondary | ICD-10-CM

## 2018-03-27 DIAGNOSIS — R739 Hyperglycemia, unspecified: Secondary | ICD-10-CM

## 2018-03-27 DIAGNOSIS — I10 Essential (primary) hypertension: Secondary | ICD-10-CM

## 2018-03-27 NOTE — Telephone Encounter (Signed)
Order placed for f/u labs.  

## 2018-03-27 NOTE — Telephone Encounter (Signed)
Pt coming in for labs tomorrow, please place future orders. Thank you.  

## 2018-03-27 NOTE — Progress Notes (Signed)
Order placed for labs.

## 2018-03-28 ENCOUNTER — Other Ambulatory Visit (INDEPENDENT_AMBULATORY_CARE_PROVIDER_SITE_OTHER): Payer: Medicare Other

## 2018-03-28 ENCOUNTER — Other Ambulatory Visit: Payer: Self-pay | Admitting: Internal Medicine

## 2018-03-28 DIAGNOSIS — R739 Hyperglycemia, unspecified: Secondary | ICD-10-CM

## 2018-03-28 DIAGNOSIS — I10 Essential (primary) hypertension: Secondary | ICD-10-CM

## 2018-03-28 DIAGNOSIS — E7800 Pure hypercholesterolemia, unspecified: Secondary | ICD-10-CM

## 2018-03-28 DIAGNOSIS — E538 Deficiency of other specified B group vitamins: Secondary | ICD-10-CM

## 2018-03-28 DIAGNOSIS — E559 Vitamin D deficiency, unspecified: Secondary | ICD-10-CM | POA: Diagnosis not present

## 2018-03-28 DIAGNOSIS — R944 Abnormal results of kidney function studies: Secondary | ICD-10-CM

## 2018-03-28 LAB — VITAMIN D 25 HYDROXY (VIT D DEFICIENCY, FRACTURES): VITD: 21.46 ng/mL — ABNORMAL LOW (ref 30.00–100.00)

## 2018-03-28 LAB — LIPID PANEL
Cholesterol: 190 mg/dL (ref 0–200)
HDL: 58.4 mg/dL (ref >39.00)
LDL Cholesterol: 97 mg/dL (ref 0–99)
NonHDL: 132
Total CHOL/HDL Ratio: 3
Triglycerides: 174 mg/dL — ABNORMAL HIGH (ref 0.0–149.0)
VLDL: 34.8 mg/dL (ref 0.0–40.0)

## 2018-03-28 LAB — TSH: TSH: 9.58 u[IU]/mL — ABNORMAL HIGH (ref 0.35–4.50)

## 2018-03-28 LAB — HEPATIC FUNCTION PANEL
ALT: 29 U/L (ref 0–53)
AST: 28 U/L (ref 0–37)
Albumin: 4.3 g/dL (ref 3.5–5.2)
Alkaline Phosphatase: 54 U/L (ref 39–117)
Bilirubin, Direct: 0.1 mg/dL (ref 0.0–0.3)
Total Bilirubin: 0.8 mg/dL (ref 0.2–1.2)
Total Protein: 7.4 g/dL (ref 6.0–8.3)

## 2018-03-28 LAB — BASIC METABOLIC PANEL
BUN: 27 mg/dL — ABNORMAL HIGH (ref 6–23)
CO2: 31 mEq/L (ref 19–32)
Calcium: 10.1 mg/dL (ref 8.4–10.5)
Chloride: 101 mEq/L (ref 96–112)
Creatinine, Ser: 1.45 mg/dL (ref 0.40–1.50)
GFR: 49.67 mL/min — ABNORMAL LOW (ref >60.00)
Glucose, Bld: 94 mg/dL (ref 70–99)
Potassium: 3.9 mEq/L (ref 3.5–5.1)
Sodium: 139 mEq/L (ref 135–145)

## 2018-03-28 LAB — HEMOGLOBIN A1C: HEMOGLOBIN A1C: 5.7 % (ref 4.6–6.5)

## 2018-03-28 LAB — VITAMIN B12: Vitamin B-12: 198 pg/mL — ABNORMAL LOW (ref 211–911)

## 2018-03-28 NOTE — Progress Notes (Signed)
Order placed for f/u labs.  

## 2018-03-30 ENCOUNTER — Ambulatory Visit (INDEPENDENT_AMBULATORY_CARE_PROVIDER_SITE_OTHER): Payer: Medicare Other | Admitting: Internal Medicine

## 2018-03-30 ENCOUNTER — Encounter: Payer: Self-pay | Admitting: Internal Medicine

## 2018-03-30 VITALS — BP 130/78 | HR 58 | Temp 97.9°F | Resp 18 | Ht 68.0 in | Wt 244.5 lb

## 2018-03-30 DIAGNOSIS — M47816 Spondylosis without myelopathy or radiculopathy, lumbar region: Secondary | ICD-10-CM

## 2018-03-30 DIAGNOSIS — Z23 Encounter for immunization: Secondary | ICD-10-CM

## 2018-03-30 DIAGNOSIS — K2270 Barrett's esophagus without dysplasia: Secondary | ICD-10-CM

## 2018-03-30 DIAGNOSIS — Z0000 Encounter for general adult medical examination without abnormal findings: Secondary | ICD-10-CM | POA: Diagnosis not present

## 2018-03-30 DIAGNOSIS — I493 Ventricular premature depolarization: Secondary | ICD-10-CM

## 2018-03-30 DIAGNOSIS — E039 Hypothyroidism, unspecified: Secondary | ICD-10-CM

## 2018-03-30 DIAGNOSIS — E7800 Pure hypercholesterolemia, unspecified: Secondary | ICD-10-CM

## 2018-03-30 DIAGNOSIS — C439 Malignant melanoma of skin, unspecified: Secondary | ICD-10-CM

## 2018-03-30 DIAGNOSIS — Z8546 Personal history of malignant neoplasm of prostate: Secondary | ICD-10-CM

## 2018-03-30 DIAGNOSIS — Z6837 Body mass index (BMI) 37.0-37.9, adult: Secondary | ICD-10-CM | POA: Diagnosis not present

## 2018-03-30 DIAGNOSIS — E538 Deficiency of other specified B group vitamins: Secondary | ICD-10-CM | POA: Diagnosis not present

## 2018-03-30 DIAGNOSIS — I10 Essential (primary) hypertension: Secondary | ICD-10-CM

## 2018-03-30 DIAGNOSIS — Z860100 Personal history of colon polyps, unspecified: Secondary | ICD-10-CM

## 2018-03-30 DIAGNOSIS — E559 Vitamin D deficiency, unspecified: Secondary | ICD-10-CM

## 2018-03-30 MED ORDER — LEVOTHYROXINE SODIUM 100 MCG PO TABS: 100.0000 ug | ORAL_TABLET | Freq: Every day | ORAL | 3 refills | Status: DC

## 2018-03-30 MED ORDER — CYANOCOBALAMIN 1000 MCG/ML IJ SOLN
1000.0000 ug | Freq: Once | INTRAMUSCULAR | Status: AC
  Administered 2018-03-30: 1000 ug via INTRAMUSCULAR

## 2018-03-30 NOTE — Progress Notes (Signed)
[name], male   DOB: Nov 08, 1934, 83 y.o.   MRN: [id number]   Subjective:    [name], male    DOB: [birth year], 83 y.o.   MRN: [id number]  HPI  Patient here for his physical exam.  States he is doing relatively well.  Had a procedure at pain clinic 03/21/18.  Did well.  Pain is better.  Did have some PVC's noted on monitor.  Asymptomatic.  He reports he has not noticed any increased heart rate or palpitations.  Has seen cardiology previously.  He feels he is doing well from a cardiac standpoint and does not feel needs any further intervention.  Did agree to EKG today.  No chest pain.  Breathing stable.  No increased sob.  No acid reflux.  Bowels moving.  Discussed labs.  Discussed the need to start B12 injections and vitamin D.  Also discussed the need to adjust his synthroid dose.  Has f/u with pain clinic 04/18/18.  Overall pain is better.     Past Medical History:  Diagnosis Date  . Anemia   . Arthritis   . Barrett esophagus   . Cancer ([place])    prostate,skin  . Chicken pox   . Diverticulitis   . Dysrhythmia   . GERD (gastroesophageal reflux disease)   . Hyperlipidemia   . Hypertension   . Hypothyroidism   . Melanoma ([place])    Malignant resection  . Sleep apnea   . Ulcer    Past Surgical History:  Procedure Laterality Date  . CARDIAC CATHETERIZATION    . Cataract Surgery Right 02/13/14  . COLON SURGERY  2006-2008-2011   polyps removed  . COLONOSCOPY W/ POLYPECTOMY    . COLONOSCOPY WITH PROPOFOL N/A 02/15/2018   Procedure: COLONOSCOPY WITH PROPOFOL;  Surgeon: Manya Silvas, MD;  Location: [facility] ENDOSCOPY;  Service: Endoscopy;  Laterality: N/A;  . cystocopy  2003  . ESOPHAGOGASTRODUODENOSCOPY (EGD) WITH PROPOFOL N/A 08/04/2016   Procedure: ESOPHAGOGASTRODUODENOSCOPY (EGD) WITH PROPOFOL;  Surgeon: Manya Silvas, MD;  Location: [facility] ENDOSCOPY;  Service: Endoscopy;  Laterality: N/A;  . HEMORRHOID SURGERY    . PROSTATE SURGERY    . sleep study      Family History  Problem Relation Age of Onset  . Liver disease Mother   . Heart disease Father   . Kidney disease Father    Social History   Socioeconomic History  . Marital status: Married    Spouse name: Not on file  . Number of children: Not on file  . Years of education: Not on file  . Highest education level: Not on file  Occupational History  . Not on file  Social Needs  . Financial resource strain: Not on file  . Food insecurity:    Worry: Not on file    Inability: Not on file  . Transportation needs:    Medical: Not on file    Non-medical: Not on file  Tobacco Use  . Smoking status: Never Smoker  . Smokeless tobacco: Never Used  Substance and Sexual Activity  . Alcohol use: No    Alcohol/week: 0.0 standard drinks  . Drug use: No  . Sexual activity: Never  Lifestyle  . Physical activity:    Days per week: Not on file    Minutes per session: Not on file  . Stress: Not on file  Relationships  . Social connections:    Talks on phone: Not on file    Gets together:  Not on file    Attends religious service: Not on file    Active member of club or organization: Not on file    Attends meetings of clubs or organizations: Not on file    Relationship status: Not on file  Other Topics Concern  . Not on file  Social History Narrative  . Not on file    Outpatient Encounter Medications as of 03/30/2018  Medication Sig  . acetaminophen (TYLENOL) 500 MG tablet Take 500 mg by mouth every 6 (six) hours as needed.  Marland Kitchen amLODipine (NORVASC) 5 MG tablet Take 1 tablet (5 mg total) by mouth daily.  Marland Kitchen aspirin 81 MG tablet Take 81 mg by mouth daily.  . Calcium Carb-Cholecalciferol (CALCIUM PLUS D3 ABSORBABLE) [phone] MG-UNIT CAPS Take 1 capsule by mouth daily with breakfast. (Patient not taking: Reported on 03/21/2018)  . fluticasone (FLONASE) 50 MCG/ACT nasal spray TWO PUFFS IN EACH NOSTRIL ONCE A DAY  . hydrochlorothiazide (HYDRODIURIL) 25 MG tablet Take 1 tablet (25 mg  total) by mouth daily.  Marland Kitchen HYDROcodone-acetaminophen (NORCO/VICODIN) 5-325 MG tablet Take 1 tablet by mouth every 8 (eight) hours as needed for up to 7 days for severe pain.  . hyoscyamine (LEVSIN, ANASPAZ) 0.125 MG tablet Take 0.125 mg by mouth as needed.  . irbesartan (AVAPRO) 300 MG tablet Take 1 tablet (300 mg total) by mouth daily.  Marland Kitchen L-Lysine 500 MG TABS Take by mouth daily.  Marland Kitchen levothyroxine (SYNTHROID) 100 MCG tablet Take 1 tablet (100 mcg total) by mouth daily before breakfast.  . omeprazole (PRILOSEC) 20 MG capsule TAKE ONE CAPSULE BY MOUTH TWICE A DAY  . traMADol (ULTRAM) 50 MG tablet Take 1 tablet (50 mg total) by mouth every 8 (eight) hours as needed for severe pain.  . valACYclovir (VALTREX) 1000 MG tablet TAKE 1 TABLET BY MOUTH EVERY DAY  . [DISCONTINUED] levothyroxine (SYNTHROID, LEVOTHROID) 88 MCG tablet TAKE 1 TABLET BY MOUTH DAILY  . [DISCONTINUED] predniSONE (DELTASONE) 10 MG tablet Take 4 tablets by mouth on day 1 & 2, then 3 tablets on day 3&4, 2 tablets on day 5&6, 1 tablet on day 7&8. (Patient not taking: Reported on 02/15/2018)  . [DISCONTINUED] rosuvastatin (CRESTOR) 10 MG tablet Take 1 tablet (10 mg total) by mouth daily. (Patient not taking: Reported on 02/15/2018)  . [EXPIRED] cyanocobalamin ((VITAMIN B-12)) injection 1,000 mcg    No facility-administered encounter medications on file as of 03/30/2018.     Review of Systems  Constitutional: Negative for appetite change and unexpected weight change.  HENT: Negative for congestion and sinus pressure.   Eyes: Negative for pain and visual disturbance.  Respiratory: Negative for cough, chest tightness and shortness of breath.   Cardiovascular: Negative for chest pain, palpitations and leg swelling.  Gastrointestinal: Negative for abdominal pain, diarrhea, nausea and vomiting.  Genitourinary: Negative for difficulty urinating and dysuria.  Musculoskeletal: Positive for back pain. Negative for myalgias.  Skin: Negative for  color change and rash.  Neurological: Negative for dizziness, light-headedness and headaches.  Hematological: Negative for adenopathy. Does not bruise/bleed easily.  Psychiatric/Behavioral: Negative for agitation and dysphoric mood.       Objective:    Physical Exam  Constitutional: He is oriented to person, place, and time. He appears well-developed and well-nourished. No distress.  HENT:  Head: Normocephalic and atraumatic.  Nose: Nose normal.  Mouth/Throat: Oropharynx is clear and moist. No oropharyngeal exudate.  Eyes: Conjunctivae are normal. Right eye exhibits no discharge. Left eye exhibits no discharge.  Neck: Neck  supple. No thyromegaly present.  Cardiovascular: Normal rate and regular rhythm.  Pulmonary/Chest: Breath sounds normal. No respiratory distress. He has no wheezes.  Abdominal: Soft. Bowel sounds are normal. There is no tenderness.  Genitourinary:  Genitourinary Comments: Not performed.   Musculoskeletal: He exhibits no edema or tenderness.  Lymphadenopathy:    He has no cervical adenopathy.  Neurological: He is alert and oriented to person, place, and time.  Skin: No rash noted. No erythema.  Psychiatric: He has a normal mood and affect. His behavior is normal.    BP 130/78 (BP Location: Left Arm, Patient Position: Sitting, Cuff Size: Large)   Pulse (!) 58   Temp 97.9 F (36.6 C) (Oral)   Resp 18   Ht 5\' 8"  (1.727 m)   Wt 244 lb 8 oz (110.9 kg)   SpO2 97%   BMI 37.18 kg/m  Wt Readings from Last 3 Encounters:  03/30/18 244 lb 8 oz (110.9 kg)  03/21/18 245 lb (111.1 kg)  02/15/18 245 lb (111.1 kg)     Lab Results  Component Value Date   WBC 6.8 11/14/2017   HGB 15.3 11/14/2017   HCT 45.3 11/14/2017   PLT 226.0 11/14/2017   GLUCOSE 94 03/28/2018   CHOL 190 03/28/2018   TRIG 174.0 (H) 03/28/2018   HDL 58.40 03/28/2018   LDLDIRECT 111.0 02/24/2017   LDLCALC 97 03/28/2018   ALT 29 03/28/2018   AST 28 03/28/2018   NA 139 03/28/2018   K 3.9  03/28/2018   CL 101 03/28/2018   CREATININE 1.45 03/28/2018   BUN 27 (H) 03/28/2018   CO2 31 03/28/2018   TSH 9.58 (H) 03/28/2018   PSA <0.01 Repeated and verified X2. (L) 07/13/2017   HGBA1C 5.7 03/28/2018    Dg C-arm 1-60 Min-no Report  Result Date: 03/21/2018 Fluoroscopy was utilized by the requesting physician.  No radiographic interpretation.       Assessment & Plan:   Problem List Items Addressed This Visit    Asymptomatic PVCs    Pt noted to have PVCs during recent procedure. Asymptomatic.  He states he is doing better.  Feels better. No chest pain.  No sob.  No increased heart rate or palpitations.  EKG - SR/SB with no acute ischemic changes.  No PVCs noted on exam today.  Discussed f/u with cardiology.  He declines.  Wants to follow.        B12 deficiency    Recent low B12.  Start B12 injections as directed.  Follow.        Relevant Medications   cyanocobalamin ((VITAMIN B-12)) injection 1,000 mcg (Completed)   Barrett's esophagus    Symptoms controlled.  Last EGD 08/2016.  Followed by GI.        BMI 37.0-37.9, adult    Discussed diet and exercise.  Follow.       History of colonic polyps    Colonoscopy 01/2018 - multiple polyps (tubular adenomas) - 22 removed.  Continue f/u with GI.        History of prostate cancer    S/p prostatectomy.  PSA <.01 07/2017.        Hypercholesterolemia    On crestor.  Low cholesterol  diet and exercise.  Hold on increasing dose after discussion.  Follow lipid panel and liver function tests.        Hypertension    Blood pressure under good control.  Continue same medication regimen.  Follow pressures.  Follow metabolic panel.  Hypothyroid    On synthroid.  TSH elevated.  Discussed proper way to take medication.  Increase synthroid to 179mcg q day.  Follow tsh.        Relevant Medications   levothyroxine (SYNTHROID) 100 MCG tablet   Other Relevant Orders   TSH   Malignant melanoma ([place])    Followed by  dermatology.       Osteoarthritis of lumbar spine (Chronic)    Followed by pain clinic.  Doing better after last procedure.  Follow.        Vitamin D deficiency    Follow vitamin D level.  Recent level low.  Start vitamin D3 1000 units per day.        Other Visit Diagnoses    PVC (premature ventricular contraction)    -  Primary   Relevant Orders   EKG 12-Lead (Completed)   Need for influenza vaccination       Relevant Orders   Flu vaccine HIGH DOSE PF (Fluzone High dose) (Completed)   Routine general medical examination at a health care facility           Einar Pheasant, MD

## 2018-03-30 NOTE — Patient Instructions (Addendum)
Start vitamin D3 1000 units per day.  Increase synthroid to 170mcg per day.  New prescription sent in to pharmacy.

## 2018-04-02 ENCOUNTER — Encounter: Payer: Self-pay | Admitting: Internal Medicine

## 2018-04-02 DIAGNOSIS — E538 Deficiency of other specified B group vitamins: Secondary | ICD-10-CM | POA: Insufficient documentation

## 2018-04-02 DIAGNOSIS — E039 Hypothyroidism, unspecified: Secondary | ICD-10-CM | POA: Insufficient documentation

## 2018-04-02 NOTE — Assessment & Plan Note (Signed)
Pt noted to have PVCs during recent procedure. Asymptomatic.  He states he is doing better.  Feels better. No chest pain.  No sob.  No increased heart rate or palpitations.  EKG - SR/SB with no acute ischemic changes.  No PVCs noted on exam today.  Discussed f/u with cardiology.  He declines.  Wants to follow.

## 2018-04-02 NOTE — Assessment & Plan Note (Signed)
Discussed diet and exercise.  Follow.  

## 2018-04-02 NOTE — Assessment & Plan Note (Signed)
Followed by pain clinic.  Doing better after last procedure.  Follow.

## 2018-04-02 NOTE — Assessment & Plan Note (Signed)
Colonoscopy 01/2018 - multiple polyps (tubular adenomas) - 22 removed.  Continue f/u with GI.

## 2018-04-02 NOTE — Assessment & Plan Note (Signed)
On crestor.  Low cholesterol  diet and exercise.  Hold on increasing dose after discussion.  Follow lipid panel and liver function tests.

## 2018-04-02 NOTE — Assessment & Plan Note (Signed)
Followed by dermatology

## 2018-04-02 NOTE — Assessment & Plan Note (Signed)
Symptoms controlled.  Last EGD 08/2016.  Followed by GI.

## 2018-04-02 NOTE — Assessment & Plan Note (Signed)
S/p prostatectomy.  PSA <.01 07/2017.

## 2018-04-02 NOTE — Assessment & Plan Note (Signed)
On synthroid.  TSH elevated.  Discussed proper way to take medication.  Increase synthroid to 155mcg q day.  Follow tsh.

## 2018-04-02 NOTE — Assessment & Plan Note (Signed)
Blood pressure under good control.  Continue same medication regimen.  Follow pressures.  Follow metabolic panel.   

## 2018-04-02 NOTE — Assessment & Plan Note (Signed)
Recent low B12.  Start B12 injections as directed.  Follow.

## 2018-04-02 NOTE — Assessment & Plan Note (Addendum)
Follow vitamin D level.  Recent level low.  Start vitamin D3 1000 units per day.

## 2018-04-06 ENCOUNTER — Ambulatory Visit (INDEPENDENT_AMBULATORY_CARE_PROVIDER_SITE_OTHER): Payer: Medicare Other

## 2018-04-06 DIAGNOSIS — E538 Deficiency of other specified B group vitamins: Secondary | ICD-10-CM

## 2018-04-06 MED ORDER — CYANOCOBALAMIN 1000 MCG/ML IJ SOLN
1000.0000 ug | Freq: Once | INTRAMUSCULAR | Status: AC
  Administered 2018-04-06: 1000 ug via INTRAMUSCULAR

## 2018-04-06 NOTE — Progress Notes (Signed)
Patient comes in for B 12 injection.  Injected right deltoid.  Patient tolerated injection well.   Reviewed.  Dr Scott 

## 2018-04-13 ENCOUNTER — Ambulatory Visit (INDEPENDENT_AMBULATORY_CARE_PROVIDER_SITE_OTHER): Payer: Medicare Other

## 2018-04-13 DIAGNOSIS — E538 Deficiency of other specified B group vitamins: Secondary | ICD-10-CM

## 2018-04-13 MED ORDER — CYANOCOBALAMIN 1000 MCG/ML IJ SOLN
1000.0000 ug | Freq: Once | INTRAMUSCULAR | Status: AC
  Administered 2018-04-13: 1000 ug via INTRAMUSCULAR

## 2018-04-13 NOTE — Progress Notes (Addendum)
Patient comes in for B 12 injection.  Injected left deltoid.  Patient tolerated injection well.   Reviewed.  Dr Scott 

## 2018-04-18 ENCOUNTER — Encounter: Payer: Self-pay | Admitting: Pain Medicine

## 2018-04-18 ENCOUNTER — Ambulatory Visit (HOSPITAL_BASED_OUTPATIENT_CLINIC_OR_DEPARTMENT_OTHER): Payer: Medicare Other | Admitting: Pain Medicine

## 2018-04-18 ENCOUNTER — Ambulatory Visit
Admission: RE | Admit: 2018-04-18 | Discharge: 2018-04-18 | Disposition: A | Discharge status: Home / Self Care | Payer: Medicare Other | Source: Ambulatory Visit | Attending: Pain Medicine | Admitting: Pain Medicine

## 2018-04-18 ENCOUNTER — Other Ambulatory Visit: Payer: Self-pay

## 2018-04-18 VITALS — BP 145/78 | HR 58 | Temp 98.1°F | Resp 15 | Ht 68.0 in | Wt 243.0 lb

## 2018-04-18 DIAGNOSIS — Z888 Allergy status to other drugs, medicaments and biological substances status: Secondary | ICD-10-CM | POA: Diagnosis not present

## 2018-04-18 DIAGNOSIS — M5442 Lumbago with sciatica, left side: Secondary | ICD-10-CM

## 2018-04-18 DIAGNOSIS — M47896 Other spondylosis, lumbar region: Secondary | ICD-10-CM | POA: Insufficient documentation

## 2018-04-18 DIAGNOSIS — Z79899 Other long term (current) drug therapy: Secondary | ICD-10-CM | POA: Diagnosis not present

## 2018-04-18 DIAGNOSIS — G8929 Other chronic pain: Secondary | ICD-10-CM | POA: Insufficient documentation

## 2018-04-18 DIAGNOSIS — M47817 Spondylosis without myelopathy or radiculopathy, lumbosacral region: Secondary | ICD-10-CM | POA: Insufficient documentation

## 2018-04-18 DIAGNOSIS — M47816 Spondylosis without myelopathy or radiculopathy, lumbar region: Secondary | ICD-10-CM | POA: Insufficient documentation

## 2018-04-18 DIAGNOSIS — M4310 Spondylolisthesis, site unspecified: Secondary | ICD-10-CM | POA: Insufficient documentation

## 2018-04-18 DIAGNOSIS — M47897 Other spondylosis, lumbosacral region: Secondary | ICD-10-CM | POA: Diagnosis not present

## 2018-04-18 DIAGNOSIS — M4300 Spondylolysis, site unspecified: Secondary | ICD-10-CM

## 2018-04-18 DIAGNOSIS — M961 Postlaminectomy syndrome, not elsewhere classified: Secondary | ICD-10-CM | POA: Insufficient documentation

## 2018-04-18 DIAGNOSIS — G8918 Other acute postprocedural pain: Secondary | ICD-10-CM

## 2018-04-18 DIAGNOSIS — Z7982 Long term (current) use of aspirin: Secondary | ICD-10-CM | POA: Insufficient documentation

## 2018-04-18 DIAGNOSIS — Z79890 Hormone replacement therapy (postmenopausal): Secondary | ICD-10-CM | POA: Diagnosis not present

## 2018-04-18 MED ORDER — FENTANYL CITRATE (PF) 100 MCG/2ML IJ SOLN
25.0000 ug | INTRAMUSCULAR | Status: DC | PRN
  Administered 2018-04-18: 100 ug via INTRAVENOUS
  Filled 2018-04-18: qty 2

## 2018-04-18 MED ORDER — ROPIVACAINE HCL 2 MG/ML IJ SOLN
9.0000 mL | Freq: Once | INTRAMUSCULAR | Status: AC
  Administered 2018-04-18: 10 mL via PERINEURAL
  Filled 2018-04-18: qty 10

## 2018-04-18 MED ORDER — TRIAMCINOLONE ACETONIDE 40 MG/ML IJ SUSP
40.0000 mg | Freq: Once | INTRAMUSCULAR | Status: AC
  Administered 2018-04-18: 40 mg
  Filled 2018-04-18: qty 1

## 2018-04-18 MED ORDER — LACTATED RINGERS IV SOLN
1000.0000 mL | Freq: Once | INTRAVENOUS | Status: AC
  Administered 2018-04-18: 1000 mL via INTRAVENOUS

## 2018-04-18 MED ORDER — HYDROCODONE-ACETAMINOPHEN 5-325 MG PO TABS: 1.0000 | ORAL_TABLET | Freq: Three times a day (TID) | ORAL | 0 refills | Status: AC | PRN

## 2018-04-18 MED ORDER — MIDAZOLAM HCL 5 MG/5ML IJ SOLN
1.0000 mg | INTRAMUSCULAR | Status: DC | PRN
  Administered 2018-04-18: 3 mg via INTRAVENOUS
  Filled 2018-04-18: qty 5

## 2018-04-18 MED ORDER — LIDOCAINE HCL 2 % IJ SOLN
20.0000 mL | Freq: Once | INTRAMUSCULAR | Status: AC
  Administered 2018-04-18: 400 mg
  Filled 2018-04-18: qty 20

## 2018-04-18 NOTE — Progress Notes (Signed)
Patient's Name: Hector [name]  MRN: [id number]  Referring Provider: Milinda Pointer, MD  DOB: [birth year]  PCP: Einar Pheasant, MD  DOS: 04/18/2018  Note by: Gaspar Cola, MD  Service setting: Ambulatory outpatient  Specialty: Interventional Pain Management  Patient type: Established  Location: [facility] (AMB) Pain Management Facility  Visit type: Interventional Procedure   Primary Reason for Visit: Interventional Pain Management Treatment. CC: Back Pain (low and tailbone area)  Procedure:          Anesthesia, Analgesia, Anxiolysis:  Type: Thermal Lumbar Facet, Medial Branch Radiofrequency Ablation/Neurotomy  #1  Level: L2, L3, L4, L5, & S1 Medial Branch Level(s). These levels will denervate the L3-4, L4-5, and the L5-S1 lumbar facet joints. Primary Purpose: Therapeutic Region: Posterolateral Lumbosacral Spine Laterality: Right  Type: Moderate (Conscious) Sedation combined with Local Anesthesia Indication(s): Analgesia and Anxiety Route: Intravenous (IV) IV Access: Secured Sedation: Meaningful verbal contact was maintained at all times during the procedure  Local Anesthetic: Lidocaine 1-2%   Indications: 1. Spondylosis without myelopathy or radiculopathy, lumbosacral region   2. Lumbar facet arthropathy (Bilateral)   3. Lumbar facet osteoarthritis (Bilateral)   4. Lumbar facet syndrome (Bilateral) (L>R)   5. Lumbosacral Grade 1 Anterolisthesis of L5 over S1   6. Chronic low back pain (Primary Area of Pain) (Bilateral) (L>R)    Mr. Chelf has been dealing with the above chronic pain for longer than three months and has either failed to respond, was unable to tolerate, or simply did not get enough benefit from other more conservative therapies including, but not limited to: 1. Over-the-counter medications 2. Anti-inflammatory medications 3. Muscle relaxants 4. Membrane stabilizers 5. Opioids 6. Physical therapy and/or chiropractic manipulation 7. Modalities (Heat, ice, etc.) 8.  Invasive techniques such as nerve blocks. Mr. Walth has attained more than 50% relief of the pain from a series of diagnostic injections conducted in separate occasions.  Pain Score: Pre-procedure: 1 /10 Post-procedure: 1 /10  Evaluation of last interventional procedure  03/21/2018 Procedure: Therapeutic left-sided lumbar facet RFA #1 under fluoroscopic guidance and IV sedation Pre-procedure pain score:  3/10 Post-procedure pain score: 0/10 (100% relief) Influential Factors: Intra-procedural challenges: None observed.         Reported side-effects: None.        Post-procedural adverse reactions or complications: None reported         Sedation: Sedation provided. When no sedatives are used, the analgesic levels obtained are directly associated to the effectiveness of the local anesthetics. However, when sedation is provided, the level of analgesia obtained during the initial 1 hour following the intervention, is believed to be the result of a combination of factors. These factors may include, but are not limited to: 1. The effectiveness of the local anesthetics used. 2. The effects of the analgesic(s) and/or anxiolytic(s) used. 3. The degree of discomfort experienced by the patient at the time of the procedure. 4. The patients ability and reliability in recalling and recording the events. 5. The presence and influence of possible secondary gains and/or psychosocial factors. Reported result: Relief experienced during the 1st hour after the procedure: 75 % (Ultra-Short Term Relief) Mr. Welles has indicated area to have been numb during this time. Interpretative annotation: Clinically appropriate result. Analgesia during this period is likely to be Local Anesthetic and/or IV Sedative (Analgesic/Anxiolytic) related.          Effects of local anesthetic: The analgesic effects attained during this period are directly associated to the localized infiltration of local anesthetics and  therefore cary  significant diagnostic value as to the etiological location, or anatomical origin, of the pain. Expected duration of relief is directly dependent on the pharmacodynamics of the local anesthetic used. Long-acting (4-6 hours) anesthetics used.  Reported result: Relief during the next 4 to 6 hour after the procedure: 75 % (Short-Term Relief) Mr. Coe has indicated area to have been numb during this time. Interpretative annotation: Clinically appropriate result. Analgesia during this period is likely to be Local Anesthetic-related.          Long-term benefit: Defined as the period of time past the expected duration of local anesthetics (1 hour for short-acting and 4-6 hours for long-acting). With the possible exception of prolonged sympathetic blockade from the local anesthetics, benefits during this period are typically attributed to, or associated with, other factors such as analgesic sensory neuropraxia, antiinflammatory effects, or beneficial biochemical changes provided by agents other than the local anesthetics.  Reported result: Extended relief following procedure: 80 % (Long-Term Relief)            Interpretative annotation: Clinically appropriate result. Good relief. Therapeutic success. Inflammation plays a part in the etiology to the pain. Benefit could signal adequate RF ablation.  Pre-op Assessment:  Mr. Stockinger is a 83 y.o. (year old), male patient, seen today for interventional treatment. He  has a past surgical history that includes Prostate surgery; cystocopy (2003); Hemorrhoid surgery; Cardiac catheterization; sleep study; Colon surgery (2006-2008-2011); Cataract Surgery (Right, 02/13/14); Esophagogastroduodenoscopy (egd) with propofol (N/A, 08/04/2016); Colonoscopy w/ polypectomy; and Colonoscopy with propofol (N/A, 02/15/2018). Mr. Cristobal has a current medication list which includes the following prescription(s): acetaminophen, amlodipine, aspirin, calcium plus d3 absorbable, fluticasone,  hydrochlorothiazide, hyoscyamine, irbesartan, l-lysine, levothyroxine, omeprazole, tramadol, valacyclovir, hydrocodone-acetaminophen, and hydrocodone-acetaminophen, and the following Facility-Administered Medications: fentanyl and midazolam. His primarily concern today is the Back Pain (low and tailbone area)  Initial Vital Signs:  Pulse/HCG Rate: (!) 58ECG Heart Rate: 68 Temp: 98.2 F (36.8 C) Resp: 18 BP: (!) 163/81 SpO2: 99 %  BMI: Estimated body mass index is 36.95 kg/m as calculated from the following:   Height as of this encounter: 5\' 8"  (1.727 m).   Weight as of this encounter: 243 lb (110.2 kg).  Risk Assessment: Allergies: Reviewed. He is allergic to tape.  Allergy Precautions: None required Coagulopathies: Reviewed. None identified.  Blood-thinner therapy: None at this time Active Infection(s): Reviewed. None identified. Mr. Millea is afebrile  Site Confirmation: Mr. Hinch was asked to confirm the procedure and laterality before marking the site Procedure checklist: Completed Consent: Before the procedure and under the influence of no sedative(s), amnesic(s), or anxiolytics, the patient was informed of the treatment options, risks and possible complications. To fulfill our ethical and legal obligations, as recommended by the American Medical Association's Code of Ethics, I have informed the patient of my clinical impression; the nature and purpose of the treatment or procedure; the risks, benefits, and possible complications of the intervention; the alternatives, including doing nothing; the risk(s) and benefit(s) of the alternative treatment(s) or procedure(s); and the risk(s) and benefit(s) of doing nothing. The patient was provided information about the general risks and possible complications associated with the procedure. These may include, but are not limited to: failure to achieve desired goals, infection, bleeding, organ or nerve damage, allergic reactions, paralysis, and  death. In addition, the patient was informed of those risks and complications associated to Spine-related procedures, such as failure to decrease pain; infection (i.e.: Meningitis, epidural or intraspinal abscess); bleeding (i.e.: epidural hematoma,  subarachnoid hemorrhage, or any other type of intraspinal or peri-dural bleeding); organ or nerve damage (i.e.: Any type of peripheral nerve, nerve root, or spinal cord injury) with subsequent damage to sensory, motor, and/or autonomic systems, resulting in permanent pain, numbness, and/or weakness of one or several areas of the body; allergic reactions; (i.e.: anaphylactic reaction); and/or death. Furthermore, the patient was informed of those risks and complications associated with the medications. These include, but are not limited to: allergic reactions (i.e.: anaphylactic or anaphylactoid reaction(s)); adrenal axis suppression; blood sugar elevation that in diabetics may result in ketoacidosis or comma; water retention that in patients with history of congestive heart failure may result in shortness of breath, pulmonary edema, and decompensation with resultant heart failure; weight gain; swelling or edema; medication-induced neural toxicity; particulate matter embolism and blood vessel occlusion with resultant organ, and/or nervous system infarction; and/or aseptic necrosis of one or more joints. Finally, the patient was informed that Medicine is not an exact science; therefore, there is also the possibility of unforeseen or unpredictable risks and/or possible complications that may result in a catastrophic outcome. The patient indicated having understood very clearly. We have given the patient no guarantees and we have made no promises. Enough time was given to the patient to ask questions, all of which were answered to the patient's satisfaction. Mr. Greenley has indicated that he wanted to continue with the procedure. Attestation: I, the ordering provider, attest  that I have discussed with the patient the benefits, risks, side-effects, alternatives, likelihood of achieving goals, and potential problems during recovery for the procedure that I have provided informed consent. Date  Time: 04/18/2018 10:31 AM  Pre-Procedure Preparation:  Monitoring: As per clinic protocol. Respiration, ETCO2, SpO2, BP, heart rate and rhythm monitor placed and checked for adequate function Safety Precautions: Patient was assessed for positional comfort and pressure points before starting the procedure. Time-out: I initiated and conducted the "Time-out" before starting the procedure, as per protocol. The patient was asked to participate by confirming the accuracy of the "Time Out" information. Verification of the correct person, site, and procedure were performed and confirmed by me, the nursing staff, and the patient. "Time-out" conducted as per Joint Commission's Universal Protocol (UP.01.01.01). Time: 1109  Description of Procedure:          Position: Prone Laterality: Right Levels:  L2, L3, L4, L5, & S1 Medial Branch Level(s), at the L3-4, L4-5, and the L5-S1 lumbar facet joints. Area Prepped: Lumbosacral Prepping solution: ChloraPrep (2% chlorhexidine gluconate and 70% isopropyl alcohol) Safety Precautions: Aspiration looking for blood return was conducted prior to all injections. At no point did we inject any substances, as a needle was being advanced. Before injecting, the patient was told to immediately notify me if he was experiencing any new onset of "ringing in the ears, or metallic taste in the mouth". No attempts were made at seeking any paresthesias. Safe injection practices and needle disposal techniques used. Medications properly checked for expiration dates. SDV (single dose vial) medications used. After the completion of the procedure, all disposable equipment used was discarded in the proper designated medical waste containers. Local Anesthesia: Protocol  guidelines were followed. The patient was positioned over the fluoroscopy table. The area was prepped in the usual manner. The time-out was completed. The target area was identified using fluoroscopy. A 12-in long, straight, sterile hemostat was used with fluoroscopic guidance to locate the targets for each level blocked. Once located, the skin was marked with an approved surgical skin marker.  Once all sites were marked, the skin (epidermis, dermis, and hypodermis), as well as deeper tissues (fat, connective tissue and muscle) were infiltrated with a small amount of a short-acting local anesthetic, loaded on a 10cc syringe with a 25G, 1.5-in  Needle. An appropriate amount of time was allowed for local anesthetics to take effect before proceeding to the next step. Local Anesthetic: Lidocaine 2.0% The unused portion of the local anesthetic was discarded in the proper designated containers. Technical explanation of process:  Radiofrequency Ablation (RFA) L2 Medial Branch Nerve RFA: The target area for the L2 medial branch is at the junction of the postero-lateral aspect of the superior articular process and the superior, posterior, and medial edge of the transverse process of L3. Under fluoroscopic guidance, a Radiofrequency needle was inserted until contact was made with os over the superior postero-lateral aspect of the pedicular shadow (target area). Sensory and motor testing was conducted to properly adjust the position of the needle. Once satisfactory placement of the needle was achieved, the numbing solution was slowly injected after negative aspiration for blood. 2.0 mL of the nerve block solution was injected without difficulty or complication. After waiting for at least 3 minutes, the ablation was performed. Once completed, the needle was removed intact. L3 Medial Branch Nerve RFA: The target area for the L3 medial branch is at the junction of the postero-lateral aspect of the superior articular process  and the superior, posterior, and medial edge of the transverse process of L4. Under fluoroscopic guidance, a Radiofrequency needle was inserted until contact was made with os over the superior postero-lateral aspect of the pedicular shadow (target area). Sensory and motor testing was conducted to properly adjust the position of the needle. Once satisfactory placement of the needle was achieved, the numbing solution was slowly injected after negative aspiration for blood. 2.0 mL of the nerve block solution was injected without difficulty or complication. After waiting for at least 3 minutes, the ablation was performed. Once completed, the needle was removed intact. L4 Medial Branch Nerve RFA: The target area for the L4 medial branch is at the junction of the postero-lateral aspect of the superior articular process and the superior, posterior, and medial edge of the transverse process of L5. Under fluoroscopic guidance, a Radiofrequency needle was inserted until contact was made with os over the superior postero-lateral aspect of the pedicular shadow (target area). Sensory and motor testing was conducted to properly adjust the position of the needle. Once satisfactory placement of the needle was achieved, the numbing solution was slowly injected after negative aspiration for blood. 2.0 mL of the nerve block solution was injected without difficulty or complication. After waiting for at least 3 minutes, the ablation was performed. Once completed, the needle was removed intact. L5 Medial Branch Nerve RFA: The target area for the L5 medial branch is at the junction of the postero-lateral aspect of the superior articular process of S1 and the superior, posterior, and medial edge of the sacral ala. Under fluoroscopic guidance, a Radiofrequency needle was inserted until contact was made with os over the superior postero-lateral aspect of the pedicular shadow (target area). Sensory and motor testing was conducted to  properly adjust the position of the needle. Once satisfactory placement of the needle was achieved, the numbing solution was slowly injected after negative aspiration for blood. 2.0 mL of the nerve block solution was injected without difficulty or complication. After waiting for at least 3 minutes, the ablation was performed. Once completed, the needle was  removed intact. S1 Medial Branch Nerve RFA: The target area for the S1 medial branch is located inferior to the junction of the S1 superior articular process and the L5 inferior articular process, posterior, inferior, and lateral to the 6 o'clock position of the L5-S1 facet joint, just superior to the S1 posterior foramen. Under fluoroscopic guidance, the Radiofrequency needle was advanced until contact was made with os over the Target area. Sensory and motor testing was conducted to properly adjust the position of the needle. Once satisfactory placement of the needle was achieved, the numbing solution was slowly injected after negative aspiration for blood. 2.0 mL of the nerve block solution was injected without difficulty or complication. After waiting for at least 3 minutes, the ablation was performed. Once completed, the needle was removed intact. Radiofrequency lesioning (ablation):  Radiofrequency Generator: NeuroTherm [id number] Sensory Stimulation Parameters: 50 Hz was used to locate & identify the nerve, making sure that the needle was positioned such that there was no sensory stimulation below 0.3 V or above 0.7 V. Motor Stimulation Parameters: 2 Hz was used to evaluate the motor component. Care was taken not to lesion any nerves that demonstrated motor stimulation of the lower extremities at an output of less than 2.5 times that of the sensory threshold, or a maximum of 2.0 V. Lesioning Technique Parameters: Standard Radiofrequency settings. (Not bipolar or pulsed.) Temperature Settings: 80 degrees C Lesioning time: 60 seconds Intra-operative  Compliance: Compliant Materials & Medications: Needle(s) (Electrode/Cannula) Type: Teflon-coated, curved tip, Radiofrequency needle(s) Gauge: 22G Length: 10cm Numbing solution: 0.2% PF-Ropivacaine + Triamcinolone (40 mg/mL) diluted to a final concentration of 4 mg of Triamcinolone/mL of Ropivacaine The unused portion of the solution was discarded in the proper designated containers.  Once the entire procedure was completed, the treated area was cleaned, making sure to leave some of the prepping solution back to take advantage of its long term bactericidal properties.  Illustration of the posterior view of the lumbar spine and the posterior neural structures. Laminae of L2 through S1 are labeled. DPRL5, dorsal primary ramus of L5; DPRS1, dorsal primary ramus of S1; DPR3, dorsal primary ramus of L3; FJ, facet (zygapophyseal) joint L3-L4; I, inferior articular process of L4; LB1, lateral branch of dorsal primary ramus of L1; IAB, inferior articular branches from L3 medial branch (supplies L4-L5 facet joint); IBP, intermediate branch plexus; MB3, medial branch of dorsal primary ramus of L3; NR3, third lumbar nerve root; S, superior articular process of L5; SAB, superior articular branches from L4 (supplies L4-5 facet joint also); TP3, transverse process of L3.  Vitals:   04/18/18 1206 04/18/18 1215 04/18/18 1225 04/18/18 1235  BP: (!) 161/95 (!) 106/94 (!) 149/72 (!) 145/78  Pulse:      Resp: 16 16 15 15   Temp:  98.8 F (37.1 C)  98.1 F (36.7 C)  TempSrc:  Temporal  Temporal  SpO2: 94% 98% 99% 100%  Weight:      Height:        Start Time: 1109 hrs. End Time: 1204 hrs.  Imaging Guidance (Spinal):          Type of Imaging Technique: Fluoroscopy Guidance (Spinal) Indication(s): Assistance in needle guidance and placement for procedures requiring needle placement in or near specific anatomical locations not easily accessible without such assistance. Exposure Time: Please see nurses  notes. Contrast: None used. Fluoroscopic Guidance: I was personally present during the use of fluoroscopy. "Tunnel Vision Technique" used to obtain the best possible view of the target area. Parallax  error corrected before commencing the procedure. "Direction-depth-direction" technique used to introduce the needle under continuous pulsed fluoroscopy. Once target was reached, antero-posterior, oblique, and lateral fluoroscopic projection used confirm needle placement in all planes. Images permanently stored in EMR. Interpretation: No contrast injected. I personally interpreted the imaging intraoperatively. Adequate needle placement confirmed in multiple planes. Permanent images saved into the patient's record.  Antibiotic Prophylaxis:   Anti-infectives (From admission, onward)   None     Indication(s): None identified  Post-operative Assessment:  Post-procedure Vital Signs:  Pulse/HCG Rate: (!) 58(!) 57 Temp: 98.1 F (36.7 C) Resp: 15 BP: (!) 145/78 SpO2: 100 %  EBL: None  Complications: No immediate post-treatment complications observed by team, or reported by patient.  Note: The patient tolerated the entire procedure well. A repeat set of vitals were taken after the procedure and the patient was kept under observation following institutional policy, for this type of procedure. Post-procedural neurological assessment was performed, showing return to baseline, prior to discharge. The patient was provided with post-procedure discharge instructions, including a section on how to identify potential problems. Should any problems arise concerning this procedure, the patient was given instructions to immediately contact us, at any time, without hesitation. In any case, we plan to contact the patient by telephone for a follow-up status report regarding this interventional procedure.  Comments:  No additional relevant information.  Plan of Care    Imaging Orders     DG C-Arm 1-60 Min-No  Report  Procedure Orders     Radiofrequency,Lumbar  Medications ordered for procedure: Meds ordered this encounter  Medications  . lidocaine (XYLOCAINE) 2 % (with pres) injection 400 mg  . midazolam (VERSED) 5 MG/5ML injection 1-2 mg    Make sure Flumazenil is available in the pyxis when using this medication. If oversedation occurs, administer 0.2 mg IV over 15 sec. If after 45 sec no response, administer 0.2 mg again over 1 min; may repeat at 1 min intervals; not to exceed 4 doses (1 mg)  . fentaNYL (SUBLIMAZE) injection 25-50 mcg    Make sure Narcan is available in the pyxis when using this medication. In the event of respiratory depression (RR< 8/min): Titrate NARCAN (naloxone) in increments of 0.1 to 0.2 mg IV at 2-3 minute intervals, until desired degree of reversal.  . lactated ringers infusion 1,000 mL  . ropivacaine (PF) 2 mg/mL (0.2%) (NAROPIN) injection 9 mL  . triamcinolone acetonide (KENALOG-40) injection 40 mg  . HYDROcodone-acetaminophen (NORCO/VICODIN) 5-325 MG tablet    Sig: Take 1 tablet by mouth every 8 (eight) hours as needed for up to 7 days for severe pain.    Dispense:  21 tablet    Refill:  0    For acute post-operative pain. Not to be refilled. To last 7 days.  Marland Kitchen HYDROcodone-acetaminophen (NORCO/VICODIN) 5-325 MG tablet    Sig: Take 1 tablet by mouth every 8 (eight) hours as needed for up to 7 days for severe pain.    Dispense:  21 tablet    Refill:  0    For acute post-operative pain. Not to be refilled. To last 7 days.   Medications administered: We administered lidocaine, midazolam, fentaNYL, lactated ringers, ropivacaine (PF) 2 mg/mL (0.2%), and triamcinolone acetonide.  See the medical record for exact dosing, route, and time of administration.  New Prescriptions   HYDROCODONE-ACETAMINOPHEN (NORCO/VICODIN) 5-325 MG TABLET    Take 1 tablet by mouth every 8 (eight) hours as needed for up to 7 days for severe pain.  HYDROCODONE-ACETAMINOPHEN  (NORCO/VICODIN) 5-325 MG TABLET    Take 1 tablet by mouth every 8 (eight) hours as needed for up to 7 days for severe pain.   Disposition: Discharge home  Discharge Date & Time: 04/18/2018; 1239 hrs.   Physician-requested Follow-up: Return for Post-RFA eval (6 wks), w/ Dionisio David, NP.  Future Appointments  Date Time Provider [place]  04/20/2018  9:30 AM [facility] NURSE [facility]  05/15/2018  9:00 AM Vevelyn Francois, NP [facility] None  05/25/2018  9:00 AM O'Brien-Blaney, Denisa L, LPN [facility]  01/30/[birth year]66  8:00 AM [facility]  08/04/2018 10:00 AM Einar Pheasant, MD [facility]   Primary Care Physician: Einar Pheasant, MD Location: [facility] Note by: Gaspar Cola, MD Date: 04/18/2018; Time: 12:47 PM  Disclaimer:  Medicine is not an Chief Strategy Officer. The only guarantee in medicine is that nothing is guaranteed. It is important to note that the decision to proceed with this intervention was based on the information collected from the patient. The Data and conclusions were drawn from the patient's questionnaire, the interview, and the physical examination. Because the information was provided in large part by the patient, it cannot be guaranteed that it has not been purposely or unconsciously manipulated. Every effort has been made to obtain as much relevant data as possible for this evaluation. It is important to note that the conclusions that lead to this procedure are derived in large part from the available data. Always take into account that the treatment will also be dependent on availability of resources and existing treatment guidelines, considered by other Pain Management Practitioners as being common knowledge and practice, at the time of the intervention. For Medico-Legal purposes, it is also important to point out that variation in procedural techniques and pharmacological choices are the acceptable norm. The  indications, contraindications, technique, and results of the above procedure should only be interpreted and judged by a Board-Certified Interventional Pain Specialist with extensive familiarity and expertise in the same exact procedure and technique.

## 2018-04-18 NOTE — Patient Instructions (Signed)

## 2018-04-18 NOTE — Progress Notes (Signed)
Safety precautions to be maintained throughout the outpatient stay will include: orient to surroundings, keep bed in low position, maintain call bell within reach at all times, provide assistance with transfer out of bed and ambulation.  

## 2018-04-19 ENCOUNTER — Telehealth: Payer: Self-pay

## 2018-04-19 NOTE — Telephone Encounter (Signed)
States his back is a little sore today, Instructed to use heat and to wait a few days for the steroid to work. Instructed to call if needed.

## 2018-04-20 ENCOUNTER — Ambulatory Visit (INDEPENDENT_AMBULATORY_CARE_PROVIDER_SITE_OTHER): Payer: Medicare Other

## 2018-04-20 DIAGNOSIS — E538 Deficiency of other specified B group vitamins: Secondary | ICD-10-CM

## 2018-04-20 MED ORDER — CYANOCOBALAMIN 1000 MCG/ML IJ SOLN
1000.0000 ug | Freq: Once | INTRAMUSCULAR | Status: AC
  Administered 2018-04-20: 1000 ug via INTRAMUSCULAR

## 2018-04-20 NOTE — Progress Notes (Addendum)
Patient comes in for B 12 injection.  Injected left deltoid.  Patient tolerated injection well.   Reviewed.  Dr Scott 

## 2018-05-15 ENCOUNTER — Encounter: Payer: Self-pay | Admitting: Nurse Practitioner

## 2018-05-15 ENCOUNTER — Ambulatory Visit: Payer: Medicare Other | Attending: Nurse Practitioner | Admitting: Nurse Practitioner

## 2018-05-15 VITALS — BP 140/87 | HR 57 | Temp 98.0°F | Resp 16 | Ht 68.0 in | Wt 245.0 lb

## 2018-05-15 DIAGNOSIS — M25511 Pain in right shoulder: Secondary | ICD-10-CM | POA: Insufficient documentation

## 2018-05-15 DIAGNOSIS — E559 Vitamin D deficiency, unspecified: Secondary | ICD-10-CM | POA: Diagnosis not present

## 2018-05-15 DIAGNOSIS — K219 Gastro-esophageal reflux disease without esophagitis: Secondary | ICD-10-CM | POA: Insufficient documentation

## 2018-05-15 DIAGNOSIS — M48061 Spinal stenosis, lumbar region without neurogenic claudication: Secondary | ICD-10-CM | POA: Insufficient documentation

## 2018-05-15 DIAGNOSIS — G8929 Other chronic pain: Secondary | ICD-10-CM | POA: Insufficient documentation

## 2018-05-15 DIAGNOSIS — K2270 Barrett's esophagus without dysplasia: Secondary | ICD-10-CM | POA: Diagnosis not present

## 2018-05-15 DIAGNOSIS — I10 Essential (primary) hypertension: Secondary | ICD-10-CM | POA: Insufficient documentation

## 2018-05-15 DIAGNOSIS — K5792 Diverticulitis of intestine, part unspecified, without perforation or abscess without bleeding: Secondary | ICD-10-CM | POA: Insufficient documentation

## 2018-05-15 DIAGNOSIS — G4730 Sleep apnea, unspecified: Secondary | ICD-10-CM | POA: Diagnosis not present

## 2018-05-15 DIAGNOSIS — M4726 Other spondylosis with radiculopathy, lumbar region: Secondary | ICD-10-CM | POA: Insufficient documentation

## 2018-05-15 DIAGNOSIS — M25512 Pain in left shoulder: Secondary | ICD-10-CM | POA: Diagnosis not present

## 2018-05-15 DIAGNOSIS — E039 Hypothyroidism, unspecified: Secondary | ICD-10-CM | POA: Insufficient documentation

## 2018-05-15 DIAGNOSIS — E7800 Pure hypercholesterolemia, unspecified: Secondary | ICD-10-CM | POA: Insufficient documentation

## 2018-05-15 DIAGNOSIS — Z79890 Hormone replacement therapy (postmenopausal): Secondary | ICD-10-CM | POA: Insufficient documentation

## 2018-05-15 DIAGNOSIS — Z5181 Encounter for therapeutic drug level monitoring: Secondary | ICD-10-CM | POA: Insufficient documentation

## 2018-05-15 DIAGNOSIS — M47816 Spondylosis without myelopathy or radiculopathy, lumbar region: Secondary | ICD-10-CM | POA: Diagnosis not present

## 2018-05-15 DIAGNOSIS — Z8719 Personal history of other diseases of the digestive system: Secondary | ICD-10-CM | POA: Diagnosis not present

## 2018-05-15 DIAGNOSIS — M25552 Pain in left hip: Secondary | ICD-10-CM | POA: Insufficient documentation

## 2018-05-15 DIAGNOSIS — Z8546 Personal history of malignant neoplasm of prostate: Secondary | ICD-10-CM | POA: Diagnosis not present

## 2018-05-15 DIAGNOSIS — Z7982 Long term (current) use of aspirin: Secondary | ICD-10-CM | POA: Insufficient documentation

## 2018-05-15 DIAGNOSIS — Z79899 Other long term (current) drug therapy: Secondary | ICD-10-CM | POA: Diagnosis not present

## 2018-05-15 DIAGNOSIS — G894 Chronic pain syndrome: Secondary | ICD-10-CM

## 2018-05-15 DIAGNOSIS — M7918 Myalgia, other site: Secondary | ICD-10-CM | POA: Diagnosis not present

## 2018-05-15 MED ORDER — TRAMADOL HCL 50 MG PO TABS: 50.0000 mg | ORAL_TABLET | Freq: Three times a day (TID) | ORAL | 2 refills | Status: DC | PRN

## 2018-05-15 NOTE — Progress Notes (Signed)
Nursing Pain Medication Assessment:  Safety precautions to be maintained throughout the outpatient stay will include: orient to surroundings, keep bed in low position, maintain call bell within reach at all times, provide assistance with transfer out of bed and ambulation.  Medication Inspection Compliance: Pill count conducted under aseptic conditions, in front of the patient. Neither the pills nor the bottle was removed from the patient's sight at any time. Once count was completed pills were immediately returned to the patient in their original bottle.  Medication: Tramadol (Ultram) Pill/Patch Count: 75 of 90 pills remain Pill/Patch Appearance: Markings consistent with prescribed medication Bottle Appearance: Standard pharmacy container. Clearly labeled. Filled Date: 09 / 17 / 2019 Last Medication intake:  Today

## 2018-05-15 NOTE — Progress Notes (Signed)
Patient's Name: Hector [name]  MRN: [id number]  Referring Provider: Einar Pheasant, MD  DOB: 26-Oct-1934  PCP: Einar Pheasant, MD  DOS: 05/15/2018  Note by: Vevelyn Francois NP  Service setting: Ambulatory outpatient  Specialty: Interventional Pain Management  Location: [facility] (AMB) Pain Management Facility    Patient type: Established    Primary Reason(s) for Visit: Encounter for prescription drug management. (Level of risk: moderate)  CC: Back Pain and Shoulder Pain (right)  HPI  Hector [name] is a 83 y.o. year old, male patient, who comes today for a medication management evaluation. He has Hypertension; Hypercholesterolemia; Sleep apnea; History of prostate cancer; Malignant melanoma ([place]); History of colonic polyps; Barrett's esophagus; BMI 37.0-37.9, adult; Pharmacologic therapy; Shoulder pain, left; Chronic hip pain (Left); Chronic low back pain (Primary Area of Pain) (Bilateral) (L>R); Chronic pain syndrome; Other long term (current) drug therapy; Other reduced mobility; Other specified health status; Polyarthritis, unspecified (Secondary Area of Pain); L5-S1 bilateral pars defect with spondylolisthesis; Lumbosacral Grade 1 Anterolisthesis of L5 over S1; Lumbar facet arthropathy (Bilateral); Lumbar foraminal stenosis (L5-S1) (Bilateral) (L>R); Chronic Lumbar radiculitis (L5) (Left); Disorder of skeletal system; Problems influencing health status; Opiate use; Chronic musculoskeletal pain; Vitamin D deficiency; Lumbar facet syndrome (Bilateral) (L>R); Chronic lower extremity pain (Secondary Area of Pain) (Left); Chronic lower extremity radicular pain (L5) (Left); Lumbar facet osteoarthritis (Bilateral); Osteoarthritis of lumbar spine; Lumbar spondylosis; Spondylosis without myelopathy or radiculopathy, lumbosacral region; Acute postoperative pain; Asymptomatic PVCs; Hypothyroid; B12 deficiency; and Chronic right shoulder pain on their problem list. His primarily concern today is the Back Pain and Shoulder  Pain (right)  Pain Assessment: Location: Lower, Right(right shoulder) Back(shoulder) Radiating: shoulder pain is going down right arm.  back pain is better s/p right RFA Onset: More than a month ago Duration: Chronic pain Quality: Discomfort, Aching, Nagging, Numbness, Tingling(when he lays on right side gets the numbness and tingling) Severity: 2 /10 (subjective, self-reported pain score)  Note: Reported level is compatible with observation.                          Effect on ADL: shoulder pain is aggravating  Timing: Intermittent Modifying factors: Right RFA helped and medications helps the shoulder BP: 140/87  HR: (!) 57  Hector [name] was last scheduled for an appointment on Visit date not found for medication management. During today's appointment we reviewed Hector [name]'s chronic pain status, as well as his outpatient medication regimen.  He admits that he has occasional right shoulder pain.  He admits that he uses the tramadol and Tylenol which is effective for the treatment of his pain.  The patient  reports that he does not use drugs. His body mass index is 37.25 kg/m.  Further details on both, my assessment(s), as well as the proposed treatment plan, please see below.  Controlled Substance Pharmacotherapy Assessment REMS (Risk Evaluation and Mitigation Strategy)  Analgesic: Tramadol 50 mg 1 tablet every 8 hours (tramadol 150 mg/day) MME/day: 150 mg/day.  Janett Billow, RN  05/15/2018  8:55 AM  Sign at close encounter Nursing Pain Medication Assessment:  Safety precautions to be maintained throughout the outpatient stay will include: orient to surroundings, keep bed in low position, maintain call bell within reach at all times, provide assistance with transfer out of bed and ambulation.  Medication Inspection Compliance: Pill count conducted under aseptic conditions, in front of the patient. Neither the pills nor the bottle was removed from the patient's sight at  any time. Once  count was completed pills were immediately returned to the patient in their original bottle.  Medication: Tramadol (Ultram) Pill/Patch Count: 75 of 90 pills remain Pill/Patch Appearance: Markings consistent with prescribed medication Bottle Appearance: Standard pharmacy container. Clearly labeled. Filled Date: 09 / 17 / 2019 Last Medication intake:  Today   Pharmacokinetics: Liberation and absorption (onset of action): WNL Distribution (time to peak effect): WNL Metabolism and excretion (duration of action): WNL         Pharmacodynamics: Desired effects: Analgesia: Hector [name] reports >50% benefit. Functional ability: Patient reports that medication allows him to accomplish basic ADLs Clinically meaningful improvement in function (CMIF): Sustained CMIF goals met Perceived effectiveness: Described as relatively effective, allowing for increase in activities of daily living (ADL) Undesirable effects: Side-effects or Adverse reactions: None reported Monitoring: [place] PMP: Online review of the past 50-monthperiod conducted. Compliant with practice rules and regulations Last UDS on record: No results found for: SUMMARY UDS interpretation: Compliant          Medication Assessment Form: Reviewed. Patient indicates being compliant with therapy Treatment compliance: Compliant Risk Assessment Profile: Aberrant behavior: See prior evaluations. None observed or detected today Comorbid factors increasing risk of overdose: See prior notes. No additional risks detected today Opioid risk tool (ORT) (Total Score):   Personal History of Substance Abuse (SUD-Substance use disorder):  Alcohol:    Illegal Drugs:    Rx Drugs:    ORT Risk Level calculation:   Risk of substance use disorder (SUD): Low  ORT Scoring interpretation table:  Score <3 = Low Risk for SUD  Score between 4-7 = Moderate Risk for SUD  Score >8 = High Risk for Opioid Abuse   Risk Mitigation Strategies:  Patient Counseling:  Covered Patient-Prescriber Agreement (PPA): Present and active  Notification to other healthcare providers: Done  Pharmacologic Plan: No change in therapy, at this time.            Post-Procedure Assessment  04/18/2018 procedure: Right lumbar facet radiofrequency ablations Pre-procedure pain score:  1/10 Post-procedure pain score: 1/10         Influential Factors: BMI: 37.25 kg/m Intra-procedural challenges: None observed.         Assessment challenges: None detected.              Reported side-effects: None.        Post-procedural adverse reactions or complications: None reported         Sedation: Please see nurses note. When no sedatives are used, the analgesic levels obtained are directly associated to the effectiveness of the local anesthetics. However, when sedation is provided, the level of analgesia obtained during the initial 1 hour following the intervention, is believed to be the result of a combination of factors. These factors may include, but are not limited to: 1. The effectiveness of the local anesthetics used. 2. The effects of the analgesic(s) and/or anxiolytic(s) used. 3. The degree of discomfort experienced by the patient at the time of the procedure. 4. The patients ability and reliability in recalling and recording the events. 5. The presence and influence of possible secondary gains and/or psychosocial factors. Reported result: Relief experienced during the 1st hour after the procedure: 100 % (Ultra-Short Term Relief)            Interpretative annotation: Clinically appropriate result. Analgesia during this period is likely to be Local Anesthetic and/or IV Sedative (Analgesic/Anxiolytic) related.          Effects of local  anesthetic: The analgesic effects attained during this period are directly associated to the localized infiltration of local anesthetics and therefore cary significant diagnostic value as to the etiological location, or anatomical origin, of the pain.  Expected duration of relief is directly dependent on the pharmacodynamics of the local anesthetic used. Long-acting (4-6 hours) anesthetics used.  Reported result: Relief during the next 4 to 6 hour after the procedure: 100 % (Short-Term Relief)            Interpretative annotation: Clinically appropriate result. Analgesia during this period is likely to be Local Anesthetic-related.          Long-term benefit: Defined as the period of time past the expected duration of local anesthetics (1 hour for short-acting and 4-6 hours for long-acting). With the possible exception of prolonged sympathetic blockade from the local anesthetics, benefits during this period are typically attributed to, or associated with, other factors such as analgesic sensory neuropraxia, antiinflammatory effects, or beneficial biochemical changes provided by agents other than the local anesthetics.  Reported result: Extended relief following procedure: 100 %(continues to have pain late in the evening after exertion and he can take a tramadol and it will ease up ) (Long-Term Relief)            Interpretative annotation: Clinically possible results. Good relief. No permanent benefit expected. Inflammation plays a part in the etiology to the pain.          Current benefits: Defined as reported results that persistent at this point in time.   Analgesia: 75-100 %            Function: Somewhat improved ROM: Somewhat improved Interpretative annotation: Ongoing benefit.    Adequate RF ablation.          Interpretation: Results would suggest adequate radiofrequency ablation.                  Plan:  Please see "Plan of Care" for details.                Laboratory Chemistry  Inflammation Markers (CRP: Acute Phase) (ESR: Chronic Phase) Lab Results  Component Value Date   CRP 2.8 06/13/2017   ESRSEDRATE 13 06/13/2017                         Rheumatology Markers Lab Results  Component Value Date   LABURIC 7.5 01/23/2018                         Renal Function Markers Lab Results  Component Value Date   BUN 27 (H) 03/28/2018   CREATININE 1.45 03/28/2018   BCR 10 06/13/2017   GFRAA >60 01/23/2018   GFRNONAA 56 (L) 01/23/2018                             Hepatic Function Markers Lab Results  Component Value Date   AST 28 03/28/2018   ALT 29 03/28/2018   ALBUMIN 4.3 03/28/2018   ALKPHOS 54 03/28/2018                        Electrolytes Lab Results  Component Value Date   NA 139 03/28/2018   K 3.9 03/28/2018   CL 101 03/28/2018   CALCIUM 10.1 03/28/2018   MG 2.0 06/13/2017  Neuropathy Markers Lab Results  Component Value Date   VITAMINB12 198 (L) 03/28/2018   HGBA1C 5.7 03/28/2018                        CNS Tests No results found for: COLORCSF, APPEARCSF, RBCCOUNTCSF, WBCCSF, POLYSCSF, LYMPHSCSF, EOSCSF, PROTEINCSF, GLUCCSF, JCVIRUS, CSFOLI, IGGCSF                      Bone Pathology Markers Lab Results  Component Value Date   VD25OH 21.46 (L) 03/28/2018   25OHVITD1 20 (L) 06/13/2017   25OHVITD2 <1.0 06/13/2017   25OHVITD3 20 06/13/2017                         Coagulation Parameters Lab Results  Component Value Date   PLT 226.0 11/14/2017                        Cardiovascular Markers Lab Results  Component Value Date   HGB 15.3 11/14/2017   HCT 45.3 11/14/2017                         CA Markers No results found for: CEA, CA125, LABCA2                      Note: Lab results reviewed.  Recent Diagnostic Imaging Results  DG C-Arm 1-60 Min-No Report Fluoroscopy was utilized by the requesting physician.  No radiographic  interpretation.   Complexity Note: Imaging results reviewed. Results shared with Mr. Wittmeyer, using Layman's terms.                         Meds   Current Outpatient Medications:  .  acetaminophen (TYLENOL) 500 MG tablet, Take 500 mg by mouth every 6 (six) hours as needed., Disp: , Rfl:  .  amLODipine (NORVASC) 5 MG tablet, Take 1 tablet  (5 mg total) by mouth daily., Disp: 30 tablet, Rfl: 5 .  aspirin 81 MG tablet, Take 81 mg by mouth daily., Disp: , Rfl:  .  fluticasone (FLONASE) 50 MCG/ACT nasal spray, TWO PUFFS IN EACH NOSTRIL ONCE A DAY, Disp: 16 g, Rfl: 4 .  hydrochlorothiazide (HYDRODIURIL) 25 MG tablet, Take 1 tablet (25 mg total) by mouth daily., Disp: 30 tablet, Rfl: 5 .  hyoscyamine (LEVSIN, ANASPAZ) 0.125 MG tablet, Take 0.125 mg by mouth as needed., Disp: , Rfl:  .  irbesartan (AVAPRO) 300 MG tablet, Take 1 tablet (300 mg total) by mouth daily., Disp: 30 tablet, Rfl: 5 .  L-Lysine 500 MG TABS, Take by mouth daily., Disp: , Rfl:  .  levothyroxine (SYNTHROID) 100 MCG tablet, Take 1 tablet (100 mcg total) by mouth daily before breakfast., Disp: 30 tablet, Rfl: 3 .  omeprazole (PRILOSEC) 20 MG capsule, TAKE ONE CAPSULE BY MOUTH TWICE A DAY, Disp: 60 capsule, Rfl: 3 .  [START ON 07/24/2018] traMADol (ULTRAM) 50 MG tablet, Take 1 tablet (50 mg total) by mouth every 8 (eight) hours as needed for severe pain., Disp: 90 tablet, Rfl: 2 .  valACYclovir (VALTREX) 1000 MG tablet, TAKE 1 TABLET BY MOUTH EVERY DAY, Disp: 30 tablet, Rfl: 3 .  Calcium Carb-Cholecalciferol (CALCIUM PLUS D3 ABSORBABLE) [phone] MG-UNIT CAPS, Take 1 capsule by mouth daily with breakfast., Disp: 90 capsule, Rfl: 0  ROS  Constitutional: Denies any fever or chills  Gastrointestinal: No reported hemesis, hematochezia, vomiting, or acute GI distress Musculoskeletal: Denies any acute onset joint swelling, redness, loss of ROM, or weakness Neurological: No reported episodes of acute onset apraxia, aphasia, dysarthria, agnosia, amnesia, paralysis, loss of coordination, or loss of consciousness  Allergies  Mr. Mccay is allergic to tape.  Bell  Drug: Mr. Nafziger  reports that he does not use drugs. Alcohol:  reports that he does not drink alcohol. Tobacco:  reports that he has never smoked. He has never used smokeless tobacco. Medical:  has a past medical history  of Anemia, Arthritis, Barrett esophagus, Cancer ([place]), Chicken pox, Diverticulitis, Dysrhythmia, GERD (gastroesophageal reflux disease), Hyperlipidemia, Hypertension, Hypothyroidism, Melanoma ([place]), Sleep apnea, and Ulcer. Surgical: Mr. Krotz  has a past surgical history that includes Prostate surgery; cystocopy (2003); Hemorrhoid surgery; Cardiac catheterization; sleep study; Colon surgery (2006-2008-2011); Cataract Surgery (Right, 02/13/14); Esophagogastroduodenoscopy (egd) with propofol (N/A, 08/04/2016); Colonoscopy w/ polypectomy; and Colonoscopy with propofol (N/A, 02/15/2018). Family: family history includes Heart disease in his father; Kidney disease in his father; Liver disease in his mother.  Constitutional Exam  General appearance: Well nourished, well developed, and well hydrated. In no apparent acute distress Vitals:   05/15/18 0852  BP: 140/87  Pulse: (!) 57  Resp: 16  Temp: 98 F (36.7 C)  TempSrc: Oral  SpO2: 99%  Weight: 245 lb (111.1 kg)  Height: '5\' 8"'  (1.727 m)  Psych/Mental status: Alert, oriented x 3 (person, place, & time)       Eyes: PERLA Respiratory: No evidence of acute respiratory distress  Upper Extremity (UE) Exam    Side: Right upper extremity  Side: Left upper extremity  Skin & Extremity Inspection: Skin color, temperature, and hair growth are WNL. No peripheral edema or cyanosis. No masses, redness, swelling, asymmetry, or associated skin lesions. No contractures.  Skin & Extremity Inspection: Skin color, temperature, and hair growth are WNL. No peripheral edema or cyanosis. No masses, redness, swelling, asymmetry, or associated skin lesions. No contractures.  Functional ROM: Adequate ROM          Functional ROM: Unrestricted ROM          Muscle Tone/Strength: Functionally intact. No obvious neuro-muscular anomalies detected.  Muscle Tone/Strength: Functionally intact. No obvious neuro-muscular anomalies detected.  Sensory (Neurological): Unimpaired           Sensory (Neurological): Unimpaired          Palpation: Tender              Palpation: No palpable anomalies                   Lumbar Spine Area Exam  Skin & Axial Inspection: No masses, redness, or swelling Alignment: Symmetrical Functional ROM: Unrestricted ROM       Stability: No instability detected Muscle Tone/Strength: Functionally intact. No obvious neuro-muscular anomalies detected. Sensory (Neurological): Unimpaired Palpation: Tender         Gait & Posture Assessment  Ambulation: Unassisted Gait: Relatively normal for age and body habitus Posture: WNL   Lower Extremity Exam    Side: Right lower extremity  Side: Left lower extremity  Stability: No instability observed          Stability: No instability observed          Skin & Extremity Inspection: Skin color, temperature, and hair growth are WNL. No peripheral edema or cyanosis. No masses, redness, swelling, asymmetry, or associated skin lesions. No contractures.  Skin & Extremity Inspection: Skin color, temperature, and  hair growth are WNL. No peripheral edema or cyanosis. No masses, redness, swelling, asymmetry, or associated skin lesions. No contractures.  Functional ROM: Unrestricted ROM                  Functional ROM: Unrestricted ROM                  Muscle Tone/Strength: Functionally intact. No obvious neuro-muscular anomalies detected.  Muscle Tone/Strength: Functionally intact. No obvious neuro-muscular anomalies detected.  Sensory (Neurological): Unimpaired  Sensory (Neurological): Unimpaired  Palpation: No palpable anomalies  Palpation: No palpable anomalies   Assessment  Primary Diagnosis & Pertinent Problem List: The primary encounter diagnosis was Lumbar spondylosis. Diagnoses of Chronic right shoulder pain, Chronic musculoskeletal pain, Chronic pain syndrome, and Vitamin D deficiency were also pertinent to this visit.  Status Diagnosis  Controlled Persistent Controlled 1. Lumbar spondylosis   2. Chronic  right shoulder pain   3. Chronic musculoskeletal pain   4. Chronic pain syndrome   5. Vitamin D deficiency     Problems updated and reviewed during this visit: Problem  Chronic Right Shoulder Pain   Plan of Care  Pharmacotherapy (Medications Ordered): Meds ordered this encounter  Medications  . traMADol (ULTRAM) 50 MG tablet    Sig: Take 1 tablet (50 mg total) by mouth every 8 (eight) hours as needed for severe pain.    Dispense:  90 tablet    Refill:  2    Do not place this medication, or any other prescription from our practice, on "Automatic Refill". Patient may have prescription filled one day early if pharmacy is closed on scheduled refill date.    Order Specific Question:   Supervising Provider    Answer:   Milinda Pointer [[id number]]   New Prescriptions   No medications on file   Medications administered today: Dyllen A. Myszka had no medications administered during this visit. Lab-work, procedure(s), and/or referral(s): No orders of the defined types were placed in this encounter.  Imaging and/or referral(s): None  Interventional management options: Planned, scheduled, and/or pending:   Not at this time.  Consider diagnostic right intra-articular shoulder injection   Considering:   Diagnostic bilateral lumbar facet block#3 Possible bilateral lumbar facet RFA Diagnostic left L5-S1 transforaminal epidural steroid injection  Possible left L5 nerve root ganglion RFA Diagnosticleft sided L4-5 interlaminar lumbar epidural steroid injection   Palliative PRN treatment(s):   Palliativebilateral lumbar facet block    Provider-requested follow-up: Return in about 6 months (around 11/14/2018) for MedMgmt.  Future Appointments  Date Time Provider [place]  05/25/2018  9:00 AM O'Brien-Blaney, Bryson Corona, LPN [facility]  02/04/3004  8:00 AM [facility]  08/04/2018 10:00 AM Einar Pheasant, MD [facility]  11/14/2018  9:00 AM Vevelyn Francois, NP [facility] None   Primary Care Physician: Einar Pheasant, MD Location: [facility] Out[facility] Management Facility Note by: Vevelyn Francois NP Date: 05/15/2018; Time: 10:04 AM  Pain Score Disclaimer: We use the NRS-11 scale. This is a self-reported, subjective measurement of pain severity with only modest accuracy. It is used primarily to identify changes within a particular patient. It must be understood that outpatient pain scales are significantly less accurate that those used for research, where they can be applied under ideal controlled circumstances with minimal exposure to variables. In reality, the score is likely to be a combination of pain intensity and pain affect, where pain affect describes the degree of emotional arousal or changes in action readiness caused  by the sensory experience of pain. Factors such as social and work situation, setting, emotional state, anxiety levels, expectation, and prior pain experience may influence pain perception and show large inter-individual differences that may also be affected by time variables.  Patient instructions provided during this appointment: Patient Instructions  ____________________________________________________________________________________________  Medication Rules  Applies to: All patients receiving prescriptions (written or electronic).  Pharmacy of record: Pharmacy where electronic prescriptions will be sent. If written prescriptions are taken to a different pharmacy, please inform the nursing staff. The pharmacy listed in the electronic medical record should be the one where you would like electronic prescriptions to be sent.  Prescription refills: Only during scheduled appointments. Applies to both, written and electronic prescriptions.  NOTE: The following applies primarily to controlled substances (Opioid* Pain Medications).   Patient's responsibilities: 1. Pain Pills: Bring all pain pills to every appointment  (except for procedure appointments). 2. Pill Bottles: Bring pills in original pharmacy bottle. Always bring newest bottle. Bring bottle, even if empty. 3. Medication refills: You are responsible for knowing and keeping track of what medications you need refilled. The day before your appointment, write a list of all prescriptions that need to be refilled. Bring that list to your appointment and give it to the admitting nurse. Prescriptions will be written only during appointments. If you forget a medication, it will not be "Called in", "Faxed", or "electronically sent". You will need to get another appointment to get these prescribed. 4. Prescription Accuracy: You are responsible for carefully inspecting your prescriptions before leaving our office. Have the discharge nurse carefully go over each prescription with you, before taking them home. Make sure that your name is accurately spelled, that your address is correct. Check the name and dose of your medication to make sure it is accurate. Check the number of pills, and the written instructions to make sure they are clear and accurate. Make sure that you are given enough medication to last until your next medication refill appointment. 5. Taking Medication: Take medication as prescribed. Never take more pills than instructed. Never take medication more frequently than prescribed. Taking less pills or less frequently is permitted and encouraged, when it comes to controlled substances (written prescriptions).  6. Inform other Doctors: Always inform, all of your healthcare providers, of all the medications you take. 7. Pain Medication from other Providers: You are not allowed to accept any additional pain medication from any other Doctor or Healthcare provider. There are two exceptions to this rule. (see below) In the event that you require additional pain medication, you are responsible for notifying us, as stated below. 8. Medication Agreement: You are  responsible for carefully reading and following our Medication Agreement. This must be signed before receiving any prescriptions from our practice. Safely store a copy of your signed Agreement. Violations to the Agreement will result in no further prescriptions. (Additional copies of our Medication Agreement are available upon request.) 9. Laws, Rules, & Regulations: All patients are expected to follow all Federal and [employer], Rules, [employer]. Ignorance of the Laws does not constitute a valid excuse. The use of any illegal substances is prohibited. 10. Adopted CDC guidelines & recommendations: Target dosing levels will be at or below 60 MME/day. Use of benzodiazepines** is not recommended.  Exceptions: There are only two exceptions to the rule of not receiving pain medications from other Healthcare Providers. 1. Exception #1 (Emergencies): In the event of an emergency (i.e.: accident requiring emergency care), you are allowed to  receive additional pain medication. However, you are responsible for: As soon as you are able, call our office [phone], at any time of the day or night, and leave a message stating your name, the date and nature of the emergency, and the name and dose of the medication prescribed. In the event that your call is answered by a member of our staff, make sure to document and save the date, time, and the name of the person that took your information.  2. Exception #2 (Planned Surgery): In the event that you are scheduled by another doctor or dentist to have any type of surgery or procedure, you are allowed (for a period no longer than 30 days), to receive additional pain medication, for the acute post-op pain. However, in this case, you are responsible for picking up a copy of our "Post-op Pain Management for Surgeons" handout, and giving it to your surgeon or dentist. This document is available at our office, and does not require an appointment to obtain it. Simply  go to our office during business hours (Monday-Thursday from 8:00 AM to 4:00 PM) (Friday 8:00 AM to 12:00 Noon) or if you have a scheduled appointment with [place], prior to your surgery, and ask for it by name. In addition, you will need to provide [place] with your name, name of your surgeon, type of surgery, and date of procedure or surgery.  *Opioid medications include: morphine, codeine, oxycodone, oxymorphone, hydrocodone, hydromorphone, meperidine, tramadol, tapentadol, buprenorphine, fentanyl, methadone. **Benzodiazepine medications include: diazepam (Valium), alprazolam (Xanax), clonazepam (Klonopine), lorazepam (Ativan), clorazepate (Tranxene), chlordiazepoxide (Librium), estazolam (Prosom), oxazepam (Serax), temazepam (Restoril), triazolam (Halcion) (Last updated: 09/29/2017) ____________________________________________________________________________________________   Tramadol RX escribed to pharmacy to begin filling on 07/24/18

## 2018-05-15 NOTE — Patient Instructions (Addendum)
____________________________________________________________________________________________  Medication Rules  Applies to: All patients receiving prescriptions (written or electronic).  Pharmacy of record: Pharmacy where electronic prescriptions will be sent. If written prescriptions are taken to a different pharmacy, please inform the nursing staff. The pharmacy listed in the electronic medical record should be the one where you would like electronic prescriptions to be sent.  Prescription refills: Only during scheduled appointments. Applies to both, written and electronic prescriptions.  NOTE: The following applies primarily to controlled substances (Opioid* Pain Medications).   Patient's responsibilities: 1. Pain Pills: Bring all pain pills to every appointment (except for procedure appointments). 2. Pill Bottles: Bring pills in original pharmacy bottle. Always bring newest bottle. Bring bottle, even if empty. 3. Medication refills: You are responsible for knowing and keeping track of what medications you need refilled. The day before your appointment, write a list of all prescriptions that need to be refilled. Bring that list to your appointment and give it to the admitting nurse. Prescriptions will be written only during appointments. If you forget a medication, it will not be "Called in", "Faxed", or "electronically sent". You will need to get another appointment to get these prescribed. 4. Prescription Accuracy: You are responsible for carefully inspecting your prescriptions before leaving our office. Have the discharge nurse carefully go over each prescription with you, before taking them home. Make sure that your name is accurately spelled, that your address is correct. Check the name and dose of your medication to make sure it is accurate. Check the number of pills, and the written instructions to make sure they are clear and accurate. Make sure that you are given enough medication to last  until your next medication refill appointment. 5. Taking Medication: Take medication as prescribed. Never take more pills than instructed. Never take medication more frequently than prescribed. Taking less pills or less frequently is permitted and encouraged, when it comes to controlled substances (written prescriptions).  6. Inform other Doctors: Always inform, all of your healthcare providers, of all the medications you take. 7. Pain Medication from other Providers: You are not allowed to accept any additional pain medication from any other Doctor or Healthcare provider. There are two exceptions to this rule. (see below) In the event that you require additional pain medication, you are responsible for notifying us, as stated below. 8. Medication Agreement: You are responsible for carefully reading and following our Medication Agreement. This must be signed before receiving any prescriptions from our practice. Safely store a copy of your signed Agreement. Violations to the Agreement will result in no further prescriptions. (Additional copies of our Medication Agreement are available upon request.) 9. Laws, Rules, & Regulations: All patients are expected to follow all Federal and State Laws, Statutes, Rules, & Regulations. Ignorance of the Laws does not constitute a valid excuse. The use of any illegal substances is prohibited. 10. Adopted CDC guidelines & recommendations: Target dosing levels will be at or below 60 MME/day. Use of benzodiazepines** is not recommended.  Exceptions: There are only two exceptions to the rule of not receiving pain medications from other Healthcare Providers. 1. Exception #1 (Emergencies): In the event of an emergency (i.e.: accident requiring emergency care), you are allowed to receive additional pain medication. However, you are responsible for: As soon as you are able, call our office [phone], at any time of the day or night, and leave a message stating your name, the  date and nature of the emergency, and the name and dose of the medication   prescribed. In the event that your call is answered by a member of our staff, make sure to document and save the date, time, and the name of the person that took your information.  2. Exception #2 (Planned Surgery): In the event that you are scheduled by another doctor or dentist to have any type of surgery or procedure, you are allowed (for a period no longer than 30 days), to receive additional pain medication, for the acute post-op pain. However, in this case, you are responsible for picking up a copy of our "Post-op Pain Management for Surgeons" handout, and giving it to your surgeon or dentist. This document is available at our office, and does not require an appointment to obtain it. Simply go to our office during business hours (Monday-Thursday from 8:00 AM to 4:00 PM) (Friday 8:00 AM to 12:00 Noon) or if you have a scheduled appointment with [place], prior to your surgery, and ask for it by name. In addition, you will need to provide [place] with your name, name of your surgeon, type of surgery, and date of procedure or surgery.  *Opioid medications include: morphine, codeine, oxycodone, oxymorphone, hydrocodone, hydromorphone, meperidine, tramadol, tapentadol, buprenorphine, fentanyl, methadone. **Benzodiazepine medications include: diazepam (Valium), alprazolam (Xanax), clonazepam (Klonopine), lorazepam (Ativan), clorazepate (Tranxene), chlordiazepoxide (Librium), estazolam (Prosom), oxazepam (Serax), temazepam (Restoril), triazolam (Halcion) (Last updated: 09/29/2017) ____________________________________________________________________________________________   Tramadol RX escribed to pharmacy to begin filling on 07/24/18

## 2018-05-25 ENCOUNTER — Ambulatory Visit (INDEPENDENT_AMBULATORY_CARE_PROVIDER_SITE_OTHER): Payer: Medicare Other

## 2018-05-25 VITALS — BP 138/70 | HR 62 | Temp 98.3°F | Resp 16 | Ht 68.0 in | Wt 245.1 lb

## 2018-05-25 DIAGNOSIS — E039 Hypothyroidism, unspecified: Secondary | ICD-10-CM | POA: Diagnosis not present

## 2018-05-25 DIAGNOSIS — E538 Deficiency of other specified B group vitamins: Secondary | ICD-10-CM

## 2018-05-25 DIAGNOSIS — Z0000 Encounter for general adult medical examination without abnormal findings: Secondary | ICD-10-CM | POA: Diagnosis not present

## 2018-05-25 DIAGNOSIS — R944 Abnormal results of kidney function studies: Secondary | ICD-10-CM | POA: Diagnosis not present

## 2018-05-25 DIAGNOSIS — D1801 Hemangioma of skin and subcutaneous tissue: Secondary | ICD-10-CM | POA: Diagnosis not present

## 2018-05-25 DIAGNOSIS — D225 Melanocytic nevi of trunk: Secondary | ICD-10-CM | POA: Diagnosis not present

## 2018-05-25 DIAGNOSIS — L821 Other seborrheic keratosis: Secondary | ICD-10-CM | POA: Diagnosis not present

## 2018-05-25 DIAGNOSIS — Z85820 Personal history of malignant melanoma of skin: Secondary | ICD-10-CM | POA: Diagnosis not present

## 2018-05-25 DIAGNOSIS — L570 Actinic keratosis: Secondary | ICD-10-CM | POA: Diagnosis not present

## 2018-05-25 LAB — URINALYSIS, ROUTINE W REFLEX MICROSCOPIC
Bilirubin Urine: NEGATIVE
HGB URINE DIPSTICK: NEGATIVE
Ketones, ur: NEGATIVE
Leukocytes, UA: NEGATIVE
NITRITE: NEGATIVE
RBC / HPF: NONE SEEN (ref 0–?)
Specific Gravity, Urine: 1.025 (ref 1.000–1.030)
TOTAL PROTEIN, URINE-UPE24: NEGATIVE
URINE GLUCOSE: NEGATIVE
Urobilinogen, UA: 0.2 (ref 0.0–1.0)
pH: 5.5 (ref 5.0–8.0)

## 2018-05-25 LAB — BASIC METABOLIC PANEL
BUN: 19 mg/dL (ref 6–23)
CALCIUM: 9.8 mg/dL (ref 8.4–10.5)
CO2: 30 meq/L (ref 19–32)
Chloride: 100 mEq/L (ref 96–112)
Creatinine, Ser: 1.42 mg/dL (ref 0.40–1.50)
GFR: 50.86 mL/min — ABNORMAL LOW (ref >60.00)
Glucose, Bld: 98 mg/dL (ref 70–99)
Potassium: 3.7 mEq/L (ref 3.5–5.1)
SODIUM: 140 meq/L (ref 135–145)

## 2018-05-25 LAB — TSH: TSH: 4.33 u[IU]/mL (ref 0.35–4.50)

## 2018-05-25 MED ORDER — CYANOCOBALAMIN 1000 MCG/ML IJ SOLN
1000.0000 ug | Freq: Once | INTRAMUSCULAR | Status: AC
  Administered 2018-05-25: 1000 ug via INTRAMUSCULAR

## 2018-05-25 NOTE — Patient Instructions (Addendum)
  Hector [name] , Thank you for taking time to come for your Medicare Wellness Visit. I appreciate your ongoing commitment to your health goals. Please review the following plan we discussed and let me know if I can assist you in the future.   Keep all routine maintenance appointments.   Have a great day!  These are the goals we discussed: Goals    . Increase physical activity     Stay active and hydrated Healthy diet; portion control        This is a list of the screening recommended for you and due dates:  Health Maintenance  Topic Date Due  . Tetanus Vaccine  04/17/2024  . Flu Shot  Completed  . Pneumonia vaccines  Completed

## 2018-05-25 NOTE — Progress Notes (Signed)
Subjective:   Hector [name] is a 83 y.o. male who presents for Medicare Annual/Subsequent preventive examination.  Review of Systems:  No ROS.  Medicare Wellness Visit. Additional risk factors are reflected in the social history. Cardiac Risk Factors include: advanced age (>85men, >53 women);male gender;hypertension;obesity (BMI >30kg/m2)     Objective:    Vitals: BP 138/70 (BP Location: Left Arm, Patient Position: Sitting, Cuff Size: Normal)   Pulse 62   Temp 98.3 F (36.8 C) (Oral)   Resp 16   Ht 5\' 8"  (1.727 m)   Wt 245 lb 1.9 oz (111.2 kg)   SpO2 95%   BMI 37.27 kg/m   Body mass index is 37.27 kg/m.  Advanced Directives 05/25/2018 03/21/2018 10/27/2017 05/24/2017 08/04/2016 05/24/2016 05/13/2015  Does Patient Have a Medical Advance Directive? Yes Yes Yes Yes Yes Yes Yes  Type of Paramedic of [place];Living will [place];Living will Living will;Healthcare Power of [place];Living will [place];Living will [place];Living will [place];Living will  Does patient want to make changes to medical advance directive? No - Patient declined - - No - Patient declined - - No - Patient declined  Copy of [place] in Chart? Yes - - Yes No - copy requested Yes Yes    Tobacco Social History   Tobacco Use  Smoking Status Never Smoker  Smokeless Tobacco Never Used     Counseling given: Not Answered   Clinical Intake:  Pre-visit preparation completed: Yes  Pain : No/denies pain     Nutritional Status: BMI > 30  Obese Diabetes: No  How often do you need to have someone help you when you read instructions, pamphlets, or other written materials from your doctor or pharmacy?: 1 - Never  Interpreter Needed?: No     Past Medical History:  Diagnosis Date  . Anemia   . Arthritis   . Barrett esophagus   . Cancer ([place])    prostate,skin  . Chicken pox   . Diverticulitis   . Dysrhythmia   . GERD (gastroesophageal reflux disease)   . Hyperlipidemia   . Hypertension   . Hypothyroidism   . Melanoma ([place])    Malignant resection  . Sleep apnea   . Ulcer    Past Surgical History:  Procedure Laterality Date  . CARDIAC CATHETERIZATION    . Cataract Surgery Right 02/13/14  . COLON SURGERY  2006-2008-2011   polyps removed  . COLONOSCOPY W/ POLYPECTOMY    . COLONOSCOPY WITH PROPOFOL N/A 02/15/2018   Procedure: COLONOSCOPY WITH PROPOFOL;  Surgeon: Manya Silvas, MD;  Location: [facility] ENDOSCOPY;  Service: Endoscopy;  Laterality: N/A;  . cystocopy  2003  . ESOPHAGOGASTRODUODENOSCOPY (EGD) WITH PROPOFOL N/A 08/04/2016   Procedure: ESOPHAGOGASTRODUODENOSCOPY (EGD) WITH PROPOFOL;  Surgeon: Manya Silvas, MD;  Location: [facility] ENDOSCOPY;  Service: Endoscopy;  Laterality: N/A;  . HEMORRHOID SURGERY    . PROSTATE SURGERY    . sleep study     Family History  Problem Relation Age of Onset  . Liver disease Mother   . Heart disease Father   . Kidney disease Father    Social History   Socioeconomic History  . Marital status: Married    Spouse name: Not on file  . Number of children: Not on file  . Years of education: Not on file  . Highest education level: Not on file  Occupational History  . Not  on file  Social Needs  . Financial resource strain: Not hard at all  . Food insecurity:    Worry: Never true    Inability: Never true  . Transportation needs:    Medical: No    Non-medical: No  Tobacco Use  . Smoking status: Never Smoker  . Smokeless tobacco: Never Used  Substance and Sexual Activity  . Alcohol use: No    Alcohol/week: 0.0 standard drinks  . Drug use: No  . Sexual activity: Never  Lifestyle  . Physical activity:    Days per week: 0 days    Minutes per session: Not on file  . Stress: Not at all  Relationships  . Social connections:    Talks on phone: Not on file    Gets together: Not  on file    Attends religious service: Not on file    Active member of club or organization: Not on file    Attends meetings of clubs or organizations: Not on file    Relationship status: Married  Other Topics Concern  . Not on file  Social History Narrative  . Not on file    Outpatient Encounter Medications as of 05/25/2018  Medication Sig  . acetaminophen (TYLENOL) 500 MG tablet Take 500 mg by mouth every 6 (six) hours as needed.  Marland Kitchen amLODipine (NORVASC) 5 MG tablet Take 1 tablet (5 mg total) by mouth daily.  Marland Kitchen aspirin 81 MG tablet Take 81 mg by mouth daily.  . fluticasone (FLONASE) 50 MCG/ACT nasal spray TWO PUFFS IN EACH NOSTRIL ONCE A DAY  . hydrochlorothiazide (HYDRODIURIL) 25 MG tablet Take 1 tablet (25 mg total) by mouth daily.  . hyoscyamine (LEVSIN, ANASPAZ) 0.125 MG tablet Take 0.125 mg by mouth as needed.  . irbesartan (AVAPRO) 300 MG tablet Take 1 tablet (300 mg total) by mouth daily.  Marland Kitchen L-Lysine 500 MG TABS Take by mouth daily.  Marland Kitchen levothyroxine (SYNTHROID) 100 MCG tablet Take 1 tablet (100 mcg total) by mouth daily before breakfast.  . omeprazole (PRILOSEC) 20 MG capsule TAKE ONE CAPSULE BY MOUTH TWICE A DAY  . [START ON 07/24/2018] traMADol (ULTRAM) 50 MG tablet Take 1 tablet (50 mg total) by mouth every 8 (eight) hours as needed for severe pain.  . valACYclovir (VALTREX) 1000 MG tablet TAKE 1 TABLET BY MOUTH EVERY DAY  . Calcium Carb-Cholecalciferol (CALCIUM PLUS D3 ABSORBABLE) [phone] MG-UNIT CAPS Take 1 capsule by mouth daily with breakfast.  . [EXPIRED] cyanocobalamin ((VITAMIN B-12)) injection 1,000 mcg    No facility-administered encounter medications on file as of 05/25/2018.     Activities of Daily Living In your present state of health, do you have any difficulty performing the following activities: 05/25/2018  Hearing? N  Vision? N  Difficulty concentrating or making decisions? N  Walking or climbing stairs? N  Dressing or bathing? N  Doing errands,  shopping? N  Preparing Food and eating ? N  Using the Toilet? N  In the past six months, have you accidently leaked urine? Y  Comment Followed by pcp  Do you have problems with loss of bowel control? N  Managing your Medications? N  Managing your Finances? N  Housekeeping or managing your Housekeeping? N  Some recent data might be hidden    Patient Care Team: Einar Pheasant, MD as PCP - General (Internal Medicine) Einar Pheasant, MD (Internal Medicine)   Assessment:   This is a routine wellness examination for [place]ora. The goal of the wellness visit is to  assist the patient how to close the gaps in care and create a preventative care plan for the patient.   The roster of all physicians providing medical care to patient is listed in the Snapshot section of the chart.  No acute issues to be addressed by pcp today.   Osteoarthritis. Osteoporosis risk reviewed.    Safety issues reviewed; Smoke and carbon monoxide detectors in the home. Firearm safety discussed. Wears seatbelts when driving or riding with others. No violence in the home.  They do not have excessive sun exposure.  Discussed the need for sun protection: hats, long sleeves and the use of sunscreen if there is significant sun exposure.  Patient is alert, normal appearance, oriented to person/place/and time.  Correctly identified the president of the [place] and recalls of 3/3 words. Performs simple calculations and can read correct time from watch face.  Displays appropriate judgement.  No new identified risk were noted.  No failures at ADL's or IADL's.    BMI- discussed the importance of a healthy diet, water intake and the benefits of aerobic exercise. Educational material provided.   24 hour diet recall: Regular diet  Dental- dentures.   Eye- Wears corrective lenses.  Sleep patterns- Sleeps fair at night. CPAP not in use.  B12 injection administered R deltoid, tolerated well. No verbal complaint during or post  administration.   Labs completed.   Health maintenance gaps- closed.  Patient Concerns: None at this time. Follow up with PCP as needed.  Exercise Activities and Dietary recommendations Current Exercise Habits: The patient does not participate in regular exercise at present  Goals    . Increase physical activity     Stay active and hydrated Healthy diet; portion control        Fall Risk Fall Risk  05/25/2018 05/15/2018 04/18/2018 03/21/2018 10/27/2017  Falls in the past year? No No No No No  Number falls in past yr: - - - - -  Injury with Fall? - - - - -  Comment - - - - -  Follow up - - - - -   Depression Screen PHQ 2/9 Scores 05/25/2018 04/18/2018 03/21/2018 10/27/2017  PHQ - 2 Score 0 0 0 0  PHQ- 9 Score - - - -    Cognitive Function MMSE - Mini Mental State Exam 05/24/2016 05/13/2015  Orientation to time 5 5  Orientation to Place 5 5  Registration 3 3  Attention/ Calculation 5 5  Recall 3 3  Language- name 2 objects 2 2  Language- repeat 1 1  Language- follow 3 step command 3 3  Language- read & follow direction 1 1  Write a sentence 1 1  Copy design 1 1  Total score 30 30     6CIT Screen 05/25/2018 05/24/2017  What Year? 0 points 0 points  What month? 0 points 0 points  What time? 0 points 0 points  Count back from 20 0 points 0 points  Months in reverse 0 points 0 points  Repeat phrase 0 points 0 points  Total Score 0 0    Immunization History  Administered Date(s) Administered  . Influenza Split 05/03/2012, 05/01/2014  . Influenza Whole 05/09/2017  . Influenza, High Dose Seasonal PF 04/13/2016, 03/30/2018  . Influenza,inj,Quad PF,6+ Mos 05/13/2015  . Pneumococcal Conjugate-13 04/17/2014  . Pneumococcal Polysaccharide-23 05/24/2016  . Td 04/17/2014  . Zoster 05/02/2010   Screening Tests Health Maintenance  Topic Date Due  . TETANUS/TDAP  04/17/2024  . INFLUENZA VACCINE  Completed  . PNA vac Low Risk Adult  Completed      Plan:    End  of life planning; Advance aging; Advanced directives discussed. Copy of current HCPOA/Living Will on file.  I have personally reviewed and noted the following in the patient's chart:   . Medical and social history . Use of alcohol, tobacco or illicit drugs  . Current medications and supplements . Functional ability and status . Nutritional status . Physical activity . Advanced directives . List of other physicians . Hospitalizations, surgeries, and ER visits in previous 12 months . Vitals . Screenings to include cognitive, depression, and falls . Referrals and appointments  In addition, I have reviewed and discussed with patient certain preventive protocols, quality metrics, and best practice recommendations. A written personalized care plan for preventive services as well as general preventive health recommendations were provided to patient.     Varney Biles, LPN  [id number]   Reviewed above information.  Agree with assessment and plan.    Dr Nicki Reaper

## 2018-05-26 ENCOUNTER — Encounter: Payer: Self-pay | Admitting: Internal Medicine

## 2018-06-19 ENCOUNTER — Other Ambulatory Visit: Payer: Self-pay | Admitting: Internal Medicine

## 2018-06-27 ENCOUNTER — Ambulatory Visit (INDEPENDENT_AMBULATORY_CARE_PROVIDER_SITE_OTHER): Payer: Medicare Other | Admitting: *Deleted

## 2018-06-27 DIAGNOSIS — E538 Deficiency of other specified B group vitamins: Secondary | ICD-10-CM | POA: Diagnosis not present

## 2018-06-27 MED ORDER — CYANOCOBALAMIN 1000 MCG/ML IJ SOLN
1000.0000 ug | Freq: Once | INTRAMUSCULAR | Status: AC
  Administered 2018-06-27: 1000 ug via INTRAMUSCULAR

## 2018-06-27 NOTE — Progress Notes (Signed)
Patient presented for B 12 injection to left deltoid, patient voiced no concerns nor showed any signs of distress during injection. 

## 2018-08-01 ENCOUNTER — Other Ambulatory Visit: Payer: Self-pay | Admitting: Internal Medicine

## 2018-08-01 ENCOUNTER — Telehealth: Payer: Self-pay | Admitting: Radiology

## 2018-08-01 DIAGNOSIS — R739 Hyperglycemia, unspecified: Secondary | ICD-10-CM

## 2018-08-01 DIAGNOSIS — Z8546 Personal history of malignant neoplasm of prostate: Secondary | ICD-10-CM

## 2018-08-01 DIAGNOSIS — E039 Hypothyroidism, unspecified: Secondary | ICD-10-CM

## 2018-08-01 DIAGNOSIS — I10 Essential (primary) hypertension: Secondary | ICD-10-CM

## 2018-08-01 DIAGNOSIS — E7800 Pure hypercholesterolemia, unspecified: Secondary | ICD-10-CM

## 2018-08-01 NOTE — Telephone Encounter (Signed)
I have placed order for labs.  Pt coming in 08/03/18 for labs and has an appt with me 08/04/18.  Please notify him to ask his wife if she can come with him to his appt and will recheck her lab then - as we discussed.  Non fasting lab.

## 2018-08-01 NOTE — Telephone Encounter (Signed)
Pt is coming in for labs on Thursday please place future orders. Thank you.

## 2018-08-01 NOTE — Progress Notes (Signed)
Order placed for labs.

## 2018-08-03 ENCOUNTER — Other Ambulatory Visit (INDEPENDENT_AMBULATORY_CARE_PROVIDER_SITE_OTHER): Payer: Medicare Other

## 2018-08-03 DIAGNOSIS — E7800 Pure hypercholesterolemia, unspecified: Secondary | ICD-10-CM

## 2018-08-03 DIAGNOSIS — I10 Essential (primary) hypertension: Secondary | ICD-10-CM | POA: Diagnosis not present

## 2018-08-03 DIAGNOSIS — R739 Hyperglycemia, unspecified: Secondary | ICD-10-CM | POA: Diagnosis not present

## 2018-08-03 DIAGNOSIS — Z8546 Personal history of malignant neoplasm of prostate: Secondary | ICD-10-CM

## 2018-08-03 DIAGNOSIS — E039 Hypothyroidism, unspecified: Secondary | ICD-10-CM

## 2018-08-03 LAB — BASIC METABOLIC PANEL
BUN: 20 mg/dL (ref 6–23)
CALCIUM: 9.8 mg/dL (ref 8.4–10.5)
CO2: 31 mEq/L (ref 19–32)
Chloride: 102 mEq/L (ref 96–112)
Creatinine, Ser: 1.49 mg/dL (ref 0.40–1.50)
GFR: 48.09 mL/min — ABNORMAL LOW (ref >60.00)
Glucose, Bld: 107 mg/dL — ABNORMAL HIGH (ref 70–99)
Potassium: 4.5 mEq/L (ref 3.5–5.1)
SODIUM: 142 meq/L (ref 135–145)

## 2018-08-03 LAB — HEPATIC FUNCTION PANEL
ALT: 28 U/L (ref 0–53)
AST: 27 U/L (ref 0–37)
Albumin: 4.2 g/dL (ref 3.5–5.2)
Alkaline Phosphatase: 55 U/L (ref 39–117)
Bilirubin, Direct: 0.1 mg/dL (ref 0.0–0.3)
Total Bilirubin: 0.5 mg/dL (ref 0.2–1.2)
Total Protein: 6.9 g/dL (ref 6.0–8.3)

## 2018-08-03 LAB — LIPID PANEL
Cholesterol: 186 mg/dL (ref 0–200)
HDL: 54.7 mg/dL (ref >39.00)
NonHDL: 131.52
Total CHOL/HDL Ratio: 3
Triglycerides: 227 mg/dL — ABNORMAL HIGH (ref 0.0–149.0)
VLDL: 45.4 mg/dL — ABNORMAL HIGH (ref 0.0–40.0)

## 2018-08-03 LAB — TSH: TSH: 4.94 u[IU]/mL — ABNORMAL HIGH (ref 0.35–4.50)

## 2018-08-03 LAB — PSA, MEDICARE: PSA: 0.01 ng/mL — AB (ref 0.10–4.00)

## 2018-08-03 LAB — LDL CHOLESTEROL, DIRECT: Direct LDL: 101 mg/dL

## 2018-08-03 LAB — HEMOGLOBIN A1C: Hgb A1c MFr Bld: 5.6 % (ref 4.6–6.5)

## 2018-08-03 NOTE — Telephone Encounter (Signed)
Left message for patient and also attempted to call home phone twice.

## 2018-08-04 ENCOUNTER — Encounter: Payer: Self-pay | Admitting: Internal Medicine

## 2018-08-04 ENCOUNTER — Ambulatory Visit (INDEPENDENT_AMBULATORY_CARE_PROVIDER_SITE_OTHER): Payer: Medicare Other | Admitting: Internal Medicine

## 2018-08-04 VITALS — BP 134/78 | HR 65 | Temp 97.4°F | Resp 18 | Wt 251.0 lb

## 2018-08-04 DIAGNOSIS — E7800 Pure hypercholesterolemia, unspecified: Secondary | ICD-10-CM

## 2018-08-04 DIAGNOSIS — Z8546 Personal history of malignant neoplasm of prostate: Secondary | ICD-10-CM

## 2018-08-04 DIAGNOSIS — E538 Deficiency of other specified B group vitamins: Secondary | ICD-10-CM

## 2018-08-04 DIAGNOSIS — I10 Essential (primary) hypertension: Secondary | ICD-10-CM

## 2018-08-04 DIAGNOSIS — M47816 Spondylosis without myelopathy or radiculopathy, lumbar region: Secondary | ICD-10-CM

## 2018-08-04 DIAGNOSIS — K2270 Barrett's esophagus without dysplasia: Secondary | ICD-10-CM

## 2018-08-04 DIAGNOSIS — C439 Malignant melanoma of skin, unspecified: Secondary | ICD-10-CM

## 2018-08-04 DIAGNOSIS — E039 Hypothyroidism, unspecified: Secondary | ICD-10-CM

## 2018-08-04 DIAGNOSIS — E559 Vitamin D deficiency, unspecified: Secondary | ICD-10-CM

## 2018-08-04 MED ORDER — CYANOCOBALAMIN 1000 MCG/ML IJ SOLN
1000.0000 ug | Freq: Once | INTRAMUSCULAR | Status: AC
  Administered 2018-08-04: 1000 ug via INTRAMUSCULAR

## 2018-08-04 NOTE — Progress Notes (Signed)
Patient ID: ZEBULON GANTT, male   DOB: 02-Aug-1935, 84 y.o.   MRN: [id number]   Subjective:    Patient ID: ION GONNELLA, male    DOB: 09/10/1934, 84 y.o.   MRN: [id number]  HPI  Patient here for a scheduled follow up.  He reports he is doing relatively well.  Trying to stay active.  No chest pain.  Breathing stable.  No acid reflux.  No abdominal pain.  Bowels moving.  Not checking blood pressures recently.  States feels have been doing well.  Handling stress.  Still having back pain.  Injections help.  Desires no further intervention currently.     Past Medical History:  Diagnosis Date  . Anemia   . Arthritis   . Barrett esophagus   . Cancer ([place])    prostate,skin  . Chicken pox   . Diverticulitis   . Dysrhythmia   . GERD (gastroesophageal reflux disease)   . Hyperlipidemia   . Hypertension   . Hypothyroidism   . Melanoma ([place])    Malignant resection  . Sleep apnea   . Ulcer    Past Surgical History:  Procedure Laterality Date  . CARDIAC CATHETERIZATION    . Cataract Surgery Right 02/13/14  . COLON SURGERY  2006-2008-2011   polyps removed  . COLONOSCOPY W/ POLYPECTOMY    . COLONOSCOPY WITH PROPOFOL N/A 02/15/2018   Procedure: COLONOSCOPY WITH PROPOFOL;  Surgeon: Manya Silvas, MD;  Location[facility] ENDOSCOPY;  Service: Endoscopy;  Laterality: N/A;  . cystocopy  2003  . ESOPHAGOGASTRODUODENOSCOPY (EGD) WITH PROPOFOL N/A 08/04/2016   Procedure: ESOPHAGOGASTRODUODENOSCOPY (EGD) WITH PROPOFOL;  Surgeon: Manya Silvas, MD;  Location: [facility] ENDOSCOPY;  Service: Endoscopy;  Laterality: N/A;  . HEMORRHOID SURGERY    . PROSTATE SURGERY    . sleep study     Family History  Problem Relation Age of Onset  . Liver disease Mother   . Heart disease Father   . Kidney disease Father    Social History   Socioeconomic History  . Marital status: Married    Spouse name: Not on file  . Number of children: Not on file  . Years of education: Not on file  . Highest education level: Not  on file  Occupational History  . Not on file  Social Needs  . Financial resource strain: Not hard at all  . Food insecurity:    Worry: Never true    Inability: Never true  . Transportation needs:    Medical: No    Non-medical: No  Tobacco Use  . Smoking status: Never Smoker  . Smokeless tobacco: Never Used  Substance and Sexual Activity  . Alcohol use: No    Alcohol/week: 0.0 standard drinks  . Drug use: No  . Sexual activity: Never  Lifestyle  . Physical activity:    Days per week: 0 days    Minutes per session: Not on file  . Stress: Not at all  Relationships  . Social connections:    Talks on phone: Not on file    Gets together: Not on file    Attends religious service: Not on file    Active member of club or organization: Not on file    Attends meetings of clubs or organizations: Not on file    Relationship status: Married  Other Topics Concern  . Not on file  Social History Narrative  . Not on file    Outpatient Encounter Medications as of 08/04/2018  Medication Sig  .  acetaminophen (TYLENOL) 500 MG tablet Take 500 mg by mouth every 6 (six) hours as needed.  Marland Kitchen amLODipine (NORVASC) 5 MG tablet TAKE 1 TABLET BY MOUTH DAILY  . aspirin 81 MG tablet Take 81 mg by mouth daily.  . fluticasone (FLONASE) 50 MCG/ACT nasal spray TWO PUFFS IN EACH NOSTRIL ONCE A DAY  . hydrochlorothiazide (HYDRODIURIL) 25 MG tablet TAKE 1 TABLET BY MOUTH DAILY  . hyoscyamine (LEVSIN, ANASPAZ) 0.125 MG tablet Take 0.125 mg by mouth as needed.  . irbesartan (AVAPRO) 300 MG tablet TAKE 1 TABLET BY MOUTH DAILY  . L-Lysine 500 MG TABS Take by mouth daily.  Marland Kitchen levothyroxine (SYNTHROID, LEVOTHROID) 100 MCG tablet TAKE 1 TABLET BY MOUTH DAILY WITH BREAKFAST  . omeprazole (PRILOSEC) 20 MG capsule TAKE ONE CAPSULE BY MOUTH TWICE A DAY  . traMADol (ULTRAM) 50 MG tablet Take 1 tablet (50 mg total) by mouth every 8 (eight) hours as needed for severe pain.  . valACYclovir (VALTREX) 1000 MG tablet TAKE 1  TABLET BY MOUTH EVERY DAY  . Calcium Carb-Cholecalciferol (CALCIUM PLUS D3 ABSORBABLE) [phone] MG-UNIT CAPS Take 1 capsule by mouth daily with breakfast.  . [EXPIRED] cyanocobalamin ((VITAMIN B-12)) injection 1,000 mcg    No facility-administered encounter medications on file as of 08/04/2018.     Review of Systems  Constitutional: Negative for appetite change and unexpected weight change.  HENT: Negative for congestion and sinus pressure.   Respiratory: Negative for cough, chest tightness and shortness of breath.   Cardiovascular: Negative for chest pain, palpitations and leg swelling.  Gastrointestinal: Negative for abdominal pain, diarrhea, nausea and vomiting.  Genitourinary: Negative for difficulty urinating and dysuria.  Musculoskeletal: Negative for joint swelling and myalgias.  Skin: Negative for color change and rash.  Neurological: Negative for dizziness, light-headedness and headaches.  Psychiatric/Behavioral: Negative for agitation and dysphoric mood.       Objective:    Physical Exam Constitutional:      General: He is not in acute distress.    Appearance: Normal appearance. He is well-developed.  HENT:     Nose: Nose normal. No congestion.     Mouth/Throat:     Pharynx: No oropharyngeal exudate or posterior oropharyngeal erythema.  Neck:     Musculoskeletal: Neck supple. No muscular tenderness.  Cardiovascular:     Rate and Rhythm: Normal rate and regular rhythm.  Pulmonary:     Effort: Pulmonary effort is normal. No respiratory distress.     Breath sounds: Normal breath sounds.  Abdominal:     General: Bowel sounds are normal.     Palpations: Abdomen is soft.     Tenderness: There is no abdominal tenderness.  Musculoskeletal:        General: No swelling or tenderness.  Skin:    Findings: No erythema or rash.  Neurological:     Mental Status: He is alert.  Psychiatric:        Mood and Affect: Mood normal.        Behavior: Behavior normal.     BP  134/78 (BP Location: Left Arm, Patient Position: Sitting, Cuff Size: Large)   Pulse 65   Temp (!) 97.4 F (36.3 C) (Oral)   Resp 18   Wt 251 lb (113.9 kg)   SpO2 96%   BMI 38.16 kg/m  Wt Readings from Last 3 Encounters:  08/04/18 251 lb (113.9 kg)  05/25/18 245 lb 1.9 oz (111.2 kg)  05/15/18 245 lb (111.1 kg)     Lab Results  Component  Value Date   WBC 6.8 11/14/2017   HGB 15.3 11/14/2017   HCT 45.3 11/14/2017   PLT 226.0 11/14/2017   GLUCOSE 107 (H) 08/03/2018   CHOL 186 08/03/2018   TRIG 227.0 (H) 08/03/2018   HDL 54.70 08/03/2018   LDLDIRECT 101.0 08/03/2018   LDLCALC 97 03/28/2018   ALT 28 08/03/2018   AST 27 08/03/2018   NA 142 08/03/2018   K 4.5 08/03/2018   CL 102 08/03/2018   CREATININE 1.49 08/03/2018   BUN 20 08/03/2018   CO2 31 08/03/2018   TSH 4.94 (H) 08/03/2018   PSA 0.01 (L) 08/03/2018   HGBA1C 5.6 08/03/2018    Dg C-arm 1-60 Min-no Report  Result Date: 04/18/2018 Fluoroscopy was utilized by the requesting physician.  No radiographic interpretation.       Assessment & Plan:   Problem List Items Addressed This Visit    B12 deficiency - Primary    Continue B12 injections.        Relevant Medications   cyanocobalamin ((VITAMIN B-12)) injection 1,000 mcg (Completed)   Barrett's esophagus    Followed by GI.  Last EGD 08/2016.  Upper symptoms controlled.        History of prostate cancer    S/p prostatectomy.  Recent PSA <.01.        Hypercholesterolemia    Not on cholesterol medication.  Low cholesterol diet and exercise.  Follow lipid panel.        Hypertension    Blood pressure as outlined.  Follow pressures.  Follow metabolic panel.  Same medications.        Relevant Orders   Basic metabolic panel   Hypothyroid    On thyroid replacement.  Follow tsh.  Recent check slightly increased.  Takes with his other medications. Discussed taking by itself and waiting on hour before eating or taking other medications.  Hold on adjusting  medication.  Follow tsh.        Relevant Orders   TSH   Malignant melanoma ([place])    Followed by dermatology.        Osteoarthritis of lumbar spine (Chronic)    Followed by pain clinic.  Persistent pain.        Vitamin D deficiency    Follow vitamin D level.            Einar Pheasant, MD

## 2018-08-06 ENCOUNTER — Encounter: Payer: Self-pay | Admitting: Internal Medicine

## 2018-08-06 NOTE — Assessment & Plan Note (Signed)
Follow vitamin D level.  

## 2018-08-06 NOTE — Assessment & Plan Note (Addendum)
On thyroid replacement.  Follow tsh.  Recent check slightly increased.  Takes with his other medications. Discussed taking by itself and waiting on hour before eating or taking other medications.  Hold on adjusting medication.  Follow tsh.

## 2018-08-06 NOTE — Assessment & Plan Note (Signed)
Followed by GI.  Last EGD 08/2016.  Upper symptoms controlled.   

## 2018-08-06 NOTE — Assessment & Plan Note (Signed)
S/p prostatectomy.  Recent PSA <.01.

## 2018-08-06 NOTE — Assessment & Plan Note (Signed)
Blood pressure as outlined.  Follow pressures.  Follow metabolic panel.  Same medications.

## 2018-08-06 NOTE — Assessment & Plan Note (Signed)
Not on cholesterol medication.  Low cholesterol diet and exercise.  Follow lipid panel.   

## 2018-08-06 NOTE — Assessment & Plan Note (Signed)
Followed by pain clinic.  Persistent pain.

## 2018-08-06 NOTE — Assessment & Plan Note (Signed)
Continue B12 injections.   

## 2018-08-06 NOTE — Assessment & Plan Note (Signed)
Followed by dermatology

## 2018-09-05 ENCOUNTER — Ambulatory Visit (INDEPENDENT_AMBULATORY_CARE_PROVIDER_SITE_OTHER): Payer: Medicare Other

## 2018-09-05 DIAGNOSIS — E538 Deficiency of other specified B group vitamins: Secondary | ICD-10-CM

## 2018-09-05 MED ORDER — CYANOCOBALAMIN 1000 MCG/ML IJ SOLN
1000.0000 ug | Freq: Once | INTRAMUSCULAR | Status: AC
  Administered 2018-09-05: 1000 ug via INTRAMUSCULAR

## 2018-09-05 NOTE — Progress Notes (Addendum)
Patient presented today for b12 injection. Right arm. Patient voiced no concerns nor showed any signs of distress during injection.   Reviewed.  Dr Nicki Reaper

## 2018-09-18 ENCOUNTER — Other Ambulatory Visit: Payer: Self-pay | Admitting: Internal Medicine

## 2018-10-04 ENCOUNTER — Ambulatory Visit (INDEPENDENT_AMBULATORY_CARE_PROVIDER_SITE_OTHER): Payer: Medicare Other | Admitting: Lab

## 2018-10-04 DIAGNOSIS — E538 Deficiency of other specified B group vitamins: Secondary | ICD-10-CM | POA: Diagnosis not present

## 2018-10-04 MED ORDER — CYANOCOBALAMIN 1000 MCG/ML IJ SOLN
1000.0000 ug | Freq: Once | INTRAMUSCULAR | Status: AC
  Administered 2018-10-04: 1000 ug via INTRAMUSCULAR

## 2018-10-04 NOTE — Progress Notes (Addendum)
Pt presented today for nurse visit for b12 innjection, pt tolerated well.  Gae Bon, [place]  Reviewed.  Dr Nicki Reaper

## 2018-10-09 ENCOUNTER — Other Ambulatory Visit: Payer: Self-pay | Admitting: Internal Medicine

## 2018-10-10 ENCOUNTER — Other Ambulatory Visit (INDEPENDENT_AMBULATORY_CARE_PROVIDER_SITE_OTHER): Payer: Medicare Other

## 2018-10-10 DIAGNOSIS — E039 Hypothyroidism, unspecified: Secondary | ICD-10-CM | POA: Diagnosis not present

## 2018-10-10 DIAGNOSIS — I10 Essential (primary) hypertension: Secondary | ICD-10-CM

## 2018-10-10 LAB — BASIC METABOLIC PANEL
BUN: 23 mg/dL (ref 6–23)
CO2: 30 mEq/L (ref 19–32)
Calcium: 9.9 mg/dL (ref 8.4–10.5)
Chloride: 102 mEq/L (ref 96–112)
Creatinine, Ser: 1.42 mg/dL (ref 0.40–1.50)
GFR: 47.81 mL/min — ABNORMAL LOW (ref >60.00)
Glucose, Bld: 121 mg/dL — ABNORMAL HIGH (ref 70–99)
Potassium: 3.8 mEq/L (ref 3.5–5.1)
Sodium: 142 mEq/L (ref 135–145)

## 2018-10-10 LAB — TSH: TSH: 2.3 u[IU]/mL (ref 0.35–4.50)

## 2018-10-11 ENCOUNTER — Encounter: Payer: Self-pay | Admitting: Internal Medicine

## 2018-11-02 ENCOUNTER — Other Ambulatory Visit: Payer: Self-pay | Admitting: Nurse Practitioner

## 2018-11-02 DIAGNOSIS — G894 Chronic pain syndrome: Secondary | ICD-10-CM

## 2018-11-07 ENCOUNTER — Ambulatory Visit: Payer: Medicare Other

## 2018-11-14 ENCOUNTER — Ambulatory Visit: Payer: Medicare Other | Attending: Nurse Practitioner | Admitting: Nurse Practitioner

## 2018-11-14 ENCOUNTER — Other Ambulatory Visit: Payer: Self-pay

## 2018-11-14 DIAGNOSIS — M7918 Myalgia, other site: Secondary | ICD-10-CM | POA: Diagnosis not present

## 2018-11-14 DIAGNOSIS — G8929 Other chronic pain: Secondary | ICD-10-CM

## 2018-11-14 DIAGNOSIS — M47816 Spondylosis without myelopathy or radiculopathy, lumbar region: Secondary | ICD-10-CM | POA: Diagnosis not present

## 2018-11-14 DIAGNOSIS — G894 Chronic pain syndrome: Secondary | ICD-10-CM

## 2018-11-14 MED ORDER — TRAMADOL HCL 50 MG PO TABS: 50.0000 mg | ORAL_TABLET | Freq: Three times a day (TID) | ORAL | 5 refills | Status: DC | PRN

## 2018-11-14 NOTE — Progress Notes (Signed)
Pain Management Encounter Note - Virtual Visit via Telephone Telehealth (real-time audio visits between healthcare provider and patient).  Patient's Phone No. & Preferred Pharmacy:  [phone] (home); [phone] (mobile); (Preferred) [place], [place] - [address] [place] [address] Phone: [phone] Fax: [phone]   Pre-screening note:  Our staff contacted Mr. Counts and offered him an "in person", "face-to-face" appointment versus a telephone encounter. He indicated preferring the telephone encounter, at this time.  Reason for Virtual Visit: COVID-19*  Social distancing based on CDC and AMA recommendations.   I contacted GERRICK RAY on 11/14/2018 at 8:46 AM by telephone and clearly identified myself as Dionisio David, NP. I verified that I was speaking with the correct person using two identifiers (Name and date of birth: 03-30-1935).  Advanced Informed Consent I sought verbal advanced consent from [name] for telemedicine interactions and virtual visit. I informed Mr. Benner of the security and privacy concerns, risks, and limitations associated with performing an evaluation and management service by telephone. I also informed Mr. Cohron of the availability of "in person" appointments and I informed him of the possibility of a patient responsible charge related to this service. Mr. Blankenbaker expressed understanding and agreed to proceed.   Historic Elements   Mr. ALEJO BEAMER is a 84 y.o. year old, male patient evaluated today after his last encounter by our practice on 11/02/2018. Mr. Heckmann  has a past medical history of Anemia, Arthritis, Barrett esophagus, Cancer ([place]), Chicken pox, Diverticulitis, Dysrhythmia, GERD (gastroesophageal reflux disease), Hyperlipidemia, Hypertension, Hypothyroidism, Melanoma ([place]), Sleep apnea, and Ulcer. He also  has a past surgical history that includes Prostate surgery; cystocopy (2003); Hemorrhoid  surgery; Cardiac catheterization; sleep study; Colon surgery (2006-2008-2011); Cataract Surgery (Right, 02/13/14); Esophagogastroduodenoscopy (egd) with propofol (N/A, 08/04/2016); Colonoscopy w/ polypectomy; and Colonoscopy with propofol (N/A, 02/15/2018). Mr. Diesel has a current medication list which includes the following prescription(s): acetaminophen, amlodipine, aspirin, calcium plus d3 absorbable, fluticasone, hydrochlorothiazide, hyoscyamine, irbesartan, l-lysine, levothyroxine, omeprazole, tramadol, and valacyclovir. He  reports that he has never smoked. He has never used smokeless tobacco. He reports that he does not drink alcohol or use drugs. Mr. Mcnutt is allergic to tape.   HPI  I last saw him on 11/02/2018. He is being evaluated for medication management. He is having 5/10 low back pain. He denies any leg pain. He has been out of his Tramadol. He was going to call but decided to wait. He has been resting and this helps to ease his pain.  He admits that he lumbar facet nerve block was effective for his pain. He is not sure if the  RFA was as effective and the nerve block. He denies any side effects of his medication.   Pharmacotherapy Assessment  Analgesic: Tramadol 50 mg 1 tablet every 8 hours (tramadol 150 mg/day) MME/day: 150 mg/day.   Monitoring: Pharmacotherapy: No side-effects or adverse reactions reported. [place] PMP: PDMP not reviewed this encounter.       Compliance: No problems identified. Plan: Refer to "POC".  Review of recent tests  DG C-Arm 1-60 Min-No Report Fluoroscopy was utilized by the requesting physician.  No radiographic  interpretation.    Lab on 10/10/2018  Component Date Value Ref Range Status  . Sodium 10/10/2018 142  135 - 145 mEq/L Final  . Potassium 10/10/2018 3.8  3.5 - 5.1 mEq/L Final  . Chloride 10/10/2018 102  96 - 112 mEq/L Final  . CO2 10/10/2018 30  19 - 32 mEq/L Final  . Glucose, Bld 10/10/2018 121* 70 - 99 mg/dL Final  . BUN 10/10/2018 23  6 - 23  mg/dL Final  . Creatinine, Ser 10/10/2018 1.42  0.40 - 1.50 mg/dL Final  . Calcium 10/10/2018 9.9  8.4 - 10.5 mg/dL Final  . GFR 10/10/2018 47.81* >60.00 mL/min Final  . TSH 10/10/2018 2.30  0.35 - 4.50 uIU/mL Final   Assessment  The primary encounter diagnosis was Lumbar spondylosis. Diagnoses of Lumbar facet arthropathy (Bilateral), Chronic musculoskeletal pain, and Chronic pain syndrome were also pertinent to this visit.  Plan of Care  I am having Aimee A. Barcus maintain his aspirin, hyoscyamine, acetaminophen, L-Lysine, fluticasone, Calcium Plus D3 Absorbable, hydrochlorothiazide, irbesartan, amLODipine, valACYclovir, levothyroxine, omeprazole, and traMADol.  Pharmacotherapy (Medications Ordered): Meds ordered this encounter  Medications  . traMADol (ULTRAM) 50 MG tablet    Sig: Take 1 tablet (50 mg total) by mouth every 8 (eight) hours as needed for severe pain.    Dispense:  90 tablet    Refill:  5    Do not place this medication, or any other prescription from our practice, on "Automatic Refill". Patient may have prescription filled one day early if pharmacy is closed on scheduled refill date.    Order Specific Question:   Supervising Provider    Answer:   Milinda Pointer [phone]   Orders:  No orders of the defined types were placed in this encounter.  Follow-up plan:   Return in about 6 months (around 05/16/2019) for MedMgmt.   I discussed the assessment and treatment plan with the patient. The patient was provided an opportunity to ask questions and all were answered. The patient agreed with the plan and demonstrated an understanding of the instructions.  Patient advised to call back or seek an in-person evaluation if the symptoms or condition worsens.  Total duration of non-face-to-face encounter: 48minutes.  Note by: Dionisio David, NP  Disclaimer:  * Given the special circumstances of the COVID-19 pandemic, the federal government has announced that the Office for  Civil Rights (OCR) will exercise its enforcement discretion and will not impose penalties on physicians using telehealth in the event of noncompliance with regulatory requirements under the [place] (HIPAA) in connection with the good faith provision of telehealth during the TKPTW-65 national public health emergency. ([place])

## 2018-11-22 ENCOUNTER — Other Ambulatory Visit: Payer: Self-pay | Admitting: Internal Medicine

## 2018-12-06 ENCOUNTER — Other Ambulatory Visit: Payer: Self-pay

## 2018-12-06 ENCOUNTER — Ambulatory Visit (INDEPENDENT_AMBULATORY_CARE_PROVIDER_SITE_OTHER): Payer: Medicare Other | Admitting: Internal Medicine

## 2018-12-06 ENCOUNTER — Encounter: Payer: Self-pay | Admitting: Internal Medicine

## 2018-12-06 DIAGNOSIS — E538 Deficiency of other specified B group vitamins: Secondary | ICD-10-CM

## 2018-12-06 DIAGNOSIS — C439 Malignant melanoma of skin, unspecified: Secondary | ICD-10-CM

## 2018-12-06 DIAGNOSIS — E7800 Pure hypercholesterolemia, unspecified: Secondary | ICD-10-CM

## 2018-12-06 DIAGNOSIS — E039 Hypothyroidism, unspecified: Secondary | ICD-10-CM | POA: Diagnosis not present

## 2018-12-06 DIAGNOSIS — K2270 Barrett's esophagus without dysplasia: Secondary | ICD-10-CM

## 2018-12-06 DIAGNOSIS — E559 Vitamin D deficiency, unspecified: Secondary | ICD-10-CM

## 2018-12-06 DIAGNOSIS — I10 Essential (primary) hypertension: Secondary | ICD-10-CM | POA: Diagnosis not present

## 2018-12-06 DIAGNOSIS — M47816 Spondylosis without myelopathy or radiculopathy, lumbar region: Secondary | ICD-10-CM

## 2018-12-06 NOTE — Progress Notes (Signed)
[name], male   DOB: 1934/11/24, 84 y.o.   MRN: [id number]   Virtual Visit via Telephone Note  This visit type was conducted due to national recommendations for restrictions regarding the COVID-19 pandemic (e.g. social distancing).  This format is felt to be most appropriate for this patient at this time.  All issues noted in this document were discussed and addressed.  No physical exam was performed (except for noted visual exam findings with Video Visits).   I connected with [name] by telephone and verified that I am speaking with the correct person using two identifiers. Location patient: home Location provider: work Persons participating in the virtual visit: patient, provider  I discussed the limitations, risks, security and privacy concerns of performing an evaluation and management service by telephone and the availability of in person appointments.  The patient expressed understanding and agreed to proceed.   Reason for visit: scheduled follow up.  HPI: He reports he is doing relatively well.  Being followed at pain clinic for persistent back pain.  Last evaluated 11/14/18.  Previous facet nerve block helped.  Takes tramadol.  Denies chest pain.  Breathing stable.  No acid reflux.  No increased abdominal pain.  Bowels moving.  Blood pressures averaging 130's/60-70s.  Trying to stay in due to COVID restrictions.  No known COVID exposure. No fever.  No chest congestion, cough or increased sob.     ROS: See pertinent positives and negatives per HPI.  Past Medical History:  Diagnosis Date   Anemia    Arthritis    Barrett esophagus    Cancer ([place])    prostate,skin   Chicken pox    Diverticulitis    Dysrhythmia    GERD (gastroesophageal reflux disease)    Hyperlipidemia    Hypertension    Hypothyroidism    Melanoma ([place])    Malignant resection   Sleep apnea    Ulcer     Past Surgical History:  Procedure Laterality Date   CARDIAC  CATHETERIZATION     Cataract Surgery Right 02/13/14   COLON SURGERY  2006-2008-2011   polyps removed   COLONOSCOPY W/ POLYPECTOMY     COLONOSCOPY WITH PROPOFOL N/A 02/15/2018   Procedure: COLONOSCOPY WITH PROPOFOL;  Surgeon: Manya Silvas, MD;  Location: [facility] ENDOSCOPY;  Service: Endoscopy;  Laterality: N/A;   cystocopy  2003   ESOPHAGOGASTRODUODENOSCOPY (EGD) WITH PROPOFOL N/A 08/04/2016   Procedure: ESOPHAGOGASTRODUODENOSCOPY (EGD) WITH PROPOFOL;  Surgeon: Manya Silvas, MD;  Location: [facility] ENDOSCOPY;  Service: Endoscopy;  Laterality: N/A;   HEMORRHOID SURGERY     PROSTATE SURGERY     sleep study      Family History  Problem Relation Age of Onset   Liver disease Mother    Heart disease Father    Kidney disease Father     SOCIAL HX: reviewed.    Current Outpatient Medications:    acetaminophen (TYLENOL) 500 MG tablet, Take 500 mg by mouth every 6 (six) hours as needed., Disp: , Rfl:    amLODipine (NORVASC) 5 MG tablet, TAKE 1 TABLET BY MOUTH DAILY, Disp: 30 tablet, Rfl: 5   aspirin 81 MG tablet, Take 81 mg by mouth daily., Disp: , Rfl:    Calcium Carb-Cholecalciferol (CALCIUM PLUS D3 ABSORBABLE) [phone] MG-UNIT CAPS, Take 1 capsule by mouth daily with breakfast., Disp: 90 capsule, Rfl: 0   fluticasone (FLONASE) 50 MCG/ACT nasal spray, TWO PUFFS IN EACH NOSTRIL ONCE A DAY, Disp: 16 g, Rfl: 4  hydrochlorothiazide (HYDRODIURIL) 25 MG tablet, TAKE 1 TABLET BY MOUTH DAILY, Disp: 30 tablet, Rfl: 5   hyoscyamine (LEVSIN, ANASPAZ) 0.125 MG tablet, Take 0.125 mg by mouth as needed., Disp: , Rfl:    irbesartan (AVAPRO) 300 MG tablet, TAKE 1 TABLET BY MOUTH DAILY, Disp: 30 tablet, Rfl: 5   L-Lysine 500 MG TABS, Take by mouth daily., Disp: , Rfl:    levothyroxine (SYNTHROID, LEVOTHROID) 100 MCG tablet, TAKE 1 TABLET BY MOUTH DAILY WITH BREAKFAST, Disp: 30 tablet, Rfl: 3   omeprazole (PRILOSEC) 20 MG capsule, TAKE ONE CAPSULE BY MOUTH TWICE A DAY, Disp: 60 capsule,  Rfl: 3   traMADol (ULTRAM) 50 MG tablet, Take 1 tablet (50 mg total) by mouth every 8 (eight) hours as needed for severe pain., Disp: 90 tablet, Rfl: 5   valACYclovir (VALTREX) 1000 MG tablet, TAKE 1 TABLET BY MOUTH DAILY, Disp: 30 tablet, Rfl: 3  EXAM:  VITALS per patient if applicable: 798/92, pulse 60  GENERAL: alert. Answering questions appropriately.  Sounds to be in no acute distress.    PSYCH/NEURO: pleasant and cooperative, no obvious depression or anxiety, speech and thought processing grossly intact  ASSESSMENT AND PLAN:  Discussed the following assessment and plan:  B12 deficiency  Barrett's esophagus without dysplasia  Hypercholesterolemia  Essential hypertension  Hypothyroidism, unspecified type  Malignant melanoma, unspecified site (HCC)  Osteoarthritis of lumbar spine  Vitamin D deficiency  B12 deficiency Continue B12 injections.    Barrett's esophagus Followed by GI.  Last EGD 08/2016.  Upper symptoms controlled.    Hypercholesterolemia Low cholesterol diet and exercise.  Follow lipid panel.    Hypertension Blood pressure has been doing well as outlined.  Low cholesterol diet and exercise.  Follow lipid panel.    Hypothyroid On thyroid replacement. Follow tsh.   Malignant melanoma ([place]) Followed by dermatology.   Osteoarthritis of lumbar spine Followed by pain clinic.  Taking tramadol.    Vitamin D deficiency Follow vitamin D level.     I discussed the assessment and treatment plan with the patient. The patient was provided an opportunity to ask questions and all were answered. The patient agreed with the plan and demonstrated an understanding of the instructions.   The patient was advised to call back or seek an in-person evaluation if the symptoms worsen or if the condition fails to improve as anticipated.  I provided 15 minutes of non-face-to-face time during this encounter.   Einar Pheasant, MD

## 2018-12-10 ENCOUNTER — Encounter: Payer: Self-pay | Admitting: Internal Medicine

## 2018-12-10 NOTE — Assessment & Plan Note (Signed)
Follow vitamin D level.  

## 2018-12-10 NOTE — Assessment & Plan Note (Signed)
Followed by pain clinic.  Taking tramadol.

## 2018-12-10 NOTE — Assessment & Plan Note (Signed)
On thyroid replacement.  Follow tsh.  

## 2018-12-10 NOTE — Assessment & Plan Note (Signed)
Followed by GI.  Last EGD 08/2016.  Upper symptoms controlled.

## 2018-12-10 NOTE — Assessment & Plan Note (Signed)
Continue B12 injections.   

## 2018-12-10 NOTE — Assessment & Plan Note (Signed)
Blood pressure has been doing well as outlined.  Low cholesterol diet and exercise.  Follow lipid panel.

## 2018-12-10 NOTE — Assessment & Plan Note (Signed)
Low cholesterol diet and exercise.  Follow lipid panel.   

## 2018-12-10 NOTE — Assessment & Plan Note (Signed)
Followed by dermatology

## 2019-01-03 ENCOUNTER — Other Ambulatory Visit: Payer: Self-pay | Admitting: Internal Medicine

## 2019-02-01 ENCOUNTER — Other Ambulatory Visit: Payer: Self-pay | Admitting: Internal Medicine

## 2019-04-25 ENCOUNTER — Ambulatory Visit (INDEPENDENT_AMBULATORY_CARE_PROVIDER_SITE_OTHER): Payer: Medicare Other

## 2019-04-25 ENCOUNTER — Other Ambulatory Visit: Payer: Self-pay

## 2019-04-25 DIAGNOSIS — Z23 Encounter for immunization: Secondary | ICD-10-CM | POA: Diagnosis not present

## 2019-05-15 ENCOUNTER — Encounter: Payer: Self-pay | Admitting: Pain Medicine

## 2019-05-15 NOTE — Progress Notes (Signed)
Pain Management Virtual Encounter Note - Virtual Visit via Telephone Telehealth (real-time audio visits between healthcare provider and patient).   Patient's Phone No. & Preferred Pharmacy:  [phone] (home); [phone] (mobile); (Preferred) [phone] [email or web address]  [facility] [place], [place] - [address] [place] [employer] [place] [address] Phone: [phone] Fax: [phone]    Pre-screening note:  Our staff contacted Mr. Pundt and offered him an "in person", "face-to-face" appointment versus a telephone encounter. He indicated preferring the telephone encounter, at this time.   Reason for Virtual Visit: COVID-19*  Social distancing based on CDC and AMA recommendations.   I contacted Hector [name] on 05/16/2019 via telephone.      I clearly identified myself as Gaspar Cola, MD. I verified that I was speaking with the correct person using two identifiers (Name: Hector [name], and date of birth: [birth year]).  Advanced Informed Consent I sought verbal advanced consent from Hector [name] for virtual visit interactions. I informed Hector [name] of possible security and privacy concerns, risks, and limitations associated with providing "not-in-person" medical evaluation and management services. I also informed Hector [name] of the availability of "in-person" appointments. Finally, I informed him that there would be a charge for the virtual visit and that he could be  personally, fully or partially, financially responsible for it. Hector [name] expressed understanding and agreed to proceed.   Historic Elements   Hector [name] is a 84 y.o. year old, male patient evaluated today after his last encounter by our practice on Visit date not found. Hector [name]  has a past medical history of Acute postoperative pain (03/21/2018), Anemia, Arthritis, Barrett esophagus, Cancer ([place]), Chicken pox, Diverticulitis, Dysrhythmia, GERD (gastroesophageal reflux disease),  Hyperlipidemia, Hypertension, Hypothyroidism, Melanoma ([place]), Sleep apnea, and Ulcer. He also  has a past surgical history that includes Prostate surgery; cystocopy (2003); Hemorrhoid surgery; Cardiac catheterization; sleep study; Colon surgery (2006-2008-2011); Cataract Surgery (Right, 02/13/14); Esophagogastroduodenoscopy (egd) with propofol (N/A, 08/04/2016); Colonoscopy w/ polypectomy; and Colonoscopy with propofol (N/A, 02/15/2018). Hector [name] has a current medication list which includes the following prescription(s): acetaminophen, amlodipine, aspirin, calcium plus d3 absorbable, hydrochlorothiazide, irbesartan, l-lysine, levothyroxine, omeprazole, valacyclovir, fluticasone, hyoscyamine, and tramadol. He  reports that he has never smoked. He has never used smokeless tobacco. He reports that he does not drink alcohol or use drugs. Hector [name] is allergic to tape.   HPI  Today, he is being contacted for medication management.  The patient requested this appointment for follow-up evaluation of his low back pain.  He indicates having some pain that is located approximately 2 to 3 inches above his tailbone and the pain in this area has been increasing exponentially.  He would like to come in for a "shot".  Based on the description of the patient's problem, I believe that he would benefit from a caudal epidural steroid injection under fluoroscopic guidance and IV sedation.  We will be scheduling him as soon as possible for this.  Pharmacotherapy Assessment  Analgesic: Tramadol 50 mg, 1 tab PO q 8 hrs (150 mg/day of tramadol) MME/day:15mg /day.   Monitoring: Pharmacotherapy: No side-effects or adverse reactions reported. [place] PMP: PDMP reviewed during this encounter.       Compliance: No problems identified. Effectiveness: Clinically acceptable. Plan: Refer to "POC".  UDS: No results found for: SUMMARY Laboratory Chemistry Profile (12 mo)  Renal: 10/10/2018: BUN 23; Creatinine, Ser 1.42  Lab Results   Component Value Date   GFR 47.81 (L) 10/10/2018   GFRAA >  60 01/23/2018   GFRNONAA 56 (L) 01/23/2018   Hepatic: 08/03/2018: Albumin 4.2 Lab Results  Component Value Date   AST 27 08/03/2018   ALT 28 08/03/2018   Other: No results found for requested labs within last 8760 hours. Note: Above Lab results reviewed.  Imaging  Last 90 days:  No results found.  Assessment  The primary encounter diagnosis was Chronic pain syndrome. Diagnoses of Chronic low back pain (Primary Area of Pain) (Bilateral) (L>R), Lumbosacral Grade 1 Anterolisthesis of L5 over S1, Lumbar foraminal stenosis (L5-S1) (Bilateral) (L>R), Chronic lower extremity pain (Secondary Area of Pain) (Left), Chronic lower extremity radicular pain (L5) (Left), Chronic Lumbar radiculitis (L5) (Left), Lumbar facet syndrome (Bilateral) (L>R), L5-S1 bilateral pars defect with spondylolisthesis, and Vitamin D deficiency were also pertinent to this visit.  Plan of Care  I am having Hector [name] maintain his aspirin, hyoscyamine, acetaminophen, L-Lysine, fluticasone, amLODipine, irbesartan, hydrochlorothiazide, valACYclovir, levothyroxine, omeprazole, Calcium Plus D3 Absorbable, and traMADol.  Pharmacotherapy (Medications Ordered): Meds ordered this encounter  Medications  . Calcium Carb-Cholecalciferol (CALCIUM PLUS D3 ABSORBABLE) [id number] MG-UNIT CAPS    Sig: Take 1 capsule by mouth daily with breakfast.    Dispense:  90 capsule    Refill:  3    Do not place medication on "Automatic Refill". Fill one day early if pharmacy is closed on scheduled refill date.  . traMADol (ULTRAM) 50 MG tablet    Sig: Take 1 tablet (50 mg total) by mouth every 8 (eight) hours as needed for severe pain.    Dispense:  90 tablet    Refill:  5    Chronic Pain: STOP Act (Not applicable) Fill 1 day early if closed on refill date. Do not fill until: 05/16/2019. To last until: 11/12/2019. Avoid benzodiazepines within 8 hours of opioids   Orders:  Orders  Placed This Encounter  Procedures  . Caudal Epidural Injection    Standing Status:   Future    Standing Expiration Date:   06/16/2019    Scheduling Instructions:     Laterality: Midline     Level(s): Sacrococcygeal canal (Tailbone area)     Sedation: Patient's choice     Scheduling Timeframe: As soon as pre-approved    Order Specific Question:   Where will this procedure be performed?    Answer:   [facility]   Follow-up plan:   Return for Procedure (w/ sedation): (ML) Caudal ESI, (ASAP).      Interventional management options: Planned, scheduled, and/or pending:   Therapeutic left-sided lumbar facet RFA under fluoroscopic guidance and IV sedation (PRN)   Considering:   Diagnostic left L5 TFESI  Possible left L5 nerve root ganglion RFA Diagnosticleft L4-5 LESI   Palliative PRN treatment(s):   Palliativebilateral lumbar facet block #3 Palliative right-sided lumbar facet RFA #2 (last done 04/18/2018)  Palliative left-sided lumbar facet RFA #2 (last done 03/21/2018)     Recent Visits No visits were found meeting these conditions.  Showing recent visits within past 90 days and meeting all other requirements   Today's Visits Date Type Provider Dept  05/16/19 Office Visit Milinda Pointer, MD [facility]  Showing today's visits and meeting all other requirements   Future Appointments No visits were found meeting these conditions.  Showing future appointments within next 90 days and meeting all other requirements   I discussed the assessment and treatment plan with the patient. The patient was provided an opportunity to ask questions and all were answered. The patient agreed  with the plan and demonstrated an understanding of the instructions.  Patient advised to call back or seek an in-person evaluation if the symptoms or condition worsens.  Total duration of non-face-to-face encounter: 13 minutes.  Note by: Gaspar Cola, MD Date: 05/16/2019;  Time: 11:27 AM  Note: This dictation was prepared with Dragon dictation. Any transcriptional errors that may result from this process are unintentional.  Disclaimer:  * Given the special circumstances of the COVID-19 pandemic, the federal government has announced that the Office for Civil Rights (OCR) will exercise its enforcement discretion and will not impose penalties on physicians using telehealth in the event of noncompliance with regulatory requirements under the [place] (HIPAA) in connection with the good faith provision of telehealth during the XX123456 national public health emergency. ([place])

## 2019-05-16 ENCOUNTER — Ambulatory Visit: Payer: Medicare Other | Attending: Pain Medicine | Admitting: Pain Medicine

## 2019-05-16 ENCOUNTER — Other Ambulatory Visit: Payer: Self-pay

## 2019-05-16 ENCOUNTER — Encounter: Payer: Self-pay | Admitting: Pain Medicine

## 2019-05-16 DIAGNOSIS — M4310 Spondylolisthesis, site unspecified: Secondary | ICD-10-CM | POA: Diagnosis not present

## 2019-05-16 DIAGNOSIS — G8929 Other chronic pain: Secondary | ICD-10-CM

## 2019-05-16 DIAGNOSIS — M48061 Spinal stenosis, lumbar region without neurogenic claudication: Secondary | ICD-10-CM

## 2019-05-16 DIAGNOSIS — M47816 Spondylosis without myelopathy or radiculopathy, lumbar region: Secondary | ICD-10-CM

## 2019-05-16 DIAGNOSIS — M5410 Radiculopathy, site unspecified: Secondary | ICD-10-CM

## 2019-05-16 DIAGNOSIS — M5442 Lumbago with sciatica, left side: Secondary | ICD-10-CM

## 2019-05-16 DIAGNOSIS — M5416 Radiculopathy, lumbar region: Secondary | ICD-10-CM

## 2019-05-16 DIAGNOSIS — E559 Vitamin D deficiency, unspecified: Secondary | ICD-10-CM

## 2019-05-16 DIAGNOSIS — G894 Chronic pain syndrome: Secondary | ICD-10-CM

## 2019-05-16 DIAGNOSIS — M4300 Spondylolysis, site unspecified: Secondary | ICD-10-CM

## 2019-05-16 DIAGNOSIS — M79605 Pain in left leg: Secondary | ICD-10-CM | POA: Diagnosis not present

## 2019-05-16 MED ORDER — TRAMADOL HCL 50 MG PO TABS: 50.0000 mg | ORAL_TABLET | Freq: Three times a day (TID) | ORAL | 5 refills | Status: DC | PRN

## 2019-05-16 MED ORDER — CALCIUM PLUS D3 ABSORBABLE 600-2500 MG-UNIT PO CAPS: 1.0000 | ORAL_CAPSULE | Freq: Every day | ORAL | 3 refills | Status: DC

## 2019-05-16 NOTE — Patient Instructions (Addendum)

## 2019-05-23 ENCOUNTER — Other Ambulatory Visit: Payer: Self-pay | Admitting: Internal Medicine

## 2019-05-24 ENCOUNTER — Ambulatory Visit (HOSPITAL_BASED_OUTPATIENT_CLINIC_OR_DEPARTMENT_OTHER): Payer: Medicare Other | Admitting: Pain Medicine

## 2019-05-24 ENCOUNTER — Encounter: Payer: Self-pay | Admitting: Pain Medicine

## 2019-05-24 ENCOUNTER — Other Ambulatory Visit: Payer: Self-pay

## 2019-05-24 ENCOUNTER — Ambulatory Visit
Admission: RE | Admit: 2019-05-24 | Discharge: 2019-05-24 | Disposition: A | Discharge status: Home / Self Care | Payer: Medicare Other | Source: Ambulatory Visit | Attending: Pain Medicine | Admitting: Pain Medicine

## 2019-05-24 VITALS — BP 129/70 | HR 58 | Temp 98.1°F | Resp 16 | Ht 68.0 in | Wt 250.0 lb

## 2019-05-24 DIAGNOSIS — M4310 Spondylolisthesis, site unspecified: Secondary | ICD-10-CM

## 2019-05-24 DIAGNOSIS — M5410 Radiculopathy, site unspecified: Secondary | ICD-10-CM

## 2019-05-24 DIAGNOSIS — G8929 Other chronic pain: Secondary | ICD-10-CM

## 2019-05-24 DIAGNOSIS — M5416 Radiculopathy, lumbar region: Secondary | ICD-10-CM | POA: Insufficient documentation

## 2019-05-24 DIAGNOSIS — M48061 Spinal stenosis, lumbar region without neurogenic claudication: Secondary | ICD-10-CM | POA: Insufficient documentation

## 2019-05-24 DIAGNOSIS — M5137 Other intervertebral disc degeneration, lumbosacral region: Secondary | ICD-10-CM | POA: Insufficient documentation

## 2019-05-24 DIAGNOSIS — M4300 Spondylolysis, site unspecified: Secondary | ICD-10-CM | POA: Insufficient documentation

## 2019-05-24 MED ORDER — MIDAZOLAM HCL 5 MG/5ML IJ SOLN
1.0000 mg | INTRAMUSCULAR | Status: DC | PRN
  Administered 2019-05-24: 2 mg via INTRAVENOUS

## 2019-05-24 MED ORDER — MIDAZOLAM HCL 5 MG/5ML IJ SOLN
INTRAMUSCULAR | Status: AC
  Filled 2019-05-24: qty 5

## 2019-05-24 MED ORDER — SODIUM CHLORIDE 0.9% FLUSH
2.0000 mL | Freq: Once | INTRAVENOUS | Status: AC
  Administered 2019-05-24: 10 mL

## 2019-05-24 MED ORDER — SODIUM CHLORIDE (PF) 0.9 % IJ SOLN
INTRAMUSCULAR | Status: AC
  Filled 2019-05-24: qty 10

## 2019-05-24 MED ORDER — TRIAMCINOLONE ACETONIDE 40 MG/ML IJ SUSP
INTRAMUSCULAR | Status: AC
  Filled 2019-05-24: qty 1

## 2019-05-24 MED ORDER — LIDOCAINE HCL 2 % IJ SOLN
INTRAMUSCULAR | Status: AC
  Filled 2019-05-24: qty 20

## 2019-05-24 MED ORDER — LIDOCAINE HCL 2 % IJ SOLN
20.0000 mL | Freq: Once | INTRAMUSCULAR | Status: AC
  Administered 2019-05-24: 400 mg

## 2019-05-24 MED ORDER — ROPIVACAINE HCL 2 MG/ML IJ SOLN
2.0000 mL | Freq: Once | INTRAMUSCULAR | Status: AC
  Administered 2019-05-24: 09:00:00 10 mL via EPIDURAL

## 2019-05-24 MED ORDER — ROPIVACAINE HCL 2 MG/ML IJ SOLN
INTRAMUSCULAR | Status: AC
  Filled 2019-05-24: qty 10

## 2019-05-24 MED ORDER — IOHEXOL 180 MG/ML  SOLN
10.0000 mL | Freq: Once | INTRAMUSCULAR | Status: AC
  Administered 2019-05-24: 10 mL via EPIDURAL

## 2019-05-24 MED ORDER — FENTANYL CITRATE (PF) 100 MCG/2ML IJ SOLN
25.0000 ug | INTRAMUSCULAR | Status: DC | PRN
  Administered 2019-05-24: 09:00:00 50 ug via INTRAVENOUS

## 2019-05-24 MED ORDER — IOHEXOL 180 MG/ML  SOLN
INTRAMUSCULAR | Status: AC
  Filled 2019-05-24: qty 20

## 2019-05-24 MED ORDER — LACTATED RINGERS IV SOLN
1000.0000 mL | Freq: Once | INTRAVENOUS | Status: AC
  Administered 2019-05-24: 09:00:00 1000 mL via INTRAVENOUS

## 2019-05-24 MED ORDER — TRIAMCINOLONE ACETONIDE 40 MG/ML IJ SUSP
40.0000 mg | Freq: Once | INTRAMUSCULAR | Status: AC
  Administered 2019-05-24: 09:00:00 40 mg

## 2019-05-24 MED ORDER — FENTANYL CITRATE (PF) 100 MCG/2ML IJ SOLN
INTRAMUSCULAR | Status: AC
  Filled 2019-05-24: qty 2

## 2019-05-24 NOTE — Progress Notes (Signed)
Patient's Name: Hector [name]  MRN: [id number]  Referring Provider: Einar Pheasant, MD  DOB: [birth year]  PCP: Einar Pheasant, MD  DOS: 05/24/2019  Note by: Gaspar Cola, MD  Service setting: Ambulatory outpatient  Specialty: Interventional Pain Management  Patient type: Established  Location: [facility] (AMB) Pain Management Facility  Visit type: Interventional Procedure   Primary Reason for Visit: Interventional Pain Management Treatment. CC: Back Pain (center)  Procedure:          Anesthesia, Analgesia, Anxiolysis:  Type: Diagnostic Epidural Steroid Injection #1  Region: Caudal Level: Sacrococcygeal   Laterality: Midline       Type: Moderate (Conscious) Sedation combined with Local Anesthesia Indication(s): Analgesia and Anxiety Route: Intravenous (IV) IV Access: Secured Sedation: Meaningful verbal contact was maintained at all times during the procedure  Local Anesthetic: Lidocaine 1-2%  Position: Prone   Indications: 1. DDD (degenerative disc disease), lumbosacral   2. Chronic lower extremity radicular pain (L5) (Left)   3. Chronic Lumbar radiculitis (L5) (Left)   4. L5-S1 bilateral pars defect with spondylolisthesis   5. Lumbar foraminal stenosis (L5-S1) (Bilateral) (L>R)   6. Lumbosacral Grade 1 Anterolisthesis of L5 over S1    Pain Score: Pre-procedure: 3 /10 Post-procedure: 0-No pain/10   Pre-op Assessment:  Hector [name] is a 84 y.o. (year old), male patient, seen today for interventional treatment. He  has a past surgical history that includes Prostate surgery; cystocopy (2003); Hemorrhoid surgery; Cardiac catheterization; sleep study; Colon surgery (2006-2008-2011); Cataract Surgery (Right, 02/13/14); Esophagogastroduodenoscopy (egd) with propofol (N/A, 08/04/2016); Colonoscopy w/ polypectomy; and Colonoscopy with propofol (N/A, 02/15/2018). Hector [name] has a current medication list which includes the following prescription(s): acetaminophen, amlodipine, aspirin, calcium plus d3  absorbable, hydrochlorothiazide, hyoscyamine, irbesartan, l-lysine, levothyroxine, omeprazole, tramadol, valacyclovir, and fluticasone, and the following Facility-Administered Medications: fentanyl and midazolam. His primarily concern today is the Back Pain (center)  Initial Vital Signs:  Pulse/HCG Rate: (!) 58ECG Heart Rate: 70 Temp: 98.1 F (36.7 C) Resp: 18 BP: (!) 149/75 SpO2: 99 %  BMI: Estimated body mass index is 38.01 kg/m as calculated from the following:   Height as of this encounter: 5\' 8"  (1.727 m).   Weight as of this encounter: 250 lb (113.4 kg).  Risk Assessment: Allergies: Reviewed. He is allergic to tape.  Allergy Precautions: None required Coagulopathies: Reviewed. None identified.  Blood-thinner therapy: None at this time Active Infection(s): Reviewed. None identified. Hector [name] is afebrile  Site Confirmation: Hector [name] was asked to confirm the procedure and laterality before marking the site Procedure checklist: Completed Consent: Before the procedure and under the influence of no sedative(s), amnesic(s), or anxiolytics, the patient was informed of the treatment options, risks and possible complications. To fulfill our ethical and legal obligations, as recommended by the American Medical Association's Code of Ethics, I have informed the patient of my clinical impression; the nature and purpose of the treatment or procedure; the risks, benefits, and possible complications of the intervention; the alternatives, including doing nothing; the risk(s) and benefit(s) of the alternative treatment(s) or procedure(s); and the risk(s) and benefit(s) of doing nothing. The patient was provided information about the general risks and possible complications associated with the procedure. These may include, but are not limited to: failure to achieve desired goals, infection, bleeding, organ or nerve damage, allergic reactions, paralysis, and death. In addition, the patient was informed of  those risks and complications associated to Spine-related procedures, such as failure to decrease pain; infection (i.e.: Meningitis, epidural or intraspinal abscess); bleeding (  i.e.: epidural hematoma, subarachnoid hemorrhage, or any other type of intraspinal or peri-dural bleeding); organ or nerve damage (i.e.: Any type of peripheral nerve, nerve root, or spinal cord injury) with subsequent damage to sensory, motor, and/or autonomic systems, resulting in permanent pain, numbness, and/or weakness of one or several areas of the body; allergic reactions; (i.e.: anaphylactic reaction); and/or death. Furthermore, the patient was informed of those risks and complications associated with the medications. These include, but are not limited to: allergic reactions (i.e.: anaphylactic or anaphylactoid reaction(s)); adrenal axis suppression; blood sugar elevation that in diabetics may result in ketoacidosis or comma; water retention that in patients with history of congestive heart failure may result in shortness of breath, pulmonary edema, and decompensation with resultant heart failure; weight gain; swelling or edema; medication-induced neural toxicity; particulate matter embolism and blood vessel occlusion with resultant organ, and/or nervous system infarction; and/or aseptic necrosis of one or more joints. Finally, the patient was informed that Medicine is not an exact science; therefore, there is also the possibility of unforeseen or unpredictable risks and/or possible complications that may result in a catastrophic outcome. The patient indicated having understood very clearly. We have given the patient no guarantees and we have made no promises. Enough time was given to the patient to ask questions, all of which were answered to the patient's satisfaction. Hector [name] has indicated that he wanted to continue with the procedure. Attestation: I, the ordering provider, attest that I have discussed with the patient the  benefits, risks, side-effects, alternatives, likelihood of achieving goals, and potential problems during recovery for the procedure that I have provided informed consent. Date  Time: 05/24/2019  8:14 AM  Pre-Procedure Preparation:  Monitoring: As per clinic protocol. Respiration, ETCO2, SpO2, BP, heart rate and rhythm monitor placed and checked for adequate function Safety Precautions: Patient was assessed for positional comfort and pressure points before starting the procedure. Time-out: I initiated and conducted the "Time-out" before starting the procedure, as per protocol. The patient was asked to participate by confirming the accuracy of the "Time Out" information. Verification of the correct person, site, and procedure were performed and confirmed by me, the nursing staff, and the patient. "Time-out" conducted as per Joint Commission's Universal Protocol (UP.01.01.01). Time: [phone]  Description of Procedure:          Target Area: Caudal Epidural Canal. Approach: Midline approach. Area Prepped: Entire Posterior Sacrococcygeal Region Prepping solution: DuraPrep (Iodine Povacrylex [0.7% available iodine] and Isopropyl Alcohol, 74% w/w) Safety Precautions: Aspiration looking for blood return was conducted prior to all injections. At no point did we inject any substances, as a needle was being advanced. No attempts were made at seeking any paresthesias. Safe injection practices and needle disposal techniques used. Medications properly checked for expiration dates. SDV (single dose vial) medications used. Description of the Procedure: Protocol guidelines were followed. The patient was placed in position over the fluoroscopy table. The target area was identified and the area prepped in the usual manner. Skin & deeper tissues infiltrated with local anesthetic. Appropriate amount of time allowed to pass for local anesthetics to take effect. The procedure needles were then advanced to the target area.  Proper needle placement secured. Negative aspiration confirmed. Solution injected in intermittent fashion, asking for systemic symptoms every 0.5cc of injectate. The needles were then removed and the area cleansed, making sure to leave some of the prepping solution back to take advantage of its long term bactericidal properties. Vitals:   05/24/19 0851 05/24/19 0900 05/24/19  0910 05/24/19 0921  BP: 127/64 126/69 (!) 140/92 129/70  Pulse:      Resp: 13 13 17 16   Temp:  98.5 F (36.9 C)  98.1 F (36.7 C)  SpO2: 98% 96% 99% 99%  Weight:      Height:        Start Time: 0841 hrs. End Time: 0850 hrs. Materials:  Needle(s) Type: Epidural needle Gauge: 17G Length: 3.5-in Medication(s): Please see orders for medications and dosing details.  Imaging Guidance (Spinal):          Type of Imaging Technique: Fluoroscopy Guidance (Spinal) Indication(s): Assistance in needle guidance and placement for procedures requiring needle placement in or near specific anatomical locations not easily accessible without such assistance. Exposure Time: Please see nurses notes. Contrast: Before injecting any contrast, we confirmed that the patient did not have an allergy to iodine, shellfish, or radiological contrast. Once satisfactory needle placement was completed at the desired level, radiological contrast was injected. Contrast injected under live fluoroscopy. No contrast complications. See chart for type and volume of contrast used. Fluoroscopic Guidance: I was personally present during the use of fluoroscopy. "Tunnel Vision Technique" used to obtain the best possible view of the target area. Parallax error corrected before commencing the procedure. "Direction-depth-direction" technique used to introduce the needle under continuous pulsed fluoroscopy. Once target was reached, antero-posterior, oblique, and lateral fluoroscopic projection used confirm needle placement in all planes. Images permanently stored in  EMR. Interpretation: I personally interpreted the imaging intraoperatively. Adequate needle placement confirmed in multiple planes. Appropriate spread of contrast into desired area was observed. No evidence of afferent or efferent intravascular uptake. No intrathecal or subarachnoid spread observed. Permanent images saved into the patient's record.  Antibiotic Prophylaxis:   Anti-infectives (From admission, onward)   None     Indication(s): None identified  Post-operative Assessment:  Post-procedure Vital Signs:  Pulse/HCG Rate: (!) 5867 Temp: 98.1 F (36.7 C) Resp: 16 BP: 129/70 SpO2: 99 %  EBL: None  Complications: No immediate post-treatment complications observed by team, or reported by patient.  Note: The patient tolerated the entire procedure well. A repeat set of vitals were taken after the procedure and the patient was kept under observation following institutional policy, for this type of procedure. Post-procedural neurological assessment was performed, showing return to baseline, prior to discharge. The patient was provided with post-procedure discharge instructions, including a section on how to identify potential problems. Should any problems arise concerning this procedure, the patient was given instructions to immediately contact us, at any time, without hesitation. In any case, we plan to contact the patient by telephone for a follow-up status report regarding this interventional procedure.  Comments:  No additional relevant information.  Plan of Care  Orders:  Orders Placed This Encounter  Procedures  . Caudal Epidural Injection    Scheduling Instructions:     Laterality: Midline     Level(s): Sacrococcygeal canal (Tailbone area)     Sedation: Patient's choice     Timeframe: Today    Order Specific Question:   Where will this procedure be performed?    Answer:   [facility]  . DG PAIN [facility] C-ARM 1-60 MIN NO REPORT    Intraoperative interpretation by  procedural physician at [place].    Standing Status:   Standing    Number of Occurrences:   1    Order Specific Question:   Reason for exam:    Answer:   Assistance in needle guidance and  placement for procedures requiring needle placement in or near specific anatomical locations not easily accessible without such assistance.  . Informed Consent Details: Physician/Practitioner Attestation; Transcribe to consent form and obtain patient signature    Nursing Order: Transcribe to consent form and obtain patient signature. Note: Always confirm laterality of pain with Mr. Zein, before procedure. Procedure: Caudal epidural steroid injection Indication/Reason: Low back pain and lower extremity pain secondary to lumbosacral radiculitis Provider Attestation: I, [place] Dossie Arbour, MD, (Pain Management Specialist), the physician/practitioner, attest that I have discussed with the patient the benefits, risks, side effects, alternatives, likelihood of achieving goals and potential problems during recovery for the procedure that I have provided informed consent.  . Provide equipment / supplies at bedside    Equipment required: Single use, disposable, "Epidural Tray" Epidural Catheter: NOT required    Standing Status:   Standing    Number of Occurrences:   1    Order Specific Question:   Specify    Answer:   Epidural Tray   Chronic Opioid Analgesic:  Tramadol 50 mg, 1 tab PO q 8 hrs (150 mg/day of tramadol) MME/day:15mg /day.   Medications ordered for procedure: Meds ordered this encounter  Medications  . iohexol (OMNIPAQUE) 180 MG/ML injection 10 mL    Must be Myelogram-compatible. If not available, you may substitute with a water-soluble, non-ionic, hypoallergenic, myelogram-compatible radiological contrast medium.  Marland Kitchen lidocaine (XYLOCAINE) 2 % (with pres) injection 400 mg  . lactated ringers infusion 1,000 mL  . midazolam (VERSED) 5 MG/5ML injection 1-2 mg    Make sure Flumazenil  is available in the pyxis when using this medication. If oversedation occurs, administer 0.2 mg IV over 15 sec. If after 45 sec no response, administer 0.2 mg again over 1 min; may repeat at 1 min intervals; not to exceed 4 doses (1 mg)  . fentaNYL (SUBLIMAZE) injection 25-50 mcg    Make sure Narcan is available in the pyxis when using this medication. In the event of respiratory depression (RR< 8/min): Titrate NARCAN (naloxone) in increments of 0.1 to 0.2 mg IV at 2-3 minute intervals, until desired degree of reversal.  . sodium chloride flush (NS) 0.9 % injection 2 mL  . ropivacaine (PF) 2 mg/mL (0.2%) (NAROPIN) injection 2 mL  . triamcinolone acetonide (KENALOG-40) injection 40 mg   Medications administered: We administered iohexol, lidocaine, lactated ringers, midazolam, fentaNYL, sodium chloride flush, ropivacaine (PF) 2 mg/mL (0.2%), and triamcinolone acetonide.  See the medical record for exact dosing, route, and time of administration.  Follow-up plan:   Return in about 2 weeks (around 06/07/2019) for (VV), (PP).       Interventional management options: Planned, scheduled, and/or pending:      Considering:   Diagnostic left L5 TFESI  Possible left L5 nerve root ganglion RFA Diagnosticleft L4-5 LESI   Palliative PRN treatment(s):   Diagnostic midline caudal ESI #2  Palliativebilateral lumbar facet block #3 Palliative right-sided lumbar facet RFA #2 (last done 04/18/2018)  Palliative left-sided lumbar facet RFA #2 (last done 03/21/2018)     Recent Visits Date Type Provider Dept  05/16/19 Office Visit Milinda Pointer, MD [facility]  Showing recent visits within past 90 days and meeting all other requirements   Today's Visits Date Type Provider Dept  05/24/19 Procedure visit Milinda Pointer, MD [facility]  Showing today's visits and meeting all other requirements   Future Appointments Date Type Provider Dept  06/11/19 Appointment Milinda Pointer, MD [facility]  Showing future  appointments within next 90 days and meeting all other requirements   Disposition: Discharge home  Discharge Date & Time: 05/24/2019; 0922 hrs.   Primary Care Physician: Einar Pheasant, MD Location: [facility] Note by: Gaspar Cola, MD Date: 05/24/2019; Time: 9:35 AM  Disclaimer:  Medicine is not an Chief Strategy Officer. The only guarantee in medicine is that nothing is guaranteed. It is important to note that the decision to proceed with this intervention was based on the information collected from the patient. The Data and conclusions were drawn from the patient's questionnaire, the interview, and the physical examination. Because the information was provided in large part by the patient, it cannot be guaranteed that it has not been purposely or unconsciously manipulated. Every effort has been made to obtain as much relevant data as possible for this evaluation. It is important to note that the conclusions that lead to this procedure are derived in large part from the available data. Always take into account that the treatment will also be dependent on availability of resources and existing treatment guidelines, considered by other Pain Management Practitioners as being common knowledge and practice, at the time of the intervention. For Medico-Legal purposes, it is also important to point out that variation in procedural techniques and pharmacological choices are the acceptable norm. The indications, contraindications, technique, and results of the above procedure should only be interpreted and judged by a Board-Certified Interventional Pain Specialist with extensive familiarity and expertise in the same exact procedure and technique.

## 2019-05-24 NOTE — Progress Notes (Signed)
Safety precautions to be maintained throughout the outpatient stay will include: orient to surroundings, keep bed in low position, maintain call bell within reach at all times, provide assistance with transfer out of bed and ambulation.  

## 2019-05-24 NOTE — Patient Instructions (Signed)

## 2019-05-25 ENCOUNTER — Telehealth: Payer: Self-pay

## 2019-05-25 NOTE — Telephone Encounter (Signed)
No answer- Left message to call if needed 

## 2019-05-29 ENCOUNTER — Encounter: Payer: Self-pay | Admitting: Internal Medicine

## 2019-05-29 ENCOUNTER — Ambulatory Visit (INDEPENDENT_AMBULATORY_CARE_PROVIDER_SITE_OTHER): Payer: Medicare Other | Admitting: Internal Medicine

## 2019-05-29 ENCOUNTER — Ambulatory Visit (INDEPENDENT_AMBULATORY_CARE_PROVIDER_SITE_OTHER): Payer: Medicare Other

## 2019-05-29 ENCOUNTER — Other Ambulatory Visit: Payer: Self-pay

## 2019-05-29 VITALS — BP 127/64 | HR 58

## 2019-05-29 VITALS — BP 138/61 | HR 48

## 2019-05-29 DIAGNOSIS — M47816 Spondylosis without myelopathy or radiculopathy, lumbar region: Secondary | ICD-10-CM

## 2019-05-29 DIAGNOSIS — Z86010 Personal history of colonic polyps: Secondary | ICD-10-CM | POA: Diagnosis not present

## 2019-05-29 DIAGNOSIS — E538 Deficiency of other specified B group vitamins: Secondary | ICD-10-CM | POA: Diagnosis not present

## 2019-05-29 DIAGNOSIS — K2270 Barrett's esophagus without dysplasia: Secondary | ICD-10-CM

## 2019-05-29 DIAGNOSIS — Z0000 Encounter for general adult medical examination without abnormal findings: Secondary | ICD-10-CM

## 2019-05-29 DIAGNOSIS — E7800 Pure hypercholesterolemia, unspecified: Secondary | ICD-10-CM

## 2019-05-29 DIAGNOSIS — R739 Hyperglycemia, unspecified: Secondary | ICD-10-CM

## 2019-05-29 DIAGNOSIS — C439 Malignant melanoma of skin, unspecified: Secondary | ICD-10-CM

## 2019-05-29 DIAGNOSIS — E039 Hypothyroidism, unspecified: Secondary | ICD-10-CM

## 2019-05-29 DIAGNOSIS — E559 Vitamin D deficiency, unspecified: Secondary | ICD-10-CM

## 2019-05-29 DIAGNOSIS — I10 Essential (primary) hypertension: Secondary | ICD-10-CM

## 2019-05-29 NOTE — Progress Notes (Addendum)
Subjective:   Hector [name] is a 84 y.o. male who presents for Medicare Annual (Subsequent) preventive examination.  Review of Systems:  No ROS.  Medicare Wellness Virtual Visit.  Visual/audio telehealth visit. Vital signs provided per patient today.  See social history for additional risk factors.   Cardiac Risk Factors include: advanced age (>47men, >67 women);male gender;hypertension     Objective:     Vitals: BP 138/61 (BP Location: Left Arm, Patient Position: Sitting, Cuff Size: Normal)   Pulse (!) 48   There is no height or weight on file to calculate BMI.  Advanced Directives 05/29/2019 05/25/2018 03/21/2018 10/27/2017 05/24/2017 08/04/2016 05/24/2016  Does Patient Have a Medical Advance Directive? Yes Yes Yes Yes Yes Yes Yes  Type of Paramedic of [place];Living will [place];Living will [place];Living will Living will;Healthcare Power of [place];Living will [place];Living will [place];Living will  Does patient want to make changes to medical advance directive? No - Patient declined No - Patient declined - - No - Patient declined - -  Copy of [place] in Chart? Yes - validated most recent copy scanned in chart (See row information) Yes - - Yes No - copy requested Yes    Tobacco Social History   Tobacco Use  Smoking Status Never Smoker  Smokeless Tobacco Never Used     Counseling given: Not Answered   Clinical Intake:  Pre-visit preparation completed: Yes        Diabetes: No  How often do you need to have someone help you when you read instructions, pamphlets, or other written materials from your doctor or pharmacy?: 1 - Never  Interpreter Needed?: No     Past Medical History:  Diagnosis Date  . Acute postoperative pain 03/21/2018  . Anemia   . Arthritis   . Barrett esophagus   . Cancer ([place])    prostate,skin  . Chicken pox   . Diverticulitis   . Dysrhythmia   . GERD (gastroesophageal reflux disease)   . Hyperlipidemia   . Hypertension   . Hypothyroidism   . Melanoma ([place])    Malignant resection  . Sleep apnea   . Ulcer    Past Surgical History:  Procedure Laterality Date  . CARDIAC CATHETERIZATION    . Cataract Surgery Right 02/13/14  . COLON SURGERY  2006-2008-2011   polyps removed  . COLONOSCOPY W/ POLYPECTOMY    . COLONOSCOPY WITH PROPOFOL N/A 02/15/2018   Procedure: COLONOSCOPY WITH PROPOFOL;  Surgeon: Manya Silvas, MD;  Location[facility] ENDOSCOPY;  Service: Endoscopy;  Laterality: N/A;  . cystocopy  2003  . ESOPHAGOGASTRODUODENOSCOPY (EGD) WITH PROPOFOL N/A 08/04/2016   Procedure: ESOPHAGOGASTRODUODENOSCOPY (EGD) WITH PROPOFOL;  Surgeon: Manya Silvas, MD;  Location: [facility] ENDOSCOPY;  Service: Endoscopy;  Laterality: N/A;  . HEMORRHOID SURGERY    . PROSTATE SURGERY    . sleep study     Family History  Problem Relation Age of Onset  . Liver disease Mother   . Heart disease Father   . Kidney disease Father    Social History   Socioeconomic History  . Marital status: Married    Spouse name: Not on file  . Number of children: Not on file  . Years of education: Not on file  . Highest education level: Not on file  Occupational History  . Not on file  Social Needs  . Financial resource strain: Not  hard at all  . Food insecurity    Worry: Never true    Inability: Never true  . Transportation needs    Medical: No    Non-medical: No  Tobacco Use  . Smoking status: Never Smoker  . Smokeless tobacco: Never Used  Substance and Sexual Activity  . Alcohol use: No    Alcohol/week: 0.0 standard drinks  . Drug use: No  . Sexual activity: Never  Lifestyle  . Physical activity    Days per week: 0 days    Minutes per session: Not on file  . Stress: Not at all  Relationships  . Social Herbalist on phone: Not on file    Gets together: Not on  file    Attends religious service: Not on file    Active member of club or organization: Not on file    Attends meetings of clubs or organizations: Not on file    Relationship status: Married  Other Topics Concern  . Not on file  Social History Narrative  . Not on file    Outpatient Encounter Medications as of 05/29/2019  Medication Sig  . acetaminophen (TYLENOL) 500 MG tablet Take 500 mg by mouth every 6 (six) hours as needed.  Marland Kitchen amLODipine (NORVASC) 5 MG tablet TAKE 1 TABLET BY MOUTH DAILY  . aspirin 81 MG tablet Take 81 mg by mouth daily.  . Calcium Carb-Cholecalciferol (CALCIUM PLUS D3 ABSORBABLE) [phone] MG-UNIT CAPS Take 1 capsule by mouth daily with breakfast.  . fluticasone (FLONASE) 50 MCG/ACT nasal spray TWO PUFFS IN EACH NOSTRIL ONCE A DAY  . hydrochlorothiazide (HYDRODIURIL) 25 MG tablet TAKE 1 TABLET BY MOUTH DAILY  . hyoscyamine (LEVSIN, ANASPAZ) 0.125 MG tablet Take 0.125 mg by mouth as needed.  . irbesartan (AVAPRO) 300 MG tablet TAKE 1 TABLET BY MOUTH DAILY  . L-Lysine 500 MG TABS Take by mouth daily.  Marland Kitchen levothyroxine (SYNTHROID) 100 MCG tablet TAKE 1 TABLET BY MOUTH DAILY WITH BREAKFAST  . omeprazole (PRILOSEC) 20 MG capsule TAKE ONE CAPSULE BY MOUTH TWICE A DAY  . traMADol (ULTRAM) 50 MG tablet Take 1 tablet (50 mg total) by mouth every 8 (eight) hours as needed for severe pain.  . valACYclovir (VALTREX) 1000 MG tablet TAKE 1 TABLET BY MOUTH DAILY   No facility-administered encounter medications on file as of 05/29/2019.     Activities of Daily Living In your present state of health, do you have any difficulty performing the following activities: 05/29/2019  Hearing? N  Vision? N  Difficulty concentrating or making decisions? N  Walking or climbing stairs? N  Dressing or bathing? N  Doing errands, shopping? N  Preparing Food and eating ? N  Using the Toilet? N  In the past six months, have you accidently leaked urine? N  Do you have problems with loss of  bowel control? N  Managing your Medications? N  Managing your Finances? N  Housekeeping or managing your Housekeeping? N  Some recent data might be hidden    Patient Care Team: Einar Pheasant, MD as PCP - General (Internal Medicine) Einar Pheasant, MD (Internal Medicine)    Assessment:   This is a routine wellness examination for Hector [name].  Nurse connected with patient 05/29/19 at  9:30 AM EDT by a telephone enabled telemedicine application and verified that I am speaking with the correct person using two identifiers. Patient stated full name and DOB. Patient gave permission to continue with virtual visit. Patient's location was at home  and Nurse's location was at [place] office.   Health Maintenance Due: See completed HM at the end of note.   Eye: Visual acuity not assessed. Virtual visit. Wears corrective lenses.    Dental: UTD Dentures- partial  Hearing: Demonstrates normal hearing during visit.  Safety:  Patient feels safe at home- yes Patient does have smoke detectors at home- yes Patient does wear sunscreen or protective clothing when in direct sunlight - yes Patient does wear seat belt when in a moving vehicle - yes Patient drives- yes Adequate lighting in walkways free from debris- yes Grab bars and handrails used as appropriate- yes Ambulates with no assistive device Cell phone on person when ambulating outside of the home- yes  Social: Alcohol intake - no     Smoking history- never   Smokers in home? none Illicit drug use? none  Depression: PHQ 2 &9 complete. See screening below. Denies irritability, anhedonia, sadness/tearfullness.    Falls: See screening below.    Medication: Taking as directed and without issues. Receives them in a prepackage.   Covid-19: Precautions and sickness symptoms discussed. Wears mask, social distancing, hand hygiene as appropriate. Daughter is grocery shopping for the family.   Activities of Daily Living Patient denies  needing assistance with: household chores, feeding themselves, getting from bed to chair, getting to the toilet, bathing/showering, dressing, managing money, or preparing meals.   Memory: Patient is alert. Patient denies difficulty focusing or concentrating. Correctly identified the president of the [place], season and recall.  BMI- discussed the importance of a healthy diet, water intake and the benefits of aerobic exercise.  Educational material provided.  Physical activity- no routine, active around the home.  Diet:  Regular Water: good intake  Other Providers Patient Care Team: Einar Pheasant, MD as PCP - General (Internal Medicine) Einar Pheasant, MD (Internal Medicine)   Exercise Activities and Dietary recommendations Current Exercise Habits: The patient does not participate in regular exercise at present, Intensity: Mild  Goals    . Increase physical activity     Stay active and hydrated Healthy diet; portion control        Fall Risk Fall Risk  05/29/2019 05/24/2019 05/25/2018 05/15/2018 04/18/2018  Falls in the past year? 0 0 No No No  Number falls in past yr: - 0 - - -  Injury with Fall? - 0 - - -  Comment - - - - -  Follow up - - - - -   Timed Get Up and Go performed: no, virtual visit  Depression Screen PHQ 2/9 Scores 05/29/2019 05/25/2018 04/18/2018 03/21/2018  PHQ - 2 Score 0 0 0 0  PHQ- 9 Score - - - -     Cognitive Function MMSE - Mini Mental State Exam 05/24/2016 05/13/2015  Orientation to time 5 5  Orientation to Place 5 5  Registration 3 3  Attention/ Calculation 5 5  Recall 3 3  Language- name 2 objects 2 2  Language- repeat 1 1  Language- follow 3 step command 3 3  Language- read & follow direction 1 1  Write a sentence 1 1  Copy design 1 1  Total score 30 30     6CIT Screen 05/29/2019 05/25/2018 05/24/2017  What Year? 0 points 0 points 0 points  What month? 0 points 0 points 0 points  What time? 0 points 0 points 0 points  Count back  from 20 0 points 0 points 0 points  Months in reverse 0 points 0 points 0 points  Repeat phrase 0 points 0 points 0 points  Total Score 0 0 0    Immunization History  Administered Date(s) Administered  . Fluad Quad(high Dose 65+) 04/25/2019  . Influenza Split 05/03/2012, 05/01/2014  . Influenza Whole 05/09/2017  . Influenza, High Dose Seasonal PF 04/13/2016, 03/30/2018  . Influenza,inj,Quad PF,6+ Mos 05/13/2015  . Pneumococcal Conjugate-13 04/17/2014  . Pneumococcal Polysaccharide-23 05/24/2016  . Td 04/17/2014  . Zoster 05/02/2010   Screening Tests Health Maintenance  Topic Date Due  . TETANUS/TDAP  04/17/2024  . INFLUENZA VACCINE  Completed  . PNA vac Low Risk Adult  Completed      Plan:   Keep all routine maintenance appointments.   Follow up with your doctor today 05/29/19.   Medicare Attestation I have personally reviewed: The patient's medical and social history Their use of alcohol, tobacco or illicit drugs Their current medications and supplements The patient's functional ability including ADLs,fall risks, home safety risks, cognitive, and hearing and visual impairment Diet and physical activities Evidence for depression   In addition, I have reviewed and discussed with patient certain preventive protocols, quality metrics, and best practice recommendations. A written personalized care plan for preventive services as well as general preventive health recommendations were provided to patient via mail.     Varney Biles, LPN  [id number]   Reviewed above information.  Agree with assessment and plan.    Dr Nicki Reaper

## 2019-05-29 NOTE — Assessment & Plan Note (Signed)
On thyroid replacement.  Follow tsh.  

## 2019-05-29 NOTE — Assessment & Plan Note (Signed)
Follow vitamin D level.  

## 2019-05-29 NOTE — Assessment & Plan Note (Signed)
Blood pressure has been under good control.  Continue same medication regimen.  Follow pressures.  Follow metabolic panel.   

## 2019-05-29 NOTE — Progress Notes (Addendum)
[name], male   DOB: 06/27/1935, 84 y.o.   MRN: [id number]   Virtual Visit via telephone Note  This visit type was conducted due to national recommendations for restrictions regarding the COVID-19 pandemic (e.g. social distancing).  This format is felt to be most appropriate for this patient at this time.  All issues noted in this document were discussed and addressed.  No physical exam was performed (except for noted visual exam findings with Video Visits).   I connected with [name] by telephone and verified that I am speaking with the correct person using two identifiers. Location patient: home Location provider: work  Persons participating in the telephone visit: patient, provider  I discussed the limitations, risks, security and privacy concerns of performing an evaluation and management service by telephone and the availability of in person appointments.  The patient expressed understanding and agreed to proceed.   Reason for visit: scheduled follow up.   HPI: He reports he is doing relatively well.  Had is s/p injection 05/24/19 (pain clinic).  States feels the best he has felt.  No pain currently.  Tries to stay active.  No chest pain.  No sob.  No acid reflux. On omeprazole.  Controls symptoms.   No abdominal pain.  Bowels moving.  Taking oral B12.  Blood pressure doing well.  Handling stress.     ROS: See pertinent positives and negatives per HPI.  Past Medical History:  Diagnosis Date   Acute postoperative pain 03/21/2018   Anemia    Arthritis    Barrett esophagus    Cancer ([place])    prostate,skin   Chicken pox    Diverticulitis    Dysrhythmia    GERD (gastroesophageal reflux disease)    Hyperlipidemia    Hypertension    Hypothyroidism    Melanoma ([place])    Malignant resection   Sleep apnea    Ulcer     Past Surgical History:  Procedure Laterality Date   CARDIAC CATHETERIZATION     Cataract Surgery Right 02/13/14   COLON  SURGERY  2006-2008-2011   polyps removed   COLONOSCOPY W/ POLYPECTOMY     COLONOSCOPY WITH PROPOFOL N/A 02/15/2018   Procedure: COLONOSCOPY WITH PROPOFOL;  Surgeon: Manya Silvas, MD;  Location: [facility] ENDOSCOPY;  Service: Endoscopy;  Laterality: N/A;   cystocopy  2003   ESOPHAGOGASTRODUODENOSCOPY (EGD) WITH PROPOFOL N/A 08/04/2016   Procedure: ESOPHAGOGASTRODUODENOSCOPY (EGD) WITH PROPOFOL;  Surgeon: Manya Silvas, MD;  Location: [facility] ENDOSCOPY;  Service: Endoscopy;  Laterality: N/A;   HEMORRHOID SURGERY     PROSTATE SURGERY     sleep study      Family History  Problem Relation Age of Onset   Liver disease Mother    Heart disease Father    Kidney disease Father     SOCIAL HX: reviewed.    Current Outpatient Medications:    acetaminophen (TYLENOL) 500 MG tablet, Take 500 mg by mouth every 6 (six) hours as needed., Disp: , Rfl:    amLODipine (NORVASC) 5 MG tablet, TAKE 1 TABLET BY MOUTH DAILY, Disp: 30 tablet, Rfl: 5   aspirin 81 MG tablet, Take 81 mg by mouth daily., Disp: , Rfl:    Calcium Carb-Cholecalciferol (CALCIUM PLUS D3 ABSORBABLE) [phone] MG-UNIT CAPS, Take 1 capsule by mouth daily with breakfast., Disp: 90 capsule, Rfl: 3   fluticasone (FLONASE) 50 MCG/ACT nasal spray, TWO PUFFS IN EACH NOSTRIL ONCE A DAY, Disp: 16 g, Rfl: 4   hydrochlorothiazide (HYDRODIURIL)  25 MG tablet, TAKE 1 TABLET BY MOUTH DAILY, Disp: 30 tablet, Rfl: 5   hyoscyamine (LEVSIN, ANASPAZ) 0.125 MG tablet, Take 0.125 mg by mouth as needed., Disp: , Rfl:    irbesartan (AVAPRO) 300 MG tablet, TAKE 1 TABLET BY MOUTH DAILY, Disp: 30 tablet, Rfl: 5   L-Lysine 500 MG TABS, Take by mouth daily., Disp: , Rfl:    levothyroxine (SYNTHROID) 100 MCG tablet, TAKE 1 TABLET BY MOUTH DAILY WITH BREAKFAST, Disp: 30 tablet, Rfl: 3   omeprazole (PRILOSEC) 20 MG capsule, TAKE ONE CAPSULE BY MOUTH TWICE A DAY, Disp: 60 capsule, Rfl: 3   traMADol (ULTRAM) 50 MG tablet, Take 1 tablet (50 mg total) by  mouth every 8 (eight) hours as needed for severe pain., Disp: 90 tablet, Rfl: 5   valACYclovir (VALTREX) 1000 MG tablet, TAKE 1 TABLET BY MOUTH DAILY, Disp: 30 tablet, Rfl: 3  EXAM:  VITALS per patient if applicable:127/64, 99991111  GENERAL: alert.  Sounds to be in no acute distress.  Answering questions appropriately.    PSYCH/NEURO: pleasant and cooperative, no obvious depression or anxiety, speech and thought processing grossly intact  ASSESSMENT AND PLAN:  Discussed the following assessment and plan:  B12 deficiency Taking oral B12.  Has stopped injections.  Recheck B12 with next labs.    Barrett's esophagus Has been followed by GI.  Last EGD 08/2016.  Upper symptoms controlled on omeprazole.  F/u with GI regarding when due f/u.    History of colonic polyps Colonoscopy 01/2018 - multiple polyps (tubular adenomas) - 22 removed.  Continue f/u with GI.    Hypercholesterolemia Low cholesterol diet and exercise.  Follow lipid panel.   Hypertension Blood pressure has been under good control.  Continue same medication regimen.  Follow pressures.  Follow metabolic panel.    Hypothyroid On thyroid replacement.  Follow tsh.    Malignant melanoma ([place]) Followed by dermatology.  Has f/u with dermatology 07/02/19.    Osteoarthritis of lumbar spine Followed by pain clinic.  S/p injection last week.  Back better.  Follow.    Vitamin D deficiency Follow vitamin D level.      I discussed the assessment and treatment plan with the patient. The patient was provided an opportunity to ask questions and all were answered. The patient agreed with the plan and demonstrated an understanding of the instructions.   The patient was advised to call back or seek an in-person evaluation if the symptoms worsen or if the condition fails to improve as anticipated.   I provided 15 minutes of non face to face time during this encounter.   Einar Pheasant, MD

## 2019-05-29 NOTE — Assessment & Plan Note (Signed)
Taking oral B12.  Has stopped injections.  Recheck B12 with next labs.

## 2019-05-29 NOTE — Patient Instructions (Signed)
  [name] , Thank you for taking time to come for your Medicare Wellness Visit. I appreciate your ongoing commitment to your health goals. Please review the following plan we discussed and let me know if I can assist you in the future.   These are the goals we discussed: Goals    . Increase physical activity     Stay active and hydrated Healthy diet; portion control        This is a list of the screening recommended for you and due dates:  Health Maintenance  Topic Date Due  . Tetanus Vaccine  04/17/2024  . Flu Shot  Completed  . Pneumonia vaccines  Completed

## 2019-05-29 NOTE — Assessment & Plan Note (Signed)
Has been followed by GI.  Last EGD 08/2016.  Upper symptoms controlled on omeprazole.  F/u with GI regarding when due f/u.

## 2019-05-29 NOTE — Assessment & Plan Note (Signed)
Followed by dermatology.  Has f/u with dermatology 07/02/19.

## 2019-05-29 NOTE — Assessment & Plan Note (Signed)
Colonoscopy 01/2018 - multiple polyps (tubular adenomas) - 22 removed.  Continue f/u with GI.

## 2019-05-29 NOTE — Assessment & Plan Note (Signed)
Followed by pain clinic.  S/p injection last week.  Back better.  Follow.

## 2019-05-29 NOTE — Assessment & Plan Note (Signed)
Low cholesterol diet and exercise.  Follow lipid panel.   

## 2019-06-10 NOTE — Progress Notes (Signed)
Pain Management Virtual Encounter Note - Virtual Visit via Telephone Telehealth (real-time audio visits between healthcare provider and patient).   Patient's Phone No. & Preferred Pharmacy:  [phone] (home); [phone] (mobile); (Preferred) [phone] [email or web address] [email or web address]  [facility] [place], [place] - [address] [place] [address] Phone: [phone] Fax: [phone]    Pre-screening note:  Our staff contacted Hector [name] and offered him an "in person", "face-to-face" appointment versus a telephone encounter. He indicated preferring the telephone encounter, at this time.   Reason for Virtual Visit: COVID-19*  Social distancing based on CDC and AMA recommendations.   I contacted Hector [name] on 06/11/2019 via telephone.      I clearly identified myself as Gaspar Cola, MD. I verified that I was speaking with the correct person using two identifiers (Name: Hector [name], and date of birth: 02/10/1935).  Advanced Informed Consent I sought verbal advanced consent from Hector [name] for virtual visit interactions. I informed Hector [name] of possible security and privacy concerns, risks, and limitations associated with providing "not-in-person" medical evaluation and management services. I also informed Hector [name] of the availability of "in-person" appointments. Finally, I informed him that there would be a charge for the virtual visit and that he could be  personally, fully or partially, financially responsible for it. Hector [name] expressed understanding and agreed to proceed.   Historic Elements   Hector [name] is a 84 y.o. year old, male patient evaluated today after his last encounter by our practice on 05/25/2019. Hector [name]  has a past medical history of Acute postoperative pain (03/21/2018), Anemia, Arthritis, Barrett esophagus, Cancer ([place]), Chicken pox, Diverticulitis, Dysrhythmia, GERD (gastroesophageal reflux disease), Hyperlipidemia,  Hypertension, Hypothyroidism, Melanoma ([place]), Sleep apnea, and Ulcer. He also  has a past surgical history that includes Prostate surgery; cystocopy (2003); Hemorrhoid surgery; Cardiac catheterization; sleep study; Colon surgery (2006-2008-2011); Cataract Surgery (Right, 02/13/14); Esophagogastroduodenoscopy (egd) with propofol (N/A, 08/04/2016); Colonoscopy w/ polypectomy; and Colonoscopy with propofol (N/A, 02/15/2018). Mr. Boeder has a current medication list which includes the following prescription(s): acetaminophen, amlodipine, aspirin, calcium plus d3 absorbable, fluticasone, hydrochlorothiazide, hyoscyamine, irbesartan, l-lysine, levothyroxine, omeprazole, tramadol, and valacyclovir. He  reports that he has never smoked. He has never used smokeless tobacco. He reports that he does not drink alcohol or use drugs. Hector [name] is allergic to tape.   HPI  Today, he is being contacted for a post-procedure assessment.  The patient indicated 100% relief of the pain for approximately 9 days.  Unfortunately, some of that is beginning to come back.  He indicates that the pain in that region of the sacral area, did go away completely.  Today we have talked about several alternatives and we have decided to go ahead and repeat this injection and if again he gets good relief, but it does not show to improve any further in terms of the duration of benefits, we may need to consider the possibility of a Racz procedure.  He understood and accepted.  Post-Procedure Evaluation  Procedure: Diagnostic caudal epidural steroid injection #1 (Midline) under fluoroscopic guidance and IV sedation Pre-procedure pain level: 3/10 Post-procedure: 0/10 (100% relief)  Sedation: Sedation provided.  Pain relief after procedure (treated area only): (Questions asked to patient) 1. About 15 minutes after the procedure, while the area was still numb from the local anesthetics, were you having any pain in the area? First 1 hour: 100 %  better. Initial 4-6 hours: 100 % better. 2. How many days  was the area numb? Any benefit longer than 6 hours: 100% better. 3. How much better is your pain now, when compared to before the procedure? Current benefit: 50 % better. 4. Can you move better now? Improvement in ROM (Range of Motion): Yes. 5. Can you do more now? Improvement in function: up to a point. 4. Did you have any problems with the procedure? Side-effects/Complications: No.  Current benefits: Defined as benefit that persist at this time.   Analgesia:   He indicates having experienced 100% relief of the pain and the sacral region for approximately 9 days.  Unfortunately, some of that is beginning to come back.  Function: Somewhat improved ROM: Somewhat improved  Pharmacotherapy Assessment  Analgesic: Tramadol 50 mg, 1 tab PO q 8 hrs (150 mg/day of tramadol) MME/day:15mg /day.   Monitoring: Pharmacotherapy: No side-effects or adverse reactions reported. [place] PMP: PDMP reviewed during this encounter.       Compliance: No problems identified. Effectiveness: Clinically acceptable. Plan: Refer to "POC".  UDS: No results found for: SUMMARY Laboratory Chemistry Profile (12 mo)  Renal: 10/10/2018: BUN 23; Creatinine, Ser 1.42  Lab Results  Component Value Date   GFR 47.81 (L) 10/10/2018   GFRAA >60 01/23/2018   GFRNONAA 56 (L) 01/23/2018   Hepatic: 08/03/2018: Albumin 4.2 Lab Results  Component Value Date   AST 27 08/03/2018   ALT 28 08/03/2018   Other: No results found for requested labs within last 8760 hours. Note: Above Lab results reviewed.  Imaging  Last 90 days:  [facility] C-arm 1-60 Min No Report  Result Date: 05/24/2019 Fluoro was used, but no Radiologist interpretation will be provided. Please refer to "NOTES" tab for provider progress note.   Assessment  The primary encounter diagnosis was DDD (degenerative disc disease), lumbosacral. Diagnoses of Chronic lower extremity radicular pain (L5)  (Left), Chronic Lumbar radiculitis (L5) (Left), L5-S1 bilateral pars defect with spondylolisthesis, and Lumbar foraminal stenosis (L5-S1) (Bilateral) (L>R) were also pertinent to this visit.  Plan of Care  I am having Hector [name] maintain his aspirin, hyoscyamine, acetaminophen, L-Lysine, fluticasone, Calcium Plus D3 Absorbable, traMADol, omeprazole, levothyroxine, valACYclovir, hydrochlorothiazide, irbesartan, and amLODipine.  Pharmacotherapy (Medications Ordered): No orders of the defined types were placed in this encounter.  Orders:  Orders Placed This Encounter  Procedures  . Caudal ESI (Schedule)    Standing Status:   Future    Standing Expiration Date:   07/11/2019    Scheduling Instructions:     Laterality: Midline     Level(s): Sacrococcygeal canal (Tailbone area)     Sedation: Patient's choice     Scheduling Timeframe: As soon as pre-approved    Order Specific Question:   Where will this procedure be performed?    Answer:   [facility]   Follow-up plan:   Return for Procedure (w/ sedation): (ML) Caudal ESI #2, (ASAP).      Interventional management options: Planned, scheduled, and/or pending:      Considering:   Diagnostic left L5 TFESI  Possible left L5 nerve root ganglion RFA Diagnosticleft L4-5 LESI   Palliative PRN treatment(s):   Diagnostic midline caudal ESI #2  Palliativebilateral lumbar facet block #3 Palliative right-sided lumbar facet RFA #2 (last done 04/18/2018)  Palliative left-sided lumbar facet RFA #2 (last done 03/21/2018)      Recent Visits Date Type Provider Dept  05/24/19 Procedure visit Milinda Pointer, MD [facility]  05/16/19 Office Visit Milinda Pointer, MD [facility]  Showing recent visits  within past 90 days and meeting all other requirements   Today's Visits Date Type Provider Dept  06/11/19 Telemedicine Milinda Pointer, MD [facility]  Showing today's visits and meeting all other  requirements   Future Appointments No visits were found meeting these conditions.  Showing future appointments within next 90 days and meeting all other requirements   I discussed the assessment and treatment plan with the patient. The patient was provided an opportunity to ask questions and all were answered. The patient agreed with the plan and demonstrated an understanding of the instructions.  Patient advised to call back or seek an in-person evaluation if the symptoms or condition worsens.  Total duration of non-face-to-face encounter: 13 minutes.  Note by: Gaspar Cola, MD Date: 06/11/2019; Time: 3:03 PM  Note: This dictation was prepared with Dragon dictation. Any transcriptional errors that may result from this process are unintentional.  Disclaimer:  * Given the special circumstances of the COVID-19 pandemic, the federal government has announced that the Office for Civil Rights (OCR) will exercise its enforcement discretion and will not impose penalties on physicians using telehealth in the event of noncompliance with regulatory requirements under the [place] and [place] (HIPAA) in connection with the good faith provision of telehealth during the XX123456 national public health emergency. ([place])

## 2019-06-11 ENCOUNTER — Ambulatory Visit: Payer: Medicare Other | Attending: Pain Medicine | Admitting: Pain Medicine

## 2019-06-11 ENCOUNTER — Other Ambulatory Visit: Payer: Self-pay

## 2019-06-11 DIAGNOSIS — G8929 Other chronic pain: Secondary | ICD-10-CM

## 2019-06-11 DIAGNOSIS — M5137 Other intervertebral disc degeneration, lumbosacral region: Secondary | ICD-10-CM | POA: Diagnosis not present

## 2019-06-11 DIAGNOSIS — M4300 Spondylolysis, site unspecified: Secondary | ICD-10-CM

## 2019-06-11 DIAGNOSIS — M4310 Spondylolisthesis, site unspecified: Secondary | ICD-10-CM

## 2019-06-11 DIAGNOSIS — M5416 Radiculopathy, lumbar region: Secondary | ICD-10-CM | POA: Diagnosis not present

## 2019-06-11 DIAGNOSIS — M5410 Radiculopathy, site unspecified: Secondary | ICD-10-CM | POA: Diagnosis not present

## 2019-06-11 DIAGNOSIS — M48061 Spinal stenosis, lumbar region without neurogenic claudication: Secondary | ICD-10-CM

## 2019-06-11 NOTE — Patient Instructions (Signed)

## 2019-06-11 NOTE — Progress Notes (Signed)
Pain relief after procedure (treated area only): (Questions asked to patient) 1. About 15 minutes after the procedure, while the area was still numb from the local anesthetics, were you having any pain in the area? First 1 hour: 100 % better. Initial 4-6 hours: 100 % better. 2. How many days was the area numb? Any benefit longer than 6 hours: 100% better. 3. How much better is your pain now, when compared to before the procedure? Current benefit: 50 % better. 4. Can you move better now? Improvement in ROM (Range of Motion): Yes. 5. Can you do more now? Improvement in function: up to a point. 4. Did you have any problems with the procedure? Side-effects/Complications: No.

## 2019-07-03 ENCOUNTER — Ambulatory Visit (HOSPITAL_BASED_OUTPATIENT_CLINIC_OR_DEPARTMENT_OTHER): Payer: Medicare Other | Admitting: Pain Medicine

## 2019-07-03 ENCOUNTER — Encounter: Payer: Self-pay | Admitting: Pain Medicine

## 2019-07-03 ENCOUNTER — Ambulatory Visit
Admission: RE | Admit: 2019-07-03 | Discharge: 2019-07-03 | Disposition: A | Discharge status: Home / Self Care | Payer: Medicare Other | Source: Ambulatory Visit | Attending: Pain Medicine | Admitting: Pain Medicine

## 2019-07-03 ENCOUNTER — Other Ambulatory Visit: Payer: Self-pay

## 2019-07-03 VITALS — BP 144/71 | HR 59 | Temp 98.2°F | Resp 15 | Ht 68.0 in | Wt 250.0 lb

## 2019-07-03 DIAGNOSIS — M5442 Lumbago with sciatica, left side: Secondary | ICD-10-CM | POA: Diagnosis not present

## 2019-07-03 DIAGNOSIS — M4310 Spondylolisthesis, site unspecified: Secondary | ICD-10-CM

## 2019-07-03 DIAGNOSIS — G8929 Other chronic pain: Secondary | ICD-10-CM | POA: Diagnosis not present

## 2019-07-03 DIAGNOSIS — M47816 Spondylosis without myelopathy or radiculopathy, lumbar region: Secondary | ICD-10-CM

## 2019-07-03 DIAGNOSIS — M5137 Other intervertebral disc degeneration, lumbosacral region: Secondary | ICD-10-CM | POA: Insufficient documentation

## 2019-07-03 DIAGNOSIS — M5416 Radiculopathy, lumbar region: Secondary | ICD-10-CM

## 2019-07-03 DIAGNOSIS — M5410 Radiculopathy, site unspecified: Secondary | ICD-10-CM | POA: Diagnosis not present

## 2019-07-03 DIAGNOSIS — M48061 Spinal stenosis, lumbar region without neurogenic claudication: Secondary | ICD-10-CM | POA: Insufficient documentation

## 2019-07-03 DIAGNOSIS — M4300 Spondylolysis, site unspecified: Secondary | ICD-10-CM | POA: Diagnosis not present

## 2019-07-03 DIAGNOSIS — M79605 Pain in left leg: Secondary | ICD-10-CM | POA: Diagnosis not present

## 2019-07-03 MED ORDER — SODIUM CHLORIDE 0.9% FLUSH
2.0000 mL | Freq: Once | INTRAVENOUS | Status: AC
  Administered 2019-07-03: 2 mL

## 2019-07-03 MED ORDER — ROPIVACAINE HCL 2 MG/ML IJ SOLN
2.0000 mL | Freq: Once | INTRAMUSCULAR | Status: AC
  Administered 2019-07-03: 2 mL via EPIDURAL

## 2019-07-03 MED ORDER — LACTATED RINGERS IV SOLN
1000.0000 mL | Freq: Once | INTRAVENOUS | Status: AC
  Administered 2019-07-03: 1000 mL via INTRAVENOUS

## 2019-07-03 MED ORDER — IOHEXOL 180 MG/ML  SOLN
INTRAMUSCULAR | Status: AC
  Filled 2019-07-03: qty 20

## 2019-07-03 MED ORDER — TRIAMCINOLONE ACETONIDE 40 MG/ML IJ SUSP
40.0000 mg | Freq: Once | INTRAMUSCULAR | Status: AC
  Administered 2019-07-03: 40 mg

## 2019-07-03 MED ORDER — MIDAZOLAM HCL 5 MG/5ML IJ SOLN
1.0000 mg | INTRAMUSCULAR | Status: DC | PRN
  Administered 2019-07-03: 1 mg via INTRAVENOUS
  Filled 2019-07-03: qty 5

## 2019-07-03 MED ORDER — ROPIVACAINE HCL 2 MG/ML IJ SOLN
INTRAMUSCULAR | Status: AC
  Filled 2019-07-03: qty 10

## 2019-07-03 MED ORDER — FENTANYL CITRATE (PF) 100 MCG/2ML IJ SOLN
25.0000 ug | INTRAMUSCULAR | Status: DC | PRN
  Administered 2019-07-03: 50 ug via INTRAVENOUS
  Filled 2019-07-03: qty 2

## 2019-07-03 MED ORDER — LIDOCAINE HCL 2 % IJ SOLN
20.0000 mL | Freq: Once | INTRAMUSCULAR | Status: AC
  Administered 2019-07-03: 400 mg

## 2019-07-03 MED ORDER — IOHEXOL 180 MG/ML  SOLN
10.0000 mL | Freq: Once | INTRAMUSCULAR | Status: AC
  Administered 2019-07-03: 10 mL via EPIDURAL

## 2019-07-03 MED ORDER — SODIUM CHLORIDE (PF) 0.9 % IJ SOLN
INTRAMUSCULAR | Status: AC
  Filled 2019-07-03: qty 10

## 2019-07-03 MED ORDER — LIDOCAINE HCL 2 % IJ SOLN
INTRAMUSCULAR | Status: AC
  Filled 2019-07-03: qty 20

## 2019-07-03 MED ORDER — TRIAMCINOLONE ACETONIDE 40 MG/ML IJ SUSP
INTRAMUSCULAR | Status: AC
  Filled 2019-07-03: qty 1

## 2019-07-03 NOTE — Progress Notes (Signed)
Safety precautions to be maintained throughout the outpatient stay will include: orient to surroundings, keep bed in low position, maintain call bell within reach at all times, provide assistance with transfer out of bed and ambulation.  

## 2019-07-03 NOTE — Progress Notes (Signed)
Patient's Name: Hector [name]  MRN: [id number]  Referring Provider: Milinda Pointer, MD  DOB: January 01, 1935  PCP: Einar Pheasant, MD  DOS: 07/03/2019  Note by: Gaspar Cola, MD  Service setting: Ambulatory outpatient  Specialty: Interventional Pain Management  Patient type: Established  Location: [facility] (AMB) Pain Management Facility  Visit type: Interventional Procedure   Primary Reason for Visit: Interventional Pain Management Treatment. CC: Back Pain (lower)  Procedure:          Anesthesia, Analgesia, Anxiolysis:  Type: Diagnostic Epidural Steroid Injection #2  Region: Caudal Level: Sacrococcygeal   Laterality: Midline       Type: Moderate (Conscious) Sedation combined with Local Anesthesia Indication(s): Analgesia and Anxiety Route: Intravenous (IV) IV Access: Secured Sedation: Meaningful verbal contact was maintained at all times during the procedure  Local Anesthetic: Lidocaine 1-2%  Position: Prone   Indications: 1. DDD (degenerative disc disease), lumbosacral   2. L5-S1 bilateral pars defect with spondylolisthesis   3. Chronic Lumbar radiculitis (L5) (Left)   4. Chronic lower extremity radicular pain (L5) (Left)   5. Chronic lower extremity pain (Secondary Area of Pain) (Left)   6. Chronic low back pain (Primary Area of Pain) (Bilateral) (L>R)   7. Osteoarthritis of lumbar spine   8. Lumbosacral Grade 1 Anterolisthesis of L5 over S1    Pain Score: Pre-procedure: 1 /10 Post-procedure: 0-No pain/10   Pre-op Assessment:  Hector [name] is a 84 y.o. (year old), male patient, seen today for interventional treatment. He  has a past surgical history that includes Prostate surgery; cystocopy (2003); Hemorrhoid surgery; Cardiac catheterization; sleep study; Colon surgery (2006-2008-2011); Cataract Surgery (Right, 02/13/14); Esophagogastroduodenoscopy (egd) with propofol (N/A, 08/04/2016); Colonoscopy w/ polypectomy; and Colonoscopy with propofol (N/A, 02/15/2018). Hector [name] has a current  medication list which includes the following prescription(s): acetaminophen, amlodipine, aspirin, calcium plus d3 absorbable, fluticasone, hydrochlorothiazide, hyoscyamine, irbesartan, l-lysine, levothyroxine, omeprazole, tramadol, and valacyclovir, and the following Facility-Administered Medications: fentanyl and midazolam. His primarily concern today is the Back Pain (lower)  Initial Vital Signs:  Pulse/HCG Rate: (!) 59ECG Heart Rate: 79 Temp: 97.9 F (36.6 C) Resp: 18 BP: (!) 141/67 SpO2: 100 %  BMI: Estimated body mass index is 38.01 kg/m as calculated from the following:   Height as of this encounter: 5\' 8"  (1.727 m).   Weight as of this encounter: 250 lb (113.4 kg).  Risk Assessment: Allergies: Reviewed. He is allergic to tape.  Allergy Precautions: None required Coagulopathies: Reviewed. None identified.  Blood-thinner therapy: None at this time Active Infection(s): Reviewed. None identified. Hector [name] is afebrile  Site Confirmation: Hector [name] was asked to confirm the procedure and laterality before marking the site Procedure checklist: Completed Consent: Before the procedure and under the influence of no sedative(s), amnesic(s), or anxiolytics, the patient was informed of the treatment options, risks and possible complications. To fulfill our ethical and legal obligations, as recommended by the American Medical Association's Code of Ethics, I have informed the patient of my clinical impression; the nature and purpose of the treatment or procedure; the risks, benefits, and possible complications of the intervention; the alternatives, including doing nothing; the risk(s) and benefit(s) of the alternative treatment(s) or procedure(s); and the risk(s) and benefit(s) of doing nothing. The patient was provided information about the general risks and possible complications associated with the procedure. These may include, but are not limited to: failure to achieve desired goals, infection,  bleeding, organ or nerve damage, allergic reactions, paralysis, and death. In addition, the patient was informed  of those risks and complications associated to Spine-related procedures, such as failure to decrease pain; infection (i.e.: Meningitis, epidural or intraspinal abscess); bleeding (i.e.: epidural hematoma, subarachnoid hemorrhage, or any other type of intraspinal or peri-dural bleeding); organ or nerve damage (i.e.: Any type of peripheral nerve, nerve root, or spinal cord injury) with subsequent damage to sensory, motor, and/or autonomic systems, resulting in permanent pain, numbness, and/or weakness of one or several areas of the body; allergic reactions; (i.e.: anaphylactic reaction); and/or death. Furthermore, the patient was informed of those risks and complications associated with the medications. These include, but are not limited to: allergic reactions (i.e.: anaphylactic or anaphylactoid reaction(s)); adrenal axis suppression; blood sugar elevation that in diabetics may result in ketoacidosis or comma; water retention that in patients with history of congestive heart failure may result in shortness of breath, pulmonary edema, and decompensation with resultant heart failure; weight gain; swelling or edema; medication-induced neural toxicity; particulate matter embolism and blood vessel occlusion with resultant organ, and/or nervous system infarction; and/or aseptic necrosis of one or more joints. Finally, the patient was informed that Medicine is not an exact science; therefore, there is also the possibility of unforeseen or unpredictable risks and/or possible complications that may result in a catastrophic outcome. The patient indicated having understood very clearly. We have given the patient no guarantees and we have made no promises. Enough time was given to the patient to ask questions, all of which were answered to the patient's satisfaction. Hector [name] has indicated that he wanted to  continue with the procedure. Attestation: I, the ordering provider, attest that I have discussed with the patient the benefits, risks, side-effects, alternatives, likelihood of achieving goals, and potential problems during recovery for the procedure that I have provided informed consent. Date  Time: 07/03/2019  9:43 AM  Pre-Procedure Preparation:  Monitoring: As per clinic protocol. Respiration, ETCO2, SpO2, BP, heart rate and rhythm monitor placed and checked for adequate function Safety Precautions: Patient was assessed for positional comfort and pressure points before starting the procedure. Time-out: I initiated and conducted the "Time-out" before starting the procedure, as per protocol. The patient was asked to participate by confirming the accuracy of the "Time Out" information. Verification of the correct person, site, and procedure were performed and confirmed by me, the nursing staff, and the patient. "Time-out" conducted as per Joint Commission's Universal Protocol (UP.01.01.01). Time: 1011  Description of Procedure:          Target Area: Caudal Epidural Canal. Approach: Midline approach. Area Prepped: Entire Posterior Sacrococcygeal Region Prepping solution: DuraPrep (Iodine Povacrylex [0.7% available iodine] and Isopropyl Alcohol, 74% w/w) Safety Precautions: Aspiration looking for blood return was conducted prior to all injections. At no point did we inject any substances, as a needle was being advanced. No attempts were made at seeking any paresthesias. Safe injection practices and needle disposal techniques used. Medications properly checked for expiration dates. SDV (single dose vial) medications used. Description of the Procedure: Protocol guidelines were followed. The patient was placed in position over the fluoroscopy table. The target area was identified and the area prepped in the usual manner. Skin & deeper tissues infiltrated with local anesthetic. Appropriate amount of time  allowed to pass for local anesthetics to take effect. The procedure needles were then advanced to the target area. Proper needle placement secured. Negative aspiration confirmed. Solution injected in intermittent fashion, asking for systemic symptoms every 0.5cc of injectate. The needles were then removed and the area cleansed, making sure to leave  some of the prepping solution back to take advantage of its long term bactericidal properties. Vitals:   07/03/19 1017 07/03/19 1023 07/03/19 1033 07/03/19 1043  BP: (!) 156/82 (!) 148/69 (!) 143/68 (!) 144/71  Pulse:      Resp: 12 12 16 15   Temp:  97.7 F (36.5 C)  98.2 F (36.8 C)  TempSrc:      SpO2: 97% 96% 96% 96%  Weight:      Height:        Start Time: 1011 hrs. End Time: 1017 hrs. Materials:  Needle(s) Type: Epidural needle Gauge: 17G Length: 3.5-in Medication(s): Please see orders for medications and dosing details.  Imaging Guidance (Spinal):          Type of Imaging Technique: Fluoroscopy Guidance (Spinal) Indication(s): Assistance in needle guidance and placement for procedures requiring needle placement in or near specific anatomical locations not easily accessible without such assistance. Exposure Time: Please see nurses notes. Contrast: Before injecting any contrast, we confirmed that the patient did not have an allergy to iodine, shellfish, or radiological contrast. Once satisfactory needle placement was completed at the desired level, radiological contrast was injected. Contrast injected under live fluoroscopy. No contrast complications. See chart for type and volume of contrast used. Fluoroscopic Guidance: I was personally present during the use of fluoroscopy. "Tunnel Vision Technique" used to obtain the best possible view of the target area. Parallax error corrected before commencing the procedure. "Direction-depth-direction" technique used to introduce the needle under continuous pulsed fluoroscopy. Once target was reached,  antero-posterior, oblique, and lateral fluoroscopic projection used confirm needle placement in all planes. Images permanently stored in EMR. Interpretation: I personally interpreted the imaging intraoperatively. Adequate needle placement confirmed in multiple planes. Appropriate spread of contrast into desired area was observed. No evidence of afferent or efferent intravascular uptake. No intrathecal or subarachnoid spread observed. Permanent images saved into the patient's record.  Antibiotic Prophylaxis:   Anti-infectives (From admission, onward)   None     Indication(s): None identified  Post-operative Assessment:  Post-procedure Vital Signs:  Pulse/HCG Rate: (!) 59(!) 59 Temp: 98.2 F (36.8 C) Resp: 15 BP: (!) 144/71 SpO2: 96 %  EBL: None  Complications: No immediate post-treatment complications observed by team, or reported by patient.  Note: The patient tolerated the entire procedure well. A repeat set of vitals were taken after the procedure and the patient was kept under observation following institutional policy, for this type of procedure. Post-procedural neurological assessment was performed, showing return to baseline, prior to discharge. The patient was provided with post-procedure discharge instructions, including a section on how to identify potential problems. Should any problems arise concerning this procedure, the patient was given instructions to immediately contact us, at any time, without hesitation. In any case, we plan to contact the patient by telephone for a follow-up status report regarding this interventional procedure.  Comments:  No additional relevant information.  Plan of Care  Orders:  Orders Placed This Encounter  Procedures  . Caudal ESI (Today)    Scheduling Instructions:     Laterality: Midline     Level(s): Sacrococcygeal canal (Tailbone area)     Sedation: Patient's choice     Timeframe: Today    Order Specific Question:   Where will this  procedure be performed?    Answer:   [facility]  . Fluoro (C-Arm) (<60 min) (No Report)    Intraoperative interpretation by procedural physician at [place].    Standing Status:  Standing    Number of Occurrences:   1    Order Specific Question:   Reason for exam:    Answer:   Assistance in needle guidance and placement for procedures requiring needle placement in or near specific anatomical locations not easily accessible without such assistance.  . Consent: Caudal ESI    Nursing Order: Transcribe to consent form and obtain patient signature. Note: Always confirm laterality of pain with Mr. Raymo, before procedure. Procedure: Caudal epidural steroid injection Indication/Reason: Low back pain and lower extremity pain secondary to lumbosacral radiculitis Provider Attestation: I, Jackson Dossie Arbour, MD, (Pain Management Specialist), the physician/practitioner, attest that I have discussed with the patient the benefits, risks, side effects, alternatives, likelihood of achieving goals and potential problems during recovery for the procedure that I have provided informed consent.  Marland Kitchen Epidural Tray    Equipment required: Single use, disposable, "Epidural Tray" Epidural Catheter: NOT required    Standing Status:   Standing    Number of Occurrences:   1    Order Specific Question:   Specify    Answer:   Epidural Tray   Chronic Opioid Analgesic:  Tramadol 50 mg, 1 tab PO q 8 hrs (150 mg/day of tramadol) MME/day:15mg /day.   Medications ordered for procedure: Meds ordered this encounter  Medications  . iohexol (OMNIPAQUE) 180 MG/ML injection 10 mL    Must be Myelogram-compatible. If not available, you may substitute with a water-soluble, non-ionic, hypoallergenic, myelogram-compatible radiological contrast medium.  Marland Kitchen lidocaine (XYLOCAINE) 2 % (with pres) injection 400 mg  . lactated ringers infusion 1,000 mL  . midazolam (VERSED) 5 MG/5ML injection 1-2 mg    Make sure  Flumazenil is available in the pyxis when using this medication. If oversedation occurs, administer 0.2 mg IV over 15 sec. If after 45 sec no response, administer 0.2 mg again over 1 min; may repeat at 1 min intervals; not to exceed 4 doses (1 mg)  . fentaNYL (SUBLIMAZE) injection 25-50 mcg    Make sure Narcan is available in the pyxis when using this medication. In the event of respiratory depression (RR< 8/min): Titrate NARCAN (naloxone) in increments of 0.1 to 0.2 mg IV at 2-3 minute intervals, until desired degree of reversal.  . sodium chloride flush (NS) 0.9 % injection 2 mL  . ropivacaine (PF) 2 mg/mL (0.2%) (NAROPIN) injection 2 mL  . triamcinolone acetonide (KENALOG-40) injection 40 mg   Medications administered: We administered iohexol, lidocaine, lactated ringers, midazolam, fentaNYL, sodium chloride flush, ropivacaine (PF) 2 mg/mL (0.2%), and triamcinolone acetonide.  See the medical record for exact dosing, route, and time of administration.  Follow-up plan:   Return in about 2 weeks (around 07/17/2019) for (VV), (PP).       Interventional management options: Planned, scheduled, and/or pending:      Considering:   Diagnostic left L5 TFESI  Possible left L5 nerve root ganglion RFA Diagnosticleft L4-5 LESI   Palliative PRN treatment(s):   Diagnostic midline caudal ESI #3  Palliativebilateral lumbar facet block #3 Palliative right-sided lumbar facet RFA #2 (last done 04/18/2018)  Palliative left-sided lumbar facet RFA #2 (last done 03/21/2018)     Recent Visits Date Type Provider Dept  06/11/19 Telemedicine Milinda Pointer, MD [facility]  05/24/19 Procedure visit Milinda Pointer, MD [facility]  05/16/19 Office Visit Milinda Pointer, MD [facility]  Showing recent visits within past 90 days and meeting all other requirements   Today's Visits Date Type Provider Dept  07/03/19 Procedure  visit Milinda Pointer, MD [facility]  Showing today's visits and meeting all other requirements   Future Appointments Date Type Provider Dept  07/16/19 Appointment Milinda Pointer, MD [facility]  Showing future appointments within next 90 days and meeting all other requirements   Disposition: Discharge home  Discharge Date & Time: 07/03/2019; 1046 hrs.   Primary Care Physician: Einar Pheasant, MD Location: [facility] Note by: Gaspar Cola, MD Date: 07/03/2019; Time: 10:46 AM  Disclaimer:  Medicine is not an exact science. The only guarantee in medicine is that nothing is guaranteed. It is important to note that the decision to proceed with this intervention was based on the information collected from the patient. The Data and conclusions were drawn from the patient's questionnaire, the interview, and the physical examination. Because the information was provided in large part by the patient, it cannot be guaranteed that it has not been purposely or unconsciously manipulated. Every effort has been made to obtain as much relevant data as possible for this evaluation. It is important to note that the conclusions that lead to this procedure are derived in large part from the available data. Always take into account that the treatment will also be dependent on availability of resources and existing treatment guidelines, considered by other Pain Management Practitioners as being common knowledge and practice, at the time of the intervention. For Medico-Legal purposes, it is also important to point out that variation in procedural techniques and pharmacological choices are the acceptable norm. The indications, contraindications, technique, and results of the above procedure should only be interpreted and judged by a Board-Certified Interventional Pain Specialist with extensive familiarity and expertise in the same exact procedure and technique.

## 2019-07-03 NOTE — Patient Instructions (Addendum)

## 2019-07-04 ENCOUNTER — Telehealth: Payer: Self-pay

## 2019-07-04 NOTE — Telephone Encounter (Signed)
Post procedure phone call. Patient states he is doing well.  

## 2019-07-12 ENCOUNTER — Encounter: Payer: Self-pay | Admitting: Pain Medicine

## 2019-07-12 NOTE — Progress Notes (Signed)
Pain relief after procedure (treated area only): (Questions asked to patient) 1. Starting about 15 minutes after the procedure, and "while the area was still numb" (from the local anesthetics), were you having any of your usual pain "in that area" (the treated area)?  (NOTE: NOT including the discomfort from the needle sticks.) First 1 hour: 100% better. First 4-6 hours: 100 % better. 2. Assuming that it did get numb. How long was the area numb? 90% benefit, longer than 6 hours. How long? 12. 3. How much better is your pain now, when compared to before the procedure? Current benefit: 80 % better. 4. Can you move better now? Improvement in ROM (Range of Motion): Yes. 5. Can you do more now? Improvement in function: Yes. 4. Did you have any problems with the procedure? Side-effects/Complications: No.

## 2019-07-15 NOTE — Progress Notes (Signed)
Pain Management Virtual Encounter Note - Virtual Visit via Telephone Telehealth (real-time audio visits between healthcare provider and patient).   Patient's Phone No. & Preferred Pharmacy:  [phone] (home); [phone] (mobile); (Preferred) [phone] [email or web address]  [facility] [place], [place] - [address] [place] [address] Phone: [phone] Fax: [phone]    Pre-screening note:  Our staff contacted Mr. Dotts and offered him an "in person", "face-to-face" appointment versus a telephone encounter. He indicated preferring the telephone encounter, at this time.   Reason for Virtual Visit: COVID-19*  Social distancing based on CDC and AMA recommendations.   I contacted [name] on 07/16/2019 via telephone.      I clearly identified myself as Gaspar Cola, MD. I verified that I was speaking with the correct person using two identifiers (Name: ABHI QUINT, and date of birth: [birth year]).  Advanced Informed Consent I sought verbal advanced consent from [name] for virtual visit interactions. I informed Mr. Portales of possible security and privacy concerns, risks, and limitations associated with providing "not-in-person" medical evaluation and management services. I also informed Mr. Hoffmann of the availability of "in-person" appointments. Finally, I informed him that there would be a charge for the virtual visit and that he could be  personally, fully or partially, financially responsible for it. Mr. Wiklund expressed understanding and agreed to proceed.   Historic Elements   Mr. MELVINE PHANEUF is a 84 y.o. year old, male patient evaluated today after his last encounter by our practice on 07/04/2019. Mr. Caranci  has a past medical history of Acute postoperative pain (03/21/2018), Anemia, Arthritis, Barrett esophagus, Cancer ([place]), Chicken pox, Diverticulitis, Dysrhythmia, GERD (gastroesophageal reflux disease), Hyperlipidemia,  Hypertension, Hypothyroidism, Melanoma ([place]), Sleep apnea, and Ulcer. He also  has a past surgical history that includes Prostate surgery; cystocopy (2003); Hemorrhoid surgery; Cardiac catheterization; sleep study; Colon surgery (2006-2008-2011); Cataract Surgery (Right, 02/13/14); Esophagogastroduodenoscopy (egd) with propofol (N/A, 08/04/2016); Colonoscopy w/ polypectomy; and Colonoscopy with propofol (N/A, 02/15/2018). Mr. Rittel has a current medication list which includes the following prescription(s): acetaminophen, amlodipine, aspirin, calcium plus d3 absorbable, fluticasone, hydrochlorothiazide, hyoscyamine, irbesartan, l-lysine, levothyroxine, omeprazole, tramadol, and valacyclovir. He  reports that he has never smoked. He has never used smokeless tobacco. He reports that he does not drink alcohol or use drugs. Mr. Schooler is allergic to tape.   HPI  Today, he is being contacted for a post-procedure assessment.  The patient indicates having done extremely well after this caudal epidural steroid injection.  In view of this, I will go ahead and enter a PRN order for the same procedure, should he need it again.  Post-Procedure Evaluation  Procedure: Therapeutic midline caudal ESI #2 under fluoroscopic guidance and IV sedation Pre-procedure pain level:  1/10 Post-procedure: 0/10 (100% relief)  Sedation: Sedation provided.  Dewayne Shorter, RN  07/12/2019 11:15 AM  Signed Pain relief after procedure (treated area only): (Questions asked to patient) 1. Starting about 15 minutes after the procedure, and "while the area was still numb" (from the local anesthetics), were you having any of your usual pain "in that area" (the treated area)?  (NOTE: NOT including the discomfort from the needle sticks.) First 1 hour: 100% better. First 4-6 hours: 100 % better. 2. Assuming that it did get numb. How long was the area numb? 90% benefit, longer than 6 hours. How long? 12. 3. How much better is your pain now, when  compared to before the procedure? Current benefit: 80 %  better. 4. Can you move better now? Improvement in ROM (Range of Motion): Yes. 5. Can you do more now? Improvement in function: Yes. 4. Did you have any problems with the procedure? Side-effects/Complications: No.  Current benefits: Defined as benefit that persist at this time.   Analgesia:  >75% relief Function: Mr. Alworth reports improvement in function ROM: Mr. Wirkkala reports improvement in ROM  Pharmacotherapy Assessment  Analgesic: Tramadol 50 mg, 1 tab PO q 8 hrs (150 mg/day of tramadol) MME/day:15mg /day.   Monitoring: Pharmacotherapy: No side-effects or adverse reactions reported. [place] PMP: PDMP reviewed during this encounter.       Compliance: No problems identified. Effectiveness: Clinically acceptable. Plan: Refer to "POC".  UDS: No results found for: SUMMARY Laboratory Chemistry Profile (12 mo)  Renal: 10/10/2018: BUN 23; Creatinine, Ser 1.42  Lab Results  Component Value Date   GFR 47.81 (L) 10/10/2018   GFRAA >60 01/23/2018   GFRNONAA 56 (L) 01/23/2018   Hepatic: 08/03/2018: Albumin 4.2 Lab Results  Component Value Date   AST 27 08/03/2018   ALT 28 08/03/2018   Other: No results found for requested labs within last 8760 hours. Note: Above Lab results reviewed.  Imaging  Fluoro (C-Arm) (<60 min) (No Report) Fluoro was used, but no Radiologist interpretation will be provided.  Please refer to "NOTES" tab for provider progress note.   Assessment  The primary encounter diagnosis was Chronic Lumbar radiculitis (L5) (Left). Diagnoses of L5-S1 bilateral pars defect with spondylolisthesis, Chronic lower extremity radicular pain (L5) (Left), Chronic lower extremity pain (Secondary Area of Pain) (Left), and Chronic low back pain (Primary Area of Pain) (Bilateral) (L>R) were also pertinent to this visit.  Plan of Care  Problem-specific:  No problem-specific Assessment & Plan notes found for this encounter.  I  am having Symere A. Kishimoto maintain his aspirin, hyoscyamine, acetaminophen, L-Lysine, fluticasone, Calcium Plus D3 Absorbable, traMADol, omeprazole, levothyroxine, valACYclovir, hydrochlorothiazide, irbesartan, and amLODipine.  Pharmacotherapy (Medications Ordered): No orders of the defined types were placed in this encounter.  Orders:  Orders Placed This Encounter  Procedures  . Caudal ESI (PRN)    Standing Status:   Standing    Number of Occurrences:   1    Standing Expiration Date:   07/15/2020    Scheduling Instructions:     Laterality: Midline     Level(s): Sacrococcygeal canal (Tailbone area)     Sedation: Patient's choice      TIMEFRAME: PRN procedure. (Mr. Farrer will call when needed.)    Order Specific Question:   Where will this procedure be performed?    Answer:   [facility]   Follow-up plan:   Return for keep previously scheduled appointment.      Interventional management options: Planned, scheduled, and/or pending:      Considering:   Diagnostic left L5 TFESI  Possible left L5 nerve root ganglion RFA Diagnosticleft L4-5 LESI   Palliative PRN treatment(s):   Diagnostic midline caudal ESI #3  Palliativebilateral lumbar facet block #3 Palliative right-sided lumbar facet RFA #2 (last done 04/18/2018)  Palliative left-sided lumbar facet RFA #2 (last done 03/21/2018)     Recent Visits Date Type Provider Dept  07/03/19 Procedure visit Milinda Pointer, MD [facility]  06/11/19 Telemedicine Milinda Pointer, MD [facility]  05/24/19 Procedure visit Milinda Pointer, MD [facility]  05/16/19 Office Visit Milinda Pointer, MD [facility]  Showing recent visits within past 90 days and meeting all other requirements   Today's Visits  Date Type Provider Dept  07/16/19 Telemedicine Milinda Pointer, MD [facility]  Showing today's visits and meeting all other requirements   Future Appointments No  visits were found meeting these conditions.  Showing future appointments within next 90 days and meeting all other requirements   I discussed the assessment and treatment plan with the patient. The patient was provided an opportunity to ask questions and all were answered. The patient agreed with the plan and demonstrated an understanding of the instructions.  Patient advised to call back or seek an in-person evaluation if the symptoms or condition worsens.  Total duration of non-face-to-face encounter: 12 minutes.  Note by: Gaspar Cola, MD Date: 07/16/2019; Time: 8:35 AM  Note: This dictation was prepared with Dragon dictation. Any transcriptional errors that may result from this process are unintentional.  Disclaimer:  * Given the special circumstances of the COVID-19 pandemic, the federal government has announced that the Office for Civil Rights (OCR) will exercise its enforcement discretion and will not impose penalties on physicians using telehealth in the event of noncompliance with regulatory requirements under the [place] (HIPAA) in connection with the good faith provision of telehealth during the XX123456 national public health emergency. ([place])

## 2019-07-16 ENCOUNTER — Ambulatory Visit: Payer: Medicare Other | Attending: Pain Medicine | Admitting: Pain Medicine

## 2019-07-16 ENCOUNTER — Other Ambulatory Visit: Payer: Self-pay

## 2019-07-16 DIAGNOSIS — M4300 Spondylolysis, site unspecified: Secondary | ICD-10-CM | POA: Diagnosis not present

## 2019-07-16 DIAGNOSIS — M5442 Lumbago with sciatica, left side: Secondary | ICD-10-CM | POA: Diagnosis not present

## 2019-07-16 DIAGNOSIS — M5410 Radiculopathy, site unspecified: Secondary | ICD-10-CM

## 2019-07-16 DIAGNOSIS — M79605 Pain in left leg: Secondary | ICD-10-CM | POA: Diagnosis not present

## 2019-07-16 DIAGNOSIS — M5416 Radiculopathy, lumbar region: Secondary | ICD-10-CM | POA: Diagnosis not present

## 2019-07-16 DIAGNOSIS — M4310 Spondylolisthesis, site unspecified: Secondary | ICD-10-CM

## 2019-07-16 DIAGNOSIS — G8929 Other chronic pain: Secondary | ICD-10-CM

## 2019-07-16 NOTE — Patient Instructions (Signed)
____________________________________________________________________________________________  Preparing for Procedure with Sedation  Procedure appointments are limited to planned procedures: . No Prescription Refills. . No disability issues will be discussed. . No medication changes will be discussed.  Instructions: . Oral Intake: Do not eat or drink anything for at least 8 hours prior to your procedure. . Transportation: Public transportation is not allowed. Bring an adult driver. The driver must be physically present in our waiting room before any procedure can be started. . Physical Assistance: Bring an adult physically capable of assisting you, in the event you need help. This adult should keep you company at home for at least 6 hours after the procedure. . Blood Pressure Medicine: Take your blood pressure medicine with a sip of water the morning of the procedure. . Blood thinners: Notify our staff if you are taking any blood thinners. Depending on which one you take, there will be specific instructions on how and when to stop it. . Diabetics on insulin: Notify the staff so that you can be scheduled 1st case in the morning. If your diabetes requires high dose insulin, take only  of your normal insulin dose the morning of the procedure and notify the staff that you have done so. . Preventing infections: Shower with an antibacterial soap the morning of your procedure. . Build-up your immune system: Take 1000 mg of Vitamin C with every meal (3 times a day) the day prior to your procedure. . Antibiotics: Inform the staff if you have a condition or reason that requires you to take antibiotics before dental procedures. . Pregnancy: If you are pregnant, call and cancel the procedure. . Sickness: If you have a cold, fever, or any active infections, call and cancel the procedure. . Arrival: You must be in the facility at least 30 minutes prior to your scheduled procedure. . Children: Do not bring  children with you. . Dress appropriately: Bring dark clothing that you would not mind if they get stained. . Valuables: Do not bring any jewelry or valuables.  Reasons to call and reschedule or cancel your procedure: (Following these recommendations will minimize the risk of a serious complication.) . Surgeries: Avoid having procedures within 2 weeks of any surgery. (Avoid for 2 weeks before or after any surgery). . Flu Shots: Avoid having procedures within 2 weeks of a flu shots or . (Avoid for 2 weeks before or after immunizations). . Barium: Avoid having a procedure within 7-10 days after having had a radiological study involving the use of radiological contrast. (Myelograms, Barium swallow or enema study). . Heart attacks: Avoid any elective procedures or surgeries for the initial 6 months after a "Myocardial Infarction" (Heart Attack). . Blood thinners: It is imperative that you stop these medications before procedures. Let us know if you if you take any blood thinner.  . Infection: Avoid procedures during or within two weeks of an infection (including chest colds or gastrointestinal problems). Symptoms associated with infections include: Localized redness, fever, chills, night sweats or profuse sweating, burning sensation when voiding, cough, congestion, stuffiness, runny nose, sore throat, diarrhea, nausea, vomiting, cold or Flu symptoms, recent or current infections. It is specially important if the infection is over the area that we intend to treat. . Heart and lung problems: Symptoms that may suggest an active cardiopulmonary problem include: cough, chest pain, breathing difficulties or shortness of breath, dizziness, ankle swelling, uncontrolled high or unusually low blood pressure, and/or palpitations. If you are experiencing any of these symptoms, cancel your procedure and contact   your primary care physician for an evaluation.  Remember:  Regular Business hours are:  Monday to Thursday  8:00 AM to 4:00 PM  Provider's Schedule: Virlee Stroschein, MD:  Procedure days: Tuesday and Thursday 7:30 AM to 4:00 PM  Bilal Lateef, MD:  Procedure days: Monday and Wednesday 7:30 AM to 4:00 PM ____________________________________________________________________________________________   ____________________________________________________________________________________________  General Risks and Possible Complications  Patient Responsibilities: It is important that you read this as it is part of your informed consent. It is our duty to inform you of the risks and possible complications associated with treatments offered to you. It is your responsibility as a patient to read this and to ask questions about anything that is not clear or that you believe was not covered in this document.  Patient's Rights: You have the right to refuse treatment. You also have the right to change your mind, even after initially having agreed to have the treatment done. However, under this last option, if you wait until the last second to change your mind, you may be charged for the materials used up to that point.  Introduction: Medicine is not an exact science. Everything in Medicine, including the lack of treatment(s), carries the potential for danger, harm, or loss (which is by definition: Risk). In Medicine, a complication is a secondary problem, condition, or disease that can aggravate an already existing one. All treatments carry the risk of possible complications. The fact that a side effects or complications occurs, does not imply that the treatment was conducted incorrectly. It must be clearly understood that these can happen even when everything is done following the highest safety standards.  No treatment: You can choose not to proceed with the proposed treatment alternative. The "PRO(s)" would include: avoiding the risk of complications associated with the therapy. The "CON(s)" would include: not  getting any of the treatment benefits. These benefits fall under one of three categories: diagnostic; therapeutic; and/or palliative. Diagnostic benefits include: getting information which can ultimately lead to improvement of the disease or symptom(s). Therapeutic benefits are those associated with the successful treatment of the disease. Finally, palliative benefits are those related to the decrease of the primary symptoms, without necessarily curing the condition (example: decreasing the pain from a flare-up of a chronic condition, such as incurable terminal cancer).  General Risks and Complications: These are associated to most interventional treatments. They can occur alone, or in combination. They fall under one of the following six (6) categories: no benefit or worsening of symptoms; bleeding; infection; nerve damage; allergic reactions; and/or death. 1. No benefits or worsening of symptoms: In Medicine there are no guarantees, only probabilities. No healthcare provider can ever guarantee that a medical treatment will work, they can only state the probability that it may. Furthermore, there is always the possibility that the condition may worsen, either directly, or indirectly, as a consequence of the treatment. 2. Bleeding: This is more common if the patient is taking a blood thinner, either prescription or over the counter (example: Goody Powders, Fish oil, Aspirin, Garlic, etc.), or if suffering a condition associated with impaired coagulation (example: Hemophilia, cirrhosis of the liver, low platelet counts, etc.). However, even if you do not have one on these, it can still happen. If you have any of these conditions, or take one of these drugs, make sure to notify your treating physician. 3. Infection: This is more common in patients with a compromised immune system, either due to disease (example: diabetes, cancer, human immunodeficiency virus [HIV],   etc.), or due to medications or treatments  (example: therapies used to treat cancer and rheumatological diseases). However, even if you do not have one on these, it can still happen. If you have any of these conditions, or take one of these drugs, make sure to notify your treating physician. 4. Nerve Damage: This is more common when the treatment is an invasive one, but it can also happen with the use of medications, such as those used in the treatment of cancer. The damage can occur to small secondary nerves, or to large primary ones, such as those in the spinal cord and brain. This damage may be temporary or permanent and it may lead to impairments that can range from temporary numbness to permanent paralysis and/or brain death. 5. Allergic Reactions: Any time a substance or material comes in contact with our body, there is the possibility of an allergic reaction. These can range from a mild skin rash (contact dermatitis) to a severe systemic reaction (anaphylactic reaction), which can result in death. 6. Death: In general, any medical intervention can result in death, most of the time due to an unforeseen complication. ____________________________________________________________________________________________  ____________________________________________________________________________________________  Blood Thinners  Recommended Time Interval Before and After Neuraxial Block or Catheter Removal  Drug (Generic) Brand Name Time Before Time After Comments  Abciximab Reopro 15 days 2 hours   Alteplase Activase 10 days 10 days   Apixaban Eliquis 3 days 6 hours   Aspirin > 325 mg Goody Powders/Excedrin 11 days  (Usually not stopped)  Aspirin ? 81 mg  7 days  (Usually not stopped)  Cholesterol Medication Lipitor 4 days    Cilostazol Pletal 3 days 5 hours   Clopidogrel Plavix 7-10 days 2 hours   Dabigatran Pradaxa 5 days 6 hours   Delteparin Fragmin 24 hours 4 hours   Dipyridamole + ASA Aggrenox 11days 2 hours   Enoxaparin  Lovenox 24  hours 4 hours   Eptifibatide Integrillin 8 hours 2 hours   Fish oil  4 days    Fondaparinux  Arixtra 72 hours 12 hours   Garlic supplements  7 days    Ginkgo biloba  36 hours    Ginseng  24 hours    Heparin (IV)  4 hours 2 hours   Heparin ([place])  12 hours 2 hours   Hydroxychloroquine Plaquenil 11 days    LMW Heparin  24 hours    LMWH  24 hours    NSAIDs  3 days  (Usually not stopped)  Prasugrel Effient 7-10 days 6 hours   Reteplase Retavase 10 days 10 days   Rivaroxaban Xarelto 3 days 6 hours   Streptokinase Streptase 10 days 10 days   Tenecteplase TNKase 10 days 10 days   Thrombolytics  10 days  10 days Avoid x 10 days after inj.  Ticagrelor Brilinta 5-7 days 6 hours   Ticlodipine Ticlid 10-14 days 2 hours   Tinzaparin Innohep 24 hours 4 hours   Tirofiban Aggrastat 8 hours 2 hours   Vitamin E  4 days    Warfarin Coumadin 5 days 2 hours   ____________________________________________________________________________________________

## 2019-09-14 ENCOUNTER — Other Ambulatory Visit: Payer: Self-pay

## 2019-09-14 ENCOUNTER — Other Ambulatory Visit (INDEPENDENT_AMBULATORY_CARE_PROVIDER_SITE_OTHER): Payer: Medicare Other

## 2019-09-14 DIAGNOSIS — E7800 Pure hypercholesterolemia, unspecified: Secondary | ICD-10-CM | POA: Diagnosis not present

## 2019-09-14 DIAGNOSIS — I10 Essential (primary) hypertension: Secondary | ICD-10-CM | POA: Diagnosis not present

## 2019-09-14 DIAGNOSIS — R739 Hyperglycemia, unspecified: Secondary | ICD-10-CM | POA: Diagnosis not present

## 2019-09-14 DIAGNOSIS — E039 Hypothyroidism, unspecified: Secondary | ICD-10-CM

## 2019-09-14 LAB — BASIC METABOLIC PANEL
BUN: 16 mg/dL (ref 6–23)
CO2: 32 mEq/L (ref 19–32)
Calcium: 9.7 mg/dL (ref 8.4–10.5)
Chloride: 102 mEq/L (ref 96–112)
Creatinine, Ser: 1.23 mg/dL (ref 0.40–1.50)
GFR: 56.3 mL/min — ABNORMAL LOW (ref >60.00)
Glucose, Bld: 87 mg/dL (ref 70–99)
Potassium: 3.9 mEq/L (ref 3.5–5.1)
Sodium: 141 mEq/L (ref 135–145)

## 2019-09-14 LAB — CBC WITH DIFFERENTIAL/PLATELET
Basophils Absolute: 0.1 10*3/uL (ref 0.0–0.1)
Basophils Relative: 1 % (ref 0.0–3.0)
Eosinophils Absolute: 0.2 10*3/uL (ref 0.0–0.7)
Eosinophils Relative: 2.5 % (ref 0.0–5.0)
HCT: 42.7 % (ref 39.0–52.0)
Hemoglobin: 14.5 g/dL (ref 13.0–17.0)
Lymphocytes Relative: 30.6 % (ref 12.0–46.0)
Lymphs Abs: 2.6 10*3/uL (ref 0.7–4.0)
MCHC: 34 g/dL (ref 30.0–36.0)
MCV: 95.4 fl (ref 78.0–100.0)
Monocytes Absolute: 1 10*3/uL (ref 0.1–1.0)
Monocytes Relative: 11.5 % (ref 3.0–12.0)
Neutro Abs: 4.6 10*3/uL (ref 1.4–7.7)
Neutrophils Relative %: 54.4 % (ref 43.0–77.0)
Platelets: 234 10*3/uL (ref 150.0–400.0)
RBC: 4.48 Mil/uL (ref 4.22–5.81)
RDW: 13.3 % (ref 11.5–15.5)
WBC: 8.5 10*3/uL (ref 4.0–10.5)

## 2019-09-14 LAB — LIPID PANEL
Cholesterol: 197 mg/dL (ref 0–200)
HDL: 58.3 mg/dL (ref >39.00)
LDL Cholesterol: 101 mg/dL — ABNORMAL HIGH (ref 0–99)
NonHDL: 138.3
Total CHOL/HDL Ratio: 3
Triglycerides: 185 mg/dL — ABNORMAL HIGH (ref 0.0–149.0)
VLDL: 37 mg/dL (ref 0.0–40.0)

## 2019-09-14 LAB — HEPATIC FUNCTION PANEL
ALT: 31 U/L (ref 0–53)
AST: 25 U/L (ref 0–37)
Albumin: 4.2 g/dL (ref 3.5–5.2)
Alkaline Phosphatase: 55 U/L (ref 39–117)
Bilirubin, Direct: 0.1 mg/dL (ref 0.0–0.3)
Total Bilirubin: 0.6 mg/dL (ref 0.2–1.2)
Total Protein: 7 g/dL (ref 6.0–8.3)

## 2019-09-14 LAB — HEMOGLOBIN A1C: Hgb A1c MFr Bld: 5.8 % (ref 4.6–6.5)

## 2019-09-14 LAB — TSH: TSH: 3.89 u[IU]/mL (ref 0.35–4.50)

## 2019-09-17 ENCOUNTER — Other Ambulatory Visit: Payer: Self-pay

## 2019-09-17 ENCOUNTER — Ambulatory Visit (INDEPENDENT_AMBULATORY_CARE_PROVIDER_SITE_OTHER): Payer: Medicare Other | Admitting: Internal Medicine

## 2019-09-17 ENCOUNTER — Encounter: Payer: Self-pay | Admitting: Internal Medicine

## 2019-09-17 VITALS — BP 126/68 | HR 53 | Ht 68.0 in | Wt 250.0 lb

## 2019-09-17 DIAGNOSIS — Z8546 Personal history of malignant neoplasm of prostate: Secondary | ICD-10-CM

## 2019-09-17 DIAGNOSIS — I10 Essential (primary) hypertension: Secondary | ICD-10-CM | POA: Diagnosis not present

## 2019-09-17 DIAGNOSIS — E7800 Pure hypercholesterolemia, unspecified: Secondary | ICD-10-CM

## 2019-09-17 DIAGNOSIS — C439 Malignant melanoma of skin, unspecified: Secondary | ICD-10-CM

## 2019-09-17 DIAGNOSIS — K2270 Barrett's esophagus without dysplasia: Secondary | ICD-10-CM | POA: Diagnosis not present

## 2019-09-17 DIAGNOSIS — E039 Hypothyroidism, unspecified: Secondary | ICD-10-CM

## 2019-09-17 DIAGNOSIS — Z86010 Personal history of colonic polyps: Secondary | ICD-10-CM

## 2019-09-17 DIAGNOSIS — M47816 Spondylosis without myelopathy or radiculopathy, lumbar region: Secondary | ICD-10-CM

## 2019-09-17 MED ORDER — ROSUVASTATIN CALCIUM 5 MG PO TABS: 5.0000 mg | ORAL_TABLET | Freq: Every day | ORAL | 2 refills | Status: DC

## 2019-09-17 NOTE — Progress Notes (Signed)
[name], male   DOB: [birth year], 85 y.o.   MRN: [id number]   Virtual Visit via telephone Note  This visit type was conducted due to national recommendations for restrictions regarding the COVID-19 pandemic (e.g. social distancing).  This format is felt to be most appropriate for this patient at this time.  All issues noted in this document were discussed and addressed.  No physical exam was performed (except for noted visual exam findings with Video Visits).   I connected with Hector [name] by telephone and verified that I am speaking with the correct person using two identifiers. Location patient: home Location provider: work Persons participating in the telephone visit: patient, provider  The limitations, risks, security and privacy concerns of performing an evaluation and management service by telephone and the availability of in person appointments have been discussed. The patient expressed understanding and agreed to proceed.   Reason for visit: scheduled follow up.   HPI: He reports he is doing relatively well.  Able to get around home without significant difficulty.  No chest pain reported.  Breathing stable. Taking omeprazole bid.  Controls acid reflux.  Blood pressure ok.  Still with persistent back pain.  S/p injection - pain clinic.  Helped.  Will contact pain clinic when ready to proceed with another injection.  States his feet/legs - give out.  Does limit his activity but is able to get around his house.  Discussed the need for colonoscopy.  He wants to hold on colonoscopy at this time.  Discussed labs.  Discussed starting crestor.  He is agreeable.  Handling stress.    ROS: See pertinent positives and negatives per HPI.  Past Medical History:  Diagnosis Date  . Acute postoperative pain 03/21/2018  . Anemia   . Arthritis   . Barrett esophagus   . Cancer ([place])    prostate,skin  . Chicken pox   . Diverticulitis   . Dysrhythmia   . GERD (gastroesophageal reflux  disease)   . Hyperlipidemia   . Hypertension   . Hypothyroidism   . Melanoma ([place])    Malignant resection  . Sleep apnea   . Ulcer     Past Surgical History:  Procedure Laterality Date  . CARDIAC CATHETERIZATION    . Cataract Surgery Right 02/13/14  . COLON SURGERY  2006-2008-2011   polyps removed  . COLONOSCOPY W/ POLYPECTOMY    . COLONOSCOPY WITH PROPOFOL N/A 02/15/2018   Procedure: COLONOSCOPY WITH PROPOFOL;  Surgeon: Manya Silvas, MD;  Location: [facility] ENDOSCOPY;  Service: Endoscopy;  Laterality: N/A;  . cystocopy  2003  . ESOPHAGOGASTRODUODENOSCOPY (EGD) WITH PROPOFOL N/A 08/04/2016   Procedure: ESOPHAGOGASTRODUODENOSCOPY (EGD) WITH PROPOFOL;  Surgeon: Manya Silvas, MD;  Location: [facility] ENDOSCOPY;  Service: Endoscopy;  Laterality: N/A;  . HEMORRHOID SURGERY    . PROSTATE SURGERY    . sleep study      Family History  Problem Relation Age of Onset  . Liver disease Mother   . Heart disease Father   . Kidney disease Father     SOCIAL HX: reviewed.    Current Outpatient Medications:  .  acetaminophen (TYLENOL) 500 MG tablet, Take 500 mg by mouth every 6 (six) hours as needed., Disp: , Rfl:  .  amLODipine (NORVASC) 5 MG tablet, TAKE 1 TABLET BY MOUTH DAILY, Disp: 30 tablet, Rfl: 5 .  aspirin 81 MG tablet, Take 81 mg by mouth daily., Disp: , Rfl:  .  Calcium Carb-Cholecalciferol (CALCIUM PLUS D3 ABSORBABLE)  [id number] MG-UNIT CAPS, Take 1 capsule by mouth daily with breakfast., Disp: 90 capsule, Rfl: 3 .  fluticasone (FLONASE) 50 MCG/ACT nasal spray, TWO PUFFS IN EACH NOSTRIL ONCE A DAY, Disp: 16 g, Rfl: 4 .  hydrochlorothiazide (HYDRODIURIL) 25 MG tablet, TAKE 1 TABLET BY MOUTH DAILY, Disp: 30 tablet, Rfl: 5 .  hyoscyamine (LEVSIN, ANASPAZ) 0.125 MG tablet, Take 0.125 mg by mouth as needed., Disp: , Rfl:  .  irbesartan (AVAPRO) 300 MG tablet, TAKE 1 TABLET BY MOUTH DAILY, Disp: 30 tablet, Rfl: 5 .  L-Lysine 500 MG TABS, Take by mouth daily., Disp: , Rfl:  .   levothyroxine (SYNTHROID) 100 MCG tablet, TAKE 1 TABLET BY MOUTH DAILY WITH BREAKFAST, Disp: 30 tablet, Rfl: 3 .  omeprazole (PRILOSEC) 20 MG capsule, TAKE ONE CAPSULE BY MOUTH TWICE A DAY, Disp: 60 capsule, Rfl: 3 .  traMADol (ULTRAM) 50 MG tablet, Take 1 tablet (50 mg total) by mouth every 8 (eight) hours as needed for severe pain., Disp: 90 tablet, Rfl: 5 .  rosuvastatin (CRESTOR) 5 MG tablet, Take 1 tablet (5 mg total) by mouth daily., Disp: 30 tablet, Rfl: 2 .  valACYclovir (VALTREX) 1000 MG tablet, TAKE 1 TABLET BY MOUTH DAILY, Disp: 30 tablet, Rfl: 3  EXAM:  VITALS per patient if applicable:  [id number]  GENERAL: alert.  Sounds to be in no acute distress.  Answering questions appropriately.    PSYCH/NEURO: pleasant and cooperative, no obvious depression or anxiety, speech and thought processing grossly intact  ASSESSMENT AND PLAN:  Discussed the following assessment and plan:  Barrett's esophagus Has been followed by GI.  Upper symptoms controlled on bid omeprazole.  Wants to hold on f/u with GI.   History of colonic polyps Colonoscopy 01/2018 - multiple polyps (tubular adenomas) - 22 removed.  Discussed the need for f/u with GI.  He wants to hold on f/u.  Will notify me when agreeable.   History of prostate cancer S/p prostatectomy.  08/03/18 - .01 .    Hypercholesterolemia Discussed cholesterol labs.  Low cholesterol diet and exercise.  Discussed calculated cholesterol risk.  Discussed starting crestor.  He is agreeable to start a few days per week.  Follow lipid panel and liver function tests.    Hypertension Blood pressure doing ok.  Follow pressures.  Follow metabolic panel.    Hypothyroid On thyroid replacement.  Follow tsh.   Malignant melanoma ([place]) Followed by dermatology.   Osteoarthritis of lumbar spine Followed by pain clinic.  Had injection in 06/2019.  Helped initially.  Will notify pain clinic when desires f/u injection.    Orders Placed This Encounter    Procedures  . Hepatic function panel    Standing Status:   Future    Standing Expiration Date:   09/16/2020  . Basic metabolic panel    Standing Status:   Future    Standing Expiration Date:   09/16/2020    Meds ordered this encounter  Medications  . rosuvastatin (CRESTOR) 5 MG tablet    Sig: Take 1 tablet (5 mg total) by mouth daily.    Dispense:  30 tablet    Refill:  2    Pt wants added to bubble packs. Thank you.     I discussed the assessment and treatment plan with the patient. The patient was provided an opportunity to ask questions and all were answered. The patient agreed with the plan and demonstrated an understanding of the instructions.   The patient was advised to call back  or seek an in-person evaluation if the symptoms worsen or if the condition fails to improve as anticipated.  I provided 21 minutes of non-face-to-face time during this encounter.   Einar Pheasant, MD

## 2019-09-23 ENCOUNTER — Encounter: Payer: Self-pay | Admitting: Internal Medicine

## 2019-09-23 NOTE — Assessment & Plan Note (Signed)
Discussed cholesterol labs.  Low cholesterol diet and exercise.  Discussed calculated cholesterol risk.  Discussed starting crestor.  He is agreeable to start a few days per week.  Follow lipid panel and liver function tests.

## 2019-09-23 NOTE — Assessment & Plan Note (Signed)
Colonoscopy 01/2018 - multiple polyps (tubular adenomas) - 22 removed.  Discussed the need for f/u with GI.  He wants to hold on f/u.  Will notify me when agreeable.

## 2019-09-23 NOTE — Assessment & Plan Note (Signed)
Blood pressure doing ok.  Follow pressures.  Follow metabolic panel.

## 2019-09-23 NOTE — Assessment & Plan Note (Signed)
Followed by pain clinic.  Had injection in 06/2019.  Helped initially.  Will notify pain clinic when desires f/u injection.

## 2019-09-23 NOTE — Assessment & Plan Note (Signed)
On thyroid replacement.  Follow tsh.  

## 2019-09-23 NOTE — Assessment & Plan Note (Signed)
S/p prostatectomy.  08/03/18 - .01 .

## 2019-09-23 NOTE — Assessment & Plan Note (Signed)
Followed by dermatology

## 2019-09-23 NOTE — Assessment & Plan Note (Signed)
Has been followed by GI.  Upper symptoms controlled on bid omeprazole.  Wants to hold on f/u with GI.

## 2019-09-28 ENCOUNTER — Other Ambulatory Visit: Payer: Self-pay | Admitting: Internal Medicine

## 2019-10-29 ENCOUNTER — Other Ambulatory Visit (INDEPENDENT_AMBULATORY_CARE_PROVIDER_SITE_OTHER): Payer: Medicare Other

## 2019-10-29 ENCOUNTER — Other Ambulatory Visit: Payer: Self-pay

## 2019-10-29 DIAGNOSIS — I10 Essential (primary) hypertension: Secondary | ICD-10-CM

## 2019-10-29 DIAGNOSIS — E7800 Pure hypercholesterolemia, unspecified: Secondary | ICD-10-CM | POA: Diagnosis not present

## 2019-10-29 LAB — BASIC METABOLIC PANEL
BUN: 19 mg/dL (ref 6–23)
CO2: 30 mEq/L (ref 19–32)
Calcium: 9.4 mg/dL (ref 8.4–10.5)
Chloride: 104 mEq/L (ref 96–112)
Creatinine, Ser: 1.29 mg/dL (ref 0.40–1.50)
GFR: 53.27 mL/min — ABNORMAL LOW (ref >60.00)
Glucose, Bld: 107 mg/dL — ABNORMAL HIGH (ref 70–99)
Potassium: 3.9 mEq/L (ref 3.5–5.1)
Sodium: 141 mEq/L (ref 135–145)

## 2019-10-29 LAB — HEPATIC FUNCTION PANEL
ALT: 23 U/L (ref 0–53)
AST: 26 U/L (ref 0–37)
Albumin: 4 g/dL (ref 3.5–5.2)
Alkaline Phosphatase: 57 U/L (ref 39–117)
Bilirubin, Direct: 0.1 mg/dL (ref 0.0–0.3)
Total Bilirubin: 0.6 mg/dL (ref 0.2–1.2)
Total Protein: 6.6 g/dL (ref 6.0–8.3)

## 2019-10-30 ENCOUNTER — Encounter: Payer: Self-pay | Admitting: Internal Medicine

## 2019-11-06 ENCOUNTER — Encounter: Payer: Self-pay | Admitting: Pain Medicine

## 2019-11-06 ENCOUNTER — Telehealth: Payer: Self-pay

## 2019-11-06 NOTE — Telephone Encounter (Signed)
Pt returned the call. Please give him a call.                                                  Thanks

## 2019-11-06 NOTE — Progress Notes (Signed)
Patient: Hector [name]  Service Category: E/M  Provider: Gaspar Cola, MD  DOB: [birth year]  DOS: 11/07/2019  Location: Office  MRN: [id number]  Setting: Ambulatory outpatient  Referring Provider: Einar Pheasant, MD  Type: Established Patient  Specialty: Interventional Pain Management  PCP: Einar Pheasant, MD  Location: Remote location  Delivery: TeleHealth     Virtual Encounter - Pain Management PROVIDER NOTE: Information contained herein reflects review and annotations entered in association with encounter. Interpretation of such information and data should be left to medically-trained personnel. Information provided to patient can be located elsewhere in the medical record under "Patient Instructions". Document created using STT-dictation technology, any transcriptional errors that may result from process are unintentional.    Contact & Pharmacy Preferred: [phone] Home: [phone] (home) Mobile: [phone] (mobile) E-mail: [email or web address]  [facility] [address], [place] [address] Phone: [phone] Fax: [phone]   Pre-screening  Hector [name] offered "in-person" vs "virtual" encounter. He indicated preferring virtual for this encounter.   Reason COVID-19*  Social distancing based on CDC and AMA recommendations.   I contacted Hector [name] on 11/07/2019 via telephone.      I clearly identified myself as Gaspar Cola, MD. I verified that I was speaking with the correct person using two identifiers (Name: Hector [name], and date of birth: 11/20/1934).  Consent I sought verbal advanced consent from Hector [name] for virtual visit interactions. I informed Hector [name] of possible security and privacy concerns, risks, and limitations associated with providing "not-in-person" medical evaluation and management services. I also informed Hector [name] of the availability of "in-person" appointments. Finally, I informed him  that there would be a charge for the virtual visit and that he could be  personally, fully or partially, financially responsible for it. Hector [name] expressed understanding and agreed to proceed.   Historic Elements   Hector [name] is a 85 y.o. year old, male patient evaluated today after his last contact with our practice on 07/04/2019. Hector [name]  has a past medical history of Acute postoperative pain (03/21/2018), Anemia, Arthritis, Barrett esophagus, Cancer ([place]), Chicken pox, Diverticulitis, Dysrhythmia, GERD (gastroesophageal reflux disease), Hyperlipidemia, Hypertension, Hypothyroidism, Melanoma ([place]), Sleep apnea, and Ulcer. He also  has a past surgical history that includes Prostate surgery; cystocopy (2003); Hemorrhoid surgery; Cardiac catheterization; sleep study; Colon surgery (2006-2008-2011); Cataract Surgery (Right, 02/13/14); Esophagogastroduodenoscopy (egd) with propofol (N/A, 08/04/2016); Colonoscopy w/ polypectomy; and Colonoscopy with propofol (N/A, 02/15/2018). Hector [name] has a current medication list which includes the following prescription(s): acetaminophen, amlodipine, aspirin, calcium plus d3 absorbable, fluticasone, hydrochlorothiazide, hyoscyamine, irbesartan, l-lysine, levothyroxine, omeprazole, rosuvastatin, [START ON 11/12/2019] tramadol, and valacyclovir. He  reports that he has never smoked. He has never used smokeless tobacco. He reports that he does not drink alcohol or use drugs. Hector [name] is allergic to tape.   HPI  Today, he is being contacted for medication management. The patient indicates doing well with the current medication regimen. No adverse reactions or side effects reported to the medications.   Pharmacotherapy Assessment  Analgesic: Tramadol 50 mg, 1 tab PO q 8 hrs (150 mg/day of tramadol) MME/day:52m/day.   Monitoring: [place] PMP: PDMP reviewed during this encounter.       Pharmacotherapy: No side-effects or adverse reactions reported. Compliance: No problems  identified. Effectiveness: Clinically acceptable. Plan: Refer to "POC".  UDS: No results found for: SUMMARY Laboratory Chemistry Profile   Renal Lab Results  Component Value Date  BUN 19 10/29/2019   CREATININE 1.29 10/29/2019   BCR 10 06/13/2017   GFR 53.27 (L) 10/29/2019   GFRAA >60 01/23/2018   GFRNONAA 56 (L) 01/23/2018     Hepatic Lab Results  Component Value Date   AST 26 10/29/2019   ALT 23 10/29/2019   ALBUMIN 4.0 10/29/2019   ALKPHOS 57 10/29/2019     Electrolytes Lab Results  Component Value Date   NA 141 10/29/2019   K 3.9 10/29/2019   CL 104 10/29/2019   CALCIUM 9.4 10/29/2019   MG 2.0 06/13/2017     Bone Lab Results  Component Value Date   VD25OH 21.46 (L) 03/28/2018   25OHVITD1 20 (L) 06/13/2017   25OHVITD2 <1.0 06/13/2017   25OHVITD3 20 06/13/2017     Inflammation (CRP: Acute Phase) (ESR: Chronic Phase) Lab Results  Component Value Date   CRP 2.8 06/13/2017   ESRSEDRATE 13 06/13/2017       Note: Above Lab results reviewed.  Imaging  Fluoro (C-Arm) (<60 min) (No Report) Fluoro was used, but no Radiologist interpretation will be provided.  Please refer to "NOTES" tab for provider progress note.  Assessment  The primary encounter diagnosis was Chronic low back pain (Primary Area of Pain) (Bilateral) (L>R). Diagnoses of Chronic pain syndrome, Chronic lower extremity pain (Secondary Area of Pain) (Left), L5-S1 bilateral pars defect with spondylolisthesis, Lumbar foraminal stenosis (L5-S1) (Bilateral) (L>R), and Lumbosacral Grade 1 Anterolisthesis of L5 over S1 were also pertinent to this visit.  Plan of Care  Problem-specific:  No problem-specific Assessment & Plan notes found for this encounter.  Hector [name] has a current medication list which includes the following long-term medication(s): amlodipine, calcium plus d3 absorbable, fluticasone, hydrochlorothiazide, hyoscyamine, irbesartan, levothyroxine, omeprazole, rosuvastatin, and  [START ON 11/12/2019] tramadol.  Pharmacotherapy (Medications Ordered): Meds ordered this encounter  Medications  . traMADol (ULTRAM) 50 MG tablet    Sig: Take 1 tablet (50 mg total) by mouth every 8 (eight) hours as needed for severe pain.    Dispense:  90 tablet    Refill:  5    Chronic Pain: STOP Act (Not applicable) Fill 1 day early if closed on refill date. Do not fill until: 11/12/2019. To last until: 05/10/2020. Avoid benzodiazepines within 8 hours of opioids   Orders:  No orders of the defined types were placed in this encounter.  Follow-up plan:   Return in about 26 weeks (around 05/07/2020) for (F2F), (MM).      Interventional management options: Planned, scheduled, and/or pending:      Considering:   Diagnostic left L5 TFESI  Possible left L5 nerve root ganglion RFA Diagnosticleft L4-5 LESI   Palliative PRN treatment(s):   Diagnostic midline caudal ESI #3  Palliativebilateral lumbar facet block #3 Palliative right-sided lumbar facet RFA #2 (last done 04/18/2018)  Palliative left-sided lumbar facet RFA #2 (last done 03/21/2018)      Recent Visits No visits were found meeting these conditions.  Showing recent visits within past 90 days and meeting all other requirements   Today's Visits Date Type Provider Dept  11/07/19 Telemedicine Milinda Pointer, MD [facility]  Showing today's visits and meeting all other requirements   Future Appointments No visits were found meeting these conditions.  Showing future appointments within next 90 days and meeting all other requirements   I discussed the assessment and treatment plan with the patient. The patient was provided an opportunity to ask questions and all were answered. The patient agreed with  the plan and demonstrated an understanding of the instructions.  Patient advised to call back or seek an in-person evaluation if the symptoms or condition worsens.  Duration of encounter: 12 minutes.  Note by:  Gaspar Cola, MD Date: 11/07/2019; Time: 1:11 PM

## 2019-11-06 NOTE — Telephone Encounter (Signed)
LM  To call office for pre virtual appointment questions

## 2019-11-07 ENCOUNTER — Ambulatory Visit: Payer: Medicare Other | Attending: Pain Medicine | Admitting: Pain Medicine

## 2019-11-07 ENCOUNTER — Telehealth: Payer: Self-pay | Admitting: *Deleted

## 2019-11-07 ENCOUNTER — Other Ambulatory Visit: Payer: Self-pay

## 2019-11-07 DIAGNOSIS — M5442 Lumbago with sciatica, left side: Secondary | ICD-10-CM | POA: Diagnosis not present

## 2019-11-07 DIAGNOSIS — M4300 Spondylolysis, site unspecified: Secondary | ICD-10-CM | POA: Diagnosis not present

## 2019-11-07 DIAGNOSIS — G894 Chronic pain syndrome: Secondary | ICD-10-CM | POA: Diagnosis not present

## 2019-11-07 DIAGNOSIS — M48061 Spinal stenosis, lumbar region without neurogenic claudication: Secondary | ICD-10-CM

## 2019-11-07 DIAGNOSIS — M79605 Pain in left leg: Secondary | ICD-10-CM

## 2019-11-07 DIAGNOSIS — M4310 Spondylolisthesis, site unspecified: Secondary | ICD-10-CM

## 2019-11-07 DIAGNOSIS — G8929 Other chronic pain: Secondary | ICD-10-CM

## 2019-11-07 MED ORDER — TRAMADOL HCL 50 MG PO TABS: 50.0000 mg | ORAL_TABLET | Freq: Three times a day (TID) | ORAL | 5 refills | Status: DC | PRN

## 2019-11-07 NOTE — Telephone Encounter (Signed)
Attempted to call patient for pre visit info. No answer. LVM.

## 2019-11-12 ENCOUNTER — Encounter: Payer: Medicare Other | Admitting: Pain Medicine

## 2019-11-23 ENCOUNTER — Other Ambulatory Visit: Payer: Self-pay | Admitting: Internal Medicine

## 2019-12-19 ENCOUNTER — Telehealth (INDEPENDENT_AMBULATORY_CARE_PROVIDER_SITE_OTHER): Payer: Medicare Other | Admitting: Nurse Practitioner

## 2019-12-19 ENCOUNTER — Encounter: Payer: Self-pay | Admitting: Nurse Practitioner

## 2019-12-19 VITALS — BP 144/62 | Temp 96.8°F | Ht 68.0 in | Wt 251.0 lb

## 2019-12-19 DIAGNOSIS — M109 Gout, unspecified: Secondary | ICD-10-CM | POA: Diagnosis not present

## 2019-12-19 MED ORDER — PREDNISONE 10 MG PO TABS: ORAL_TABLET | ORAL | 0 refills | Status: DC

## 2019-12-19 NOTE — Assessment & Plan Note (Signed)
Prednisone taper, low purine diet, gout info sheet. See Dr. Nicki Reaper in 1-2 weeks for change  HCTZ and consider allopurinol, uric acid test.

## 2019-12-19 NOTE — Progress Notes (Signed)
Virtual Visit via Video Note  I connected with@ on 12/19/19 at 11:00 AM EDT by a video enabled telemedicine application and verified that I am speaking with the correct person using two identifiers.   Location patient: home  Location provider: work  Persons participating in the virtual visit: Patient His daughter: Diane Therapist, occupational   I discussed the limitations, risks, security and privacy concerns of performing an evaluation and management service by telephone and the availability of in person appointments. I also discussed with the patient that there may be a patient responsible charge related to this service. The patient expressed understanding and agreed to proceed.   History of Present Illness:  This 85 yo male with history of HTN, thyroid disease, HLD, GERD, gastric ulcer and avoids NSAIDs,  back pain, sleep apnea, presents today for a gout flare. He was seen in the ED in 2019 for a similar flare. Since then, he has smaller, more transient flares.  This is the second gout flare this year.  He noticed symptoms started fairly quickly around May 7 at his toes, and his bottom of the left foot and great toe with  redness, swelling, pain around toes and bottom of left foot when he  tried to walk. He had not walked much for a few days as it was too painful to weight bear. It is slightly better to the point that he holds onto the counter and uses a walker and a cane. He stopped eating seafood, takes tart cherry extract and Tylenol.  He also has a slight flair of gout at right great toe and foot. He typically ignores minor flares, and was ignoring this one as well, but he could not weight bear and his daughter wanted him to be seen and talk about  Allopurinol.  He had no injury or trauma. No hx of stress fractures, RA, cellulitis. No fevers or chills. No chest pain, SOB, leg swelling. No recent surgery, or any illness. He is not diabetic. He had a Uric acid level 7.5 . He has HTN and is taking  25 mg HCTZ. This gout flare is identical to the one he experienced in 2019. In 2019, he did well with a Toradol IM injection and  prednisone taper and was Sx free in about 4 days.  History provided by patient and his DTR.  Past Medical History:  Diagnosis Date  . Acute postoperative pain 03/21/2018  . Anemia   . Arthritis   . Barrett esophagus   . Cancer ([place])    prostate,skin  . Chicken pox   . Diverticulitis   . Dysrhythmia   . GERD (gastroesophageal reflux disease)   . Hyperlipidemia   . Hypertension   . Hypothyroidism   . Melanoma ([place])    Malignant resection  . Sleep apnea   . Ulcer      Social History   Socioeconomic History  . Marital status: Married    Spouse name: Not on file  . Number of children: Not on file  . Years of education: Not on file  . Highest education level: Not on file  Occupational History  . Not on file  Tobacco Use  . Smoking status: Never Smoker  . Smokeless tobacco: Never Used  Substance and Sexual Activity  . Alcohol use: No    Alcohol/week: 0.0 standard drinks  . Drug use: No  . Sexual activity: Never  Other Topics Concern  . Not on file  Social History Narrative  . Not on file  Social Determinants of Health   Financial Resource Strain:   . Difficulty of Paying Living Expenses:   Food Insecurity:   . Worried About Charity fundraiser in the Last Year:   . Arboriculturist in the Last Year:   Transportation Needs:   . Film/video editor (Medical):   Marland Kitchen Lack of Transportation (Non-Medical):   Physical Activity:   . Days of Exercise per Week:   . Minutes of Exercise per Session:   Stress:   . Feeling of Stress :   Social Connections:   . Frequency of Communication with Friends and Family:   . Frequency of Social Gatherings with Friends and Family:   . Attends Religious Services:   . Active Member of Clubs or Organizations:   . Attends Archivist Meetings:   Marland Kitchen Marital Status:   Intimate Partner Violence:   .  Fear of Current or Ex-Partner:   . Emotionally Abused:   Marland Kitchen Physically Abused:   . Sexually Abused:     Past Surgical History:  Procedure Laterality Date  . CARDIAC CATHETERIZATION    . Cataract Surgery Right 02/13/14  . COLON SURGERY  2006-2008-2011   polyps removed  . COLONOSCOPY W/ POLYPECTOMY    . COLONOSCOPY WITH PROPOFOL N/A 02/15/2018   Procedure: COLONOSCOPY WITH PROPOFOL;  Surgeon: Manya Silvas, MD;  Location: [facility] ENDOSCOPY;  Service: Endoscopy;  Laterality: N/A;  . cystocopy  2003  . ESOPHAGOGASTRODUODENOSCOPY (EGD) WITH PROPOFOL N/A 08/04/2016   Procedure: ESOPHAGOGASTRODUODENOSCOPY (EGD) WITH PROPOFOL;  Surgeon: Manya Silvas, MD;  Location: [facility] ENDOSCOPY;  Service: Endoscopy;  Laterality: N/A;  . HEMORRHOID SURGERY    . PROSTATE SURGERY    . sleep study      Family History  Problem Relation Age of Onset  . Liver disease Mother   . Heart disease Father   . Kidney disease Father     Allergies  Allergen Reactions  . Tape Other (See Comments)    Surgical    Current Outpatient Medications on File Prior to Visit  Medication Sig Dispense Refill  . acetaminophen (TYLENOL) 500 MG tablet Take 500 mg by mouth every 6 (six) hours as needed.    Marland Kitchen amLODipine (NORVASC) 5 MG tablet TAKE 1 TABLET BY MOUTH DAILY 30 tablet 5  . aspirin 81 MG tablet Take 81 mg by mouth daily.    . Calcium Carb-Cholecalciferol (CALCIUM PLUS D3 ABSORBABLE) [phone] MG-UNIT CAPS Take 1 capsule by mouth daily with breakfast. 90 capsule 3  . fluticasone (FLONASE) 50 MCG/ACT nasal spray TWO PUFFS IN EACH NOSTRIL ONCE A DAY 16 g 4  . hydrochlorothiazide (HYDRODIURIL) 25 MG tablet TAKE 1 TABLET BY MOUTH DAILY 30 tablet 5  . hyoscyamine (LEVSIN, ANASPAZ) 0.125 MG tablet Take 0.125 mg by mouth as needed.    . irbesartan (AVAPRO) 300 MG tablet TAKE 1 TABLET BY MOUTH DAILY 30 tablet 5  . L-Lysine 500 MG TABS Take by mouth daily.    Marland Kitchen levothyroxine (SYNTHROID) 100 MCG tablet TAKE 1 TABLET BY MOUTH  DAILY WITH BREAKFAST 30 tablet 3  . omeprazole (PRILOSEC) 20 MG capsule TAKE ONE CAPSULE BY MOUTH TWICE A DAY 60 capsule 3  . rosuvastatin (CRESTOR) 5 MG tablet Take 1 tablet (5 mg total) by mouth daily. 30 tablet 2  . traMADol (ULTRAM) 50 MG tablet Take 1 tablet (50 mg total) by mouth every 8 (eight) hours as needed for severe pain. 90 tablet 5  . valACYclovir (VALTREX) 1000  MG tablet TAKE 1 TABLET BY MOUTH DAILY 30 tablet 3   No current facility-administered medications on file prior to visit.    BP (!) 144/62 (Patient Position: Sitting)   Temp (!) 96.8 F (36 C) (Oral)   Ht 5\' 8"  (1.727 m)   Wt 251 lb (113.9 kg)   BMI 38.16 kg/m   Observations/Objective: Gen: Awake, alert, no acute distress, fully dressed and sitting in chair. Talking and laughing.  Resp: Breathing is even and non-labored Psych: calm/pleasant demeanor Neuro: Alert and Oriented x 3, + facial symmetry, speech is clear. MSK: Poor video feed but his left foot looks to have redness and swelling at great toe joint and base of all toes on left foot .   Assessment and Plan: Acute gout flare atypical of his past events. He had quick onset of 1st metatarsal phalangeal joints involvement, swelling, involvement of other toe joints as well, pain and decreased range of motion in the foot.  He reports mild changes in the right foot,.  He has hypertension on HCTZ.  Uric acid last check was 7.5 01/23/2018.     PLAN: Begin prednisone 40 mg daily 2 days, and then 3 tabs for 2 days, and then 2 tabs for 2 days and then 1 tablet for 2 days. Follow the low purine gout diet, continue to monitor symptoms, if pain or swelling does not improve in 2 days or gets worse go to the ER for in-person visit.   Follow up with Dr. Nicki Reaper next 1-2 weeks for discussion about preventative care-Allopurinol after this acute event is resolved and changing his diuretics as HCTZ may contribute to gout flares.    Follow Up Instructions:  I discussed the  assessment and treatment plan with the patient. The patient was provided an opportunity to ask questions and all were answered. The patient agreed with the plan and demonstrated an understanding of the instructions.   The patient was advised to call back or seek an in-person evaluation if the symptoms worsen or if the condition fails to improve as anticipated.    Denice Paradise, NP

## 2019-12-19 NOTE — Telephone Encounter (Signed)
Sent Roff instruction for gout follow-up.

## 2019-12-19 NOTE — Patient Instructions (Addendum)
Begin the prednisone taper. OK to take acetaminophen or Tylenol if need for pain.    Do NOT take Advil, Aleve, ibuprofen, Motrin, BC Powders, Goody powder.  Avoid alcohol, see list below for what to do in acute gout flare.   Follow the low purine gout diet, continue to monitor symptoms, if pain or swelling does not improve in 2 days or gets worse go to the ER for in-person visit.   Follow up with Dr. Nicki Reaper next week for discussion about preventative care-Allopurinol after this acute event is resolved. She will also talk to you about switching your HCTZ fluid ill to something else.   Gout  Gout is a condition that causes painful swelling of the joints. Gout is a type of inflammation of the joints (arthritis). This condition is caused by having too much uric acid in the body. Uric acid is a chemical that forms when the body breaks down substances called purines. Purines are important for building body proteins. When the body has too much uric acid, sharp crystals can form and build up inside the joints. This causes pain and swelling. Gout attacks can happen quickly and may be very painful (acute gout). Over time, the attacks can affect more joints and become more frequent (chronic gout). Gout can also cause uric acid to build up under the skin and inside the kidneys. What are the causes? This condition is caused by too much uric acid in your blood. This can happen because:  Your kidneys do not remove enough uric acid from your blood. This is the most common cause.  Your body makes too much uric acid. This can happen with some cancers and cancer treatments. It can also occur if your body is breaking down too many red blood cells (hemolytic anemia).  You eat too many foods that are high in purines. These foods include organ meats and some seafood. Alcohol, especially beer, is also high in purines. A gout attack may be triggered by trauma or stress. What increases the risk? You are more likely to  develop this condition if you:  Have a family history of gout.  Are male and middle-aged.  Are male and have gone through menopause.  Are obese.  Frequently drink alcohol, especially beer.  Are dehydrated.  Lose weight too quickly.  Have an organ transplant.  Have lead poisoning.  Take certain medicines, including aspirin, cyclosporine, diuretics, levodopa, and niacin.  Have kidney disease.  Have a skin condition called psoriasis. What are the signs or symptoms? An attack of acute gout happens quickly. It usually occurs in just one joint. The most common place is the big toe. Attacks often start at night. Other joints that may be affected include joints of the feet, ankle, knee, fingers, wrist, or elbow. Symptoms of this condition may include:  Severe pain.  Warmth.  Swelling.  Stiffness.  Tenderness. The affected joint may be very painful to touch.  Shiny, red, or purple skin.  Chills and fever. Chronic gout may cause symptoms more frequently. More joints may be involved. You may also have white or yellow lumps (tophi) on your hands or feet or in other areas near your joints. How is this diagnosed? This condition is diagnosed based on your symptoms, medical history, and physical exam. You may have tests, such as:  Blood tests to measure uric acid levels.  Removal of joint fluid with a thin needle (aspiration) to look for uric acid crystals.  X-rays to look for joint damage. How  is this treated? Treatment for this condition has two phases: treating an acute attack and preventing future attacks. Acute gout treatment may include medicines to reduce pain and swelling, including:  Steroids. These are strong anti-inflammatory medicines that can be taken by mouth (orally) or injected into a joint.  Colchicine. This medicine relieves pain and swelling when it is taken soon after an attack. It can be given by mouth or through an IV. Preventive treatment may  include:  use of a medicine that reduces uric acid levels in your blood.  Changes to your diet. You may need to see a dietitian about what to eat and drink to prevent gout. Follow these instructions at home: During a gout attack   If directed, put ice on the affected area: ? Put ice in a plastic bag. ? Place a towel between your skin and the bag. ? Leave the ice on for 20 minutes, 2-3 times a day.  Raise (elevate) the affected joint above the level of your heart as often as possible.  Rest the joint as much as possible. If the affected joint is in your leg, you may be given crutches to use.  Follow instructions from your health care provider about eating or drinking restrictions. Avoiding future gout attacks  Follow a low-purine diet as told by your dietitian or health care provider. Avoid foods and drinks that are high in purines, including liver, kidney, anchovies, asparagus, herring, mushrooms, mussels, and beer.  Maintain a healthy weight or lose weight if you are overweight. If you want to lose weight, talk with your health care provider. It is important that you do not lose weight too quickly.  Start or maintain an exercise program as told by your health care provider. Eating and drinking  Drink enough fluids to keep your urine pale yellow.  If you drink alcohol: ? Limit how much you use to:  0-1 drink a day for women.  0-2 drinks a day for men. ? Be aware of how much alcohol is in your drink. In the [place] one drink equals one 12 oz bottle of beer (355 mL) one 5 oz glass of wine (148 mL), or one 1 oz glass of hard liquor (44 mL). General instructions  Take over-the-counter and prescription medicines only as told by your health care provider.  Do not drive or use heavy machinery while taking prescription pain medicine.  Return to your normal activities as told by your health care provider. Ask your health care provider what activities are safe for you.  Keep all  follow-up visits as told by your health care provider. This is important. Contact a health care provider if you have:  Another gout attack.  Continuing symptoms of a gout attack after 10 days of treatment.  Side effects from your medicines.  Chills or a fever.  Burning pain when you urinate.  Pain in your lower back or belly. Get help right away if you:  Have severe or uncontrolled pain.  Cannot urinate. Summary  Gout is painful swelling of the joints caused by inflammation.  The most common site of pain is the big toe, but it can affect other joints in the body.  Medicines and dietary changes can help to prevent and treat gout attacks. This information is not intended to replace advice given to you by your health care provider. Make sure you discuss any questions you have with your health care provider. Document Revised: 02/08/2018 Document Reviewed: 02/08/2018 Elsevier Patient Education  2020  [employer].

## 2019-12-21 ENCOUNTER — Other Ambulatory Visit: Payer: Self-pay | Admitting: Internal Medicine

## 2020-01-07 ENCOUNTER — Telehealth (INDEPENDENT_AMBULATORY_CARE_PROVIDER_SITE_OTHER): Payer: Medicare Other | Admitting: Internal Medicine

## 2020-01-07 ENCOUNTER — Encounter: Payer: Self-pay | Admitting: Internal Medicine

## 2020-01-07 DIAGNOSIS — M109 Gout, unspecified: Secondary | ICD-10-CM

## 2020-01-07 DIAGNOSIS — K2270 Barrett's esophagus without dysplasia: Secondary | ICD-10-CM

## 2020-01-07 DIAGNOSIS — C439 Malignant melanoma of skin, unspecified: Secondary | ICD-10-CM

## 2020-01-07 DIAGNOSIS — E039 Hypothyroidism, unspecified: Secondary | ICD-10-CM

## 2020-01-07 DIAGNOSIS — I10 Essential (primary) hypertension: Secondary | ICD-10-CM | POA: Diagnosis not present

## 2020-01-07 DIAGNOSIS — E7800 Pure hypercholesterolemia, unspecified: Secondary | ICD-10-CM | POA: Diagnosis not present

## 2020-01-07 DIAGNOSIS — Z8546 Personal history of malignant neoplasm of prostate: Secondary | ICD-10-CM

## 2020-01-07 DIAGNOSIS — Z86010 Personal history of colonic polyps: Secondary | ICD-10-CM

## 2020-01-07 DIAGNOSIS — M47816 Spondylosis without myelopathy or radiculopathy, lumbar region: Secondary | ICD-10-CM

## 2020-01-07 MED ORDER — AMLODIPINE BESYLATE 10 MG PO TABS
10.0000 mg | ORAL_TABLET | Freq: Every day | ORAL | 3 refills | Status: DC
Start: 2020-01-07 — End: 2020-04-24

## 2020-01-07 NOTE — Progress Notes (Addendum)
[name], male   DOB: 1934-10-09, 85 y.o.   MRN: [id number]   Virtual Visit via video Note  This visit type was conducted due to national recommendations for restrictions regarding the COVID-19 pandemic (e.g. social distancing).  This format is felt to be most appropriate for this patient at this time.  All issues noted in this document were discussed and addressed.  No physical exam was performed (except for noted visual exam findings with Video Visits).   I connected with [name] by a video enabled telemedicine application and verified that I am speaking with the correct person using two identifiers. Location patient: home Location provider: work Persons participating in the virtual visit: patient, provider and pts daughter Shauna Hugh.   The limitations, risks, security and privacy concerns of performing an evaluation and management service by video and the availability of in person appointments have been discussed.   It has also been discussed with the patient that there may be a patient responsible charge related to this service. The patient has expressed understanding and has agreed to proceed.   Reason for visit: work in appt.      HPI: History obtained from both pt and daughter.  Was evaluated recently by Dawson Bills for gout flare.  Note reviewed. Was treated with prednisone.  Is better.  No pain now.  Has had previous gout flares.  Discussed with him today.  Discussed diet.  Discussed changing hctz.  He is in agreement.  Discussed treatment - including allopurinol.  Denies any chest pain or sob. Eating. No nausea or vomiting.  No increased abdominal pain reported.  No foot or ankle/lower leg swelling.  On amlodipine and avapro.  States blood pressures ok.     ROS: See pertinent positives and negatives per HPI.  Past Medical History:  Diagnosis Date  . Acute postoperative pain 03/21/2018  . Anemia   . Arthritis   . Barrett esophagus   . Cancer ([place])    prostate,skin  .  Chicken pox   . Diverticulitis   . Dysrhythmia   . GERD (gastroesophageal reflux disease)   . Hyperlipidemia   . Hypertension   . Hypothyroidism   . Melanoma ([place])    Malignant resection  . Sleep apnea   . Ulcer     Past Surgical History:  Procedure Laterality Date  . CARDIAC CATHETERIZATION    . Cataract Surgery Right 02/13/14  . COLON SURGERY  2006-2008-2011   polyps removed  . COLONOSCOPY W/ POLYPECTOMY    . COLONOSCOPY WITH PROPOFOL N/A 02/15/2018   Procedure: COLONOSCOPY WITH PROPOFOL;  Surgeon: Manya Silvas, MD;  Location: [facility] ENDOSCOPY;  Service: Endoscopy;  Laterality: N/A;  . cystocopy  2003  . ESOPHAGOGASTRODUODENOSCOPY (EGD) WITH PROPOFOL N/A 08/04/2016   Procedure: ESOPHAGOGASTRODUODENOSCOPY (EGD) WITH PROPOFOL;  Surgeon: Manya Silvas, MD;  Location: [facility] ENDOSCOPY;  Service: Endoscopy;  Laterality: N/A;  . HEMORRHOID SURGERY    . PROSTATE SURGERY    . sleep study      Family History  Problem Relation Age of Onset  . Liver disease Mother   . Heart disease Father   . Kidney disease Father     SOCIAL HX: reviewed.     Current Outpatient Medications:  .  acetaminophen (TYLENOL) 500 MG tablet, Take 500 mg by mouth every 6 (six) hours as needed., Disp: , Rfl:  .  aspirin 81 MG tablet, Take 81 mg by mouth daily., Disp: , Rfl:  .  Calcium Carb-Cholecalciferol (  CALCIUM PLUS D3 ABSORBABLE) [phone] MG-UNIT CAPS, Take 1 capsule by mouth daily with breakfast., Disp: 90 capsule, Rfl: 3 .  fluticasone (FLONASE) 50 MCG/ACT nasal spray, TWO PUFFS IN EACH NOSTRIL ONCE A DAY, Disp: 16 g, Rfl: 4 .  hyoscyamine (LEVSIN, ANASPAZ) 0.125 MG tablet, Take 0.125 mg by mouth as needed., Disp: , Rfl:  .  irbesartan (AVAPRO) 300 MG tablet, TAKE 1 TABLET BY MOUTH DAILY, Disp: 30 tablet, Rfl: 5 .  L-Lysine 500 MG TABS, Take by mouth daily., Disp: , Rfl:  .  levothyroxine (SYNTHROID) 100 MCG tablet, TAKE 1 TABLET BY MOUTH DAILY WITH BREAKFAST, Disp: 30 tablet, Rfl: 3 .  omeprazole  (PRILOSEC) 20 MG capsule, TAKE ONE CAPSULE BY MOUTH TWICE A DAY, Disp: 60 capsule, Rfl: 3 .  predniSONE (DELTASONE) 10 MG tablet, Take 4 tablets ( total 40 mg) by mouth for 2 days; take 3 tablets ( total 30 mg) by mouth for 2 days; take 2 tablets (total 20mg ) by mouth for 2 days; then take 1 tablet ( total 10mg ) by mouth for 2 days., Disp: 20 tablet, Rfl: 0 .  rosuvastatin (CRESTOR) 5 MG tablet, TAKE 1 TABLET BY MOUTH DAILY, Disp: 30 tablet, Rfl: 2 .  traMADol (ULTRAM) 50 MG tablet, Take 1 tablet (50 mg total) by mouth every 8 (eight) hours as needed for severe pain., Disp: 90 tablet, Rfl: 5 .  valACYclovir (VALTREX) 1000 MG tablet, TAKE 1 TABLET BY MOUTH DAILY, Disp: 30 tablet, Rfl: 3 .  amLODipine (NORVASC) 10 MG tablet, Take 1 tablet (10 mg total) by mouth daily., Disp: 30 tablet, Rfl: 3  EXAM:  GENERAL: alert, oriented, appears well and in no acute distress  HEENT: atraumatic, conjunttiva clear, no obvious abnormalities on inspection of external nose and ears  NECK: normal movements of the head and neck  LUNGS: on inspection no signs of respiratory distress, breathing rate appears normal, no obvious gross SOB, gasping or wheezing  CV: no obvious cyanosis  PSYCH/NEURO: pleasant and cooperative, no obvious depression or anxiety, speech and thought processing grossly intact  ASSESSMENT AND PLAN:  Discussed the following assessment and plan:  Osteoarthritis of lumbar spine Has been followed by pain clinic.    Malignant melanoma ([place]) Followed by dermatology.   Hypothyroid On thyroid replacement.  Follow tsh.    Hypertension Blood pressures have been under reasonable control.  Will stop hctz - given gout flares.  Increase amlodipine to 10mg  q day.  Continue avapro.  Follow pressures.  Follow metabolic panel.   Hypercholesterolemia On crestor.  Low cholesterol diet and exercise.  Follow  Lipid panel and liver function tests.    History of prostate cancer S/p prostatectomy.  PSA  08/03/18 - .01.  Recheck next labs.    Barrett's esophagus No upper symptoms reported.  On prilosec.    Gout No pain now. Treated with prednisone.  Discussed allopurinol.  Will start 100mg  q day.  Stop hctz.  Adjust blood pressure medication as outlined.  Follow.    History of colonic polyps Colonoscopy 01/2018 - multiple polyps (tubular adenomas) - 22 removed.  Have discussed the need for f/u.  Had had previously wanted to hold off. Discuss f/u.      Meds ordered this encounter  Medications  . amLODipine (NORVASC) 10 MG tablet    Sig: Take 1 tablet (10 mg total) by mouth daily.    Dispense:  30 tablet    Refill:  3    Stop the hctz.  I discussed the assessment and treatment plan with the patient. The patient was provided an opportunity to ask questions and all were answered. The patient agreed with the plan and demonstrated an understanding of the instructions.   The patient was advised to call back or seek an in-person evaluation if the symptoms worsen or if the condition fails to improve as anticipated.   Einar Pheasant, MD

## 2020-01-13 ENCOUNTER — Encounter: Payer: Self-pay | Admitting: Internal Medicine

## 2020-01-13 ENCOUNTER — Telehealth: Payer: Self-pay | Admitting: Internal Medicine

## 2020-01-13 DIAGNOSIS — K635 Polyp of colon: Secondary | ICD-10-CM

## 2020-01-13 NOTE — Assessment & Plan Note (Signed)
On crestor.  Low cholesterol diet and exercise.  Follow  Lipid panel and liver function tests.   

## 2020-01-13 NOTE — Assessment & Plan Note (Signed)
On thyroid replacement.  Follow tsh.  

## 2020-01-13 NOTE — Assessment & Plan Note (Signed)
No upper symptoms reported. On prilosec.  

## 2020-01-13 NOTE — Assessment & Plan Note (Signed)
Followed by dermatology

## 2020-01-13 NOTE — Telephone Encounter (Signed)
Pt had colonoscopy - last - 22 polyps removed.  I had discussed with him previously about a f/u colonoscopy.  Let me know if agreeable.  I will place order for referral.  Also, if no further pain from gout, I can start him on allopurinol 100mg  q day - to prevent further flares.  Just let me know.

## 2020-01-13 NOTE — Assessment & Plan Note (Signed)
Colonoscopy 01/2018 - multiple polyps (tubular adenomas) - 22 removed.  Have discussed the need for f/u.  Had had previously wanted to hold off. Discuss f/u.

## 2020-01-13 NOTE — Assessment & Plan Note (Signed)
Blood pressures have been under reasonable control.  Will stop hctz - given gout flares.  Increase amlodipine to 10mg  q day.  Continue avapro.  Follow pressures.  Follow metabolic panel.

## 2020-01-13 NOTE — Assessment & Plan Note (Signed)
Has been followed by pain clinic.

## 2020-01-13 NOTE — Assessment & Plan Note (Signed)
S/p prostatectomy.  PSA 08/03/18 - .01.  Recheck next labs.

## 2020-01-13 NOTE — Assessment & Plan Note (Signed)
No pain now. Treated with prednisone.  Discussed allopurinol.  Will start 100mg  q day.  Stop hctz.  Adjust blood pressure medication as outlined.  Follow.

## 2020-01-14 ENCOUNTER — Other Ambulatory Visit: Payer: Self-pay

## 2020-01-14 MED ORDER — ALLOPURINOL 100 MG PO TABS
100.0000 mg | ORAL_TABLET | Freq: Every day | ORAL | 6 refills | Status: DC
Start: 2020-01-14 — End: 2020-08-04

## 2020-01-14 NOTE — Telephone Encounter (Signed)
Spoke with patient. He would like to call back and let us know about colonoscopy. Confirmed doing ok from last gout flare up. [facility] in [facility].

## 2020-01-14 NOTE — Telephone Encounter (Signed)
LMTCB

## 2020-01-14 NOTE — Telephone Encounter (Signed)
Patient wanted Larena Glassman to know  It is ok  to go ahead and schedule colonoscopy.

## 2020-01-14 NOTE — Telephone Encounter (Signed)
Patient agreeable to have colonoscopy done.

## 2020-01-14 NOTE — Telephone Encounter (Signed)
Referral placed.

## 2020-01-14 NOTE — Addendum Note (Signed)
Addended by: Caryl Bis, Nishita Isaacks G on: 01/14/2020 10:59 AM   Modules accepted: Orders

## 2020-01-18 ENCOUNTER — Other Ambulatory Visit: Payer: Self-pay | Admitting: Internal Medicine

## 2020-01-22 ENCOUNTER — Other Ambulatory Visit: Payer: Self-pay

## 2020-01-22 ENCOUNTER — Encounter: Payer: Self-pay | Admitting: Internal Medicine

## 2020-01-22 ENCOUNTER — Ambulatory Visit (INDEPENDENT_AMBULATORY_CARE_PROVIDER_SITE_OTHER): Payer: Medicare Other | Admitting: Internal Medicine

## 2020-01-22 VITALS — BP 160/70 | HR 71 | Temp 97.1°F | Resp 16 | Ht 68.0 in | Wt 259.0 lb

## 2020-01-22 DIAGNOSIS — Z8546 Personal history of malignant neoplasm of prostate: Secondary | ICD-10-CM

## 2020-01-22 DIAGNOSIS — R739 Hyperglycemia, unspecified: Secondary | ICD-10-CM | POA: Diagnosis not present

## 2020-01-22 DIAGNOSIS — E7800 Pure hypercholesterolemia, unspecified: Secondary | ICD-10-CM | POA: Diagnosis not present

## 2020-01-22 DIAGNOSIS — R1031 Right lower quadrant pain: Secondary | ICD-10-CM

## 2020-01-22 DIAGNOSIS — E039 Hypothyroidism, unspecified: Secondary | ICD-10-CM

## 2020-01-22 DIAGNOSIS — K2270 Barrett's esophagus without dysplasia: Secondary | ICD-10-CM

## 2020-01-22 DIAGNOSIS — I10 Essential (primary) hypertension: Secondary | ICD-10-CM | POA: Diagnosis not present

## 2020-01-22 MED ORDER — HYDRALAZINE HCL 10 MG PO TABS
10.0000 mg | ORAL_TABLET | Freq: Three times a day (TID) | ORAL | 2 refills | Status: DC
Start: 2020-01-22 — End: 2020-02-29

## 2020-01-22 NOTE — Progress Notes (Signed)
[name], male   DOB: 15-Oct-1934, 85 y.o.   MRN: [id number]   Subjective:    [name], male    DOB: [birth year], 85 y.o.   MRN: [id number]  HPI This visit occurred during the SARS-CoV-2 public health emergency.  Safety protocols were in place, including screening questions prior to the visit, additional usage of staff PPE, and extensive cleaning of exam room while observing appropriate contact time as indicated for disinfecting solutions.  Patient here for a scheduled follow up.  He reports he is doing relatively well.  Here to follow up on his blood pressure.  Still elevated.  hctz recently stopped secondary to gout.  Amlodipine was increased to 10mg .  No chest pain.  Breathing stable.  Bowels moving.  Some intermittent RLQ pain.  No urine change.  Increased stress with wife's medical issues.  Has good support from his family.  Overall he feels he is handling things relatively well.     Past Medical History:  Diagnosis Date  . Acute postoperative pain 03/21/2018  . Anemia   . Arthritis   . Barrett esophagus   . Cancer ([place])    prostate,skin  . Chicken pox   . Diverticulitis   . Dysrhythmia   . GERD (gastroesophageal reflux disease)   . Hyperlipidemia   . Hypertension   . Hypothyroidism   . Melanoma ([place])    Malignant resection  . Sleep apnea   . Ulcer    Past Surgical History:  Procedure Laterality Date  . CARDIAC CATHETERIZATION    . Cataract Surgery Right 02/13/14  . COLON SURGERY  2006-2008-2011   polyps removed  . COLONOSCOPY W/ POLYPECTOMY    . COLONOSCOPY WITH PROPOFOL N/A 02/15/2018   Procedure: COLONOSCOPY WITH PROPOFOL;  Surgeon: Manya Silvas, MD;  Location: [facility] ENDOSCOPY;  Service: Endoscopy;  Laterality: N/A;  . cystocopy  2003  . ESOPHAGOGASTRODUODENOSCOPY (EGD) WITH PROPOFOL N/A 08/04/2016   Procedure: ESOPHAGOGASTRODUODENOSCOPY (EGD) WITH PROPOFOL;  Surgeon: Manya Silvas, MD;  Location: [facility] ENDOSCOPY;  Service: Endoscopy;   Laterality: N/A;  . HEMORRHOID SURGERY    . PROSTATE SURGERY    . sleep study     Family History  Problem Relation Age of Onset  . Liver disease Mother   . Heart disease Father   . Kidney disease Father    Social History   Socioeconomic History  . Marital status: Married    Spouse name: Not on file  . Number of children: Not on file  . Years of education: Not on file  . Highest education level: Not on file  Occupational History  . Not on file  Tobacco Use  . Smoking status: Never Smoker  . Smokeless tobacco: Never Used  Vaping Use  . Vaping Use: Never used  Substance and Sexual Activity  . Alcohol use: No    Alcohol/week: 0.0 standard drinks  . Drug use: No  . Sexual activity: Never  Other Topics Concern  . Not on file  Social History Narrative  . Not on file   Social Determinants of Health   Financial Resource Strain:   . Difficulty of Paying Living Expenses:   Food Insecurity:   . Worried About Charity fundraiser in the Last Year:   . Arboriculturist in the Last Year:   Transportation Needs:   . Film/video editor (Medical):   Marland Kitchen Lack of Transportation (Non-Medical):   Physical Activity:   . Days  of Exercise per Week:   . Minutes of Exercise per Session:   Stress:   . Feeling of Stress :   Social Connections:   . Frequency of Communication with Friends and Family:   . Frequency of Social Gatherings with Friends and Family:   . Attends Religious Services:   . Active Member of Clubs or Organizations:   . Attends Archivist Meetings:   Marland Kitchen Marital Status:     Outpatient Encounter Medications as of 01/22/2020  Medication Sig  . acetaminophen (TYLENOL) 500 MG tablet Take 500 mg by mouth every 6 (six) hours as needed.  Marland Kitchen allopurinol (ZYLOPRIM) 100 MG tablet Take 1 tablet (100 mg total) by mouth daily.  Marland Kitchen amLODipine (NORVASC) 10 MG tablet Take 1 tablet (10 mg total) by mouth daily.  Marland Kitchen aspirin 81 MG tablet Take 81 mg by mouth daily.  . Calcium  Carb-Cholecalciferol (CALCIUM PLUS D3 ABSORBABLE) [phone] MG-UNIT CAPS Take 1 capsule by mouth daily with breakfast.  . fluticasone (FLONASE) 50 MCG/ACT nasal spray TWO PUFFS IN EACH NOSTRIL ONCE A DAY  . hydrALAZINE (APRESOLINE) 10 MG tablet Take 1 tablet (10 mg total) by mouth 3 (three) times daily.  . hyoscyamine (LEVSIN, ANASPAZ) 0.125 MG tablet Take 0.125 mg by mouth as needed.  . irbesartan (AVAPRO) 300 MG tablet TAKE 1 TABLET BY MOUTH DAILY  . L-Lysine 500 MG TABS Take by mouth daily.  Marland Kitchen levothyroxine (SYNTHROID) 100 MCG tablet TAKE 1 TABLET BY MOUTH DAILY WITH BREAKFAST  . omeprazole (PRILOSEC) 20 MG capsule TAKE ONE CAPSULE BY MOUTH TWICE A DAY  . traMADol (ULTRAM) 50 MG tablet Take 1 tablet (50 mg total) by mouth every 8 (eight) hours as needed for severe pain.  . valACYclovir (VALTREX) 1000 MG tablet TAKE 1 TABLET BY MOUTH DAILY  . [DISCONTINUED] predniSONE (DELTASONE) 10 MG tablet Take 4 tablets ( total 40 mg) by mouth for 2 days; take 3 tablets ( total 30 mg) by mouth for 2 days; take 2 tablets (total 20mg ) by mouth for 2 days; then take 1 tablet ( total 10mg ) by mouth for 2 days.  . [DISCONTINUED] rosuvastatin (CRESTOR) 5 MG tablet TAKE 1 TABLET BY MOUTH DAILY   No facility-administered encounter medications on file as of 01/22/2020.   Review of Systems  Constitutional: Negative for appetite change and unexpected weight change.  HENT: Negative for congestion and sinus pressure.   Respiratory: Negative for cough and chest tightness.        Breathing stable.   Cardiovascular: Negative for chest pain and palpitations.  Gastrointestinal: Negative for diarrhea, nausea and vomiting.       Some RLQ pain as outlined.    Genitourinary: Negative for difficulty urinating and dysuria.  Musculoskeletal: Negative for joint swelling and myalgias.  Skin: Negative for color change and rash.  Neurological: Negative for dizziness, light-headedness and headaches.  Psychiatric/Behavioral:  Negative for agitation and dysphoric mood.       Objective:    Physical Exam Vitals reviewed.  Constitutional:      General: He is not in acute distress.    Appearance: Normal appearance. He is well-developed.  HENT:     Head: Normocephalic and atraumatic.     Right Ear: External ear normal.     Left Ear: External ear normal.  Eyes:     General:        Right eye: No discharge.        Left eye: No discharge.     Conjunctiva/sclera:  Conjunctivae normal.  Cardiovascular:     Rate and Rhythm: Normal rate and regular rhythm.  Pulmonary:     Effort: Pulmonary effort is normal. No respiratory distress.     Breath sounds: Normal breath sounds.  Abdominal:     General: Bowel sounds are normal.     Palpations: Abdomen is soft.     Comments: No significant tenderness to palpation over abdomen, but did have some minimal tenderness right lower abdomen/mid abdomen.    Musculoskeletal:        General: No swelling or tenderness.     Cervical back: Neck supple. No tenderness.  Lymphadenopathy:     Cervical: No cervical adenopathy.  Skin:    Findings: No erythema or rash.  Neurological:     Mental Status: He is alert.  Psychiatric:        Mood and Affect: Mood normal.        Behavior: Behavior normal.     BP (!) 160/70   Pulse 71   Temp (!) 97.1 F (36.2 C)   Resp 16   Ht 5\' 8"  (1.727 m)   Wt 259 lb (117.5 kg)   SpO2 97%   BMI 39.38 kg/m  Wt Readings from Last 3 Encounters:  01/22/20 259 lb (117.5 kg)  01/07/20 251 lb (113.9 kg)  12/19/19 251 lb (113.9 kg)     Lab Results  Component Value Date   WBC 8.5 09/14/2019   HGB 14.5 09/14/2019   HCT 42.7 09/14/2019   PLT 234.0 09/14/2019   GLUCOSE 107 (H) 10/29/2019   CHOL 197 09/14/2019   TRIG 185.0 (H) 09/14/2019   HDL 58.30 09/14/2019   LDLDIRECT 101.0 08/03/2018   LDLCALC 101 (H) 09/14/2019   ALT 23 10/29/2019   AST 26 10/29/2019   NA 141 10/29/2019   K 3.9 10/29/2019   CL 104 10/29/2019   CREATININE 1.29  10/29/2019   BUN 19 10/29/2019   CO2 30 10/29/2019   TSH 3.89 09/14/2019   PSA 0.01 (L) 08/03/2018   HGBA1C 5.8 09/14/2019    Fluoro (C-Arm) (<60 min) (No Report)  Result Date: 07/03/2019 Fluoro was used, but no Radiologist interpretation will be provided. Please refer to "NOTES" tab for provider progress note.      Assessment & Plan:   Problem List Items Addressed This Visit    Barrett's esophagus    No upper symptoms reported.  On prilosec.        History of prostate cancer    S/p prostatectomy.  Follow psa.        Hypercholesterolemia    Was on crestor.  Stopped due to muscle aches.  Will remain off for now.  Hopefully trial of another statin in future.  Follow lipid panel.        Relevant Medications   hydrALAZINE (APRESOLINE) 10 MG tablet   Other Relevant Orders   Hepatic function panel   Lipid panel   Hypertension    Blood pressure elevated.  Off hctz as outlined.  Amlodipine increased to 10mg .  Continues on avapro.  Add hydralazine 10mg  tid.  Follow.        Relevant Medications   hydrALAZINE (APRESOLINE) 10 MG tablet   Other Relevant Orders   Basic metabolic panel   Hypothyroid    On thyroid replacement.  Follow tsh.        Right lower quadrant pain    Desires no further w/up at this time.  Follow.  Other Visit Diagnoses    Hyperglycemia    -  Primary   Relevant Orders   Hemoglobin A1c       Einar Pheasant, MD

## 2020-01-27 ENCOUNTER — Encounter: Payer: Self-pay | Admitting: Internal Medicine

## 2020-01-27 DIAGNOSIS — R1031 Right lower quadrant pain: Secondary | ICD-10-CM | POA: Insufficient documentation

## 2020-01-27 NOTE — Assessment & Plan Note (Signed)
Blood pressure elevated.  Off hctz as outlined.  Amlodipine increased to 10mg .  Continues on avapro.  Add hydralazine 10mg  tid.  Follow.

## 2020-01-27 NOTE — Assessment & Plan Note (Signed)
S/p prostatectomy.  Follow psa.  

## 2020-01-27 NOTE — Assessment & Plan Note (Signed)
On thyroid replacement.  Follow tsh.  

## 2020-01-27 NOTE — Assessment & Plan Note (Signed)
Was on crestor.  Stopped due to muscle aches.  Will remain off for now.  Hopefully trial of another statin in future.  Follow lipid panel.

## 2020-01-27 NOTE — Assessment & Plan Note (Signed)
No upper symptoms reported. On prilosec.  

## 2020-01-27 NOTE — Assessment & Plan Note (Signed)
Desires no further w/up at this time.  Follow.

## 2020-02-27 ENCOUNTER — Other Ambulatory Visit (INDEPENDENT_AMBULATORY_CARE_PROVIDER_SITE_OTHER): Payer: Medicare Other

## 2020-02-27 ENCOUNTER — Other Ambulatory Visit: Payer: Self-pay

## 2020-02-27 DIAGNOSIS — E7800 Pure hypercholesterolemia, unspecified: Secondary | ICD-10-CM

## 2020-02-27 DIAGNOSIS — I10 Essential (primary) hypertension: Secondary | ICD-10-CM

## 2020-02-27 DIAGNOSIS — R739 Hyperglycemia, unspecified: Secondary | ICD-10-CM

## 2020-02-27 LAB — HEPATIC FUNCTION PANEL
ALT: 28 U/L (ref 0–53)
AST: 33 U/L (ref 0–37)
Albumin: 3.9 g/dL (ref 3.5–5.2)
Alkaline Phosphatase: 52 U/L (ref 39–117)
Bilirubin, Direct: 0.1 mg/dL (ref 0.0–0.3)
Total Bilirubin: 0.6 mg/dL (ref 0.2–1.2)
Total Protein: 6.9 g/dL (ref 6.0–8.3)

## 2020-02-27 LAB — LIPID PANEL
Cholesterol: 151 mg/dL (ref 0–200)
HDL: 48.2 mg/dL (ref >39.00)
LDL Cholesterol: 67 mg/dL (ref 0–99)
NonHDL: 103.29
Total CHOL/HDL Ratio: 3
Triglycerides: 182 mg/dL — ABNORMAL HIGH (ref 0.0–149.0)
VLDL: 36.4 mg/dL (ref 0.0–40.0)

## 2020-02-27 LAB — BASIC METABOLIC PANEL
BUN: 11 mg/dL (ref 6–23)
CO2: 26 mEq/L (ref 19–32)
Calcium: 9.4 mg/dL (ref 8.4–10.5)
Chloride: 106 mEq/L (ref 96–112)
Creatinine, Ser: 1.18 mg/dL (ref 0.40–1.50)
GFR: 58.99 mL/min — ABNORMAL LOW (ref >60.00)
Glucose, Bld: 102 mg/dL — ABNORMAL HIGH (ref 70–99)
Potassium: 3.5 mEq/L (ref 3.5–5.1)
Sodium: 140 mEq/L (ref 135–145)

## 2020-02-27 LAB — HEMOGLOBIN A1C: Hgb A1c MFr Bld: 5.7 % (ref 4.6–6.5)

## 2020-02-29 ENCOUNTER — Ambulatory Visit (INDEPENDENT_AMBULATORY_CARE_PROVIDER_SITE_OTHER): Payer: Medicare Other

## 2020-02-29 ENCOUNTER — Ambulatory Visit (INDEPENDENT_AMBULATORY_CARE_PROVIDER_SITE_OTHER): Payer: Medicare Other | Admitting: Internal Medicine

## 2020-02-29 ENCOUNTER — Other Ambulatory Visit: Payer: Self-pay

## 2020-02-29 ENCOUNTER — Encounter: Payer: Self-pay | Admitting: Internal Medicine

## 2020-02-29 DIAGNOSIS — I493 Ventricular premature depolarization: Secondary | ICD-10-CM

## 2020-02-29 DIAGNOSIS — E7800 Pure hypercholesterolemia, unspecified: Secondary | ICD-10-CM | POA: Diagnosis not present

## 2020-02-29 DIAGNOSIS — I499 Cardiac arrhythmia, unspecified: Secondary | ICD-10-CM

## 2020-02-29 DIAGNOSIS — M25562 Pain in left knee: Secondary | ICD-10-CM

## 2020-02-29 DIAGNOSIS — M25561 Pain in right knee: Secondary | ICD-10-CM | POA: Diagnosis not present

## 2020-02-29 DIAGNOSIS — M25462 Effusion, left knee: Secondary | ICD-10-CM | POA: Diagnosis not present

## 2020-02-29 DIAGNOSIS — I10 Essential (primary) hypertension: Secondary | ICD-10-CM | POA: Diagnosis not present

## 2020-02-29 DIAGNOSIS — M1711 Unilateral primary osteoarthritis, right knee: Secondary | ICD-10-CM | POA: Diagnosis not present

## 2020-02-29 DIAGNOSIS — E039 Hypothyroidism, unspecified: Secondary | ICD-10-CM | POA: Diagnosis not present

## 2020-02-29 DIAGNOSIS — M25569 Pain in unspecified knee: Secondary | ICD-10-CM | POA: Insufficient documentation

## 2020-02-29 DIAGNOSIS — M1712 Unilateral primary osteoarthritis, left knee: Secondary | ICD-10-CM | POA: Diagnosis not present

## 2020-02-29 DIAGNOSIS — Z8546 Personal history of malignant neoplasm of prostate: Secondary | ICD-10-CM

## 2020-02-29 DIAGNOSIS — M109 Gout, unspecified: Secondary | ICD-10-CM

## 2020-02-29 DIAGNOSIS — K2270 Barrett's esophagus without dysplasia: Secondary | ICD-10-CM

## 2020-02-29 DIAGNOSIS — R600 Localized edema: Secondary | ICD-10-CM | POA: Diagnosis not present

## 2020-02-29 MED ORDER — HYDRALAZINE HCL 25 MG PO TABS: 25.0000 mg | ORAL_TABLET | Freq: Three times a day (TID) | ORAL | 2 refills | Status: DC

## 2020-02-29 NOTE — Progress Notes (Signed)
[name], male   DOB: September 13, 1934, 85 y.o.   MRN: [id number]   Subjective:    [name], male    DOB: 04/18/1935, 85 y.o.   MRN: [id number]  HPI This visit occurred during the SARS-CoV-2 public health emergency.  Safety protocols were in place, including screening questions prior to the visit, additional usage of staff PPE, and extensive cleaning of exam room while observing appropriate contact time as indicated for disinfecting solutions.  Patient here for a scheduled follow up.  Here to f/u regarding his blood pressure.  Was started on hydralazine last visit.  hctz stopped secondary to gout.  Started on allopurinol.  No gout flares since last visit.  Blood pressures remaining elevated.  138-160s/60-70s.  No chest pain.  Breathing stable.  Denies any increased heart rate or palpitations.  No increased abdominal pain.  Bowels moving.  Increased knee pain - bilateral knee pain - left worse.  Stopped cholesterol medication due to intolerance.    Past Medical History:  Diagnosis Date  . Acute postoperative pain 03/21/2018  . Anemia   . Arthritis   . Barrett esophagus   . Cancer ([place])    prostate,skin  . Chicken pox   . Diverticulitis   . Dysrhythmia   . GERD (gastroesophageal reflux disease)   . Hyperlipidemia   . Hypertension   . Hypothyroidism   . Melanoma ([place])    Malignant resection  . Sleep apnea   . Ulcer    Past Surgical History:  Procedure Laterality Date  . CARDIAC CATHETERIZATION    . Cataract Surgery Right 02/13/14  . COLON SURGERY  2006-2008-2011   polyps removed  . COLONOSCOPY W/ POLYPECTOMY    . COLONOSCOPY WITH PROPOFOL N/A 02/15/2018   Procedure: COLONOSCOPY WITH PROPOFOL;  Surgeon: Manya Silvas, MD;  Location: [facility] ENDOSCOPY;  Service: Endoscopy;  Laterality: N/A;  . cystocopy  2003  . ESOPHAGOGASTRODUODENOSCOPY (EGD) WITH PROPOFOL N/A 08/04/2016   Procedure: ESOPHAGOGASTRODUODENOSCOPY (EGD) WITH PROPOFOL;  Surgeon: Manya Silvas,  MD;  Location: [facility] ENDOSCOPY;  Service: Endoscopy;  Laterality: N/A;  . HEMORRHOID SURGERY    . PROSTATE SURGERY    . sleep study     Family History  Problem Relation Age of Onset  . Liver disease Mother   . Heart disease Father   . Kidney disease Father    Social History   Socioeconomic History  . Marital status: Married    Spouse name: Not on file  . Number of children: Not on file  . Years of education: Not on file  . Highest education level: Not on file  Occupational History  . Not on file  Tobacco Use  . Smoking status: Never Smoker  . Smokeless tobacco: Never Used  Vaping Use  . Vaping Use: Never used  Substance and Sexual Activity  . Alcohol use: No    Alcohol/week: 0.0 standard drinks  . Drug use: No  . Sexual activity: Never  Other Topics Concern  . Not on file  Social History Narrative  . Not on file   Social Determinants of Health   Financial Resource Strain:   . Difficulty of Paying Living Expenses:   Food Insecurity:   . Worried About Charity fundraiser in the Last Year:   . Arboriculturist in the Last Year:   Transportation Needs:   . Film/video editor (Medical):   Marland Kitchen Lack of Transportation (Non-Medical):   Physical Activity:   .  Days of Exercise per Week:   . Minutes of Exercise per Session:   Stress:   . Feeling of Stress :   Social Connections:   . Frequency of Communication with Friends and Family:   . Frequency of Social Gatherings with Friends and Family:   . Attends Religious Services:   . Active Member of Clubs or Organizations:   . Attends Archivist Meetings:   Marland Kitchen Marital Status:     Outpatient Encounter Medications as of 02/29/2020  Medication Sig  . acetaminophen (TYLENOL) 500 MG tablet Take 500 mg by mouth every 6 (six) hours as needed.  Marland Kitchen allopurinol (ZYLOPRIM) 100 MG tablet Take 1 tablet (100 mg total) by mouth daily.  Marland Kitchen amLODipine (NORVASC) 10 MG tablet Take 1 tablet (10 mg total) by mouth daily.  Marland Kitchen aspirin  81 MG tablet Take 81 mg by mouth daily.  . Calcium Carb-Cholecalciferol (CALCIUM PLUS D3 ABSORBABLE) [phone] MG-UNIT CAPS Take 1 capsule by mouth daily with breakfast.  . fluticasone (FLONASE) 50 MCG/ACT nasal spray TWO PUFFS IN EACH NOSTRIL ONCE A DAY  . hydrALAZINE (APRESOLINE) 25 MG tablet Take 1 tablet (25 mg total) by mouth 3 (three) times daily.  . hyoscyamine (LEVSIN, ANASPAZ) 0.125 MG tablet Take 0.125 mg by mouth as needed.  . irbesartan (AVAPRO) 300 MG tablet TAKE 1 TABLET BY MOUTH DAILY  . L-Lysine 500 MG TABS Take by mouth daily.  Marland Kitchen levothyroxine (SYNTHROID) 100 MCG tablet TAKE 1 TABLET BY MOUTH DAILY WITH BREAKFAST  . omeprazole (PRILOSEC) 20 MG capsule TAKE ONE CAPSULE BY MOUTH TWICE A DAY  . traMADol (ULTRAM) 50 MG tablet Take 1 tablet (50 mg total) by mouth every 8 (eight) hours as needed for severe pain.  . valACYclovir (VALTREX) 1000 MG tablet TAKE 1 TABLET BY MOUTH DAILY  . [DISCONTINUED] hydrALAZINE (APRESOLINE) 10 MG tablet Take 1 tablet (10 mg total) by mouth 3 (three) times daily.   No facility-administered encounter medications on file as of 02/29/2020.    Review of Systems  Constitutional: Negative for appetite change and unexpected weight change.  HENT: Negative for congestion and sinus pressure.   Respiratory: Negative for cough, chest tightness and shortness of breath.   Cardiovascular: Negative for chest pain and palpitations.       Some increased swelling (legs) - worse in evening.   Gastrointestinal: Negative for abdominal pain, diarrhea, nausea and vomiting.  Genitourinary: Negative for difficulty urinating and dysuria.  Musculoskeletal: Positive for back pain. Negative for joint swelling.       Knee pain as outlined.    Skin: Negative for color change and rash.  Neurological: Negative for dizziness, light-headedness and headaches.  Psychiatric/Behavioral: Negative for agitation and dysphoric mood.       Objective:    Physical Exam Vitals reviewed.    Constitutional:      General: He is not in acute distress.    Appearance: Normal appearance. He is well-developed.  HENT:     Head: Normocephalic and atraumatic.     Right Ear: External ear normal.     Left Ear: External ear normal.  Eyes:     General: No scleral icterus.       Right eye: No discharge.        Left eye: No discharge.     Conjunctiva/sclera: Conjunctivae normal.  Cardiovascular:     Rate and Rhythm: Normal rate and regular rhythm.  Pulmonary:     Effort: Pulmonary effort is normal. No respiratory distress.  Breath sounds: Normal breath sounds.  Abdominal:     General: Bowel sounds are normal.     Palpations: Abdomen is soft.     Tenderness: There is no abdominal tenderness.  Musculoskeletal:        General: No tenderness.     Cervical back: Neck supple. No tenderness.  Lymphadenopathy:     Cervical: No cervical adenopathy.  Skin:    Findings: No erythema or rash.  Neurological:     Mental Status: He is alert.  Psychiatric:        Mood and Affect: Mood normal.        Behavior: Behavior normal.     BP (!) 140/78   Pulse 81   Temp 97.7 F (36.5 C)   Resp 16   Ht 5\' 8"  (1.727 m)   Wt (!) 257 lb (116.6 kg)   SpO2 97%   BMI 39.08 kg/m  Wt Readings from Last 3 Encounters:  02/29/20 (!) 257 lb (116.6 kg)  01/22/20 259 lb (117.5 kg)  01/07/20 251 lb (113.9 kg)     Lab Results  Component Value Date   WBC 8.5 09/14/2019   HGB 14.5 09/14/2019   HCT 42.7 09/14/2019   PLT 234.0 09/14/2019   GLUCOSE 102 (H) 02/27/2020   CHOL 151 02/27/2020   TRIG 182.0 (H) 02/27/2020   HDL 48.20 02/27/2020   LDLDIRECT 101.0 08/03/2018   LDLCALC 67 02/27/2020   ALT 28 02/27/2020   AST 33 02/27/2020   NA 140 02/27/2020   K 3.5 02/27/2020   CL 106 02/27/2020   CREATININE 1.18 02/27/2020   BUN 11 02/27/2020   CO2 26 02/27/2020   TSH 3.89 09/14/2019   PSA 0.01 (L) 08/03/2018   HGBA1C 5.7 02/27/2020    Fluoro (C-Arm) (<60 min) (No Report)  Result Date:  07/03/2019 Fluoro was used, but no Radiologist interpretation will be provided. Please refer to "NOTES" tab for provider progress note.      Assessment & Plan:   Problem List Items Addressed This Visit    Asymptomatic PVCs    Noted to have irregular rhythm on exam.  EKG SR/SB with no acute ischemia changes.  Currently asymptomatic.  Follow.        Relevant Medications   hydrALAZINE (APRESOLINE) 25 MG tablet   Barrett's esophagus    No upper symptoms reported.  On prilosec.  Follow.       Gout    On allopurinol  No further gout flares.  Follow.        History of prostate cancer    S/p prostatectomy.  Follow psa.        Hypercholesterolemia    Was on crestor.  Stopped due to intolerance.  States knees hurt.  Discussed knees still hurting - off cholesterol medication.  Unsure if related.  Wants to remain off cholesterol medication for now.  Follow.        Relevant Medications   hydrALAZINE (APRESOLINE) 25 MG tablet   Hypertension    Blood pressure remains elevated.  Off hctz.  Increased hydralazine to 25mg  tid.  Continue avapro and amlodipine.  Follow pressures.  Follow metabolic panel.        Relevant Medications   hydrALAZINE (APRESOLINE) 25 MG tablet   Hypothyroid    On thyroid replacement.  Follow tsh.       Irregular heartbeat   Relevant Orders   EKG 12-Lead   Knee pain    Bilateral knee pain - left > right.  Limiting walking.  Given persistent pain, check xray.  May need ortho referral.       Relevant Orders   DG Knee 1-2 Views Left (Completed)   DG Knee 1-2 Views Right (Completed)       Einar Pheasant, MD

## 2020-03-01 ENCOUNTER — Encounter: Payer: Self-pay | Admitting: Internal Medicine

## 2020-03-01 NOTE — Assessment & Plan Note (Signed)
Bilateral knee pain - left > right.  Limiting walking.  Given persistent pain, check xray.  May need ortho referral.

## 2020-03-01 NOTE — Assessment & Plan Note (Signed)
On allopurinol.  No further gout flares.  Follow.  

## 2020-03-01 NOTE — Assessment & Plan Note (Signed)
S/p prostatectomy.  Follow psa.  

## 2020-03-01 NOTE — Assessment & Plan Note (Signed)
On thyroid replacement.  Follow tsh.  

## 2020-03-01 NOTE — Assessment & Plan Note (Signed)
Blood pressure remains elevated.  Off hctz.  Increased hydralazine to 25mg  tid.  Continue avapro and amlodipine.  Follow pressures.  Follow metabolic panel.

## 2020-03-01 NOTE — Assessment & Plan Note (Signed)
No upper symptoms reported.  On prilosec.  Follow.

## 2020-03-01 NOTE — Assessment & Plan Note (Signed)
Noted to have irregular rhythm on exam.  EKG SR/SB with no acute ischemia changes.  Currently asymptomatic.  Follow.

## 2020-03-01 NOTE — Assessment & Plan Note (Signed)
Was on crestor.  Stopped due to intolerance.  States knees hurt.  Discussed knees still hurting - off cholesterol medication.  Unsure if related.  Wants to remain off cholesterol medication for now.  Follow.

## 2020-03-04 ENCOUNTER — Other Ambulatory Visit: Payer: Self-pay | Admitting: Internal Medicine

## 2020-03-04 DIAGNOSIS — M25561 Pain in right knee: Secondary | ICD-10-CM

## 2020-03-04 NOTE — Progress Notes (Signed)
Order placed ortho referral.

## 2020-03-10 DIAGNOSIS — K2270 Barrett's esophagus without dysplasia: Secondary | ICD-10-CM | POA: Diagnosis not present

## 2020-03-10 DIAGNOSIS — Z86010 Personal history of colonic polyps: Secondary | ICD-10-CM | POA: Diagnosis not present

## 2020-03-20 ENCOUNTER — Other Ambulatory Visit
Admission: RE | Admit: 2020-03-20 | Discharge: 2020-03-20 | Disposition: A | Discharge status: Home / Self Care | Payer: Medicare Other | Source: Ambulatory Visit | Attending: Gastroenterology | Admitting: Gastroenterology

## 2020-03-20 ENCOUNTER — Other Ambulatory Visit: Payer: Self-pay

## 2020-03-20 DIAGNOSIS — Z01812 Encounter for preprocedural laboratory examination: Secondary | ICD-10-CM | POA: Diagnosis not present

## 2020-03-20 DIAGNOSIS — Z20822 Contact with and (suspected) exposure to covid-19: Secondary | ICD-10-CM | POA: Insufficient documentation

## 2020-03-20 DIAGNOSIS — M7051 Other bursitis of knee, right knee: Secondary | ICD-10-CM | POA: Diagnosis not present

## 2020-03-20 DIAGNOSIS — M25561 Pain in right knee: Secondary | ICD-10-CM | POA: Diagnosis not present

## 2020-03-20 DIAGNOSIS — M170 Bilateral primary osteoarthritis of knee: Secondary | ICD-10-CM | POA: Diagnosis not present

## 2020-03-20 DIAGNOSIS — M25562 Pain in left knee: Secondary | ICD-10-CM | POA: Diagnosis not present

## 2020-03-20 DIAGNOSIS — M7061 Trochanteric bursitis, right hip: Secondary | ICD-10-CM | POA: Diagnosis not present

## 2020-03-21 LAB — SARS CORONAVIRUS 2 (TAT 6-24 HRS): SARS Coronavirus 2: NEGATIVE

## 2020-03-24 ENCOUNTER — Ambulatory Visit
Admission: RE | Admit: 2020-03-24 | Discharge: 2020-03-24 | Disposition: A | Discharge status: Home / Self Care | Payer: Medicare Other | Attending: Gastroenterology | Admitting: Gastroenterology

## 2020-03-24 ENCOUNTER — Ambulatory Visit: Payer: Medicare Other | Admitting: Anesthesiology

## 2020-03-24 ENCOUNTER — Encounter
Admission: RE | Disposition: A | Discharge status: Home / Self Care | Payer: Self-pay | Source: Home / Self Care | Attending: Gastroenterology

## 2020-03-24 ENCOUNTER — Other Ambulatory Visit: Payer: Self-pay

## 2020-03-24 DIAGNOSIS — K2270 Barrett's esophagus without dysplasia: Secondary | ICD-10-CM | POA: Insufficient documentation

## 2020-03-24 DIAGNOSIS — K2100 Gastro-esophageal reflux disease with esophagitis, without bleeding: Secondary | ICD-10-CM | POA: Diagnosis not present

## 2020-03-24 DIAGNOSIS — D120 Benign neoplasm of cecum: Secondary | ICD-10-CM | POA: Insufficient documentation

## 2020-03-24 DIAGNOSIS — M1990 Unspecified osteoarthritis, unspecified site: Secondary | ICD-10-CM | POA: Diagnosis not present

## 2020-03-24 DIAGNOSIS — E039 Hypothyroidism, unspecified: Secondary | ICD-10-CM | POA: Diagnosis not present

## 2020-03-24 DIAGNOSIS — D124 Benign neoplasm of descending colon: Secondary | ICD-10-CM | POA: Diagnosis not present

## 2020-03-24 DIAGNOSIS — Z7982 Long term (current) use of aspirin: Secondary | ICD-10-CM | POA: Insufficient documentation

## 2020-03-24 DIAGNOSIS — D123 Benign neoplasm of transverse colon: Secondary | ICD-10-CM | POA: Diagnosis not present

## 2020-03-24 DIAGNOSIS — Z79899 Other long term (current) drug therapy: Secondary | ICD-10-CM | POA: Insufficient documentation

## 2020-03-24 DIAGNOSIS — E785 Hyperlipidemia, unspecified: Secondary | ICD-10-CM | POA: Diagnosis not present

## 2020-03-24 DIAGNOSIS — I10 Essential (primary) hypertension: Secondary | ICD-10-CM | POA: Diagnosis not present

## 2020-03-24 DIAGNOSIS — K219 Gastro-esophageal reflux disease without esophagitis: Secondary | ICD-10-CM | POA: Diagnosis not present

## 2020-03-24 DIAGNOSIS — Z79890 Hormone replacement therapy (postmenopausal): Secondary | ICD-10-CM | POA: Diagnosis not present

## 2020-03-24 DIAGNOSIS — Z86010 Personal history of colonic polyps: Secondary | ICD-10-CM | POA: Diagnosis not present

## 2020-03-24 DIAGNOSIS — Z1211 Encounter for screening for malignant neoplasm of colon: Secondary | ICD-10-CM | POA: Diagnosis not present

## 2020-03-24 DIAGNOSIS — Z85820 Personal history of malignant melanoma of skin: Secondary | ICD-10-CM | POA: Insufficient documentation

## 2020-03-24 DIAGNOSIS — D122 Benign neoplasm of ascending colon: Secondary | ICD-10-CM | POA: Diagnosis not present

## 2020-03-24 DIAGNOSIS — G4730 Sleep apnea, unspecified: Secondary | ICD-10-CM | POA: Insufficient documentation

## 2020-03-24 DIAGNOSIS — K635 Polyp of colon: Secondary | ICD-10-CM | POA: Diagnosis not present

## 2020-03-24 HISTORY — PX: COLONOSCOPY WITH PROPOFOL: SHX5780

## 2020-03-24 HISTORY — PX: ESOPHAGOGASTRODUODENOSCOPY (EGD) WITH PROPOFOL: SHX5813

## 2020-03-24 SURGERY — COLONOSCOPY WITH PROPOFOL
Anesthesia: General

## 2020-03-24 MED ORDER — PROPOFOL 10 MG/ML IV BOLUS
INTRAVENOUS | Status: AC
  Filled 2020-03-24: qty 20

## 2020-03-24 MED ORDER — SODIUM CHLORIDE 0.9 % IV SOLN
INTRAVENOUS | Status: DC
  Administered 2020-03-24: 20 mL/h via INTRAVENOUS

## 2020-03-24 MED ORDER — PROPOFOL 10 MG/ML IV BOLUS
INTRAVENOUS | Status: DC | PRN
  Administered 2020-03-24: 50 mg via INTRAVENOUS

## 2020-03-24 MED ORDER — LIDOCAINE HCL (CARDIAC) PF 100 MG/5ML IV SOSY
PREFILLED_SYRINGE | INTRAVENOUS | Status: DC | PRN
  Administered 2020-03-24: 30 mg via INTRAVENOUS

## 2020-03-24 MED ORDER — LIDOCAINE HCL (PF) 2 % IJ SOLN
INTRAMUSCULAR | Status: AC
  Filled 2020-03-24: qty 5

## 2020-03-24 MED ORDER — GLYCOPYRROLATE 0.2 MG/ML IJ SOLN
INTRAMUSCULAR | Status: AC
  Filled 2020-03-24: qty 1

## 2020-03-24 MED ORDER — GLYCOPYRROLATE 0.2 MG/ML IJ SOLN
INTRAMUSCULAR | Status: DC | PRN
  Administered 2020-03-24: .1 mg via INTRAVENOUS

## 2020-03-24 MED ORDER — PROPOFOL 500 MG/50ML IV EMUL
INTRAVENOUS | Status: DC | PRN
  Administered 2020-03-24: 50 ug/kg/min via INTRAVENOUS

## 2020-03-24 MED ORDER — PROPOFOL 500 MG/50ML IV EMUL
INTRAVENOUS | Status: AC
  Filled 2020-03-24: qty 50

## 2020-03-24 NOTE — Interval H&P Note (Signed)
History and Physical Interval Note:  03/24/2020 10:38 AM  [name]  has presented today for surgery, with the diagnosis of PH POLYPS BARRETT'S.  The various methods of treatment have been discussed with the patient and family. After consideration of risks, benefits and other options for treatment, the patient has consented to  Procedure(s): COLONOSCOPY WITH PROPOFOL (N/A) ESOPHAGOGASTRODUODENOSCOPY (EGD) WITH PROPOFOL (N/A) as a surgical intervention.  The patient's history has been reviewed, patient examined, no change in status, stable for surgery.  I have reviewed the patient's chart and labs.  Questions were answered to the patient's satisfaction.     Lesly Rubenstein  Ok to proceed with EGD/Colonoscopy.

## 2020-03-24 NOTE — Op Note (Signed)
[facility] Gastroenterology Patient Name: Hector [name] Procedure Date: 03/24/2020 9:37 AM MRN: [id number] Account #: [id number] Date of Birth: 1934/09/14 Admit Type: Outpatient Age: 85 Room: [facility] ENDO ROOM 2 Gender: Male Note Status: Finalized Procedure:             Upper GI endoscopy Indications:           Barrett's esophagus Providers:             Andrey Farmer MD, MD Medicines:             Monitored Anesthesia Care Complications:         No immediate complications. Estimated blood loss:                         Minimal. Procedure:             Pre-Anesthesia Assessment:                        - Prior to the procedure, a History and Physical was                         performed, and patient medications and allergies were                         reviewed. The patient is competent. The risks and                         benefits of the procedure and the sedation options and                         risks were discussed with the patient. All questions                         were answered and informed consent was obtained.                         Patient identification and proposed procedure were                         verified by the physician, the nurse, the anesthetist                         and the technician in the endoscopy suite. Mental                         Status Examination: alert and oriented. Airway                         Examination: normal oropharyngeal airway and neck                         mobility. Respiratory Examination: clear to                         auscultation. CV Examination: normal. Prophylactic                         Antibiotics: The patient does not require prophylactic  antibiotics. Prior Anticoagulants: The patient has                         taken no previous anticoagulant or antiplatelet                         agents. ASA Grade Assessment: III - A patient with                         severe systemic  disease. After reviewing the risks and                         benefits, the patient was deemed in satisfactory                         condition to undergo the procedure. The anesthesia                         plan was to use monitored anesthesia care (MAC).                         Immediately prior to administration of medications,                         the patient was re-assessed for adequacy to receive                         sedatives. The heart rate, respiratory rate, oxygen                         saturations, blood pressure, adequacy of pulmonary                         ventilation, and response to care were monitored                         throughout the procedure. The physical status of the                         patient was re-assessed after the procedure.                        After obtaining informed consent, the endoscope was                         passed under direct vision. Throughout the procedure,                         the patient's blood pressure, pulse, and oxygen                         saturations were monitored continuously. The Endoscope                         was introduced through the mouth, and advanced to the                         second part of duodenum. The upper GI endoscopy was  accomplished without difficulty. The patient tolerated                         the procedure well. Findings:      The esophagus and gastroesophageal junction were examined with white       light and narrow band imaging (NBI). There were esophageal mucosal       changes secondary to established short-segment Barrett's disease,       classified as Barrett's stage C0-M2 per Prague criteria. These changes       involved the mucosa extending to the Z-line. Two tongues of       salmon-colored mucosa were present. The maximum longitudinal extent of       these esophageal mucosal changes was 2 cm in length. Mucosa was biopsied       with a cold forceps for  histology in 4 quadrants in the lower third of       the esophagus. One specimen bottle was sent to pathology. Estimated       blood loss was minimal.      The entire examined stomach was normal.      The examined duodenum was normal. Impression:            - Esophageal mucosal changes secondary to established                         short-segment Barrett's disease, classified as                         Barrett's stage C0-M2 per Prague criteria. Biopsied.                        - Normal stomach.                        - Normal examined duodenum. Recommendation:        - Discharge patient to home.                        - Resume previous diet.                        - Continue present medications.                        - Await pathology results.                        - Repeat upper endoscopy for surveillance based on                         pathology results.                        - Return to referring physician as previously                         scheduled. Procedure Code(s):     --- Professional ---                        [phone], Esophagogastroduodenoscopy, flexible,  transoral; with biopsy, single or multiple Diagnosis Code(s):     --- Professional ---                        K22.70, Barrett's esophagus without dysplasia CPT copyright 2019 American Medical Association. All rights reserved. The codes documented in this report are preliminary and upon coder review may  be revised to meet current compliance requirements. Andrey Farmer, MD Andrey Farmer MD, MD 03/24/2020 11:46:34 AM Number of Addenda: 0 Note Initiated On: 03/24/2020 9:37 AM Estimated Blood Loss:  Estimated blood loss was minimal.      [facility]

## 2020-03-24 NOTE — H&P (Signed)
Outpatient short stay form Pre-procedure 03/24/2020 10:36 AM Raylene Miyamoto MD, MPH  Primary Physician: Dr. Nicki Reaper  Reason for visit:  Surveillance of BE's and surveillance of colon  History of present illness:   85 y/o gentleman with history of short segment BE's without dysplasia here for surveillance and history of > 20 TA's here for colonoscopy. No family history of GI malignancies. He has never had genetic testing. History of prostate surgery.    Current Facility-Administered Medications:  .  0.9 %  sodium chloride infusion, , Intravenous, Continuous, Ytzel Gubler, Hilton Cork, MD, Last Rate: 20 mL/hr at 03/24/20 1024, 20 mL/hr at 03/24/20 1024  Medications Prior to Admission  Medication Sig Dispense Refill Last Dose  . acetaminophen (TYLENOL) 500 MG tablet Take 500 mg by mouth every 6 (six) hours as needed.   Past Week at Unknown time  . allopurinol (ZYLOPRIM) 100 MG tablet Take 1 tablet (100 mg total) by mouth daily. 30 tablet 6 Past Week at Unknown time  . amLODipine (NORVASC) 10 MG tablet Take 1 tablet (10 mg total) by mouth daily. 30 tablet 3 03/23/2020 at Unknown time  . aspirin 81 MG tablet Take 81 mg by mouth daily.   Past Week at Unknown time  . Calcium Carb-Cholecalciferol (CALCIUM PLUS D3 ABSORBABLE) [phone] MG-UNIT CAPS Take 1 capsule by mouth daily with breakfast. 90 capsule 3 Past Week at Unknown time  . fluticasone (FLONASE) 50 MCG/ACT nasal spray TWO PUFFS IN EACH NOSTRIL ONCE A DAY 16 g 4 Past Week at Unknown time  . hydrALAZINE (APRESOLINE) 25 MG tablet Take 1 tablet (25 mg total) by mouth 3 (three) times daily. 90 tablet 2 Past Week at Unknown time  . hyoscyamine (LEVSIN, ANASPAZ) 0.125 MG tablet Take 0.125 mg by mouth as needed.   Past Week at Unknown time  . irbesartan (AVAPRO) 300 MG tablet TAKE 1 TABLET BY MOUTH DAILY 30 tablet 5 03/24/2020 at 0800  . L-Lysine 500 MG TABS Take by mouth daily.   Past Week at Unknown time  . levothyroxine (SYNTHROID) 100 MCG tablet TAKE  1 TABLET BY MOUTH DAILY WITH BREAKFAST 30 tablet 3 Past Week at Unknown time  . omeprazole (PRILOSEC) 20 MG capsule TAKE ONE CAPSULE BY MOUTH TWICE A DAY 60 capsule 3 Past Week at Unknown time  . traMADol (ULTRAM) 50 MG tablet Take 1 tablet (50 mg total) by mouth every 8 (eight) hours as needed for severe pain. 90 tablet 5 Past Week at Unknown time  . valACYclovir (VALTREX) 1000 MG tablet TAKE 1 TABLET BY MOUTH DAILY 30 tablet 3 Past Week at Unknown time     Allergies  Allergen Reactions  . Tape Other (See Comments)    Surgical     Past Medical History:  Diagnosis Date  . Acute postoperative pain 03/21/2018  . Anemia   . Arthritis   . Barrett esophagus   . Cancer ([place])    prostate,skin  . Chicken pox   . Diverticulitis   . Dysrhythmia   . GERD (gastroesophageal reflux disease)   . Hyperlipidemia   . Hypertension   . Hypothyroidism   . Melanoma ([place])    Malignant resection  . Sleep apnea   . Ulcer     Review of systems:  Otherwise negative.    Physical Exam  Gen: Alert, oriented. Appears stated age.  HEENT: [place]/AT. PERRLA. Lungs: No respiratory distress Abd: soft, benign, no masses. BS+ Ext: No edema.    Planned procedures: Proceed with EGD/colonoscopy. The  patient understands the nature of the planned procedure, indications, risks, alternatives and potential complications including but not limited to bleeding, infection, perforation, damage to internal organs and possible oversedation/side effects from anesthesia. The patient agrees and gives consent to proceed.  Please refer to procedure notes for findings, recommendations and patient disposition/instructions.     Raylene Miyamoto MD, MPH Gastroenterology 03/24/2020  10:36 AM

## 2020-03-24 NOTE — Anesthesia Postprocedure Evaluation (Signed)
Anesthesia Post Note  Patient: Hector [name]  Procedure(s) Performed: COLONOSCOPY WITH PROPOFOL (N/A ) ESOPHAGOGASTRODUODENOSCOPY (EGD) WITH PROPOFOL (N/A )  Patient location during evaluation: PACU Anesthesia Type: General Level of consciousness: awake and alert Pain management: pain level controlled Vital Signs Assessment: post-procedure vital signs reviewed and stable Respiratory status: spontaneous breathing, nonlabored ventilation, respiratory function stable and patient connected to nasal cannula oxygen Cardiovascular status: blood pressure returned to baseline and stable Postop Assessment: no apparent nausea or vomiting Anesthetic complications: no   No complications documented.   Last Vitals:  Vitals:   03/24/20 1012 03/24/20 1150  BP: (!) 185/94 115/76  Pulse: 68 62  Resp: 20 (!) 22  Temp: 36.5 C 36.8 C  SpO2: 99%     Last Pain:  Vitals:   03/24/20 1150  TempSrc: Temporal  PainSc: [place] Henrine Hayter

## 2020-03-24 NOTE — Op Note (Signed)
[facility] Gastroenterology Patient Name: Hector [name] Procedure Date: 03/24/2020 9:36 AM MRN: [id number] Account #: [id number] Date of Birth: Apr 13, 1935 Admit Type: Outpatient Age: 85 Room: [facility] ENDO ROOM 2 Gender: Male Note Status: Finalized Procedure:             Colonoscopy Indications:           High risk colon cancer surveillance: Personal history                         of multiple (3 or more) adenomas Providers:             Andrey Farmer MD, MD Medicines:             Monitored Anesthesia Care Complications:         No immediate complications. Estimated blood loss:                         Minimal. Procedure:             Pre-Anesthesia Assessment:                        - Prior to the procedure, a History and Physical was                         performed, and patient medications and allergies were                         reviewed. The patient is competent. The risks and                         benefits of the procedure and the sedation options and                         risks were discussed with the patient. All questions                         were answered and informed consent was obtained.                         Patient identification and proposed procedure were                         verified by the physician, the nurse, the anesthetist                         and the technician in the endoscopy suite. Mental                         Status Examination: alert and oriented. Airway                         Examination: normal oropharyngeal airway and neck                         mobility. Respiratory Examination: clear to                         auscultation. CV Examination: normal. Prophylactic  Antibiotics: The patient does not require prophylactic                         antibiotics. Prior Anticoagulants: The patient has                         taken no previous anticoagulant or antiplatelet                         agents.  ASA Grade Assessment: III - A patient with                         severe systemic disease. After reviewing the risks and                         benefits, the patient was deemed in satisfactory                         condition to undergo the procedure. The anesthesia                         plan was to use monitored anesthesia care (MAC).                         Immediately prior to administration of medications,                         the patient was re-assessed for adequacy to receive                         sedatives. The heart rate, respiratory rate, oxygen                         saturations, blood pressure, adequacy of pulmonary                         ventilation, and response to care were monitored                         throughout the procedure. The physical status of the                         patient was re-assessed after the procedure.                        After obtaining informed consent, the colonoscope was                         passed under direct vision. Throughout the procedure,                         the patient's blood pressure, pulse, and oxygen                         saturations were monitored continuously. The                         Colonoscope was introduced through the anus and  advanced to the the cecum, identified by appendiceal                         orifice and ileocecal valve. The colonoscopy was                         performed without difficulty. The patient tolerated                         the procedure well. The quality of the bowel                         preparation was good. Findings:      The perianal and digital rectal examinations were normal.      Two sessile polyps were found in the cecum. The polyps were 2 to 3 mm in       size. These polyps were removed with a cold snare. Resection and       retrieval were complete. Estimated blood loss was minimal.      A 1 mm polyp was found in the cecum. The polyp was  sessile. The polyp       was removed with a jumbo cold forceps. Resection and retrieval were       complete. Estimated blood loss was minimal.      A 2 mm polyp was found in the ascending colon. The polyp was sessile.       The polyp was removed with a jumbo cold forceps. Resection and retrieval       were complete. Estimated blood loss was minimal.      A 4 mm polyp was found in the ascending colon. The polyp was sessile.       The polyp was removed with a cold snare. Resection and retrieval were       complete. Estimated blood loss was minimal.      Three sessile polyps were found in the transverse colon. The polyps were       2 to 4 mm in size. These polyps were removed with a cold snare.       Resection and retrieval were complete. Estimated blood loss was minimal.      Five sessile polyps were found in the descending colon. The polyps were       3 to 5 mm in size. These polyps were removed with a cold snare.       Resection and retrieval were complete. Estimated blood loss was minimal.      A 2 mm polyp was found in the descending colon. The polyp was sessile.       The polyp was removed with a jumbo cold forceps. Resection and retrieval       were complete. Estimated blood loss was minimal.      The exam was otherwise without abnormality on direct and retroflexion       views. Impression:            - Two 2 to 3 mm polyps in the cecum, removed with a                         cold snare. Resected and retrieved.                        - One  1 mm polyp in the cecum, removed with a jumbo                         cold forceps. Resected and retrieved.                        - One 2 mm polyp in the ascending colon, removed with                         a jumbo cold forceps. Resected and retrieved.                        - One 4 mm polyp in the ascending colon, removed with                         a cold snare. Resected and retrieved.                        - Three 2 to 4 mm polyps in the  transverse colon,                         removed with a cold snare. Resected and retrieved.                        - Five 3 to 5 mm polyps in the descending colon,                         removed with a cold snare. Resected and retrieved.                        - One 2 mm polyp in the descending colon, removed with                         a jumbo cold forceps. Resected and retrieved.                        - The examination was otherwise normal on direct and                         retroflexion views. Recommendation:        - Discharge patient to home.                        - Resume previous diet.                        - Continue present medications.                        - Await pathology results.                        - Repeat colonoscopy in 1 year for surveillance if                         benefits outweigh risks.                        -  Return to referring physician as previously                         scheduled.                        - Recommend discussion about referring for genetic                         testing. Procedure Code(s):     --- Professional ---                        [phone], Colonoscopy, flexible; with removal of                         tumor(s), polyp(s), or other lesion(s) by snare                         technique                        [id number], 24, Colonoscopy, flexible; with biopsy, single                         or multiple Diagnosis Code(s):     --- Professional ---                        K63.5, Polyp of colon                        Z86.010, Personal history of colonic polyps CPT copyright 2019 American Medical Association. All rights reserved. The codes documented in this report are preliminary and upon coder review may  be revised to meet current compliance requirements. Andrey Farmer, MD Andrey Farmer MD, MD 03/24/2020 11:54:47 AM Number of Addenda: 0 Note Initiated On: 03/24/2020 9:36 AM Scope Withdrawal Time: 0 hours 41 minutes 45 seconds   Total Procedure Duration: 0 hours 48 minutes 35 seconds  Estimated Blood Loss:  Estimated blood loss was minimal.      [facility]

## 2020-03-24 NOTE — Anesthesia Preprocedure Evaluation (Signed)
Anesthesia Evaluation  Patient identified by MRN, date of birth, ID band Patient awake    Reviewed: Allergy & Precautions, H&P , NPO status , Patient's Chart, lab work & pertinent test results, reviewed documented beta blocker date and time   Airway Mallampati: II   Neck ROM: full    Dental  (+) Poor Dentition   Pulmonary sleep apnea and Continuous Positive Airway Pressure Ventilation ,    Pulmonary exam normal        Cardiovascular Exercise Tolerance: Poor hypertension, On Medications Normal cardiovascular exam+ dysrhythmias  Rhythm:regular Rate:Normal     Neuro/Psych  Neuromuscular disease negative psych ROS   GI/Hepatic Neg liver ROS, GERD  Medicated,  Endo/Other  Hypothyroidism Morbid obesity  Renal/GU negative Renal ROS  negative genitourinary   Musculoskeletal   Abdominal   Peds  Hematology  (+) Blood dyscrasia, anemia ,   Anesthesia Other Findings Past Medical History: 03/21/2018: Acute postoperative pain No date: Anemia No date: Arthritis No date: Barrett esophagus No date: Cancer ([place])     Comment:  prostate,skin No date: Chicken pox No date: Diverticulitis No date: Dysrhythmia No date: GERD (gastroesophageal reflux disease) No date: Hyperlipidemia No date: Hypertension No date: Hypothyroidism No date: Melanoma ([place])     Comment:  Malignant resection No date: Sleep apnea No date: Ulcer Past Surgical History: No date: CARDIAC CATHETERIZATION 02/13/14: Cataract Surgery; Right 2006-2008-2011: COLON SURGERY     Comment:  polyps removed No date: COLONOSCOPY W/ POLYPECTOMY 02/15/2018: COLONOSCOPY WITH PROPOFOL; N/A     Comment:  Procedure: COLONOSCOPY WITH PROPOFOL;  Surgeon: Manya Silvas, MD;  Location: [facility] ENDOSCOPY;  Service:               Endoscopy;  Laterality: N/A; 2003: cystocopy 08/04/2016: ESOPHAGOGASTRODUODENOSCOPY (EGD) WITH PROPOFOL; N/A     Comment:  Procedure:  ESOPHAGOGASTRODUODENOSCOPY (EGD) WITH               PROPOFOL;  Surgeon: Manya Silvas, MD;  Location: [facility]              ENDOSCOPY;  Service: Endoscopy;  Laterality: N/A; No date: HEMORRHOID SURGERY No date: PROSTATE SURGERY No date: sleep study BMI    Body Mass Index: 38.01 kg/m     Reproductive/Obstetrics negative OB ROS                             Anesthesia Physical Anesthesia Plan  ASA: III  Anesthesia Plan: General   Post-op Pain Management:    Induction:   PONV Risk Score and Plan:   Airway Management Planned:   Additional Equipment:   Intra-op Plan:   Post-operative Plan:   Informed Consent: I have reviewed the patients History and Physical, chart, labs and discussed the procedure including the risks, benefits and alternatives for the proposed anesthesia with the patient or authorized representative who has indicated his/her understanding and acceptance.     Dental Advisory Given  Plan Discussed with: CRNA  Anesthesia Plan Comments:         Anesthesia Quick Evaluation

## 2020-03-24 NOTE — Transfer of Care (Signed)
Immediate Anesthesia Transfer of Care Note  Patient: Hector [name]  Procedure(s) Performed: COLONOSCOPY WITH PROPOFOL (N/A ) ESOPHAGOGASTRODUODENOSCOPY (EGD) WITH PROPOFOL (N/A )  Patient Location: PACU  Anesthesia Type:MAC  Level of Consciousness: sedated  Airway & Oxygen Therapy: Patient Spontanous Breathing  Post-op Assessment: Report given to RN and Post -op Vital signs reviewed and stable  Post vital signs: Reviewed and stable  Last Vitals:  Vitals Value Taken Time  BP 115/76 03/24/20 1152  Temp    Pulse 54 03/24/20 1153  Resp 15 03/24/20 1153  SpO2 96 % 03/24/20 1153  Vitals shown include unvalidated device data.  Last Pain:  Vitals:   03/24/20 1150  TempSrc: (P) Temporal  PainSc:          Complications: No complications documented.

## 2020-03-25 LAB — SURGICAL PATHOLOGY

## 2020-03-26 ENCOUNTER — Encounter: Payer: Self-pay | Admitting: Gastroenterology

## 2020-03-28 ENCOUNTER — Other Ambulatory Visit: Payer: Self-pay | Admitting: Internal Medicine

## 2020-04-14 ENCOUNTER — Ambulatory Visit (INDEPENDENT_AMBULATORY_CARE_PROVIDER_SITE_OTHER): Payer: Medicare Other | Admitting: Internal Medicine

## 2020-04-14 ENCOUNTER — Encounter: Payer: Self-pay | Admitting: Internal Medicine

## 2020-04-14 ENCOUNTER — Other Ambulatory Visit: Payer: Self-pay

## 2020-04-14 VITALS — BP 140/58 | HR 83 | Temp 98.3°F | Ht 67.99 in | Wt 248.0 lb

## 2020-04-14 DIAGNOSIS — E7800 Pure hypercholesterolemia, unspecified: Secondary | ICD-10-CM

## 2020-04-14 DIAGNOSIS — I10 Essential (primary) hypertension: Secondary | ICD-10-CM

## 2020-04-14 DIAGNOSIS — Z23 Encounter for immunization: Secondary | ICD-10-CM

## 2020-04-14 DIAGNOSIS — E039 Hypothyroidism, unspecified: Secondary | ICD-10-CM | POA: Diagnosis not present

## 2020-04-14 DIAGNOSIS — M109 Gout, unspecified: Secondary | ICD-10-CM | POA: Diagnosis not present

## 2020-04-14 DIAGNOSIS — R739 Hyperglycemia, unspecified: Secondary | ICD-10-CM

## 2020-04-14 DIAGNOSIS — M25561 Pain in right knee: Secondary | ICD-10-CM

## 2020-04-14 DIAGNOSIS — M25562 Pain in left knee: Secondary | ICD-10-CM

## 2020-04-14 NOTE — Progress Notes (Signed)
[name], male   DOB: 09-10-1934, 85 y.o.   MRN: [id number]   Subjective:    [name], male    DOB: [birth year], 85 y.o.   MRN: [id number]  HPI This visit occurred during the SARS-CoV-2 public health emergency.  Safety protocols were in place, including screening questions prior to the visit, additional usage of staff PPE, and extensive cleaning of exam room while observing appropriate contact time as indicated for disinfecting solutions.  Patient here for a scheduled follow up.  He reports he is doing relatively well.  Having problems with his knee.  Right knee better.  S/p left knee injections.  Left knee is worse.  No chest pain reported.  Breathing stable.  No acid reflux or abdominal pain reported.  Bowels moving.  Outside blood pressures reviewed - averaging 120-140s/60-70s.    Past Medical History:  Diagnosis Date  . Acute postoperative pain 03/21/2018  . Anemia   . Arthritis   . Barrett esophagus   . Cancer ([place])    prostate,skin  . Chicken pox   . Diverticulitis   . Dysrhythmia   . GERD (gastroesophageal reflux disease)   . Hyperlipidemia   . Hypertension   . Hypothyroidism   . Melanoma ([place])    Malignant resection  . Sleep apnea   . Ulcer    Past Surgical History:  Procedure Laterality Date  . CARDIAC CATHETERIZATION    . Cataract Surgery Right 02/13/14  . COLON SURGERY  2006-2008-2011   polyps removed  . COLONOSCOPY W/ POLYPECTOMY    . COLONOSCOPY WITH PROPOFOL N/A 02/15/2018   Procedure: COLONOSCOPY WITH PROPOFOL;  Surgeon: Manya Silvas, MD;  Location: [facility] ENDOSCOPY;  Service: Endoscopy;  Laterality: N/A;  . COLONOSCOPY WITH PROPOFOL N/A 03/24/2020   Procedure: COLONOSCOPY WITH PROPOFOL;  Surgeon: Lesly Rubenstein, MD;  Location: [facility] ENDOSCOPY;  Service: Endoscopy;  Laterality: N/A;  . cystocopy  2003  . ESOPHAGOGASTRODUODENOSCOPY (EGD) WITH PROPOFOL N/A 08/04/2016   Procedure: ESOPHAGOGASTRODUODENOSCOPY (EGD) WITH PROPOFOL;   Surgeon: Manya Silvas, MD;  [facility] ENDOSCOPY;  Service: Endoscopy;  Laterality: N/A;  . ESOPHAGOGASTRODUODENOSCOPY (EGD) WITH PROPOFOL N/A 03/24/2020   Procedure: ESOPHAGOGASTRODUODENOSCOPY (EGD) WITH PROPOFOL;  Surgeon: Lesly Rubenstein, MD;  Location: [facility] ENDOSCOPY;  Service: Endoscopy;  Laterality: N/A;  . HEMORRHOID SURGERY    . PROSTATE SURGERY    . sleep study     Family History  Problem Relation Age of Onset  . Liver disease Mother   . Heart disease Father   . Kidney disease Father    Social History   Socioeconomic History  . Marital status: Married    Spouse name: Not on file  . Number of children: Not on file  . Years of education: Not on file  . Highest education level: Not on file  Occupational History  . Not on file  Tobacco Use  . Smoking status: Never Smoker  . Smokeless tobacco: Never Used  Vaping Use  . Vaping Use: Never used  Substance and Sexual Activity  . Alcohol use: No    Alcohol/week: 0.0 standard drinks  . Drug use: No  . Sexual activity: Never  Other Topics Concern  . Not on file  Social History Narrative  . Not on file   Social Determinants of Health   Financial Resource Strain:   . Difficulty of Paying Living Expenses: Not on file  Food Insecurity:   . Worried About Charity fundraiser in  the Last Year: Not on file  . Ran Out of Food in the Last Year: Not on file  Transportation Needs:   . Lack of Transportation (Medical): Not on file  . Lack of Transportation (Non-Medical): Not on file  Physical Activity:   . Days of Exercise per Week: Not on file  . Minutes of Exercise per Session: Not on file  Stress:   . Feeling of Stress : Not on file  Social Connections:   . Frequency of Communication with Friends and Family: Not on file  . Frequency of Social Gatherings with Friends and Family: Not on file  . Attends Religious Services: Not on file  . Active Member of Clubs or Organizations: Not on file  . Attends Theatre manager Meetings: Not on file  . Marital Status: Not on file    Outpatient Encounter Medications as of 04/14/2020  Medication Sig  . acetaminophen (TYLENOL) 500 MG tablet Take 500 mg by mouth every 6 (six) hours as needed.  Marland Kitchen allopurinol (ZYLOPRIM) 100 MG tablet Take 1 tablet (100 mg total) by mouth daily.  Marland Kitchen amLODipine (NORVASC) 10 MG tablet Take 1 tablet (10 mg total) by mouth daily.  Marland Kitchen aspirin 81 MG tablet Take 81 mg by mouth daily.  . Calcium Carb-Cholecalciferol (CALCIUM PLUS D3 ABSORBABLE) [phone] MG-UNIT CAPS Take 1 capsule by mouth daily with breakfast.  . fluticasone (FLONASE) 50 MCG/ACT nasal spray TWO PUFFS IN EACH NOSTRIL ONCE A DAY  . hydrALAZINE (APRESOLINE) 25 MG tablet Take 1 tablet (25 mg total) by mouth 3 (three) times daily. (Patient taking differently: Take 25 mg by mouth 3 (three) times daily. )  . hyoscyamine (LEVSIN, ANASPAZ) 0.125 MG tablet Take 0.125 mg by mouth as needed.  . irbesartan (AVAPRO) 300 MG tablet TAKE 1 TABLET BY MOUTH DAILY  . L-Lysine 500 MG TABS Take by mouth daily.  Marland Kitchen levothyroxine (SYNTHROID) 100 MCG tablet TAKE 1 TABLET BY MOUTH DAILY WITH BREAKFAST  . omeprazole (PRILOSEC) 20 MG capsule TAKE ONE CAPSULE BY MOUTH TWICE A DAY  . rosuvastatin (CRESTOR) 5 MG tablet TAKE 1 TABLET BY MOUTH DAILY  . traMADol (ULTRAM) 50 MG tablet Take 1 tablet (50 mg total) by mouth every 8 (eight) hours as needed for severe pain.  . valACYclovir (VALTREX) 1000 MG tablet TAKE 1 TABLET BY MOUTH DAILY   No facility-administered encounter medications on file as of 04/14/2020.   Review of Systems  Constitutional: Negative for appetite change and unexpected weight change.  HENT: Negative for congestion and sinus pressure.   Respiratory: Negative for cough and chest tightness.        Breathing stable.    Cardiovascular: Negative for chest pain and palpitations.       No increased leg swelling.    Gastrointestinal: Negative for abdominal pain, diarrhea, nausea and  vomiting.  Genitourinary: Negative for difficulty urinating and dysuria.  Musculoskeletal: Negative for joint swelling and myalgias.  Skin: Negative for color change and rash.  Neurological: Negative for dizziness, light-headedness and headaches.  Psychiatric/Behavioral: Negative for agitation and dysphoric mood.       Objective:    Physical Exam Vitals reviewed.  Constitutional:      General: He is not in acute distress.    Appearance: Normal appearance. He is well-developed.  HENT:     Head: Normocephalic and atraumatic.     Right Ear: External ear normal.     Left Ear: External ear normal.  Eyes:     General: No  scleral icterus.       Right eye: No discharge.        Left eye: No discharge.     Conjunctiva/sclera: Conjunctivae normal.  Cardiovascular:     Rate and Rhythm: Normal rate and regular rhythm.  Pulmonary:     Effort: Pulmonary effort is normal. No respiratory distress.     Breath sounds: Normal breath sounds.  Abdominal:     General: Bowel sounds are normal.     Palpations: Abdomen is soft.     Tenderness: There is no abdominal tenderness.  Musculoskeletal:        General: No tenderness.     Cervical back: Neck supple. No tenderness.     Comments: No increased swelling.   Lymphadenopathy:     Cervical: No cervical adenopathy.  Skin:    Findings: No erythema or rash.  Neurological:     Mental Status: He is alert.  Psychiatric:        Mood and Affect: Mood normal.        Behavior: Behavior normal.     BP (!) 140/58 (BP Location: Left Arm, Patient Position: Sitting)   Pulse 83   Temp 98.3 F (36.8 C)   Ht 5' 7.99" (1.727 m)   Wt 248 lb (112.5 kg)   SpO2 97%   BMI 37.72 kg/m  Wt Readings from Last 3 Encounters:  04/14/20 248 lb (112.5 kg)  03/24/20 250 lb (113.4 kg)  02/29/20 (!) 257 lb (116.6 kg)     Lab Results  Component Value Date   WBC 8.5 09/14/2019   HGB 14.5 09/14/2019   HCT 42.7 09/14/2019   PLT 234.0 09/14/2019   GLUCOSE 102  (H) 02/27/2020   CHOL 151 02/27/2020   TRIG 182.0 (H) 02/27/2020   HDL 48.20 02/27/2020   LDLDIRECT 101.0 08/03/2018   LDLCALC 67 02/27/2020   ALT 28 02/27/2020   AST 33 02/27/2020   NA 140 02/27/2020   K 3.5 02/27/2020   CL 106 02/27/2020   CREATININE 1.18 02/27/2020   BUN 11 02/27/2020   CO2 26 02/27/2020   TSH 3.89 09/14/2019   PSA 0.01 (L) 08/03/2018   HGBA1C 5.7 02/27/2020       Assessment & Plan:   Problem List Items Addressed This Visit    Knee pain    Knee pain as outlined.  S/p left knee injections.  Continue f/u with ortho      Hypothyroid    On thyroid replacement.  Follow tsh.        Relevant Orders   TSH   Hypertension    Blood pressure as outlined.  Added hydralazine and increased dose to 25mg  tid.  He has only been taking bid.  Increase to tid. Follow pressures.  Follow metabolic panel.  Continue amlodipine and avapro.        Relevant Orders   CBC with Differential/Platelet   Basic metabolic panel   Hyperglycemia   Relevant Orders   Hemoglobin A1c   Hypercholesterolemia    Off cholesterol medication secondary to intolerance.  Low cholesterol diet and exercise.  Follow lipid panel and liver function tests.        Relevant Orders   Hepatic function panel   Lipid panel   Gout    On allopurinol.  No further gout flares.  Follow.        Other Visit Diagnoses    Need for influenza vaccination    -  Primary   Relevant Orders  Flu Vaccine QUAD High Dose(Fluad) (Completed)       Einar Pheasant, MD

## 2020-04-20 ENCOUNTER — Encounter: Payer: Self-pay | Admitting: Internal Medicine

## 2020-04-20 DIAGNOSIS — R739 Hyperglycemia, unspecified: Secondary | ICD-10-CM | POA: Insufficient documentation

## 2020-04-20 NOTE — Assessment & Plan Note (Signed)
Blood pressure as outlined.  Added hydralazine and increased dose to 25mg  tid.  He has only been taking bid.  Increase to tid. Follow pressures.  Follow metabolic panel.  Continue amlodipine and avapro.

## 2020-04-20 NOTE — Assessment & Plan Note (Signed)
Off cholesterol medication secondary to intolerance.  Low cholesterol diet and exercise.  Follow lipid panel and liver function tests.

## 2020-04-20 NOTE — Assessment & Plan Note (Signed)
On thyroid replacement.  Follow tsh.  

## 2020-04-20 NOTE — Assessment & Plan Note (Signed)
On allopurinol.  No further gout flares.  Follow.

## 2020-04-20 NOTE — Assessment & Plan Note (Signed)
Knee pain as outlined.  S/p left knee injections.  Continue f/u with ortho

## 2020-04-24 ENCOUNTER — Other Ambulatory Visit: Payer: Self-pay | Admitting: Internal Medicine

## 2020-05-02 ENCOUNTER — Other Ambulatory Visit: Payer: Self-pay | Admitting: Internal Medicine

## 2020-05-06 NOTE — Progress Notes (Signed)
PROVIDER NOTE: Information contained herein reflects review and annotations entered in association with encounter. Interpretation of such information and data should be left to medically-trained personnel. Information provided to patient can be located elsewhere in the medical record under "Patient Instructions". Document created using STT-dictation technology, any transcriptional errors that may result from process are unintentional.    Patient: Hector [name]  Service Category: E/M  Provider: Gaspar Cola, MD  DOB: 04-May-1935  DOS: 05/07/2020  Specialty: Interventional Pain Management  MRN: [id number]  Setting: Ambulatory outpatient  PCP: Einar Pheasant, MD  Type: Established Patient    Referring Provider: Einar Pheasant, MD  Location: Office  Delivery: Face-to-face     HPI  Mr. MACALLISTER ASHMEAD, a 85 y.o. year old male, is here today because of his Chronic pain syndrome [G89.4]. Mr. Hemme primary complain today is Back Pain Last encounter: My last encounter with him was on Visit date not found. Pertinent problems: Mr. Thompson has History of prostate cancer; Malignant melanoma ([place]); Shoulder pain, left; Chronic hip pain (Left); Chronic low back pain (Primary Area of Pain) (Bilateral) (L>R); Chronic pain syndrome; Polyarthritis, unspecified (Secondary Area of Pain); L5-S1 bilateral pars defect with spondylolisthesis; Lumbosacral Grade 1 Anterolisthesis of L5 over S1; Lumbar facet arthropathy (Bilateral); Lumbar foraminal stenosis (L5-S1) (Bilateral) (L>R); Chronic Lumbar radiculitis (L5) (Left); Chronic musculoskeletal pain; Lumbar facet syndrome (Bilateral) (L>R); Chronic lower extremity pain (Secondary Area of Pain) (Left); Chronic lower extremity radicular pain (L5) (Left); Lumbar facet osteoarthritis (Bilateral); Osteoarthritis of lumbar spine; Lumbar spondylosis; Spondylosis without myelopathy or radiculopathy, lumbosacral region; Chronic right shoulder pain; and DDD (degenerative disc disease),  lumbosacral on their pertinent problem list. Pain Assessment: Severity of Chronic pain is reported as a 1 /10. Location: Back Lower/Denies. Onset: More than a month ago. Quality: Aching, Burning. Timing: Intermittent. Modifying factor(s): Meds. Vitals:  height is '5\' 8"'  (1.727 m) and weight is 250 lb (113.4 kg). His temperature is 98.1 F (36.7 C). His blood pressure is 141/82 (abnormal) and his pulse is 56 (abnormal). His oxygen saturation is 98%.   Reason for encounter: medication management.  The patient indicates doing well with the current medication regimen. No adverse reactions or side effects reported to the medications.  The patient indicates that he had some hydrocodone left from the radiofrequencies that we had done for him in 2019.  He refers that when the pain gets really bad he will break a tablet in half and he takes that and it usually helps completely eliminate the pain.  He refers not having much in terms of lower extremity pain, but the low back pain is still the main thing that bothers him, occasionally.  He describes the low back pain as being bilateral with the right side being worse than the left.  I asked him if he wanted to repeat the radiofrequency ablation and he indicated that he feels he has not gotten to the point where he needs another one done yet.  However, he did tell us how good the radiofrequency work with him he had the radiofrequency of the lumbar facets on the right side done on 04/18/2018, as well as the left side which was done on 03/21/2018.  He refers that since he had the ulcer radiofrequencies, his pain has been under control and he has not needed any significant treatments for his pain other than taking the tramadol 50 mg 3 times daily.  I instructed the patient to give me a call as soon as the pain returns  and he wants to proceed with the radiofrequency.  He understood and accepted.  Today we will update his UDS.  When scheduling the radiofrequency, we need to make  sure that we give ourselves enough time since he is a poor candidate based on movement and body habitus.  Pharmacotherapy Assessment   Analgesic: Tramadol 50 mg, 1 tab PO q 8 hrs (150 mg/day of tramadol) MME/day:20m/day.   Monitoring: [place] PMP: PDMP reviewed during this encounter.       Pharmacotherapy: No side-effects or adverse reactions reported. Compliance: No problems identified. Effectiveness: Clinically acceptable.  BChauncey Fischer RN  05/07/2020 10:47 AM  Sign when Signing Visit Nursing Pain Medication Assessment:  Safety precautions to be maintained throughout the outpatient stay will include: orient to surroundings, keep bed in low position, maintain call bell within reach at all times, provide assistance with transfer out of bed and ambulation.  Medication Inspection Compliance: Pill count conducted under aseptic conditions, in front of the patient. Neither the pills nor the bottle was removed from the patient's sight at any time. Once count was completed pills were immediately returned to the patient in their original bottle.  Medication: Tramadol (Ultram) Pill/Patch Count: 6 of 90 pills remain Pill/Patch Appearance: Markings consistent with prescribed medication Bottle Appearance: Standard pharmacy container. Clearly labeled. Filled Date: 4 / 260/ 21 Last Medication intake:  TodaySafety precautions to be maintained throughout the outpatient stay will include: orient to surroundings, keep bed in low position, maintain call bell within reach at all times, provide assistance with transfer out of bed and ambulation.     UDS: No results found for: SUMMARY   ROS  Constitutional: Denies any fever or chills Gastrointestinal: No reported hemesis, hematochezia, vomiting, or acute GI distress Musculoskeletal: Denies any acute onset joint swelling, redness, loss of ROM, or weakness Neurological: No reported episodes of acute onset apraxia, aphasia, dysarthria, agnosia, amnesia,  paralysis, loss of coordination, or loss of consciousness  Medication Review  Calcium Plus D3 Absorbable, L-Lysine, Sennosides, acetaminophen, allopurinol, amLODipine, aspirin, calcium carbonate, fluticasone, hydrALAZINE, hyoscyamine, irbesartan, levothyroxine, omeprazole, rosuvastatin, traMADol, and valACYclovir  History Review  Allergy: Mr. CGalliis allergic to tape. Drug: Mr. CGoetting reports no history of drug use. Alcohol:  reports no history of alcohol use. Tobacco:  reports that he has never smoked. He has never used smokeless tobacco. Social: Mr. CCicero reports that he has never smoked. He has never used smokeless tobacco. He reports that he does not drink alcohol and does not use drugs. Medical:  has a past medical history of Acute postoperative pain (03/21/2018), Anemia, Arthritis, Barrett esophagus, [place], Chicken pox, Diverticulitis, Dysrhythmia, GERD (gastroesophageal reflux disease), Hyperlipidemia, Hypertension, Hypothyroidism, [place], Sleep apnea, and Ulcer. Surgical: Mr. CGunnoe has a past surgical history that includes Prostate surgery; cystocopy (2003); Hemorrhoid surgery; Cardiac catheterization; sleep study; Colon surgery (2006-2008-2011); Cataract Surgery (Right, 02/13/14); Esophagogastroduodenoscopy (egd) with propofol (N/A, 08/04/2016); Colonoscopy w/ polypectomy; Colonoscopy with propofol (N/A, 02/15/2018); Colonoscopy with propofol (N/A, 03/24/2020); and Esophagogastroduodenoscopy (egd) with propofol (N/A, 03/24/2020). Family: family history includes Heart disease in his father; Kidney disease in his father; Liver disease in his mother.  Laboratory Chemistry Profile   Renal Lab Results  Component Value Date   BUN 11 02/27/2020   CREATININE 1.18 02/27/2020   BCR 10 06/13/2017   GFR 58.99 (L) 02/27/2020   GFRAA >60 01/23/2018   GFRNONAA 56 (L) 01/23/2018     Hepatic Lab Results  Component Value Date   AST  33 02/27/2020   ALT 28 02/27/2020   ALBUMIN 3.9  02/27/2020   ALKPHOS 52 02/27/2020     Electrolytes Lab Results  Component Value Date   NA 140 02/27/2020   K 3.5 02/27/2020   CL 106 02/27/2020   CALCIUM 9.4 02/27/2020   MG 2.0 06/13/2017     Bone Lab Results  Component Value Date   VD25OH 21.46 (L) 03/28/2018   25OHVITD1 20 (L) 06/13/2017   25OHVITD2 <1.0 06/13/2017   25OHVITD3 20 06/13/2017     Inflammation (CRP: Acute Phase) (ESR: Chronic Phase) Lab Results  Component Value Date   CRP 2.8 06/13/2017   ESRSEDRATE 13 06/13/2017       Note: Above Lab results reviewed.  Recent Imaging Review  DG Knee 1-2 Views Right CLINICAL DATA:  Persistent increased knee pain affecting walking.  EXAM: RIGHT KNEE - 1-2 VIEW  COMPARISON:  None.  FINDINGS: No fracture. Mild medial compartment joint space narrowing. Trace patellofemoral spurs and minimal spurring of the tibial spines. Trace joint effusion. No erosion, periosteal reaction, or evidence of focal bone lesion. There are vascular calcifications.  IMPRESSION: Minimal osteoarthritis, most prominent in the medial compartment. Trace joint effusion.  Electronically Signed   By: Keith Rake M.D.   On: 02/29/2020 20:12 DG Knee 1-2 Views Left CLINICAL DATA:  Persistent increased knee pain affecting walking.  EXAM: LEFT KNEE - 1-2 VIEW  COMPARISON:  None.  FINDINGS: No fracture. Mild medial tibiofemoral joint space narrowing. No significant osteophytes. There is a small knee joint effusion. No erosion, periosteal reaction, or evidence of focal bone lesion. Mild generalized soft tissue edema. There are vascular calcifications.  IMPRESSION: Mild medial tibiofemoral joint space narrowing. Small knee joint effusion.  Electronically Signed   By: Keith Rake M.D.   On: 02/29/2020 20:12 Note: Reviewed        Physical Exam  General appearance: Well nourished, well developed, and well hydrated. In no apparent acute distress Mental status: Alert,  oriented x 3 (person, place, & time)       Respiratory: No evidence of acute respiratory distress Eyes: PERLA Vitals: BP (!) 141/82   Pulse (!) 56   Temp 98.1 F (36.7 C)   Ht '5\' 8"'  (1.727 m)   Wt 250 lb (113.4 kg)   SpO2 98%   BMI 38.01 kg/m  BMI: Estimated body mass index is 38.01 kg/m as calculated from the following:   Height as of this encounter: '5\' 8"'  (1.727 m).   Weight as of this encounter: 250 lb (113.4 kg). Ideal: Ideal body weight: 68.4 kg (150 lb 12.7 oz) Adjusted ideal body weight: 86.4 kg (190 lb 7.6 oz)  Assessment   Status Diagnosis  Controlled Controlled Controlled 1. Chronic pain syndrome   2. Chronic low back pain (Primary Area of Pain) (Bilateral) (L>R)   3. Chronic lower extremity pain (Secondary Area of Pain) (Left)   4. Pharmacologic therapy   5. Vitamin D deficiency   6. Osteopenia of multiple sites      Updated Problems: Problem  Osteopenia    Plan of Care  Problem-specific:  No problem-specific Assessment & Plan notes found for this encounter.  Mr. CAREY JOHNDROW has a current medication list which includes the following long-term medication(s): allopurinol, amlodipine, calcium plus d3 absorbable, fluticasone, hydralazine, hyoscyamine, irbesartan, levothyroxine, omeprazole, rosuvastatin, [START ON 05/10/2020] calcium carbonate, and [START ON 05/10/2020] tramadol.  Pharmacotherapy (Medications Ordered): Meds ordered this encounter  Medications  . traMADol (ULTRAM) 50 MG tablet  Sig: Take 1 tablet (50 mg total) by mouth every 8 (eight) hours as needed for severe pain.    Dispense:  270 tablet    Refill:  0    Chronic Pain: STOP Act (Not applicable) Fill 1 day early if closed on refill date. Avoid benzodiazepines within 8 hours of opioids  . calcium carbonate (CALCIUM 600) 600 MG TABS tablet    Sig: Take 2 tablets (1,200 mg total) by mouth daily with breakfast.    Dispense:  180 tablet    Refill:  0    Fill one day early if pharmacy is  closed on scheduled refill date. May substitute for generic, or similar, if available.   Orders:  Orders Placed This Encounter  Procedures  . ToxASSURE Select 13 (MW), Urine    Volume: 30 ml(s). Minimum 3 ml of urine is needed. Document temperature of fresh sample. Indications: Long term (current) use of opiate analgesic (W96.045)    Order Specific Question:   Release to patient    Answer:   Immediate   Follow-up plan:   Return in about 3 months (around 08/08/2020) for (F2F), (Med Mgmt).      Interventional management options: Planned, scheduled, and/or pending:      Considering:   Diagnostic left L5 TFESI  Possible left L5 nerve root ganglion RFA Diagnosticleft L4-5 LESI   Palliative PRN treatment(s):   Diagnostic midline caudal ESI #3  Palliativebilateral lumbar facet block #3 Palliative right lumbar facet RFA #2 (last done 04/18/2018) (difficult due to patient moving and anatomy.)  Palliative left lumbar facet RFA #2 (last done 03/21/2018) (difficult due to patient moving and anatomy.)     Recent Visits No visits were found meeting these conditions. Showing recent visits within past 90 days and meeting all other requirements Today's Visits Date Type Provider Dept  05/07/20 Office Visit Milinda Pointer, MD [facility]  Showing today's visits and meeting all other requirements Future Appointments No visits were found meeting these conditions. Showing future appointments within next 90 days and meeting all other requirements  I discussed the assessment and treatment plan with the patient. The patient was provided an opportunity to ask questions and all were answered. The patient agreed with the plan and demonstrated an understanding of the instructions.  Patient advised to call back or seek an in-person evaluation if the symptoms or condition worsens.  Duration of encounter: 32 minutes.  Note by: Gaspar Cola, MD Date: 05/07/2020; Time: 11:28 AM

## 2020-05-07 ENCOUNTER — Ambulatory Visit: Payer: Medicare Other | Attending: Pain Medicine | Admitting: Pain Medicine

## 2020-05-07 ENCOUNTER — Encounter: Payer: Self-pay | Admitting: Pain Medicine

## 2020-05-07 VITALS — BP 141/82 | HR 56 | Temp 98.1°F | Ht 68.0 in | Wt 250.0 lb

## 2020-05-07 DIAGNOSIS — M8580 Other specified disorders of bone density and structure, unspecified site: Secondary | ICD-10-CM | POA: Insufficient documentation

## 2020-05-07 DIAGNOSIS — G8929 Other chronic pain: Secondary | ICD-10-CM | POA: Diagnosis not present

## 2020-05-07 DIAGNOSIS — Z79899 Other long term (current) drug therapy: Secondary | ICD-10-CM | POA: Diagnosis not present

## 2020-05-07 DIAGNOSIS — M5442 Lumbago with sciatica, left side: Secondary | ICD-10-CM | POA: Diagnosis not present

## 2020-05-07 DIAGNOSIS — M8589 Other specified disorders of bone density and structure, multiple sites: Secondary | ICD-10-CM | POA: Insufficient documentation

## 2020-05-07 DIAGNOSIS — E559 Vitamin D deficiency, unspecified: Secondary | ICD-10-CM | POA: Diagnosis not present

## 2020-05-07 DIAGNOSIS — M79605 Pain in left leg: Secondary | ICD-10-CM | POA: Insufficient documentation

## 2020-05-07 DIAGNOSIS — G894 Chronic pain syndrome: Secondary | ICD-10-CM | POA: Diagnosis not present

## 2020-05-07 MED ORDER — CALCIUM CARBONATE 600 MG PO TABS: 1200.0000 mg | ORAL_TABLET | Freq: Every day | ORAL | 0 refills | Status: DC

## 2020-05-07 MED ORDER — TRAMADOL HCL 50 MG PO TABS: 50.0000 mg | ORAL_TABLET | Freq: Three times a day (TID) | ORAL | 0 refills | Status: DC | PRN

## 2020-05-07 NOTE — Progress Notes (Signed)
Nursing Pain Medication Assessment:  Safety precautions to be maintained throughout the outpatient stay will include: orient to surroundings, keep bed in low position, maintain call bell within reach at all times, provide assistance with transfer out of bed and ambulation.  Medication Inspection Compliance: Pill count conducted under aseptic conditions, in front of the patient. Neither the pills nor the bottle was removed from the patient's sight at any time. Once count was completed pills were immediately returned to the patient in their original bottle.  Medication: Tramadol (Ultram) Pill/Patch Count: 6 of 90 pills remain Pill/Patch Appearance: Markings consistent with prescribed medication Bottle Appearance: Standard pharmacy container. Clearly labeled. Filled Date: 4 / 73 / 21 Last Medication intake:  TodaySafety precautions to be maintained throughout the outpatient stay will include: orient to surroundings, keep bed in low position, maintain call bell within reach at all times, provide assistance with transfer out of bed and ambulation.

## 2020-05-07 NOTE — Patient Instructions (Signed)
____________________________________________________________________________________________  Medication Rules  Purpose: To inform patients, and their family members, of our rules and regulations.  Applies to: All patients receiving prescriptions (written or electronic).  Pharmacy of record: Pharmacy where electronic prescriptions will be sent. If written prescriptions are taken to a different pharmacy, please inform the nursing staff. The pharmacy listed in the electronic medical record should be the one where you would like electronic prescriptions to be sent.  Electronic prescriptions: In compliance with the [place] Strengthen Opioid Misuse Prevention (STOP) Act of 2017 (Session Law 2017-74/H243), effective August 02, 2018, all controlled substances must be electronically prescribed. Calling prescriptions to the pharmacy will cease to exist.  Prescription refills: Only during scheduled appointments. Applies to all prescriptions.  NOTE: The following applies primarily to controlled substances (Opioid* Pain Medications).   Type of encounter (visit): For patients receiving controlled substances, face-to-face visits are required. (Not an option or up to the patient.)  Patient's responsibilities: 1. Pain Pills: Bring all pain pills to every appointment (except for procedure appointments). 2. Pill Bottles: Bring pills in original pharmacy bottle. Always bring the newest bottle. Bring bottle, even if empty. 3. Medication refills: You are responsible for knowing and keeping track of what medications you take and those you need refilled. The day before your appointment: write a list of all prescriptions that need to be refilled. The day of the appointment: give the list to the admitting nurse. Prescriptions will be written only during appointments. No prescriptions will be written on procedure days. If you forget a medication: it will not be "Called in", "Faxed", or "electronically sent".  You will need to get another appointment to get these prescribed. No early refills. Do not call asking to have your prescription filled early. 4. Prescription Accuracy: You are responsible for carefully inspecting your prescriptions before leaving our office. Have the discharge nurse carefully go over each prescription with you, before taking them home. Make sure that your name is accurately spelled, that your address is correct. Check the name and dose of your medication to make sure it is accurate. Check the number of pills, and the written instructions to make sure they are clear and accurate. Make sure that you are given enough medication to last until your next medication refill appointment. 5. Taking Medication: Take medication as prescribed. When it comes to controlled substances, taking less pills or less frequently than prescribed is permitted and encouraged. Never take more pills than instructed. Never take medication more frequently than prescribed.  6. Inform other Doctors: Always inform, all of your healthcare providers, of all the medications you take. 7. Pain Medication from other Providers: You are not allowed to accept any additional pain medication from any other Doctor or Healthcare provider. There are two exceptions to this rule. (see below) In the event that you require additional pain medication, you are responsible for notifying us, as stated below. 8. Medication Agreement: You are responsible for carefully reading and following our Medication Agreement. This must be signed before receiving any prescriptions from our practice. Safely store a copy of your signed Agreement. Violations to the Agreement will result in no further prescriptions. (Additional copies of our Medication Agreement are available upon request.) 9. Laws, Rules, & Regulations: All patients are expected to follow all Federal and State Laws, Statutes, Rules, & Regulations. Ignorance of the Laws does not constitute a  valid excuse.  10. Illegal drugs and Controlled Substances: The use of illegal substances (including, but not limited to marijuana and its   derivatives) and/or the illegal use of any controlled substances is strictly prohibited. Violation of this rule may result in the immediate and permanent discontinuation of any and all prescriptions being written by our practice. The use of any illegal substances is prohibited. 11. Adopted CDC guidelines & recommendations: Target dosing levels will be at or below 60 MME/day. Use of benzodiazepines** is not recommended.  Exceptions: There are only two exceptions to the rule of not receiving pain medications from other Healthcare Providers. 1. Exception #1 (Emergencies): In the event of an emergency (i.e.: accident requiring emergency care), you are allowed to receive additional pain medication. However, you are responsible for: As soon as you are able, call our office [phone], at any time of the day or night, and leave a message stating your name, the date and nature of the emergency, and the name and dose of the medication prescribed. In the event that your call is answered by a member of our staff, make sure to document and save the date, time, and the name of the person that took your information.  2. Exception #2 (Planned Surgery): In the event that you are scheduled by another doctor or dentist to have any type of surgery or procedure, you are allowed (for a period no longer than 30 days), to receive additional pain medication, for the acute post-op pain. However, in this case, you are responsible for picking up a copy of our "Post-op Pain Management for Surgeons" handout, and giving it to your surgeon or dentist. This document is available at our office, and does not require an appointment to obtain it. Simply go to our office during business hours (Monday-Thursday from 8:00 AM to 4:00 PM) (Friday 8:00 AM to 12:00 Noon) or if you have a scheduled appointment  with us, prior to your surgery, and ask for it by name. In addition, you are responsible for: calling our office [phone], at any time of the day or night, and leaving a message stating your name, name of your surgeon, type of surgery, and date of procedure or surgery. Failure to comply with your responsibilities may result in termination of therapy involving the controlled substances.  *Opioid medications include: morphine, codeine, oxycodone, oxymorphone, hydrocodone, hydromorphone, meperidine, tramadol, tapentadol, buprenorphine, fentanyl, methadone. **Benzodiazepine medications include: diazepam (Valium), alprazolam (Xanax), clonazepam (Klonopine), lorazepam (Ativan), clorazepate (Tranxene), chlordiazepoxide (Librium), estazolam (Prosom), oxazepam (Serax), temazepam (Restoril), triazolam (Halcion) (Last updated: 04/08/2020) ____________________________________________________________________________________________   ____________________________________________________________________________________________  Medication Recommendations and Reminders  Applies to: All patients receiving prescriptions (written and/or electronic).  Medication Rules & Regulations: These rules and regulations exist for your safety and that of others. They are not flexible and neither are we. Dismissing or ignoring them will be considered "non-compliance" with medication therapy, resulting in complete and irreversible termination of such therapy. (See document titled "Medication Rules" for more details.) In all conscience, because of safety reasons, we cannot continue providing a therapy where the patient does not follow instructions.  Pharmacy of record:   Definition: This is the pharmacy where your electronic prescriptions will be sent.   We do not endorse any particular pharmacy, however, we have experienced problems with [facility] not securing enough medication supply for the community.  We do not  restrict you in your choice of pharmacy. However, once we write for your prescriptions, we will NOT be re-sending more prescriptions to fix restricted supply problems created by your pharmacy, or your insurance.   The pharmacy listed in the electronic medical record should be the   one where you want electronic prescriptions to be sent.  If you choose to change pharmacy, simply notify our nursing staff.  Recommendations:  Keep all of your pain medications in a safe place, under lock and key, even if you live alone. We will NOT replace lost, stolen, or damaged medication.  After you fill your prescription, take 1 week's worth of pills and put them away in a safe place. You should keep a separate, properly labeled bottle for this purpose. The remainder should be kept in the original bottle. Use this as your primary supply, until it runs out. Once it's gone, then you know that you have 1 week's worth of medicine, and it is time to come in for a prescription refill. If you do this correctly, it is unlikely that you will ever run out of medicine.  To make sure that the above recommendation works, it is very important that you make sure your medication refill appointments are scheduled at least 1 week before you run out of medicine. To do this in an effective manner, make sure that you do not leave the office without scheduling your next medication management appointment. Always ask the nursing staff to show you in your prescription , when your medication will be running out. Then arrange for the receptionist to get you a return appointment, at least 7 days before you run out of medicine. Do not wait until you have 1 or 2 pills left, to come in. This is very poor planning and does not take into consideration that we may need to cancel appointments due to bad weather, sickness, or emergencies affecting our staff.  DO NOT ACCEPT A "Partial Fill": If for any reason your pharmacy does not have enough pills/tablets  to completely fill or refill your prescription, do not allow for a "partial fill". The law allows the pharmacy to complete that prescription within 72 hours, without requiring a new prescription. If they do not fill the rest of your prescription within those 72 hours, you will need a separate prescription to fill the remaining amount, which we will NOT provide. If the reason for the partial fill is your insurance, you will need to talk to the pharmacist about payment alternatives for the remaining tablets, but again, DO NOT ACCEPT A PARTIAL FILL, unless you can trust your pharmacist to obtain the remainder of the pills within 72 hours.  Prescription refills and/or changes in medication(s):   Prescription refills, and/or changes in dose or medication, will be conducted only during scheduled medication management appointments. (Applies to both, written and electronic prescriptions.)  No refills on procedure days. No medication will be changed or started on procedure days. No changes, adjustments, and/or refills will be conducted on a procedure day. Doing so will interfere with the diagnostic portion of the procedure.  No phone refills. No medications will be "called into the pharmacy".  No Fax refills.  No weekend refills.  No Holliday refills.  No after hours refills.  Remember:  Business hours are:  Monday to Thursday 8:00 AM to 4:00 PM Provider's Schedule: Amanii Snethen, MD - Appointments are:  Medication management: Monday and Wednesday 8:00 AM to 4:00 PM Procedure day: Tuesday and Thursday 7:30 AM to 4:00 PM Bilal Lateef, MD - Appointments are:  Medication management: Tuesday and Thursday 8:00 AM to 4:00 PM Procedure day: Monday and Wednesday 7:30 AM to 4:00 PM (Last update: 02/20/2020) ____________________________________________________________________________________________  ____________________________________________________________________________________________  CBD  (cannabidiol) WARNING  Applicable to: All individuals currently taking   or considering taking CBD (cannabidiol) and, more important, all patients taking opioid analgesic controlled substances (pain medication). (Example: oxycodone; oxymorphone; hydrocodone; hydromorphone; morphine; methadone; tramadol; tapentadol; fentanyl; buprenorphine; butorphanol; dextromethorphan; meperidine; codeine; etc.)  Legal status: CBD remains a Schedule I drug prohibited for any use. CBD is illegal with one exception. In the [place], CBD has a limited Food and Drug Administration (FDA) approval for the treatment of two specific types of epilepsy disorders. Only one CBD product has been approved by the FDA for this purpose: "Epidiolex". FDA is aware that some companies are marketing products containing cannabis and cannabis-derived compounds in ways that violate the Federal Food, Drug and Cosmetic Act (FD&C Act) and that may put the health and safety of consumers at risk. The FDA, a Federal agency, has not enforced the CBD status since 2018.   Legality: Some manufacturers ship CBD products nationally, which is illegal. Often such products are sold online and are therefore available throughout the country. CBD is openly sold in head shops and health food stores in some states where such sales have not been explicitly legalized. Selling unapproved products with unsubstantiated therapeutic claims is not only a violation of the law, but also can put patients at risk, as these products have not been proven to be safe or effective. Federal illegality makes it difficult to conduct research on CBD.  Reference: "FDA Regulation of Cannabis and Cannabis-Derived Products, Including Cannabidiol (CBD)" - https://www.fda.gov/news-events/public-health-focus/fda-regulation-cannabis-and-cannabis-derived-products-including-cannabidiol-cbd  Warning: CBD is not FDA approved and has not undergo the same manufacturing controls as prescription  drugs.  This means that the purity and safety of available CBD may be questionable. Most of the time, despite manufacturer's claims, it is contaminated with THC (delta-9-tetrahydrocannabinol - the chemical in marijuana responsible for the "HIGH").  When this is the case, the THC contaminant will trigger a positive urine drug screen (UDS) test for Marijuana (carboxy-THC). Because a positive UDS for any illicit substance is a violation of our medication agreement, your opioid analgesics (pain medicine) may be permanently discontinued.  MORE ABOUT CBD  General Information: CBD  is a derivative of the Marijuana (cannabis sativa) plant discovered in 1940. It is one of the 113 identified substances found in Marijuana. It accounts for up to 40% of the plant's extract. As of 2018, preliminary clinical studies on CBD included research for the treatment of anxiety, movement disorders, and pain. CBD is available and consumed in multiple forms, including inhalation of smoke or vapor, as an aerosol spray, and by mouth. It may be supplied as an oil containing CBD, capsules, dried cannabis, or as a liquid solution. CBD is thought not to be as psychoactive as THC (delta-9-tetrahydrocannabinol - the chemical in marijuana responsible for the "HIGH"). Studies suggest that CBD may interact with different biological target receptors in the body, including cannabinoid and other neurotransmitter receptors. As of 2018 the mechanism of action for its biological effects has not been determined.  Side-effects  Adverse reactions: Dry mouth, diarrhea, decreased appetite, fatigue, drowsiness, malaise, weakness, sleep disturbances, and others.  Drug interactions: CBC may interact with other medications such as blood-thinners. (Last update:  03/08/2020) ____________________________________________________________________________________________   ____________________________________________________________________________________________  Drug Holidays (Slow)  What is a "Drug Holiday"? Drug Holiday: is the name given to the period of time during which a patient stops taking a medication(s) for the purpose of eliminating tolerance to the drug.  Benefits . Improved effectiveness of opioids. . Decreased opioid dose needed to achieve benefits. . Improved pain with lesser dose.  What   is tolerance? Tolerance: is the progressive decreased in effectiveness of a drug due to its repetitive use. With repetitive use, the body gets use to the medication and as a consequence, it loses its effectiveness. This is a common problem seen with opioid pain medications. As a result, a larger dose of the drug is needed to achieve the same effect that used to be obtained with a smaller dose.  How long should a "Drug Holiday" last? You should stay off of the pain medicine for at least 14 consecutive days. (2 weeks)  Should I stop the medicine "cold turkey"? No. You should always coordinate with your Pain Specialist so that he/she can provide you with the correct medication dose to make the transition as smoothly as possible.  How do I stop the medicine? Slowly. You will be instructed to decrease the daily amount of pills that you take by one (1) pill every seven (7) days. This is called a "slow downward taper" of your dose. For example: if you normally take four (4) pills per day, you will be asked to drop this dose to three (3) pills per day for seven (7) days, then to two (2) pills per day for seven (7) days, then to one (1) per day for seven (7) days, and at the end of those last seven (7) days, this is when the "Drug Holiday" would start.   Will I have withdrawals? By doing a "slow downward taper" like this one, it is unlikely that you will  experience any significant withdrawal symptoms. Typically, what triggers withdrawals is the sudden stop of a high dose opioid therapy. Withdrawals can usually be avoided by slowly decreasing the dose over a prolonged period of time. If you do not follow these instructions and decide to stop your medication abruptly, withdrawals may be possible.  What are withdrawals? Withdrawals: refers to the wide range of symptoms that occur after stopping or dramatically reducing opiate drugs after heavy and prolonged use. Withdrawal symptoms do not occur to patients that use low dose opioids, or those who take the medication sporadically. Contrary to benzodiazepine (example: Valium, Xanax, etc.) or alcohol withdrawals ("Delirium Tremens"), opioid withdrawals are not lethal. Withdrawals are the physical manifestation of the body getting rid of the excess receptors.  Expected Symptoms Early symptoms of withdrawal may include: . Agitation . Anxiety . Muscle aches . Increased tearing . Insomnia . Runny nose . Sweating . Yawning  Late symptoms of withdrawal may include: . Abdominal cramping . Diarrhea . Dilated pupils . Goose bumps . Nausea . Vomiting  Will I experience withdrawals? Due to the slow nature of the taper, it is very unlikely that you will experience any.  What is a slow taper? Taper: refers to the gradual decrease in dose.  (Last update: 02/20/2020) ____________________________________________________________________________________________      

## 2020-05-11 LAB — TOXASSURE SELECT 13 (MW), URINE

## 2020-05-22 ENCOUNTER — Other Ambulatory Visit: Payer: Self-pay | Admitting: Internal Medicine

## 2020-05-29 ENCOUNTER — Ambulatory Visit (INDEPENDENT_AMBULATORY_CARE_PROVIDER_SITE_OTHER): Payer: Medicare Other

## 2020-05-29 VITALS — Ht 68.0 in | Wt 250.0 lb

## 2020-05-29 DIAGNOSIS — Z0000 Encounter for general adult medical examination without abnormal findings: Secondary | ICD-10-CM | POA: Diagnosis not present

## 2020-05-29 NOTE — Progress Notes (Addendum)
Subjective:   Hector [name] is a 85 y.o. male who presents for Medicare Annual/Subsequent preventive examination.  Review of Systems    No ROS.  Medicare Wellness Virtual Visit.   Cardiac Risk Factors include: advanced age (>65men, >30 women);male gender;hypertension     Objective:    Today's Vitals   05/29/20 0934  Weight: 250 lb (113.4 kg)  Height: 5\' 8"  (1.727 m)   Body mass index is 38.01 kg/m.  Advanced Directives 05/29/2020 03/24/2020 07/03/2019 05/29/2019 05/25/2018 03/21/2018 10/27/2017  Does Patient Have a Medical Advance Directive? Yes Yes Yes Yes Yes Yes Yes  Type of Paramedic of [place];Living will Living will Healthcare Power of [place];Living will [place];Living will [place];Living will Living will;Healthcare Power of Attorney  Does patient want to make changes to medical advance directive? No - Patient declined - - No - Patient declined No - Patient declined - -  Copy of [place] in Chart? Yes - validated most recent copy scanned in chart (See row information) - - Yes - validated most recent copy scanned in chart (See row information) Yes - -    Current Medications (verified) Outpatient Encounter Medications as of 05/29/2020  Medication Sig  . acetaminophen (TYLENOL) 500 MG tablet Take 500 mg by mouth every 6 (six) hours as needed.  Marland Kitchen allopurinol (ZYLOPRIM) 100 MG tablet Take 1 tablet (100 mg total) by mouth daily.  Marland Kitchen amLODipine (NORVASC) 10 MG tablet TAKE 1 TABLET BY MOUTH DAILY  . aspirin 81 MG tablet Take 81 mg by mouth daily.  . Calcium Carb-Cholecalciferol (CALCIUM PLUS D3 ABSORBABLE) [phone] MG-UNIT CAPS Take 1 capsule by mouth daily with breakfast.  . calcium carbonate (CALCIUM 600) 600 MG TABS tablet Take 2 tablets (1,200 mg total) by mouth daily with breakfast.  . fluticasone (FLONASE) 50 MCG/ACT nasal spray TWO PUFFS IN EACH NOSTRIL ONCE A  DAY  . hydrALAZINE (APRESOLINE) 25 MG tablet Take 1 tablet (25 mg total) by mouth 3 (three) times daily. (Patient taking differently: Take 25 mg by mouth 3 (three) times daily. )  . hyoscyamine (LEVSIN, ANASPAZ) 0.125 MG tablet Take 0.125 mg by mouth as needed.  . irbesartan (AVAPRO) 300 MG tablet TAKE 1 TABLET BY MOUTH DAILY  . L-Lysine 500 MG TABS Take by mouth daily.  Marland Kitchen levothyroxine (SYNTHROID) 100 MCG tablet TAKE 1 TABLET BY MOUTH DAILY WITH BREAKFAST  . omeprazole (PRILOSEC) 20 MG capsule TAKE ONE CAPSULE BY MOUTH TWICE A DAY  . rosuvastatin (CRESTOR) 5 MG tablet TAKE 1 TABLET BY MOUTH DAILY (Patient not taking: Reported on 05/29/2020)  . Sennosides (LAX-PILLS PO) Take 2 mg by mouth.  . traMADol (ULTRAM) 50 MG tablet Take 1 tablet (50 mg total) by mouth every 8 (eight) hours as needed for severe pain.  . valACYclovir (VALTREX) 1000 MG tablet TAKE 1 TABLET BY MOUTH DAILY   No facility-administered encounter medications on file as of 05/29/2020.    Allergies (verified) Tape   History: Past Medical History:  Diagnosis Date  . Acute postoperative pain 03/21/2018  . Anemia   . Arthritis   . Barrett esophagus   . Cancer ([place])    prostate,skin  . Chicken pox   . Diverticulitis   . Dysrhythmia   . GERD (gastroesophageal reflux disease)   . Hyperlipidemia   . Hypertension   . Hypothyroidism   . Melanoma ([place])    Malignant resection  . Sleep apnea   .  Ulcer    Past Surgical History:  Procedure Laterality Date  . CARDIAC CATHETERIZATION    . Cataract Surgery Right 02/13/14  . COLON SURGERY  2006-2008-2011   polyps removed  . COLONOSCOPY W/ POLYPECTOMY    . COLONOSCOPY WITH PROPOFOL N/A 02/15/2018   Procedure: COLONOSCOPY WITH PROPOFOL;  Surgeon: Manya Silvas, MD;  [facility]: [facility] ENDOSCOPY;  Service: Endoscopy;  Laterality: N/A;  . COLONOSCOPY WITH PROPOFOL N/A 03/24/2020   Procedure: COLONOSCOPY WITH PROPOFOL;  Surgeon: Lesly Rubenstein, MD;  Location: [facility] ENDOSCOPY;   Service: Endoscopy;  Laterality: N/A;  . cystocopy  2003  . ESOPHAGOGASTRODUODENOSCOPY (EGD) WITH PROPOFOL N/A 08/04/2016   Procedure: ESOPHAGOGASTRODUODENOSCOPY (EGD) WITH PROPOFOL;  Surgeon: Manya Silvas, MD;  Location: [facility] ENDOSCOPY;  Service: Endoscopy;  Laterality: N/A;  . ESOPHAGOGASTRODUODENOSCOPY (EGD) WITH PROPOFOL N/A 03/24/2020   Procedure: ESOPHAGOGASTRODUODENOSCOPY (EGD) WITH PROPOFOL;  Surgeon: Lesly Rubenstein, MD;  Location: [facility] ENDOSCOPY;  Service: Endoscopy;  Laterality: N/A;  . HEMORRHOID SURGERY    . PROSTATE SURGERY    . sleep study     Family History  Problem Relation Age of Onset  . Liver disease Mother   . Heart disease Father   . Kidney disease Father    Social History   Socioeconomic History  . Marital status: Married    Spouse name: Not on file  . Number of children: Not on file  . Years of education: Not on file  . Highest education level: Not on file  Occupational History  . Not on file  Tobacco Use  . Smoking status: Never Smoker  . Smokeless tobacco: Never Used  Vaping Use  . Vaping Use: Never used  Substance and Sexual Activity  . Alcohol use: No    Alcohol/week: 0.0 standard drinks  . Drug use: No  . Sexual activity: Never  Other Topics Concern  . Not on file  Social History Narrative  . Not on file   Social Determinants of Health   Financial Resource Strain: Low Risk   . Difficulty of Paying Living Expenses: Not hard at all  Food Insecurity: No Food Insecurity  . Worried About Charity fundraiser in the Last Year: Never true  . Ran Out of Food in the Last Year: Never true  Transportation Needs: No Transportation Needs  . Lack of Transportation (Medical): No  . Lack of Transportation (Non-Medical): No  Physical Activity:   . Days of Exercise per Week: Not on file  . Minutes of Exercise per Session: Not on file  Stress: No Stress Concern Present  . Feeling of Stress : Not at all  Social Connections: Unknown  . Frequency of  Communication with Friends and Family: Not on file  . Frequency of Social Gatherings with Friends and Family: Not on file  . Attends Religious Services: Not on file  . Active Member of Clubs or Organizations: Not on file  . Attends Archivist Meetings: Not on file  . Marital Status: Married    Tobacco Counseling Counseling given: Not Answered   Clinical Intake:  Pre-visit preparation completed: Yes        Diabetes: No  How often do you need to have someone help you when you read instructions, pamphlets, or other written materials from your doctor or pharmacy?: 1 - Never   Interpreter Needed?: No      Activities of Daily Living In your present state of health, do you have any difficulty performing the following activities: 05/29/2020  Hearing? N  Vision? N  Difficulty concentrating or making decisions? N  Walking or climbing stairs? N  Dressing or bathing? N  Doing errands, shopping? N  Preparing Food and eating ? N  Using the Toilet? N  In the past six months, have you accidently leaked urine? N  Do you have problems with loss of bowel control? N  Managing your Medications? N  Managing your Finances? N  Housekeeping or managing your Housekeeping? N  Some recent data might be hidden    Patient Care Team: Einar Pheasant, MD as PCP - General (Internal Medicine) Einar Pheasant, MD (Internal Medicine)  Indicate any recent Medical Services you may have received from other than [facility] providers in the past year (date may be approximate).     Assessment:   This is a routine wellness examination for Hector [name].  I connected with Miley today by telephone and verified that I am speaking with the correct person using two identifiers. Location patient: home Location provider: work Persons participating in the virtual visit: patient, Marine scientist.    I discussed the limitations, risks, security and privacy concerns of performing an evaluation and management service by  telephone and the availability of in person appointments. The patient expressed understanding and verbally consented to this telephonic visit.    Interactive audio and video telecommunications were attempted between this provider and patient, however failed, due to patient having technical difficulties OR patient did not have access to video capability.  We continued and completed visit with audio only.  Some vital signs may be absent or patient reported.   Hearing/Vision screen  Hearing Screening   125Hz  250Hz  500Hz  1000Hz  2000Hz  3000Hz  4000Hz  6000Hz  8000Hz   Right ear:           Left ear:           Comments: Patient is able to hear conversational tones without difficulty. No issues reported.  Vision Screening Comments: Wears corrective lenses  Visual acuity not assessed, virtual visit  Dietary issues and exercise activities discussed: Current Exercise Habits: The patient does not participate in regular exercise at present  Healthy diet Good water intake  Goals    . Healthy Lifestyle     Stay active and hydrated Healthy diet; portion control        Depression Screen PHQ 2/9 Scores 05/29/2020 04/14/2020 05/29/2019 05/25/2018 04/18/2018 03/21/2018 10/27/2017  PHQ - 2 Score 0 0 0 0 0 0 0  PHQ- 9 Score - - - - - - -    Fall Risk Fall Risk  05/29/2020 05/07/2020 04/14/2020 12/19/2019 09/17/2019  Falls in the past year? 0 0 0 0 0  Number falls in past yr: 0 - 0 - -  Injury with Fall? - - 0 - -  Comment - - - - -  Follow up Falls evaluation completed - Falls evaluation completed Falls evaluation completed Falls evaluation completed   Handrails in use when climbing stairs? Yes Home free of loose throw rugs in walkways, pet beds, electrical cords, etc? Yes  Adequate lighting in your home to reduce risk of falls? Yes   ASSISTIVE DEVICES UTILIZED TO PREVENT FALLS: Use of a cane, walker or w/c? No   TIMED UP AND GO: Was the test performed? No . Virtual visit.   Cognitive  Function: Patient is alert and oriented x3.  Denies difficulty focusing, making decisions, memory loss.   MMSE - Mini Mental State Exam 05/24/2016 05/13/2015  Orientation to time 5 5  Orientation to Place  5 5  Registration 3 3  Attention/ Calculation 5 5  Recall 3 3  Language- name 2 objects 2 2  Language- repeat 1 1  Language- follow 3 step command 3 3  Language- read & follow direction 1 1  Write a sentence 1 1  Copy design 1 1  Total score 30 30     6CIT Screen 05/29/2020 05/29/2019 05/25/2018 05/24/2017  What Year? 0 points 0 points 0 points 0 points  What month? 0 points 0 points 0 points 0 points  What time? 0 points 0 points 0 points 0 points  Count back from 20 0 points 0 points 0 points 0 points  Months in reverse - 0 points 0 points 0 points  Repeat phrase - 0 points 0 points 0 points  Total Score - 0 0 0    Immunizations Immunization History  Administered Date(s) Administered  . Fluad Quad(high Dose 65+) 04/25/2019, 04/14/2020  . Influenza Split 05/03/2012, 05/01/2014  . Influenza Whole 05/09/2017  . Influenza, High Dose Seasonal PF 04/13/2016, 03/30/2018  . Influenza,inj,Quad PF,6+ Mos 05/13/2015  . PFIZER SARS-COV-2 Vaccination 08/16/2019, 09/06/2019  . Pneumococcal Conjugate-13 04/17/2014  . Pneumococcal Polysaccharide-23 05/24/2016  . Td 04/17/2014  . Zoster 05/02/2010   Health Maintenance There are no preventive care reminders to display for this patient. Health Maintenance  Topic Date Due  . TETANUS/TDAP  04/17/2024  . INFLUENZA VACCINE  Completed  . COVID-19 Vaccine  Completed  . PNA vac Low Risk Adult  Completed    Colorectal cancer screening: No longer required.   Lung Cancer Screening: (Low Dose CT Chest recommended if Age 66-80 years, 30 pack-year currently smoking OR have quit w/in 15years.) does not qualify.   Medication- Not taking Crestor for about 3 months due to knee pain.   Hepatitis C Screening: does not qualify.  Dental  Screening: Recommended annual dental exams for proper oral hygiene. Upper denture/top plate worn.   Community Resource Referral / Chronic Care Management: CRR required this visit?  No   CCM required this visit?  No      Plan:   Keep all routine maintenance appointments.   2 month follow up 06/16/20 @ 8:00  I have personally reviewed and noted the following in the patient's chart:   . Medical and social history . Use of alcohol, tobacco or illicit drugs  . Current medications and supplements . Functional ability and status . Nutritional status . Physical activity . Advanced directives . List of other physicians . Hospitalizations, surgeries, and ER visits in previous 12 months . Vitals . Screenings to include cognitive, depression, and falls . Referrals and appointments  In addition, I have reviewed and discussed with patient certain preventive protocols, quality metrics, and best practice recommendations. A written personalized care plan for preventive services as well as general preventive health recommendations were provided to patient via mychart.     Varney Biles, LPN   [id number]    Reviewed above information.  Agree with assessment and plan.   Dr Nicki Reaper

## 2020-05-29 NOTE — Patient Instructions (Addendum)
Hector [name] , Thank you for taking time to come for your Medicare Wellness Visit. I appreciate your ongoing commitment to your health goals. Please review the following plan we discussed and let me know if I can assist you in the future.   These are the goals we discussed: Goals    . Healthy Lifestyle     Stay active and hydrated Healthy diet; portion control         This is a list of the screening recommended for you and due dates:  Health Maintenance  Topic Date Due  . Tetanus Vaccine  04/17/2024  . Flu Shot  Completed  . COVID-19 Vaccine  Completed  . Pneumonia vaccines  Completed    Immunizations Immunization History  Administered Date(s) Administered  . Fluad Quad(high Dose 65+) 04/25/2019, 04/14/2020  . Influenza Split 05/03/2012, 05/01/2014  . Influenza Whole 05/09/2017  . Influenza, High Dose Seasonal PF 04/13/2016, 03/30/2018  . Influenza,inj,Quad PF,6+ Mos 05/13/2015  . PFIZER SARS-COV-2 Vaccination 08/16/2019, 09/06/2019  . Pneumococcal Conjugate-13 04/17/2014  . Pneumococcal Polysaccharide-23 05/24/2016  . Td 04/17/2014  . Zoster 05/02/2010   Keep all routine maintenance appointments.   2 month follow up 06/16/20 @ 8:00  Advanced directives: yes on file  Conditions/risks identified: none new.  Follow up in one year for your annual wellness visit.   Preventive Care 47 Years and Older, Male Preventive care refers to lifestyle choices and visits with your health care provider that can promote health and wellness. What does preventive care include?  A yearly physical exam. This is also called an annual well check.  Dental exams once or twice a year.  Routine eye exams. Ask your health care provider how often you should have your eyes checked.  Personal lifestyle choices, including:  Daily care of your teeth and gums.  Regular physical activity.  Eating a healthy diet.  Avoiding tobacco and drug use.  Limiting alcohol use.  Practicing safe  sex.  Taking low doses of aspirin every day.  Taking vitamin and mineral supplements as recommended by your health care provider. What happens during an annual well check? The services and screenings done by your health care provider during your annual well check will depend on your age, overall health, lifestyle risk factors, and family history of disease. Counseling  Your health care provider may ask you questions about your:  Alcohol use.  Tobacco use.  Drug use.  Emotional well-being.  Home and relationship well-being.  Sexual activity.  Eating habits.  History of falls.  Memory and ability to understand (cognition).  Work and work Statistician. Screening  You may have the following tests or measurements:  Height, weight, and BMI.  Blood pressure.  Lipid and cholesterol levels. These may be checked every 5 years, or more frequently if you are over 102 years old.  Skin check.  Lung cancer screening. You may have this screening every year starting at age 86 if you have a 30-pack-year history of smoking and currently smoke or have quit within the past 15 years.  Fecal occult blood test (FOBT) of the stool. You may have this test every year starting at age 16.  Flexible sigmoidoscopy or colonoscopy. You may have a sigmoidoscopy every 5 years or a colonoscopy every 10 years starting at age 69.  Prostate cancer screening. Recommendations will vary depending on your family history and other risks.  Hepatitis C blood test.  Hepatitis B blood test.  Sexually transmitted disease (STD) testing.  Diabetes screening.  This is done by checking your blood sugar (glucose) after you have not eaten for a while (fasting). You may have this done every 1-3 years.  Abdominal aortic aneurysm (AAA) screening. You may need this if you are a current or former smoker.  Osteoporosis. You may be screened starting at age 22 if you are at high risk. Talk with your health care provider  about your test results, treatment options, and if necessary, the need for more tests. Vaccines  Your health care provider may recommend certain vaccines, such as:  Influenza vaccine. This is recommended every year.  Tetanus, diphtheria, and acellular pertussis (Tdap, Td) vaccine. You may need a Td booster every 10 years.  Zoster vaccine. You may need this after age 40.  Pneumococcal 13-valent conjugate (PCV13) vaccine. One dose is recommended after age 54.  Pneumococcal polysaccharide (PPSV23) vaccine. One dose is recommended after age 67. Talk to your health care provider about which screenings and vaccines you need and how often you need them. This information is not intended to replace advice given to you by your health care provider. Make sure you discuss any questions you have with your health care provider. Document Released: 08/15/2015 Document Revised: 04/07/2016 Document Reviewed: 05/20/2015 Elsevier Interactive Patient Education  2017 [place] Prevention in the Home Falls can cause injuries. They can happen to people of all ages. There are many things you can do to make your home safe and to help prevent falls. What can I do on the outside of my home?  Regularly fix the edges of walkways and driveways and fix any cracks.  Remove anything that might make you trip as you walk through a door, such as a raised step or threshold.  Trim any bushes or trees on the path to your home.  Use bright outdoor lighting.  Clear any walking paths of anything that might make someone trip, such as rocks or tools.  Regularly check to see if handrails are loose or broken. Make sure that both sides of any steps have handrails.  Any raised decks and porches should have guardrails on the edges.  Have any leaves, snow, or ice cleared regularly.  Use sand or salt on walking paths during winter.  Clean up any spills in your garage right away. This includes oil or grease  spills. What can I do in the bathroom?  Use night lights.  Install grab bars by the toilet and in the tub and shower. Do not use towel bars as grab bars.  Use non-skid mats or decals in the tub or shower.  If you need to sit down in the shower, use a plastic, non-slip stool.  Keep the floor dry. Clean up any water that spills on the floor as soon as it happens.  Remove soap buildup in the tub or shower regularly.  Attach bath mats securely with double-sided non-slip rug tape.  Do not have throw rugs and other things on the floor that can make you trip. What can I do in the bedroom?  Use night lights.  Make sure that you have a light by your bed that is easy to reach.  Do not use any sheets or blankets that are too big for your bed. They should not hang down onto the floor.  Have a firm chair that has side arms. You can use this for support while you get dressed.  Do not have throw rugs and other things on the floor that can make you  trip. What can I do in the kitchen?  Clean up any spills right away.  Avoid walking on wet floors.  Keep items that you use a lot in easy-to-reach places.  If you need to reach something above you, use a strong step stool that has a grab bar.  Keep electrical cords out of the way.  Do not use floor polish or wax that makes floors slippery. If you must use wax, use non-skid floor wax.  Do not have throw rugs and other things on the floor that can make you trip. What can I do with my stairs?  Do not leave any items on the stairs.  Make sure that there are handrails on both sides of the stairs and use them. Fix handrails that are broken or loose. Make sure that handrails are as long as the stairways.  Check any carpeting to make sure that it is firmly attached to the stairs. Fix any carpet that is loose or worn.  Avoid having throw rugs at the top or bottom of the stairs. If you do have throw rugs, attach them to the floor with carpet  tape.  Make sure that you have a light switch at the top of the stairs and the bottom of the stairs. If you do not have them, ask someone to add them for you. What else can I do to help prevent falls?  Wear shoes that:  Do not have high heels.  Have rubber bottoms.  Are comfortable and fit you well.  Are closed at the toe. Do not wear sandals.  If you use a stepladder:  Make sure that it is fully opened. Do not climb a closed stepladder.  Make sure that both sides of the stepladder are locked into place.  Ask someone to hold it for you, if possible.  Clearly mark and make sure that you can see:  Any grab bars or handrails.  First and last steps.  Where the edge of each step is.  Use tools that help you move around (mobility aids) if they are needed. These include:  Canes.  Walkers.  Scooters.  Crutches.  Turn on the lights when you go into a dark area. Replace any light bulbs as soon as they burn out.  Set up your furniture so you have a clear path. Avoid moving your furniture around.  If any of your floors are uneven, fix them.  If there are any pets around you, be aware of where they are.  Review your medicines with your doctor. Some medicines can make you feel dizzy. This can increase your chance of falling. Ask your doctor what other things that you can do to help prevent falls. This information is not intended to replace advice given to you by your health care provider. Make sure you discuss any questions you have with your health care provider. Document Released: 05/15/2009 Document Revised: 12/25/2015 Document Reviewed: 08/23/2014 Elsevier Interactive Patient Education  2017 [employer].

## 2020-06-06 ENCOUNTER — Other Ambulatory Visit: Payer: Self-pay | Admitting: Internal Medicine

## 2020-06-11 ENCOUNTER — Other Ambulatory Visit: Payer: Self-pay

## 2020-06-16 ENCOUNTER — Ambulatory Visit (INDEPENDENT_AMBULATORY_CARE_PROVIDER_SITE_OTHER): Payer: Medicare Other | Admitting: Internal Medicine

## 2020-06-16 ENCOUNTER — Other Ambulatory Visit: Payer: Self-pay

## 2020-06-16 ENCOUNTER — Other Ambulatory Visit: Payer: Self-pay | Admitting: Internal Medicine

## 2020-06-16 VITALS — BP 128/76 | HR 70 | Temp 97.6°F | Resp 16 | Ht 68.0 in | Wt 250.0 lb

## 2020-06-16 DIAGNOSIS — R739 Hyperglycemia, unspecified: Secondary | ICD-10-CM | POA: Diagnosis not present

## 2020-06-16 DIAGNOSIS — K2270 Barrett's esophagus without dysplasia: Secondary | ICD-10-CM

## 2020-06-16 DIAGNOSIS — I499 Cardiac arrhythmia, unspecified: Secondary | ICD-10-CM | POA: Diagnosis not present

## 2020-06-16 DIAGNOSIS — E7800 Pure hypercholesterolemia, unspecified: Secondary | ICD-10-CM

## 2020-06-16 DIAGNOSIS — M25561 Pain in right knee: Secondary | ICD-10-CM

## 2020-06-16 DIAGNOSIS — E039 Hypothyroidism, unspecified: Secondary | ICD-10-CM

## 2020-06-16 DIAGNOSIS — I10 Essential (primary) hypertension: Secondary | ICD-10-CM

## 2020-06-16 DIAGNOSIS — M25562 Pain in left knee: Secondary | ICD-10-CM

## 2020-06-16 DIAGNOSIS — M47816 Spondylosis without myelopathy or radiculopathy, lumbar region: Secondary | ICD-10-CM

## 2020-06-16 DIAGNOSIS — Z8546 Personal history of malignant neoplasm of prostate: Secondary | ICD-10-CM | POA: Diagnosis not present

## 2020-06-16 DIAGNOSIS — E538 Deficiency of other specified B group vitamins: Secondary | ICD-10-CM

## 2020-06-16 DIAGNOSIS — E559 Vitamin D deficiency, unspecified: Secondary | ICD-10-CM

## 2020-06-16 LAB — CBC WITH DIFFERENTIAL/PLATELET
Basophils Absolute: 0 10*3/uL (ref 0.0–0.1)
Basophils Relative: 0.5 % (ref 0.0–3.0)
Eosinophils Absolute: 0.1 10*3/uL (ref 0.0–0.7)
Eosinophils Relative: 1.4 % (ref 0.0–5.0)
HCT: 45.9 % (ref 39.0–52.0)
Hemoglobin: 15.4 g/dL (ref 13.0–17.0)
Lymphocytes Relative: 24.6 % (ref 12.0–46.0)
Lymphs Abs: 2.2 10*3/uL (ref 0.7–4.0)
MCHC: 33.5 g/dL (ref 30.0–36.0)
MCV: 92.7 fl (ref 78.0–100.0)
Monocytes Absolute: 0.8 10*3/uL (ref 0.1–1.0)
Monocytes Relative: 8.8 % (ref 3.0–12.0)
Neutro Abs: 5.8 10*3/uL (ref 1.4–7.7)
Neutrophils Relative %: 64.7 % (ref 43.0–77.0)
Platelets: 243 10*3/uL (ref 150.0–400.0)
RBC: 4.95 Mil/uL (ref 4.22–5.81)
RDW: 13.9 % (ref 11.5–15.5)
WBC: 9 10*3/uL (ref 4.0–10.5)

## 2020-06-16 LAB — HEPATIC FUNCTION PANEL
ALT: 29 U/L (ref 0–53)
AST: 24 U/L (ref 0–37)
Albumin: 4.3 g/dL (ref 3.5–5.2)
Alkaline Phosphatase: 68 U/L (ref 39–117)
Bilirubin, Direct: 0.1 mg/dL (ref 0.0–0.3)
Total Bilirubin: 0.6 mg/dL (ref 0.2–1.2)
Total Protein: 7.1 g/dL (ref 6.0–8.3)

## 2020-06-16 LAB — BASIC METABOLIC PANEL
BUN: 17 mg/dL (ref 6–23)
CO2: 30 mEq/L (ref 19–32)
Calcium: 9.6 mg/dL (ref 8.4–10.5)
Chloride: 102 mEq/L (ref 96–112)
Creatinine, Ser: 1.28 mg/dL (ref 0.40–1.50)
GFR: 51.93 mL/min — ABNORMAL LOW (ref >60.00)
Glucose, Bld: 88 mg/dL (ref 70–99)
Potassium: 4.5 mEq/L (ref 3.5–5.1)
Sodium: 141 mEq/L (ref 135–145)

## 2020-06-16 LAB — LIPID PANEL
Cholesterol: 199 mg/dL (ref 0–200)
HDL: 58.3 mg/dL (ref >39.00)
LDL Cholesterol: 106 mg/dL — ABNORMAL HIGH (ref 0–99)
NonHDL: 140.35
Total CHOL/HDL Ratio: 3
Triglycerides: 173 mg/dL — ABNORMAL HIGH (ref 0.0–149.0)
VLDL: 34.6 mg/dL (ref 0.0–40.0)

## 2020-06-16 LAB — TSH: TSH: 4.29 u[IU]/mL (ref 0.35–4.50)

## 2020-06-16 LAB — PSA, MEDICARE: PSA: 0 ng/ml — ABNORMAL LOW (ref 0.10–4.00)

## 2020-06-16 LAB — HEMOGLOBIN A1C: Hgb A1c MFr Bld: 5.6 % (ref 4.6–6.5)

## 2020-06-16 NOTE — Assessment & Plan Note (Signed)
Followed by GI.  On prilosec.  No upper symptoms reported.

## 2020-06-16 NOTE — Progress Notes (Signed)
[name], male   DOB: 07-12-1935, 85 y.o.   MRN: [id number]   Subjective:    [name], male    DOB: [birth year], 85 y.o.   MRN: [id number]  HPI This visit occurred during the SARS-CoV-2 public health emergency.  Safety protocols were in place, including screening questions prior to the visit, additional usage of staff PPE, and extensive cleaning of exam room while observing appropriate contact time as indicated for disinfecting solutions.  Patient here for a scheduled follow up.  He reports he is doing relatively well.  Followed by pain clinic for his back.  Stable.  S/p injection knees (ortho).  Did not last long.  Still with knee pain.  Does not feel it limits his activity.  Does not feel needs any further intervention at this time.  No chest pain or sob reported.  Breathing stable.  No increased abdominal pain.  Bowels moving.  Taking blood pressure medication as directed.  States blood pressure has been doing well.  Did not bring in recorded readings, but has been checking.  Handling stress.    Past Medical History:  Diagnosis Date  . Acute postoperative pain 03/21/2018  . Anemia   . Arthritis   . Barrett esophagus   . Cancer ([place])    prostate,skin  . Chicken pox   . Diverticulitis   . Dysrhythmia   . GERD (gastroesophageal reflux disease)   . Hyperlipidemia   . Hypertension   . Hypothyroidism   . Melanoma ([place])    Malignant resection  . Sleep apnea   . Ulcer    Past Surgical History:  Procedure Laterality Date  . CARDIAC CATHETERIZATION    . Cataract Surgery Right 02/13/14  . COLON SURGERY  2006-2008-2011   polyps removed  . COLONOSCOPY W/ POLYPECTOMY    . COLONOSCOPY WITH PROPOFOL N/A 02/15/2018   Procedure: COLONOSCOPY WITH PROPOFOL;  Surgeon: Manya Silvas, MD;  Location: [facility] ENDOSCOPY;  Service: Endoscopy;  Laterality: N/A;  . COLONOSCOPY WITH PROPOFOL N/A 03/24/2020   Procedure: COLONOSCOPY WITH PROPOFOL;  Surgeon: Lesly Rubenstein,  MD;  Location: [facility] ENDOSCOPY;  Service: Endoscopy;  Laterality: N/A;  . cystocopy  2003  . ESOPHAGOGASTRODUODENOSCOPY (EGD) WITH PROPOFOL N/A 08/04/2016   Procedure: ESOPHAGOGASTRODUODENOSCOPY (EGD) WITH PROPOFOL;  Surgeon: Manya Silvas, MD;  Location: [facility] ENDOSCOPY;  Service: Endoscopy;  Laterality: N/A;  . ESOPHAGOGASTRODUODENOSCOPY (EGD) WITH PROPOFOL N/A 03/24/2020   Procedure: ESOPHAGOGASTRODUODENOSCOPY (EGD) WITH PROPOFOL;  Surgeon: Lesly Rubenstein, MD;  Location: [facility] ENDOSCOPY;  Service: Endoscopy;  Laterality: N/A;  . HEMORRHOID SURGERY    . PROSTATE SURGERY    . sleep study     Family History  Problem Relation Age of Onset  . Liver disease Mother   . Heart disease Father   . Kidney disease Father    Social History   Socioeconomic History  . Marital status: Married    Spouse name: Not on file  . Number of children: Not on file  . Years of education: Not on file  . Highest education level: Not on file  Occupational History  . Not on file  Tobacco Use  . Smoking status: Never Smoker  . Smokeless tobacco: Never Used  Vaping Use  . Vaping Use: Never used  Substance and Sexual Activity  . Alcohol use: No    Alcohol/week: 0.0 standard drinks  . Drug use: No  . Sexual activity: Never  Other Topics Concern  . Not on file  Social History Narrative  . Not on file   Social Determinants of Health   Financial Resource Strain: Low Risk   . Difficulty of Paying Living Expenses: Not hard at all  Food Insecurity: No Food Insecurity  . Worried About Charity fundraiser in the Last Year: Never true  . Ran Out of Food in the Last Year: Never true  Transportation Needs: No Transportation Needs  . Lack of Transportation (Medical): No  . Lack of Transportation (Non-Medical): No  Physical Activity:   . Days of Exercise per Week: Not on file  . Minutes of Exercise per Session: Not on file  Stress: No Stress Concern Present  . Feeling of Stress : Not at all  Social  Connections: Unknown  . Frequency of Communication with Friends and Family: Not on file  . Frequency of Social Gatherings with Friends and Family: Not on file  . Attends Religious Services: Not on file  . Active Member of Clubs or Organizations: Not on file  . Attends Archivist Meetings: Not on file  . Marital Status: Married    Outpatient Encounter Medications as of 06/16/2020  Medication Sig  . acetaminophen (TYLENOL) 500 MG tablet Take 500 mg by mouth every 6 (six) hours as needed.  Marland Kitchen allopurinol (ZYLOPRIM) 100 MG tablet Take 1 tablet (100 mg total) by mouth daily.  Marland Kitchen amLODipine (NORVASC) 10 MG tablet TAKE 1 TABLET BY MOUTH DAILY  . aspirin 81 MG tablet Take 81 mg by mouth daily.  . Calcium Carb-Cholecalciferol (CALCIUM PLUS D3 ABSORBABLE) [phone] MG-UNIT CAPS Take 1 capsule by mouth daily with breakfast.  . calcium carbonate (CALCIUM 600) 600 MG TABS tablet Take 2 tablets (1,200 mg total) by mouth daily with breakfast.  . fluticasone (FLONASE) 50 MCG/ACT nasal spray TWO PUFFS IN EACH NOSTRIL ONCE A DAY  . hydrALAZINE (APRESOLINE) 25 MG tablet TAKE ONE TABLET BY MOUTH THREE TIMES A DAY  . hyoscyamine (LEVSIN, ANASPAZ) 0.125 MG tablet Take 0.125 mg by mouth as needed.  . irbesartan (AVAPRO) 300 MG tablet TAKE 1 TABLET BY MOUTH DAILY  . L-Lysine 500 MG TABS Take by mouth daily.  Marland Kitchen levothyroxine (SYNTHROID) 100 MCG tablet TAKE 1 TABLET BY MOUTH DAILY WITH BREAKFAST  . omeprazole (PRILOSEC) 20 MG capsule TAKE ONE CAPSULE BY MOUTH TWICE A DAY  . Sennosides (LAX-PILLS PO) Take 2 mg by mouth.  . traMADol (ULTRAM) 50 MG tablet Take 1 tablet (50 mg total) by mouth every 8 (eight) hours as needed for severe pain.  . valACYclovir (VALTREX) 1000 MG tablet TAKE 1 TABLET BY MOUTH DAILY  . [DISCONTINUED] rosuvastatin (CRESTOR) 5 MG tablet TAKE 1 TABLET BY MOUTH DAILY   No facility-administered encounter medications on file as of 06/16/2020.    Review of Systems  Constitutional:  Negative for appetite change and unexpected weight change.  HENT: Negative for congestion and sinus pressure.   Respiratory: Negative for cough, chest tightness and shortness of breath.   Cardiovascular: Negative for chest pain, palpitations and leg swelling.  Gastrointestinal: Negative for abdominal pain, diarrhea, nausea and vomiting.  Genitourinary: Negative for difficulty urinating and dysuria.  Musculoskeletal: Negative for myalgias.       Back and knee pain as outlined.    Skin: Negative for color change and rash.  Neurological: Negative for dizziness, light-headedness and headaches.  Psychiatric/Behavioral: Negative for agitation and dysphoric mood.       Objective:    Physical Exam Vitals reviewed.  Constitutional:  General: He is not in acute distress.    Appearance: Normal appearance. He is well-developed.  HENT:     Head: Normocephalic and atraumatic.     Right Ear: External ear normal.     Left Ear: External ear normal.  Eyes:     General: No scleral icterus.       Right eye: No discharge.        Left eye: No discharge.     Conjunctiva/sclera: Conjunctivae normal.  Cardiovascular:     Rate and Rhythm: Normal rate.     Comments: Appears to be regular with premature beat.  Pulmonary:     Effort: Pulmonary effort is normal. No respiratory distress.     Breath sounds: Normal breath sounds.  Abdominal:     General: Bowel sounds are normal.     Palpations: Abdomen is soft.     Tenderness: There is no abdominal tenderness.  Musculoskeletal:        General: No swelling or tenderness.  Skin:    Findings: No erythema or rash.  Neurological:     Mental Status: He is alert.  Psychiatric:        Mood and Affect: Mood normal.        Behavior: Behavior normal.     BP 128/76   Pulse 70   Temp 97.6 F (36.4 C) (Oral)   Resp 16   Ht 5\' 8"  (1.727 m)   Wt 250 lb (113.4 kg)   SpO2 98%   BMI 38.01 kg/m  Wt Readings from Last 3 Encounters:  06/16/20 250 lb  (113.4 kg)  05/29/20 250 lb (113.4 kg)  05/07/20 250 lb (113.4 kg)     Lab Results  Component Value Date   WBC 9.0 06/16/2020   HGB 15.4 06/16/2020   HCT 45.9 06/16/2020   PLT 243.0 06/16/2020   GLUCOSE 88 06/16/2020   CHOL 199 06/16/2020   TRIG 173.0 (H) 06/16/2020   HDL 58.30 06/16/2020   LDLDIRECT 101.0 08/03/2018   LDLCALC 106 (H) 06/16/2020   ALT 29 06/16/2020   AST 24 06/16/2020   NA 141 06/16/2020   K 4.5 06/16/2020   CL 102 06/16/2020   CREATININE 1.28 06/16/2020   BUN 17 06/16/2020   CO2 30 06/16/2020   TSH 4.29 06/16/2020   PSA 0.00 (L) 06/16/2020   HGBA1C 5.6 06/16/2020       Assessment & Plan:   Problem List Items Addressed This Visit    Vitamin D deficiency    Follow vitamin d level.       Osteoarthritis of lumbar spine (Chronic)    Has been followed by pain clinic.  Stable.       Knee pain    S/p injection.  Did not last long.  Desires no further intervention at this time. Follow.        Irregular heartbeat - Primary    EKG today - SR with PACs.  Currently asymptomatic.  Follow.       Relevant Orders   EKG 12-Lead (Completed)   Hypothyroid    On thyroid replacement.  Follow tsh.       Hypertension    Blood pressure doing better on current regimen.  Currently on hydralazine, avapro and amolodipine.  Follow pressures.  Follow metabolic panel.       Hyperglycemia    Low carb diet and exercise.  Follow met b and a1c.       Hypercholesterolemia    Off cholesterol medication secondary  to intolerance.  Will recheck lipid panel today.  Discussed with him regarding another trial of statin medication and just try once or twice a week to see if tolerates.  Low cholesterol diet and exercise.        History of prostate cancer    S/p prostatectomy.  Follow psa.       Relevant Orders   PSA, Medicare (Completed)   Barrett's esophagus    Followed by GI.  On prilosec.  No upper symptoms reported.        B12 deficiency    Follow B12 level.           Other Visit Diagnoses    Essential hypertension           Einar Pheasant, MD

## 2020-06-16 NOTE — Assessment & Plan Note (Signed)
S/p prostatectomy.  Follow psa.  

## 2020-06-16 NOTE — Progress Notes (Signed)
Order placed for f/u liver panel.  

## 2020-06-22 ENCOUNTER — Encounter: Payer: Self-pay | Admitting: Internal Medicine

## 2020-06-22 NOTE — Assessment & Plan Note (Signed)
Off cholesterol medication secondary to intolerance.  Will recheck lipid panel today.  Discussed with him regarding another trial of statin medication and just try once or twice a week to see if tolerates.  Low cholesterol diet and exercise.

## 2020-06-22 NOTE — Assessment & Plan Note (Signed)
Low carb diet and exercise.  Follow met b and a1c.  

## 2020-06-22 NOTE — Assessment & Plan Note (Signed)
Blood pressure doing better on current regimen.  Currently on hydralazine, avapro and amolodipine.  Follow pressures.  Follow metabolic panel.

## 2020-06-22 NOTE — Assessment & Plan Note (Signed)
Follow vitamin d level.   

## 2020-06-22 NOTE — Assessment & Plan Note (Signed)
EKG today - SR with PACs.  Currently asymptomatic.  Follow.

## 2020-06-22 NOTE — Assessment & Plan Note (Signed)
Follow B12 level.  

## 2020-06-22 NOTE — Assessment & Plan Note (Signed)
S/p injection.  Did not last long.  Desires no further intervention at this time. Follow.

## 2020-06-22 NOTE — Assessment & Plan Note (Signed)
Has been followed by pain clinic.  Stable.

## 2020-06-22 NOTE — Assessment & Plan Note (Signed)
On thyroid replacement.  Follow tsh.  

## 2020-08-01 ENCOUNTER — Other Ambulatory Visit: Payer: Self-pay | Admitting: Internal Medicine

## 2020-08-03 NOTE — Progress Notes (Signed)
PROVIDER NOTE: Information contained herein reflects review and annotations entered in association with encounter. Interpretation of such information and data should be left to medically-trained personnel. Information provided to patient can be located elsewhere in the medical record under "Patient Instructions". Document created using STT-dictation technology, any transcriptional errors that may result from process are unintentional.    Patient: Hector [name]  Service Category: E/M  Provider: Gaspar Cola, MD  DOB: 11/12/1934  DOS: 08/04/2020  Specialty: Interventional Pain Management  MRN: [id number]  Setting: Ambulatory outpatient  PCP: Einar Pheasant, MD  Type: Established Patient    Referring Provider: Einar Pheasant, MD  Location: Office  Delivery: Face-to-face     HPI  [name], a 86 y.o. 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 Hector [name] primary complain today is Back Pain (Lower back) Last encounter: My last encounter with him was on 05/07/2020. Pertinent problems: Hector [name] has History of prostate cancer; Malignant melanoma ([place]); Shoulder pain, left; Chronic hip pain (Left); Chronic low back pain (Primary Area of Pain) (Bilateral) (L>R); Chronic pain syndrome; Polyarthritis, unspecified (Secondary Area of Pain); Prostate cancer ([place]); L5-S1 bilateral pars defect with spondylolisthesis; Lumbosacral Grade 1 Anterolisthesis of L5 over S1; Lumbar facet arthropathy (Bilateral); Lumbar foraminal stenosis (L5-S1) (Bilateral) (L>R); Chronic Lumbar radiculitis (L5) (Left); Chronic musculoskeletal pain; Lumbar facet syndrome (Bilateral) (L>R); Chronic lower extremity pain (Secondary Area of Pain) (Left); Chronic lower extremity radicular pain (L5) (Left); Lumbar facet osteoarthritis (Bilateral); Osteoarthritis of lumbar spine; Lumbar spondylosis; Spondylosis without myelopathy or radiculopathy, lumbosacral region; Chronic right shoulder pain; and DDD  (degenerative disc disease), lumbosacral on their pertinent problem list. Pain Assessment: Severity of Chronic pain is reported as a 5 /10. Location: Back Lower,Right,Left/Denies. Onset: More than a month ago. Quality: Burning,Aching. Timing: Intermittent. Modifying factor(s): sitting down and meds and procedure. Vitals:  height is '5\' 8"'  (1.727 m) and weight is 250 lb (113.4 kg). His temperature is 97.6 F (36.4 C). His blood pressure is 138/79 and his pulse is 50 (abnormal). His oxygen saturation is 98%.   Reason for encounter: medication management.   The patient indicates doing well with the current medication regimen. No adverse reactions or side effects reported to the medications. PMP & UDS compliant.  Unfortunately, the patient is having an increase in his right lower back pain and midline pain.  He denies any lower extremity pain.  In the past we have done lumbar facet blocks and radiofrequency for him.  However, I made myself a note about the radiofrequency that we needed to hold on any further attempts at that radiofrequency until he can bring his BMI down to 30.  This would mean a weight of 200 pounds for this patient.  He refers that he is currently 250 pounds.  On the last time that we provided him with radiofrequency (2019) he did get excellent benefit but it was very difficult to do the procedure due to the patient's noncompliance movement.  Today I have informed the patient that unless he brings his BMI down closer to 30, we may not be able to repeat the radiofrequency due to overexposure of the healthcare staff to unnecessary additional radiation.  He understood and accepted.  Note: Today I had to send his prescription to the pharmacy 3 different times due to failure to transmit.  Pharmacotherapy Assessment   Analgesic: Tramadol 50 mg, 1 tab PO q 8 hrs (150 mg/day of tramadol) MME/day:104m/day.   Monitoring: [place] PMP:  PDMP reviewed during this encounter.       Pharmacotherapy: No  side-effects or adverse reactions reported. Compliance: No problems identified. Effectiveness: Clinically acceptable.  Chauncey Fischer, RN  08/04/2020  9:02 AM  Sign when Signing Visit Nursing Pain Medication Assessment:  Safety precautions to be maintained throughout the outpatient stay will include: orient to surroundings, keep bed in low position, maintain call bell within reach at all times, provide assistance with transfer out of bed and ambulation.  Medication Inspection Compliance: Pill count conducted under aseptic conditions, in front of the patient. Neither the pills nor the bottle was removed from the patient's sight at any time. Once count was completed pills were immediately returned to the patient in their original bottle.  Medication: Tramadol (Ultram) Pill/Patch Count: 35 of 90 pills remain Pill/Patch Appearance: Markings consistent with prescribed medication Bottle Appearance: Standard pharmacy container. Clearly labeled. Filled Date: 50 / 3 / 21 Last Medication intake:  TodaySafety precautions to be maintained throughout the outpatient stay will include: orient to surroundings, keep bed in low position, maintain call bell within reach at all times, provide assistance with transfer out of bed and ambulation.     UDS:  Summary  Date Value Ref Range Status  05/07/2020 Note  Final    Comment:    ==================================================================== [employer] [facility] 13 (MW) ==================================================================== Test                             Result       Flag       Units  Drug Present and Declared for Prescription Verification   Tramadol                       >1825        EXPECTED   ng/mg creat   O-Desmethyltramadol            1023         EXPECTED   ng/mg creat   N-Desmethyltramadol            >1825        EXPECTED   ng/mg creat    Source of tramadol is a prescription medication. O-desmethyltramadol    and N-desmethyltramadol  are expected metabolites of tramadol.  ==================================================================== Test                      Result    Flag   Units      Ref Range   Creatinine              274              mg/dL      >=20 ==================================================================== Declared Medications:  The flagging and interpretation on this report are based on the  following declared medications.  Unexpected results may arise from  inaccuracies in the declared medications.   **Note: The testing scope of this panel includes these medications:   Tramadol (Ultram)   **Note: The testing scope of this panel does not include the  following reported medications:   Acetaminophen (Tylenol)  Allopurinol (Zyloprim)  Amlodipine (Norvasc)  Aspirin  Calcium  Cholecalciferol  Fluticasone (Flonase)  Hydralazine (Apresoline)  Hyoscyamine  Irbesartan (Avapro)  Levothyroxine (Synthroid)  Lysine  Omeprazole (Prilosec)  Rosuvastatin (Crestor)  Sennosides  Valacyclovir (Valtrex) ==================================================================== For clinical consultation, please call [phone]. ====================================================================      ROS  Constitutional: Denies any fever  or chills Gastrointestinal: No reported hemesis, hematochezia, vomiting, or acute GI distress Musculoskeletal: Denies any acute onset joint swelling, redness, loss of ROM, or weakness Neurological: No reported episodes of acute onset apraxia, aphasia, dysarthria, agnosia, amnesia, paralysis, loss of coordination, or loss of consciousness  Medication Review  Calcium Plus D3 Absorbable, L-Lysine, Sennosides, acetaminophen, allopurinol, amLODipine, aspirin, calcium carbonate, fluticasone, hydrALAZINE, hyoscyamine, irbesartan, levothyroxine, omeprazole, traMADol, and valACYclovir  History Review  Allergy: Hector [name] is allergic to tape. Drug: Hector [name]  reports no  history of drug use. Alcohol:  reports no history of alcohol use. Tobacco:  reports that he has never smoked. He has never used smokeless tobacco. Social: Hector [name]  reports that he has never smoked. He has never used smokeless tobacco. He reports that he does not drink alcohol and does not use drugs. Medical:  has a past medical history of Acute postoperative pain (03/21/2018), Anemia, Arthritis, Barrett esophagus, Cancer ([place]), Chicken pox, Diverticulitis, Dysrhythmia, GERD (gastroesophageal reflux disease), Hyperlipidemia, Hypertension, Hypothyroidism, Melanoma ([place]), Sleep apnea, and Ulcer. Surgical: Hector [name]  has a past surgical history that includes Prostate surgery; cystocopy (2003); Hemorrhoid surgery; Cardiac catheterization; sleep study; Colon surgery (2006-2008-2011); Cataract Surgery (Right, 02/13/14); Esophagogastroduodenoscopy (egd) with propofol (N/A, 08/04/2016); Colonoscopy w/ polypectomy; Colonoscopy with propofol (N/A, 02/15/2018); Colonoscopy with propofol (N/A, 03/24/2020); and Esophagogastroduodenoscopy (egd) with propofol (N/A, 03/24/2020). Family: family history includes Heart disease in his father; Kidney disease in his father; Liver disease in his mother.  Laboratory Chemistry Profile   Renal Lab Results  Component Value Date   BUN 17 06/16/2020   CREATININE 1.28 06/16/2020   BCR 10 06/13/2017   GFR 51.93 (L) 06/16/2020   GFRAA >60 01/23/2018   GFRNONAA 56 (L) 01/23/2018     Hepatic Lab Results  Component Value Date   AST 24 06/16/2020   ALT 29 06/16/2020   ALBUMIN 4.3 06/16/2020   ALKPHOS 68 06/16/2020     Electrolytes Lab Results  Component Value Date   NA 141 06/16/2020   K 4.5 06/16/2020   CL 102 06/16/2020   CALCIUM 9.6 06/16/2020   MG 2.0 06/13/2017     Bone Lab Results  Component Value Date   VD25OH 21.46 (L) 03/28/2018   25OHVITD1 20 (L) 06/13/2017   25OHVITD2 <1.0 06/13/2017   25OHVITD3 20 06/13/2017     Inflammation (CRP: Acute Phase) (ESR:  Chronic Phase) Lab Results  Component Value Date   CRP 2.8 06/13/2017   ESRSEDRATE 13 06/13/2017       Note: Above Lab results reviewed.  Recent Imaging Review  DG Knee 1-2 Views Right CLINICAL DATA:  Persistent increased knee pain affecting walking.  EXAM: RIGHT KNEE - 1-2 VIEW  COMPARISON:  None.  FINDINGS: No fracture. Mild medial compartment joint space narrowing. Trace patellofemoral spurs and minimal spurring of the tibial spines. Trace joint effusion. No erosion, periosteal reaction, or evidence of focal bone lesion. There are vascular calcifications.  IMPRESSION: Minimal osteoarthritis, most prominent in the medial compartment. Trace joint effusion.  Electronically Signed   By: Keith Rake M.D.   On: 02/29/2020 20:12 DG Knee 1-2 Views Left CLINICAL DATA:  Persistent increased knee pain affecting walking.  EXAM: LEFT KNEE - 1-2 VIEW  COMPARISON:  None.  FINDINGS: No fracture. Mild medial tibiofemoral joint space narrowing. No significant osteophytes. There is a small knee joint effusion. No erosion, periosteal reaction, or evidence of focal bone lesion. Mild generalized soft tissue edema. There are vascular calcifications.  IMPRESSION: Mild medial tibiofemoral joint  space narrowing. Small knee joint effusion.  Electronically Signed   By: Keith Rake M.D.   On: 02/29/2020 20:12 Note: Reviewed        Physical Exam  General appearance: Well nourished, well developed, and well hydrated. In no apparent acute distress Mental status: Alert, oriented x 3 (person, place, & time)       Respiratory: No evidence of acute respiratory distress Eyes: PERLA Vitals: BP 138/79   Pulse (!) 50   Temp 97.6 F (36.4 C)   Ht '5\' 8"'  (1.727 m)   Wt 250 lb (113.4 kg)   SpO2 98%   BMI 38.01 kg/m  BMI: Estimated body mass index is 38.01 kg/m as calculated from the following:   Height as of this encounter: '5\' 8"'  (1.727 m).   Weight as of this encounter: 250  lb (113.4 kg). Ideal: Ideal body weight: 68.4 kg (150 lb 12.7 oz) Adjusted ideal body weight: 86.4 kg (190 lb 7.6 oz)  Assessment   Status Diagnosis  Recurring Worsening Resolved 1. Lumbar facet syndrome (Bilateral) (L>R)   2. Chronic low back pain (Primary Area of Pain) (Bilateral) (L>R)   3. Chronic lower extremity pain (Secondary Area of Pain) (Left)   4. Chronic pain syndrome   5. L5-S1 bilateral pars defect with spondylolisthesis   6. Lumbosacral Grade 1 Anterolisthesis of L5 over S1   7. Lumbar foraminal stenosis (L5-S1) (Bilateral) (L>R)   8. Pharmacologic therapy   9. Severe obesity (BMI 35.0-39.9) with comorbidity ([place])      Updated Problems: Problem  Malignant Melanoma ([facility])   Overview:  Last Assessment & Plan:  Followed by dermatology.    Formatting of this note might be different from the original. Last Assessment & Plan:  Followed by dermatology.   Prostate Cancer ([facility])   Overview:  Last Assessment & Plan:  History of prostate cancer s/p prostatectomy.  Follow psa.    Formatting of this note might be different from the original. Last Assessment & Plan:  History of prostate cancer s/p prostatectomy.  Follow psa.   Severe Obesity (Bmi 35.0-39.9) With Comorbidity ([facility])  Sleep Apnea   Overview:  Last Assessment & Plan:  Unable to tolerate the mask.  Declines a follow trial.  Avoid sleeping supine.  Avoid sedating medication.    Formatting of this note might be different from the original. Last Assessment & Plan:  Unable to tolerate the mask.  Declines a follow trial.  Avoid sleeping supine.  Avoid sedating medication.   Barrett's Esophagus   EGD 11/28/12.  Due f/u EGD 11/2015  Overview:  Overview:  EGD 11/28/12.  Due f/u EGD 11/2015  Last Assessment & Plan:  Followed by GI.  Due EGD.  Discussed with him today.  Refer to GI.   Formatting of this note might be different from the original. Overview:  EGD 11/28/12.  Due f/u EGD 11/2015  Last Assessment &  Plan:  Followed by GI.  Due EGD.  Discussed with him today.  Refer to GI.   History of Colonic Polyps   Colonoscopy 11/28/12 - tubular adenomas.  Repeat colonoscopy 10/2017  Colonoscopy 01/2018 - many small polyps in the sigmoid colon, in the descending colon, in the transverse colon and in the ascending colon.  Pathology:  multiple polyps (tubular adenomas) Colonoscopy 03/24/20 - multiple polyps (tubular adenomas, tubulovillous adenomas).   Formatting of this note might be different from the original. Overview:  Colonoscopy 11/28/12 - tubular adenomas.  Repeat colonoscopy 10/2017  Last Assessment &  Plan:  Colonoscopy 11/28/12 as outlined.  Repeat colonoscopy /2019.   Hypertension   Overview:  Last Assessment & Plan:  Blood pressure as outlined.  Same medication regimen.  Follow pressures.    Formatting of this note might be different from the original. Last Assessment & Plan:  Blood pressure as outlined.  Same medication regimen.  Follow pressures.   Hypercholesterolemia   Overview:  Last Assessment & Plan:  Low cholesterol diet and exercise.  Follow lipid panel.    Formatting of this note might be different from the original. Last Assessment & Plan:  Low cholesterol diet and exercise.  Follow lipid panel.     Plan of Care  Problem-specific:  No problem-specific Assessment & Plan notes found for this encounter.  Hector [name] has a current medication list which includes the following long-term medication(s): allopurinol, amlodipine, calcium carbonate, fluticasone, hydralazine, irbesartan, levothyroxine, omeprazole, calcium plus d3 absorbable, hyoscyamine, and [START ON 08/08/2020] tramadol.  Pharmacotherapy (Medications Ordered): Meds ordered this encounter  Medications  . DISCONTD: traMADol (ULTRAM) 50 MG tablet    Sig: Take 1 tablet (50 mg total) by mouth every 8 (eight) hours as needed for severe pain.    Dispense:  270 tablet    Refill:  0    Chronic Pain: STOP Act  (Not applicable) Fill 1 day early if closed on refill date. Avoid benzodiazepines within 8 hours of opioids  . DISCONTD: traMADol (ULTRAM) 50 MG tablet    Sig: Take 1 tablet (50 mg total) by mouth every 8 (eight) hours as needed for severe pain.    Dispense:  270 tablet    Refill:  0    Chronic Pain: STOP Act (Not applicable) Fill 1 day early if closed on refill date. Avoid benzodiazepines within 8 hours of opioids  . traMADol (ULTRAM) 50 MG tablet    Sig: Take 1 tablet (50 mg total) by mouth every 8 (eight) hours as needed for severe pain.    Dispense:  270 tablet    Refill:  0    Chronic Pain: STOP Act (Not applicable) Fill 1 day early if closed on refill date. Avoid benzodiazepines within 8 hours of opioids   Orders:  Orders Placed This Encounter  Procedures  . LUMBAR FACET(MEDIAL BRANCH NERVE BLOCK) MBNB    Standing Status:   Future    Standing Expiration Date:   09/04/2020    Scheduling Instructions:     Procedure: Lumbar facet block (AKA.: Lumbosacral medial branch nerve block)     Side: Right-sided     Level: L3-4, L4-5, & L5-S1 Facets (L2, L3, L4, L5, & S1 Medial Branch Nerves)     Sedation: With Sedation.     Timeframe: ASAA    Order Specific Question:   Where will this procedure be performed?    Answer:   [facility]   Follow-up plan:   Return for Procedure (w/ sedation): (R) L-FCT BLK.      Interventional management options: Planned, scheduled, and/or pending:      Considering:   Diagnostic left L5 TFESI  Possible left L5 nerve root ganglion RFA Diagnosticleft L4-5 LESI   Palliative PRN treatment(s):   Diagnostic midline caudal ESI #3  Palliativebilateral lumbar facet block #3 Palliative right lumbar facet RFA #2 (last done 04/18/2018) (difficult due to patient moving and anatomy.)  Palliative left lumbar facet RFA #2 (last done 03/21/2018) (difficult due to patient moving and anatomy.)     Recent Visits Date Type Provider  Dept  05/07/20 Office  Visit Milinda Pointer, MD [facility]  Showing recent visits within past 90 days and meeting all other requirements Today's Visits Date Type Provider Dept  08/04/20 Office Visit Milinda Pointer, MD [facility]  Showing today's visits and meeting all other requirements Future Appointments Date Type Provider Dept  08/07/20 Appointment Milinda Pointer, MD [facility]  Showing future appointments within next 90 days and meeting all other requirements  I discussed the assessment and treatment plan with the patient. The patient was provided an opportunity to ask questions and all were answered. The patient agreed with the plan and demonstrated an understanding of the instructions.  Patient advised to call back or seek an in-person evaluation if the symptoms or condition worsens.  Duration of encounter: 30 minutes.  Note by: Gaspar Cola, MD Date: 08/04/2020; Time: 12:16 PM

## 2020-08-04 ENCOUNTER — Encounter: Payer: Self-pay | Admitting: Pain Medicine

## 2020-08-04 ENCOUNTER — Ambulatory Visit: Payer: Medicare Other | Attending: Pain Medicine | Admitting: Pain Medicine

## 2020-08-04 ENCOUNTER — Other Ambulatory Visit: Payer: Self-pay

## 2020-08-04 VITALS — BP 138/79 | HR 50 | Temp 97.6°F | Ht 68.0 in | Wt 250.0 lb

## 2020-08-04 DIAGNOSIS — M79605 Pain in left leg: Secondary | ICD-10-CM | POA: Diagnosis not present

## 2020-08-04 DIAGNOSIS — M4310 Spondylolisthesis, site unspecified: Secondary | ICD-10-CM | POA: Diagnosis not present

## 2020-08-04 DIAGNOSIS — M47816 Spondylosis without myelopathy or radiculopathy, lumbar region: Secondary | ICD-10-CM | POA: Diagnosis not present

## 2020-08-04 DIAGNOSIS — G894 Chronic pain syndrome: Secondary | ICD-10-CM | POA: Diagnosis not present

## 2020-08-04 DIAGNOSIS — M4300 Spondylolysis, site unspecified: Secondary | ICD-10-CM | POA: Diagnosis not present

## 2020-08-04 DIAGNOSIS — M5442 Lumbago with sciatica, left side: Secondary | ICD-10-CM | POA: Insufficient documentation

## 2020-08-04 DIAGNOSIS — M48061 Spinal stenosis, lumbar region without neurogenic claudication: Secondary | ICD-10-CM | POA: Diagnosis not present

## 2020-08-04 DIAGNOSIS — Z79899 Other long term (current) drug therapy: Secondary | ICD-10-CM

## 2020-08-04 DIAGNOSIS — G8929 Other chronic pain: Secondary | ICD-10-CM

## 2020-08-04 MED ORDER — TRAMADOL HCL 50 MG PO TABS: 50.0000 mg | ORAL_TABLET | Freq: Three times a day (TID) | ORAL | 0 refills | Status: DC | PRN

## 2020-08-04 NOTE — Progress Notes (Signed)
Nursing Pain Medication Assessment:  Safety precautions to be maintained throughout the outpatient stay will include: orient to surroundings, keep bed in low position, maintain call bell within reach at all times, provide assistance with transfer out of bed and ambulation.  Medication Inspection Compliance: Pill count conducted under aseptic conditions, in front of the patient. Neither the pills nor the bottle was removed from the patient's sight at any time. Once count was completed pills were immediately returned to the patient in their original bottle.  Medication: Tramadol (Ultram) Pill/Patch Count: 35 of 90 pills remain Pill/Patch Appearance: Markings consistent with prescribed medication Bottle Appearance: Standard pharmacy container. Clearly labeled. Filled Date: 92 / 3 / 21 Last Medication intake:  TodaySafety precautions to be maintained throughout the outpatient stay will include: orient to surroundings, keep bed in low position, maintain call bell within reach at all times, provide assistance with transfer out of bed and ambulation.

## 2020-08-04 NOTE — Patient Instructions (Signed)
____________________________________________________________________________________________  Preparing for Procedure with Sedation  Procedure appointments are limited to planned procedures: . No Prescription Refills. . No disability issues will be discussed. . No medication changes will be discussed.  Instructions: . Oral Intake: Do not eat or drink anything for at least 8 hours prior to your procedure. (Exception: Blood Pressure Medication. See below.) . Transportation: Unless otherwise stated by your physician, you may drive yourself after the procedure. . Blood Pressure Medicine: Do not forget to take your blood pressure medicine with a sip of water the morning of the procedure. If your Diastolic (lower reading)is above 100 mmHg, elective cases will be cancelled/rescheduled. . Blood thinners: These will need to be stopped for procedures. Notify our staff if you are taking any blood thinners. Depending on which one you take, there will be specific instructions on how and when to stop it. . Diabetics on insulin: Notify the staff so that you can be scheduled 1st case in the morning. If your diabetes requires high dose insulin, take only  of your normal insulin dose the morning of the procedure and notify the staff that you have done so. . Preventing infections: Shower with an antibacterial soap the morning of your procedure. . Build-up your immune system: Take 1000 mg of Vitamin C with every meal (3 times a day) the day prior to your procedure. . Antibiotics: Inform the staff if you have a condition or reason that requires you to take antibiotics before dental procedures. . Pregnancy: If you are pregnant, call and cancel the procedure. . Sickness: If you have a cold, fever, or any active infections, call and cancel the procedure. . Arrival: You must be in the facility at least 30 minutes prior to your scheduled procedure. . Children: Do not bring children with you. . Dress appropriately:  Bring dark clothing that you would not mind if they get stained. . Valuables: Do not bring any jewelry or valuables.  Reasons to call and reschedule or cancel your procedure: (Following these recommendations will minimize the risk of a serious complication.) . Surgeries: Avoid having procedures within 2 weeks of any surgery. (Avoid for 2 weeks before or after any surgery). . Flu Shots: Avoid having procedures within 2 weeks of a flu shots or . (Avoid for 2 weeks before or after immunizations). . Barium: Avoid having a procedure within 7-10 days after having had a radiological study involving the use of radiological contrast. (Myelograms, Barium swallow or enema study). . Heart attacks: Avoid any elective procedures or surgeries for the initial 6 months after a "Myocardial Infarction" (Heart Attack). . Blood thinners: It is imperative that you stop these medications before procedures. Let us know if you if you take any blood thinner.  . Infection: Avoid procedures during or within two weeks of an infection (including chest colds or gastrointestinal problems). Symptoms associated with infections include: Localized redness, fever, chills, night sweats or profuse sweating, burning sensation when voiding, cough, congestion, stuffiness, runny nose, sore throat, diarrhea, nausea, vomiting, cold or Flu symptoms, recent or current infections. It is specially important if the infection is over the area that we intend to treat. . Heart and lung problems: Symptoms that may suggest an active cardiopulmonary problem include: cough, chest pain, breathing difficulties or shortness of breath, dizziness, ankle swelling, uncontrolled high or unusually low blood pressure, and/or palpitations. If you are experiencing any of these symptoms, cancel your procedure and contact your primary care physician for an evaluation.  Remember:  Regular Business hours are:    Monday to Thursday 8:00 AM to 4:00 PM  Provider's  Schedule: Milinda Pointer, MD:  Procedure days: Tuesday and Thursday 7:30 AM to 4:00 PM  Gillis Santa, MD:  Procedure days: Monday and Wednesday 7:30 AM to 4:00 PM ____________________________________________________________________________________________   ____________________________________________________________________________________________  General Risks and Possible Complications  Patient Responsibilities: It is important that you read this as it is part of your informed consent. It is our duty to inform you of the risks and possible complications associated with treatments offered to you. It is your responsibility as a patient to read this and to ask questions about anything that is not clear or that you believe was not covered in this document.  Patient's Rights: You have the right to refuse treatment. You also have the right to change your mind, even after initially having agreed to have the treatment done. However, under this last option, if you wait until the last second to change your mind, you may be charged for the materials used up to that point.  Introduction: Medicine is not an Chief Strategy Officer. Everything in Medicine, including the lack of treatment(s), carries the potential for danger, harm, or loss (which is by definition: Risk). In Medicine, a complication is a secondary problem, condition, or disease that can aggravate an already existing one. All treatments carry the risk of possible complications. The fact that a side effects or complications occurs, does not imply that the treatment was conducted incorrectly. It must be clearly understood that these can happen even when everything is done following the highest safety standards.  No treatment: You can choose not to proceed with the proposed treatment alternative. The "PRO(s)" would include: avoiding the risk of complications associated with the therapy. The "CON(s)" would include: not getting any of the treatment  benefits. These benefits fall under one of three categories: diagnostic; therapeutic; and/or palliative. Diagnostic benefits include: getting information which can ultimately lead to improvement of the disease or symptom(s). Therapeutic benefits are those associated with the successful treatment of the disease. Finally, palliative benefits are those related to the decrease of the primary symptoms, without necessarily curing the condition (example: decreasing the pain from a flare-up of a chronic condition, such as incurable terminal cancer).  General Risks and Complications: These are associated to most interventional treatments. They can occur alone, or in combination. They fall under one of the following six (6) categories: no benefit or worsening of symptoms; bleeding; infection; nerve damage; allergic reactions; and/or death. 1. No benefits or worsening of symptoms: In Medicine there are no guarantees, only probabilities. No healthcare provider can ever guarantee that a medical treatment will work, they can only state the probability that it may. Furthermore, there is always the possibility that the condition may worsen, either directly, or indirectly, as a consequence of the treatment. 2. Bleeding: This is more common if the patient is taking a blood thinner, either prescription or over the counter (example: Goody Powders, Fish oil, Aspirin, Garlic, etc.), or if suffering a condition associated with impaired coagulation (example: Hemophilia, cirrhosis of the liver, low platelet counts, etc.). However, even if you do not have one on these, it can still happen. If you have any of these conditions, or take one of these drugs, make sure to notify your treating physician. 3. Infection: This is more common in patients with a compromised immune system, either due to disease (example: diabetes, cancer, human immunodeficiency virus [HIV], etc.), or due to medications or treatments (example: therapies used to treat  cancer and  rheumatological diseases). However, even if you do not have one on these, it can still happen. If you have any of these conditions, or take one of these drugs, make sure to notify your treating physician. 4. Nerve Damage: This is more common when the treatment is an invasive one, but it can also happen with the use of medications, such as those used in the treatment of cancer. The damage can occur to small secondary nerves, or to large primary ones, such as those in the spinal cord and brain. This damage may be temporary or permanent and it may lead to impairments that can range from temporary numbness to permanent paralysis and/or brain death. 5. Allergic Reactions: Any time a substance or material comes in contact with our body, there is the possibility of an allergic reaction. These can range from a mild skin rash (contact dermatitis) to a severe systemic reaction (anaphylactic reaction), which can result in death. 6. Death: In general, any medical intervention can result in death, most of the time due to an unforeseen complication. ____________________________________________________________________________________________   ______________________________________________________________________________________________  Body mass index (BMI)  Body mass index (BMI) is a common tool for deciding whether a person has an appropriate body weight.  It measures a persons weight in relation to their height.   According to the [employer] of health (NIH): Marland Kitchen A BMI of less than 18.5 means that a person is underweight. . A BMI of between 18.5 and 24.9 is ideal. . A BMI of between 25 and 29.9 is overweight. . A BMI over 30 indicates obesity.  Weight Management Required  URGENT: Your weight has been found to be adversely affecting your health.  Dear Mr. Hassey:  Your current Estimated body mass index is 38.01 kg/m as calculated from the following:   Height as of this encounter: $RemoveBeforeD'5\' [id number]"'[id number]$   (1.727 m).   Weight as of this encounter: 250 lb (113.4 kg).  Please use the table below to identify your weight category and associated incidence of chronic pain, secondary to your weight.  Body Mass Index (BMI) Classification BMI level (kg/m2) Category Associated incidence of chronic pain  <18  Underweight   18.5-24.9 Ideal body weight   25-29.9 Overweight  20%  30-34.9 Obese (Class I)  68%  35-39.9 Severe obesity (Class II)  136%  >40 Extreme obesity (Class III)  254%   In addition: You will be considered "Morbidly Obese", if your BMI is above 30 and you have one or more of the following conditions which are known to be caused and/or directly associated with obesity: 1.    Type 2 Diabetes (Which in turn can lead to cardiovascular diseases (CVD), stroke, peripheral vascular diseases (PVD), retinopathy, nephropathy, and neuropathy) 2.    Cardiovascular Disease (High Blood Pressure; Congestive Heart Failure; High Cholesterol; Coronary Artery Disease; Angina; or History of Heart Attacks) 3.    Breathing problems (Asthma; obesity-hypoventilation syndrome; obstructive sleep apnea; chronic inflammatory airway disease; reactive airway disease; or shortness of breath) 4.    Chronic kidney disease 5.    Liver disease (nonalcoholic fatty liver disease) 6.    High blood pressure 7.    Acid reflux (gastroesophageal reflux disease; heartburn) 8.    Osteoarthritis (OA) (with any of the following: hip pain; knee pain; and/or low back pain) 9.    Low back pain (Lumbar Facet Syndrome; and/or Degenerative Disc Disease) 10.  Hip pain (Osteoarthritis of hip) (For every 1 lbs of added body weight, there is a 2 lbs increase  in pressure inside of each hip articulation. 1:2 mechanical relationship) 11.  Knee pain (Osteoarthritis of knee) (For every 1 lbs of added body weight, there is a 4 lbs increase in pressure inside of each knee articulation. 1:4 mechanical relationship) (patients with a BMI>30 kg/m2 were 6.8  times more likely to develop knee OA than normal-weight individuals) 12.  Cancer: Epidemiological studies have shown that obesity is a risk factor for: post-menopausal breast cancer; cancers of the endometrium, colon and kidney cancer; malignant adenomas of the oesophagus. Obese subjects have an approximately 1.5-3.5-fold increased risk of developing these cancers compared with normal-weight subjects, and it has been estimated that between 15 and 45% of these cancers can be attributed to overweight. More recent studies suggest that obesity may also increase the risk of other types of cancer, including pancreatic, hepatic and gallbladder cancer. (Ref: Obesity and cancer. Pischon T, Nthlings U, [employer] H. Proc Nutr Soc. 2008 May;67(2):128-45. doi: 63.1497/W2637858850277412.) The International Agency for Research on Cancer (IARC) has identified 13 cancers associated with overweight and obesity: meningioma, multiple myeloma, adenocarcinoma of the esophagus, and cancers of the thyroid, postmenopausal breast cancer, gallbladder, stomach, liver, pancreas, kidney, ovaries, uterus, colon and rectal (colorectal) cancers. 49 percent of all cancers diagnosed in women and 24 percent of those diagnosed in men are associated with overweight and obesity.  Recommendation: At this point it is urgent that you take a step back and concentrate in loosing weight. Dedicate 100% of your efforts on this task. Nothing else will improve your health more than bringing your weight down and your BMI to less than 30. If you are here, you probably have chronic pain. We know that most chronic pain patients have difficulty exercising secondary to their pain. For this reason, you must rely on proper nutrition and diet in order to lose the weight. If your BMI is above 40, you should seriously consider bariatric surgery. A realistic goal is to lose 10% of your body weight over a period of 12 months.  Be honest to yourself, if over time you have  unsuccessfully tried to lose weight, then it is time for you to seek professional help and to enter a medically supervised weight management program, and/or undergo bariatric surgery. Stop procrastinating.   Pain management considerations:  1.    Pharmacological Problems: Be advised that the use of opioid analgesics (oxycodone; hydrocodone; morphine; methadone; codeine; and all of their derivatives) have been associated with decreased metabolism and weight gain.  For this reason, should we see that you are unable to lose weight while taking these medications, it may become necessary for [place] to taper down and indefinitely discontinue them.  2.    Technical Problems: The incidence of successful interventional therapies decreases as the patient's BMI increases. It is much more difficult to accomplish a safe and effective interventional therapy on a patient with a BMI above 35. 3.    Radiation Exposure Problems: The x-rays machine, used to accomplish injection therapies, will automatically increase their x-ray output in order to capture an appropriate bone image. This means that radiation exposure increases exponentially with the patient's BMI. (The higher the BMI, the higher the radiation exposure.) Although the level of radiation used at a given time is still safe to the patient, it is not for the physician and/or assisting staff. Unfortunately, radiation exposure is accumulative. Because physicians and the staff have to do procedures and be exposed on a daily basis, this can result in health problems such as cancer and radiation  burns. Radiation exposure to the staff is monitored by the radiation batches that they wear. The exposure levels are reported back to the staff on a quarterly basis. Depending on levels of exposure, physicians and staff may be obligated by law to decrease this exposure. This means that they have the right and obligation to refuse providing therapies where they may be overexposed to  radiation. For this reason, physicians may decline to offer therapies such as radiofrequency ablation or implants to patients with a BMI above 40. 4.    Current Trends: Be advised that the current trend is to no longer offer certain therapies to patients with a BMI equal to, or above 35, due to increase perioperative risks, increased technical procedural difficulties, and excessive radiation exposure to healthcare personnel.  ______________________________________________________________________________________________

## 2020-08-07 ENCOUNTER — Encounter: Payer: Self-pay | Admitting: Pain Medicine

## 2020-08-07 ENCOUNTER — Ambulatory Visit
Admission: RE | Admit: 2020-08-07 | Discharge: 2020-08-07 | Disposition: A | Discharge status: Home / Self Care | Payer: Medicare Other | Source: Ambulatory Visit | Attending: Pain Medicine | Admitting: Pain Medicine

## 2020-08-07 ENCOUNTER — Other Ambulatory Visit: Payer: Self-pay

## 2020-08-07 ENCOUNTER — Ambulatory Visit (HOSPITAL_BASED_OUTPATIENT_CLINIC_OR_DEPARTMENT_OTHER): Payer: Medicare Other | Admitting: Pain Medicine

## 2020-08-07 VITALS — BP 139/81 | HR 38 | Temp 97.3°F | Resp 16 | Ht 68.0 in | Wt 250.0 lb

## 2020-08-07 DIAGNOSIS — M47817 Spondylosis without myelopathy or radiculopathy, lumbosacral region: Secondary | ICD-10-CM

## 2020-08-07 DIAGNOSIS — M545 Low back pain, unspecified: Secondary | ICD-10-CM

## 2020-08-07 DIAGNOSIS — M5137 Other intervertebral disc degeneration, lumbosacral region: Secondary | ICD-10-CM

## 2020-08-07 DIAGNOSIS — M4310 Spondylolisthesis, site unspecified: Secondary | ICD-10-CM | POA: Diagnosis not present

## 2020-08-07 DIAGNOSIS — M47816 Spondylosis without myelopathy or radiculopathy, lumbar region: Secondary | ICD-10-CM | POA: Diagnosis not present

## 2020-08-07 DIAGNOSIS — G8929 Other chronic pain: Secondary | ICD-10-CM | POA: Diagnosis not present

## 2020-08-07 MED ORDER — FENTANYL CITRATE (PF) 100 MCG/2ML IJ SOLN
25.0000 ug | INTRAMUSCULAR | Status: DC | PRN
  Administered 2020-08-07: 50 ug via INTRAVENOUS

## 2020-08-07 MED ORDER — ROPIVACAINE HCL 2 MG/ML IJ SOLN
9.0000 mL | Freq: Once | INTRAMUSCULAR | Status: AC
  Administered 2020-08-07: 9 mL via PERINEURAL

## 2020-08-07 MED ORDER — TRIAMCINOLONE ACETONIDE 40 MG/ML IJ SUSP
INTRAMUSCULAR | Status: AC
  Filled 2020-08-07: qty 1

## 2020-08-07 MED ORDER — LIDOCAINE HCL 2 % IJ SOLN
20.0000 mL | Freq: Once | INTRAMUSCULAR | Status: AC
  Administered 2020-08-07: 200 mg

## 2020-08-07 MED ORDER — TRIAMCINOLONE ACETONIDE 40 MG/ML IJ SUSP
40.0000 mg | Freq: Once | INTRAMUSCULAR | Status: AC
  Administered 2020-08-07: 40 mg

## 2020-08-07 MED ORDER — LIDOCAINE HCL (PF) 2 % IJ SOLN
INTRAMUSCULAR | Status: AC
  Filled 2020-08-07: qty 10

## 2020-08-07 MED ORDER — MIDAZOLAM HCL 5 MG/5ML IJ SOLN
INTRAMUSCULAR | Status: AC
  Filled 2020-08-07: qty 5

## 2020-08-07 MED ORDER — ROPIVACAINE HCL 2 MG/ML IJ SOLN
INTRAMUSCULAR | Status: AC
  Filled 2020-08-07: qty 10

## 2020-08-07 MED ORDER — MIDAZOLAM HCL 5 MG/5ML IJ SOLN
1.0000 mg | INTRAMUSCULAR | Status: DC | PRN
  Administered 2020-08-07: 2 mg via INTRAVENOUS

## 2020-08-07 MED ORDER — FENTANYL CITRATE (PF) 100 MCG/2ML IJ SOLN
INTRAMUSCULAR | Status: AC
  Filled 2020-08-07: qty 2

## 2020-08-07 MED ORDER — LACTATED RINGERS IV SOLN
1000.0000 mL | Freq: Once | INTRAVENOUS | Status: AC
  Administered 2020-08-07: 1000 mL via INTRAVENOUS

## 2020-08-07 NOTE — Progress Notes (Signed)
PROVIDER NOTE: Information contained herein reflects review and annotations entered in association with encounter. Interpretation of such information and data should be left to medically-trained personnel. Information provided to patient can be located elsewhere in the medical record under "Patient Instructions". Document created using STT-dictation technology, any transcriptional errors that may result from process are unintentional.    Patient: Hector [name]  Service Category: Procedure  Provider: Oswaldo Done, MD  DOB: Mar 24, 1935  DOS: 08/07/2020  Location: [facility]  MRN: [id number]  Setting: Ambulatory - outpatient  Referring Provider: Dale [place], MD  Type: Established Patient  Specialty: Interventional Pain Management  PCP: Dale Zapata, MD   Primary Reason for Visit: Interventional Pain Management Treatment. CC: Back Pain (Lower back)  Procedure:          Anesthesia, Analgesia, Anxiolysis:  Type: Lumbar Facet, Medial Branch Block(s) #3  Primary Purpose: Diagnostic Region: Posterolateral Lumbosacral Spine Level: L2, L3, L4, L5, & S1 Medial Branch Level(s). Injecting these levels blocks the L3-4, L4-5, and L5-S1 lumbar facet joints. Laterality: Right  Type: Moderate (Conscious) Sedation combined with Local Anesthesia Indication(s): Analgesia and Anxiety Route: Intravenous (IV) IV Access: Secured Sedation: Meaningful verbal contact was maintained at all times during the procedure  Local Anesthetic: Lidocaine 1-2%  Position: Prone   Indications: 1. Lumbar facet syndrome (Bilateral) (L>R)   2. Spondylosis without myelopathy or radiculopathy, lumbosacral region   3. Lumbar facet osteoarthritis (Bilateral)   4. Lumbar facet arthropathy (Bilateral)   5. DDD (degenerative disc disease), lumbosacral   6. Lumbosacral Grade 1 Anterolisthesis of L5 over S1   7. Chronic low back pain (Bilateral) w/o sciatica    Pain Score: Pre-procedure: 3 /10 Post-procedure:  0-No pain/10   Pre-op H&P Assessment:  Hector [name] is a 86 y.o. (year old), male patient, seen today for interventional treatment. He  has a past surgical history that includes Prostate surgery; cystocopy (2003); Hemorrhoid surgery; Cardiac catheterization; sleep study; Colon surgery (2006-2008-2011); Cataract Surgery (Right, 02/13/14); Esophagogastroduodenoscopy (egd) with propofol (N/A, 08/04/2016); Colonoscopy w/ polypectomy; Colonoscopy with propofol (N/A, 02/15/2018); Colonoscopy with propofol (N/A, 03/24/2020); and Esophagogastroduodenoscopy (egd) with propofol (N/A, 03/24/2020). Hector [name] has a current medication list which includes the following prescription(s): acetaminophen, allopurinol, amlodipine, aspirin, calcium carbonate, fluticasone, hydralazine, hyoscyamine, irbesartan, l-lysine, levothyroxine, omeprazole, rosuvastatin, sennosides, [START ON 08/08/2020] tramadol, valacyclovir, and calcium plus d3 absorbable, and the following Facility-Administered Medications: fentanyl and midazolam. His primarily concern today is the Back Pain (Lower back)  Initial Vital Signs:  Pulse/HCG Rate: (!) 38 (irregular)ECG Heart Rate: 77 Temp: (!) 97.2 F (36.2 C) Resp: 16 BP: (!) 147/89 SpO2: 98 %  BMI: Estimated body mass index is 38.01 kg/m as calculated from the following:   Height as of this encounter: 5\' 8"  (1.727 m).   Weight as of this encounter: 250 lb (113.4 kg).  Risk Assessment: Allergies: Reviewed. He is allergic to tape.  Allergy Precautions: None required Coagulopathies: Reviewed. None identified.  Blood-thinner therapy: None at this time Active Infection(s): Reviewed. None identified. Hector [name] is afebrile  Site Confirmation: Hector [name] was asked to confirm the procedure and laterality before marking the site Procedure checklist: Completed Consent: Before the procedure and under the influence of no sedative(s), amnesic(s), or anxiolytics, the patient was informed of the treatment options, risks  and possible complications. To fulfill our ethical and legal obligations, as recommended by the American Medical Association's Code of Ethics, I have informed the patient of my clinical impression; the nature and purpose of the  treatment or procedure; the risks, benefits, and possible complications of the intervention; the alternatives, including doing nothing; the risk(s) and benefit(s) of the alternative treatment(s) or procedure(s); and the risk(s) and benefit(s) of doing nothing. The patient was provided information about the general risks and possible complications associated with the procedure. These may include, but are not limited to: failure to achieve desired goals, infection, bleeding, organ or nerve damage, allergic reactions, paralysis, and death. In addition, the patient was informed of those risks and complications associated to Spine-related procedures, such as failure to decrease pain; infection (i.e.: Meningitis, epidural or intraspinal abscess); bleeding (i.e.: epidural hematoma, subarachnoid hemorrhage, or any other type of intraspinal or peri-dural bleeding); organ or nerve damage (i.e.: Any type of peripheral nerve, nerve root, or spinal cord injury) with subsequent damage to sensory, motor, and/or autonomic systems, resulting in permanent pain, numbness, and/or weakness of one or several areas of the body; allergic reactions; (i.e.: anaphylactic reaction); and/or death. Furthermore, the patient was informed of those risks and complications associated with the medications. These include, but are not limited to: allergic reactions (i.e.: anaphylactic or anaphylactoid reaction(s)); adrenal axis suppression; blood sugar elevation that in diabetics may result in ketoacidosis or comma; water retention that in patients with history of congestive heart failure may result in shortness of breath, pulmonary edema, and decompensation with resultant heart failure; weight gain; swelling or edema;  medication-induced neural toxicity; particulate matter embolism and blood vessel occlusion with resultant organ, and/or nervous system infarction; and/or aseptic necrosis of one or more joints. Finally, the patient was informed that Medicine is not an exact science; therefore, there is also the possibility of unforeseen or unpredictable risks and/or possible complications that may result in a catastrophic outcome. The patient indicated having understood very clearly. We have given the patient no guarantees and we have made no promises. Enough time was given to the patient to ask questions, all of which were answered to the patient's satisfaction. Mr. Melchert has indicated that he wanted to continue with the procedure. Attestation: I, the ordering provider, attest that I have discussed with the patient the benefits, risks, side-effects, alternatives, likelihood of achieving goals, and potential problems during recovery for the procedure that I have provided informed consent. Date  Time: 08/07/2020  8:36 AM  Pre-Procedure Preparation:  Monitoring: As per clinic protocol. Respiration, ETCO2, SpO2, BP, heart rate and rhythm monitor placed and checked for adequate function Safety Precautions: Patient was assessed for positional comfort and pressure points before starting the procedure. Time-out: I initiated and conducted the "Time-out" before starting the procedure, as per protocol. The patient was asked to participate by confirming the accuracy of the "Time Out" information. Verification of the correct person, site, and procedure were performed and confirmed by me, the nursing staff, and the patient. "Time-out" conducted as per Joint Commission's Universal Protocol (UP.01.01.01). Time: 0954  Description of Procedure:          Laterality: Right Levels:  L2, L3, L4, L5, & S1 Medial Branch Level(s) Area Prepped: Posterior Lumbosacral Region DuraPrep (Iodine Povacrylex [0.7% available iodine] and Isopropyl  Alcohol, 74% w/w) Safety Precautions: Aspiration looking for blood return was conducted prior to all injections. At no point did we inject any substances, as a needle was being advanced. Before injecting, the patient was told to immediately notify me if he was experiencing any new onset of "ringing in the ears, or metallic taste in the mouth". No attempts were made at seeking any paresthesias. Safe injection practices and  needle disposal techniques used. Medications properly checked for expiration dates. SDV (single dose vial) medications used. After the completion of the procedure, all disposable equipment used was discarded in the proper designated medical waste containers. Local Anesthesia: Protocol guidelines were followed. The patient was positioned over the fluoroscopy table. The area was prepped in the usual manner. The time-out was completed. The target area was identified using fluoroscopy. A 12-in long, straight, sterile hemostat was used with fluoroscopic guidance to locate the targets for each level blocked. Once located, the skin was marked with an approved surgical skin marker. Once all sites were marked, the skin (epidermis, dermis, and hypodermis), as well as deeper tissues (fat, connective tissue and muscle) were infiltrated with a small amount of a short-acting local anesthetic, loaded on a 10cc syringe with a 25G, 1.5-in  Needle. An appropriate amount of time was allowed for local anesthetics to take effect before proceeding to the next step. Local Anesthetic: Lidocaine 2.0% The unused portion of the local anesthetic was discarded in the proper designated containers. Technical explanation of process:  L2 Medial Branch Nerve Block (MBB): The target area for the L2 medial branch is at the junction of the postero-lateral aspect of the superior articular process and the superior, posterior, and medial edge of the transverse process of L3. Under fluoroscopic guidance, a Quincke needle was inserted  until contact was made with os over the superior postero-lateral aspect of the pedicular shadow (target area). After negative aspiration for blood, 0.5 mL of the nerve block solution was injected without difficulty or complication. The needle was removed intact. L3 Medial Branch Nerve Block (MBB): The target area for the L3 medial branch is at the junction of the postero-lateral aspect of the superior articular process and the superior, posterior, and medial edge of the transverse process of L4. Under fluoroscopic guidance, a Quincke needle was inserted until contact was made with os over the superior postero-lateral aspect of the pedicular shadow (target area). After negative aspiration for blood, 0.5 mL of the nerve block solution was injected without difficulty or complication. The needle was removed intact. L4 Medial Branch Nerve Block (MBB): The target area for the L4 medial branch is at the junction of the postero-lateral aspect of the superior articular process and the superior, posterior, and medial edge of the transverse process of L5. Under fluoroscopic guidance, a Quincke needle was inserted until contact was made with os over the superior postero-lateral aspect of the pedicular shadow (target area). After negative aspiration for blood, 0.5 mL of the nerve block solution was injected without difficulty or complication. The needle was removed intact. L5 Medial Branch Nerve Block (MBB): The target area for the L5 medial branch is at the junction of the postero-lateral aspect of the superior articular process and the superior, posterior, and medial edge of the sacral ala. Under fluoroscopic guidance, a Quincke needle was inserted until contact was made with os over the superior postero-lateral aspect of the pedicular shadow (target area). After negative aspiration for blood, 0.5 mL of the nerve block solution was injected without difficulty or complication. The needle was removed intact. S1 Medial Branch  Nerve Block (MBB): The target area for the S1 medial branch is at the posterior and inferior 6 o'clock position of the L5-S1 facet joint. Under fluoroscopic guidance, the Quincke needle inserted for the L5 MBB was redirected until contact was made with os over the inferior and postero aspect of the sacrum, at the 6 o' clock position under the  L5-S1 facet joint (Target area). After negative aspiration for blood, 0.5 mL of the nerve block solution was injected without difficulty or complication. The needle was removed intact.  Nerve block solution: 0.2% PF-Ropivacaine + Triamcinolone (40 mg/mL) diluted to a final concentration of 4 mg of Triamcinolone/mL of Ropivacaine The unused portion of the solution was discarded in the proper designated containers. Procedural Needles: 22-gauge, 5-inch, Quincke needles used for all levels.  Once the entire procedure was completed, the treated area was cleaned, making sure to leave some of the prepping solution back to take advantage of its long term bactericidal properties.   Illustration of the posterior view of the lumbar spine and the posterior neural structures. Laminae of L2 through S1 are labeled. DPRL5, dorsal primary ramus of L5; DPRS1, dorsal primary ramus of S1; DPR3, dorsal primary ramus of L3; FJ, facet (zygapophyseal) joint L3-L4; I, inferior articular process of L4; LB1, lateral branch of dorsal primary ramus of L1; IAB, inferior articular branches from L3 medial branch (supplies L4-L5 facet joint); IBP, intermediate branch plexus; MB3, medial branch of dorsal primary ramus of L3; NR3, third lumbar nerve root; S, superior articular process of L5; SAB, superior articular branches from L4 (supplies L4-5 facet joint also); TP3, transverse process of L3.  Vitals:   08/07/20 0959 08/07/20 1009 08/07/20 1019 08/07/20 1029  BP: (!) 149/80 138/81 132/80 139/81  Pulse:      Resp: 16 15 15 16   Temp:  (!) 97.3 F (36.3 C)  (!) 97.3 F (36.3 C)  SpO2: 100% 98%  97% 99%  Weight:      Height:         Start Time: 0954 hrs. End Time: 0959 hrs.  Imaging Guidance (Spinal):          Type of Imaging Technique: Fluoroscopy Guidance (Spinal) Indication(s): Assistance in needle guidance and placement for procedures requiring needle placement in or near specific anatomical locations not easily accessible without such assistance. Exposure Time: Please see nurses notes. Contrast: None used. Fluoroscopic Guidance: I was personally present during the use of fluoroscopy. "Tunnel Vision Technique" used to obtain the best possible view of the target area. Parallax error corrected before commencing the procedure. "Direction-depth-direction" technique used to introduce the needle under continuous pulsed fluoroscopy. Once target was reached, antero-posterior, oblique, and lateral fluoroscopic projection used confirm needle placement in all planes. Images permanently stored in EMR. Interpretation: No contrast injected. I personally interpreted the imaging intraoperatively. Adequate needle placement confirmed in multiple planes. Permanent images saved into the patient's record.  Antibiotic Prophylaxis:   Anti-infectives (From admission, onward)   None     Indication(s): None identified  Post-operative Assessment:  Post-procedure Vital Signs:  Pulse/HCG Rate: (!) 38 (irregular)78 Temp: (!) 97.3 F (36.3 C) Resp: 16 BP: 139/81 SpO2: 99 %  EBL: None  Complications: No immediate post-treatment complications observed by team, or reported by patient.  Note: The patient tolerated the entire procedure well. A repeat set of vitals were taken after the procedure and the patient was kept under observation following institutional policy, for this type of procedure. Post-procedural neurological assessment was performed, showing return to baseline, prior to discharge. The patient was provided with post-procedure discharge instructions, including a section on how to identify  potential problems. Should any problems arise concerning this procedure, the patient was given instructions to immediately contact us, at any time, without hesitation. In any case, we plan to contact the patient by telephone for a follow-up status report regarding this interventional  procedure.  Comments:  No additional relevant information.  Plan of Care  Orders:  Orders Placed This Encounter  Procedures  . LUMBAR FACET(MEDIAL BRANCH NERVE BLOCK) MBNB    Scheduling Instructions:     Procedure: Lumbar facet block (AKA.: Lumbosacral medial branch nerve block)     Side: Right-sided     Level: L3-4, L4-5, & L5-S1 Facets (L2, L3, L4, L5, & S1 Medial Branch Nerves)     Sedation: Patient's choice.     Timeframe: Today    Order Specific Question:   Where will this procedure be performed?    Answer:   [facility]  . [facility] C-ARM 1-60 MIN NO REPORT    Intraoperative interpretation by procedural physician at [place].    Standing Status:   Standing    Number of Occurrences:   1    Order Specific Question:   Reason for exam:    Answer:   Assistance in needle guidance and placement for procedures requiring needle placement in or near specific anatomical locations not easily accessible without such assistance.  . Informed Consent Details: Physician/Practitioner Attestation; Transcribe to consent form and obtain patient signature    Nursing Order: Transcribe to consent form and obtain patient signature. Note: Always confirm laterality of pain with Mr. Arola, before procedure.    Order Specific Question:   Physician/Practitioner attestation of informed consent for procedure/surgical case    Answer:   I, the physician/practitioner, attest that I have discussed with the patient the benefits, risks, side effects, alternatives, likelihood of achieving goals and potential problems during recovery for the procedure that I have provided informed consent.    Order Specific  Question:   Procedure    Answer:   Lumbar Facet Block  under fluoroscopic guidance    Order Specific Question:   Physician/Practitioner performing the procedure    Answer:   Zailyn Thoennes A. Dossie Arbour MD    Order Specific Question:   Indication/Reason    Answer:   Low Back Pain, with our without leg pain, due to Facet Joint Arthralgia (Joint Pain) Spondylosis (Arthritis of the Spine), without myelopathy or radiculopathy (Nerve Damage).  . Provide equipment / supplies at bedside    "Block Tray" (Disposable  single use) Needle type: SpinalSpinal Amount/quantity: 4 Size: Medium (5-inch) Gauge: 22G    Standing Status:   Standing    Number of Occurrences:   1    Order Specific Question:   Specify    Answer:   Block Tray   Chronic Opioid Analgesic:  Tramadol 50 mg, 1 tab PO q 8 hrs (150 mg/day of tramadol) MME/day:15mg /day.   Medications ordered for procedure: Meds ordered this encounter  Medications  . lidocaine (XYLOCAINE) 2 % (with pres) injection 400 mg  . lactated ringers infusion 1,000 mL  . midazolam (VERSED) 5 MG/5ML injection 1-2 mg    Make sure Flumazenil is available in the pyxis when using this medication. If oversedation occurs, administer 0.2 mg IV over 15 sec. If after 45 sec no response, administer 0.2 mg again over 1 min; may repeat at 1 min intervals; not to exceed 4 doses (1 mg)  . fentaNYL (SUBLIMAZE) injection 25-50 mcg    Make sure Narcan is available in the pyxis when using this medication. In the event of respiratory depression (RR< 8/min): Titrate NARCAN (naloxone) in increments of 0.1 to 0.2 mg IV at 2-3 minute intervals, until desired degree of reversal.  . ropivacaine (PF) 2 mg/mL (0.2%) (NAROPIN) injection  9 mL  . triamcinolone acetonide (KENALOG-40) injection 40 mg   Medications administered: We administered lidocaine, lactated ringers, midazolam, fentaNYL, ropivacaine (PF) 2 mg/mL (0.2%), and triamcinolone acetonide.  See the medical record for exact dosing,  route, and time of administration.  Follow-up plan:   Return in about 2 weeks (around 08/21/2020) for (F2F), (PP) Follow-up.       Interventional management options: Planned, scheduled, and/or pending:      Considering:   Diagnostic left L5 TFESI  Possible left L5 nerve root ganglion RFA Diagnosticleft L4-5 LESI   Palliative PRN treatment(s):   Diagnostic midline caudal ESI #3  Palliativeright lumbar facet block #4 Palliativeleft lumbar facet block #3 Palliative right lumbar facet RFA #2 (last done 04/18/2018) (difficult due to patient moving and anatomy.)  Palliative left lumbar facet RFA #2 (last done 03/21/2018) (difficult due to patient moving and anatomy.)     Recent Visits Date Type Provider Dept  08/04/20 Office Visit Milinda Pointer, MD [facility]  Showing recent visits within past 90 days and meeting all other requirements Today's Visits Date Type Provider Dept  08/07/20 Procedure visit Milinda Pointer, MD [facility]  Showing today's visits and meeting all other requirements Future Appointments Date Type Provider Dept  08/25/20 Appointment Milinda Pointer, MD [facility]  11/05/20 Appointment Milinda Pointer, MD [facility]  Showing future appointments within next 90 days and meeting all other requirements  Disposition: Discharge home  Discharge (Date  Time): 08/07/2020; 1031 hrs.   Primary Care Physician: Einar Pheasant, MD Location: [facility] Note by: Gaspar Cola, MD Date: 08/07/2020; Time: 12:54 PM  Disclaimer:  Medicine is not an Chief Strategy Officer. The only guarantee in medicine is that nothing is guaranteed. It is important to note that the decision to proceed with this intervention was based on the information collected from the patient. The Data and conclusions were drawn from the patient's questionnaire, the interview, and the physical examination. Because the  information was provided in large part by the patient, it cannot be guaranteed that it has not been purposely or unconsciously manipulated. Every effort has been made to obtain as much relevant data as possible for this evaluation. It is important to note that the conclusions that lead to this procedure are derived in large part from the available data. Always take into account that the treatment will also be dependent on availability of resources and existing treatment guidelines, considered by other Pain Management Practitioners as being common knowledge and practice, at the time of the intervention. For Medico-Legal purposes, it is also important to point out that variation in procedural techniques and pharmacological choices are the acceptable norm. The indications, contraindications, technique, and results of the above procedure should only be interpreted and judged by a Board-Certified Interventional Pain Specialist with extensive familiarity and expertise in the same exact procedure and technique.

## 2020-08-07 NOTE — Progress Notes (Signed)
Safety precautions to be maintained throughout the outpatient stay will include: orient to surroundings, keep bed in low position, maintain call bell within reach at all times, provide assistance with transfer out of bed and ambulation.  

## 2020-08-07 NOTE — Patient Instructions (Signed)

## 2020-08-08 ENCOUNTER — Telehealth: Payer: Self-pay | Admitting: *Deleted

## 2020-08-08 NOTE — Telephone Encounter (Signed)
Attempted to call for post procedure follow-up. Message left. 

## 2020-08-11 ENCOUNTER — Other Ambulatory Visit: Payer: Self-pay | Admitting: Internal Medicine

## 2020-08-24 DIAGNOSIS — F1120 Opioid dependence, uncomplicated: Secondary | ICD-10-CM | POA: Insufficient documentation

## 2020-08-24 NOTE — Progress Notes (Signed)
PROVIDER NOTE: Information contained herein reflects review and annotations entered in association with encounter. Interpretation of such information and data should be left to medically-trained personnel. Information provided to patient can be located elsewhere in the medical record under "Patient Instructions". Document created using STT-dictation technology, any transcriptional errors that may result from process are unintentional.    Patient: Hector [name]  Service Category: E/M  Provider: Gaspar Cola, MD  DOB: [birth year]  DOS: 08/25/2020  Specialty: Interventional Pain Management  MRN: [id number]  Setting: Ambulatory outpatient  PCP: Hector Pheasant, MD  Type: Established Patient    Referring Provider: Einar Pheasant, MD  Location: Office  Delivery: Face-to-face     HPI  [name], [name] 86 y.o. year old male, is here today because of his Chronic pain syndrome [G89.4]. [name] primary complain today is Leg Pain Last encounter: My last encounter with him was on 08/07/2020. Pertinent problems: [name] has History of prostate cancer; Malignant melanoma (Hector [name]); Shoulder pain, left; Chronic hip pain (Left); Chronic low back pain (1ry area of Pain) (Bilateral) (L>R) w/ sciatica (Left); Chronic pain syndrome; Polyarthritis, unspecified (Secondary Area of Pain); Prostate cancer (Hector [name]); L5-S1 bilateral pars defect with spondylolisthesis; Lumbosacral Grade 1 Anterolisthesis of L5 over S1; Lumbar facet arthropathy (Bilateral); Lumbar foraminal stenosis (L5-S1) (Bilateral) (L>R); Chronic Lumbar radiculitis (L5) (Left); Chronic musculoskeletal pain; Lumbar facet syndrome (Bilateral) (L>R); Chronic lower extremity pain (Secondary Area of Pain) (Left); Chronic lower extremity radicular pain (L5) (Left); Lumbar facet osteoarthritis (Bilateral); Osteoarthritis of lumbar spine; Lumbar spondylosis; Spondylosis without myelopathy or radiculopathy, lumbosacral region; Chronic right shoulder pain; DDD  (degenerative disc disease), lumbosacral; Gout; and Chronic low back pain (Bilateral) w/o sciatica on their pertinent problem list. Pain Assessment: Severity of Chronic pain is reported as [name] 0-No pain/10. Location: Back  /Denies. Onset: More than [name] month ago. Quality:  . Timing: Intermittent. Modifying factor(s): procedures and meds. Vitals:  height is 5' 8" (1.727 m) and weight is 239 lb (108.4 kg). His temperature is 97.3 F (36.3 C) (abnormal). His blood pressure is 156/73 (abnormal) and his pulse is 69. His oxygen saturation is 99%.   Reason for encounter: post-procedure assessment.  The patient refers having attained 100% relief of the pain with this last procedure.  He refers that it has worked the best, compared to any of the other injections.  However, he does complain of some weakness in the right lower extremity.  Today I had him toe walk and he was only able to do that with the right leg suggesting an S1 radiculopathy.  However, he denies any pain or numbness through the distribution of the S1 nerve root.  He denies any numbness or pain in the bottom of his foot or the lateral aspect of the foot.  Sadly, he says that his wife is sick and he is very concerned about that.  He wants to hold on anything else at this time.  I have recommended that he ambulate using [name] cane on the right hand, just in case he experiences some of that weakness, so that he will have some support and avoid the possibility of falling and perhaps breaking [name] hip.  He understood and accepted.  He was told to give [place] [name] call if he needs any of our services.  He indicated he would.  RTCB: 11/06/20 Nonopioids transferred 05/07/2020: Calcium and vitamin D3.  Post-Procedure Evaluation  Procedure (08/07/2020): Palliative/therapeutic right lumbar facet MBB #3 under fluoroscopic guidance and IV sedation Pre-procedure  pain level: 3/10 Post-procedure: 0/10 (100% relief)  Sedation: Sedation provided.  Effectiveness during initial hour  after procedure(Ultra-Short Term Relief): 100 %.  Local anesthetic used: Long-acting (4-6 hours) Effectiveness: Defined as any analgesic benefit obtained secondary to the administration of local anesthetics. This carries significant diagnostic value as to the etiological location, or anatomical origin, of the pain. Duration of benefit is expected to coincide with the duration of the local anesthetic used.  Effectiveness during initial 4-6 hours after procedure(Short-Term Relief): 100 %.  Long-term benefit: Defined as any relief past the pharmacologic duration of the local anesthetics.  Effectiveness past the initial 6 hours after procedure(Long-Term Relief): 100 % (pt stated this is the best procedures. ongoing).  Current benefits: Defined as benefit that persist at this time.   Analgesia:  100% better Function: [name] reports improvement in function ROM: [name] reports improvement in ROM  Pharmacotherapy Assessment   Analgesic: Tramadol 50 mg, 1 tab PO q 8 hrs (150 mg/day of tramadol) MME/day:15mg/day.   Monitoring: [place] PMP: PDMP not reviewed this encounter.       Pharmacotherapy: No side-effects or adverse reactions reported. Compliance: No problems identified. Effectiveness: Clinically acceptable.  Brown, Hector A, RN  08/25/2020  2:55 PM  Sign when Signing Visit Nursing Pain Medication Assessment:  Safety precautions to be maintained throughout the outpatient stay will include: orient to surroundings, keep bed in low position, maintain call bell within reach at all times, provide assistance with transfer out of bed and ambulation.  Medication Inspection Compliance: Pill count conducted under aseptic conditions, in front of the patient. Neither the pills nor the bottle was removed from the patient's sight at any time. Once count was completed pills were immediately returned to the patient in their original bottle.  Medication: Tramadol (Ultram) Pill/Patch Count: 83 of 90 pills  remain Pill/Patch Appearance: Markings consistent with prescribed medication Bottle Appearance: Standard pharmacy container. Clearly labeled. Filled Date: 01 / 7 / 22 Last Medication intake:  TodaySafety precautions to be maintained throughout the outpatient stay will include: orient to surroundings, keep bed in low position, maintain call bell within reach at all times, provide assistance with transfer out of bed and ambulation.     UDS:  Summary  Date Value Ref Range Status  05/07/2020 Note  Final    Comment:    ==================================================================== [employer] Select 13 (MW) ==================================================================== Test                             Result       Flag       Units  Drug Present and Declared for Prescription Verification   Tramadol                       >1825        EXPECTED   ng/mg creat   O-Desmethyltramadol            1023         EXPECTED   ng/mg creat   N-Desmethyltramadol            >1825        EXPECTED   ng/mg creat    Source of tramadol is [name] prescription medication. O-desmethyltramadol    and N-desmethyltramadol are expected metabolites of tramadol.  ==================================================================== Test                      Result      Flag   Units      Ref Range   Creatinine              274              mg/dL      >=20 ==================================================================== Declared Medications:  The flagging and interpretation on this report are based on the  following declared medications.  Unexpected results may arise from  inaccuracies in the declared medications.   **Note: The testing scope of this panel includes these medications:   Tramadol (Ultram)   **Note: The testing scope of this panel does not include the  following reported medications:   Acetaminophen (Tylenol)  Allopurinol (Zyloprim)  Amlodipine (Norvasc)  Aspirin  Calcium  Cholecalciferol   Fluticasone (Flonase)  Hydralazine (Apresoline)  Hyoscyamine  Irbesartan (Avapro)  Levothyroxine (Synthroid)  Lysine  Omeprazole (Prilosec)  Rosuvastatin (Crestor)  Sennosides  Valacyclovir (Valtrex) ==================================================================== For clinical consultation, please call [phone]. ====================================================================      ROS  Constitutional: Denies any fever or chills Gastrointestinal: No reported hemesis, hematochezia, vomiting, or acute GI distress Musculoskeletal: Denies any acute onset joint swelling, redness, loss of ROM, or weakness Neurological: No reported episodes of acute onset apraxia, aphasia, dysarthria, agnosia, amnesia, paralysis, loss of coordination, or loss of consciousness  Medication Review  Calcium Plus D3 Absorbable, L-Lysine, Sennosides, acetaminophen, allopurinol, amLODipine, aspirin, calcium carbonate, fluticasone, hydrALAZINE, hyoscyamine, irbesartan, levothyroxine, omeprazole, rosuvastatin, traMADol, and valACYclovir  History Review  Allergy: Mr. Balfour is allergic to tape. Drug: Mr. Guerreiro  reports no history of drug use. Alcohol:  reports no history of alcohol use. Tobacco:  reports that he has never smoked. He has never used smokeless tobacco. Social: Mr. Provence  reports that he has never smoked. He has never used smokeless tobacco. He reports that he does not drink alcohol and does not use drugs. Medical:  has [name] past medical history of Acute postoperative pain (03/21/2018), Anemia, Arthritis, Barrett esophagus, Cancer ([place]), Chicken pox, Diverticulitis, Dysrhythmia, GERD (gastroesophageal reflux disease), Hyperlipidemia, Hypertension, Hypothyroidism, Melanoma ([place]), Sleep apnea, and Ulcer. Surgical: Mr. Old  has [name] past surgical history that includes Prostate surgery; cystocopy (2003); Hemorrhoid surgery; Cardiac catheterization; sleep study; Colon surgery (2006-2008-2011); Cataract  Surgery (Right, 02/13/14); Esophagogastroduodenoscopy (egd) with propofol (N/[name], 08/04/2016); Colonoscopy w/ polypectomy; Colonoscopy with propofol (N/[name], 02/15/2018); Colonoscopy with propofol (N/[name], 03/24/2020); and Esophagogastroduodenoscopy (egd) with propofol (N/[name], 03/24/2020). Family: family history includes Heart disease in his father; Kidney disease in his father; Liver disease in his mother.  Laboratory Chemistry Profile   Renal Lab Results  Component Value Date   BUN 17 06/16/2020   CREATININE 1.28 06/16/2020   BCR 10 06/13/2017   GFR 51.93 (L) 06/16/2020   GFRAA >60 01/23/2018   GFRNONAA 56 (L) 01/23/2018     Hepatic Lab Results  Component Value Date   AST 24 06/16/2020   ALT 29 06/16/2020   ALBUMIN 4.3 06/16/2020   ALKPHOS 68 06/16/2020     Electrolytes Lab Results  Component Value Date   NA 141 06/16/2020   K 4.5 06/16/2020   CL 102 06/16/2020   CALCIUM 9.6 06/16/2020   MG 2.0 06/13/2017     Bone Lab Results  Component Value Date   VD25OH 21.46 (L) 03/28/2018   25OHVITD1 20 (L) 06/13/2017   25OHVITD2 <1.0 06/13/2017   25OHVITD3 20 06/13/2017     Inflammation (CRP: Acute Phase) (ESR: Chronic Phase) Lab Results  Component Value Date   CRP 2.8 06/13/2017   ESRSEDRATE 13 06/13/2017  Note: Above Lab results reviewed.  Recent Imaging Review  DG PAIN CLINIC C-ARM 1-60 MIN NO REPORT Fluoro was used, but no Radiologist interpretation will be provided.  Please refer to "NOTES" tab for provider progress note. Note: Reviewed        Physical Exam  General appearance: Well nourished, well developed, and well hydrated. In no apparent acute distress Mental status: Alert, oriented x 3 (person, place, & time)       Respiratory: No evidence of acute respiratory distress Eyes: PERLA Vitals: BP (!) 156/73   Pulse 69   Temp (!) 97.3 F (36.3 C)   Ht 5' 8" (1.727 m)   Wt 239 lb (108.4 kg)   SpO2 99%   BMI 36.34 kg/m  BMI: Estimated body mass index is 36.34  kg/m as calculated from the following:   Height as of this encounter: 5' 8" (1.727 m).   Weight as of this encounter: 239 lb (108.4 kg). Ideal: Ideal body weight: 68.4 kg (150 lb 12.7 oz) Adjusted ideal body weight: 84.4 kg (186 lb 1.2 oz)  Assessment   Status Diagnosis  Controlled Controlled Controlled 1. Chronic pain syndrome   2. Lumbar facet syndrome (Bilateral) (L>R)   3. Lumbar facet arthropathy (Bilateral)   4. Chronic low back pain (Bilateral) w/o sciatica   5. Pharmacologic therapy   6. Uncomplicated opioid dependence (HCC)      Updated Problems: No problems updated.  Plan of Care  Problem-specific:  No problem-specific Assessment & Plan notes found for this encounter.  Mr. Danilo [name] Rosensteel has [name] current medication list which includes the following long-term medication(s): allopurinol, amlodipine, fluticasone, hydralazine, hyoscyamine, irbesartan, levothyroxine, omeprazole, rosuvastatin, tramadol, calcium plus d3 absorbable, and calcium carbonate.  Pharmacotherapy (Medications Ordered): No orders of the defined types were placed in this encounter.  Orders:  No orders of the defined types were placed in this encounter.  Follow-up plan:   Return in about 2 months (around 11/06/2020) for (F2F), (Med Mgmt).      Interventional Therapies  Risk  Complexity Considerations:   POOR Candidate for any more RFA due to movement and body habitus.   Planned  Pending:   Pending further evaluation   Under consideration:   Diagnostic left L5 TFESI  Possible left L5 nerve root ganglion RFA Diagnosticleft L4-5 LESI   Completed:   Diagnostic midline caudal ESI x2 (07/03/2019) Palliativeright lumbar facet block x3(08/07/2020) Palliativeleft lumbar facet block x2(10/27/2017) Palliative right lumbar facet RFA x1 (04/18/2018)  Palliative left lumbar facet RFA x1 (03/21/2018)    Therapeutic  Palliative (PRN) options:   Diagnostic midline caudal ESI #3  Palliativeright lumbar  facet block #4 Palliativeleft lumbar facet block #3 Palliative right lumbar facet RFA #2  Palliative left lumbar facet RFA #2     Recent Visits Date Type Provider Dept  08/07/20 Procedure visit Naveira, Francisco, MD [facility]  08/04/20 Office Visit Naveira, Francisco, MD [facility]  Showing recent visits within past 90 days and meeting all other requirements Today's Visits Date Type Provider Dept  08/25/20 Office Visit Naveira, Francisco, MD [facility]  Showing today's visits and meeting all other requirements Future Appointments Date Type Provider Dept  11/05/20 Appointment Naveira, Francisco, MD [facility]  Showing future appointments within next 90 days and meeting all other requirements  I discussed the assessment and treatment plan with the patient. The patient was provided an opportunity to ask questions and all were answered. The patient agreed with the plan   and demonstrated an understanding of the instructions.  Patient advised to call back or seek an in-person evaluation if the symptoms or condition worsens.  Duration of encounter: 30 minutes.  Note by: Francisco [name] Naveira, MD Date: 08/25/2020; Time: 3:23 PM 

## 2020-08-25 ENCOUNTER — Other Ambulatory Visit: Payer: Self-pay

## 2020-08-25 ENCOUNTER — Ambulatory Visit: Payer: Medicare Other | Admitting: Pain Medicine

## 2020-08-25 ENCOUNTER — Ambulatory Visit: Payer: Medicare Other | Attending: Pain Medicine | Admitting: Pain Medicine

## 2020-08-25 VITALS — BP 156/73 | HR 69 | Temp 97.3°F | Ht 68.0 in | Wt 239.0 lb

## 2020-08-25 DIAGNOSIS — F1120 Opioid dependence, uncomplicated: Secondary | ICD-10-CM | POA: Insufficient documentation

## 2020-08-25 DIAGNOSIS — M47816 Spondylosis without myelopathy or radiculopathy, lumbar region: Secondary | ICD-10-CM | POA: Diagnosis not present

## 2020-08-25 DIAGNOSIS — G8929 Other chronic pain: Secondary | ICD-10-CM | POA: Diagnosis not present

## 2020-08-25 DIAGNOSIS — Z79899 Other long term (current) drug therapy: Secondary | ICD-10-CM | POA: Insufficient documentation

## 2020-08-25 DIAGNOSIS — G894 Chronic pain syndrome: Secondary | ICD-10-CM | POA: Diagnosis not present

## 2020-08-25 DIAGNOSIS — M545 Low back pain, unspecified: Secondary | ICD-10-CM | POA: Insufficient documentation

## 2020-08-25 NOTE — Progress Notes (Signed)
Nursing Pain Medication Assessment:  Safety precautions to be maintained throughout the outpatient stay will include: orient to surroundings, keep bed in low position, maintain call bell within reach at all times, provide assistance with transfer out of bed and ambulation.  Medication Inspection Compliance: Pill count conducted under aseptic conditions, in front of the patient. Neither the pills nor the bottle was removed from the patient's sight at any time. Once count was completed pills were immediately returned to the patient in their original bottle.  Medication: Tramadol (Ultram) Pill/Patch Count: 83 of 90 pills remain Pill/Patch Appearance: Markings consistent with prescribed medication Bottle Appearance: Standard pharmacy container. Clearly labeled. Filled Date: 01 / 7 / 22 Last Medication intake:  TodaySafety precautions to be maintained throughout the outpatient stay will include: orient to surroundings, keep bed in low position, maintain call bell within reach at all times, provide assistance with transfer out of bed and ambulation.

## 2020-08-28 ENCOUNTER — Other Ambulatory Visit: Payer: Self-pay | Admitting: Internal Medicine

## 2020-09-12 ENCOUNTER — Other Ambulatory Visit: Payer: Self-pay | Admitting: Internal Medicine

## 2020-09-26 ENCOUNTER — Other Ambulatory Visit: Payer: Self-pay | Admitting: Internal Medicine

## 2020-10-14 ENCOUNTER — Ambulatory Visit (INDEPENDENT_AMBULATORY_CARE_PROVIDER_SITE_OTHER): Payer: Medicare Other | Admitting: Internal Medicine

## 2020-10-14 ENCOUNTER — Encounter: Payer: Self-pay | Admitting: Internal Medicine

## 2020-10-14 ENCOUNTER — Other Ambulatory Visit: Payer: Self-pay

## 2020-10-14 VITALS — BP 122/70 | HR 83 | Temp 98.2°F | Resp 16 | Ht 68.0 in | Wt 233.4 lb

## 2020-10-14 DIAGNOSIS — K2270 Barrett's esophagus without dysplasia: Secondary | ICD-10-CM | POA: Diagnosis not present

## 2020-10-14 DIAGNOSIS — I10 Essential (primary) hypertension: Secondary | ICD-10-CM

## 2020-10-14 DIAGNOSIS — M109 Gout, unspecified: Secondary | ICD-10-CM

## 2020-10-14 DIAGNOSIS — E559 Vitamin D deficiency, unspecified: Secondary | ICD-10-CM

## 2020-10-14 DIAGNOSIS — E039 Hypothyroidism, unspecified: Secondary | ICD-10-CM | POA: Diagnosis not present

## 2020-10-14 DIAGNOSIS — Z85820 Personal history of malignant melanoma of skin: Secondary | ICD-10-CM

## 2020-10-14 DIAGNOSIS — R739 Hyperglycemia, unspecified: Secondary | ICD-10-CM

## 2020-10-14 DIAGNOSIS — Z8546 Personal history of malignant neoplasm of prostate: Secondary | ICD-10-CM

## 2020-10-14 DIAGNOSIS — E7800 Pure hypercholesterolemia, unspecified: Secondary | ICD-10-CM | POA: Diagnosis not present

## 2020-10-14 LAB — LIPID PANEL
Cholesterol: 132 mg/dL (ref 0–200)
HDL: 53.9 mg/dL (ref >39.00)
LDL Cholesterol: 41 mg/dL (ref 0–99)
NonHDL: 78.21
Total CHOL/HDL Ratio: 2
Triglycerides: 184 mg/dL — ABNORMAL HIGH (ref 0.0–149.0)
VLDL: 36.8 mg/dL (ref 0.0–40.0)

## 2020-10-14 LAB — BASIC METABOLIC PANEL
BUN: 14 mg/dL (ref 6–23)
CO2: 28 mEq/L (ref 19–32)
Calcium: 9.9 mg/dL (ref 8.4–10.5)
Chloride: 104 mEq/L (ref 96–112)
Creatinine, Ser: 1.34 mg/dL (ref 0.40–1.50)
GFR: 49.04 mL/min — ABNORMAL LOW (ref >60.00)
Glucose, Bld: 97 mg/dL (ref 70–99)
Potassium: 4 mEq/L (ref 3.5–5.1)
Sodium: 141 mEq/L (ref 135–145)

## 2020-10-14 LAB — HEPATIC FUNCTION PANEL
ALT: 18 U/L (ref 0–53)
AST: 17 U/L (ref 0–37)
Albumin: 4.3 g/dL (ref 3.5–5.2)
Alkaline Phosphatase: 67 U/L (ref 39–117)
Bilirubin, Direct: 0.1 mg/dL (ref 0.0–0.3)
Total Bilirubin: 0.5 mg/dL (ref 0.2–1.2)
Total Protein: 7.1 g/dL (ref 6.0–8.3)

## 2020-10-14 LAB — HEMOGLOBIN A1C: Hgb A1c MFr Bld: 5.3 % (ref 4.6–6.5)

## 2020-10-14 NOTE — Progress Notes (Signed)
[name], male   DOB: 09/27/1934, 86 y.o.   MRN: [id number]   Subjective:    [name], male    DOB: 1935-02-22, 86 y.o.   MRN: [id number]  HPI This visit occurred during the SARS-CoV-2 public health emergency.  Safety protocols were in place, including screening questions prior to the visit, additional usage of staff PPE, and extensive cleaning of exam room while observing appropriate contact time as indicated for disinfecting solutions.  Patient here for a scheduled follow up.  Here to follow up regarding his blood pressure, cholesterol.  He has adjusted his diet.  Lost weight.  Feels better.  Back pain is better.  Knee pain is better - since he lost weight. No chest pain or sob reported.  No abdominal pain.  Bowels moving.  Overall feels good.    Past Medical History:  Diagnosis Date  . Acute postoperative pain 03/21/2018  . Anemia   . Arthritis   . Barrett esophagus   . Cancer ([place])    prostate,skin  . Chicken pox   . Diverticulitis   . Dysrhythmia   . GERD (gastroesophageal reflux disease)   . Hyperlipidemia   . Hypertension   . Hypothyroidism   . Melanoma ([place])    Malignant resection  . Sleep apnea   . Ulcer    Past Surgical History:  Procedure Laterality Date  . CARDIAC CATHETERIZATION    . Cataract Surgery Right 02/13/14  . COLON SURGERY  2006-2008-2011   polyps removed  . COLONOSCOPY W/ POLYPECTOMY    . COLONOSCOPY WITH PROPOFOL N/A 02/15/2018   Procedure: COLONOSCOPY WITH PROPOFOL;  Surgeon: Manya Silvas, MD;  Location: [facility] ENDOSCOPY;  Service: Endoscopy;  Laterality: N/A;  . COLONOSCOPY WITH PROPOFOL N/A 03/24/2020   Procedure: COLONOSCOPY WITH PROPOFOL;  Surgeon: Lesly Rubenstein, MD;  Location: [facility] ENDOSCOPY;  Service: Endoscopy;  Laterality: N/A;  . cystocopy  2003  . ESOPHAGOGASTRODUODENOSCOPY (EGD) WITH PROPOFOL N/A 08/04/2016   Procedure: ESOPHAGOGASTRODUODENOSCOPY (EGD) WITH PROPOFOL;  Surgeon: Manya Silvas, MD;   Location[facility] ENDOSCOPY;  Service: Endoscopy;  Laterality: N/A;  . ESOPHAGOGASTRODUODENOSCOPY (EGD) WITH PROPOFOL N/A 03/24/2020   Procedure: ESOPHAGOGASTRODUODENOSCOPY (EGD) WITH PROPOFOL;  Surgeon: Lesly Rubenstein, MD;  Location: [facility] ENDOSCOPY;  Service: Endoscopy;  Laterality: N/A;  . HEMORRHOID SURGERY    . PROSTATE SURGERY    . sleep study     Family History  Problem Relation Age of Onset  . Liver disease Mother   . Heart disease Father   . Kidney disease Father    Social History   Socioeconomic History  . Marital status: Married    Spouse name: Not on file  . Number of children: Not on file  . Years of education: Not on file  . Highest education level: Not on file  Occupational History  . Not on file  Tobacco Use  . Smoking status: Never Smoker  . Smokeless tobacco: Never Used  Vaping Use  . Vaping Use: Never used  Substance and Sexual Activity  . Alcohol use: No    Alcohol/week: 0.0 standard drinks  . Drug use: No  . Sexual activity: Never  Other Topics Concern  . Not on file  Social History Narrative  . Not on file   Social Determinants of Health   Financial Resource Strain: Low Risk   . Difficulty of Paying Living Expenses: Not hard at all  Food Insecurity: No Food Insecurity  . Worried About Running  Out of Food in the Last Year: Never true  . Ran Out of Food in the Last Year: Never true  Transportation Needs: No Transportation Needs  . Lack of Transportation (Medical): No  . Lack of Transportation (Non-Medical): No  Physical Activity: Not on file  Stress: No Stress Concern Present  . Feeling of Stress : Not at all  Social Connections: Unknown  . Frequency of Communication with Friends and Family: Not on file  . Frequency of Social Gatherings with Friends and Family: Not on file  . Attends Religious Services: Not on file  . Active Member of Clubs or Organizations: Not on file  . Attends Archivist Meetings: Not on file  . Marital  Status: Married    Outpatient Encounter Medications as of 10/14/2020  Medication Sig  . acetaminophen (TYLENOL) 500 MG tablet Take 500 mg by mouth every 6 (six) hours as needed.  Marland Kitchen allopurinol (ZYLOPRIM) 100 MG tablet TAKE 1 TABLET BY MOUTH DAILY  . amLODipine (NORVASC) 10 MG tablet TAKE 1 TABLET BY MOUTH DAILY  . aspirin 81 MG tablet Take 81 mg by mouth daily.  Marland Kitchen CALCIUM 600 1500 (600 Ca) MG TABS tablet TAKE 2 TABLETS BY MOUTH EVERY DAY WITH BREAKFAST  . Calcium Carb-Cholecalciferol (CALCIUM PLUS D3 ABSORBABLE) [phone] MG-UNIT CAPS Take 1 capsule by mouth daily with breakfast.  . calcium carbonate (CALCIUM 600) 600 MG TABS tablet Take 2 tablets (1,200 mg total) by mouth daily with breakfast.  . fluticasone (FLONASE) 50 MCG/ACT nasal spray TWO PUFFS IN EACH NOSTRIL ONCE A DAY  . hydrALAZINE (APRESOLINE) 25 MG tablet TAKE ONE TABLET BY MOUTH THREE TIMES A DAY  . hyoscyamine (LEVSIN, ANASPAZ) 0.125 MG tablet Take 0.125 mg by mouth as needed.  . irbesartan (AVAPRO) 300 MG tablet TAKE 1 TABLET BY MOUTH DAILY  . L-Lysine 500 MG TABS Take by mouth daily.  Marland Kitchen levothyroxine (SYNTHROID) 100 MCG tablet TAKE 1 TABLET BY MOUTH DAILY WITH BREAKFAST  . omeprazole (PRILOSEC) 20 MG capsule TAKE ONE CAPSULE BY MOUTH TWICE A DAY  . rosuvastatin (CRESTOR) 5 MG tablet TAKE 1 TABLET BY MOUTH DAILY  . Sennosides (LAX-PILLS PO) Take 2 mg by mouth.  . traMADol (ULTRAM) 50 MG tablet Take 1 tablet (50 mg total) by mouth every 8 (eight) hours as needed for severe pain.  . valACYclovir (VALTREX) 1000 MG tablet TAKE 1 TABLET BY MOUTH DAILY   No facility-administered encounter medications on file as of 10/14/2020.    Review of Systems  Constitutional: Negative for appetite change and unexpected weight change.  HENT: Negative for congestion and sinus pressure.   Respiratory: Negative for cough, chest tightness and shortness of breath.   Cardiovascular: Negative for chest pain, palpitations and leg swelling.   Gastrointestinal: Negative for abdominal pain, diarrhea, nausea and vomiting.  Genitourinary: Negative for difficulty urinating and dysuria.  Musculoskeletal: Negative for joint swelling and myalgias.  Skin: Negative for color change and rash.  Neurological: Negative for dizziness, light-headedness and headaches.  Psychiatric/Behavioral: Negative for agitation and dysphoric mood.       Objective:    Physical Exam Vitals reviewed.  Constitutional:      General: He is not in acute distress.    Appearance: Normal appearance. He is well-developed.  HENT:     Head: Normocephalic and atraumatic.  Eyes:     General: No scleral icterus.       Right eye: No discharge.        Left eye: No discharge.  Conjunctiva/sclera: Conjunctivae normal.  Cardiovascular:     Rate and Rhythm: Normal rate and regular rhythm.  Pulmonary:     Effort: Pulmonary effort is normal. No respiratory distress.     Breath sounds: Normal breath sounds.  Abdominal:     General: Bowel sounds are normal.     Palpations: Abdomen is soft.     Tenderness: There is no abdominal tenderness.  Musculoskeletal:        General: No swelling or tenderness.     Cervical back: Neck supple. No tenderness.  Lymphadenopathy:     Cervical: No cervical adenopathy.  Skin:    Findings: No erythema or rash.  Neurological:     Mental Status: He is alert.  Psychiatric:        Mood and Affect: Mood normal.        Behavior: Behavior normal.     BP 122/70   Pulse 83   Temp 98.2 F (36.8 C) (Oral)   Resp 16   Ht '5\' 8"'  (1.727 m)   Wt 233 lb 6.4 oz (105.9 kg)   SpO2 98%   BMI 35.49 kg/m  Wt Readings from Last 3 Encounters:  10/14/20 233 lb 6.4 oz (105.9 kg)  08/25/20 239 lb (108.4 kg)  08/07/20 250 lb (113.4 kg)     Lab Results  Component Value Date   WBC 9.0 06/16/2020   HGB 15.4 06/16/2020   HCT 45.9 06/16/2020   PLT 243.0 06/16/2020   GLUCOSE 97 10/14/2020   CHOL 132 10/14/2020   TRIG 184.0 (H) 10/14/2020    HDL 53.90 10/14/2020   LDLDIRECT 101.0 08/03/2018   LDLCALC 41 10/14/2020   ALT 18 10/14/2020   AST 17 10/14/2020   NA 141 10/14/2020   K 4.0 10/14/2020   CL 104 10/14/2020   CREATININE 1.34 10/14/2020   BUN 14 10/14/2020   CO2 28 10/14/2020   TSH 4.29 06/16/2020   PSA 0.00 (L) 06/16/2020   HGBA1C 5.3 10/14/2020    DG PAIN CLINIC C-ARM 1-60 MIN NO REPORT  Result Date: 08/07/2020 Fluoro was used, but no Radiologist interpretation will be provided. Please refer to "NOTES" tab for provider progress note.      Assessment & Plan:   Problem List Items Addressed This Visit    Barrett's esophagus    No upper symptoms.  On prilosec.  Stable. Has been followed by GI.       Gout (Chronic)    Continues on allopurinol.  Feels stable.  Follow.       History of malignant melanoma    Followed by dermatology.        History of prostate cancer    S/p prostatectomy.        Hypercholesterolemia    Off cholesterol medication.  Low cholesterol diet and exercise.  Follow lipid panel.       Relevant Orders   Hepatic function panel (Completed)   Lipid panel (Completed)   Hyperglycemia    Low carb diet and exercise.  Follow met b and a1c.       Hypertension - Primary    Blood pressure better.  Continue hydralazine, avapro and amlodipine.  Follow pressures.  Follow metabolic panel.       Relevant Orders   Basic metabolic panel (Completed)   Hypothyroid    On thyroid replacement.  Follow tsh.       Relevant Orders   Hemoglobin A1c (Completed)   Vitamin D deficiency    Check vitamin D  level with next labs.           Einar Pheasant, MD

## 2020-10-15 ENCOUNTER — Encounter: Payer: Self-pay | Admitting: Internal Medicine

## 2020-10-16 NOTE — Telephone Encounter (Signed)
LMTCB to see how patient was feeling & triage. Daughter sending messages is not on DPR & she is not listed in patient's chart.

## 2020-10-16 NOTE — Telephone Encounter (Signed)
If he is having severe pain, he needs to be seen today = may need scan, etc.  ([facility]) - this is an acute change - I feel needs to be seen today.

## 2020-10-16 NOTE — Telephone Encounter (Signed)
If he is having severe pain and persistent, he does need to be evaluated.  I would recommend evaluation today - may need scanning to determine kidney stone vs other etiology.

## 2020-10-18 ENCOUNTER — Encounter: Payer: Self-pay | Admitting: Internal Medicine

## 2020-10-18 ENCOUNTER — Other Ambulatory Visit: Payer: Self-pay

## 2020-10-18 ENCOUNTER — Emergency Department: Payer: Medicare Other

## 2020-10-18 ENCOUNTER — Emergency Department
Admission: EM | Admit: 2020-10-18 | Discharge: 2020-10-18 | Disposition: A | Discharge status: Home / Self Care | Payer: Medicare Other | Attending: Emergency Medicine | Admitting: Emergency Medicine

## 2020-10-18 ENCOUNTER — Encounter: Payer: Self-pay | Admitting: *Deleted

## 2020-10-18 DIAGNOSIS — Z85820 Personal history of malignant melanoma of skin: Secondary | ICD-10-CM | POA: Insufficient documentation

## 2020-10-18 DIAGNOSIS — Z8546 Personal history of malignant neoplasm of prostate: Secondary | ICD-10-CM | POA: Diagnosis not present

## 2020-10-18 DIAGNOSIS — I700 Atherosclerosis of aorta: Secondary | ICD-10-CM | POA: Diagnosis not present

## 2020-10-18 DIAGNOSIS — R1031 Right lower quadrant pain: Secondary | ICD-10-CM | POA: Diagnosis present

## 2020-10-18 DIAGNOSIS — E039 Hypothyroidism, unspecified: Secondary | ICD-10-CM | POA: Insufficient documentation

## 2020-10-18 DIAGNOSIS — Z79899 Other long term (current) drug therapy: Secondary | ICD-10-CM | POA: Insufficient documentation

## 2020-10-18 DIAGNOSIS — N132 Hydronephrosis with renal and ureteral calculous obstruction: Secondary | ICD-10-CM | POA: Insufficient documentation

## 2020-10-18 DIAGNOSIS — R109 Unspecified abdominal pain: Secondary | ICD-10-CM | POA: Diagnosis not present

## 2020-10-18 DIAGNOSIS — I10 Essential (primary) hypertension: Secondary | ICD-10-CM | POA: Diagnosis not present

## 2020-10-18 DIAGNOSIS — Z743 Need for continuous supervision: Secondary | ICD-10-CM | POA: Diagnosis not present

## 2020-10-18 DIAGNOSIS — N201 Calculus of ureter: Secondary | ICD-10-CM

## 2020-10-18 DIAGNOSIS — Z7982 Long term (current) use of aspirin: Secondary | ICD-10-CM | POA: Insufficient documentation

## 2020-10-18 DIAGNOSIS — Z85828 Personal history of other malignant neoplasm of skin: Secondary | ICD-10-CM | POA: Diagnosis not present

## 2020-10-18 DIAGNOSIS — K429 Umbilical hernia without obstruction or gangrene: Secondary | ICD-10-CM | POA: Diagnosis not present

## 2020-10-18 DIAGNOSIS — M47819 Spondylosis without myelopathy or radiculopathy, site unspecified: Secondary | ICD-10-CM | POA: Diagnosis not present

## 2020-10-18 LAB — CBC
HCT: 45.3 % (ref 39.0–52.0)
Hemoglobin: 15.2 g/dL (ref 13.0–17.0)
MCH: 31.1 pg (ref 26.0–34.0)
MCHC: 33.6 g/dL (ref 30.0–36.0)
MCV: 92.8 fL (ref 80.0–100.0)
Platelets: 250 10*3/uL (ref 150–400)
RBC: 4.88 MIL/uL (ref 4.22–5.81)
RDW: 13.1 % (ref 11.5–15.5)
WBC: 11.9 10*3/uL — ABNORMAL HIGH (ref 4.0–10.5)
nRBC: 0 % (ref 0.0–0.2)

## 2020-10-18 LAB — COMPREHENSIVE METABOLIC PANEL
ALT: 13 U/L (ref 0–44)
AST: 16 U/L (ref 15–41)
Albumin: 4.1 g/dL (ref 3.5–5.0)
Alkaline Phosphatase: 60 U/L (ref 38–126)
Anion gap: 10 (ref 5–15)
BUN: 18 mg/dL (ref 8–23)
CO2: 23 mmol/L (ref 22–32)
Calcium: 9.5 mg/dL (ref 8.9–10.3)
Chloride: 104 mmol/L (ref 98–111)
Creatinine, Ser: 1.77 mg/dL — ABNORMAL HIGH (ref 0.61–1.24)
GFR, Estimated: 38 mL/min — ABNORMAL LOW (ref >60)
Glucose, Bld: 112 mg/dL — ABNORMAL HIGH (ref 70–99)
Potassium: 3.8 mmol/L (ref 3.5–5.1)
Sodium: 137 mmol/L (ref 135–145)
Total Bilirubin: 0.9 mg/dL (ref 0.3–1.2)
Total Protein: 7.7 g/dL (ref 6.5–8.1)

## 2020-10-18 LAB — URINALYSIS, COMPLETE (UACMP) WITH MICROSCOPIC
Bacteria, UA: NONE SEEN
Bilirubin Urine: NEGATIVE
Glucose, UA: NEGATIVE mg/dL
Ketones, ur: 5 mg/dL — AB
Leukocytes,Ua: NEGATIVE
Nitrite: NEGATIVE
Protein, ur: 100 mg/dL — AB
Specific Gravity, Urine: 1.03 (ref 1.005–1.030)
pH: 5 (ref 5.0–8.0)

## 2020-10-18 LAB — LIPASE, BLOOD: Lipase: 27 U/L (ref 11–51)

## 2020-10-18 MED ORDER — NAPROXEN 500 MG PO TABS
500.0000 mg | ORAL_TABLET | Freq: Once | ORAL | Status: AC
  Administered 2020-10-18: 500 mg via ORAL
  Filled 2020-10-18: qty 1

## 2020-10-18 MED ORDER — TAMSULOSIN HCL 0.4 MG PO CAPS: 0.4000 mg | ORAL_CAPSULE | Freq: Every day | ORAL | 0 refills | Status: AC

## 2020-10-18 MED ORDER — ONDANSETRON HCL 4 MG/2ML IJ SOLN
4.0000 mg | Freq: Once | INTRAMUSCULAR | Status: AC
  Administered 2020-10-18: 4 mg via INTRAVENOUS
  Filled 2020-10-18: qty 2

## 2020-10-18 MED ORDER — ONDANSETRON HCL 4 MG PO TABS: 4.0000 mg | ORAL_TABLET | Freq: Four times a day (QID) | ORAL | 1 refills | Status: DC | PRN

## 2020-10-18 MED ORDER — SODIUM CHLORIDE 0.9 % IV BOLUS
1000.0000 mL | Freq: Once | INTRAVENOUS | Status: AC
  Administered 2020-10-18: 1000 mL via INTRAVENOUS

## 2020-10-18 MED ORDER — OXYCODONE-ACETAMINOPHEN 5-325 MG PO TABS
1.0000 | ORAL_TABLET | Freq: Once | ORAL | Status: AC
  Administered 2020-10-18: 1 via ORAL
  Filled 2020-10-18: qty 1

## 2020-10-18 MED ORDER — TAMSULOSIN HCL 0.4 MG PO CAPS
0.4000 mg | ORAL_CAPSULE | ORAL | Status: AC
  Administered 2020-10-18: 0.4 mg via ORAL
  Filled 2020-10-18: qty 1

## 2020-10-18 MED ORDER — IOHEXOL 300 MG/ML  SOLN: 100.0000 mL | Freq: Once | INTRAMUSCULAR | Status: DC | PRN

## 2020-10-18 MED ORDER — OXYCODONE-ACETAMINOPHEN 5-325 MG PO TABS: 1.0000 | ORAL_TABLET | Freq: Four times a day (QID) | ORAL | 0 refills | Status: AC | PRN

## 2020-10-18 MED ORDER — MORPHINE SULFATE (PF) 4 MG/ML IV SOLN
4.0000 mg | Freq: Once | INTRAVENOUS | Status: AC
  Administered 2020-10-18: 4 mg via INTRAVENOUS
  Filled 2020-10-18: qty 1

## 2020-10-18 MED ORDER — IOHEXOL 300 MG/ML  SOLN
75.0000 mL | Freq: Once | INTRAMUSCULAR | Status: AC | PRN
  Administered 2020-10-18: 75 mL via INTRAVENOUS

## 2020-10-18 NOTE — ED Provider Notes (Signed)
[facility] Emergency Department Provider Note  ____________________________________________  Time seen: Approximately 3:40 PM  I have reviewed the triage vital signs and the nursing notes.   HISTORY  Chief Complaint Abdominal Pain    HPI Hector [name] is a 86 y.o. male with a history of prostate cancer, diverticulitis, hypertension who comes ED complaining of right-sided abdominal pain for the last 5 days, constant, waxing and waning, no aggravating or alleviating factors, worsening.  Constipated for last 5 days as well.  Has nausea but no vomiting.  Decreased oral intake.  No chest pain shortness of breath or fever.  Denies dysuria or urinary retention      Past Medical History:  Diagnosis Date  . Acute postoperative pain 03/21/2018  . Anemia   . Arthritis   . Barrett esophagus   . Cancer ([place])    prostate,skin  . Chicken pox   . Diverticulitis   . Dysrhythmia   . GERD (gastroesophageal reflux disease)   . Hyperlipidemia   . Hypertension   . Hypothyroidism   . Melanoma ([place])    Malignant resection  . Sleep apnea   . Ulcer      Patient Active Problem List   Diagnosis Date Noted  . History of malignant melanoma 10/18/2020  . Uncomplicated opioid dependence ([place]) 08/24/2020  . Chronic low back pain (Bilateral) w/o sciatica 08/07/2020  . Severe obesity (BMI 35.0-39.9) with comorbidity ([place]) 08/04/2020  . Osteopenia 05/07/2020  . Hyperglycemia 04/20/2020  . Irregular heartbeat 02/29/2020  . Knee pain 02/29/2020  . Right lower quadrant pain 01/27/2020  . Gout 12/19/2019  . DDD (degenerative disc disease), lumbosacral 05/24/2019  . Chronic right shoulder pain 05/15/2018  . Hypothyroid 04/02/2018  . B12 deficiency 04/02/2018  . Asymptomatic PVCs 03/21/2018  . Spondylosis without myelopathy or radiculopathy, lumbosacral region 10/27/2017  . Lumbar facet syndrome (Bilateral) (L>R) 07/14/2017  . Chronic lower extremity pain (Secondary Area of  Pain) (Left) 07/14/2017  . Chronic lower extremity radicular pain (L5) (Left) 07/14/2017  . Lumbar facet osteoarthritis (Bilateral) 07/14/2017  . Osteoarthritis of lumbar spine 07/14/2017  . Lumbar spondylosis 07/14/2017  . Chronic musculoskeletal pain 06/29/2017  . Vitamin D deficiency 06/29/2017  . L5-S1 bilateral pars defect with spondylolisthesis 06/28/2017  . Lumbosacral Grade 1 Anterolisthesis of L5 over S1 06/28/2017  . Lumbar facet arthropathy (Bilateral) 06/28/2017  . Lumbar foraminal stenosis (L5-S1) (Bilateral) (L>R) 06/28/2017  . Chronic Lumbar radiculitis (L5) (Left) 06/28/2017  . Disorder of skeletal system 06/28/2017  . Problems influencing health status 06/28/2017  . Opiate use 06/28/2017  . Chronic low back pain (1ry area of Pain) (Bilateral) (L>R) w/ sciatica (Left) 06/13/2017  . Chronic pain syndrome 06/13/2017  . Other long term (current) drug therapy 06/13/2017  . Other reduced mobility 06/13/2017  . Other specified health status 06/13/2017  . Polyarthritis, unspecified (Secondary Area of Pain) 06/13/2017  . Shoulder pain, left 08/29/2016  . Chronic hip pain (Left) 08/29/2016  . Pharmacologic therapy 02/22/2015  . BMI 37.0-37.9, adult 02/23/2014  . Barrett's esophagus 12/07/2012  . History of colonic polyps 10/01/2012  . Sleep apnea 08/13/2012  . History of prostate cancer 08/13/2012  . Hypertension 08/10/2012  . Hypercholesterolemia 08/10/2012     Past Surgical History:  Procedure Laterality Date  . CARDIAC CATHETERIZATION    . Cataract Surgery Right 02/13/14  . COLON SURGERY  2006-2008-2011   polyps removed  . COLONOSCOPY W/ POLYPECTOMY    . COLONOSCOPY WITH PROPOFOL N/A 02/15/2018   Procedure: COLONOSCOPY  WITH PROPOFOL;  Surgeon: Manya Silvas, MD;  Location: [facility] ENDOSCOPY;  Service: Endoscopy;  Laterality: N/A;  . COLONOSCOPY WITH PROPOFOL N/A 03/24/2020   Procedure: COLONOSCOPY WITH PROPOFOL;  Surgeon: Lesly Rubenstein, MD;  Location: [facility]  ENDOSCOPY;  Service: Endoscopy;  Laterality: N/A;  . cystocopy  2003  . ESOPHAGOGASTRODUODENOSCOPY (EGD) WITH PROPOFOL N/A 08/04/2016   Procedure: ESOPHAGOGASTRODUODENOSCOPY (EGD) WITH PROPOFOL;  Surgeon: Manya Silvas, MD;  Location: [facility] ENDOSCOPY;  Service: Endoscopy;  Laterality: N/A;  . ESOPHAGOGASTRODUODENOSCOPY (EGD) WITH PROPOFOL N/A 03/24/2020   Procedure: ESOPHAGOGASTRODUODENOSCOPY (EGD) WITH PROPOFOL;  Surgeon: Lesly Rubenstein, MD;  Location: [facility] ENDOSCOPY;  Service: Endoscopy;  Laterality: N/A;  . HEMORRHOID SURGERY    . PROSTATE SURGERY    . sleep study       Prior to Admission medications   Medication Sig Start Date End Date Taking? Authorizing Provider  ondansetron (ZOFRAN) 4 MG tablet Take 1 tablet (4 mg total) by mouth every 6 (six) hours as needed for nausea or vomiting. 10/18/20  Yes Carrie Mew, MD  oxyCODONE-acetaminophen (PERCOCET) 5-325 MG tablet Take 1 tablet by mouth every 6 (six) hours as needed for up to 3 days for severe pain. 10/18/20 10/21/20 Yes Carrie Mew, MD  tamsulosin (FLOMAX) 0.4 MG CAPS capsule Take 1 capsule (0.4 mg total) by mouth daily for 10 days. Discontinue after symptoms improve 10/18/20 10/28/20 Yes Carrie Mew, MD  acetaminophen (TYLENOL) 500 MG tablet Take 500 mg by mouth every 6 (six) hours as needed.    [provider]  allopurinol (ZYLOPRIM) 100 MG tablet TAKE 1 TABLET BY MOUTH DAILY 08/04/20   Einar Pheasant, MD  amLODipine (NORVASC) 10 MG tablet TAKE 1 TABLET BY MOUTH DAILY 08/11/20   Einar Pheasant, MD  aspirin 81 MG tablet Take 81 mg by mouth daily.    [provider]  CALCIUM 600 1500 (600 Ca) MG TABS tablet TAKE 2 TABLETS BY MOUTH EVERY DAY WITH BREAKFAST 09/26/20   Einar Pheasant, MD  Calcium Carb-Cholecalciferol (CALCIUM PLUS D3 ABSORBABLE) [phone] MG-UNIT CAPS Take 1 capsule by mouth daily with breakfast. 05/16/19 05/15/20  Milinda Pointer, MD  calcium carbonate (CALCIUM 600) 600 MG TABS tablet  Take 2 tablets (1,200 mg total) by mouth daily with breakfast. 05/10/20 08/08/20  Milinda Pointer, MD  fluticasone (FLONASE) 50 MCG/ACT nasal spray TWO PUFFS IN EACH NOSTRIL ONCE A DAY 06/04/15   Einar Pheasant, MD  hydrALAZINE (APRESOLINE) 25 MG tablet TAKE ONE TABLET BY MOUTH THREE TIMES A DAY 08/28/20   Einar Pheasant, MD  hyoscyamine (LEVSIN, ANASPAZ) 0.125 MG tablet Take 0.125 mg by mouth as needed.    [provider]  irbesartan (AVAPRO) 300 MG tablet TAKE 1 TABLET BY MOUTH DAILY 05/22/20   Einar Pheasant, MD  L-Lysine 500 MG TABS Take by mouth daily.    [provider]  levothyroxine (SYNTHROID) 100 MCG tablet TAKE 1 TABLET BY MOUTH DAILY WITH BREAKFAST 08/28/20   Einar Pheasant, MD  omeprazole (PRILOSEC) 20 MG capsule TAKE ONE CAPSULE BY MOUTH TWICE A DAY 08/28/20   Einar Pheasant, MD  rosuvastatin (CRESTOR) 5 MG tablet TAKE 1 TABLET BY MOUTH DAILY 08/04/20   Einar Pheasant, MD  Sennosides (LAX-PILLS PO) Take 2 mg by mouth.    [provider]  traMADol (ULTRAM) 50 MG tablet Take 1 tablet (50 mg total) by mouth every 8 (eight) hours as needed for severe pain. 08/08/20 11/06/20  Milinda Pointer, MD  valACYclovir (VALTREX) 1000 MG tablet TAKE 1 TABLET BY  MOUTH DAILY 09/12/20   Einar Pheasant, MD     Allergies Tape   Family History  Problem Relation Age of Onset  . Liver disease Mother   . Heart disease Father   . Kidney disease Father     Social History Social History   Tobacco Use  . Smoking status: Never Smoker  . Smokeless tobacco: Never Used  Vaping Use  . Vaping Use: Never used  Substance Use Topics  . Alcohol use: No    Alcohol/week: 0.0 standard drinks  . Drug use: No    Review of Systems  Constitutional:   No fever or chills.  ENT:   No sore throat. No rhinorrhea. Cardiovascular:   No chest pain or syncope. Respiratory:   No dyspnea or cough. Gastrointestinal: Positive as above for abdominal pain.  No vomiting Musculoskeletal:    Negative for focal pain or swelling All other systems reviewed and are negative except as documented above in ROS and HPI.  ____________________________________________   PHYSICAL EXAM:  VITAL SIGNS: ED Triage Vitals  Enc Vitals Group     BP 10/18/20 1017 (!) 146/90     Pulse Rate 10/18/20 1017 68     Resp 10/18/20 1017 18     Temp 10/18/20 1017 98.6 F (37 C)     Temp Source 10/18/20 1017 Oral     SpO2 10/18/20 1017 97 %     Weight 10/18/20 1020 230 lb (104.3 kg)     Height 10/18/20 1020 5\' 8"  (1.727 m)     Head Circumference --      Peak Flow --      Pain Score 10/18/20 1020 10     Pain Loc --      Pain Edu? --      Excl. in [place]? --     Vital signs reviewed, nursing assessments reviewed.   Constitutional:   Alert and oriented. Non-toxic appearance. Eyes:   Conjunctivae are normal. EOMI. PERRL. ENT      Head:   Normocephalic and atraumatic.      Nose:   Wearing a mask.      Mouth/Throat:   Wearing a mask.      Neck:   No meningismus. Full ROM. Hematological/Lymphatic/Immunilogical:   No cervical lymphadenopathy. Cardiovascular:   RRR. Symmetric bilateral radial and DP pulses.  No murmurs. Cap refill less than 2 seconds. Respiratory:   Normal respiratory effort without tachypnea/retractions. Breath sounds are clear and equal bilaterally. No wheezes/rales/rhonchi. Gastrointestinal:   Soft with diffuse abdominal tenderness, worse on the right side. Non distended. There is no CVA tenderness.  No rebound, rigidity, or guarding.  Musculoskeletal:   Normal range of motion in all extremities. No joint effusions.  No lower extremity tenderness.  No edema. Neurologic:   Normal speech and language.  Motor grossly intact. No acute focal neurologic deficits are appreciated.  Skin:    Skin is warm, dry and intact. No rash noted.  No petechiae, purpura, or bullae.  ____________________________________________    LABS (pertinent positives/negatives) (all labs ordered are listed,  but only abnormal results are displayed) Labs Reviewed  COMPREHENSIVE METABOLIC PANEL - Abnormal; Notable for the following components:      Result Value   Glucose, Bld 112 (*)    Creatinine, Ser 1.77 (*)    GFR, Estimated 38 (*)    All other components within normal limits  CBC - Abnormal; Notable for the following components:   WBC 11.9 (*)    All other  components within normal limits  URINALYSIS, COMPLETE (UACMP) WITH MICROSCOPIC - Abnormal; Notable for the following components:   Color, Urine AMBER (*)    APPearance CLEAR (*)    Hgb urine dipstick SMALL (*)    Ketones, ur 5 (*)    Protein, ur 100 (*)    All other components within normal limits  LIPASE, BLOOD   ____________________________________________   EKG  Interpreted by me Sinus rhythm rate of 62, left axis, normal intervals.  Poor R wave progression.  Normal ST segments and T waves.  2 PVCs on the strip.  ____________________________________________    RADIOLOGY  CT ABDOMEN PELVIS W CONTRAST  Result Date: 10/18/2020 CLINICAL DATA:  Acute abdomen pain nonlocalized. EXAM: CT ABDOMEN AND PELVIS WITH CONTRAST TECHNIQUE: Multidetector CT imaging of the abdomen and pelvis was performed using the standard protocol following bolus administration of intravenous contrast. CONTRAST:  73mL OMNIPAQUE IOHEXOL 300 MG/ML  SOLN COMPARISON:  October 03, 2006 FINDINGS: Lower chest: No acute abnormality. Hepatobiliary: No focal liver abnormality is seen. No gallstones, gallbladder wall thickening, or biliary dilatation. Pancreas: Unremarkable. No pancreatic ductal dilatation or surrounding inflammatory changes. Spleen: Normal in size without focal abnormality. Adrenals/Urinary Tract: There is right hydroureteronephrosis due to obstruction by 4 mm stone in the distal right ureter just prior to the right ureteral vesicular junction. The left kidney is normal. There is no left hydronephrosis. The bladder is decompressed without gross  abnormality. Stomach/Bowel: Stomach is within normal limits. The appendix is not seen but no inflammation is noted around cecum. No evidence of bowel wall thickening, distention, or inflammatory changes. Vascular/Lymphatic: Aortic atherosclerosis. No enlarged abdominal or pelvic lymph nodes. Reproductive: Findings suggestive of prior prostate surgery unchanged. Other: Small umbilical herniation of mesenteric fat is identified. There is no ascites. Musculoskeletal: Degenerative joint changes of the spine are noted. IMPRESSION: Right hydroureteronephrosis due to obstruction by 4 mm stone in the distal right ureter just prior to the right ureteral vesicular junction. Aortic Atherosclerosis (ICD10-I70.0). Electronically Signed   By: Abelardo Diesel M.D.   On: 10/18/2020 14:22    ____________________________________________   PROCEDURES Procedures  ____________________________________________  DIFFERENTIAL DIAGNOSIS   Bowel obstruction, diverticulitis, biliary disease, kidney stone, pancreatitis  CLINICAL IMPRESSION / ASSESSMENT AND PLAN / ED COURSE  Medications ordered in the ED: Medications  naproxen (NAPROSYN) tablet 500 mg (has no administration in time range)  oxyCODONE-acetaminophen (PERCOCET/ROXICET) 5-325 MG per tablet 1 tablet (has no administration in time range)  sodium chloride 0.9 % bolus 1,000 mL (0 mLs Intravenous Stopped 10/18/20 1501)  ondansetron (ZOFRAN) injection 4 mg (4 mg Intravenous Given 10/18/20 1219)  morphine 4 MG/ML injection 4 mg (4 mg Intravenous Given 10/18/20 1219)  iohexol (OMNIPAQUE) 300 MG/ML solution 75 mL (75 mLs Intravenous Contrast Given 10/18/20 1324)  tamsulosin (FLOMAX) capsule 0.4 mg (0.4 mg Oral Given 10/18/20 1525)    Pertinent labs & imaging results that were available during my care of the patient were reviewed by me and considered in my medical decision making (see chart for details).  VISHAAL STROLLO was evaluated in Emergency Department on 10/18/2020  for the symptoms described in the history of present illness. He was evaluated in the context of the global COVID-19 pandemic, which necessitated consideration that the patient might be at risk for infection with the SARS-CoV-2 virus that causes COVID-19. Institutional protocols and algorithms that pertain to the evaluation of patients at risk for COVID-19 are in a state of rapid change based on information released by regulatory  bodies including the CDC and federal and state organizations. These policies and algorithms were followed during the patient's care in the ED.   Patient presents with severe abdominal pain, given Zofran 4 mg IV for milligrams IV for additional pain relief and symptom control.  IV fluids for hydration.  CT scan obtained which does show a 4 mm stone at the right UVJ causing some hydronephrosis.  Urinalysis is negative for infection, creatinine is roughly at baseline.  Vital signs unremarkable.  Patient reassessed and pain control is improved, he feels comfortable with going home with continued pain control, recommended he follow-up with urology in 2 days if symptoms have not dramatically improved, return precautions for worsening symptoms including fever and vomiting.      ____________________________________________   FINAL CLINICAL IMPRESSION(S) / ED DIAGNOSES    Final diagnoses:  Ureterolithiasis     ED Discharge Orders         Ordered    oxyCODONE-acetaminophen (PERCOCET) 5-325 MG tablet  Every 6 hours PRN        10/18/20 1539    ondansetron (ZOFRAN) 4 MG tablet  Every 6 hours PRN        10/18/20 1539    tamsulosin (FLOMAX) 0.4 MG CAPS capsule  Daily        10/18/20 1539          Portions of this note were generated with dragon dictation software. Dictation errors may occur despite best attempts at proofreading.   Carrie Mew, MD 10/18/20 [phone]

## 2020-10-18 NOTE — Assessment & Plan Note (Signed)
Check vitamin D level with next labs.  ?

## 2020-10-18 NOTE — Assessment & Plan Note (Signed)
Followed by dermatology

## 2020-10-18 NOTE — Assessment & Plan Note (Signed)
On thyroid replacement.  Follow tsh.  

## 2020-10-18 NOTE — ED Triage Notes (Signed)
Pt in via EMS from home with c/o abd pain, sharp and constant since Tuesday but has increased today. Pt with tenderness to RLQ. Pt with no BM since Tuesday. Pt with hx of a-fib and diverticulitis. 98.3 temp, FSBS 109, HR 65, 97% RA, 147/65

## 2020-10-18 NOTE — Assessment & Plan Note (Signed)
Continues on allopurinol.  Feels stable.  Follow.

## 2020-10-18 NOTE — Assessment & Plan Note (Addendum)
No upper symptoms.  On prilosec.  Stable. Has been followed by GI.  

## 2020-10-18 NOTE — ED Notes (Signed)
Patient sleeping at this time. No signs of distress. Will continue to monitor. 

## 2020-10-18 NOTE — Assessment & Plan Note (Signed)
S/p prostatectomy

## 2020-10-18 NOTE — Assessment & Plan Note (Signed)
Low carb diet and exercise.  Follow met b and a1c.  

## 2020-10-18 NOTE — Assessment & Plan Note (Signed)
Off cholesterol medication.  Low cholesterol diet and exercise.  Follow lipid panel.

## 2020-10-18 NOTE — Assessment & Plan Note (Signed)
Blood pressure better.  Continue hydralazine, avapro and amlodipine.  Follow pressures.  Follow metabolic panel.

## 2020-10-23 ENCOUNTER — Encounter: Payer: Self-pay | Admitting: Urology

## 2020-10-23 ENCOUNTER — Other Ambulatory Visit: Payer: Self-pay

## 2020-10-23 ENCOUNTER — Ambulatory Visit: Payer: Medicare Other | Admitting: Urology

## 2020-10-23 VITALS — BP 154/91 | HR 94 | Ht 68.0 in | Wt 230.0 lb

## 2020-10-23 DIAGNOSIS — R3915 Urgency of urination: Secondary | ICD-10-CM

## 2020-10-23 DIAGNOSIS — N3289 Other specified disorders of bladder: Secondary | ICD-10-CM | POA: Diagnosis not present

## 2020-10-23 DIAGNOSIS — N132 Hydronephrosis with renal and ureteral calculous obstruction: Secondary | ICD-10-CM

## 2020-10-23 DIAGNOSIS — N201 Calculus of ureter: Secondary | ICD-10-CM

## 2020-10-23 DIAGNOSIS — N200 Calculus of kidney: Secondary | ICD-10-CM | POA: Diagnosis not present

## 2020-10-23 MED ORDER — TROSPIUM CHLORIDE 20 MG PO TABS: 20.0000 mg | ORAL_TABLET | Freq: Two times a day (BID) | ORAL | 0 refills | Status: DC

## 2020-10-23 NOTE — Progress Notes (Signed)
10/23/2020 1:49 PM   AMMON MUSCATELLO September 28, 1934 [id number]  Referring provider: Einar Pheasant, [address] [place],  [place] [address]  Chief Complaint  Patient presents with  . Nephrolithiasis    HPI: Hector [name] is an 86 y.o. male who presents for ED follow-up of renal colic.   Presented to [facility] ED 10/18/2020 with a 5-day history of intermittent right abdominal pain  No identifiable precipitating, aggravating or alleviating factors  + Nausea without vomiting  Denies fever, chills  No bothersome LUTS  Stone protocol CT with a 4 mm right distal ureteral calculus with mild-moderate upstream hydroureter/hydronephrosis  Prior stone ~ 4 years ago which he passed  Minimal pain but his most bothersome symptoms are frequency, urgency and bladder spasm after voiding  Urinalysis in ED unremarkable  History prostate cancer status post radical prostatectomy ~ 20 years ago   PMH: Past Medical History:  Diagnosis Date  . Acute postoperative pain 03/21/2018  . Anemia   . Arthritis   . Barrett esophagus   . Cancer ([place])    prostate,skin  . Chicken pox   . Diverticulitis   . Dysrhythmia   . GERD (gastroesophageal reflux disease)   . Hyperlipidemia   . Hypertension   . Hypothyroidism   . Melanoma ([place])    Malignant resection  . Sleep apnea   . Ulcer     Surgical History: Past Surgical History:  Procedure Laterality Date  . CARDIAC CATHETERIZATION    . Cataract Surgery Right 02/13/14  . COLON SURGERY  2006-2008-2011   polyps removed  . COLONOSCOPY W/ POLYPECTOMY    . COLONOSCOPY WITH PROPOFOL N/A 02/15/2018   Procedure: COLONOSCOPY WITH PROPOFOL;  Surgeon: Manya Silvas, MD;  Location: [facility] ENDOSCOPY;  Service: Endoscopy;  Laterality: N/A;  . COLONOSCOPY WITH PROPOFOL N/A 03/24/2020   Procedure: COLONOSCOPY WITH PROPOFOL;  Surgeon: Lesly Rubenstein, MD;  Location: [facility] ENDOSCOPY;  Service: Endoscopy;  Laterality: N/A;  . cystocopy   2003  . ESOPHAGOGASTRODUODENOSCOPY (EGD) WITH PROPOFOL N/A 08/04/2016   Procedure: ESOPHAGOGASTRODUODENOSCOPY (EGD) WITH PROPOFOL;  Surgeon: Manya Silvas, MD;  Location: [facility] ENDOSCOPY;  Service: Endoscopy;  Laterality: N/A;  . ESOPHAGOGASTRODUODENOSCOPY (EGD) WITH PROPOFOL N/A 03/24/2020   Procedure: ESOPHAGOGASTRODUODENOSCOPY (EGD) WITH PROPOFOL;  Surgeon: Lesly Rubenstein, MD;  Location: [facility] ENDOSCOPY;  Service: Endoscopy;  Laterality: N/A;  . HEMORRHOID SURGERY    . PROSTATE SURGERY    . sleep study      Home Medications:  Allergies as of 10/23/2020      Reactions   Tape Other (See Comments)   Surgical      Medication List       Accurate as of October 23, 2020  1:49 PM. If you have any questions, ask your nurse or doctor.        STOP taking these medications   Calcium Plus D3 Absorbable [phone] MG-UNIT Caps Stopped by: Abbie Sons, MD     TAKE these medications   acetaminophen 500 MG tablet Commonly known as: TYLENOL Take 500 mg by mouth every 6 (six) hours as needed.   allopurinol 100 MG tablet Commonly known as: ZYLOPRIM TAKE 1 TABLET BY MOUTH DAILY   amLODipine 10 MG tablet Commonly known as: NORVASC TAKE 1 TABLET BY MOUTH DAILY   aspirin 81 MG tablet Take 81 mg by mouth daily.   calcium carbonate 600 MG Tabs tablet Commonly known as: Calcium 600 Take 2 tablets (1,200 mg total) by mouth daily with  breakfast.   Calcium 600 1500 (600 Ca) MG Tabs tablet Generic drug: calcium carbonate TAKE 2 TABLETS BY MOUTH EVERY DAY WITH BREAKFAST   fluticasone 50 MCG/ACT nasal spray Commonly known as: FLONASE TWO PUFFS IN EACH NOSTRIL ONCE A DAY   hydrALAZINE 25 MG tablet Commonly known as: APRESOLINE TAKE ONE TABLET BY MOUTH THREE TIMES A DAY   hyoscyamine 0.125 MG tablet Commonly known as: LEVSIN Take 0.125 mg by mouth as needed.   irbesartan 300 MG tablet Commonly known as: AVAPRO TAKE 1 TABLET BY MOUTH DAILY   L-Lysine 500 MG Tabs Take by mouth  daily.   LAX-PILLS PO Take 2 mg by mouth.   levothyroxine 100 MCG tablet Commonly known as: SYNTHROID TAKE 1 TABLET BY MOUTH DAILY WITH BREAKFAST   omeprazole 20 MG capsule Commonly known as: PRILOSEC TAKE ONE CAPSULE BY MOUTH TWICE A DAY   ondansetron 4 MG tablet Commonly known as: Zofran Take 1 tablet (4 mg total) by mouth every 6 (six) hours as needed for nausea or vomiting.   rosuvastatin 5 MG tablet Commonly known as: CRESTOR TAKE 1 TABLET BY MOUTH DAILY   tamsulosin 0.4 MG Caps capsule Commonly known as: FLOMAX Take 1 capsule (0.4 mg total) by mouth daily for 10 days. Discontinue after symptoms improve   traMADol 50 MG tablet Commonly known as: ULTRAM Take 1 tablet (50 mg total) by mouth every 8 (eight) hours as needed for severe pain.   valACYclovir 1000 MG tablet Commonly known as: VALTREX TAKE 1 TABLET BY MOUTH DAILY       Allergies:  Allergies  Allergen Reactions  . Tape Other (See Comments)    Surgical    Family History: Family History  Problem Relation Age of Onset  . Liver disease Mother   . Heart disease Father   . Kidney disease Father     Social History:  reports that he has never smoked. He has never used smokeless tobacco. He reports that he does not drink alcohol and does not use drugs.   Physical Exam: BP (!) 154/91   Pulse 94   Ht '5\' 8"'  (1.727 m)   Wt 230 lb (104.3 kg)   BMI 34.97 kg/m   Constitutional:  Alert and oriented, No acute distress. HEENT: [place] AT, moist mucus membranes.  Trachea midline, no masses. Cardiovascular: No clubbing, cyanosis, or edema. Respiratory: Normal respiratory effort, no increased work of breathing. Skin: No rashes, bruises or suspicious lesions. Psychiatric: Normal mood and affect.   Pertinent Imaging: CT images were personally reviewed and interpreted.  No renal calculi noted   Assessment & Plan:    1. Nephrolithiasis  4 mm right distal ureteral calculus  Bothersome storage elated voiding  symptoms and bladder spasm  Rx trospium 20 mg twice daily sent to pharmacy We discussed various treatment options for urolithiasis including observation with or without medical expulsive therapy, shockwave lithotripsy (SWL), ureteroscopy and laser lithotripsy with stent placement. We discussed that management is based on stone size, location, density, patient co-morbidities, and patient preference.  Stones <28m in size have a >80% spontaneous passage rate. Data surrounding the use of tamsulosin for medical expulsive therapy is controversial, but meta analyses suggests it is most efficacious for distal stones between 5-117min size. Possible side effects include dizziness/lightheadedness, and retrograde ejaculation. SWL has a lower stone free rate in a single procedure, but also a lower complication rate compared to ureteroscopy and avoids a stent and associated stent related symptoms. Possible complications include renal  hematoma, steinstrasse, and need for additional treatment. Ureteroscopy with laser lithotripsy and stent placement has a higher stone free rate than SWL in a single procedure, however increased complication rate including possible infection, ureteral injury, bleeding, and stent related morbidity. Common stent related symptoms include dysuria, urgency/frequency, and flank pain. After an extensive discussion of the risks and benefits of the above treatment options, the patient would like to proceed with MET  If he has persistent symptoms and desires treatment he was instructed to call back on Monday for scheduling   Abbie Sons, [place] [address] [place], [place] [address] [phone]

## 2020-10-23 NOTE — H&P (View-Only) (Signed)
10/23/2020 1:49 PM   Hector [name] [birth year] [id number]  Referring provider: Einar Pheasant, [address] [place],  [place] [address]  Chief Complaint  Patient presents with  . Nephrolithiasis    HPI: Hector [name] is an 86 y.o. male who presents for ED follow-up of renal colic.   Presented to [facility] ED 10/18/2020 with a 5-day history of intermittent right abdominal pain  No identifiable precipitating, aggravating or alleviating factors  + Nausea without vomiting  Denies fever, chills  No bothersome LUTS  Stone protocol CT with a 4 mm right distal ureteral calculus with mild-moderate upstream hydroureter/hydronephrosis  Prior stone ~ 4 years ago which he passed  Minimal pain but his most bothersome symptoms are frequency, urgency and bladder spasm after voiding  Urinalysis in ED unremarkable  History prostate cancer status post radical prostatectomy ~ 20 years ago   PMH: Past Medical History:  Diagnosis Date  . Acute postoperative pain 03/21/2018  . Anemia   . Arthritis   . Barrett esophagus   . Cancer ([place])    prostate,skin  . Chicken pox   . Diverticulitis   . Dysrhythmia   . GERD (gastroesophageal reflux disease)   . Hyperlipidemia   . Hypertension   . Hypothyroidism   . Melanoma ([place])    Malignant resection  . Sleep apnea   . Ulcer     Surgical History: Past Surgical History:  Procedure Laterality Date  . CARDIAC CATHETERIZATION    . Cataract Surgery Right 02/13/14  . COLON SURGERY  2006-2008-2011   polyps removed  . COLONOSCOPY W/ POLYPECTOMY    . COLONOSCOPY WITH PROPOFOL N/A 02/15/2018   Procedure: COLONOSCOPY WITH PROPOFOL;  Surgeon: Manya Silvas, MD;  Location: [facility] ENDOSCOPY;  Service: Endoscopy;  Laterality: N/A;  . COLONOSCOPY WITH PROPOFOL N/A 03/24/2020   Procedure: COLONOSCOPY WITH PROPOFOL;  Surgeon: Lesly Rubenstein, MD;  Location: [facility] ENDOSCOPY;  Service: Endoscopy;  Laterality: N/A;  . cystocopy   2003  . ESOPHAGOGASTRODUODENOSCOPY (EGD) WITH PROPOFOL N/A 08/04/2016   Procedure: ESOPHAGOGASTRODUODENOSCOPY (EGD) WITH PROPOFOL;  Surgeon: Manya Silvas, MD;  Location: [facility] ENDOSCOPY;  Service: Endoscopy;  Laterality: N/A;  . ESOPHAGOGASTRODUODENOSCOPY (EGD) WITH PROPOFOL N/A 03/24/2020   Procedure: ESOPHAGOGASTRODUODENOSCOPY (EGD) WITH PROPOFOL;  Surgeon: Lesly Rubenstein, MD;  Location: [facility] ENDOSCOPY;  Service: Endoscopy;  Laterality: N/A;  . HEMORRHOID SURGERY    . PROSTATE SURGERY    . sleep study      Home Medications:  Allergies as of 10/23/2020      Reactions   Tape Other (See Comments)   Surgical      Medication List       Accurate as of October 23, 2020  1:49 PM. If you have any questions, ask your nurse or doctor.        STOP taking these medications   Calcium Plus D3 Absorbable [phone] MG-UNIT Caps Stopped by: Abbie Sons, MD     TAKE these medications   acetaminophen 500 MG tablet Commonly known as: TYLENOL Take 500 mg by mouth every 6 (six) hours as needed.   allopurinol 100 MG tablet Commonly known as: ZYLOPRIM TAKE 1 TABLET BY MOUTH DAILY   amLODipine 10 MG tablet Commonly known as: NORVASC TAKE 1 TABLET BY MOUTH DAILY   aspirin 81 MG tablet Take 81 mg by mouth daily.   calcium carbonate 600 MG Tabs tablet Commonly known as: Calcium 600 Take 2 tablets (1,200 mg total) by mouth daily with  breakfast.   Calcium 600 1500 (600 Ca) MG Tabs tablet Generic drug: calcium carbonate TAKE 2 TABLETS BY MOUTH EVERY DAY WITH BREAKFAST   fluticasone 50 MCG/ACT nasal spray Commonly known as: FLONASE TWO PUFFS IN EACH NOSTRIL ONCE A DAY   hydrALAZINE 25 MG tablet Commonly known as: APRESOLINE TAKE ONE TABLET BY MOUTH THREE TIMES A DAY   hyoscyamine 0.125 MG tablet Commonly known as: LEVSIN Take 0.125 mg by mouth as needed.   irbesartan 300 MG tablet Commonly known as: AVAPRO TAKE 1 TABLET BY MOUTH DAILY   L-Lysine 500 MG Tabs Take by mouth  daily.   LAX-PILLS PO Take 2 mg by mouth.   levothyroxine 100 MCG tablet Commonly known as: SYNTHROID TAKE 1 TABLET BY MOUTH DAILY WITH BREAKFAST   omeprazole 20 MG capsule Commonly known as: PRILOSEC TAKE ONE CAPSULE BY MOUTH TWICE A DAY   ondansetron 4 MG tablet Commonly known as: Zofran Take 1 tablet (4 mg total) by mouth every 6 (six) hours as needed for nausea or vomiting.   rosuvastatin 5 MG tablet Commonly known as: CRESTOR TAKE 1 TABLET BY MOUTH DAILY   tamsulosin 0.4 MG Caps capsule Commonly known as: FLOMAX Take 1 capsule (0.4 mg total) by mouth daily for 10 days. Discontinue after symptoms improve   traMADol 50 MG tablet Commonly known as: ULTRAM Take 1 tablet (50 mg total) by mouth every 8 (eight) hours as needed for severe pain.   valACYclovir 1000 MG tablet Commonly known as: VALTREX TAKE 1 TABLET BY MOUTH DAILY       Allergies:  Allergies  Allergen Reactions  . Tape Other (See Comments)    Surgical    Family History: Family History  Problem Relation Age of Onset  . Liver disease Mother   . Heart disease Father   . Kidney disease Father     Social History:  reports that he has never smoked. He has never used smokeless tobacco. He reports that he does not drink alcohol and does not use drugs.   Physical Exam: BP (!) 154/91   Pulse 94   Ht '5\' 8"'  (1.727 m)   Wt 230 lb (104.3 kg)   BMI 34.97 kg/m   Constitutional:  Alert and oriented, No acute distress. HEENT: [place] AT, moist mucus membranes.  Trachea midline, no masses. Cardiovascular: No clubbing, cyanosis, or edema. Respiratory: Normal respiratory effort, no increased work of breathing. Skin: No rashes, bruises or suspicious lesions. Psychiatric: Normal mood and affect.   Pertinent Imaging: CT images were personally reviewed and interpreted.  No renal calculi noted   Assessment & Plan:    1. Nephrolithiasis  4 mm right distal ureteral calculus  Bothersome storage elated voiding  symptoms and bladder spasm  Rx trospium 20 mg twice daily sent to pharmacy We discussed various treatment options for urolithiasis including observation with or without medical expulsive therapy, shockwave lithotripsy (SWL), ureteroscopy and laser lithotripsy with stent placement. We discussed that management is based on stone size, location, density, patient co-morbidities, and patient preference.  Stones <90m in size have a >80% spontaneous passage rate. Data surrounding the use of tamsulosin for medical expulsive therapy is controversial, but meta analyses suggests it is most efficacious for distal stones between 5-127min size. Possible side effects include dizziness/lightheadedness, and retrograde ejaculation. SWL has a lower stone free rate in a single procedure, but also a lower complication rate compared to ureteroscopy and avoids a stent and associated stent related symptoms. Possible complications include renal  hematoma, steinstrasse, and need for additional treatment. Ureteroscopy with laser lithotripsy and stent placement has a higher stone free rate than SWL in a single procedure, however increased complication rate including possible infection, ureteral injury, bleeding, and stent related morbidity. Common stent related symptoms include dysuria, urgency/frequency, and flank pain. After an extensive discussion of the risks and benefits of the above treatment options, the patient would like to proceed with MET  If he has persistent symptoms and desires treatment he was instructed to call back on Monday for scheduling   Abbie Sons, [place] [address], [place], [place] [address] [phone]

## 2020-10-24 ENCOUNTER — Telehealth: Payer: Self-pay | Admitting: *Deleted

## 2020-10-24 LAB — URINALYSIS, COMPLETE
Bilirubin, UA: NEGATIVE
Glucose, UA: NEGATIVE
Ketones, UA: NEGATIVE
Leukocytes,UA: NEGATIVE
Nitrite, UA: NEGATIVE
RBC, UA: NEGATIVE
Specific Gravity, UA: 1.015 (ref 1.005–1.030)
Urobilinogen, Ur: 0.2 mg/dL (ref 0.2–1.0)
pH, UA: 5.5 (ref 5.0–7.5)

## 2020-10-24 LAB — MICROSCOPIC EXAMINATION: Bacteria, UA: NONE SEEN

## 2020-10-24 MED ORDER — OXYBUTYNIN CHLORIDE 5 MG PO TABS: 5.0000 mg | ORAL_TABLET | Freq: Two times a day (BID) | ORAL | 0 refills | Status: DC

## 2020-10-24 NOTE — Telephone Encounter (Signed)
Patient's daughter called regarding Trospium-medication was not covered on patient's insurance. Would like an alternate sent in to [facility]. Please advise

## 2020-10-24 NOTE — Telephone Encounter (Signed)
Can send in Rx oxybutynin 5 mg twice daily #14.  This medication will have more side effects of dry mouth and possible confusion.

## 2020-10-24 NOTE — Telephone Encounter (Signed)
Patient's daughter instructed-sent in to pharmacy. Voiced understanding of side effects.

## 2020-10-27 ENCOUNTER — Other Ambulatory Visit: Payer: Self-pay | Admitting: Urology

## 2020-10-27 DIAGNOSIS — N201 Calculus of ureter: Secondary | ICD-10-CM

## 2020-10-27 MED ORDER — CHLORHEXIDINE GLUCONATE 0.12 % MT SOLN: 15.0000 mL | Freq: Once | OROMUCOSAL | Status: DC

## 2020-10-27 MED ORDER — LACTATED RINGERS IV SOLN: INTRAVENOUS | Status: DC

## 2020-10-27 MED ORDER — ORAL CARE MOUTH RINSE: 15.0000 mL | Freq: Once | OROMUCOSAL | Status: DC

## 2020-10-28 ENCOUNTER — Ambulatory Visit: Payer: Medicare Other | Admitting: Anesthesiology

## 2020-10-28 ENCOUNTER — Other Ambulatory Visit: Payer: Self-pay

## 2020-10-28 ENCOUNTER — Ambulatory Visit
Admission: RE | Admit: 2020-10-28 | Discharge: 2020-10-28 | Disposition: A | Discharge status: Home / Self Care | Payer: Medicare Other | Attending: Urology | Admitting: Urology

## 2020-10-28 ENCOUNTER — Encounter: Payer: Self-pay | Admitting: Urology

## 2020-10-28 ENCOUNTER — Ambulatory Visit: Payer: Medicare Other

## 2020-10-28 ENCOUNTER — Encounter
Admission: RE | Disposition: A | Discharge status: Home / Self Care | Payer: Self-pay | Source: Home / Self Care | Attending: Urology

## 2020-10-28 DIAGNOSIS — Z8249 Family history of ischemic heart disease and other diseases of the circulatory system: Secondary | ICD-10-CM | POA: Insufficient documentation

## 2020-10-28 DIAGNOSIS — E785 Hyperlipidemia, unspecified: Secondary | ICD-10-CM | POA: Insufficient documentation

## 2020-10-28 DIAGNOSIS — Z8719 Personal history of other diseases of the digestive system: Secondary | ICD-10-CM | POA: Insufficient documentation

## 2020-10-28 DIAGNOSIS — Z9079 Acquired absence of other genital organ(s): Secondary | ICD-10-CM | POA: Diagnosis not present

## 2020-10-28 DIAGNOSIS — E7800 Pure hypercholesterolemia, unspecified: Secondary | ICD-10-CM | POA: Diagnosis not present

## 2020-10-28 DIAGNOSIS — Z20822 Contact with and (suspected) exposure to covid-19: Secondary | ICD-10-CM | POA: Insufficient documentation

## 2020-10-28 DIAGNOSIS — K219 Gastro-esophageal reflux disease without esophagitis: Secondary | ICD-10-CM | POA: Diagnosis not present

## 2020-10-28 DIAGNOSIS — I10 Essential (primary) hypertension: Secondary | ICD-10-CM | POA: Diagnosis not present

## 2020-10-28 DIAGNOSIS — Z85820 Personal history of malignant melanoma of skin: Secondary | ICD-10-CM | POA: Diagnosis not present

## 2020-10-28 DIAGNOSIS — Z79899 Other long term (current) drug therapy: Secondary | ICD-10-CM | POA: Insufficient documentation

## 2020-10-28 DIAGNOSIS — Z8546 Personal history of malignant neoplasm of prostate: Secondary | ICD-10-CM | POA: Diagnosis not present

## 2020-10-28 DIAGNOSIS — G4730 Sleep apnea, unspecified: Secondary | ICD-10-CM | POA: Diagnosis not present

## 2020-10-28 DIAGNOSIS — K2270 Barrett's esophagus without dysplasia: Secondary | ICD-10-CM | POA: Diagnosis not present

## 2020-10-28 DIAGNOSIS — Z8619 Personal history of other infectious and parasitic diseases: Secondary | ICD-10-CM | POA: Diagnosis not present

## 2020-10-28 DIAGNOSIS — Z7982 Long term (current) use of aspirin: Secondary | ICD-10-CM | POA: Diagnosis not present

## 2020-10-28 DIAGNOSIS — N201 Calculus of ureter: Secondary | ICD-10-CM | POA: Insufficient documentation

## 2020-10-28 HISTORY — PX: CYSTOSCOPY/RETROGRADE/URETEROSCOPY/STONE EXTRACTION WITH BASKET: SHX5317

## 2020-10-28 LAB — SARS CORONAVIRUS 2 BY RT PCR (HOSPITAL ORDER, PERFORMED IN ~~LOC~~ HOSPITAL LAB): SARS Coronavirus 2: NEGATIVE

## 2020-10-28 SURGERY — CYSTOSCOPY, WITH CALCULUS REMOVAL USING BASKET
Anesthesia: General | Laterality: Right

## 2020-10-28 MED ORDER — FENTANYL CITRATE (PF) 100 MCG/2ML IJ SOLN
INTRAMUSCULAR | Status: DC | PRN
  Administered 2020-10-28: 50 ug via INTRAVENOUS

## 2020-10-28 MED ORDER — PROPOFOL 10 MG/ML IV BOLUS
INTRAVENOUS | Status: AC
  Filled 2020-10-28: qty 20

## 2020-10-28 MED ORDER — ONDANSETRON HCL 4 MG/2ML IJ SOLN: 4.0000 mg | Freq: Once | INTRAMUSCULAR | Status: DC | PRN

## 2020-10-28 MED ORDER — DEXAMETHASONE SODIUM PHOSPHATE 10 MG/ML IJ SOLN
INTRAMUSCULAR | Status: DC | PRN
  Administered 2020-10-28: 10 mg via INTRAVENOUS

## 2020-10-28 MED ORDER — CEFAZOLIN SODIUM-DEXTROSE 2-4 GM/100ML-% IV SOLN
2.0000 g | INTRAVENOUS | Status: AC
  Administered 2020-10-28: 2 g via INTRAVENOUS

## 2020-10-28 MED ORDER — CEFAZOLIN SODIUM-DEXTROSE 2-4 GM/100ML-% IV SOLN
INTRAVENOUS | Status: AC
  Filled 2020-10-28: qty 100

## 2020-10-28 MED ORDER — FENTANYL CITRATE (PF) 100 MCG/2ML IJ SOLN: 25.0000 ug | INTRAMUSCULAR | Status: DC | PRN

## 2020-10-28 MED ORDER — OXYCODONE HCL 5 MG PO TABS: 5.0000 mg | ORAL_TABLET | Freq: Once | ORAL | Status: DC | PRN

## 2020-10-28 MED ORDER — ONDANSETRON HCL 4 MG/2ML IJ SOLN
INTRAMUSCULAR | Status: DC | PRN
  Administered 2020-10-28: 4 mg via INTRAVENOUS

## 2020-10-28 MED ORDER — OXYCODONE HCL 5 MG/5ML PO SOLN: 5.0000 mg | Freq: Once | ORAL | Status: DC | PRN

## 2020-10-28 MED ORDER — IOHEXOL 180 MG/ML  SOLN
INTRAMUSCULAR | Status: DC | PRN
  Administered 2020-10-28: 11 mL

## 2020-10-28 MED ORDER — CHLORHEXIDINE GLUCONATE 0.12 % MT SOLN
OROMUCOSAL | Status: AC
  Filled 2020-10-28: qty 15

## 2020-10-28 MED ORDER — FENTANYL CITRATE (PF) 100 MCG/2ML IJ SOLN
INTRAMUSCULAR | Status: AC
  Filled 2020-10-28: qty 2

## 2020-10-28 MED ORDER — LIDOCAINE HCL (CARDIAC) PF 100 MG/5ML IV SOSY
PREFILLED_SYRINGE | INTRAVENOUS | Status: DC | PRN
  Administered 2020-10-28: 80 mg via INTRAVENOUS

## 2020-10-28 MED ORDER — PROPOFOL 10 MG/ML IV BOLUS
INTRAVENOUS | Status: DC | PRN
  Administered 2020-10-28: 150 mg via INTRAVENOUS

## 2020-10-28 SURGICAL SUPPLY — 28 items
BAG DRAIN CYSTO-URO LG1000N (MISCELLANEOUS) ×2 IMPLANT
BASKET ZERO TIP 1.9FR (BASKET) IMPLANT
BRUSH SCRUB EZ 1% IODOPHOR (MISCELLANEOUS) ×2 IMPLANT
CATH URET FLEX-TIP 2 LUMEN 10F (CATHETERS) IMPLANT
CATH URETL 5X70 OPEN END (CATHETERS) ×2 IMPLANT
CNTNR SPEC 2.5X3XGRAD LEK (MISCELLANEOUS)
CONT SPEC 4OZ STER OR WHT (MISCELLANEOUS)
CONTAINER SPEC 2.5X3XGRAD LEK (MISCELLANEOUS) IMPLANT
DRAPE UTILITY 15X26 TOWEL STRL (DRAPES) ×2 IMPLANT
GLOVE SURG UNDER POLY LF SZ7.5 (GLOVE) ×2 IMPLANT
GOWN STRL REUS W/ TWL LRG LVL3 (GOWN DISPOSABLE) ×1 IMPLANT
GOWN STRL REUS W/ TWL XL LVL3 (GOWN DISPOSABLE) ×1 IMPLANT
GOWN STRL REUS W/TWL LRG LVL3 (GOWN DISPOSABLE) ×1
GOWN STRL REUS W/TWL XL LVL3 (GOWN DISPOSABLE) ×1
GUIDEWIRE STR DUAL SENSOR (WIRE) ×2 IMPLANT
INFUSOR MANOMETER BAG 3000ML (MISCELLANEOUS) ×2 IMPLANT
IV NS IRRIG 3000ML ARTHROMATIC (IV SOLUTION) ×2 IMPLANT
KIT TURNOVER CYSTO (KITS) ×2 IMPLANT
MANIFOLD NEPTUNE II (INSTRUMENTS) ×2 IMPLANT
PACK CYSTO AR (MISCELLANEOUS) ×2 IMPLANT
SET CYSTO W/LG BORE CLAMP LF (SET/KITS/TRAYS/PACK) ×2 IMPLANT
SHEATH URETERAL 12FRX35CM (MISCELLANEOUS) IMPLANT
STENT URET 6FRX24 CONTOUR (STENTS) IMPLANT
STENT URET 6FRX26 CONTOUR (STENTS) IMPLANT
SURGILUBE 2OZ TUBE FLIPTOP (MISCELLANEOUS) ×2 IMPLANT
TRACTIP FLEXIVA PULSE ID 200 (Laser) ×2 IMPLANT
VALVE UROSEAL ADJ ENDO (VALVE) IMPLANT
WATER STERILE IRR 1000ML POUR (IV SOLUTION) ×2 IMPLANT

## 2020-10-28 NOTE — Anesthesia Procedure Notes (Signed)
Procedure Name: LMA Insertion Date/Time: 10/28/2020 3:02 PM Performed by: Jonna Clark, CRNA Pre-anesthesia Checklist: Patient identified, Patient being monitored, Timeout performed, Emergency Drugs available and Suction available Patient Re-evaluated:Patient Re-evaluated prior to induction Oxygen Delivery Method: Circle system utilized Preoxygenation: Pre-oxygenation with 100% oxygen Induction Type: IV induction Ventilation: Mask ventilation without difficulty LMA: LMA inserted LMA Size: 4.0 Tube type: Oral Number of attempts: 1 Placement Confirmation: positive ETCO2 and breath sounds checked- equal and bilateral Tube secured with: Tape Dental Injury: Teeth and Oropharynx as per pre-operative assessment

## 2020-10-28 NOTE — Anesthesia Preprocedure Evaluation (Addendum)
Anesthesia Evaluation  Patient identified by MRN, date of birth, ID band Patient awake    Reviewed: Allergy & Precautions, H&P , NPO status , Patient's Chart, lab work & pertinent test results  History of Anesthesia Complications Negative for: history of anesthetic complications  Airway Mallampati: II  TM Distance: >3 FB     Dental  (+) Edentulous Upper   Pulmonary sleep apnea , neg COPD,    breath sounds clear to auscultation       Cardiovascular hypertension, (-) angina(-) Past MI and (-) Cardiac Stents + dysrhythmias ("sometimes AFib") Atrial Fibrillation  Rhythm:regular Rate:Normal     Neuro/Psych Chronic pain on opioid therapy negative neurological ROS  negative psych ROS   GI/Hepatic Neg liver ROS, GERD  ,Barrett's esophagus   Endo/Other  Hypothyroidism   Renal/GU      Musculoskeletal   Abdominal   Peds  Hematology negative hematology ROS (+)   Anesthesia Other Findings Obese  Past Medical History: 03/21/2018: Acute postoperative pain No date: Anemia No date: Arthritis No date: Barrett esophagus No date: Cancer ([place])     Comment:  prostate,skin No date: Chicken pox No date: Diverticulitis No date: Dysrhythmia No date: GERD (gastroesophageal reflux disease) No date: Hyperlipidemia No date: Hypertension No date: Hypothyroidism No date: Melanoma ([place])     Comment:  Malignant resection No date: Sleep apnea No date: Ulcer  Past Surgical History: No date: CARDIAC CATHETERIZATION 02/13/14: Cataract Surgery; Right 2006-2008-2011: COLON SURGERY     Comment:  polyps removed No date: COLONOSCOPY W/ POLYPECTOMY 02/15/2018: COLONOSCOPY WITH PROPOFOL; N/A     Comment:  Procedure: COLONOSCOPY WITH PROPOFOL;  Surgeon: Manya Silvas, MD;  Location: [facility] ENDOSCOPY;  Service:               Endoscopy;  Laterality: N/A; 03/24/2020: COLONOSCOPY WITH PROPOFOL; N/A     Comment:  Procedure:  COLONOSCOPY WITH PROPOFOL;  Surgeon:               Lesly Rubenstein, MD;  Location: [facility] ENDOSCOPY;                Service: Endoscopy;  Laterality: N/A; 2003: cystocopy 08/04/2016: ESOPHAGOGASTRODUODENOSCOPY (EGD) WITH PROPOFOL; N/A     Comment:  Procedure: ESOPHAGOGASTRODUODENOSCOPY (EGD) WITH               PROPOFOL;  Surgeon: Manya Silvas, MD;  Location: [facility]              ENDOSCOPY;  Service: Endoscopy;  Laterality: N/A; 03/24/2020: ESOPHAGOGASTRODUODENOSCOPY (EGD) WITH PROPOFOL; N/A     Comment:  Procedure: ESOPHAGOGASTRODUODENOSCOPY (EGD) WITH               PROPOFOL;  Surgeon: Lesly Rubenstein, MD;  Location:               [facility] ENDOSCOPY;  Service: Endoscopy;  Laterality: N/A; No date: HEMORRHOID SURGERY No date: PROSTATE SURGERY No date: sleep study     Reproductive/Obstetrics negative OB ROS                            Anesthesia Physical Anesthesia Plan  ASA: III  Anesthesia Plan: General LMA   Post-op Pain Management:    Induction:   PONV Risk Score and Plan: Dexamethasone, Ondansetron and Treatment may vary due to age or medical condition  Airway Management Planned:   Additional Equipment:  Intra-op Plan:   Post-operative Plan:   Informed Consent: I have reviewed the patients History and Physical, chart, labs and discussed the procedure including the risks, benefits and alternatives for the proposed anesthesia with the patient or authorized representative who has indicated his/her understanding and acceptance.     Dental Advisory Given  Plan Discussed with: Anesthesiologist, CRNA and Surgeon  Anesthesia Plan Comments:         Anesthesia Quick Evaluation

## 2020-10-28 NOTE — Transfer of Care (Signed)
Immediate Anesthesia Transfer of Care Note  Patient: Hector [name]  Procedure(s) Performed: CYSTOSCOPY/RETROGRADE PYELOGRAM/STONE EXTRACTION (Right )  Patient Location: PACU  Anesthesia Type:General  Level of Consciousness: drowsy and patient cooperative  Airway & Oxygen Therapy: Patient Spontanous Breathing and Patient connected to face mask oxygen  Post-op Assessment: Report given to RN and Post -op Vital signs reviewed and stable  Post vital signs: Reviewed and stable  Last Vitals:  Vitals Value Taken Time  BP 145/80 10/28/20 1539  Temp    Pulse 43 10/28/20 1539  Resp 19 10/28/20 1539  SpO2 100 % 10/28/20 1539  Vitals shown include unvalidated device data.  Last Pain:  Vitals:   10/28/20 1350  TempSrc: Oral  PainSc: 3          Complications: No complications documented.

## 2020-10-28 NOTE — Op Note (Signed)
Preoperative diagnosis:  1. Right distal ureteral calculus  Postoperative diagnosis:  1. Passed ureteral calculus  Procedure: 1. Cystoscopy with stone removal 2. Right retrograde pyelogram  Surgeon: Abbie Sons, MD  Anesthesia: General  Complications: None  Intraoperative findings:  Cystoscopy-urethra normal, without stricture; prostate surgically absent.  Bladder neck contracture tight to the 21 [place] scope which was gently negotiated into the bladder.  Bladder mucosa without erythema, solid or papillary lesions.  4 mm calculus noted in the bladder.  Mild inflammatory changes right ureteral orifice  EBL: Minimal  Specimens: Calculus for analysis  Indication: Hector [name] is a 86 y.o. male who presented to the ED last week with flank pain and a 4 mm right distal ureteral calculus on CT.  He was complaining of significant pain after voiding.  He contacted the office yesterday requesting ureteroscopic removal.  He states today his discomfort is still present but improved.  He is not aware of passing a stone.  After reviewing the management options for treatment, he elected to proceed with the above surgical procedure(s). We have discussed the potential benefits and risks of the procedure, side effects of the proposed treatment, the likelihood of the patient achieving the goals of the procedure, and any potential problems that might occur during the procedure or recuperation. Informed consent has been obtained.  Description of procedure:  The patient was taken to the operating room and general anesthesia was induced.  The patient was placed in the dorsal lithotomy position, prepped and draped in the usual sterile fashion, and preoperative antibiotics were administered. A preoperative time-out was performed.   A 21 French scope was lubricated, passed per urethra and advanced proximally under direct vision with findings as described above.  The calculus was able to be irrigated from  the bladder through the cystoscope and was sent for analysis.  A 0.038 sensor wire was placed into the right ureteral orifice. A 5 French open-ended ureteral catheter was advanced over the wire and retrograde pyelogram was performed with findings described above.  Prompt emptying of the ureter was noted under real-time fluoroscopy.  The bladder was emptied and the cystoscope was removed. After anesthetic reversal he was transported the PACU in stable condition.  Plan:  Postop follow-up 1 month for review of stone analysis   Abbie Sons, M.D.

## 2020-10-28 NOTE — Interval H&P Note (Signed)
History and Physical Interval Note:  Patient with persistent discomfort and pain after voiding.  Not aware of passing a stone.  He called yesterday requesting ureteroscopic removal.  The procedure was discussed in detail including potential risks of bleeding, infection, ureteral injury and need for a ureteral stent.  All questions were answered and he desires to proceed.  10/28/2020 2:21 PM  Hector [name]  has presented today for surgery, with the diagnosis of right.  The various methods of treatment have been discussed with the patient and family. After consideration of risks, benefits and other options for treatment, the patient has consented to  Procedure(s): CYSTOSCOPY/URETEROSCOPY/HOLMIUM LASER/STENT PLACEMENT (Right) as a surgical intervention.  The patient's history has been reviewed, patient examined, no change in status, stable for surgery.  I have reviewed the patient's chart and labs.  Questions were answered to the patient's satisfaction.     [place]

## 2020-10-28 NOTE — Discharge Instructions (Addendum)
Cystoscopy Cystoscopy is a procedure that is used to help diagnose and sometimes treat conditions that affect the lower urinary tract. The lower urinary tract includes the bladder and the urethra. The urethra is the tube that drains urine from the bladder. Cystoscopy is done using a thin, tube-shaped instrument with a light and camera at the end (cystoscope). The cystoscope may be hard or flexible, depending on the goal of the procedure. The cystoscope is inserted through the urethra, into the bladder. Cystoscopy may be recommended if you have:  Urinary tract infections that keep coming back.  Blood in the urine (hematuria).  An inability to control when you urinate (urinary incontinence) or an overactive bladder.  Unusual cells found in a urine sample.  A blockage in the urethra, such as a urinary stone.  Painful urination.  An abnormality in the bladder found during an intravenous pyelogram (IVP) or CT scan. Cystoscopy may also be done to remove a sample of tissue to be examined under a microscope (biopsy). Tell a health care provider about:  Any allergies you have.  All medicines you are taking, including vitamins, herbs, eye drops, creams, and over-the-counter medicines.  Any problems you or family members have had with anesthetic medicines.  Any blood disorders you have.  Any surgeries you have had.  Any medical conditions you have.  Whether you are pregnant or may be pregnant. What are the risks? Generally, this is a safe procedure. However, problems may occur, including:  Infection.  Bleeding.  Allergic reactions to medicines.  Damage to other structures or organs. What happens before the procedure? Medicines Ask your health care provider about:  Changing or stopping your regular medicines. This is especially important if you are taking diabetes medicines or blood thinners.  Taking medicines such as aspirin and ibuprofen. These medicines can thin your blood. Do  not take these medicines unless your health care provider tells you to take them.  Taking over-the-counter medicines, vitamins, herbs, and supplements. Tests You may have an exam or testing, such as:  X-rays of the bladder, urethra, or kidneys.  CT scan of the abdomen or pelvis.  Urine tests to check for signs of infection. General instructions  Follow instructions from your health care provider about eating or drinking restrictions.  Ask your health care provider what steps will be taken to help prevent infection. These steps may include: ? Washing skin with a germ-killing soap. ? Taking antibiotic medicine.  Plan to have a responsible adult take you home from the hospital or clinic. What happens during the procedure?  You will be given one or more of the following: ? A medicine to help you relax (sedative). ? A medicine to numb the area (local anesthetic).  The area around the opening of your urethra will be cleaned.  The cystoscope will be passed through your urethra into your bladder.  Germ-free (sterile) fluid will flow through the cystoscope to fill your bladder. The fluid will stretch your bladder so that your health care provider can clearly examine your bladder walls.  Your doctor will look at the urethra and bladder. Your doctor may take a biopsy or remove stones.  The cystoscope will be removed, and your bladder will be emptied. The procedure may vary among health care providers and hospitals.   What can I expect after the procedure? After the procedure, it is common to have:  Some soreness or pain in your abdomen and urethra.  Urinary symptoms. These include: ? Mild pain or burning  when you urinate. Pain should stop within a few minutes after you urinate. This may last for up to 1 week. ? A small amount of blood in your urine for several days. ? Feeling like you need to urinate but producing only a small amount of urine. Follow these instructions at  home: Medicines  Take over-the-counter and prescription medicines only as told by your health care provider.  If you were prescribed an antibiotic medicine, take it as told by your health care provider. Do not stop taking the antibiotic even if you start to feel better. General instructions  Return to your normal activities as told by your health care provider. Ask your health care provider what activities are safe for you.  If you were given a sedative during the procedure, it can affect you for several hours. Do not drive or operate machinery until your health care provider says that it is safe.  Watch for any blood in your urine. If the amount of blood in your urine increases, call your health care provider.  Follow instructions from your health care provider about eating or drinking restrictions.  If a tissue sample was removed for testing (biopsy) during your procedure, it is up to you to get your test results. Ask your health care provider, or the department that is doing the test, when your results will be ready.  Drink enough fluid to keep your urine pale yellow.  Keep all follow-up visits. This is important. Contact a health care provider if:  You have pain that gets worse or does not get better with medicine, especially pain when you urinate.  You have trouble urinating.  You have more blood in your urine. Get help right away if:  You have blood clots in your urine.  You have abdominal pain.  You have a fever or chills.  You are unable to urinate. Summary  Cystoscopy is a procedure that is used to help diagnose and sometimes treat conditions that affect the lower urinary tract.  Cystoscopy is done using a thin, tube-shaped instrument with a light and camera at the end.  After the procedure, it is common to have some soreness or pain in your abdomen and urethra.  Watch for any blood in your urine. If the amount of blood in your urine increases, call your health  care provider.  If you were prescribed an antibiotic medicine, take it as told by your health care provider. Do not stop taking the antibiotic even if you start to feel better. This information is not intended to replace advice given to you by your health care provider. Make sure you discuss any questions you have with your health care provider. Document Revised: 02/29/2020 Document Reviewed: 02/29/2020 Elsevier Patient Education  2021 [place]   1) The drugs that you were given will stay in your system until tomorrow so for the next 24 hours you should not:  A) Drive an automobile B) Make any legal decisions C) Drink any alcoholic beverage   2) You may resume regular meals tomorrow.  Today it is better to start with liquids and gradually work up to solid foods.  You may eat anything you prefer, but it is better to start with liquids, then soup and crackers, and gradually work up to solid foods.   3) Please notify your doctor immediately if you have any unusual bleeding, trouble breathing, redness and pain at the surgery site, drainage, fever, or pain not relieved by  medication.    4) Additional Instructions:        Please contact your physician with any problems or Same Day Surgery at [phone], Monday through Friday 6 am to 4 pm, or [place] at [facility] number at [phone].

## 2020-10-29 NOTE — Anesthesia Postprocedure Evaluation (Signed)
Anesthesia Post Note  Patient: Hector [name]  Procedure(s) Performed: CYSTOSCOPY/RETROGRADE PYELOGRAM/STONE EXTRACTION (Right )  Patient location during evaluation: PACU Anesthesia Type: General Level of consciousness: awake and alert Pain management: pain level controlled Vital Signs Assessment: post-procedure vital signs reviewed and stable Respiratory status: spontaneous breathing, nonlabored ventilation and respiratory function stable Cardiovascular status: blood pressure returned to baseline and stable Postop Assessment: no apparent nausea or vomiting Anesthetic complications: no   No complications documented.   Last Vitals:  Vitals:   10/28/20 1556 10/28/20 1618  BP: (!) 146/67 (!) 162/62  Pulse: 65 61  Resp: 19 17  Temp: 37.2 C (!) 36.3 C  SpO2: 95% 95%    Last Pain:  Vitals:   10/28/20 1618  TempSrc: Tympanic  PainSc: 0-No pain                 Brett Canales Kvion Shapley

## 2020-10-30 ENCOUNTER — Encounter: Payer: Self-pay | Admitting: Urology

## 2020-11-02 LAB — CALCULI, WITH PHOTOGRAPH (CLINICAL LAB)
Calcium Oxalate Dihydrate: 20 %
Calcium Oxalate Monohydrate: 80 %
Weight Calculi: 26 mg

## 2020-11-04 DIAGNOSIS — Z79891 Long term (current) use of opiate analgesic: Secondary | ICD-10-CM | POA: Insufficient documentation

## 2020-11-04 NOTE — Progress Notes (Signed)
PROVIDER NOTE: Information contained herein reflects review and annotations entered in association with encounter. Interpretation of such information and data should be left to medically-trained personnel. Information provided to patient can be located elsewhere in the medical record under "Patient Instructions". Document created using STT-dictation technology, any transcriptional errors that may result from process are unintentional.    Patient: Hector [name]  Service Category: E/M  Provider: Gaspar Cola, MD  DOB: Jan 23, 1935  DOS: 11/05/2020  Specialty: Interventional Pain Management  MRN: [id number]  Setting: Ambulatory outpatient  PCP: Einar Pheasant, MD  Type: Established Patient    Referring Provider: Einar Pheasant, MD  Location: Office  Delivery: Face-to-face     HPI  [name], a 86 y.o. year old male, is here today because of his Chronic pain syndrome [G89.4]. [name] primary complain today is Back Pain Last encounter: My last encounter with him was on 08/25/2020. Pertinent problems: [name] has History of prostate cancer; Shoulder pain, left; Chronic hip pain (Left); Chronic low back pain (1ry area of Pain) (Bilateral) (L>R) w/ sciatica (Left); Chronic pain syndrome; Polyarthritis, unspecified (Secondary Area of Pain); L5-S1 bilateral pars defect with spondylolisthesis; Lumbosacral Grade 1 Anterolisthesis of L5 over S1; Lumbar facet arthropathy (Bilateral); Lumbar foraminal stenosis (L5-S1) (Bilateral) (L>R); Chronic Lumbar radiculitis (L5) (Left); Chronic musculoskeletal pain; Lumbar facet syndrome (Bilateral) (L>R); Chronic lower extremity pain (Secondary Area of Pain) (Left); Chronic lower extremity radicular pain (L5) (Left); Lumbar facet osteoarthritis (Bilateral); Osteoarthritis of lumbar spine; Lumbar spondylosis; Spondylosis without myelopathy or radiculopathy, lumbosacral region; Chronic right shoulder pain; DDD (degenerative disc disease), lumbosacral; Gout; and Chronic  low back pain (Bilateral) w/o sciatica on their pertinent problem list. Pain Assessment: Severity of Chronic pain is reported as a 0-No pain (No pain today)/10. Location: Back Lower,Right/denies. Onset: More than a month ago. Quality:  (denies today). Timing: Rarely. Modifying factor(s): medications, rest. Vitals:  height is 5' 8" (1.727 m) and weight is 230 lb (104.3 kg). His temperature is 97.3 F (36.3 C) (abnormal). His blood pressure is 143/69 (abnormal) and his pulse is 46 (abnormal). His respiration is 16 and oxygen saturation is 99%.   Reason for encounter: medication management.   The patient indicates doing well with the current medication regimen. No adverse reactions or side effects reported to the medications.  The patient indicates today that the last procedure that we did for him on 08/07/2020 (right-sided lumbar facet block) has provided him with excellent relief of the pain and he indicates currently having absolutely no pain.  He also indicates that the procedure allowed him to increase his level of activity and range of motion.  He has lost some weight, and he refers that he has been able to trim some of his trees, secondary to the benefit that he obtained from that treatment.  RTCB: 02/04/2021 Nonopioids transferred 05/07/2020: Calcium and vitamin D3  Pharmacotherapy Assessment   Analgesic: Tramadol 50 mg, 1 tab PO q 8 hrs (150 mg/day of tramadol) MME/day:75m/day.   Monitoring: [place] PMP: PDMP reviewed during this encounter.       Pharmacotherapy: No side-effects or adverse reactions reported. Compliance: No problems identified. Effectiveness: Clinically acceptable.  GIgnatius Specking RN  11/05/2020  8:54 AM  Sign when Signing Visit Nursing Pain Medication Assessment:  Safety precautions to be maintained throughout the outpatient stay will include: orient to surroundings, keep bed in low position, maintain call bell within reach at all times, provide assistance with transfer out  of bed and ambulation.  Medication Inspection Compliance: Pill count conducted under aseptic conditions, in front of the patient. Neither the pills nor the bottle was removed from the patient's sight at any time. Once count was completed pills were immediately returned to the patient in their original bottle.  Medication: Tramadol (Ultram) Pill/Patch Count: 37 of 90 pills remain Pill/Patch Appearance: Markings consistent with prescribed medication Bottle Appearance: Standard pharmacy container. Clearly labeled. Filled Date: 2 / 25 / 2022 Last Medication intake:  Today    UDS:  Summary  Date Value Ref Range Status  05/07/2020 Note  Final    Comment:    ==================================================================== [employer] Select 13 (MW) ==================================================================== Test                             Result       Flag       Units  Drug Present and Declared for Prescription Verification   Tramadol                       >1825        EXPECTED   ng/mg creat   O-Desmethyltramadol            1023         EXPECTED   ng/mg creat   N-Desmethyltramadol            >1825        EXPECTED   ng/mg creat    Source of tramadol is a prescription medication. O-desmethyltramadol    and N-desmethyltramadol are expected metabolites of tramadol.  ==================================================================== Test                      Result    Flag   Units      Ref Range   Creatinine              274              mg/dL      >=20 ==================================================================== Declared Medications:  The flagging and interpretation on this report are based on the  following declared medications.  Unexpected results may arise from  inaccuracies in the declared medications.   **Note: The testing scope of this panel includes these medications:   Tramadol (Ultram)   **Note: The testing scope of this panel does not include the   following reported medications:   Acetaminophen (Tylenol)  Allopurinol (Zyloprim)  Amlodipine (Norvasc)  Aspirin  Calcium  Cholecalciferol  Fluticasone (Flonase)  Hydralazine (Apresoline)  Hyoscyamine  Irbesartan (Avapro)  Levothyroxine (Synthroid)  Lysine  Omeprazole (Prilosec)  Rosuvastatin (Crestor)  Sennosides  Valacyclovir (Valtrex) ==================================================================== For clinical consultation, please call [phone]. ====================================================================      ROS  Constitutional: Denies any fever or chills Gastrointestinal: No reported hemesis, hematochezia, vomiting, or acute GI distress Musculoskeletal: Denies any acute onset joint swelling, redness, loss of ROM, or weakness Neurological: No reported episodes of acute onset apraxia, aphasia, dysarthria, agnosia, amnesia, paralysis, loss of coordination, or loss of consciousness  Medication Review  L-Lysine, acetaminophen, allopurinol, amLODipine, aspirin, calcium carbonate, fluticasone, hydrALAZINE, hyoscyamine, irbesartan, levothyroxine, omeprazole, ondansetron, oxyCODONE-acetaminophen, oxybutynin, rosuvastatin, senna, traMADol, and valACYclovir  History Review  Allergy: Mr. Gnau is allergic to tape. Drug: Mr. Dubray  reports no history of drug use. Alcohol:  reports no history of alcohol use. Tobacco:  reports that he has never smoked. He has never used smokeless tobacco. Social: Mr. Gaertner  reports that  he has never smoked. He has never used smokeless tobacco. He reports that he does not drink alcohol and does not use drugs. Medical:  has a past medical history of Acute postoperative pain (03/21/2018), Anemia, Arthritis, Barrett esophagus, Cancer (HCC), Chicken pox, Diverticulitis, Dysrhythmia, GERD (gastroesophageal reflux disease), Hyperlipidemia, Hypertension, Hypothyroidism, Melanoma (HCC), Sleep apnea, and Ulcer. Surgical: Mr. Guillot  has a past  surgical history that includes Prostate surgery; cystocopy (2003); Hemorrhoid surgery; Cardiac catheterization; sleep study; Colon surgery (2006-2008-2011); Cataract Surgery (Right, 02/13/14); Esophagogastroduodenoscopy (egd) with propofol (N/A, 08/04/2016); Colonoscopy w/ polypectomy; Colonoscopy with propofol (N/A, 02/15/2018); Colonoscopy with propofol (N/A, 03/24/2020); Esophagogastroduodenoscopy (egd) with propofol (N/A, 03/24/2020); and Cystoscopy/retrograde/ureteroscopy/stone extraction with basket (Right, 10/28/2020). Family: family history includes Heart disease in his father; Kidney disease in his father; Liver disease in his mother.  Laboratory Chemistry Profile   Renal Lab Results  Component Value Date   BUN 18 10/18/2020   CREATININE 1.77 (H) 10/18/2020   BCR 10 06/13/2017   GFR 49.04 (L) 10/14/2020   GFRAA >60 01/23/2018   GFRNONAA 38 (L) 10/18/2020     Hepatic Lab Results  Component Value Date   AST 16 10/18/2020   ALT 13 10/18/2020   ALBUMIN 4.1 10/18/2020   ALKPHOS 60 10/18/2020   LIPASE 27 10/18/2020     Electrolytes Lab Results  Component Value Date   NA 137 10/18/2020   K 3.8 10/18/2020   CL 104 10/18/2020   CALCIUM 9.5 10/18/2020   MG 2.0 06/13/2017     Bone Lab Results  Component Value Date   VD25OH 21.46 (L) 03/28/2018   25OHVITD1 20 (L) 06/13/2017   25OHVITD2 <1.0 06/13/2017   25OHVITD3 20 06/13/2017     Inflammation (CRP: Acute Phase) (ESR: Chronic Phase) Lab Results  Component Value Date   CRP 2.8 06/13/2017   ESRSEDRATE 13 06/13/2017       Note: Above Lab results reviewed.  Recent Imaging Review  DG OR UROLOGY CYSTO IMAGE ([facility] ONLY) There is no interpretation for this exam.    This order is for images obtained during a surgical procedure.  Please See  "Surgeries" Tab for more information regarding the procedure. Note: Reviewed        Physical Exam  General appearance: Well nourished, well developed, and well hydrated. In no apparent  acute distress Mental status: Alert, oriented x 3 (person, place, & time)       Respiratory: No evidence of acute respiratory distress Eyes: PERLA Vitals: BP (!) 143/69   Pulse (!) 46   Temp (!) 97.3 F (36.3 C)   Resp 16   Ht 5' 8" (1.727 m)   Wt 230 lb (104.3 kg)   SpO2 99%   BMI 34.97 kg/m  BMI: Estimated body mass index is 34.97 kg/m as calculated from the following:   Height as of this encounter: 5' 8" (1.727 m).   Weight as of this encounter: 230 lb (104.3 kg). Ideal: Ideal body weight: 68.4 kg (150 lb 12.7 oz) Adjusted ideal body weight: 82.8 kg (182 lb 7.6 oz)  Assessment   Status Diagnosis  Controlled Controlled Controlled 1. Chronic pain syndrome   2. Lumbar facet syndrome (Bilateral) (L>R)   3. Lumbar facet arthropathy (Bilateral)   4. Chronic low back pain (Bilateral) w/o sciatica   5. Pharmacologic therapy   6. Chronic use of opiate for therapeutic purpose   7. Uncomplicated opioid dependence (HCC)      Updated Problems: No problems updated.  Plan of Care  Problem-specific:    No problem-specific Assessment & Plan notes found for this encounter.  Mr. Nelvin A Keiper has a current medication list which includes the following long-term medication(s): allopurinol, fluticasone, hyoscyamine, irbesartan, rosuvastatin, amlodipine, calcium 600, hydralazine, levothyroxine, omeprazole, and [START ON 11/06/2020] tramadol.  Pharmacotherapy (Medications Ordered): Meds ordered this encounter  Medications  . traMADol (ULTRAM) 50 MG tablet    Sig: Take 1 tablet (50 mg total) by mouth every 8 (eight) hours as needed for severe pain.    Dispense:  270 tablet    Refill:  0    Chronic Pain: STOP Act (Not applicable) Fill 1 day early if closed on refill date. Avoid benzodiazepines within 8 hours of opioids   Orders:  No orders of the defined types were placed in this encounter.  Follow-up plan:   Return in about 3 months (around 02/04/2021) for (F2F), (MM).       Interventional Therapies  Risk  Complexity Considerations:   POOR Candidate for any more RFA due to movement and body habitus.   Planned  Pending:   Pending further evaluation   Under consideration:   Diagnostic left L5 TFESI  Possible left L5 nerve root ganglion RFA Diagnosticleft L4-5 LESI   Completed:   Diagnostic midline caudal ESI x2 (07/03/2019) Palliativeright lumbar facet block x3(08/07/2020) Palliativeleft lumbar facet block x2(10/27/2017) Palliative right lumbar facet RFA x1 (04/18/2018)  Palliative left lumbar facet RFA x1 (03/21/2018)    Therapeutic  Palliative (PRN) options:   Diagnostic midline caudal ESI #3  Palliativeright lumbar facet block #4 Palliativeleft lumbar facet block #3 Palliative right lumbar facet RFA #2  Palliative left lumbar facet RFA #2      Recent Visits Date Type Provider Dept  08/25/20 Office Visit Naveira, Francisco, MD [facility]  08/07/20 Procedure visit Naveira, Francisco, MD [facility]  Showing recent visits within past 90 days and meeting all other requirements Today's Visits Date Type Provider Dept  11/05/20 Office Visit Naveira, Francisco, MD [facility]  Showing today's visits and meeting all other requirements Future Appointments No visits were found meeting these conditions. Showing future appointments within next 90 days and meeting all other requirements  I discussed the assessment and treatment plan with the patient. The patient was provided an opportunity to ask questions and all were answered. The patient agreed with the plan and demonstrated an understanding of the instructions.  Patient advised to call back or seek an in-person evaluation if the symptoms or condition worsens.  Duration of encounter: 30 minutes.  Note by: Francisco A Naveira, MD Date: 11/05/2020; Time: 9:20 AM 

## 2020-11-05 ENCOUNTER — Ambulatory Visit: Payer: Medicare Other | Attending: Pain Medicine | Admitting: Pain Medicine

## 2020-11-05 ENCOUNTER — Encounter: Payer: Self-pay | Admitting: Pain Medicine

## 2020-11-05 ENCOUNTER — Other Ambulatory Visit: Payer: Self-pay

## 2020-11-05 VITALS — BP 143/69 | HR 46 | Temp 97.3°F | Resp 16 | Ht 68.0 in | Wt 230.0 lb

## 2020-11-05 DIAGNOSIS — F1120 Opioid dependence, uncomplicated: Secondary | ICD-10-CM | POA: Insufficient documentation

## 2020-11-05 DIAGNOSIS — M47816 Spondylosis without myelopathy or radiculopathy, lumbar region: Secondary | ICD-10-CM | POA: Diagnosis not present

## 2020-11-05 DIAGNOSIS — Z79899 Other long term (current) drug therapy: Secondary | ICD-10-CM | POA: Diagnosis not present

## 2020-11-05 DIAGNOSIS — G894 Chronic pain syndrome: Secondary | ICD-10-CM | POA: Diagnosis not present

## 2020-11-05 DIAGNOSIS — G8929 Other chronic pain: Secondary | ICD-10-CM | POA: Insufficient documentation

## 2020-11-05 DIAGNOSIS — Z79891 Long term (current) use of opiate analgesic: Secondary | ICD-10-CM | POA: Diagnosis not present

## 2020-11-05 DIAGNOSIS — M545 Low back pain, unspecified: Secondary | ICD-10-CM | POA: Insufficient documentation

## 2020-11-05 MED ORDER — TRAMADOL HCL 50 MG PO TABS: 50.0000 mg | ORAL_TABLET | Freq: Three times a day (TID) | ORAL | 0 refills | Status: DC | PRN

## 2020-11-05 NOTE — Patient Instructions (Signed)
____________________________________________________________________________________________  Medication Recommendations and Reminders  Applies to: All patients receiving prescriptions (written and/or electronic).  Medication Rules & Regulations: These rules and regulations exist for your safety and that of others. They are not flexible and neither are we. Dismissing or ignoring them will be considered "non-compliance" with medication therapy, resulting in complete and irreversible termination of such therapy. (See document titled "Medication Rules" for more details.) In all conscience, because of safety reasons, we cannot continue providing a therapy where the patient does not follow instructions.  Pharmacy of record:   Definition: This is the pharmacy where your electronic prescriptions will be sent.   We do not endorse any particular pharmacy, however, we have experienced problems with [facility] not securing enough medication supply for the community.  We do not restrict you in your choice of pharmacy. However, once we write for your prescriptions, we will NOT be re-sending more prescriptions to fix restricted supply problems created by your pharmacy, or your insurance.   The pharmacy listed in the electronic medical record should be the one where you want electronic prescriptions to be sent.  If you choose to change pharmacy, simply notify our nursing staff.  Recommendations:  Keep all of your pain medications in a safe place, under lock and key, even if you live alone. We will NOT replace lost, stolen, or damaged medication.  After you fill your prescription, take 1 week's worth of pills and put them away in a safe place. You should keep a separate, properly labeled bottle for this purpose. The remainder should be kept in the original bottle. Use this as your primary supply, until it runs out. Once it's gone, then you know that you have 1 week's worth of medicine, and it is time to come  in for a prescription refill. If you do this correctly, it is unlikely that you will ever run out of medicine.  To make sure that the above recommendation works, it is very important that you make sure your medication refill appointments are scheduled at least 1 week before you run out of medicine. To do this in an effective manner, make sure that you do not leave the office without scheduling your next medication management appointment. Always ask the nursing staff to show you in your prescription , when your medication will be running out. Then arrange for the receptionist to get you a return appointment, at least 7 days before you run out of medicine. Do not wait until you have 1 or 2 pills left, to come in. This is very poor planning and does not take into consideration that we may need to cancel appointments due to bad weather, sickness, or emergencies affecting our staff.  DO NOT ACCEPT A "Partial Fill": If for any reason your pharmacy does not have enough pills/tablets to completely fill or refill your prescription, do not allow for a "partial fill". The law allows the pharmacy to complete that prescription within 72 hours, without requiring a new prescription. If they do not fill the rest of your prescription within those 72 hours, you will need a separate prescription to fill the remaining amount, which we will NOT provide. If the reason for the partial fill is your insurance, you will need to talk to the pharmacist about payment alternatives for the remaining tablets, but again, DO NOT ACCEPT A PARTIAL FILL, unless you can trust your pharmacist to obtain the remainder of the pills within 72 hours.  Prescription refills and/or changes in medication(s):     Prescription refills, and/or changes in dose or medication, will be conducted only during scheduled medication management appointments. (Applies to both, written and electronic prescriptions.)  No refills on procedure days. No medication will be  changed or started on procedure days. No changes, adjustments, and/or refills will be conducted on a procedure day. Doing so will interfere with the diagnostic portion of the procedure.  No phone refills. No medications will be "called into the pharmacy".  No Fax refills.  No weekend refills.  No Holliday refills.  No after hours refills.  Remember:  Business hours are:  Monday to Thursday 8:00 AM to 4:00 PM Provider's Schedule: Azalie Harbeck, MD - Appointments are:  Medication management: Monday and Wednesday 8:00 AM to 4:00 PM Procedure day: Tuesday and Thursday 7:30 AM to 4:00 PM Bilal Lateef, MD - Appointments are:  Medication management: Tuesday and Thursday 8:00 AM to 4:00 PM Procedure day: Monday and Wednesday 7:30 AM to 4:00 PM (Last update: 02/20/2020) ____________________________________________________________________________________________   ____________________________________________________________________________________________  CBD (cannabidiol) WARNING  Applicable to: All individuals currently taking or considering taking CBD (cannabidiol) and, more important, all patients taking opioid analgesic controlled substances (pain medication). (Example: oxycodone; oxymorphone; hydrocodone; hydromorphone; morphine; methadone; tramadol; tapentadol; fentanyl; buprenorphine; butorphanol; dextromethorphan; meperidine; codeine; etc.)  Legal status: CBD remains a Schedule I drug prohibited for any use. CBD is illegal with one exception. In the [place], CBD has a limited Food and Drug Administration (FDA) approval for the treatment of two specific types of epilepsy disorders. Only one CBD product has been approved by the FDA for this purpose: "Epidiolex". FDA is aware that some companies are marketing products containing cannabis and cannabis-derived compounds in ways that violate the Federal Food, Drug and Cosmetic Act (FD&C Act) and that may put the health and safety  of consumers at risk. The FDA, a Federal agency, has not enforced the CBD status since 2018.   Legality: Some manufacturers ship CBD products nationally, which is illegal. Often such products are sold online and are therefore available throughout the country. CBD is openly sold in head shops and health food stores in some states where such sales have not been explicitly legalized. Selling unapproved products with unsubstantiated therapeutic claims is not only a violation of the law, but also can put patients at risk, as these products have not been proven to be safe or effective. Federal illegality makes it difficult to conduct research on CBD.  Reference: "FDA Regulation of Cannabis and Cannabis-Derived Products, Including Cannabidiol (CBD)" - https://www.fda.gov/news-events/public-health-focus/fda-regulation-cannabis-and-cannabis-derived-products-including-cannabidiol-cbd  Warning: CBD is not FDA approved and has not undergo the same manufacturing controls as prescription drugs.  This means that the purity and safety of available CBD may be questionable. Most of the time, despite manufacturer's claims, it is contaminated with THC (delta-9-tetrahydrocannabinol - the chemical in marijuana responsible for the "HIGH").  When this is the case, the THC contaminant will trigger a positive urine drug screen (UDS) test for Marijuana (carboxy-THC). Because a positive UDS for any illicit substance is a violation of our medication agreement, your opioid analgesics (pain medicine) may be permanently discontinued.  MORE ABOUT CBD  General Information: CBD  is a derivative of the Marijuana (cannabis sativa) plant discovered in 1940. It is one of the 113 identified substances found in Marijuana. It accounts for up to 40% of the plant's extract. As of 2018, preliminary clinical studies on CBD included research for the treatment of anxiety, movement disorders, and pain. CBD is available and consumed in multiple forms,  including   inhalation of smoke or vapor, as an aerosol spray, and by mouth. It may be supplied as an oil containing CBD, capsules, dried cannabis, or as a liquid solution. CBD is thought not to be as psychoactive as THC (delta-9-tetrahydrocannabinol - the chemical in marijuana responsible for the "HIGH"). Studies suggest that CBD may interact with different biological target receptors in the body, including cannabinoid and other neurotransmitter receptors. As of 2018 the mechanism of action for its biological effects has not been determined.  Side-effects  Adverse reactions: Dry mouth, diarrhea, decreased appetite, fatigue, drowsiness, malaise, weakness, sleep disturbances, and others.  Drug interactions: CBC may interact with other medications such as blood-thinners. (Last update: 03/08/2020) ____________________________________________________________________________________________   ____________________________________________________________________________________________  Medication Rules  Purpose: To inform patients, and their family members, of our rules and regulations.  Applies to: All patients receiving prescriptions (written or electronic).  Pharmacy of record: Pharmacy where electronic prescriptions will be sent. If written prescriptions are taken to a different pharmacy, please inform the nursing staff. The pharmacy listed in the electronic medical record should be the one where you would like electronic prescriptions to be sent.  Electronic prescriptions: In compliance with the [place]rengthen Opioid Misuse Prevention (STOP) Act of 2017 (Session Law 2017-74/H243), effective August 02, 2018, all controlled substances must be electronically prescribed. Calling prescriptions to the pharmacy will cease to exist.  Prescription refills: Only during scheduled appointments. Applies to all prescriptions.  NOTE: The following applies primarily to controlled substances (Opioid*  Pain Medications).   Type of encounter (visit): For patients receiving controlled substances, face-to-face visits are required. (Not an option or up to the patient.)  Patient's responsibilities: 1. Pain Pills: Bring all pain pills to every appointment (except for procedure appointments). 2. Pill Bottles: Bring pills in original pharmacy bottle. Always bring the newest bottle. Bring bottle, even if empty. 3. Medication refills: You are responsible for knowing and keeping track of what medications you take and those you need refilled. The day before your appointment: write a list of all prescriptions that need to be refilled. The day of the appointment: give the list to the admitting nurse. Prescriptions will be written only during appointments. No prescriptions will be written on procedure days. If you forget a medication: it will not be "Called in", "Faxed", or "electronically sent". You will need to get another appointment to get these prescribed. No early refills. Do not call asking to have your prescription filled early. 4. Prescription Accuracy: You are responsible for carefully inspecting your prescriptions before leaving our office. Have the discharge nurse carefully go over each prescription with you, before taking them home. Make sure that your name is accurately spelled, that your address is correct. Check the name and dose of your medication to make sure it is accurate. Check the number of pills, and the written instructions to make sure they are clear and accurate. Make sure that you are given enough medication to last until your next medication refill appointment. 5. Taking Medication: Take medication as prescribed. When it comes to controlled substances, taking less pills or less frequently than prescribed is permitted and encouraged. Never take more pills than instructed. Never take medication more frequently than prescribed.  6. Inform other Doctors: Always inform, all of your  healthcare providers, of all the medications you take. 7. Pain Medication from other Providers: You are not allowed to accept any additional pain medication from any other Doctor or Healthcare provider. There are two exceptions to this rule. (see below) In   the event that you require additional pain medication, you are responsible for notifying us, as stated below. 8. Cough Medicine: Often these contain an opioid, such as codeine or hydrocodone. Never accept or take cough medicine containing these opioids if you are already taking an opioid* medication. The combination may cause respiratory failure and death. 9. Medication Agreement: You are responsible for carefully reading and following our Medication Agreement. This must be signed before receiving any prescriptions from our practice. Safely store a copy of your signed Agreement. Violations to the Agreement will result in no further prescriptions. (Additional copies of our Medication Agreement are available upon request.) 10. Laws, Rules, & Regulations: All patients are expected to follow all Federal and State Laws, Statutes, Rules, & Regulations. Ignorance of the Laws does not constitute a valid excuse.  11. Illegal drugs and Controlled Substances: The use of illegal substances (including, but not limited to marijuana and its derivatives) and/or the illegal use of any controlled substances is strictly prohibited. Violation of this rule may result in the immediate and permanent discontinuation of any and all prescriptions being written by our practice. The use of any illegal substances is prohibited. 12. Adopted CDC guidelines & recommendations: Target dosing levels will be at or below 60 MME/day. Use of benzodiazepines** is not recommended.  Exceptions: There are only two exceptions to the rule of not receiving pain medications from other Healthcare Providers. 1. Exception #1 (Emergencies): In the event of an emergency (i.e.: accident requiring emergency  care), you are allowed to receive additional pain medication. However, you are responsible for: As soon as you are able, call our office [phone], at any time of the day or night, and leave a message stating your name, the date and nature of the emergency, and the name and dose of the medication prescribed. In the event that your call is answered by a member of our staff, make sure to document and save the date, time, and the name of the person that took your information.  2. Exception #2 (Planned Surgery): In the event that you are scheduled by another doctor or dentist to have any type of surgery or procedure, you are allowed (for a period no longer than 30 days), to receive additional pain medication, for the acute post-op pain. However, in this case, you are responsible for picking up a copy of our "Post-op Pain Management for Surgeons" handout, and giving it to your surgeon or dentist. This document is available at our office, and does not require an appointment to obtain it. Simply go to our office during business hours (Monday-Thursday from 8:00 AM to 4:00 PM) (Friday 8:00 AM to 12:00 Noon) or if you have a scheduled appointment with us, prior to your surgery, and ask for it by name. In addition, you are responsible for: calling our office [phone], at any time of the day or night, and leaving a message stating your name, name of your surgeon, type of surgery, and date of procedure or surgery. Failure to comply with your responsibilities may result in termination of therapy involving the controlled substances.  *Opioid medications include: morphine, codeine, oxycodone, oxymorphone, hydrocodone, hydromorphone, meperidine, tramadol, tapentadol, buprenorphine, fentanyl, methadone. **Benzodiazepine medications include: diazepam (Valium), alprazolam (Xanax), clonazepam (Klonopine), lorazepam (Ativan), clorazepate (Tranxene), chlordiazepoxide (Librium), estazolam (Prosom), oxazepam (Serax),  temazepam (Restoril), triazolam (Halcion) (Last updated: 06/30/2020) ____________________________________________________________________________________________    

## 2020-11-05 NOTE — Progress Notes (Signed)
Nursing Pain Medication Assessment:  Safety precautions to be maintained throughout the outpatient stay will include: orient to surroundings, keep bed in low position, maintain call bell within reach at all times, provide assistance with transfer out of bed and ambulation.  Medication Inspection Compliance: Pill count conducted under aseptic conditions, in front of the patient. Neither the pills nor the bottle was removed from the patient's sight at any time. Once count was completed pills were immediately returned to the patient in their original bottle.  Medication: Tramadol (Ultram) Pill/Patch Count: 37 of 90 pills remain Pill/Patch Appearance: Markings consistent with prescribed medication Bottle Appearance: Standard pharmacy container. Clearly labeled. Filled Date: 2 / 25 / 2022 Last Medication intake:  Today

## 2020-11-06 ENCOUNTER — Other Ambulatory Visit: Payer: Self-pay | Admitting: Internal Medicine

## 2020-11-21 ENCOUNTER — Other Ambulatory Visit: Payer: Self-pay | Admitting: Internal Medicine

## 2020-11-27 ENCOUNTER — Other Ambulatory Visit: Payer: Self-pay | Admitting: Internal Medicine

## 2020-12-19 ENCOUNTER — Other Ambulatory Visit: Payer: Self-pay | Admitting: Internal Medicine

## 2021-01-02 ENCOUNTER — Other Ambulatory Visit: Payer: Self-pay | Admitting: Internal Medicine

## 2021-01-09 ENCOUNTER — Other Ambulatory Visit: Payer: Self-pay | Admitting: Internal Medicine

## 2021-01-13 ENCOUNTER — Ambulatory Visit (INDEPENDENT_AMBULATORY_CARE_PROVIDER_SITE_OTHER): Payer: Medicare Other | Admitting: Internal Medicine

## 2021-01-13 ENCOUNTER — Other Ambulatory Visit: Payer: Self-pay

## 2021-01-13 DIAGNOSIS — M47816 Spondylosis without myelopathy or radiculopathy, lumbar region: Secondary | ICD-10-CM | POA: Diagnosis not present

## 2021-01-13 DIAGNOSIS — R739 Hyperglycemia, unspecified: Secondary | ICD-10-CM | POA: Diagnosis not present

## 2021-01-13 DIAGNOSIS — I10 Essential (primary) hypertension: Secondary | ICD-10-CM | POA: Diagnosis not present

## 2021-01-13 DIAGNOSIS — Z85820 Personal history of malignant melanoma of skin: Secondary | ICD-10-CM | POA: Diagnosis not present

## 2021-01-13 DIAGNOSIS — M109 Gout, unspecified: Secondary | ICD-10-CM | POA: Diagnosis not present

## 2021-01-13 DIAGNOSIS — E039 Hypothyroidism, unspecified: Secondary | ICD-10-CM | POA: Diagnosis not present

## 2021-01-13 DIAGNOSIS — E7800 Pure hypercholesterolemia, unspecified: Secondary | ICD-10-CM

## 2021-01-13 DIAGNOSIS — Z86010 Personal history of colonic polyps: Secondary | ICD-10-CM

## 2021-01-13 DIAGNOSIS — Z8546 Personal history of malignant neoplasm of prostate: Secondary | ICD-10-CM

## 2021-01-13 DIAGNOSIS — K2270 Barrett's esophagus without dysplasia: Secondary | ICD-10-CM

## 2021-01-13 DIAGNOSIS — D72829 Elevated white blood cell count, unspecified: Secondary | ICD-10-CM | POA: Diagnosis not present

## 2021-01-13 NOTE — Progress Notes (Signed)
[name], male   DOB: 07-02-1935, 86 y.o.   MRN: [id number]   Subjective:    [name], male    DOB: [birth year], 86 y.o.   MRN: [id number]  HPI This visit occurred during the SARS-CoV-2 public health emergency.  Safety protocols were in place, including screening questions prior to the visit, additional usage of staff PPE, and extensive cleaning of exam room while observing appropriate contact time as indicated for disinfecting solutions.   Patient here for a scheduled follow up.  Here to follow up regarding his cholesterol and blood pressure.  Is followed by pain clinic for chronic back pain.  Takes tylenol/tramadol.  Sees Dr Bernardo Heater - s/p cystoscopy - right cystoscopy/retrograde pyelogram/stone extraction.  No chest pain or sob reported.  Breathing stable.  No abdominal pain.  Bowels moving.  Handling stress.  Overall feels things are stable.   Past Medical History:  Diagnosis Date   Acute postoperative pain 03/21/2018   Anemia    Arthritis    Barrett esophagus    Cancer ([place])    prostate,skin   Chicken pox    Diverticulitis    Dysrhythmia    GERD (gastroesophageal reflux disease)    Hyperlipidemia    Hypertension    Hypothyroidism    Melanoma ([place])    Malignant resection   Sleep apnea    Ulcer    Past Surgical History:  Procedure Laterality Date   CARDIAC CATHETERIZATION     Cataract Surgery Right 02/13/14   COLON SURGERY  2006-2008-2011   polyps removed   COLONOSCOPY W/ POLYPECTOMY     COLONOSCOPY WITH PROPOFOL N/A 02/15/2018   Procedure: COLONOSCOPY WITH PROPOFOL;  Surgeon: Manya Silvas, MD;  Location: [facility] ENDOSCOPY;  Service: Endoscopy;  Laterality: N/A;   COLONOSCOPY WITH PROPOFOL N/A 03/24/2020   Procedure: COLONOSCOPY WITH PROPOFOL;  Surgeon: Lesly Rubenstein, MD;  Location: [facility] ENDOSCOPY;  Service: Endoscopy;  Laterality: N/A;   cystocopy  2003   CYSTOSCOPY/RETROGRADE/URETEROSCOPY/STONE EXTRACTION WITH BASKET Right 10/28/2020    Procedure: CYSTOSCOPY/RETROGRADE PYELOGRAM/STONE EXTRACTION;  Surgeon: Abbie Sons, MD;  Location: [facility] ORS;  Service: Urology;  Laterality: Right;   ESOPHAGOGASTRODUODENOSCOPY (EGD) WITH PROPOFOL N/A 08/04/2016   Procedure: ESOPHAGOGASTRODUODENOSCOPY (EGD) WITH PROPOFOL;  Surgeon: Manya Silvas, MD;  Location: [facility] ENDOSCOPY;  Service: Endoscopy;  Laterality: N/A;   ESOPHAGOGASTRODUODENOSCOPY (EGD) WITH PROPOFOL N/A 03/24/2020   Procedure: ESOPHAGOGASTRODUODENOSCOPY (EGD) WITH PROPOFOL;  Surgeon: Lesly Rubenstein, MD;  Location: [facility] ENDOSCOPY;  Service: Endoscopy;  Laterality: N/A;   HEMORRHOID SURGERY     PROSTATE SURGERY     sleep study     Family History  Problem Relation Age of Onset   Liver disease Mother    Heart disease Father    Kidney disease Father    Social History   Socioeconomic History   Marital status: Married    Spouse name: Not on file   Number of children: Not on file   Years of education: Not on file   Highest education level: Not on file  Occupational History   Not on file  Tobacco Use   Smoking status: Never   Smokeless tobacco: Never  Vaping Use   Vaping Use: Never used  Substance and Sexual Activity   Alcohol use: No    Alcohol/week: 0.0 standard drinks   Drug use: No   Sexual activity: Never  Other Topics Concern   Not on file  Social History Narrative   Not on file  Social Determinants of Health   Financial Resource Strain: Low Risk    Difficulty of Paying Living Expenses: Not hard at all  Food Insecurity: No Food Insecurity   Worried About Charity fundraiser in the Last Year: Never true   [place] in the Last Year: Never true  Transportation Needs: No Transportation Needs   Lack of Transportation (Medical): No   Lack of Transportation (Non-Medical): No  Physical Activity: Not on file  Stress: No Stress Concern Present   Feeling of Stress : Not at all  Social Connections: Unknown   Frequency of Communication with  Friends and Family: Not on file   Frequency of Social Gatherings with Friends and Family: Not on file   Attends Religious Services: Not on file   Active Member of Clubs or Organizations: Not on file   Attends Archivist Meetings: Not on file   Marital Status: Married    Outpatient Encounter Medications as of 01/13/2021  Medication Sig   acetaminophen (TYLENOL) 500 MG tablet Take 500 mg by mouth every 6 (six) hours as needed.   allopurinol (ZYLOPRIM) 100 MG tablet TAKE 1 TABLET BY MOUTH DAILY (Patient taking differently: Take 100 mg by mouth daily.)   amLODipine (NORVASC) 10 MG tablet TAKE 1 TABLET BY MOUTH DAILY   aspirin 81 MG tablet Take 81 mg by mouth daily.   CALCIUM 600 1500 (600 Ca) MG TABS tablet TAKE 2 TABLETS BY MOUTH EVERY DAY WITH BREAKFAST (Patient taking differently: Take 2 tablets by mouth daily.)   fluticasone (FLONASE) 50 MCG/ACT nasal spray TWO PUFFS IN EACH NOSTRIL ONCE A DAY (Patient taking differently: Place 2 sprays into both nostrils daily.)   hydrALAZINE (APRESOLINE) 25 MG tablet TAKE ONE TABLET BY MOUTH THREE TIMES A DAY   hyoscyamine (LEVSIN, ANASPAZ) 0.125 MG tablet Take 0.125 mg by mouth as needed for bladder spasms.   irbesartan (AVAPRO) 300 MG tablet TAKE 1 TABLET BY MOUTH DAILY   L-Lysine 500 MG TABS Take 500 mg by mouth daily.   levothyroxine (SYNTHROID) 100 MCG tablet TAKE 1 TABLET BY MOUTH DAILY WITH BREAKFAST   omeprazole (PRILOSEC) 20 MG capsule TAKE ONE CAPSULE BY MOUTH TWICE A DAY   oxybutynin (DITROPAN) 5 MG tablet Take 1 tablet (5 mg total) by mouth 2 (two) times daily.   rosuvastatin (CRESTOR) 5 MG tablet TAKE 1 TABLET BY MOUTH DAILY   senna (SENOKOT) 8.6 MG TABS tablet Take 2 tablets by mouth daily.   traMADol (ULTRAM) 50 MG tablet Take 1 tablet (50 mg total) by mouth every 8 (eight) hours as needed for severe pain.   valACYclovir (VALTREX) 1000 MG tablet TAKE 1 TABLET BY MOUTH DAILY   No facility-administered encounter medications on file  as of 01/13/2021.     Review of Systems  Constitutional:  Negative for appetite change and unexpected weight change.  HENT:  Negative for congestion and sinus pressure.   Respiratory:  Negative for cough and chest tightness.        Breathing stable.   Cardiovascular:  Negative for chest pain and palpitations.  Gastrointestinal:  Negative for abdominal pain, diarrhea, nausea and vomiting.  Genitourinary:  Negative for difficulty urinating and dysuria.  Musculoskeletal:  Positive for back pain. Negative for joint swelling and myalgias.  Skin:  Negative for color change and rash.  Neurological:  Negative for dizziness, light-headedness and headaches.  Psychiatric/Behavioral:  Negative for agitation and dysphoric mood.       Objective:  Physical Exam Vitals reviewed.  Constitutional:      General: He is not in acute distress.    Appearance: Normal appearance. He is well-developed.  HENT:     Head: Normocephalic and atraumatic.     Right Ear: External ear normal.     Left Ear: External ear normal.  Eyes:     General: No scleral icterus.       Right eye: No discharge.        Left eye: No discharge.     Conjunctiva/sclera: Conjunctivae normal.  Cardiovascular:     Rate and Rhythm: Normal rate and regular rhythm.  Pulmonary:     Effort: Pulmonary effort is normal. No respiratory distress.     Breath sounds: Normal breath sounds.  Abdominal:     General: Bowel sounds are normal.     Palpations: Abdomen is soft.     Tenderness: There is no abdominal tenderness.  Musculoskeletal:        General: No swelling or tenderness.     Cervical back: Neck supple. No tenderness.  Lymphadenopathy:     Cervical: No cervical adenopathy.  Skin:    Findings: No erythema or rash.  Neurological:     Mental Status: He is alert.  Psychiatric:        Mood and Affect: Mood normal.        Behavior: Behavior normal.    BP 122/70   Pulse (!) 54   Temp 97.6 F (36.4 C)   Resp 16   Ht '5\' 8"'   (1.727 m)   Wt 237 lb (107.5 kg)   SpO2 97%   BMI 36.04 kg/m  Wt Readings from Last 3 Encounters:  01/13/21 237 lb (107.5 kg)  11/05/20 230 lb (104.3 kg)  10/23/20 230 lb (104.3 kg)     Lab Results  Component Value Date   WBC 11.9 (H) 10/18/2020   HGB 15.2 10/18/2020   HCT 45.3 10/18/2020   PLT 250 10/18/2020   GLUCOSE 112 (H) 10/18/2020   CHOL 132 10/14/2020   TRIG 184.0 (H) 10/14/2020   HDL 53.90 10/14/2020   LDLDIRECT 101.0 08/03/2018   LDLCALC 41 10/14/2020   ALT 13 10/18/2020   AST 16 10/18/2020   NA 137 10/18/2020   K 3.8 10/18/2020   CL 104 10/18/2020   CREATININE 1.77 (H) 10/18/2020   BUN 18 10/18/2020   CO2 23 10/18/2020   TSH 4.29 06/16/2020   PSA 0.00 (L) 06/16/2020   HGBA1C 5.3 10/14/2020    DG OR UROLOGY CYSTO IMAGE ([facility] ONLY)  Result Date: 10/28/2020 There is no interpretation for this exam.  This order is for images obtained during a surgical procedure.  Please See "Surgeries" Tab for more information regarding the procedure.       Assessment & Plan:   Problem List Items Addressed This Visit     Barrett's esophagus    No upper symptoms.  On prilosec.  Stable. Has been followed by GI.        History of colonic polyps    Colonoscopy 03/2020 - multiple polyps.  Recommended f/u in one year.        History of malignant melanoma    Followed by dermatology.         History of prostate cancer    S/p prostatectomy.  Followed by Dr Bernardo Heater.        Hypercholesterolemia    Off cholesterol medication.  Low cholesterol diet and exercise.  Follow lipid panel.  Relevant Orders   Hepatic function panel   Lipid panel   Hyperglycemia    Low carb diet and exercise.  Follow met b and a1c.        Relevant Orders   Hemoglobin A1c   Hypertension    Blood pressure as outlined.  Continue hydralazine, avapro and amlodipine.  Follow pressures.  Follow metabolic panel.        Relevant Orders   Basic metabolic panel   Hypothyroid    On  thyroid replacement.  Follow tsh.        Relevant Orders   TSH   Leukocytosis    Slightly elevated white blood cell count last check.  Check cbc next labs.        Relevant Orders   CBC with Differential/Platelet   Gout (Chronic)    Continue allopurinol.  Stable.        Osteoarthritis of lumbar spine (Chronic)    Has been followed by pain clinic.  Stable.        Severe obesity (BMI 35.0-39.9) with comorbidity (HCC) (Chronic)    Diet and exercise.  Follow.          Einar Pheasant, MD

## 2021-01-19 ENCOUNTER — Encounter: Payer: Self-pay | Admitting: Internal Medicine

## 2021-01-19 DIAGNOSIS — D72829 Elevated white blood cell count, unspecified: Secondary | ICD-10-CM | POA: Insufficient documentation

## 2021-01-19 NOTE — Assessment & Plan Note (Signed)
Followed by dermatology

## 2021-01-19 NOTE — Assessment & Plan Note (Signed)
Has been followed by pain clinic.  Stable.

## 2021-01-19 NOTE — Assessment & Plan Note (Signed)
Colonoscopy 03/2020 - multiple polyps.  Recommended f/u in one year.

## 2021-01-19 NOTE — Assessment & Plan Note (Signed)
Slightly elevated white blood cell count last check.  Check cbc next labs.

## 2021-01-19 NOTE — Assessment & Plan Note (Signed)
S/p prostatectomy.  Followed by Dr Stoioff.   

## 2021-01-19 NOTE — Assessment & Plan Note (Signed)
Continue allopurinol.  Stable.

## 2021-01-19 NOTE — Assessment & Plan Note (Signed)
No upper symptoms.  On prilosec.  Stable. Has been followed by GI.  

## 2021-01-19 NOTE — Assessment & Plan Note (Signed)
On thyroid replacement.  Follow tsh.  

## 2021-01-19 NOTE — Assessment & Plan Note (Signed)
Off cholesterol medication.  Low cholesterol diet and exercise.  Follow lipid panel.

## 2021-01-19 NOTE — Assessment & Plan Note (Signed)
Low carb diet and exercise.  Follow met b and a1c.  

## 2021-01-19 NOTE — Assessment & Plan Note (Signed)
Blood pressure as outlined.  Continue hydralazine, avapro and amlodipine.  Follow pressures.  Follow metabolic panel.

## 2021-01-19 NOTE — Assessment & Plan Note (Signed)
Diet and exercise.  Follow.  

## 2021-02-04 ENCOUNTER — Encounter: Payer: Medicare Other | Admitting: Pain Medicine

## 2021-02-05 ENCOUNTER — Other Ambulatory Visit: Payer: Self-pay

## 2021-02-05 ENCOUNTER — Other Ambulatory Visit (INDEPENDENT_AMBULATORY_CARE_PROVIDER_SITE_OTHER): Payer: Medicare Other

## 2021-02-05 DIAGNOSIS — I10 Essential (primary) hypertension: Secondary | ICD-10-CM | POA: Diagnosis not present

## 2021-02-05 DIAGNOSIS — D72829 Elevated white blood cell count, unspecified: Secondary | ICD-10-CM | POA: Diagnosis not present

## 2021-02-05 DIAGNOSIS — E039 Hypothyroidism, unspecified: Secondary | ICD-10-CM | POA: Diagnosis not present

## 2021-02-05 DIAGNOSIS — E7800 Pure hypercholesterolemia, unspecified: Secondary | ICD-10-CM

## 2021-02-05 DIAGNOSIS — R739 Hyperglycemia, unspecified: Secondary | ICD-10-CM

## 2021-02-05 LAB — LIPID PANEL
Cholesterol: 139 mg/dL (ref 0–200)
HDL: 52.2 mg/dL (ref >39.00)
LDL Cholesterol: 54 mg/dL (ref 0–99)
NonHDL: 86.51
Total CHOL/HDL Ratio: 3
Triglycerides: 163 mg/dL — ABNORMAL HIGH (ref 0.0–149.0)
VLDL: 32.6 mg/dL (ref 0.0–40.0)

## 2021-02-05 LAB — CBC WITH DIFFERENTIAL/PLATELET
Basophils Absolute: 0 10*3/uL (ref 0.0–0.1)
Basophils Relative: 0.6 % (ref 0.0–3.0)
Eosinophils Absolute: 0.1 10*3/uL (ref 0.0–0.7)
Eosinophils Relative: 1.4 % (ref 0.0–5.0)
HCT: 44.1 % (ref 39.0–52.0)
Hemoglobin: 14.9 g/dL (ref 13.0–17.0)
Lymphocytes Relative: 27.5 % (ref 12.0–46.0)
Lymphs Abs: 2 10*3/uL (ref 0.7–4.0)
MCHC: 33.7 g/dL (ref 30.0–36.0)
MCV: 93.6 fl (ref 78.0–100.0)
Monocytes Absolute: 0.7 10*3/uL (ref 0.1–1.0)
Monocytes Relative: 9.5 % (ref 3.0–12.0)
Neutro Abs: 4.5 10*3/uL (ref 1.4–7.7)
Neutrophils Relative %: 61 % (ref 43.0–77.0)
Platelets: 222 10*3/uL (ref 150.0–400.0)
RBC: 4.71 Mil/uL (ref 4.22–5.81)
RDW: 12.8 % (ref 11.5–15.5)
WBC: 7.4 10*3/uL (ref 4.0–10.5)

## 2021-02-05 LAB — BASIC METABOLIC PANEL
BUN: 14 mg/dL (ref 6–23)
CO2: 25 mEq/L (ref 19–32)
Calcium: 9.7 mg/dL (ref 8.4–10.5)
Chloride: 105 mEq/L (ref 96–112)
Creatinine, Ser: 1.32 mg/dL (ref 0.40–1.50)
GFR: 49.82 mL/min — ABNORMAL LOW (ref >60.00)
Glucose, Bld: 88 mg/dL (ref 70–99)
Potassium: 3.9 mEq/L (ref 3.5–5.1)
Sodium: 141 mEq/L (ref 135–145)

## 2021-02-05 LAB — HEPATIC FUNCTION PANEL
ALT: 14 U/L (ref 0–53)
AST: 17 U/L (ref 0–37)
Albumin: 4.2 g/dL (ref 3.5–5.2)
Alkaline Phosphatase: 60 U/L (ref 39–117)
Bilirubin, Direct: 0.1 mg/dL (ref 0.0–0.3)
Total Bilirubin: 0.6 mg/dL (ref 0.2–1.2)
Total Protein: 6.7 g/dL (ref 6.0–8.3)

## 2021-02-05 LAB — TSH: TSH: 3.04 u[IU]/mL (ref 0.35–5.50)

## 2021-02-05 LAB — HEMOGLOBIN A1C: Hgb A1c MFr Bld: 5.3 % (ref 4.6–6.5)

## 2021-02-09 NOTE — Progress Notes (Signed)
PROVIDER NOTE: Information contained herein reflects review and annotations entered in association with encounter. Interpretation of such information and data should be left to medically-trained personnel. Information provided to patient can be located elsewhere in the medical record under "Patient Instructions". Document created using STT-dictation technology, any transcriptional errors that may result from process are unintentional.    Patient: Hector [name]  Service Category: E/M  Provider: Gaspar Cola, MD  DOB: 1935/01/01  DOS: 02/10/2021  Specialty: Interventional Pain Management  MRN: [id number]  Setting: Ambulatory outpatient  PCP: Einar Pheasant, MD  Type: Established Patient    Referring Provider: Einar Pheasant, MD  Location: Office  Delivery: Face-to-face     HPI  [name], a 86 y.o. year old male, is here today because of his Chronic pain syndrome [G89.4]. [name] primary complain today is Back Pain Last encounter: My last encounter with him was on 11/05/2020. Pertinent problems: [name] has History of prostate cancer; Shoulder pain, left; Chronic hip pain (Left); Chronic low back pain (1ry area of Pain) (Bilateral) (L>R) w/ sciatica (Left); Chronic pain syndrome; Polyarthritis, unspecified (Secondary Area of Pain); L5-S1 bilateral pars defect with spondylolisthesis; Lumbosacral Grade 1 Anterolisthesis of L5 over S1; Lumbar facet arthropathy (Bilateral); Lumbar foraminal stenosis (L5-S1) (Bilateral) (L>R); Chronic Lumbar radiculitis (L5) (Left); Chronic musculoskeletal pain; Lumbar facet syndrome (Bilateral) (L>R); Chronic lower extremity pain (Secondary Area of Pain) (Left); Chronic lower extremity radicular pain (L5) (Left); Lumbar facet osteoarthritis (Bilateral); Osteoarthritis of lumbar spine; Lumbar spondylosis; Spondylosis without myelopathy or radiculopathy, lumbosacral region; Chronic right shoulder pain; DDD (degenerative disc disease), lumbosacral; Gout; and Chronic  low back pain (Bilateral) w/o sciatica on their pertinent problem list. Pain Assessment: Severity of Chronic pain is reported as a 2 /10. Location: Back  /pain radiaties down leg at night. Onset: More than a month ago. Quality: Burning, Aching. Timing: Intermittent. Modifying factor(s): pain meds, sit down. Vitals:  height is '5\' 8"'  (1.727 m) and weight is 228 lb (103.4 kg). His temperature is 98.2 F (36.8 C). His blood pressure is 170/69 (abnormal) and his pulse is 70. His oxygen saturation is 98%.   Reason for encounter: medication management.   The patient indicates doing well with the current medication regimen. No adverse reactions or side effects reported to the medications.  He refers beginning to again experience left-sided low back pain with prolonged standing which typically refers pain through the back of the leg all the way down to the ankle.  He refers that the pain seems to be bilateral but worse on the left.  Today I have offered to schedule him for a lumbar facet block, but he indicated that he is not ready yet but he will call when he needs it.  RTCB: 08/09/2021 Nonopioids transferred 05/07/2020: Calcium and vitamin D3  Pharmacotherapy Assessment  Analgesic: Tramadol 50 mg, 1 tab PO q 8 hrs (150 mg/day of tramadol) MME/day: 15 mg/day.   Monitoring: [place] PMP: PDMP reviewed during this encounter.       Pharmacotherapy: No side-effects or adverse reactions reported. Compliance: No problems identified. Effectiveness: Clinically acceptable.  Hector Fischer, RN  02/10/2021  3:12 PM  Sign when Signing Visit Nursing Pain Medication Assessment:  Safety precautions to be maintained throughout the outpatient stay will include: orient to surroundings, keep bed in low position, maintain call bell within reach at all times, provide assistance with transfer out of bed and ambulation.  Medication Inspection Compliance: Pill count conducted under aseptic conditions, in front  of the patient. Neither  the pills nor the bottle was removed from the patient's sight at any time. Once count was completed pills were immediately returned to the patient in their original bottle.  Medication: Tramadol (Ultram) Pill/Patch Count:  42 of 90 pills remain Pill/Patch Appearance: Markings consistent with prescribed medication Bottle Appearance: Standard pharmacy container. Clearly labeled. Filled Date: 6/ 24/ 2022 Last Medication intake:  Today Safety precautions to be maintained throughout the outpatient stay will include: orient to surroundings, keep bed in low position, maintain call bell within reach at all times, provide assistance with transfer out of bed and ambulation.      UDS:  Summary  Date Value Ref Range Status  05/07/2020 Note  Final    Comment:    ==================================================================== [employer] Select 13 (MW) ==================================================================== Test                             Result       Flag       Units  Drug Present and Declared for Prescription Verification   Tramadol                       >1825        EXPECTED   ng/mg creat   O-Desmethyltramadol            1023         EXPECTED   ng/mg creat   N-Desmethyltramadol            >1825        EXPECTED   ng/mg creat    Source of tramadol is a prescription medication. O-desmethyltramadol    and N-desmethyltramadol are expected metabolites of tramadol.  ==================================================================== Test                      Result    Flag   Units      Ref Range   Creatinine              274              mg/dL      >=20 ==================================================================== Declared Medications:  The flagging and interpretation on this report are based on the  following declared medications.  Unexpected results may arise from  inaccuracies in the declared medications.   **Note: The testing scope of this panel includes these  medications:   Tramadol (Ultram)   **Note: The testing scope of this panel does not include the  following reported medications:   Acetaminophen (Tylenol)  Allopurinol (Zyloprim)  Amlodipine (Norvasc)  Aspirin  Calcium  Cholecalciferol  Fluticasone (Flonase)  Hydralazine (Apresoline)  Hyoscyamine  Irbesartan (Avapro)  Levothyroxine (Synthroid)  Lysine  Omeprazole (Prilosec)  Rosuvastatin (Crestor)  Sennosides  Valacyclovir (Valtrex) ==================================================================== For clinical consultation, please call [phone]. ====================================================================      ROS  Constitutional: Denies any fever or chills Gastrointestinal: No reported hemesis, hematochezia, vomiting, or acute GI distress Musculoskeletal: Denies any acute onset joint swelling, redness, loss of ROM, or weakness Neurological: No reported episodes of acute onset apraxia, aphasia, dysarthria, agnosia, amnesia, paralysis, loss of coordination, or loss of consciousness  Medication Review  acetaminophen, allopurinol, amLODipine, aspirin, calcium carbonate, fluticasone, hydrALAZINE, irbesartan, levothyroxine, omeprazole, rosuvastatin, senna, traMADol, and valACYclovir  History Review  Allergy: Mr. Anne is allergic to tape. Drug: Mr. Heard  reports no history of drug use. Alcohol:  reports no history of  alcohol use. Tobacco:  reports that he has never smoked. He has never used smokeless tobacco. Social: Mr. Biondolillo  reports that he has never smoked. He has never used smokeless tobacco. He reports that he does not drink alcohol and does not use drugs. Medical:  has a past medical history of Acute postoperative pain (03/21/2018), Anemia, Arthritis, Barrett esophagus, Cancer ([place]), Chicken pox, Diverticulitis, Dysrhythmia, GERD (gastroesophageal reflux disease), Hyperlipidemia, Hypertension, Hypothyroidism, Melanoma ([place]), Sleep apnea, and  Ulcer. Surgical: Mr. Tarnowski  has a past surgical history that includes Prostate surgery; cystocopy (2003); Hemorrhoid surgery; Cardiac catheterization; sleep study; Colon surgery (2006-2008-2011); Cataract Surgery (Right, 02/13/14); Esophagogastroduodenoscopy (egd) with propofol (N/A, 08/04/2016); Colonoscopy w/ polypectomy; Colonoscopy with propofol (N/A, 02/15/2018); Colonoscopy with propofol (N/A, 03/24/2020); Esophagogastroduodenoscopy (egd) with propofol (N/A, 03/24/2020); and Cystoscopy/retrograde/ureteroscopy/stone extraction with basket (Right, 10/28/2020). Family: family history includes Heart disease in his father; Kidney disease in his father; Liver disease in his mother.  Laboratory Chemistry Profile   Renal Lab Results  Component Value Date   BUN 14 02/05/2021   CREATININE 1.32 02/05/2021   BCR 10 06/13/2017   GFR 49.82 (L) 02/05/2021   GFRAA >60 01/23/2018   GFRNONAA 38 (L) 10/18/2020    Hepatic Lab Results  Component Value Date   AST 17 02/05/2021   ALT 14 02/05/2021   ALBUMIN 4.2 02/05/2021   ALKPHOS 60 02/05/2021   LIPASE 27 10/18/2020    Electrolytes Lab Results  Component Value Date   NA 141 02/05/2021   K 3.9 02/05/2021   CL 105 02/05/2021   CALCIUM 9.7 02/05/2021   MG 2.0 06/13/2017    Bone Lab Results  Component Value Date   VD25OH 21.46 (L) 03/28/2018   25OHVITD1 20 (L) 06/13/2017   25OHVITD2 <1.0 06/13/2017   25OHVITD3 20 06/13/2017    Inflammation (CRP: Acute Phase) (ESR: Chronic Phase) Lab Results  Component Value Date   CRP 2.8 06/13/2017   ESRSEDRATE 13 06/13/2017          Note: Above Lab results reviewed.  Recent Imaging Review  DG OR UROLOGY CYSTO IMAGE ([place]) There is no interpretation for this exam.    This order is for images obtained during a surgical procedure.  Please See  "Surgeries" Tab for more information regarding the procedure. Note: Reviewed        Physical Exam  General appearance: Well nourished, well developed, and  well hydrated. In no apparent acute distress Mental status: Alert, oriented x 3 (person, place, & time)       Respiratory: No evidence of acute respiratory distress Eyes: PERLA Vitals: BP (!) 170/69   Pulse 70   Temp 98.2 F (36.8 C)   Ht '5\' 8"'  (1.727 m)   Wt 228 lb (103.4 kg)   SpO2 98%   BMI 34.67 kg/m  BMI: Estimated body mass index is 34.67 kg/m as calculated from the following:   Height as of this encounter: '5\' 8"'  (1.727 m).   Weight as of this encounter: 228 lb (103.4 kg). Ideal: Ideal body weight: 68.4 kg (150 lb 12.7 oz) Adjusted ideal body weight: 82.4 kg (181 lb 10.8 oz)  Assessment   Status Diagnosis  Controlled Controlled Controlled 1. Chronic pain syndrome   2. Lumbar facet syndrome (Bilateral) (L>R)   3. Lumbar facet arthropathy (Bilateral)   4. Chronic low back pain (Bilateral) w/o sciatica   5. Pharmacologic therapy   6. Chronic use of opiate for therapeutic purpose   7. Uncomplicated opioid dependence ([place])  8. Encounter for medication management      Updated Problems: No problems updated.  Plan of Care  Problem-specific:  No problem-specific Assessment & Plan notes found for this encounter.  Mr. ONESIMO LINGARD has a current medication list which includes the following long-term medication(s): allopurinol, amlodipine, calcium 600, fluticasone, hydralazine, irbesartan, levothyroxine, omeprazole, rosuvastatin, and tramadol.  Pharmacotherapy (Medications Ordered): Meds ordered this encounter  Medications   traMADol (ULTRAM) 50 MG tablet    Sig: Take 1 tablet (50 mg total) by mouth every 8 (eight) hours as needed for severe pain.    Dispense:  270 tablet    Refill:  0    Not a duplicate. Do NOT delete! Dispense 1 day early if closed on fill date. Warn not to take CNS-depressants 8 hours before or after taking opioid. Do not send refill request. Renewal requires appointment.    Orders:  Orders Placed This Encounter  Procedures   LUMBAR FACET(MEDIAL  BRANCH NERVE BLOCK) MBNB    Standing Status:   Standing    Number of Occurrences:   1    Standing Expiration Date:   05/13/2021    Scheduling Instructions:     Procedure: Lumbar facet block (AKA.: Lumbosacral medial branch nerve block)     Side: Bilateral     Level: L3-4, L4-5, & L5-S1 Facets (L2, L3, L4, L5, & S1 Medial Branch Nerves)     Sedation: Patient's choice.     Timeframe: Patient will call.    Order Specific Question:   Where will this procedure be performed?    Answer:   [facility]   [employer] Select 13 (MW), Urine    Volume: 30 ml(s). Minimum 3 ml of urine is needed. Document temperature of fresh sample. Indications: Long term (current) use of opiate analgesic (H82.993)    Order Specific Question:   Release to patient    Answer:   Immediate    Follow-up plan:   Return in about 6 months (around 08/09/2021) for evaluation day (F2F) (MM), in addition, PRN Procedure (w/ sedation): (B) L-FCT Blk.     Interventional Therapies  Risk  Complexity Considerations:   POOR Candidate for any more RFA due to movement and body habitus.   Planned  Pending:   Pending further evaluation   Under consideration:   Diagnostic left L5 TFESI  Possible left L5 nerve root ganglion RFA  Diagnostic left L4-5 LESI    Completed:   Diagnostic midline caudal ESI x2 (07/03/2019) Palliative right lumbar facet block x3 (08/07/2020) Palliative left lumbar facet block x2 (10/27/2017) Palliative right lumbar facet RFA x1 (04/18/2018)  Palliative left lumbar facet RFA x1 (03/21/2018)    Therapeutic  Palliative (PRN) options:   Diagnostic midline caudal ESI #3  Palliative right lumbar facet block #4  Palliative left lumbar facet block #3  Palliative right lumbar facet RFA #2  Palliative left lumbar facet RFA #2       Recent Visits No visits were found meeting these conditions. Showing recent visits within past 90 days and meeting all other requirements Today's Visits Date Type Provider  Dept  02/10/21 Office Visit Milinda Pointer, MD [facility]  Showing today's visits and meeting all other requirements Future Appointments No visits were found meeting these conditions. Showing future appointments within next 90 days and meeting all other requirements I discussed the assessment and treatment plan with the patient. The patient was provided an opportunity to ask questions and all were answered. The patient agreed with  the plan and demonstrated an understanding of the instructions.  Patient advised to call back or seek an in-person evaluation if the symptoms or condition worsens.  Duration of encounter: 30 minutes.  Note by: Gaspar Cola, MD Date: 02/10/2021; Time: 4:06 PM

## 2021-02-10 ENCOUNTER — Ambulatory Visit: Payer: Medicare Other | Attending: Pain Medicine | Admitting: Pain Medicine

## 2021-02-10 ENCOUNTER — Other Ambulatory Visit: Payer: Self-pay

## 2021-02-10 ENCOUNTER — Encounter: Payer: Self-pay | Admitting: Pain Medicine

## 2021-02-10 VITALS — BP 170/69 | HR 70 | Temp 98.2°F | Ht 68.0 in | Wt 228.0 lb

## 2021-02-10 DIAGNOSIS — M545 Low back pain, unspecified: Secondary | ICD-10-CM | POA: Diagnosis not present

## 2021-02-10 DIAGNOSIS — G8929 Other chronic pain: Secondary | ICD-10-CM | POA: Diagnosis not present

## 2021-02-10 DIAGNOSIS — Z79899 Other long term (current) drug therapy: Secondary | ICD-10-CM

## 2021-02-10 DIAGNOSIS — G894 Chronic pain syndrome: Secondary | ICD-10-CM | POA: Diagnosis not present

## 2021-02-10 DIAGNOSIS — M47816 Spondylosis without myelopathy or radiculopathy, lumbar region: Secondary | ICD-10-CM | POA: Diagnosis not present

## 2021-02-10 DIAGNOSIS — F1120 Opioid dependence, uncomplicated: Secondary | ICD-10-CM

## 2021-02-10 DIAGNOSIS — Z79891 Long term (current) use of opiate analgesic: Secondary | ICD-10-CM

## 2021-02-10 MED ORDER — TRAMADOL HCL 50 MG PO TABS: 50.0000 mg | ORAL_TABLET | Freq: Three times a day (TID) | ORAL | 0 refills | Status: DC | PRN

## 2021-02-10 NOTE — Patient Instructions (Signed)
____________________________________________________________________________________________  Medication Rules  Purpose: To inform patients, and their family members, of our rules and regulations.  Applies to: All patients receiving prescriptions (written or electronic).  Pharmacy of record: Pharmacy where electronic prescriptions will be sent. If written prescriptions are taken to a different pharmacy, please inform the nursing staff. The pharmacy listed in the electronic medical record should be the one where you would like electronic prescriptions to be sent.  Electronic prescriptions: In compliance with the [place] Strengthen Opioid Misuse Prevention (STOP) Act of 2017 (Session Law 2017-74/H243), effective August 02, 2018, all controlled substances must be electronically prescribed. Calling prescriptions to the pharmacy will cease to exist.  Prescription refills: Only during scheduled appointments. Applies to all prescriptions.  NOTE: The following applies primarily to controlled substances (Opioid* Pain Medications).   Type of encounter (visit): For patients receiving controlled substances, face-to-face visits are required. (Not an option or up to the patient.)  Patient's responsibilities: Pain Pills: Bring all pain pills to every appointment (except for procedure appointments). Pill Bottles: Bring pills in original pharmacy bottle. Always bring the newest bottle. Bring bottle, even if empty. Medication refills: You are responsible for knowing and keeping track of what medications you take and those you need refilled. The day before your appointment: write a list of all prescriptions that need to be refilled. The day of the appointment: give the list to the admitting nurse. Prescriptions will be written only during appointments. No prescriptions will be written on procedure days. If you forget a medication: it will not be "Called in", "Faxed", or "electronically sent". You will  need to get another appointment to get these prescribed. No early refills. Do not call asking to have your prescription filled early. Prescription Accuracy: You are responsible for carefully inspecting your prescriptions before leaving our office. Have the discharge nurse carefully go over each prescription with you, before taking them home. Make sure that your name is accurately spelled, that your address is correct. Check the name and dose of your medication to make sure it is accurate. Check the number of pills, and the written instructions to make sure they are clear and accurate. Make sure that you are given enough medication to last until your next medication refill appointment. Taking Medication: Take medication as prescribed. When it comes to controlled substances, taking less pills or less frequently than prescribed is permitted and encouraged. Never take more pills than instructed. Never take medication more frequently than prescribed.  Inform other Doctors: Always inform, all of your healthcare providers, of all the medications you take. Pain Medication from other Providers: You are not allowed to accept any additional pain medication from any other Doctor or Healthcare provider. There are two exceptions to this rule. (see below) In the event that you require additional pain medication, you are responsible for notifying us, as stated below. Cough Medicine: Often these contain an opioid, such as codeine or hydrocodone. Never accept or take cough medicine containing these opioids if you are already taking an opioid* medication. The combination may cause respiratory failure and death. Medication Agreement: You are responsible for carefully reading and following our Medication Agreement. This must be signed before receiving any prescriptions from our practice. Safely store a copy of your signed Agreement. Violations to the Agreement will result in no further prescriptions. (Additional copies of our  Medication Agreement are available upon request.) Laws, Rules, & Regulations: All patients are expected to follow all Federal and State Laws, Statutes, Rules, & Regulations. Ignorance of   the Laws does not constitute a valid excuse.  Illegal drugs and Controlled Substances: The use of illegal substances (including, but not limited to marijuana and its derivatives) and/or the illegal use of any controlled substances is strictly prohibited. Violation of this rule may result in the immediate and permanent discontinuation of any and all prescriptions being written by our practice. The use of any illegal substances is prohibited. Adopted CDC guidelines & recommendations: Target dosing levels will be at or below 60 MME/day. Use of benzodiazepines** is not recommended.  Exceptions: There are only two exceptions to the rule of not receiving pain medications from other Healthcare Providers. Exception #1 (Emergencies): In the event of an emergency (i.e.: accident requiring emergency care), you are allowed to receive additional pain medication. However, you are responsible for: As soon as you are able, call our office [phone], at any time of the day or night, and leave a message stating your name, the date and nature of the emergency, and the name and dose of the medication prescribed. In the event that your call is answered by a member of our staff, make sure to document and save the date, time, and the name of the person that took your information.  Exception #2 (Planned Surgery): In the event that you are scheduled by another doctor or dentist to have any type of surgery or procedure, you are allowed (for a period no longer than 30 days), to receive additional pain medication, for the acute post-op pain. However, in this case, you are responsible for picking up a copy of our "Post-op Pain Management for Surgeons" handout, and giving it to your surgeon or dentist. This document is available at our office, and  does not require an appointment to obtain it. Simply go to our office during business hours (Monday-Thursday from 8:00 AM to 4:00 PM) (Friday 8:00 AM to 12:00 Noon) or if you have a scheduled appointment with us, prior to your surgery, and ask for it by name. In addition, you are responsible for: calling our office [phone], at any time of the day or night, and leaving a message stating your name, name of your surgeon, type of surgery, and date of procedure or surgery. Failure to comply with your responsibilities may result in termination of therapy involving the controlled substances.  *Opioid medications include: morphine, codeine, oxycodone, oxymorphone, hydrocodone, hydromorphone, meperidine, tramadol, tapentadol, buprenorphine, fentanyl, methadone. **Benzodiazepine medications include: diazepam (Valium), alprazolam (Xanax), clonazepam (Klonopine), lorazepam (Ativan), clorazepate (Tranxene), chlordiazepoxide (Librium), estazolam (Prosom), oxazepam (Serax), temazepam (Restoril), triazolam (Halcion) (Last updated: 06/30/2020) ____________________________________________________________________________________________  ____________________________________________________________________________________________  Medication Recommendations and Reminders  Applies to: All patients receiving prescriptions (written and/or electronic).  Medication Rules & Regulations: These rules and regulations exist for your safety and that of others. They are not flexible and neither are we. Dismissing or ignoring them will be considered "non-compliance" with medication therapy, resulting in complete and irreversible termination of such therapy. (See document titled "Medication Rules" for more details.) In all conscience, because of safety reasons, we cannot continue providing a therapy where the patient does not follow instructions.  Pharmacy of record:  Definition: This is the pharmacy where your electronic  prescriptions will be sent.  We do not endorse any particular pharmacy, however, we have experienced problems with [facility] not securing enough medication supply for the community. We do not restrict you in your choice of pharmacy. However, once we write for your prescriptions, we will NOT be re-sending more prescriptions to fix restricted supply problems   created by your pharmacy, or your insurance.  The pharmacy listed in the electronic medical record should be the one where you want electronic prescriptions to be sent. If you choose to change pharmacy, simply notify our nursing staff.  Recommendations: Keep all of your pain medications in a safe place, under lock and key, even if you live alone. We will NOT replace lost, stolen, or damaged medication. After you fill your prescription, take 1 week's worth of pills and put them away in a safe place. You should keep a separate, properly labeled bottle for this purpose. The remainder should be kept in the original bottle. Use this as your primary supply, until it runs out. Once it's gone, then you know that you have 1 week's worth of medicine, and it is time to come in for a prescription refill. If you do this correctly, it is unlikely that you will ever run out of medicine. To make sure that the above recommendation works, it is very important that you make sure your medication refill appointments are scheduled at least 1 week before you run out of medicine. To do this in an effective manner, make sure that you do not leave the office without scheduling your next medication management appointment. Always ask the nursing staff to show you in your prescription , when your medication will be running out. Then arrange for the receptionist to get you a return appointment, at least 7 days before you run out of medicine. Do not wait until you have 1 or 2 pills left, to come in. This is very poor planning and does not take into consideration that we may need to  cancel appointments due to bad weather, sickness, or emergencies affecting our staff. DO NOT ACCEPT A "Partial Fill": If for any reason your pharmacy does not have enough pills/tablets to completely fill or refill your prescription, do not allow for a "partial fill". The law allows the pharmacy to complete that prescription within 72 hours, without requiring a new prescription. If they do not fill the rest of your prescription within those 72 hours, you will need a separate prescription to fill the remaining amount, which we will NOT provide. If the reason for the partial fill is your insurance, you will need to talk to the pharmacist about payment alternatives for the remaining tablets, but again, DO NOT ACCEPT A PARTIAL FILL, unless you can trust your pharmacist to obtain the remainder of the pills within 72 hours.  Prescription refills and/or changes in medication(s):  Prescription refills, and/or changes in dose or medication, will be conducted only during scheduled medication management appointments. (Applies to both, written and electronic prescriptions.) No refills on procedure days. No medication will be changed or started on procedure days. No changes, adjustments, and/or refills will be conducted on a procedure day. Doing so will interfere with the diagnostic portion of the procedure. No phone refills. No medications will be "called into the pharmacy". No Fax refills. No weekend refills. No Holliday refills. No after hours refills.  Remember:  Business hours are:  Monday to Thursday 8:00 AM to 4:00 PM Provider's Schedule: Hector Dayton, MD - Appointments are:  Medication management: Monday and Wednesday 8:00 AM to 4:00 PM Procedure day: Tuesday and Thursday 7:30 AM to 4:00 PM Hector Lateef, MD - Appointments are:  Medication management: Tuesday and Thursday 8:00 AM to 4:00 PM Procedure day: Monday and Wednesday 7:30 AM to 4:00 PM (Last update:  02/20/2020) ____________________________________________________________________________________________  ____________________________________________________________________________________________  CBD (cannabidiol) WARNING    Applicable to: All individuals currently taking or considering taking CBD (cannabidiol) and, more important, all patients taking opioid analgesic controlled substances (pain medication). (Example: oxycodone; oxymorphone; hydrocodone; hydromorphone; morphine; methadone; tramadol; tapentadol; fentanyl; buprenorphine; butorphanol; dextromethorphan; meperidine; codeine; etc.)  Legal status: CBD remains a Schedule I drug prohibited for any use. CBD is illegal with one exception. In the [place], CBD has a limited Food and Drug Administration (FDA) approval for the treatment of two specific types of epilepsy disorders. Only one CBD product has been approved by the FDA for this purpose: "Epidiolex". FDA is aware that some companies are marketing products containing cannabis and cannabis-derived compounds in ways that violate the Federal Food, Drug and Cosmetic Act (FD&C Act) and that may put the health and safety of consumers at risk. The FDA, a Federal agency, has not enforced the CBD status since 2018.   Legality: Some manufacturers ship CBD products nationally, which is illegal. Often such products are sold online and are therefore available throughout the country. CBD is openly sold in head shops and health food stores in some states where such sales have not been explicitly legalized. Selling unapproved products with unsubstantiated therapeutic claims is not only a violation of the law, but also can put patients at risk, as these products have not been proven to be safe or effective. Federal illegality makes it difficult to conduct research on CBD.  Reference: "FDA Regulation of Cannabis and Cannabis-Derived Products, Including Cannabidiol (CBD)" -  https://www.fda.gov/news-events/public-health-focus/fda-regulation-cannabis-and-cannabis-derived-products-including-cannabidiol-cbd  Warning: CBD is not FDA approved and has not undergo the same manufacturing controls as prescription drugs.  This means that the purity and safety of available CBD may be questionable. Most of the time, despite manufacturer's claims, it is contaminated with THC (delta-9-tetrahydrocannabinol - the chemical in marijuana responsible for the "HIGH").  When this is the case, the THC contaminant will trigger a positive urine drug screen (UDS) test for Marijuana (carboxy-THC). Because a positive UDS for any illicit substance is a violation of our medication agreement, your opioid analgesics (pain medicine) may be permanently discontinued.  MORE ABOUT CBD  General Information: CBD  is a derivative of the Marijuana (cannabis sativa) plant discovered in 1940. It is one of the 113 identified substances found in Marijuana. It accounts for up to 40% of the plant's extract. As of 2018, preliminary clinical studies on CBD included research for the treatment of anxiety, movement disorders, and pain. CBD is available and consumed in multiple forms, including inhalation of smoke or vapor, as an aerosol spray, and by mouth. It may be supplied as an oil containing CBD, capsules, dried cannabis, or as a liquid solution. CBD is thought not to be as psychoactive as THC (delta-9-tetrahydrocannabinol - the chemical in marijuana responsible for the "HIGH"). Studies suggest that CBD may interact with different biological target receptors in the body, including cannabinoid and other neurotransmitter receptors. As of 2018 the mechanism of action for its biological effects has not been determined.  Side-effects  Adverse reactions: Dry mouth, diarrhea, decreased appetite, fatigue, drowsiness, malaise, weakness, sleep disturbances, and others.  Drug interactions: CBC may interact with other medications  such as blood-thinners. (Last update: 03/08/2020) ____________________________________________________________________________________________  ____________________________________________________________________________________________  Drug Holidays (Slow)  What is a "Drug Holiday"? Drug Holiday: is the name given to the period of time during which a patient stops taking a medication(s) for the purpose of eliminating tolerance to the drug.  Benefits Improved effectiveness of opioids. Decreased opioid dose needed to achieve benefits. Improved pain with lesser dose.    What is tolerance? Tolerance: is the progressive decreased in effectiveness of a drug due to its repetitive use. With repetitive use, the body gets use to the medication and as a consequence, it loses its effectiveness. This is a common problem seen with opioid pain medications. As a result, a larger dose of the drug is needed to achieve the same effect that used to be obtained with a smaller dose.  How long should a "Drug Holiday" last? You should stay off of the pain medicine for at least 14 consecutive days. (2 weeks)  Should I stop the medicine "cold turkey"? No. You should always coordinate with your Pain Specialist so that he/she can provide you with the correct medication dose to make the transition as smoothly as possible.  How do I stop the medicine? Slowly. You will be instructed to decrease the daily amount of pills that you take by one (1) pill every seven (7) days. This is called a "slow downward taper" of your dose. For example: if you normally take four (4) pills per day, you will be asked to drop this dose to three (3) pills per day for seven (7) days, then to two (2) pills per day for seven (7) days, then to one (1) per day for seven (7) days, and at the end of those last seven (7) days, this is when the "Drug Holiday" would start.   Will I have withdrawals? By doing a "slow downward taper" like this one, it  is unlikely that you will experience any significant withdrawal symptoms. Typically, what triggers withdrawals is the sudden stop of a high dose opioid therapy. Withdrawals can usually be avoided by slowly decreasing the dose over a prolonged period of time. If you do not follow these instructions and decide to stop your medication abruptly, withdrawals may be possible.  What are withdrawals? Withdrawals: refers to the wide range of symptoms that occur after stopping or dramatically reducing opiate drugs after heavy and prolonged use. Withdrawal symptoms do not occur to patients that use low dose opioids, or those who take the medication sporadically. Contrary to benzodiazepine (example: Valium, Xanax, etc.) or alcohol withdrawals ("Delirium Tremens"), opioid withdrawals are not lethal. Withdrawals are the physical manifestation of the body getting rid of the excess receptors.  Expected Symptoms Early symptoms of withdrawal may include: Agitation Anxiety Muscle aches Increased tearing Insomnia Runny nose Sweating Yawning  Late symptoms of withdrawal may include: Abdominal cramping Diarrhea Dilated pupils Goose bumps Nausea Vomiting  Will I experience withdrawals? Due to the slow nature of the taper, it is very unlikely that you will experience any.  What is a slow taper? Taper: refers to the gradual decrease in dose.  (Last update: 02/20/2020) ____________________________________________________________________________________________    

## 2021-02-10 NOTE — Progress Notes (Signed)
Nursing Pain Medication Assessment:  Safety precautions to be maintained throughout the outpatient stay will include: orient to surroundings, keep bed in low position, maintain call bell within reach at all times, provide assistance with transfer out of bed and ambulation.  Medication Inspection Compliance: Pill count conducted under aseptic conditions, in front of the patient. Neither the pills nor the bottle was removed from the patient's sight at any time. Once count was completed pills were immediately returned to the patient in their original bottle.  Medication: Tramadol (Ultram) Pill/Patch Count:  42 of 90 pills remain Pill/Patch Appearance: Markings consistent with prescribed medication Bottle Appearance: Standard pharmacy container. Clearly labeled. Filled Date: 6/ 24/ 2022 Last Medication intake:  Today Safety precautions to be maintained throughout the outpatient stay will include: orient to surroundings, keep bed in low position, maintain call bell within reach at all times, provide assistance with transfer out of bed and ambulation.

## 2021-02-13 ENCOUNTER — Other Ambulatory Visit: Payer: Self-pay | Admitting: Internal Medicine

## 2021-02-15 LAB — TOXASSURE SELECT 13 (MW), URINE

## 2021-03-27 ENCOUNTER — Other Ambulatory Visit: Payer: Self-pay | Admitting: Internal Medicine

## 2021-04-10 ENCOUNTER — Other Ambulatory Visit: Payer: Self-pay | Admitting: Internal Medicine

## 2021-04-23 ENCOUNTER — Encounter: Payer: Self-pay | Admitting: Internal Medicine

## 2021-04-23 ENCOUNTER — Ambulatory Visit (INDEPENDENT_AMBULATORY_CARE_PROVIDER_SITE_OTHER): Payer: Medicare Other | Admitting: Internal Medicine

## 2021-04-23 ENCOUNTER — Other Ambulatory Visit: Payer: Self-pay

## 2021-04-23 VITALS — BP 150/74 | HR 57 | Temp 96.4°F | Ht 68.0 in | Wt 235.0 lb

## 2021-04-23 DIAGNOSIS — Z23 Encounter for immunization: Secondary | ICD-10-CM | POA: Diagnosis not present

## 2021-04-23 DIAGNOSIS — R739 Hyperglycemia, unspecified: Secondary | ICD-10-CM | POA: Diagnosis not present

## 2021-04-23 DIAGNOSIS — Z8546 Personal history of malignant neoplasm of prostate: Secondary | ICD-10-CM

## 2021-04-23 DIAGNOSIS — G4730 Sleep apnea, unspecified: Secondary | ICD-10-CM

## 2021-04-23 DIAGNOSIS — Z86010 Personal history of colonic polyps: Secondary | ICD-10-CM

## 2021-04-23 DIAGNOSIS — M47816 Spondylosis without myelopathy or radiculopathy, lumbar region: Secondary | ICD-10-CM | POA: Diagnosis not present

## 2021-04-23 DIAGNOSIS — K2270 Barrett's esophagus without dysplasia: Secondary | ICD-10-CM

## 2021-04-23 DIAGNOSIS — G479 Sleep disorder, unspecified: Secondary | ICD-10-CM

## 2021-04-23 DIAGNOSIS — I10 Essential (primary) hypertension: Secondary | ICD-10-CM

## 2021-04-23 DIAGNOSIS — E039 Hypothyroidism, unspecified: Secondary | ICD-10-CM | POA: Diagnosis not present

## 2021-04-23 DIAGNOSIS — E7800 Pure hypercholesterolemia, unspecified: Secondary | ICD-10-CM | POA: Diagnosis not present

## 2021-04-23 NOTE — Progress Notes (Signed)
Patient ID: CORTLIN MARANO, male   DOB: 05/12/1935, 86 y.o.   MRN: [id number]   Subjective:    Patient ID: KAYSIN BROCK, male    DOB: 1934/08/03, 86 y.o.   MRN: [id number]  This visit occurred during the SARS-CoV-2 public health emergency.  Safety protocols were in place, including screening questions prior to the visit, additional usage of staff PPE, and extensive cleaning of exam room while observing appropriate contact time as indicated for disinfecting solutions.   Patient here for a scheduled follow up.   Chief Complaint  Patient presents with   Follow-up    3 month follow up. Would like to discuss a medication to help with sleeping.    Marland Kitchen   HPI Here to follow up regarding his blood pressure and cholesterol.  He wife recently passed.  Discussed.  He has good support.  Is having problems sleeping. Has known sleep apnea.  Did not tolerate cpap.  Has desired not to retry.  Discussed his sleep issues.  Discussed avoiding sedating medications with untreated sleep apnea.  Discussed melatonin.  He prefers to hold on medication at this time.  Discussed f/u sleep study and see if any other treatment options he could tolerate.  Will notify me if he desires to pursue further testing and treatment.  No chest pain.  Breathing stable.  Eating.  No nausea or vomiting.  [place] moving.     Past Medical History:  Diagnosis Date   Acute postoperative pain 03/21/2018   Anemia    Arthritis    Barrett esophagus    Cancer ([place])    prostate,skin   Chicken pox    Diverticulitis    Dysrhythmia    GERD (gastroesophageal reflux disease)    Hyperlipidemia    Hypertension    Hypothyroidism    Melanoma ([place])    Malignant resection   Sleep apnea    Ulcer    Past Surgical History:  Procedure Laterality Date   CARDIAC CATHETERIZATION     Cataract Surgery Right 02/13/14   COLON SURGERY  2006-2008-2011   polyps removed   COLONOSCOPY W/ POLYPECTOMY     COLONOSCOPY WITH PROPOFOL N/A 02/15/2018   Procedure:  COLONOSCOPY WITH PROPOFOL;  Surgeon: Manya Silvas, MD;  Location: [facility] ENDOSCOPY;  Service: Endoscopy;  Laterality: N/A;   COLONOSCOPY WITH PROPOFOL N/A 03/24/2020   Procedure: COLONOSCOPY WITH PROPOFOL;  Surgeon: Lesly Rubenstein, MD;  Location: [facility] ENDOSCOPY;  Service: Endoscopy;  Laterality: N/A;   cystocopy  2003   CYSTOSCOPY/RETROGRADE/URETEROSCOPY/STONE EXTRACTION WITH BASKET Right 10/28/2020   Procedure: CYSTOSCOPY/RETROGRADE PYELOGRAM/STONE EXTRACTION;  Surgeon: Abbie Sons, MD;  Location: [facility] ORS;  Service: Urology;  Laterality: Right;   ESOPHAGOGASTRODUODENOSCOPY (EGD) WITH PROPOFOL N/A 08/04/2016   Procedure: ESOPHAGOGASTRODUODENOSCOPY (EGD) WITH PROPOFOL;  Surgeon: Manya Silvas, MD;  Location: [facility] ENDOSCOPY;  Service: Endoscopy;  Laterality: N/A;   ESOPHAGOGASTRODUODENOSCOPY (EGD) WITH PROPOFOL N/A 03/24/2020   Procedure: ESOPHAGOGASTRODUODENOSCOPY (EGD) WITH PROPOFOL;  Surgeon: Lesly Rubenstein, MD;  Location: [facility] ENDOSCOPY;  Service: Endoscopy;  Laterality: N/A;   HEMORRHOID SURGERY     PROSTATE SURGERY     sleep study     Family History  Problem Relation Age of Onset   Liver disease Mother    Heart disease Father    Kidney disease Father    Social History   Socioeconomic History   Marital status: Married    Spouse name: Not on file   Number of children: Not on file   Years  of education: Not on file   Highest education level: Not on file  Occupational History   Not on file  Tobacco Use   Smoking status: Never   Smokeless tobacco: Never  Vaping Use   Vaping Use: Never used  Substance and Sexual Activity   Alcohol use: No    Alcohol/week: 0.0 standard drinks   Drug use: No   Sexual activity: Never  Other Topics Concern   Not on file  Social History Narrative   Not on file   Social Determinants of Health   Financial Resource Strain: Low Risk    Difficulty of Paying Living Expenses: Not hard at all  Food Insecurity: No Food Insecurity    Worried About Charity fundraiser in the Last Year: Never true   [place] in the Last Year: Never true  Transportation Needs: No Transportation Needs   Lack of Transportation (Medical): No   Lack of Transportation (Non-Medical): No  Physical Activity: Not on file  Stress: No Stress Concern Present   Feeling of Stress : Not at all  Social Connections: Unknown   Frequency of Communication with Friends and Family: Not on file   Frequency of Social Gatherings with Friends and Family: Not on file   Attends Religious Services: Not on file   Active Member of Clubs or Organizations: Not on file   Attends Archivist Meetings: Not on file   Marital Status: Married     Review of Systems  Constitutional:  Negative for appetite change and unexpected weight change.  HENT:  Negative for congestion and sinus pressure.   Respiratory:  Negative for cough and chest tightness.        Breathing stable.   Cardiovascular:  Negative for chest pain, palpitations and leg swelling.  Gastrointestinal:  Negative for abdominal pain, diarrhea, nausea and vomiting.  Genitourinary:  Negative for difficulty urinating and dysuria.  Musculoskeletal:  Negative for joint swelling and myalgias.  Skin:  Negative for color change and rash.  Neurological:  Negative for dizziness, light-headedness and headaches.  Psychiatric/Behavioral:  Positive for sleep disturbance. Negative for agitation and dysphoric mood.       Objective:     BP (!) 150/74 (BP Location: Left Arm, Patient Position: Sitting, Cuff Size: Large)   Pulse (!) 57   Temp (!) 96.4 F (35.8 C) (Temporal)   Ht '5\' 8"'  (1.727 m)   Wt 235 lb (106.6 kg)   SpO2 97%   BMI 35.73 kg/m  Wt Readings from Last 3 Encounters:  04/23/21 235 lb (106.6 kg)  02/10/21 228 lb (103.4 kg)  01/13/21 237 lb (107.5 kg)    Physical Exam Constitutional:      General: He is not in acute distress.    Appearance: Normal appearance. He is well-developed.   HENT:     Head: Normocephalic and atraumatic.     Right Ear: External ear normal.     Left Ear: External ear normal.  Eyes:     General: No scleral icterus.       Right eye: No discharge.        Left eye: No discharge.  Cardiovascular:     Rate and Rhythm: Normal rate and regular rhythm.  Pulmonary:     Effort: Pulmonary effort is normal. No respiratory distress.     Breath sounds: Normal breath sounds.  Abdominal:     General: Bowel sounds are normal.     Palpations: Abdomen is soft.  Tenderness: There is no abdominal tenderness.  Musculoskeletal:        General: No swelling or tenderness.     Cervical back: Neck supple. No tenderness.  Lymphadenopathy:     Cervical: No cervical adenopathy.  Skin:    Findings: No erythema or rash.  Neurological:     Mental Status: He is alert.  Psychiatric:        Mood and Affect: Mood normal.        Behavior: Behavior normal.     Outpatient Encounter Medications as of 04/23/2021  Medication Sig   acetaminophen (TYLENOL) 500 MG tablet Take 500 mg by mouth every 6 (six) hours as needed.   allopurinol (ZYLOPRIM) 100 MG tablet TAKE 1 TABLET BY MOUTH DAILY   amLODipine (NORVASC) 10 MG tablet TAKE 1 TABLET BY MOUTH DAILY   aspirin 81 MG tablet Take 81 mg by mouth daily.   calcium carbonate (OSCAL) 1500 (600 Ca) MG TABS tablet TAKE 2 TABLETS BY MOUTH EVERY DAY WITH BREAKFAST   fluticasone (FLONASE) 50 MCG/ACT nasal spray TWO PUFFS IN EACH NOSTRIL ONCE A DAY (Patient taking differently: Place 2 sprays into both nostrils daily.)   hydrALAZINE (APRESOLINE) 25 MG tablet TAKE ONE TABLET BY MOUTH THREE TIMES A DAY   irbesartan (AVAPRO) 300 MG tablet TAKE 1 TABLET BY MOUTH DAILY   levothyroxine (SYNTHROID) 100 MCG tablet TAKE 1 TABLET BY MOUTH DAILY WITH BREAKFAST   omeprazole (PRILOSEC) 20 MG capsule TAKE ONE CAPSULE BY MOUTH TWICE A DAY   senna (SENOKOT) 8.6 MG TABS tablet Take 2 tablets by mouth daily.   traMADol (ULTRAM) 50 MG tablet Take 1  tablet (50 mg total) by mouth every 8 (eight) hours as needed for severe pain.   valACYclovir (VALTREX) 1000 MG tablet TAKE 1 TABLET BY MOUTH DAILY   [DISCONTINUED] rosuvastatin (CRESTOR) 5 MG tablet TAKE 1 TABLET BY MOUTH DAILY (Patient not taking: Reported on 04/23/2021)   No facility-administered encounter medications on file as of 04/23/2021.     Lab Results  Component Value Date   WBC 7.4 02/05/2021   HGB 14.9 02/05/2021   HCT 44.1 02/05/2021   PLT 222.0 02/05/2021   GLUCOSE 88 02/05/2021   CHOL 139 02/05/2021   TRIG 163.0 (H) 02/05/2021   HDL 52.20 02/05/2021   LDLDIRECT 101.0 08/03/2018   LDLCALC 54 02/05/2021   ALT 14 02/05/2021   AST 17 02/05/2021   NA 141 02/05/2021   K 3.9 02/05/2021   CL 105 02/05/2021   CREATININE 1.32 02/05/2021   BUN 14 02/05/2021   CO2 25 02/05/2021   TSH 3.04 02/05/2021   PSA 0.00 (L) 06/16/2020   HGBA1C 5.3 02/05/2021    DG OR UROLOGY CYSTO IMAGE ([facility] ONLY)  Result Date: 10/28/2020 There is no interpretation for this exam.  This order is for images obtained during a surgical procedure.  Please See "Surgeries" Tab for more information regarding the procedure.       Assessment & Plan:   Problem List Items Addressed This Visit     Barrett's esophagus    No upper symptoms.  On prilosec.  Stable. Has been followed by GI.       History of colonic polyps    Colonoscopy 03/2020 - multiple polyps.  Recommended f/u in one year.       History of prostate cancer    S/p prostatectomy.  Followed by Dr Bernardo Heater.        Hypercholesterolemia    Off cholesterol medication.  Low cholesterol diet and exercise.  Follow lipid panel.       Relevant Orders   Lipid panel   Hepatic function panel   Hyperglycemia    Low carb diet and exercise.  Follow met b and a1c.       Relevant Orders   Hemoglobin A1c   Hypertension    Blood pressure as outlined.  Continue hydralazine, avapro and amlodipine.  Follow pressures.  Follow metabolic panel.        Relevant Orders   Basic metabolic panel   Hypothyroid    On thyroid replacement.  Follow tsh.       Sleep apnea    Has known sleep apnea.  Unable to tolerate cpap.  Feel this is contributing to his sleep issues.  Discussed reevaluation and other treatment options.  Will notify me if agreeable.  Discussed avoiding sedating medication with untreated sleep apnea.       Sleep difficulties    Discussed.  Feel his untreated sleep apnea is contributing.  Increased stress with his wife's recent passing.  Discussed avoiding sedating medication with untreated sleep apnea.  Discussed melatonin.  He prefers no medication at this time.  Follow.       Osteoarthritis of lumbar spine (Chronic)    Followed by pain clinic.       Other Visit Diagnoses     Need for immunization against influenza    -  Primary   Relevant Orders   Flu Vaccine QUAD High Dose(Fluad) (Completed)        Einar Pheasant, MD

## 2021-04-24 ENCOUNTER — Other Ambulatory Visit: Payer: Self-pay | Admitting: Internal Medicine

## 2021-04-26 ENCOUNTER — Encounter: Payer: Self-pay | Admitting: Internal Medicine

## 2021-04-26 DIAGNOSIS — G479 Sleep disorder, unspecified: Secondary | ICD-10-CM | POA: Insufficient documentation

## 2021-04-26 NOTE — Assessment & Plan Note (Signed)
S/p prostatectomy.  Followed by Dr Stoioff.   

## 2021-04-26 NOTE — Assessment & Plan Note (Signed)
Followed by pain clinic.  

## 2021-04-26 NOTE — Assessment & Plan Note (Signed)
Colonoscopy 03/2020 - multiple polyps.  Recommended f/u in one year.

## 2021-04-26 NOTE — Assessment & Plan Note (Signed)
No upper symptoms.  On prilosec.  Stable. Has been followed by GI.  

## 2021-04-26 NOTE — Assessment & Plan Note (Signed)
Off cholesterol medication.  Low cholesterol diet and exercise.  Follow lipid panel.

## 2021-04-26 NOTE — Assessment & Plan Note (Signed)
Discussed.  Feel his untreated sleep apnea is contributing.  Increased stress with his wife's recent passing.  Discussed avoiding sedating medication with untreated sleep apnea.  Discussed melatonin.  He prefers no medication at this time.  Follow.

## 2021-04-26 NOTE — Assessment & Plan Note (Signed)
Low carb diet and exercise.  Follow met b and a1c.  

## 2021-04-26 NOTE — Assessment & Plan Note (Signed)
On thyroid replacement.  Follow tsh.  

## 2021-04-26 NOTE — Assessment & Plan Note (Signed)
Blood pressure as outlined.  Continue hydralazine, avapro and amlodipine.  Follow pressures.  Follow metabolic panel.

## 2021-04-26 NOTE — Assessment & Plan Note (Signed)
Has known sleep apnea.  Unable to tolerate cpap.  Feel this is contributing to his sleep issues.  Discussed reevaluation and other treatment options.  Will notify me if agreeable.  Discussed avoiding sedating medication with untreated sleep apnea.

## 2021-04-28 DIAGNOSIS — H2512 Age-related nuclear cataract, left eye: Secondary | ICD-10-CM | POA: Diagnosis not present

## 2021-04-28 DIAGNOSIS — Z961 Presence of intraocular lens: Secondary | ICD-10-CM | POA: Diagnosis not present

## 2021-05-08 ENCOUNTER — Other Ambulatory Visit: Payer: Self-pay | Admitting: Internal Medicine

## 2021-06-01 ENCOUNTER — Ambulatory Visit (INDEPENDENT_AMBULATORY_CARE_PROVIDER_SITE_OTHER): Payer: Medicare Other

## 2021-06-01 VITALS — Ht 68.0 in | Wt 235.0 lb

## 2021-06-01 DIAGNOSIS — Z0000 Encounter for general adult medical examination without abnormal findings: Secondary | ICD-10-CM

## 2021-06-01 NOTE — Progress Notes (Signed)
Subjective:   Hector [name] is a 86 y.o. male who presents for Medicare Annual/Subsequent preventive examination.  Review of Systems    No ROS.  Medicare Wellness Virtual Visit.  Visual/audio telehealth visit, UTA vital signs.   See social history for additional risk factors.   Cardiac Risk Factors include: advanced age (>65men, >68 women);male gender;hypertension     Objective:    Today's Vitals   06/01/21 0927  Weight: 235 lb (106.6 kg)  Height: 5\' 8"  (1.727 m)   Body mass index is 35.73 kg/m.  Advanced Directives 06/01/2021 10/28/2020 10/18/2020 05/29/2020 03/24/2020 07/03/2019 05/29/2019  Does Patient Have a Medical Advance Directive? Yes Yes Yes Yes Yes Yes Yes  Type of Paramedic of [place];Living will Healthcare Power of [place] of [place];Living will Living will Healthcare Power of [place];Living will  Does patient want to make changes to medical advance directive? No - Patient declined No - Patient declined Yes (ED - Information included in AVS) No - Patient declined - - No - Patient declined  Copy of [place] in Chart? Yes - validated most recent copy scanned in chart (See row information) No - copy requested - Yes - validated most recent copy scanned in chart (See row information) - - Yes - validated most recent copy scanned in chart (See row information)    Current Medications (verified) Outpatient Encounter Medications as of 06/01/2021  Medication Sig   acetaminophen (TYLENOL) 500 MG tablet Take 500 mg by mouth every 6 (six) hours as needed.   allopurinol (ZYLOPRIM) 100 MG tablet TAKE 1 TABLET BY MOUTH DAILY   amLODipine (NORVASC) 10 MG tablet TAKE 1 TABLET BY MOUTH DAILY   aspirin 81 MG tablet Take 81 mg by mouth daily.   calcium carbonate (OSCAL) 1500 (600 Ca) MG TABS tablet TAKE 2 TABLETS BY MOUTH EVERY DAY WITH BREAKFAST   fluticasone (FLONASE)  50 MCG/ACT nasal spray TWO PUFFS IN EACH NOSTRIL ONCE A DAY (Patient taking differently: Place 2 sprays into both nostrils daily.)   hydrALAZINE (APRESOLINE) 25 MG tablet TAKE ONE TABLET BY MOUTH THREE TIMES A DAY   irbesartan (AVAPRO) 300 MG tablet TAKE 1 TABLET BY MOUTH DAILY   levothyroxine (SYNTHROID) 100 MCG tablet TAKE 1 TABLET BY MOUTH DAILY WITH BREAKFAST   omeprazole (PRILOSEC) 20 MG capsule TAKE ONE CAPSULE BY MOUTH TWICE A DAY   senna (SENOKOT) 8.6 MG TABS tablet Take 2 tablets by mouth daily.   traMADol (ULTRAM) 50 MG tablet Take 1 tablet (50 mg total) by mouth every 8 (eight) hours as needed for severe pain.   valACYclovir (VALTREX) 1000 MG tablet TAKE 1 TABLET BY MOUTH DAILY   No facility-administered encounter medications on file as of 06/01/2021.    Allergies (verified) Tape   History: Past Medical History:  Diagnosis Date   Acute postoperative pain 03/21/2018   Anemia    Arthritis    Barrett esophagus    Cancer ([place])    prostate,skin   Chicken pox    Diverticulitis    Dysrhythmia    GERD (gastroesophageal reflux disease)    Hyperlipidemia    Hypertension    Hypothyroidism    Melanoma ([place])    Malignant resection   Sleep apnea    Ulcer    Past Surgical History:  Procedure Laterality Date   CARDIAC CATHETERIZATION     Cataract Surgery Right 02/13/14   COLON SURGERY  2006-2008-2011  polyps removed   COLONOSCOPY W/ POLYPECTOMY     COLONOSCOPY WITH PROPOFOL N/A 02/15/2018   Procedure: COLONOSCOPY WITH PROPOFOL;  Surgeon: Manya Silvas, MD;  Location: [facility] ENDOSCOPY;  Service: Endoscopy;  Laterality: N/A;   COLONOSCOPY WITH PROPOFOL N/A 03/24/2020   Procedure: COLONOSCOPY WITH PROPOFOL;  Surgeon: Lesly Rubenstein, MD;  Location: [facility] ENDOSCOPY;  Service: Endoscopy;  Laterality: N/A;   cystocopy  2003   CYSTOSCOPY/RETROGRADE/URETEROSCOPY/STONE EXTRACTION WITH BASKET Right 10/28/2020   Procedure: CYSTOSCOPY/RETROGRADE PYELOGRAM/STONE EXTRACTION;  Surgeon:  Abbie Sons, MD;  Location: [facility] ORS;  Service: Urology;  Laterality: Right;   ESOPHAGOGASTRODUODENOSCOPY (EGD) WITH PROPOFOL N/A 08/04/2016   Procedure: ESOPHAGOGASTRODUODENOSCOPY (EGD) WITH PROPOFOL;  Surgeon: Manya Silvas, MD;  Location: [facility] ENDOSCOPY;  Service: Endoscopy;  Laterality: N/A;   ESOPHAGOGASTRODUODENOSCOPY (EGD) WITH PROPOFOL N/A 03/24/2020   Procedure: ESOPHAGOGASTRODUODENOSCOPY (EGD) WITH PROPOFOL;  Surgeon: Lesly Rubenstein, MD;  Location: [facility] ENDOSCOPY;  Service: Endoscopy;  Laterality: N/A;   HEMORRHOID SURGERY     PROSTATE SURGERY     sleep study     Family History  Problem Relation Age of Onset   Liver disease Mother    Heart disease Father    Kidney disease Father    Breast cancer Daughter    Social History   Socioeconomic History   Marital status: Married    Spouse name: Not on file   Number of children: Not on file   Years of education: Not on file   Highest education level: Not on file  Occupational History   Not on file  Tobacco Use   Smoking status: Never   Smokeless tobacco: Never  Vaping Use   Vaping Use: Never used  Substance and Sexual Activity   Alcohol use: No    Alcohol/week: 0.0 standard drinks   Drug use: No   Sexual activity: Never  Other Topics Concern   Not on file  Social History Narrative   Not on file   Social Determinants of Health   Financial Resource Strain: Low Risk    Difficulty of Paying Living Expenses: Not hard at all  Food Insecurity: No Food Insecurity   Worried About Charity fundraiser in the Last Year: Never true   [place] in the Last Year: Never true  Transportation Needs: No Transportation Needs   Lack of Transportation (Medical): No   Lack of Transportation (Non-Medical): No  Physical Activity: Unknown   Days of Exercise per Week: 0 days   Minutes of Exercise per Session: Not on file  Stress: No Stress Concern Present   Feeling of Stress : Not at all  Social Connections: Moderately  Isolated   Frequency of Communication with Friends and Family: More than three times a week   Frequency of Social Gatherings with Friends and Family: More than three times a week   Attends Religious Services: More than 4 times per year   Active Member of [employer] or Organizations: No   Attends Archivist Meetings: Not on file   Marital Status: Widowed    Tobacco Counseling Counseling given: Not Answered   Clinical Intake:  Pre-visit preparation completed: Yes        Diabetes: No  How often do you need to have someone help you when you read instructions, pamphlets, or other written materials from your doctor or pharmacy?: 1 - Never    Interpreter Needed?: No      Activities of Daily Living In your present state of health,  do you have any difficulty performing the following activities: 06/01/2021  Hearing? N  Vision? N  Difficulty concentrating or making decisions? N  Walking or climbing stairs? N  Dressing or bathing? N  Doing errands, shopping? N  Preparing Food and eating ? N  Using the Toilet? N  In the past six months, have you accidently leaked urine? N  Do you have problems with loss of bowel control? N  Managing your Medications? N  Managing your Finances? N  Housekeeping or managing your Housekeeping? N  Some recent data might be hidden    Patient Care Team: Einar Pheasant, MD as PCP - General (Internal Medicine) Einar Pheasant, MD (Internal Medicine)  Indicate any recent Medical Services you may have received from other than [facility] providers in the past year (date may be approximate).     Assessment:   This is a routine wellness examination for [place].  Virtual Visit via Telephone Note  I connected with  Hector [name] on 06/01/21 at  9:45 AM EDT by telephone and verified that I am speaking with the correct person using two identifiers.  Location: Patient: home Provider: office Persons participating in the virtual visit: patient/Nurse  Health Advisor   I discussed the limitations, risks, security and privacy concerns of performing an evaluation and management service by telephone and the availability of in person appointments. The patient expressed understanding and agreed to proceed.  Interactive audio and video telecommunications were attempted between this nurse and patient, however failed, due to patient having technical difficulties OR patient did not have access to video capability.  We continued and completed visit with audio only.  Some vital signs may be absent or patient reported.   OBrien-Blaney, Tessie Ordaz L, LPN   Hearing/Vision screen Hearing Screening - Comments:: Patient is able to hear conversational tones without difficulty.  No issues reported.  Vision Screening - Comments:: Followed by Dr. Graciella Belton [place] Wears glasses.  Dietary issues and exercise activities discussed:   Regular diet Good fluid intake   Goals Addressed             This Visit's Progress    Healthy Lifestyle       Stay active and hydrated Healthy diet; portion control         Depression Screen PHQ 2/9 Scores 06/01/2021 04/23/2021 05/29/2020 04/14/2020 05/29/2019 05/25/2018 04/18/2018  PHQ - 2 Score 0 0 0 0 0 0 0  PHQ- 9 Score - - - - - - -    Fall Risk Fall Risk  06/01/2021 04/23/2021 02/10/2021 11/05/2020 08/07/2020  Falls in the past year? 0 0 0 0 0  Number falls in past yr: 0 - - 0 -  Injury with Fall? 0 - - 0 -  Comment - - - - -  Follow up Falls evaluation completed Falls evaluation completed - - -    FALL RISK PREVENTION PERTAINING TO THE HOME: Home free of loose throw rugs in walkways, pet beds, electrical cords, etc? Yes  Adequate lighting in your home to reduce risk of falls? Yes   ASSISTIVE DEVICES UTILIZED TO PREVENT FALLS: Use of a cane, walker or w/c? No   TIMED UP AND GO: Was the test performed? No. Audio visit.   Cognitive Function: Patient is alert and oriented x3. MMSE/6CIT deferred. Normal by  direct communication/observation.  MMSE - Mini Mental State Exam 05/24/2016 05/13/2015  Orientation to time 5 5  Orientation to Place 5 5  Registration 3 3  Attention/ Calculation  5 5  Recall 3 3  Language- name 2 objects 2 2  Language- repeat 1 1  Language- follow 3 step command 3 3  Language- read & follow direction 1 1  Write a sentence 1 1  Copy design 1 1  Total score 30 30     6CIT Screen 05/29/2020 05/29/2019 05/25/2018 05/24/2017  What Year? 0 points 0 points 0 points 0 points  What month? 0 points 0 points 0 points 0 points  What time? 0 points 0 points 0 points 0 points  Count back from 20 0 points 0 points 0 points 0 points  Months in reverse - 0 points 0 points 0 points  Repeat phrase - 0 points 0 points 0 points  Total Score - 0 0 0    Immunizations Immunization History  Administered Date(s) Administered   Fluad Quad(high Dose 65+) 04/25/2019, 04/14/2020, 04/23/2021   Influenza Split 05/03/2012, 05/01/2014   Influenza Whole 05/09/2017   Influenza, High Dose Seasonal PF 04/13/2016, 03/30/2018   Influenza,inj,Quad PF,6+ Mos 05/13/2015   PFIZER(Purple Top)SARS-COV-2 Vaccination 08/16/2019, 09/06/2019, 03/31/2020   Pneumococcal Conjugate-13 04/17/2014   Pneumococcal Polysaccharide-23 05/24/2016   Td 04/17/2014   Zoster, Live 05/02/2010   Covid vaccine- 2 vaccines, 1 booster completed.   Qualifies for Shingles Vaccine? Yes   Zostavax completed Yes   Shingrix Completed?: No.    Education has been provided regarding the importance of this vaccine. Patient has been advised to call insurance company to determine out of pocket expense if they have not yet received this vaccine. Advised may also receive vaccine at local pharmacy or Health Dept. Verbalized acceptance and understanding.  Screening Tests Health Maintenance  Topic Date Due   COVID-19 Vaccine (4 - Booster for Pfizer series) 06/17/2021 (Originally 05/26/2020)   Zoster Vaccines- Shingrix (1 of 2)  09/01/2021 (Originally 06/07/1954)   TETANUS/TDAP  04/17/2024   Pneumonia Vaccine 76+ Years old  Completed   INFLUENZA VACCINE  Completed   HPV VACCINES  Aged Out    Health Maintenance  There are no preventive care reminders to display for this patient.  Colorectal cancer screening: No longer required.   Lung Cancer Screening: (Low Dose CT Chest recommended if Age 34-80 years, 30 pack-year currently smoking OR have quit w/in 15years.) does not qualify.   Hepatitis C Screening: does not qualify  Vision Screening: Recommended annual ophthalmology exams for early detection of glaucoma and other disorders of the eye. Is the patient up to date with their annual eye exam?  Yes  Who is the provider or what is the name of the office in which the patient attends annual eye exams? Dr. Ellin Mayhew.  Dental Screening: Recommended annual dental exams for proper oral hygiene.  Community Resource Referral / Chronic Care Management: CRR required this visit?  No   CCM required this visit?  No      Plan:     I have personally reviewed and noted the following in the patient's chart:   Medical and social history Use of alcohol, tobacco or illicit drugs  Current medications and supplements including opioid prescriptions. Patient is currently taking opioid prescriptions. Information provided to patient regarding non-opioid alternatives. Patient advised to discuss non-opioid treatment plan with their provider. Followed by Pain Clinic every 3-6 months.  Functional ability and status Nutritional status Physical activity Advanced directives List of other physicians Hospitalizations, surgeries, and ER visits in previous 12 months Vitals Screenings to include cognitive, depression, and falls Referrals and appointments  In addition, I  have reviewed and discussed with patient certain preventive protocols, quality metrics, and best practice recommendations. A written personalized care plan for preventive  services as well as general preventive health recommendations were provided to patient.     Varney Biles, LPN   [id number]

## 2021-06-01 NOTE — Patient Instructions (Addendum)
[name] , Thank you for taking time to come for your Medicare Wellness Visit. I appreciate your ongoing commitment to your health goals. Please review the following plan we discussed and let me know if I can assist you in the future.   These are the goals we discussed:  Goals      Healthy Lifestyle     Stay active and hydrated Healthy diet; portion control          This is a list of the screening recommended for you and due dates:  Health Maintenance  Topic Date Due   COVID-19 Vaccine (4 - Booster for Pfizer series) 06/17/2021*   Zoster (Shingles) Vaccine (1 of 2) 09/01/2021*   Tetanus Vaccine  04/17/2024   Pneumonia Vaccine  Completed   Flu Shot  Completed   HPV Vaccine  Aged Out  *Topic was postponed. The date shown is not the original due date.    Advanced directives: on file  Conditions/risks identified: none new  Next appointment: Follow up in one year for your annual wellness visit.   Preventive Care 32 Years and Older, Male Preventive care refers to lifestyle choices and visits with your health care provider that can promote health and wellness. What does preventive care include? A yearly physical exam. This is also called an annual well check. Dental exams once or twice a year. Routine eye exams. Ask your health care provider how often you should have your eyes checked. Personal lifestyle choices, including: Daily care of your teeth and gums. Regular physical activity. Eating a healthy diet. Avoiding tobacco and drug use. Limiting alcohol use. Practicing safe sex. Taking low doses of aspirin every day. Taking vitamin and mineral supplements as recommended by your health care provider. What happens during an annual well check? The services and screenings done by your health care provider during your annual well check will depend on your age, overall health, lifestyle risk factors, and family history of disease. Counseling  Your health care provider may ask  you questions about your: Alcohol use. Tobacco use. Drug use. Emotional well-being. Home and relationship well-being. Sexual activity. Eating habits. History of falls. Memory and ability to understand (cognition). Work and work Statistician. Screening  You may have the following tests or measurements: Height, weight, and BMI. Blood pressure. Lipid and cholesterol levels. These may be checked every 5 years, or more frequently if you are over 84 years old. Skin check. Lung cancer screening. You may have this screening every year starting at age 84 if you have a 30-pack-year history of smoking and currently smoke or have quit within the past 15 years. Fecal occult blood test (FOBT) of the stool. You may have this test every year starting at age 84. Flexible sigmoidoscopy or colonoscopy. You may have a sigmoidoscopy every 5 years or a colonoscopy every 10 years starting at age 84. Prostate cancer screening. Recommendations will vary depending on your family history and other risks. Hepatitis C blood test. Hepatitis B blood test. Sexually transmitted disease (STD) testing. Diabetes screening. This is done by checking your blood sugar (glucose) after you have not eaten for a while (fasting). You may have this done every 1-3 years. Abdominal aortic aneurysm (AAA) screening. You may need this if you are a current or former smoker. Osteoporosis. You may be screened starting at age 84 if you are at high risk. Talk with your health care provider about your test results, treatment options, and if necessary, the need for more tests. Vaccines  Your health care provider may recommend certain vaccines, such as: Influenza vaccine. This is recommended every year. Tetanus, diphtheria, and acellular pertussis (Tdap, Td) vaccine. You may need a Td booster every 10 years. Zoster vaccine. You may need this after age 84. Pneumococcal 13-valent conjugate (PCV13) vaccine. One dose is recommended after age  84. Pneumococcal polysaccharide (PPSV23) vaccine. One dose is recommended after age 84. Talk to your health care provider about which screenings and vaccines you need and how often you need them. This information is not intended to replace advice given to you by your health care provider. Make sure you discuss any questions you have with your health care provider. Document Released: 08/15/2015 Document Revised: 04/07/2016 Document Reviewed: 05/20/2015 Elsevier Interactive Patient Education  2017 [place] Prevention in the Home Falls can cause injuries. They can happen to people of all ages. There are many things you can do to make your home safe and to help prevent falls. What can I do on the outside of my home? Regularly fix the edges of walkways and driveways and fix any cracks. Remove anything that might make you trip as you walk through a door, such as a raised step or threshold. Trim any bushes or trees on the path to your home. Use bright outdoor lighting. Clear any walking paths of anything that might make someone trip, such as rocks or tools. Regularly check to see if handrails are loose or broken. Make sure that both sides of any steps have handrails. Any raised decks and porches should have guardrails on the edges. Have any leaves, snow, or ice cleared regularly. Use sand or salt on walking paths during winter. Clean up any spills in your garage right away. This includes oil or grease spills. What can I do in the bathroom? Use night lights. Install grab bars by the toilet and in the tub and shower. Do not use towel bars as grab bars. Use non-skid mats or decals in the tub or shower. If you need to sit down in the shower, use a plastic, non-slip stool. Keep the floor dry. Clean up any water that spills on the floor as soon as it happens. Remove soap buildup in the tub or shower regularly. Attach bath mats securely with double-sided non-slip rug tape. Do not have throw  rugs and other things on the floor that can make you trip. What can I do in the bedroom? Use night lights. Make sure that you have a light by your bed that is easy to reach. Do not use any sheets or blankets that are too big for your bed. They should not hang down onto the floor. Have a firm chair that has side arms. You can use this for support while you get dressed. Do not have throw rugs and other things on the floor that can make you trip. What can I do in the kitchen? Clean up any spills right away. Avoid walking on wet floors. Keep items that you use a lot in easy-to-reach places. If you need to reach something above you, use a strong step stool that has a grab bar. Keep electrical cords out of the way. Do not use floor polish or wax that makes floors slippery. If you must use wax, use non-skid floor wax. Do not have throw rugs and other things on the floor that can make you trip. What can I do with my stairs? Do not leave any items on the stairs. Make sure that there are  handrails on both sides of the stairs and use them. Fix handrails that are broken or loose. Make sure that handrails are as long as the stairways. Check any carpeting to make sure that it is firmly attached to the stairs. Fix any carpet that is loose or worn. Avoid having throw rugs at the top or bottom of the stairs. If you do have throw rugs, attach them to the floor with carpet tape. Make sure that you have a light switch at the top of the stairs and the bottom of the stairs. If you do not have them, ask someone to add them for you. What else can I do to help prevent falls? Wear shoes that: Do not have high heels. Have rubber bottoms. Are comfortable and fit you well. Are closed at the toe. Do not wear sandals. If you use a stepladder: Make sure that it is fully opened. Do not climb a closed stepladder. Make sure that both sides of the stepladder are locked into place. Ask someone to hold it for you, if  possible. Clearly mark and make sure that you can see: Any grab bars or handrails. First and last steps. Where the edge of each step is. Use tools that help you move around (mobility aids) if they are needed. These include: Canes. Walkers. Scooters. Crutches. Turn on the lights when you go into a dark area. Replace any light bulbs as soon as they burn out. Set up your furniture so you have a clear path. Avoid moving your furniture around. If any of your floors are uneven, fix them. If there are any pets around you, be aware of where they are. Review your medicines with your doctor. Some medicines can make you feel dizzy. This can increase your chance of falling. Ask your doctor what other things that you can do to help prevent falls. This information is not intended to replace advice given to you by your health care provider. Make sure you discuss any questions you have with your health care provider. Document Released: 05/15/2009 Document Revised: 12/25/2015 Document Reviewed: 08/23/2014 Elsevier Interactive Patient Education  2017 [place].  Opioid Pain Medicine Management Opioids are powerful medicines that are used to treat moderate to severe pain. When used for short periods of time, they can help you to: Sleep better. Do better in physical or occupational therapy. Feel better in the first few days after an injury. Recover from surgery. Opioids should be taken with the supervision of a trained health care provider. They should be taken for the shortest period of time possible. This is because opioids can be addictive, and the longer you take opioids, the greater your risk of addiction. This addiction can also be called opioid use disorder. What are the risks? Using opioid pain medicines for longer than 3 days increases your risk of side effects. Side effects include: Constipation. Nausea and vomiting. Breathing difficulties (respiratory  depression). Drowsiness. Confusion. Opioid use disorder. Itching. Taking opioid pain medicine for a long period of time can affect your ability to do daily tasks. It also puts you at risk for: Motor vehicle crashes. Depression. Suicide. Heart attack. Overdose, which can be life-threatening. What is a pain treatment plan? A pain treatment plan is an agreement between you and your health care provider. Pain is unique to each person, and treatments vary depending on your condition. To manage your pain, you and your health care provider need to work together. To help you do this: Discuss the goals of your  treatment, including how much pain you might expect to have and how you will manage the pain. Review the risks and benefits of taking opioid medicines. Remember that a good treatment plan uses more than one approach and minimizes the chance of side effects. Be honest about the amount of medicines you take and about any drug or alcohol use. Get pain medicine prescriptions from only one health care provider. Pain can be managed with many types of alternative treatments. Ask your health care provider to refer you to one or more specialists who can help you manage pain through: Physical or occupational therapy. Counseling (cognitive behavioral therapy). Good nutrition. Biofeedback. Massage. Meditation. Non-opioid medicine. Following a gentle exercise program. How to use opioid pain medicine Taking medicine Take your pain medicine exactly as told by your health care provider. Take it only when you need it. If your pain gets less severe, you may take less than your prescribed dose if your health care provider approves. If you are not having pain, do nottake pain medicine unless your health care provider tells you to take it. If your pain is severe, do nottry to treat it yourself by taking more pills than instructed on your prescription. Contact your health care provider for help. Write down  the times when you take your pain medicine. It is easy to become confused while on pain medicine. Writing the time can help you avoid overdose. Take other over-the-counter or prescription medicines only as told by your health care provider. Keeping yourself and others safe  While you are taking opioid pain medicine: Do not drive, use machinery, or power tools. Do not sign legal documents. Do not drink alcohol. Do not take sleeping pills. Do not supervise children by yourself. Do not do activities that require climbing or being in high places. Do not go to a lake, river, ocean, spa, or swimming pool. Do not share your pain medicine with anyone. Keep pain medicine in a locked cabinet or in a secure area where pets and children cannot reach it. Stopping your use of opioids If you have been taking opioid medicine for more than a few weeks, you may need to slowly decrease (taper) how much you take until you stop completely. Tapering your use of opioids can decrease your risk of symptoms of withdrawal, such as: Pain and cramping in the abdomen. Nausea. Sweating. Sleepiness. Restlessness. Uncontrollable shaking (tremors). Cravings for the medicine. Do not attempt to taper your use of opioids on your own. Talk with your health care provider about how to do this. Your health care provider may prescribe a step-down schedule based on how much medicine you are taking and how long you have been taking it. Getting rid of leftover pills Do not save any leftover pills. Get rid of leftover pills safely by: Taking the medicine to a prescription take-back program. This is usually offered by the county or law enforcement. Bringing them to a pharmacy that has a drug disposal container. Flushing them down the toilet. Check the label or package insert of your medicine to see whether this is safe to do. Throwing them out in the trash. Check the label or package insert of your medicine to see whether this is  safe to do. If it is safe to throw it out, remove the medicine from the original container, put it into a sealable bag or container, and mix it with used coffee grounds, food scraps, dirt, or cat litter before putting it in the trash. Follow these instructions  at home: Activity Do exercises as told by your health care provider. Avoid activities that make your pain worse. Return to your normal activities as told by your health care provider. Ask your health care provider what activities are safe for you. General instructions You may need to take these actions to prevent or treat constipation: Drink enough fluid to keep your urine pale yellow. Take over-the-counter or prescription medicines. Eat foods that are high in fiber, such as beans, whole grains, and fresh fruits and vegetables. Limit foods that are high in fat and processed sugars, such as fried or sweet foods. Keep all follow-up visits. This is important. Where to find support If you have been taking opioids for a long time, you may benefit from receiving support for quitting from a local support group or counselor. Ask your health care provider for a referral to these resources in your area. Where to find more information Centers for Disease Control and Prevention (CDC): http://www.wolf.info/ [place] Food and Drug Administration (FDA): [place] Get help right away if: You may have taken too much of an opioid (overdosed). Common symptoms of an overdose: Your breathing is slower or more shallow than normal. You have a very slow heartbeat (pulse). You have slurred speech. You have nausea and vomiting. Your pupils become very small. You have other potential symptoms: You are very confused. You faint or feel like you will faint. You have cold, clammy skin. You have blue lips or fingernails. You have thoughts of harming yourself or harming others. These symptoms may represent a serious problem that is an emergency. Do not wait to see if the  symptoms will go away. Get medical help right away. Call your local emergency services (911 in the [place] Do not drive yourself to the hospital.  If you ever feel like you may hurt yourself or others, or have thoughts about taking your own life, get help right away. Go to your nearest emergency department or: Call your local emergency services (911 in the [place] Call the [facility] (442)529-2947 in the [place] Call a suicide crisis helpline, such as the [facility] at 709-676-7155. This is open 24 hours a day in the [place] Text the Crisis Text Line at 239 876 2161 (in the [place].). Summary Opioid medicines can help you manage moderate to severe pain for a short period of time. A pain treatment plan is an agreement between you and your health care provider. Discuss the goals of your treatment, including how much pain you might expect to have and how you will manage the pain. If you think that you or someone else may have taken too much of an opioid, get medical help right away. This information is not intended to replace advice given to you by your health care provider. Make sure you discuss any questions you have with your health care provider. Document Revised: 10/29/2020 Document Reviewed: 10/29/2020 Elsevier Patient Education  [place].

## 2021-06-02 ENCOUNTER — Encounter: Payer: Self-pay | Admitting: Internal Medicine

## 2021-06-03 ENCOUNTER — Telehealth: Payer: Self-pay

## 2021-06-03 ENCOUNTER — Telehealth (INDEPENDENT_AMBULATORY_CARE_PROVIDER_SITE_OTHER): Payer: Medicare Other | Admitting: Internal Medicine

## 2021-06-03 DIAGNOSIS — R0981 Nasal congestion: Secondary | ICD-10-CM | POA: Diagnosis not present

## 2021-06-03 DIAGNOSIS — I10 Essential (primary) hypertension: Secondary | ICD-10-CM

## 2021-06-03 MED ORDER — CEFDINIR 300 MG PO CAPS: 300.0000 mg | ORAL_CAPSULE | Freq: Two times a day (BID) | ORAL | 0 refills | Status: DC

## 2021-06-03 NOTE — Telephone Encounter (Signed)
Dad's Fall allergies started acting up last weekend (10/22). He took some Coricidin HBP but he said it affected his ability to urinate so he stopped taking it.  Now he's full of congestion and feeling weak. I'm wondering if he now has a sinus infection. (He never ran a fever or had a sore throat - just his usual sinus symptoms - just has gotten worse now).  Suggestions? Thanks,  Leola Brazil, daughter

## 2021-06-03 NOTE — Telephone Encounter (Signed)
Will see him at 11:30.

## 2021-06-03 NOTE — Progress Notes (Signed)
[name], male   DOB: March 09, 1935, 86 y.o.   MRN: [id number]   Virtual Visit via video Note  This visit type was conducted due to national recommendations for restrictions regarding the COVID-19 pandemic (e.g. social distancing).  This format is felt to be most appropriate for this patient at this time.  All issues noted in this document were discussed and addressed.  No physical exam was performed (except for noted visual exam findings with Video Visits).   I connected with Hector [name] by a video enabled telemedicine application and verified that I am speaking with the correct person using two identifiers. Location patient: home Location provider: work Persons participating in the virtual visit: patient, provider  The limitations, risks, security and privacy concerns of performing an evaluation and management service by video and the availability of in person appointments have been discussed.  It has also been discussed with the patient that there may be a patient responsible charge related to this service. The patient expressed understanding and agreed to proceed.   Reason for visit: work in appt  HPI: Work in for congestion. Symptoms started 05/23/21.  Reports head congestion and nasal congestion.  Felt bad.  No headache.  Increased drainage.  No sore throat.  No chest pain or chest tightness.  No sob.  Occasional cough.  No chest congestion.  No vomiting or diarrhea.  Taking coricidin.  Feels similar to previus infections.  Has not been checked for covid.     ROS: See pertinent positives and negatives per HPI.  Past Medical History:  Diagnosis Date   Acute postoperative pain 03/21/2018   Anemia    Arthritis    Barrett esophagus    Cancer ([place])    prostate,skin   Chicken pox    Diverticulitis    Dysrhythmia    GERD (gastroesophageal reflux disease)    Hyperlipidemia    Hypertension    Hypothyroidism    Melanoma ([place])    Malignant resection   Sleep apnea    Ulcer      Past Surgical History:  Procedure Laterality Date   CARDIAC CATHETERIZATION     Cataract Surgery Right 02/13/14   COLON SURGERY  2006-2008-2011   polyps removed   COLONOSCOPY W/ POLYPECTOMY     COLONOSCOPY WITH PROPOFOL N/A 02/15/2018   Procedure: COLONOSCOPY WITH PROPOFOL;  Surgeon: Manya Silvas, MD;  Location[facility] ENDOSCOPY;  Service: Endoscopy;  Laterality: N/A;   COLONOSCOPY WITH PROPOFOL N/A 03/24/2020   Procedure: COLONOSCOPY WITH PROPOFOL;  Surgeon: Lesly Rubenstein, MD;  Location: [facility] ENDOSCOPY;  Service: Endoscopy;  Laterality: N/A;   cystocopy  2003   CYSTOSCOPY/RETROGRADE/URETEROSCOPY/STONE EXTRACTION WITH BASKET Right 10/28/2020   Procedure: CYSTOSCOPY/RETROGRADE PYELOGRAM/STONE EXTRACTION;  Surgeon: Abbie Sons, MD;  Location: [facility] ORS;  Service: Urology;  Laterality: Right;   ESOPHAGOGASTRODUODENOSCOPY (EGD) WITH PROPOFOL N/A 08/04/2016   Procedure: ESOPHAGOGASTRODUODENOSCOPY (EGD) WITH PROPOFOL;  Surgeon: Manya Silvas, MD;  Location: [facility] ENDOSCOPY;  Service: Endoscopy;  Laterality: N/A;   ESOPHAGOGASTRODUODENOSCOPY (EGD) WITH PROPOFOL N/A 03/24/2020   Procedure: ESOPHAGOGASTRODUODENOSCOPY (EGD) WITH PROPOFOL;  Surgeon: Lesly Rubenstein, MD;  Location: [facility] ENDOSCOPY;  Service: Endoscopy;  Laterality: N/A;   HEMORRHOID SURGERY     PROSTATE SURGERY     sleep study      Family History  Problem Relation Age of Onset   Liver disease Mother    Heart disease Father    Kidney disease Father    Breast cancer Daughter  SOCIAL HX: reviewed.    Current Outpatient Medications:    acetaminophen (TYLENOL) 500 MG tablet, Take 500 mg by mouth every 6 (six) hours as needed., Disp: , Rfl:    allopurinol (ZYLOPRIM) 100 MG tablet, TAKE 1 TABLET BY MOUTH DAILY, Disp: 30 tablet, Rfl: 6   amLODipine (NORVASC) 10 MG tablet, TAKE 1 TABLET BY MOUTH DAILY, Disp: 30 tablet, Rfl: 3   aspirin 81 MG tablet, Take 81 mg by mouth daily., Disp: , Rfl:    calcium carbonate  (OSCAL) 1500 (600 Ca) MG TABS tablet, TAKE 2 TABLETS BY MOUTH EVERY DAY WITH BREAKFAST, Disp: 180 tablet, Rfl: 1   cefdinir (OMNICEF) 300 MG capsule, Take 1 capsule (300 mg total) by mouth 2 (two) times daily., Disp: 20 capsule, Rfl: 0   fluticasone (FLONASE) 50 MCG/ACT nasal spray, TWO PUFFS IN EACH NOSTRIL ONCE A DAY (Patient taking differently: Place 2 sprays into both nostrils daily.), Disp: 16 g, Rfl: 4   hydrALAZINE (APRESOLINE) 25 MG tablet, TAKE ONE TABLET BY MOUTH THREE TIMES A DAY, Disp: 90 tablet, Rfl: 2   irbesartan (AVAPRO) 300 MG tablet, TAKE 1 TABLET BY MOUTH DAILY, Disp: 30 tablet, Rfl: 5   levothyroxine (SYNTHROID) 100 MCG tablet, TAKE 1 TABLET BY MOUTH DAILY WITH BREAKFAST, Disp: 30 tablet, Rfl: 3   omeprazole (PRILOSEC) 20 MG capsule, TAKE ONE CAPSULE BY MOUTH TWICE A DAY, Disp: 60 capsule, Rfl: 3   senna (SENOKOT) 8.6 MG TABS tablet, Take 2 tablets by mouth daily., Disp: , Rfl:    traMADol (ULTRAM) 50 MG tablet, Take 1 tablet (50 mg total) by mouth every 8 (eight) hours as needed for severe pain., Disp: 270 tablet, Rfl: 0   valACYclovir (VALTREX) 1000 MG tablet, TAKE 1 TABLET BY MOUTH DAILY, Disp: 30 tablet, Rfl: 3  EXAM:  GENERAL: alert, oriented, appears well and in no acute distress  HEENT: atraumatic, conjunttiva clear, no obvious abnormalities on inspection of external nose and ears  NECK: normal movements of the head and neck  LUNGS: on inspection no signs of respiratory distress, breathing rate appears normal, no obvious gross SOB, gasping or wheezing  CV: no obvious cyanosis  PSYCH/NEURO: pleasant and cooperative, no obvious depression or anxiety, speech and thought processing grossly intact  ASSESSMENT AND PLAN:  Discussed the following assessment and plan:  Problem List Items Addressed This Visit     Hypertension    Continue hydralazine, avapro and amlodipine.  Follow pressures.  Follow metabolic panel.       Sinus congestion    Increased head/nasal  congestion as outlined.  Minimal cough.  Increased drainage.  Discussed covid testing - they have home covid test.  Symptoms present since 05/23/21. No chest congestion, chest pain or sob.  Saline nasal spray, steroid nasal spray as directed.  Mucinex/robitussin DM. omnicef as directed.  Follow.  Call with update.         Return if symptoms worsen or fail to improve.   I discussed the assessment and treatment plan with the patient. The patient was provided an opportunity to ask questions and all were answered. The patient agreed with the plan and demonstrated an understanding of the instructions.   The patient was advised to call back or seek an in-person evaluation if the symptoms worsen or if the condition fails to improve as anticipated.    Einar Pheasant, MD

## 2021-06-03 NOTE — Telephone Encounter (Signed)
Left message for patient to call office.  

## 2021-06-03 NOTE — Telephone Encounter (Signed)
Patient having sinus congestion last week says gets this every year, just now has yellow mucus and just nasal congest no other symptoms scheduled Virtual at 11:30 ha snot tested for COVID.

## 2021-06-07 ENCOUNTER — Encounter: Payer: Self-pay | Admitting: Internal Medicine

## 2021-06-07 DIAGNOSIS — R0981 Nasal congestion: Secondary | ICD-10-CM | POA: Insufficient documentation

## 2021-06-07 NOTE — Assessment & Plan Note (Signed)
Increased head/nasal congestion as outlined.  Minimal cough.  Increased drainage.  Discussed covid testing - they have home covid test.  Symptoms present since 05/23/21. No chest congestion, chest pain or sob.  Saline nasal spray, steroid nasal spray as directed.  Mucinex/robitussin DM. omnicef as directed.  Follow.  Call with update.

## 2021-06-07 NOTE — Assessment & Plan Note (Signed)
Continue hydralazine, avapro and amlodipine.  Follow pressures.  Follow metabolic panel.

## 2021-07-03 ENCOUNTER — Other Ambulatory Visit: Payer: Self-pay | Admitting: Internal Medicine

## 2021-07-17 ENCOUNTER — Other Ambulatory Visit (INDEPENDENT_AMBULATORY_CARE_PROVIDER_SITE_OTHER): Payer: Medicare Other

## 2021-07-17 ENCOUNTER — Other Ambulatory Visit: Payer: Self-pay

## 2021-07-17 DIAGNOSIS — E7800 Pure hypercholesterolemia, unspecified: Secondary | ICD-10-CM | POA: Diagnosis not present

## 2021-07-17 DIAGNOSIS — I10 Essential (primary) hypertension: Secondary | ICD-10-CM

## 2021-07-17 DIAGNOSIS — R739 Hyperglycemia, unspecified: Secondary | ICD-10-CM

## 2021-07-17 LAB — BASIC METABOLIC PANEL
BUN: 16 mg/dL (ref 6–23)
CO2: 26 mEq/L (ref 19–32)
Calcium: 9.7 mg/dL (ref 8.4–10.5)
Chloride: 104 mEq/L (ref 96–112)
Creatinine, Ser: 1.37 mg/dL (ref 0.40–1.50)
GFR: 47.5 mL/min — ABNORMAL LOW (ref >60.00)
Glucose, Bld: 88 mg/dL (ref 70–99)
Potassium: 4 mEq/L (ref 3.5–5.1)
Sodium: 140 mEq/L (ref 135–145)

## 2021-07-17 LAB — HEPATIC FUNCTION PANEL
ALT: 31 U/L (ref 0–53)
AST: 26 U/L (ref 0–37)
Albumin: 4 g/dL (ref 3.5–5.2)
Alkaline Phosphatase: 66 U/L (ref 39–117)
Bilirubin, Direct: 0.1 mg/dL (ref 0.0–0.3)
Total Bilirubin: 0.6 mg/dL (ref 0.2–1.2)
Total Protein: 7.2 g/dL (ref 6.0–8.3)

## 2021-07-17 LAB — LIPID PANEL
Cholesterol: 179 mg/dL (ref 0–200)
HDL: 53.6 mg/dL (ref >39.00)
NonHDL: 125.07
Total CHOL/HDL Ratio: 3
Triglycerides: 208 mg/dL — ABNORMAL HIGH (ref 0.0–149.0)
VLDL: 41.6 mg/dL — ABNORMAL HIGH (ref 0.0–40.0)

## 2021-07-17 LAB — LDL CHOLESTEROL, DIRECT: Direct LDL: 100 mg/dL

## 2021-07-17 LAB — HEMOGLOBIN A1C: Hgb A1c MFr Bld: 5.5 % (ref 4.6–6.5)

## 2021-07-23 ENCOUNTER — Ambulatory Visit (INDEPENDENT_AMBULATORY_CARE_PROVIDER_SITE_OTHER): Payer: Medicare Other | Admitting: Internal Medicine

## 2021-07-23 ENCOUNTER — Encounter: Payer: Self-pay | Admitting: Internal Medicine

## 2021-07-23 ENCOUNTER — Other Ambulatory Visit: Payer: Self-pay

## 2021-07-23 DIAGNOSIS — D72829 Elevated white blood cell count, unspecified: Secondary | ICD-10-CM | POA: Diagnosis not present

## 2021-07-23 DIAGNOSIS — M47816 Spondylosis without myelopathy or radiculopathy, lumbar region: Secondary | ICD-10-CM | POA: Diagnosis not present

## 2021-07-23 DIAGNOSIS — M109 Gout, unspecified: Secondary | ICD-10-CM

## 2021-07-23 DIAGNOSIS — Z86010 Personal history of colonic polyps: Secondary | ICD-10-CM | POA: Diagnosis not present

## 2021-07-23 DIAGNOSIS — E7800 Pure hypercholesterolemia, unspecified: Secondary | ICD-10-CM

## 2021-07-23 DIAGNOSIS — I10 Essential (primary) hypertension: Secondary | ICD-10-CM | POA: Diagnosis not present

## 2021-07-23 DIAGNOSIS — E039 Hypothyroidism, unspecified: Secondary | ICD-10-CM

## 2021-07-23 DIAGNOSIS — R739 Hyperglycemia, unspecified: Secondary | ICD-10-CM | POA: Diagnosis not present

## 2021-07-23 DIAGNOSIS — Z8546 Personal history of malignant neoplasm of prostate: Secondary | ICD-10-CM

## 2021-07-23 MED ORDER — HYDRALAZINE HCL 50 MG PO TABS: 50.0000 mg | ORAL_TABLET | Freq: Three times a day (TID) | ORAL | 3 refills | Status: DC

## 2021-07-23 NOTE — Patient Instructions (Signed)
I am changing the hydralazine from 25mg  three times per day to 50mg  three times per day.  I have sent in a new prescription.

## 2021-07-23 NOTE — Progress Notes (Signed)
[name], male   DOB: 1934/10/30, 86 y.o.   MRN: [id number]   Subjective:    [name], male    DOB: 1935-03-24, 86 y.o.   MRN: [id number]  This visit occurred during the SARS-CoV-2 public health emergency.  Safety protocols were in place, including screening questions prior to the visit, additional usage of staff PPE, and extensive cleaning of exam room while observing appropriate contact time as indicated for disinfecting solutions.   Patient here for a scheduled follow up.   HPI Here to follow up regarding his blood pressure.  Also fell three weeks ago.  Working - leaves.  No residual pain or problems.  No head injury.  No chest pain or sob reported.  No increased cough or congestion reported.  No increased abdominal pain reported.  Bowels stable.  Reviewed outside blood pressures - averaging 138-150s/70s.  Discussed labs.    Past Medical History:  Diagnosis Date   Acute postoperative pain 03/21/2018   Anemia    Arthritis    Barrett esophagus    Cancer ([place])    prostate,skin   Chicken pox    Diverticulitis    Dysrhythmia    GERD (gastroesophageal reflux disease)    Hyperlipidemia    Hypertension    Hypothyroidism    Melanoma ([place])    Malignant resection   Sleep apnea    Ulcer    Past Surgical History:  Procedure Laterality Date   CARDIAC CATHETERIZATION     Cataract Surgery Right 02/13/14   COLON SURGERY  2006-2008-2011   polyps removed   COLONOSCOPY W/ POLYPECTOMY     COLONOSCOPY WITH PROPOFOL N/A 02/15/2018   Procedure: COLONOSCOPY WITH PROPOFOL;  Surgeon: Manya Silvas, MD;  Location: [facility] ENDOSCOPY;  Service: Endoscopy;  Laterality: N/A;   COLONOSCOPY WITH PROPOFOL N/A 03/24/2020   Procedure: COLONOSCOPY WITH PROPOFOL;  Surgeon: Lesly Rubenstein, MD;  Location: [facility] ENDOSCOPY;  Service: Endoscopy;  Laterality: N/A;   cystocopy  2003   CYSTOSCOPY/RETROGRADE/URETEROSCOPY/STONE EXTRACTION WITH BASKET Right 10/28/2020   Procedure:  CYSTOSCOPY/RETROGRADE PYELOGRAM/STONE EXTRACTION;  Surgeon: Abbie Sons, MD;  Location: [facility] ORS;  Service: Urology;  Laterality: Right;   ESOPHAGOGASTRODUODENOSCOPY (EGD) WITH PROPOFOL N/A 08/04/2016   Procedure: ESOPHAGOGASTRODUODENOSCOPY (EGD) WITH PROPOFOL;  Surgeon: Manya Silvas, MD;  Location: [facility] ENDOSCOPY;  Service: Endoscopy;  Laterality: N/A;   ESOPHAGOGASTRODUODENOSCOPY (EGD) WITH PROPOFOL N/A 03/24/2020   Procedure: ESOPHAGOGASTRODUODENOSCOPY (EGD) WITH PROPOFOL;  Surgeon: Lesly Rubenstein, MD;  Location: [facility] ENDOSCOPY;  Service: Endoscopy;  Laterality: N/A;   HEMORRHOID SURGERY     PROSTATE SURGERY     sleep study     Family History  Problem Relation Age of Onset   Liver disease Mother    Heart disease Father    Kidney disease Father    Breast cancer Daughter    Social History   Socioeconomic History   Marital status: Widowed    Spouse name: Not on file   Number of children: Not on file   Years of education: Not on file   Highest education level: Not on file  Occupational History   Not on file  Tobacco Use   Smoking status: Never   Smokeless tobacco: Never  Vaping Use   Vaping Use: Never used  Substance and Sexual Activity   Alcohol use: No    Alcohol/week: 0.0 standard drinks   Drug use: No   Sexual activity: Never  Other Topics Concern   Not on file  Social History Narrative   Not on file   Social Determinants of Health   Financial Resource Strain: Low Risk    Difficulty of Paying Living Expenses: Not hard at all  Food Insecurity: No Food Insecurity   Worried About Charity fundraiser in the Last Year: Never true   Arboriculturist in the Last Year: Never true  Transportation Needs: No Transportation Needs   Lack of Transportation (Medical): No   Lack of Transportation (Non-Medical): No  Physical Activity: Unknown   Days of Exercise per Week: 0 days   Minutes of Exercise per Session: Not on file  Stress: No Stress Concern Present    Feeling of Stress : Not at all  Social Connections: Moderately Isolated   Frequency of Communication with Friends and Family: More than three times a week   Frequency of Social Gatherings with Friends and Family: More than three times a week   Attends Religious Services: More than 4 times per year   Active Member of [employer] or Organizations: No   Attends Archivist Meetings: Not on file   Marital Status: Widowed     Review of Systems  Constitutional:  Negative for appetite change and unexpected weight change.  HENT:  Negative for congestion and sinus pressure.   Respiratory:  Negative for cough and chest tightness.        Breathing stable.   Cardiovascular:  Negative for chest pain and palpitations.  Gastrointestinal:  Negative for abdominal pain, diarrhea, nausea and vomiting.  Genitourinary:  Negative for difficulty urinating and dysuria.  Musculoskeletal:  Negative for joint swelling and myalgias.  Skin:  Negative for color change and rash.  Neurological:  Negative for dizziness, light-headedness and headaches.  Psychiatric/Behavioral:  Negative for agitation and dysphoric mood.        Increased stress - trying to cope with his wife's passing.  Has good support.       Objective:     BP (!) 148/70    Pulse 92    Temp 97.7 F (36.5 C)    Resp 18    Ht 5' 7.99" (1.727 m)    Wt 243 lb 6.4 oz (110.4 kg)    SpO2 97%    BMI 37.02 kg/m  Wt Readings from Last 3 Encounters:  07/23/21 243 lb 6.4 oz (110.4 kg)  06/03/21 235 lb (106.6 kg)  06/01/21 235 lb (106.6 kg)    Physical Exam Constitutional:      General: He is not in acute distress.    Appearance: Normal appearance. He is well-developed.  HENT:     Head: Normocephalic and atraumatic.     Right Ear: External ear normal.     Left Ear: External ear normal.  Eyes:     General: No scleral icterus.       Right eye: No discharge.        Left eye: No discharge.  Cardiovascular:     Rate and Rhythm: Normal rate and  regular rhythm.  Pulmonary:     Effort: Pulmonary effort is normal. No respiratory distress.     Breath sounds: Normal breath sounds.  Abdominal:     General: Bowel sounds are normal.     Palpations: Abdomen is soft.     Tenderness: There is no abdominal tenderness.  Musculoskeletal:        General: No swelling or tenderness.     Cervical back: Neck supple. No tenderness.  Lymphadenopathy:     Cervical:  No cervical adenopathy.  Skin:    Findings: No erythema or rash.  Neurological:     Mental Status: He is alert.  Psychiatric:        Mood and Affect: Mood normal.        Behavior: Behavior normal.     Outpatient Encounter Medications as of 07/23/2021  Medication Sig   acetaminophen (TYLENOL) 500 MG tablet Take 500 mg by mouth every 6 (six) hours as needed.   allopurinol (ZYLOPRIM) 100 MG tablet TAKE 1 TABLET BY MOUTH DAILY   amLODipine (NORVASC) 10 MG tablet TAKE 1 TABLET BY MOUTH DAILY   aspirin 81 MG tablet Take 81 mg by mouth daily.   calcium carbonate (OSCAL) 1500 (600 Ca) MG TABS tablet TAKE 2 TABLETS BY MOUTH EVERY DAY WITH BREAKFAST   cefdinir (OMNICEF) 300 MG capsule Take 1 capsule (300 mg total) by mouth 2 (two) times daily.   fluticasone (FLONASE) 50 MCG/ACT nasal spray TWO PUFFS IN EACH NOSTRIL ONCE A DAY (Patient taking differently: Place 2 sprays into both nostrils daily.)   hydrALAZINE (APRESOLINE) 50 MG tablet Take 1 tablet (50 mg total) by mouth 3 (three) times daily.   irbesartan (AVAPRO) 300 MG tablet TAKE 1 TABLET BY MOUTH DAILY   rosuvastatin (CRESTOR) 5 MG tablet TAKE 1 TABLET BY MOUTH DAILY   senna (SENOKOT) 8.6 MG TABS tablet Take 2 tablets by mouth daily.   traMADol (ULTRAM) 50 MG tablet Take 1 tablet (50 mg total) by mouth every 8 (eight) hours as needed for severe pain.   valACYclovir (VALTREX) 1000 MG tablet TAKE 1 TABLET BY MOUTH DAILY   [DISCONTINUED] hydrALAZINE (APRESOLINE) 25 MG tablet TAKE ONE TABLET BY MOUTH THREE TIMES A DAY   [DISCONTINUED]  levothyroxine (SYNTHROID) 100 MCG tablet TAKE 1 TABLET BY MOUTH DAILY WITH BREAKFAST   [DISCONTINUED] omeprazole (PRILOSEC) 20 MG capsule TAKE ONE CAPSULE BY MOUTH TWICE A DAY   No facility-administered encounter medications on file as of 07/23/2021.     Lab Results  Component Value Date   WBC 7.4 02/05/2021   HGB 14.9 02/05/2021   HCT 44.1 02/05/2021   PLT 222.0 02/05/2021   GLUCOSE 88 07/17/2021   CHOL 179 07/17/2021   TRIG 208.0 (H) 07/17/2021   HDL 53.60 07/17/2021   LDLDIRECT 100.0 07/17/2021   LDLCALC 54 02/05/2021   ALT 31 07/17/2021   AST 26 07/17/2021   NA 140 07/17/2021   K 4.0 07/17/2021   CL 104 07/17/2021   CREATININE 1.37 07/17/2021   BUN 16 07/17/2021   CO2 26 07/17/2021   TSH 3.04 02/05/2021   PSA 0.00 (L) 06/16/2020   HGBA1C 5.5 07/17/2021    DG OR UROLOGY CYSTO IMAGE ([facility] ONLY)  Result Date: 10/28/2020 There is no interpretation for this exam.  This order is for images obtained during a surgical procedure.  Please See "Surgeries" Tab for more information regarding the procedure.       Assessment & Plan:   Problem List Items Addressed This Visit     History of colonic polyps    Colonoscopy 03/2020 - multiple polyps.  Recommended f/u in one year.  Need to confirm f/u arranged.        History of prostate cancer    S/p prostatectomy.  Followed by Dr Bernardo Heater.        Hypercholesterolemia    Low cholesterol diet and exercise. Crestor.  Follow lipid panel and liver function tests.       Relevant Medications  hydrALAZINE (APRESOLINE) 50 MG tablet   Hyperglycemia    Low carb diet and exercise.  Follow met b and a1c.       Hypertension    Taking hydralazine, avapro and amlodipine.  Increase hydralazine to 69m tid. Follow pressures.  Follow metabolic panel.       Relevant Medications   hydrALAZINE (APRESOLINE) 50 MG tablet   Hypothyroid    On thyroid replacement.  Follow tsh.       Leukocytosis    White blood cell count wnl 02/05/21.        Gout (Chronic)    Continue allopurinol.  Stable.       Osteoarthritis of lumbar spine (Chronic)    Followed by pain clinic.         CEinar Pheasant MD

## 2021-07-31 ENCOUNTER — Other Ambulatory Visit: Payer: Self-pay | Admitting: Internal Medicine

## 2021-08-02 ENCOUNTER — Encounter: Payer: Self-pay | Admitting: Internal Medicine

## 2021-08-02 ENCOUNTER — Telehealth: Payer: Self-pay | Admitting: Internal Medicine

## 2021-08-02 NOTE — Assessment & Plan Note (Signed)
Colonoscopy 03/2020 - multiple polyps.  Recommended f/u in one year.  Need to confirm f/u arranged.

## 2021-08-02 NOTE — Assessment & Plan Note (Signed)
Followed by pain clinic.  

## 2021-08-02 NOTE — Assessment & Plan Note (Signed)
On thyroid replacement.  Follow tsh.  

## 2021-08-02 NOTE — Assessment & Plan Note (Signed)
Low carb diet and exercise.  Follow met b and a1c.

## 2021-08-02 NOTE — Assessment & Plan Note (Signed)
S/p prostatectomy.  Followed by Dr Stoioff.   

## 2021-08-02 NOTE — Assessment & Plan Note (Signed)
Continue allopurinol.  Stable.

## 2021-08-02 NOTE — Assessment & Plan Note (Signed)
Taking hydralazine, avapro and amlodipine.  Increase hydralazine to 50mg  tid. Follow pressures.  Follow metabolic panel.

## 2021-08-02 NOTE — Assessment & Plan Note (Signed)
White blood cell count wnl 02/05/21.

## 2021-08-02 NOTE — Telephone Encounter (Signed)
Had colonoscopy 03/2020 which revealed multiple polyps.  Recommended f/u colonoscopy in one year.  Need to confirm f/u scheduled.  Saw Dr Haig Prophet [facility].

## 2021-08-02 NOTE — Assessment & Plan Note (Addendum)
Low cholesterol diet and exercise.  Crestor.  Follow lipid panel and liver function tests.   

## 2021-08-04 NOTE — Telephone Encounter (Signed)
LMTCB

## 2021-08-04 NOTE — Telephone Encounter (Signed)
Patient is not scheduled to see GI. Is unsure if he would like to f/u with them. He is going to think about it until his next appt in March and then will make a decision and let Dr Nicki Reaper know.

## 2021-08-04 NOTE — Telephone Encounter (Signed)
Pt returning call

## 2021-08-11 NOTE — Progress Notes (Signed)
PROVIDER NOTE: Information contained herein reflects review and annotations entered in association with encounter. Interpretation of such information and data should be left to medically-trained personnel. Information provided to patient can be located elsewhere in the medical record under "Patient Instructions". Document created using STT-dictation technology, any transcriptional errors that may result from process are unintentional.    Patient: Hector [name]  Service Category: E/M  Provider: Gaspar Cola, MD  DOB: 09/07/1934  DOS: 08/12/2021  Specialty: Interventional Pain Management  MRN: [id number]  Setting: Ambulatory outpatient  PCP: Einar Pheasant, MD  Type: Established Patient    Referring Provider: Einar Pheasant, MD  Location: Office  Delivery: Face-to-face     HPI  [name], a 87 y.o. year old male, is here today because of his Chronic bilateral low back pain with left-sided sciatica [M54.42, G89.29]. [name]'s primary complain today is Back Pain (lower) Last encounter: My last encounter with him was on Visit date not found. Pertinent problems: [name] has History of prostate cancer; Shoulder pain, left; Chronic hip pain (Left); Chronic low back pain (1ry area of Pain) (Bilateral) (L>R) w/ sciatica (Left); Chronic pain syndrome; Polyarthritis, unspecified (Secondary Area of Pain); L5-S1 bilateral pars defect with spondylolisthesis; Lumbosacral Grade 1 Anterolisthesis of L5 over S1; Lumbar facet arthropathy (Bilateral); Lumbar foraminal stenosis (L5-S1) (Bilateral) (L>R); Chronic Lumbar radiculitis (L5) (Left); Chronic musculoskeletal pain; Lumbar facet syndrome (Bilateral) (L>R); Chronic lower extremity pain (2ry area of Pain) (Left); Chronic lower extremity radicular pain (L5) (Left); Lumbar facet osteoarthritis (Bilateral); Osteoarthritis of lumbar spine; Lumbar spondylosis; Spondylosis without myelopathy or radiculopathy, lumbosacral region; Chronic right shoulder pain; DDD  (degenerative disc disease), lumbosacral; Gout; Chronic low back pain (Bilateral) w/o sciatica; and Neurogenic pain on their pertinent problem list. Pain Assessment: Severity of Chronic pain is reported as a 4 /10. Location: Back Right, Left, Lower/pain radiaties down both leg to his feet. Onset: More than a month ago. Quality: Aching, Sharp. Timing: Intermittent. Modifying factor(s): Meds and tylenol, laying down. Vitals:  height is '5\' 8"'  (1.727 m) and weight is 243 lb (110.2 kg). His temperature is 97.7 F (36.5 C). His blood pressure is 150/71 (abnormal) and his pulse is 59 (abnormal). His respiration is 16 and oxygen saturation is 97%.   Reason for encounter: medication management.   The patient indicates doing well with the current medication regimen. No adverse reactions or side effects reported to the medications.  Sadly, the patient recently lost his wife who was also a patient of mine.  He was a wonderful individual and we will miss her.  He comes in today indicating that he is having an increase in his low back pain and lower extremity pain.  He is having difficulty ambulating any particular distance.  He refers that his worst pain is in the lower back in the midline.  This pain then spreads bilaterally.  He does have a history of lumbar facet disease.  He is also complaining of bilateral lower extremity pain that seems to be going all the way down to the medial aspect of the lower leg into the top of the foot and what seems to be an L4 and an L5 dermatomal distribution.  He refers that it is bilateral but the left side seems to be worse than the right.  He would like to have an epidural steroid injection to get this pain under control.  I have reviewed his prior treatment and it would seem that he did get quite a bit  of relief with a caudal epidural steroid injection therefore we will be bringing him back for that.  The plan was shared with with the patient who understood and accepted.  The  patient's last set of x-rays of the lumbar spine were done on 06/29/2017 with an MRI having been done on 10/19/2016.  At that time he was having a bilateral pars defect at the L5-S1 level with a grade 1 anterolisthesis and moderate right foraminal encroachment and marked left foraminal encroachment with compression of the left L5 nerve root.  RTCB: 02/07/2022 Nonopioids transferred 05/07/2020: Calcium and vitamin D3  Pharmacotherapy Assessment  Analgesic: Tramadol 50 mg, 1 tab PO q 8 hrs (150 mg/day of tramadol) MME/day: 15 mg/day.   Monitoring: [place] PMP: PDMP reviewed during this encounter.       Pharmacotherapy: No side-effects or adverse reactions reported. Compliance: No problems identified. Effectiveness: Clinically acceptable.  Chauncey Fischer, RN  08/12/2021  1:53 PM  Sign when Signing Visit Nursing Pain Medication Assessment:  Safety precautions to be maintained throughout the outpatient stay will include: orient to surroundings, keep bed in low position, maintain call bell within reach at all times, provide assistance with transfer out of bed and ambulation.  Medication Inspection Compliance: Pill count conducted under aseptic conditions, in front of the patient. Neither the pills nor the bottle was removed from the patient's sight at any time. Once count was completed pills were immediately returned to the patient in their original bottle.  Medication: Tramadol (Ultram) Pill/Patch Count:  0 of 90 pills remain Pill/Patch Appearance: Markings consistent with prescribed medication Bottle Appearance: Standard pharmacy container. Clearly labeled. Filled Date: 7 / 22 / 2022 Last Medication intake:  TodaySafety precautions to be maintained throughout the outpatient stay will include: orient to surroundings, keep bed in low position, maintain call bell within reach at all times, provide assistance with transfer out of bed and ambulation.     UDS:  Summary  Date Value Ref Range Status   02/10/2021 Note  Final    Comment:    ==================================================================== [employer] Select 13 (MW) ==================================================================== Test                             Result       Flag       Units  Drug Present and Declared for Prescription Verification   Tramadol                       >2717        EXPECTED   ng/mg creat   O-Desmethyltramadol            1173         EXPECTED   ng/mg creat   N-Desmethyltramadol            >2717        EXPECTED   ng/mg creat    Source of tramadol is a prescription medication. O-desmethyltramadol    and N-desmethyltramadol are expected metabolites of tramadol.  ==================================================================== Test                      Result    Flag   Units      Ref Range   Creatinine              184              mg/dL      >=  20 ==================================================================== Declared Medications:  The flagging and interpretation on this report are based on the  following declared medications.  Unexpected results may arise from  inaccuracies in the declared medications.   **Note: The testing scope of this panel includes these medications:   Tramadol (Ultram)   **Note: The testing scope of this panel does not include the  following reported medications:   Acetaminophen (Tylenol)  Allopurinol (Zyloprim)  Amlodipine (Norvasc)  Aspirin  Calcium  Fluticasone (Flonase)  Hydralazine (Apresoline)  Hyoscyamine  Irbesartan (Avapro)  Levothyroxine (Synthroid)  Lysine  Omeprazole (Prilosec)  Oxybutynin (Ditropan)  Rosuvastatin (Crestor)  Sennosides (Senokot)  Valacyclovir (Valtrex) ==================================================================== For clinical consultation, please call [phone]. ====================================================================      ROS  Constitutional: Denies any fever or  chills Gastrointestinal: No reported hemesis, hematochezia, vomiting, or acute GI distress Musculoskeletal: Denies any acute onset joint swelling, redness, loss of ROM, or weakness Neurological: No reported episodes of acute onset apraxia, aphasia, dysarthria, agnosia, amnesia, paralysis, loss of coordination, or loss of consciousness  Medication Review  acetaminophen, allopurinol, amLODipine, aspirin, calcium carbonate, fluticasone, gabapentin, hydrALAZINE, irbesartan, levothyroxine, omeprazole, rosuvastatin, senna, traMADol, and valACYclovir  History Review  Allergy: Mr. Wedig is allergic to tape. Drug: Mr. Shammas  reports no history of drug use. Alcohol:  reports no history of alcohol use. Tobacco:  reports that he has never smoked. He has never used smokeless tobacco. Social: Mr. Lanum  reports that he has never smoked. He has never used smokeless tobacco. He reports that he does not drink alcohol and does not use drugs. Medical:  has a past medical history of Acute postoperative pain (03/21/2018), Anemia, Arthritis, Barrett esophagus, Cancer ([place]), Chicken pox, Diverticulitis, Dysrhythmia, GERD (gastroesophageal reflux disease), Hyperlipidemia, Hypertension, Hypothyroidism, Melanoma ([place]), Sleep apnea, and Ulcer. Surgical: Mr. Archuleta  has a past surgical history that includes Prostate surgery; cystocopy (2003); Hemorrhoid surgery; Cardiac catheterization; sleep study; Colon surgery (2006-2008-2011); Cataract Surgery (Right, 02/13/14); Esophagogastroduodenoscopy (egd) with propofol (N/A, 08/04/2016); Colonoscopy w/ polypectomy; Colonoscopy with propofol (N/A, 02/15/2018); Colonoscopy with propofol (N/A, 03/24/2020); Esophagogastroduodenoscopy (egd) with propofol (N/A, 03/24/2020); and Cystoscopy/retrograde/ureteroscopy/stone extraction with basket (Right, 10/28/2020). Family: family history includes Breast cancer in his daughter; Heart disease in his father; Kidney disease in his father; Liver disease in his  mother.  Laboratory Chemistry Profile   Renal Lab Results  Component Value Date   BUN 16 07/17/2021   CREATININE 1.37 07/17/2021   BCR 10 06/13/2017   GFR 47.50 (L) 07/17/2021   GFRAA >60 01/23/2018   GFRNONAA 38 (L) 10/18/2020    Hepatic Lab Results  Component Value Date   AST 26 07/17/2021   ALT 31 07/17/2021   ALBUMIN 4.0 07/17/2021   ALKPHOS 66 07/17/2021   LIPASE 27 10/18/2020    Electrolytes Lab Results  Component Value Date   NA 140 07/17/2021   K 4.0 07/17/2021   CL 104 07/17/2021   CALCIUM 9.7 07/17/2021   MG 2.0 06/13/2017    Bone Lab Results  Component Value Date   VD25OH 21.46 (L) 03/28/2018   25OHVITD1 20 (L) 06/13/2017   25OHVITD2 <1.0 06/13/2017   25OHVITD3 20 06/13/2017    Inflammation (CRP: Acute Phase) (ESR: Chronic Phase) Lab Results  Component Value Date   CRP 2.8 06/13/2017   ESRSEDRATE 13 06/13/2017         Note: Above Lab results reviewed.  Recent Imaging Review  DG OR UROLOGY CYSTO IMAGE ([place]) There is no interpretation for this exam.    This order is  for images obtained during a surgical procedure.  Please See  "Surgeries" Tab for more information regarding the procedure. Note: Reviewed        Physical Exam  General appearance: Well nourished, well developed, and well hydrated. In no apparent acute distress Mental status: Alert, oriented x 3 (person, place, & time)       Respiratory: No evidence of acute respiratory distress Eyes: PERLA Vitals: BP (!) 150/71    Pulse (!) 59    Temp 97.7 F (36.5 C)    Resp 16    Ht '5\' 8"'  (1.727 m)    Wt 243 lb (110.2 kg)    SpO2 97%    BMI 36.95 kg/m  BMI: Estimated body mass index is 36.95 kg/m as calculated from the following:   Height as of this encounter: '5\' 8"'  (1.727 m).   Weight as of this encounter: 243 lb (110.2 kg). Ideal: Ideal body weight: 68.4 kg (150 lb 12.7 oz) Adjusted ideal body weight: 85.1 kg (187 lb 10.8 oz)  Assessment   Status Diagnosis   Controlled Controlled Controlled 1. Chronic low back pain (1ry area of Pain) (Bilateral) (L>R) w/ sciatica (Left)   2. Lumbar facet syndrome (Bilateral) (L>R)   3. Lumbar facet arthropathy (Bilateral)   4. Chronic Lumbar radiculitis (L5) (Left)   5. Chronic lower extremity radicular pain (L5) (Left)   6. Chronic lower extremity pain (2ry area of Pain) (Left)   7. DDD (degenerative disc disease), lumbosacral   8. L5-S1 bilateral pars defect with spondylolisthesis   9. Lumbar foraminal stenosis (L5-S1) (Bilateral) (L>R)   10. Lumbosacral Grade 1 Anterolisthesis of L5 over S1   11. Neurogenic pain   12. Chronic pain syndrome   13. Pharmacologic therapy   14. Chronic use of opiate for therapeutic purpose   15. Uncomplicated opioid dependence ([place])   16. Encounter for medication management      Updated Problems: Problem  Neurogenic Pain  Chronic lower extremity pain (2ry area of Pain) (Left)    Plan of Care  Problem-specific:  No problem-specific Assessment & Plan notes found for this encounter.  Mr. SHULEM MADER has a current medication list which includes the following long-term medication(s): allopurinol, amlodipine, calcium carbonate, fluticasone, gabapentin, hydralazine, irbesartan, levothyroxine, omeprazole, rosuvastatin, and tramadol.  Pharmacotherapy (Medications Ordered): Meds ordered this encounter  Medications   traMADol (ULTRAM) 50 MG tablet    Sig: Take 1 tablet (50 mg total) by mouth in the morning, at noon, and at bedtime. Each refill must last 30 days.    Dispense:  90 tablet    Refill:  5    DO NOT: delete (not duplicate); no partial-fill (will deny script to complete), no refill request (F/U required). DISPENSE: 1 day early if closed on fill date. WARN: No CNS-depressants within 8 hrs of med.   gabapentin (NEURONTIN) 100 MG capsule    Sig: Take 1 capsule (100 mg total) by mouth at bedtime. Follow written titration schedule.    Dispense:  30 capsule    Refill:   0    Fill one day early if pharmacy is closed on scheduled refill date. May substitute for generic if available.   Orders:  Orders Placed This Encounter  Procedures   Caudal Epidural Injection    Standing Status:   Future    Standing Expiration Date:   11/10/2021    Scheduling Instructions:     Laterality: Midline     Level(s): Sacrococcygeal canal (Tailbone area)  Sedation: Patient's choice     Scheduling Timeframe: As soon as pre-approved    Order Specific Question:   Where will this procedure be performed?    Answer:   [facility]   DG Lumbar Spine Complete W/Bend    Patient presents with axial pain with possible radicular component. Please assist [place] in identifying specific level(s) and laterality of any additional findings such as: 1. Facet (Zygapophyseal) joint DJD (Hypertrophy, space narrowing, subchondral sclerosis, and/or osteophyte formation) 2. DDD and/or IVDD (Loss of disc height, desiccation, gas patterns, osteophytes, endplate sclerosis, or "Black disc disease") 3. Pars defects 4. Spondylolisthesis, spondylosis, and/or spondyloarthropathies (include Degree/Grade of displacement in mm) (stability) 5. Vertebral body Fractures (acute/chronic) (state percentage of collapse) 6. Demineralization (osteopenia/osteoporotic) 7. Bone pathology 8. Foraminal narrowing  9. Surgical changes    Standing Status:   Future    Standing Expiration Date:   09/12/2021    Scheduling Instructions:     Imaging must be done as soon as possible. Inform patient that order will expire within 30 days and I will not renew it.    Order Specific Question:   Reason for Exam (SYMPTOM  OR DIAGNOSIS REQUIRED)    Answer:   Low back pain    Order Specific Question:   Preferred imaging location?    Answer:   [facility]    Order Specific Question:   Call Results- Best Contact Number?    Answer:   [phone] ([facility])    Order Specific Question:   Radiology Contrast Protocol -  do NOT remove file path    Answer:   \[email or web address]    Order Specific Question:   Release to patient    Answer:   Immediate   MR LUMBAR SPINE WO CONTRAST    Patient presents with axial pain with possible radicular component. Please assist [place] in identifying specific level(s) and laterality of any additional findings such as: 1. Facet (Zygapophyseal) joint DJD (Hypertrophy, space narrowing, subchondral sclerosis, and/or osteophyte formation) 2. DDD and/or IVDD (Loss of disc height, desiccation, gas patterns, osteophytes, endplate sclerosis, or "Black disc disease") 3. Pars defects 4. Spondylolisthesis, spondylosis, and/or spondyloarthropathies (include Degree/Grade of displacement in mm) (stability) 5. Vertebral body Fractures (acute/chronic) (state percentage of collapse) 6. Demineralization (osteopenia/osteoporotic) 7. Bone pathology 8. Foraminal narrowing  9. Surgical changes 10. Central, Lateral Recess, and/or Foraminal Stenosis (include AP diameter of stenosis in mm) 11. Surgical changes (hardware type, status, and presence of fibrosis) 12. Modic Type Changes (MRI only) 13. IVDD (Disc bulge, protrusion, herniation, extrusion) (Level, laterality, extent)    Standing Status:   Future    Standing Expiration Date:   09/12/2021    Scheduling Instructions:     Imaging must be done as soon as possible. Inform patient that order will expire within 30 days and I will not renew it.    Order Specific Question:   What is the patient's sedation requirement?    Answer:   No Sedation    Order Specific Question:   Does the patient have a pacemaker or implanted devices?    Answer:   No    Order Specific Question:   Preferred imaging location?    Answer:   [facility] (table limit-350lbs)    Order Specific Question:   Call Results- Best Contact Number?    Answer:   [phone] ([facility])    Order Specific Question:   Radiology Contrast  Protocol - do NOT remove file path    Answer:   \  [email or web address]   Follow-up plan:   Return for [facility]) procedure: (ML) Caudal ESI #3, (Sed-anx).     Interventional Therapies  Risk   Complexity Considerations:   POOR Candidate for any more RFA due to movement and body habitus.   Planned   Pending:   Pending further evaluation   Under consideration:   Diagnostic left L5 TFESI  Possible left L5 nerve root ganglion RFA  Diagnostic left L4-5 LESI    Completed:   Diagnostic midline caudal ESI x2 (07/03/2019) Palliative right lumbar facet block x3 (08/07/2020) Palliative left lumbar facet block x2 (10/27/2017) Palliative right lumbar facet RFA x1 (04/18/2018)  Palliative left lumbar facet RFA x1 (03/21/2018)    Therapeutic   Palliative (PRN) options:   Diagnostic midline caudal ESI #3  Palliative right lumbar facet block #4  Palliative left lumbar facet block #3  Palliative right lumbar facet RFA #2  Palliative left lumbar facet RFA #2     Recent Visits No visits were found meeting these conditions. Showing recent visits within past 90 days and meeting all other requirements Today's Visits Date Type Provider Dept  08/12/21 Office Visit Milinda Pointer, MD [facility]  Showing today's visits and meeting all other requirements Future Appointments No visits were found meeting these conditions. Showing future appointments within next 90 days and meeting all other requirements  I discussed the assessment and treatment plan with the patient. The patient was provided an opportunity to ask questions and all were answered. The patient agreed with the plan and demonstrated an understanding of the instructions.  Patient advised to call back or seek an in-person evaluation if the symptoms or condition worsens.  Duration of encounter: 30 minutes.  Note by: Gaspar Cola, MD Date: 08/12/2021; Time: 2:43 PM

## 2021-08-12 ENCOUNTER — Ambulatory Visit: Payer: Medicare Other | Attending: Pain Medicine | Admitting: Pain Medicine

## 2021-08-12 ENCOUNTER — Other Ambulatory Visit: Payer: Self-pay

## 2021-08-12 ENCOUNTER — Encounter: Payer: Self-pay | Admitting: Pain Medicine

## 2021-08-12 VITALS — BP 150/71 | HR 59 | Temp 97.7°F | Resp 16 | Ht 68.0 in | Wt 243.0 lb

## 2021-08-12 DIAGNOSIS — M5410 Radiculopathy, site unspecified: Secondary | ICD-10-CM | POA: Diagnosis not present

## 2021-08-12 DIAGNOSIS — M79605 Pain in left leg: Secondary | ICD-10-CM | POA: Diagnosis not present

## 2021-08-12 DIAGNOSIS — M4310 Spondylolisthesis, site unspecified: Secondary | ICD-10-CM | POA: Diagnosis not present

## 2021-08-12 DIAGNOSIS — M5416 Radiculopathy, lumbar region: Secondary | ICD-10-CM | POA: Insufficient documentation

## 2021-08-12 DIAGNOSIS — G8929 Other chronic pain: Secondary | ICD-10-CM | POA: Diagnosis not present

## 2021-08-12 DIAGNOSIS — M5137 Other intervertebral disc degeneration, lumbosacral region: Secondary | ICD-10-CM | POA: Diagnosis not present

## 2021-08-12 DIAGNOSIS — G894 Chronic pain syndrome: Secondary | ICD-10-CM | POA: Insufficient documentation

## 2021-08-12 DIAGNOSIS — Z79899 Other long term (current) drug therapy: Secondary | ICD-10-CM | POA: Diagnosis not present

## 2021-08-12 DIAGNOSIS — M4300 Spondylolysis, site unspecified: Secondary | ICD-10-CM | POA: Insufficient documentation

## 2021-08-12 DIAGNOSIS — M792 Neuralgia and neuritis, unspecified: Secondary | ICD-10-CM | POA: Insufficient documentation

## 2021-08-12 DIAGNOSIS — M48061 Spinal stenosis, lumbar region without neurogenic claudication: Secondary | ICD-10-CM | POA: Diagnosis not present

## 2021-08-12 DIAGNOSIS — Z79891 Long term (current) use of opiate analgesic: Secondary | ICD-10-CM | POA: Insufficient documentation

## 2021-08-12 DIAGNOSIS — M47816 Spondylosis without myelopathy or radiculopathy, lumbar region: Secondary | ICD-10-CM | POA: Diagnosis not present

## 2021-08-12 DIAGNOSIS — M5442 Lumbago with sciatica, left side: Secondary | ICD-10-CM | POA: Diagnosis not present

## 2021-08-12 DIAGNOSIS — F1120 Opioid dependence, uncomplicated: Secondary | ICD-10-CM | POA: Insufficient documentation

## 2021-08-12 MED ORDER — TRAMADOL HCL 50 MG PO TABS: 50.0000 mg | ORAL_TABLET | Freq: Three times a day (TID) | ORAL | 5 refills | Status: DC

## 2021-08-12 MED ORDER — GABAPENTIN 100 MG PO CAPS
100.0000 mg | ORAL_CAPSULE | Freq: Every day | ORAL | 0 refills | Status: DC
Start: 2021-08-12 — End: 2021-10-23

## 2021-08-12 NOTE — Progress Notes (Signed)
Nursing Pain Medication Assessment:  Safety precautions to be maintained throughout the outpatient stay will include: orient to surroundings, keep bed in low position, maintain call bell within reach at all times, provide assistance with transfer out of bed and ambulation.  Medication Inspection Compliance: Pill count conducted under aseptic conditions, in front of the patient. Neither the pills nor the bottle was removed from the patient's sight at any time. Once count was completed pills were immediately returned to the patient in their original bottle.  Medication: Tramadol (Ultram) Pill/Patch Count:  0 of 90 pills remain Pill/Patch Appearance: Markings consistent with prescribed medication Bottle Appearance: Standard pharmacy container. Clearly labeled. Filled Date: 7 / 22 / 2022 Last Medication intake:  TodaySafety precautions to be maintained throughout the outpatient stay will include: orient to surroundings, keep bed in low position, maintain call bell within reach at all times, provide assistance with transfer out of bed and ambulation.

## 2021-08-14 ENCOUNTER — Other Ambulatory Visit: Payer: Self-pay | Admitting: Internal Medicine

## 2021-08-21 ENCOUNTER — Ambulatory Visit
Admission: RE | Admit: 2021-08-21 | Discharge: 2021-08-21 | Disposition: A | Discharge status: Home / Self Care | Payer: Medicare Other | Source: Ambulatory Visit | Attending: Pain Medicine | Admitting: Pain Medicine

## 2021-08-21 ENCOUNTER — Other Ambulatory Visit: Payer: Self-pay

## 2021-08-21 DIAGNOSIS — M5410 Radiculopathy, site unspecified: Secondary | ICD-10-CM | POA: Diagnosis not present

## 2021-08-21 DIAGNOSIS — M5416 Radiculopathy, lumbar region: Secondary | ICD-10-CM | POA: Insufficient documentation

## 2021-08-21 DIAGNOSIS — M5137 Other intervertebral disc degeneration, lumbosacral region: Secondary | ICD-10-CM | POA: Diagnosis not present

## 2021-08-21 DIAGNOSIS — G8929 Other chronic pain: Secondary | ICD-10-CM | POA: Diagnosis not present

## 2021-08-21 DIAGNOSIS — M47816 Spondylosis without myelopathy or radiculopathy, lumbar region: Secondary | ICD-10-CM | POA: Insufficient documentation

## 2021-08-21 DIAGNOSIS — M48061 Spinal stenosis, lumbar region without neurogenic claudication: Secondary | ICD-10-CM | POA: Diagnosis not present

## 2021-08-21 DIAGNOSIS — M4300 Spondylolysis, site unspecified: Secondary | ICD-10-CM | POA: Diagnosis not present

## 2021-08-21 DIAGNOSIS — M545 Low back pain, unspecified: Secondary | ICD-10-CM | POA: Diagnosis not present

## 2021-08-21 DIAGNOSIS — M5442 Lumbago with sciatica, left side: Secondary | ICD-10-CM | POA: Insufficient documentation

## 2021-08-21 DIAGNOSIS — M4310 Spondylolisthesis, site unspecified: Secondary | ICD-10-CM | POA: Diagnosis not present

## 2021-08-21 DIAGNOSIS — M79605 Pain in left leg: Secondary | ICD-10-CM | POA: Insufficient documentation

## 2021-08-27 ENCOUNTER — Ambulatory Visit
Admission: RE | Admit: 2021-08-27 | Discharge: 2021-08-27 | Disposition: A | Discharge status: Home / Self Care | Payer: Medicare Other | Source: Ambulatory Visit | Attending: Pain Medicine | Admitting: Pain Medicine

## 2021-08-27 ENCOUNTER — Encounter: Payer: Self-pay | Admitting: Pain Medicine

## 2021-08-27 ENCOUNTER — Ambulatory Visit (HOSPITAL_BASED_OUTPATIENT_CLINIC_OR_DEPARTMENT_OTHER): Payer: Medicare Other | Admitting: Pain Medicine

## 2021-08-27 ENCOUNTER — Other Ambulatory Visit: Payer: Self-pay

## 2021-08-27 VITALS — BP 134/75 | HR 61 | Temp 98.0°F | Resp 16 | Ht 68.0 in | Wt 243.0 lb

## 2021-08-27 DIAGNOSIS — M4310 Spondylolisthesis, site unspecified: Secondary | ICD-10-CM

## 2021-08-27 DIAGNOSIS — G894 Chronic pain syndrome: Secondary | ICD-10-CM | POA: Diagnosis not present

## 2021-08-27 DIAGNOSIS — M5137 Other intervertebral disc degeneration, lumbosacral region: Secondary | ICD-10-CM | POA: Diagnosis not present

## 2021-08-27 DIAGNOSIS — G8929 Other chronic pain: Secondary | ICD-10-CM

## 2021-08-27 DIAGNOSIS — M5410 Radiculopathy, site unspecified: Secondary | ICD-10-CM | POA: Diagnosis not present

## 2021-08-27 DIAGNOSIS — M5442 Lumbago with sciatica, left side: Secondary | ICD-10-CM

## 2021-08-27 DIAGNOSIS — M47816 Spondylosis without myelopathy or radiculopathy, lumbar region: Secondary | ICD-10-CM | POA: Diagnosis not present

## 2021-08-27 DIAGNOSIS — M79605 Pain in left leg: Secondary | ICD-10-CM | POA: Insufficient documentation

## 2021-08-27 DIAGNOSIS — M4300 Spondylolysis, site unspecified: Secondary | ICD-10-CM | POA: Insufficient documentation

## 2021-08-27 DIAGNOSIS — Z8546 Personal history of malignant neoplasm of prostate: Secondary | ICD-10-CM

## 2021-08-27 DIAGNOSIS — M48061 Spinal stenosis, lumbar region without neurogenic claudication: Secondary | ICD-10-CM

## 2021-08-27 DIAGNOSIS — M5416 Radiculopathy, lumbar region: Secondary | ICD-10-CM

## 2021-08-27 MED ORDER — MIDAZOLAM HCL 5 MG/5ML IJ SOLN
0.5000 mg | Freq: Once | INTRAMUSCULAR | Status: AC
  Administered 2021-08-27: 2 mg via INTRAVENOUS
  Filled 2021-08-27: qty 5

## 2021-08-27 MED ORDER — TRIAMCINOLONE ACETONIDE 40 MG/ML IJ SUSP
40.0000 mg | Freq: Once | INTRAMUSCULAR | Status: AC
  Administered 2021-08-27: 40 mg
  Filled 2021-08-27: qty 1

## 2021-08-27 MED ORDER — ROPIVACAINE HCL 2 MG/ML IJ SOLN
INTRAMUSCULAR | Status: AC
  Filled 2021-08-27: qty 20

## 2021-08-27 MED ORDER — SODIUM CHLORIDE (PF) 0.9 % IJ SOLN
INTRAMUSCULAR | Status: AC
  Filled 2021-08-27: qty 10

## 2021-08-27 MED ORDER — PENTAFLUOROPROP-TETRAFLUOROETH EX AERO
INHALATION_SPRAY | Freq: Once | CUTANEOUS | Status: DC
  Filled 2021-08-27: qty 116

## 2021-08-27 MED ORDER — LIDOCAINE HCL 2 % IJ SOLN
20.0000 mL | Freq: Once | INTRAMUSCULAR | Status: AC
  Administered 2021-08-27: 400 mg
  Filled 2021-08-27: qty 20

## 2021-08-27 MED ORDER — LACTATED RINGERS IV SOLN
1000.0000 mL | Freq: Once | INTRAVENOUS | Status: AC
  Administered 2021-08-27: 1000 mL via INTRAVENOUS

## 2021-08-27 MED ORDER — IOHEXOL 180 MG/ML  SOLN
10.0000 mL | Freq: Once | INTRAMUSCULAR | Status: AC
  Administered 2021-08-27: 5 mL via EPIDURAL

## 2021-08-27 MED ORDER — SODIUM CHLORIDE 0.9% FLUSH
2.0000 mL | Freq: Once | INTRAVENOUS | Status: AC
  Administered 2021-08-27: 2 mL

## 2021-08-27 MED ORDER — ROPIVACAINE HCL 2 MG/ML IJ SOLN
2.0000 mL | Freq: Once | INTRAMUSCULAR | Status: AC
  Administered 2021-08-27: 2 mL via EPIDURAL

## 2021-08-27 NOTE — Patient Instructions (Signed)
____________________________________________________________________________________________  Virtual Visits   What is a "Virtual Visit"? It is a healthcare communication encounter (medical visit) that takes place on real time (NOT TEXT or E-MAIL) over the telephone or computer device (desktop, laptop, tablet, smart phone, etc.). It allows for more location flexibility between the patient and the healthcare provider.  Who decides when these types of visits will be used? The physician.  Who is eligible for these types of visits? Only those patients that can be reliably reached over the telephone.  What do you mean by reliably? We do not have time to call everyone multiple times, therefore those that tend to screen calls and then call back later are not suitable candidates for this system. We understand how people are reluctant to pickup on "unknown" calls, therefore, we suggest adding our telephone numbers to your list of "CONTACT(s)". This way, you should be able to readily identify our calls when you receive one. All of our numbers are available below.   Who is not eligible? This option is not available for medication management encounters, specially for controlled substances. Patients on pain medications that fall under the category of controlled substances have to come in for "Face-to-Face" encounters. This is required for mandatory monitoring of these substances. You may be asked to provide a sample for an unannounced urine drug screening test (UDS), and we will need to count your pain pills. Not bringing your pills to be counted may result in no refill. Obviously, neither one of these can be done over the phone.  When will this type of visits be used? You can request a virtual visit whenever you are physically unable to attend a regular appointment. The decision will be made by the physician (or healthcare provider) on a case by case basis.   At what time will I be called? This is an  excellent question. The providers will try to call you whenever they have time available. Do not expect to be called at any specific time. The secretaries will assign you a time for your virtual visit appointment, but this is done simply to keep a list of those patients that need to be called, but not for the purpose of keeping a time schedule. Be advised that the call may come in anytime during the day, between the hours of 8:00 AM and 8::00 PM, depending on provider availability. We do understand that the system is not perfect. If you are unable to be available that day on a moments notice, then request an "in-person" appointment rather than a "virtual visit".  Can I request my medication visits to be "Virtual"? Yes you may request it, but the decision is entirely up to the healthcare provider. Control substances require specific monitoring that requires Face-to-Face encounters. The number of encounters  and the extent of the monitoring is determined on a case by case basis.  Add a new contact to your smart phone and label it "PAIN CLINIC" Under this contact add the following numbers: Main: [phone] (Official Contact Number) Nurses: [phone] (These are outgoing only calling systems. Do not call this number.) Dr. Brandalyn Harting: [phone] or [phone] (Outgoing calls only. Do not call this number.)  ____________________________________________________________________________________________   ____________________________________________________________________________________________  Post-Procedure Discharge Instructions  Instructions: Apply ice:  Purpose: This will minimize any swelling and discomfort after procedure.  When: Day of procedure, as soon as you get home. How: Fill a plastic sandwich bag with crushed ice. Cover it with a small towel and apply to   injection site. How long: (15 min on, 15 min off) Apply for 15 minutes then remove x 15 minutes.  Repeat sequence on day  of procedure, until you go to bed. Apply heat:  Purpose: To treat any soreness and discomfort from the procedure. When: Starting the next day after the procedure. How: Apply heat to procedure site starting the day following the procedure. How long: May continue to repeat daily, until discomfort goes away. Food intake: Start with clear liquids (like water) and advance to regular food, as tolerated.  Physical activities: Keep activities to a minimum for the first 8 hours after the procedure. After that, then as tolerated. Driving: If you have received any sedation, be responsible and do not drive. You are not allowed to drive for 24 hours after having sedation. Blood thinner: (Applies only to those taking blood thinners) You may restart your blood thinner 6 hours after your procedure. Insulin: (Applies only to Diabetic patients taking insulin) As soon as you can eat, you may resume your normal dosing schedule. Infection prevention: Keep procedure site clean and dry. Shower daily and clean area with soap and water. Post-procedure Pain Diary: Extremely important that this be done correctly and accurately. Recorded information will be used to determine the next step in treatment. For the purpose of accuracy, follow these rules: Evaluate only the area treated. Do not report or include pain from an untreated area. For the purpose of this evaluation, ignore all other areas of pain, except for the treated area. After your procedure, avoid taking a long nap and attempting to complete the pain diary after you wake up. Instead, set your alarm clock to go off every hour, on the hour, for the initial 8 hours after the procedure. Document the duration of the numbing medicine, and the relief you are getting from it. Do not go to sleep and attempt to complete it later. It will not be accurate. If you received sedation, it is likely that you were given a medication that may cause amnesia. Because of this, completing  the diary at a later time may cause the information to be inaccurate. This information is needed to plan your care. Follow-up appointment: Keep your post-procedure follow-up evaluation appointment after the procedure (usually 2 weeks for most procedures, 6 weeks for radiofrequencies). DO NOT FORGET to bring you pain diary with you.   Expect: (What should I expect to see with my procedure?) From numbing medicine (AKA: Local Anesthetics): Numbness or decrease in pain. You may also experience some weakness, which if present, could last for the duration of the local anesthetic. Onset: Full effect within 15 minutes of injected. Duration: It will depend on the type of local anesthetic used. On the average, 1 to 8 hours.  From steroids (Applies only if steroids were used): Decrease in swelling or inflammation. Once inflammation is improved, relief of the pain will follow. Onset of benefits: Depends on the amount of swelling present. The more swelling, the longer it will take for the benefits to be seen. In some cases, up to 10 days. Duration: Steroids will stay in the system x 2 weeks. Duration of benefits will depend on multiple posibilities including persistent irritating factors. Side-effects: If present, they may typically last 2 weeks (the duration of the steroids). Frequent: Cramps (if they occur, drink Gatorade and take over-the-counter Magnesium 450-500 mg once to twice a day); water retention with temporary weight gain; increases in blood sugar; decreased immune system response; increased appetite. Occasional: Facial flushing (red,   warm cheeks); mood swings; menstrual changes. Uncommon: Long-term decrease or suppression of natural hormones; bone thinning. (These are more common with higher doses or more frequent use. This is why we prefer that our patients avoid having any injection therapies in other practices.)  Very Rare: Severe mood changes; psychosis; aseptic necrosis. From procedure: Some  discomfort is to be expected once the numbing medicine wears off. This should be minimal if ice and heat are applied as instructed.  Call if: (When should I call?) You experience numbness and weakness that gets worse with time, as opposed to wearing off. New onset bowel or bladder incontinence. (Applies only to procedures done in the spine)  Emergency Numbers: Durning business hours (Monday - Thursday, 8:00 AM - 4:00 PM) (Friday, 9:00 AM - 12:00 Noon): [phone] After hours: [phone] NOTE: If you are having a problem and are unable connect with, or to talk to a provider, then go to your nearest urgent care or emergency department. If the problem is serious and urgent, please call 911. ____________________________________________________________________________________________   

## 2021-08-27 NOTE — Progress Notes (Signed)
Safety precautions to be maintained throughout the outpatient stay will include: orient to surroundings, keep bed in low position, maintain call bell within reach at all times, provide assistance with transfer out of bed and ambulation.  

## 2021-08-27 NOTE — Progress Notes (Signed)
PROVIDER NOTE: Interpretation of information contained herein should be left to medically-trained personnel. Specific patient instructions are provided elsewhere under "Patient Instructions" section of medical record. This document was created in part using STT-dictation technology, any transcriptional errors that may result from this process are unintentional.  Patient: Hector [name] Type: Established DOB: [birth year] MRN: [id number] PCP: Einar Pheasant, MD  Service: Procedure DOS: 08/27/2021 Setting: Ambulatory Location: Ambulatory outpatient facility Delivery: Face-to-face Provider: Gaspar Cola, MD Specialty: Interventional Pain Management Specialty designation: 09 Location: Outpatient facility Ref. Prov.: Milinda Pointer, MD    Primary Reason for Visit: Interventional Pain Management Treatment. CC: Back Pain (lower)    Procedure:          Anesthesia, Analgesia, Anxiolysis:  Type: Therapeutic Epidural Steroid Injection + Diagnostic Epidurogram #3  Region: Caudal Level: Sacrococcygeal   Laterality: Midline aiming at the left  Anesthesia: Local (1-2% Lidocaine)  Anxiolysis: None  Sedation: None  Guidance: Fluoroscopy           Position: Prone   1. Chronic low back pain (1ry area of Pain) (Bilateral) (L>R) w/ sciatica (Left)   2. Chronic lower extremity pain (2ry area of Pain) (Left)   3. Chronic lower extremity radicular pain (L5) (Left)   4. Chronic Lumbar radiculitis (L5) (Left)   5. DDD (degenerative disc disease), lumbosacral   6. L5-S1 bilateral pars defect with spondylolisthesis   7. Lumbosacral Grade 1 Anterolisthesis of L5 over S1   8. Lumbar spondylosis   9. History of prostate cancer    NAS-11 Pain score:   Pre-procedure: 5 /10   Post-procedure: 0-No pain/10     Pre-op H&P Assessment:  Hector [name] is a 87 y.o. (year old), male patient, seen today for interventional treatment. He  has a past surgical history that includes Prostate surgery; cystocopy  (2003); Hemorrhoid surgery; Cardiac catheterization; sleep study; Colon surgery (2006-2008-2011); Cataract Surgery (Right, 02/13/14); Esophagogastroduodenoscopy (egd) with propofol (N/A, 08/04/2016); Colonoscopy w/ polypectomy; Colonoscopy with propofol (N/A, 02/15/2018); Colonoscopy with propofol (N/A, 03/24/2020); Esophagogastroduodenoscopy (egd) with propofol (N/A, 03/24/2020); and Cystoscopy/retrograde/ureteroscopy/stone extraction with basket (Right, 10/28/2020). Hector [name] has a current medication list which includes the following prescription(s): acetaminophen, allopurinol, amlodipine, aspirin, calcium carbonate, fluticasone, gabapentin, hydralazine, irbesartan, levothyroxine, omeprazole, rosuvastatin, senna, tramadol, and valacyclovir, and the following Facility-Administered Medications: pentafluoroprop-tetrafluoroeth. His primarily concern today is the Back Pain (lower)  Initial Vital Signs:  Pulse/HCG Rate: 99ECG Heart Rate: 91 Temp: 98.2 F (36.8 C) Resp: (!) 23 BP: (!) 141/59 SpO2: 98 %  BMI: Estimated body mass index is 36.95 kg/m as calculated from the following:   Height as of this encounter: 5\' 8"  (1.727 m).   Weight as of this encounter: 243 lb (110.2 kg).  Risk Assessment: Allergies: Reviewed. He is allergic to tape.  Allergy Precautions: None required Coagulopathies: Reviewed. None identified.  Blood-thinner therapy: None at this time Active Infection(s): Reviewed. None identified. Hector [name] is afebrile  Site Confirmation: Hector [name] was asked to confirm the procedure and laterality before marking the site Procedure checklist: Completed Consent: Before the procedure and under the influence of no sedative(s), amnesic(s), or anxiolytics, the patient was informed of the treatment options, risks and possible complications. To fulfill our ethical and legal obligations, as recommended by the American Medical Association's Code of Ethics, I have informed the patient of my clinical impression;  the nature and purpose of the treatment or procedure; the risks, benefits, and possible complications of the intervention; the alternatives, including doing nothing; the risk(s) and benefit(s) of the  alternative treatment(s) or procedure(s); and the risk(s) and benefit(s) of doing nothing. The patient was provided information about the general risks and possible complications associated with the procedure. These may include, but are not limited to: failure to achieve desired goals, infection, bleeding, organ or nerve damage, allergic reactions, paralysis, and death. In addition, the patient was informed of those risks and complications associated to Spine-related procedures, such as failure to decrease pain; infection (i.e.: Meningitis, epidural or intraspinal abscess); bleeding (i.e.: epidural hematoma, subarachnoid hemorrhage, or any other type of intraspinal or peri-dural bleeding); organ or nerve damage (i.e.: Any type of peripheral nerve, nerve root, or spinal cord injury) with subsequent damage to sensory, motor, and/or autonomic systems, resulting in permanent pain, numbness, and/or weakness of one or several areas of the body; allergic reactions; (i.e.: anaphylactic reaction); and/or death. Furthermore, the patient was informed of those risks and complications associated with the medications. These include, but are not limited to: allergic reactions (i.e.: anaphylactic or anaphylactoid reaction(s)); adrenal axis suppression; blood sugar elevation that in diabetics may result in ketoacidosis or comma; water retention that in patients with history of congestive heart failure may result in shortness of breath, pulmonary edema, and decompensation with resultant heart failure; weight gain; swelling or edema; medication-induced neural toxicity; particulate matter embolism and blood vessel occlusion with resultant organ, and/or nervous system infarction; and/or aseptic necrosis of one or more joints. Finally,  the patient was informed that Medicine is not an exact science; therefore, there is also the possibility of unforeseen or unpredictable risks and/or possible complications that may result in a catastrophic outcome. The patient indicated having understood very clearly. We have given the patient no guarantees and we have made no promises. Enough time was given to the patient to ask questions, all of which were answered to the patient's satisfaction. Mr. Grizzle has indicated that he wanted to continue with the procedure. Attestation: I, the ordering provider, attest that I have discussed with the patient the benefits, risks, side-effects, alternatives, likelihood of achieving goals, and potential problems during recovery for the procedure that I have provided informed consent. Date   Time: 08/27/2021  8:20 AM  Pre-Procedure Preparation:  Monitoring: As per clinic protocol. Respiration, ETCO2, SpO2, BP, heart rate and rhythm monitor placed and checked for adequate function Safety Precautions: Patient was assessed for positional comfort and pressure points before starting the procedure. Time-out: I initiated and conducted the "Time-out" before starting the procedure, as per protocol. The patient was asked to participate by confirming the accuracy of the "Time Out" information. Verification of the correct person, site, and procedure were performed and confirmed by me, the nursing staff, and the patient. "Time-out" conducted as per Joint Commission's Universal Protocol (UP.01.01.01). Time: 0856  Description of Procedure:          Target Area: Caudal Epidural Canal. Approach: Midline approach. Area Prepped: Entire Posterior Sacrococcygeal Region DuraPrep (Iodine Povacrylex [0.7% available iodine] and Isopropyl Alcohol, 74% w/w) Safety Precautions: Aspiration looking for blood return was conducted prior to all injections. At no point did we inject any substances, as a needle was being advanced. No attempts were  made at seeking any paresthesias. Safe injection practices and needle disposal techniques used. Medications properly checked for expiration dates. SDV (single dose vial) medications used. Description of the Procedure: Protocol guidelines were followed. The patient was placed in position over the fluoroscopy table. The target area was identified and the area prepped in the usual manner. Skin & deeper tissues infiltrated with local  anesthetic. Appropriate amount of time allowed to pass for local anesthetics to take effect. The procedure needles were then advanced to the target area. Proper needle placement secured. Negative aspiration confirmed. Solution injected in intermittent fashion, asking for systemic symptoms every 0.5cc of injectate. The needles were then removed and the area cleansed, making sure to leave some of the prepping solution back to take advantage of its long term bactericidal properties. Vitals:   08/27/21 0857 08/27/21 0902 08/27/21 0905 08/27/21 0913  BP: (!) 155/64 (!) 143/86 (!) 153/63 134/75  Pulse:    61  Resp: 17 16 17 16   Temp:    98 F (36.7 C)  TempSrc:    Temporal  SpO2: 99% 99% 99% 97%  Weight:      Height:        Start Time: 0856 hrs. End Time: 0903 hrs. Materials:  Tray: Epidural Needle(s) Type: Epidural needle Gauge (G): 17 Length: Regular (3.5-in) Qty: 1 Imaging Guidance (Spinal):          Type of Imaging Technique: Fluoroscopy Guidance (Spinal) Indication(s): Assistance in needle guidance and placement for procedures requiring needle placement in or near specific anatomical locations not easily accessible without such assistance. Exposure Time: Please see nurses notes. Contrast: Before injecting any contrast, we confirmed that the patient did not have an allergy to iodine, shellfish, or radiological contrast. Once satisfactory needle placement was completed at the desired level, radiological contrast was injected. Contrast injected under live fluoroscopy.  No contrast complications. See chart for type and volume of contrast used. Fluoroscopic Guidance: I was personally present during the use of fluoroscopy. "Tunnel Vision Technique" used to obtain the best possible view of the target area. Parallax error corrected before commencing the procedure. "Direction-depth-direction" technique used to introduce the needle under continuous pulsed fluoroscopy. Once target was reached, antero-posterior, oblique, and lateral fluoroscopic projection used confirm needle placement in all planes. Images permanently stored in EMR. Interpretation: I personally interpreted the imaging intraoperatively. Adequate needle placement confirmed in multiple planes. Appropriate spread of contrast into desired area was observed. No evidence of afferent or efferent intravascular uptake. No intrathecal or subarachnoid spread observed. Permanent images saved into the patient's record.  Diagnostic Epidurogram:  Contrast: Before injecting any contrast, we confirmed that the patient did not have an allergy to iodine, shellfish, or radiological contrast. For accuracy purposes, contrast was injected under live fluoroscopy. Study personally interpreted intraoparatively. Type: Non-ionic, water soluble, hypoallergenic, myelogram-compatible, radiological contrast used. Please see orders and nurses note for specific choice of contrast. Volume: Please see nurses note for injected volume.  Observations:  Spinal Alignment: Adequate       Vertebral body: Intact Lamina: Intact Disc: Disc hight preserved Facet: Within Normal Limits.        Hardware: None  Spread: Appropriate epidural spread of contrast Anterior: Adequate Posterior: Adequate Superior (cephalad): Adequate Inferior (caudad): Adequate Right lateral: Adequate Left lateral: Adequate Plica medialis dorsalis: Medially aligned Nerve root(s): Adequate  Epidural extravasation: None observed Intrathecal: No intrathecal spread  identified Subarachnoid: No subarachnoid spread pattern observed Vascular: No evidence of afferent or efferent intravascular uptake  Impression: Technically successful epidurogram. See above for details Note: Hard copies saved to EMR.  Antibiotic Prophylaxis:   Anti-infectives (From admission, onward)    None      Indication(s): None identified  Post-operative Assessment:  Post-procedure Vital Signs:  Pulse/HCG Rate: 6177 Temp: 98 F (36.7 C) Resp: 16 BP: 134/75 SpO2: 97 %  EBL: None  Complications: No immediate post-treatment complications  observed by team, or reported by patient.  Note: The patient tolerated the entire procedure well. A repeat set of vitals were taken after the procedure and the patient was kept under observation following institutional policy, for this type of procedure. Post-procedural neurological assessment was performed, showing return to baseline, prior to discharge. The patient was provided with post-procedure discharge instructions, including a section on how to identify potential problems. Should any problems arise concerning this procedure, the patient was given instructions to immediately contact us, at any time, without hesitation. In any case, we plan to contact the patient by telephone for a follow-up status report regarding this interventional procedure.  Comments:  No additional relevant information.  Plan of Care  Orders:  Orders Placed This Encounter  Procedures   Caudal Epidural Injection    Scheduling Instructions:     Laterality: Midline     Level(s): Sacrococcygeal canal (Tailbone area)     Sedation: Patient's choice     Timeframe: Today    Order Specific Question:   Where will this procedure be performed?    Answer:   [facility]   DG [facility] C-ARM 1-60 MIN NO REPORT    Intraoperative interpretation by procedural physician at [facility].    Standing Status:   Standing    Number of Occurrences:   1     Order Specific Question:   Reason for exam:    Answer:   Assistance in needle guidance and placement for procedures requiring needle placement in or near specific anatomical locations not easily accessible without such assistance.   Informed Consent Details: Physician/Practitioner Attestation; Transcribe to consent form and obtain patient signature    Nursing Order: Transcribe to consent form and obtain patient signature. Note: Always confirm laterality of pain with Mr. Buckhalter, before procedure.    Order Specific Question:   Physician/Practitioner attestation of informed consent for procedure/surgical case    Answer:   I, the physician/practitioner, attest that I have discussed with the patient the benefits, risks, side effects, alternatives, likelihood of achieving goals and potential problems during recovery for the procedure that I have provided informed consent.    Order Specific Question:   Procedure    Answer:   Caudal epidural steroid injection    Order Specific Question:   Physician/Practitioner performing the procedure    Answer:   Trude Cansler A. Dossie Arbour, MD    Order Specific Question:   Indication/Reason    Answer:   Low back pain and lower extremity pain secondary to lumbosacral radiculitis   Provide equipment / supplies at bedside    "Epidural Tray" (Disposable   single use) Catheter: NOT required    Standing Status:   Standing    Number of Occurrences:   1    Order Specific Question:   Specify    Answer:   Epidural Tray   Chronic Opioid Analgesic:  Tramadol 50 mg, 1 tab PO q 8 hrs (150 mg/day of tramadol) MME/day: 15 mg/day.   Medications ordered for procedure: Meds ordered this encounter  Medications   iohexol (OMNIPAQUE) 180 MG/ML injection 10 mL    Must be Myelogram-compatible. If not available, you may substitute with a water-soluble, non-ionic, hypoallergenic, myelogram-compatible radiological contrast medium.   lidocaine (XYLOCAINE) 2 % (with pres) injection 400 mg    pentafluoroprop-tetrafluoroeth (GEBAUERS) aerosol   lactated ringers infusion 1,000 mL   midazolam (VERSED) 5 MG/5ML injection 0.5-2 mg    Make sure Flumazenil is available in the pyxis when using  this medication. If oversedation occurs, administer 0.2 mg IV over 15 sec. If after 45 sec no response, administer 0.2 mg again over 1 min; may repeat at 1 min intervals; not to exceed 4 doses (1 mg)   sodium chloride flush (NS) 0.9 % injection 2 mL   ropivacaine (PF) 2 mg/mL (0.2%) (NAROPIN) injection 2 mL   triamcinolone acetonide (KENALOG-40) injection 40 mg   Medications administered: We administered iohexol, lidocaine, lactated ringers, midazolam, sodium chloride flush, ropivacaine (PF) 2 mg/mL (0.2%), and triamcinolone acetonide.  See the medical record for exact dosing, route, and time of administration.  Follow-up plan:   Return in about 2 weeks (around 09/10/2021) for Proc-day (T,Th), (VV), (PPE).       Interventional Therapies  Risk   Complexity Considerations:   POOR Candidate for any more RFA due to movement and body habitus.   Planned   Pending:   Pending further evaluation   Under consideration:   Diagnostic left L5 TFESI  Possible left L5 nerve root ganglion RFA  Diagnostic left L4-5 LESI    Completed:   Diagnostic midline caudal ESI x2 (07/03/2019) Palliative right lumbar facet block x3 (08/07/2020) Palliative left lumbar facet block x2 (10/27/2017) Palliative right lumbar facet RFA x1 (04/18/2018)  Palliative left lumbar facet RFA x1 (03/21/2018)    Therapeutic   Palliative (PRN) options:   Diagnostic midline caudal ESI #3  Palliative right lumbar facet block #4  Palliative left lumbar facet block #3  Palliative right lumbar facet RFA #2  Palliative left lumbar facet RFA #2      Recent Visits Date Type Provider Dept  08/12/21 Office Visit Milinda Pointer, MD [facility]  Showing recent visits within past 90 days and meeting all other requirements Today's  Visits Date Type Provider Dept  08/27/21 Procedure visit Milinda Pointer, MD [facility]  Showing today's visits and meeting all other requirements Future Appointments Date Type Provider Dept  09/10/21 Appointment Milinda Pointer, MD [facility]  Showing future appointments within next 90 days and meeting all other requirements  Disposition: Discharge home  Discharge (Date   Time): 08/27/2021; 0925 hrs.   Primary Care Physician: Einar Pheasant, MD Location: [facility] Note by: Gaspar Cola, MD Date: 08/27/2021; Time: 11:14 AM  Disclaimer:  Medicine is not an Chief Strategy Officer. The only guarantee in medicine is that nothing is guaranteed. It is important to note that the decision to proceed with this intervention was based on the information collected from the patient. The Data and conclusions were drawn from the patient's questionnaire, the interview, and the physical examination. Because the information was provided in large part by the patient, it cannot be guaranteed that it has not been purposely or unconsciously manipulated. Every effort has been made to obtain as much relevant data as possible for this evaluation. It is important to note that the conclusions that lead to this procedure are derived in large part from the available data. Always take into account that the treatment will also be dependent on availability of resources and existing treatment guidelines, considered by other Pain Management Practitioners as being common knowledge and practice, at the time of the intervention. For Medico-Legal purposes, it is also important to point out that variation in procedural techniques and pharmacological choices are the acceptable norm. The indications, contraindications, technique, and results of the above procedure should only be interpreted and judged by a Board-Certified Interventional Pain Specialist with extensive familiarity and  expertise in  the same exact procedure and technique.

## 2021-08-28 ENCOUNTER — Telehealth: Payer: Self-pay

## 2021-08-28 ENCOUNTER — Other Ambulatory Visit: Payer: Self-pay | Admitting: Internal Medicine

## 2021-08-28 NOTE — Telephone Encounter (Signed)
Post procedure phone call. Patient states he is doing well.  

## 2021-09-09 ENCOUNTER — Encounter: Payer: Self-pay | Admitting: Pain Medicine

## 2021-09-09 NOTE — Progress Notes (Signed)
Patient: Hector [name]  Service Category: E/M  Provider: Gaspar Cola, MD  DOB: 1934/09/25  DOS: 09/10/2021  Location: Office  MRN: [id number]  Setting: Ambulatory outpatient  Referring Provider: Einar Pheasant, MD  Type: Established Patient  Specialty: Interventional Pain Management  PCP: Einar Pheasant, MD  Location: Remote location  Delivery: TeleHealth     Virtual Encounter - Pain Management PROVIDER NOTE: Information contained herein reflects review and annotations entered in association with encounter. Interpretation of such information and data should be left to medically-trained personnel. Information provided to patient can be located elsewhere in the medical record under "Patient Instructions". Document created using STT-dictation technology, any transcriptional errors that may result from process are unintentional.    Contact & Pharmacy Preferred: [phone] Home: [phone] (home) Mobile: [phone] (mobile) E-mail: [email or web address], [place] [address] Phone: [phone] Fax: [phone]  [place] [address] ([address]), [place] - [place]) [place] [address] Phone: [phone] Fax: [phone]   Pre-screening  Mr. Vanvalkenburgh offered "in-person" vs "virtual" encounter. He indicated preferring virtual for this encounter.   Reason COVID-19*   Social distancing based on CDC and AMA recommendations.   I contacted [name] on 09/10/2021 via telephone.      I clearly identified myself as Gaspar Cola, MD. I verified that I was speaking with the correct person using two identifiers (Name: LADARION MUNYON, and date of birth: August 04, 1934).  Consent I sought verbal advanced consent from [name] for virtual visit interactions. I informed Mr. Capriotti of possible security and privacy concerns, risks, and limitations  associated with providing "not-in-person" medical evaluation and management services. I also informed Mr. Vlcek of the availability of "in-person" appointments. Finally, I informed him that there would be a charge for the virtual visit and that he could be  personally, fully or partially, financially responsible for it. Mr. Kuhner expressed understanding and agreed to proceed.   Historic Elements   Mr. BRADLEE HEITMAN is a 87 y.o. year old, male patient evaluated today after our last contact on 08/27/2021. Mr. Fjeld  has a past medical history of Acute postoperative pain (03/21/2018), Anemia, Arthritis, Barrett esophagus, Cancer ([place]), Chicken pox, Diverticulitis, Dysrhythmia, GERD (gastroesophageal reflux disease), Hyperlipidemia, Hypertension, Hypothyroidism, Melanoma ([place]), Sleep apnea, and Ulcer. He also  has a past surgical history that includes Prostate surgery; cystocopy (2003); Hemorrhoid surgery; Cardiac catheterization; sleep study; Colon surgery (2006-2008-2011); Cataract Surgery (Right, 02/13/14); Esophagogastroduodenoscopy (egd) with propofol (N/A, 08/04/2016); Colonoscopy w/ polypectomy; Colonoscopy with propofol (N/A, 02/15/2018); Colonoscopy with propofol (N/A, 03/24/2020); Esophagogastroduodenoscopy (egd) with propofol (N/A, 03/24/2020); and Cystoscopy/retrograde/ureteroscopy/stone extraction with basket (Right, 10/28/2020). Mr. Ferrall has a current medication list which includes the following prescription(s): acetaminophen, allopurinol, amlodipine, aspirin, calcium carbonate, fluticasone, gabapentin, hydralazine, irbesartan, levothyroxine, omeprazole, rosuvastatin, senna, tramadol, and valacyclovir. He  reports that he has never smoked. He has never used smokeless tobacco. He reports that he does not drink alcohol and does not use drugs. Mr. Abreu is allergic to tape.   HPI  Today, he is being contacted for a post-procedure assessment.  The patient indicates that the epidural steroid injection provided him with  an ongoing 100% relief of the lower extremity pain and an ongoing 90% relief of the low back pain.  He also stated that while the local anesthetic was in place he was experiencing 100% relief of both.  At this  time, he is doing well and therefore there is no need to do anything else.  He was encouraged to give [place] a call if the pain returns.  Post-procedure evaluation     Procedure:          Anesthesia, Analgesia, Anxiolysis:  Type: Therapeutic Epidural Steroid Injection + Diagnostic Epidurogram #3  Region: Caudal Level: Sacrococcygeal   Laterality: Midline aiming at the left  Anesthesia: Local (1-2% Lidocaine)  Anxiolysis: None  Sedation: None  Guidance: Fluoroscopy           Position: Prone   1. Chronic low back pain (1ry area of Pain) (Bilateral) (L>R) w/ sciatica (Left)   2. Chronic lower extremity pain (2ry area of Pain) (Left)   3. Chronic lower extremity radicular pain (L5) (Left)   4. Chronic Lumbar radiculitis (L5) (Left)   5. DDD (degenerative disc disease), lumbosacral   6. L5-S1 bilateral pars defect with spondylolisthesis   7. Lumbosacral Grade 1 Anterolisthesis of L5 over S1   8. Lumbar spondylosis   9. History of prostate cancer    NAS-11 Pain score:   Pre-procedure: 5 /10   Post-procedure: 0-No pain/10      Effectiveness:  Initial hour after procedure: 100 %. Subsequent 4-6 hours post-procedure: 100 %. Analgesia past initial 6 hours: 90 %. Ongoing improvement:  Analgesic: The patient indicates currently enjoying an ongoing 90% relief of his low back pain.  In the case of the lower extremity, he refers currently not having any lower extremity pain at all meaning that the procedure addressed his radiculitis/radiculopathy and he is currently enjoying an ongoing 100% relief of that lower extremity pain. Function: Mr. Perleberg reports improvement in function ROM: Mr. Radin reports improvement in ROM  Pharmacotherapy Assessment   Opioid Analgesic: Tramadol 50 mg, 1 tab  PO q 8 hrs (150 mg/day of tramadol) MME/day: 15 mg/day.   Monitoring: [place] PMP: PDMP reviewed during this encounter.       Pharmacotherapy: No side-effects or adverse reactions reported. Compliance: No problems identified. Effectiveness: Clinically acceptable. Plan: Refer to "POC". UDS:  Summary  Date Value Ref Range Status  02/10/2021 Note  Final    Comment:    ==================================================================== [employer] Select 13 (MW) ==================================================================== Test                             Result       Flag       Units  Drug Present and Declared for Prescription Verification   Tramadol                       >2717        EXPECTED   ng/mg creat   O-Desmethyltramadol            1173         EXPECTED   ng/mg creat   N-Desmethyltramadol            >2717        EXPECTED   ng/mg creat    Source of tramadol is a prescription medication. O-desmethyltramadol    and N-desmethyltramadol are expected metabolites of tramadol.  ==================================================================== Test                      Result    Flag   Units      Ref Range   Creatinine  184              mg/dL      >=20 ==================================================================== Declared Medications:  The flagging and interpretation on this report are based on the  following declared medications.  Unexpected results may arise from  inaccuracies in the declared medications.   **Note: The testing scope of this panel includes these medications:   Tramadol (Ultram)   **Note: The testing scope of this panel does not include the  following reported medications:   Acetaminophen (Tylenol)  Allopurinol (Zyloprim)  Amlodipine (Norvasc)  Aspirin  Calcium  Fluticasone (Flonase)  Hydralazine (Apresoline)  Hyoscyamine  Irbesartan (Avapro)  Levothyroxine (Synthroid)  Lysine  Omeprazole (Prilosec)  Oxybutynin (Ditropan)   Rosuvastatin (Crestor)  Sennosides (Senokot)  Valacyclovir (Valtrex) ==================================================================== For clinical consultation, please call [phone]. ====================================================================      Laboratory Chemistry Profile   Renal Lab Results  Component Value Date   BUN 16 07/17/2021   CREATININE 1.37 07/17/2021   BCR 10 06/13/2017   GFR 47.50 (L) 07/17/2021   GFRAA >60 01/23/2018   GFRNONAA 38 (L) 10/18/2020    Hepatic Lab Results  Component Value Date   AST 26 07/17/2021   ALT 31 07/17/2021   ALBUMIN 4.0 07/17/2021   ALKPHOS 66 07/17/2021   LIPASE 27 10/18/2020    Electrolytes Lab Results  Component Value Date   NA 140 07/17/2021   K 4.0 07/17/2021   CL 104 07/17/2021   CALCIUM 9.7 07/17/2021   MG 2.0 06/13/2017    Bone Lab Results  Component Value Date   VD25OH 21.46 (L) 03/28/2018   25OHVITD1 20 (L) 06/13/2017   25OHVITD2 <1.0 06/13/2017   25OHVITD3 20 06/13/2017    Inflammation (CRP: Acute Phase) (ESR: Chronic Phase) Lab Results  Component Value Date   CRP 2.8 06/13/2017   ESRSEDRATE 13 06/13/2017         Note: Above Lab results reviewed.  Imaging  DG PAIN CLINIC C-ARM 1-60 MIN NO REPORT Fluoro was used, but no Radiologist interpretation will be provided.  Please refer to "NOTES" tab for provider progress note.  Assessment  The primary encounter diagnosis was Chronic lower extremity pain (2ry area of Pain) (Left). Diagnoses of Chronic low back pain (1ry area of Pain) (Bilateral) (L>R) w/ sciatica (Left), Chronic Lumbar radiculitis (L5) (Left), and Lumbosacral Grade 1 Anterolisthesis of L5 over S1 were also pertinent to this visit.  Plan of Care  Problem-specific:  No problem-specific Assessment & Plan notes found for this encounter.  Mr. JOSHUAN BOLANDER has a current medication list which includes the following long-term medication(s): allopurinol, amlodipine, calcium  carbonate, fluticasone, gabapentin, hydralazine, irbesartan, levothyroxine, omeprazole, rosuvastatin, and tramadol.  Pharmacotherapy (Medications Ordered): No orders of the defined types were placed in this encounter.  Orders:  No orders of the defined types were placed in this encounter.  Follow-up plan:   Return in about 5 months (around 02/08/2022) for Eval-day (M,W), (F2F), (MM).     Interventional Therapies  Risk   Complexity Considerations:   POOR Candidate for any more RFA due to movement and body habitus.   Planned   Pending:   Pending further evaluation   Under consideration:   Diagnostic left L5 TFESI  Possible left L5 nerve root ganglion RFA  Diagnostic left L4-5 LESI    Completed:   Diagnostic midline caudal ESI x2 (07/03/2019) Palliative right lumbar facet block x3 (08/07/2020) Palliative left lumbar facet block x2 (10/27/2017) Palliative right lumbar facet RFA x1 (04/18/2018)  Palliative left lumbar facet RFA x1 (03/21/2018)    Therapeutic   Palliative (PRN) options:   Diagnostic midline caudal ESI #3  Palliative right lumbar facet block #4  Palliative left lumbar facet block #3  Palliative right lumbar facet RFA #2  Palliative left lumbar facet RFA #2     Recent Visits Date Type Provider Dept  08/27/21 Procedure visit Milinda Pointer, MD [facility]  08/12/21 Office Visit Milinda Pointer, MD [facility]  Showing recent visits within past 90 days and meeting all other requirements Today's Visits Date Type Provider Dept  09/10/21 Office Visit Milinda Pointer, MD [facility]  Showing today's visits and meeting all other requirements Future Appointments No visits were found meeting these conditions. Showing future appointments within next 90 days and meeting all other requirements  I discussed the assessment and treatment plan with the patient. The patient was provided an opportunity to ask questions and all were answered.  The patient agreed with the plan and demonstrated an understanding of the instructions.  Patient advised to call back or seek an in-person evaluation if the symptoms or condition worsens.  Duration of encounter: 14 minutes.  Note by: Gaspar Cola, MD Date: 09/10/2021; Time: 2:16 PM

## 2021-09-10 ENCOUNTER — Ambulatory Visit: Payer: Medicare Other | Attending: Pain Medicine | Admitting: Pain Medicine

## 2021-09-10 ENCOUNTER — Other Ambulatory Visit: Payer: Self-pay

## 2021-09-10 DIAGNOSIS — M79605 Pain in left leg: Secondary | ICD-10-CM

## 2021-09-10 DIAGNOSIS — M5442 Lumbago with sciatica, left side: Secondary | ICD-10-CM | POA: Diagnosis not present

## 2021-09-10 DIAGNOSIS — G8929 Other chronic pain: Secondary | ICD-10-CM | POA: Diagnosis not present

## 2021-09-10 DIAGNOSIS — M4310 Spondylolisthesis, site unspecified: Secondary | ICD-10-CM | POA: Diagnosis not present

## 2021-09-10 DIAGNOSIS — M5416 Radiculopathy, lumbar region: Secondary | ICD-10-CM

## 2021-09-11 ENCOUNTER — Other Ambulatory Visit: Payer: Self-pay | Admitting: Internal Medicine

## 2021-09-25 ENCOUNTER — Other Ambulatory Visit: Payer: Self-pay | Admitting: Internal Medicine

## 2021-10-07 DIAGNOSIS — H2512 Age-related nuclear cataract, left eye: Secondary | ICD-10-CM | POA: Diagnosis not present

## 2021-10-07 DIAGNOSIS — I10 Essential (primary) hypertension: Secondary | ICD-10-CM | POA: Diagnosis not present

## 2021-10-07 DIAGNOSIS — H02884 Meibomian gland dysfunction left upper eyelid: Secondary | ICD-10-CM | POA: Diagnosis not present

## 2021-10-07 DIAGNOSIS — H02882 Meibomian gland dysfunction right lower eyelid: Secondary | ICD-10-CM | POA: Diagnosis not present

## 2021-10-07 DIAGNOSIS — Z961 Presence of intraocular lens: Secondary | ICD-10-CM | POA: Diagnosis not present

## 2021-10-07 DIAGNOSIS — H359 Unspecified retinal disorder: Secondary | ICD-10-CM | POA: Diagnosis not present

## 2021-10-09 ENCOUNTER — Other Ambulatory Visit: Payer: Self-pay | Admitting: Internal Medicine

## 2021-10-23 ENCOUNTER — Other Ambulatory Visit: Payer: Self-pay

## 2021-10-23 ENCOUNTER — Ambulatory Visit (INDEPENDENT_AMBULATORY_CARE_PROVIDER_SITE_OTHER): Payer: Medicare Other | Admitting: Internal Medicine

## 2021-10-23 VITALS — BP 116/72 | HR 71 | Temp 97.9°F | Resp 16 | Ht 68.0 in | Wt 245.2 lb

## 2021-10-23 DIAGNOSIS — E559 Vitamin D deficiency, unspecified: Secondary | ICD-10-CM

## 2021-10-23 DIAGNOSIS — Z860100 Personal history of colon polyps, unspecified: Secondary | ICD-10-CM

## 2021-10-23 DIAGNOSIS — E7800 Pure hypercholesterolemia, unspecified: Secondary | ICD-10-CM | POA: Diagnosis not present

## 2021-10-23 DIAGNOSIS — R739 Hyperglycemia, unspecified: Secondary | ICD-10-CM | POA: Diagnosis not present

## 2021-10-23 DIAGNOSIS — I10 Essential (primary) hypertension: Secondary | ICD-10-CM

## 2021-10-23 DIAGNOSIS — M47816 Spondylosis without myelopathy or radiculopathy, lumbar region: Secondary | ICD-10-CM

## 2021-10-23 DIAGNOSIS — Z85820 Personal history of malignant melanoma of skin: Secondary | ICD-10-CM

## 2021-10-23 DIAGNOSIS — K2270 Barrett's esophagus without dysplasia: Secondary | ICD-10-CM | POA: Diagnosis not present

## 2021-10-23 DIAGNOSIS — E039 Hypothyroidism, unspecified: Secondary | ICD-10-CM | POA: Diagnosis not present

## 2021-10-23 DIAGNOSIS — E538 Deficiency of other specified B group vitamins: Secondary | ICD-10-CM

## 2021-10-23 DIAGNOSIS — Z8546 Personal history of malignant neoplasm of prostate: Secondary | ICD-10-CM

## 2021-10-23 LAB — CBC WITH DIFFERENTIAL/PLATELET
Basophils Absolute: 0.1 10*3/uL (ref 0.0–0.1)
Basophils Relative: 0.6 % (ref 0.0–3.0)
Eosinophils Absolute: 0.1 10*3/uL (ref 0.0–0.7)
Eosinophils Relative: 1.1 % (ref 0.0–5.0)
HCT: 44.2 % (ref 39.0–52.0)
Hemoglobin: 14.8 g/dL (ref 13.0–17.0)
Lymphocytes Relative: 28.7 % (ref 12.0–46.0)
Lymphs Abs: 2.6 10*3/uL (ref 0.7–4.0)
MCHC: 33.4 g/dL (ref 30.0–36.0)
MCV: 92.3 fl (ref 78.0–100.0)
Monocytes Absolute: 1 10*3/uL (ref 0.1–1.0)
Monocytes Relative: 11.5 % (ref 3.0–12.0)
Neutro Abs: 5.2 10*3/uL (ref 1.4–7.7)
Neutrophils Relative %: 58.1 % (ref 43.0–77.0)
Platelets: 260 10*3/uL (ref 150.0–400.0)
RBC: 4.79 Mil/uL (ref 4.22–5.81)
RDW: 13.7 % (ref 11.5–15.5)
WBC: 8.9 10*3/uL (ref 4.0–10.5)

## 2021-10-23 LAB — BASIC METABOLIC PANEL
BUN: 21 mg/dL (ref 6–23)
CO2: 27 mEq/L (ref 19–32)
Calcium: 9.5 mg/dL (ref 8.4–10.5)
Chloride: 103 mEq/L (ref 96–112)
Creatinine, Ser: 1.36 mg/dL (ref 0.40–1.50)
GFR: 47.83 mL/min — ABNORMAL LOW (ref >60.00)
Glucose, Bld: 90 mg/dL (ref 70–99)
Potassium: 4.3 mEq/L (ref 3.5–5.1)
Sodium: 140 mEq/L (ref 135–145)

## 2021-10-23 LAB — LIPID PANEL
Cholesterol: 182 mg/dL (ref 0–200)
HDL: 51.7 mg/dL (ref >39.00)
NonHDL: 130.19
Total CHOL/HDL Ratio: 4
Triglycerides: 290 mg/dL — ABNORMAL HIGH (ref 0.0–149.0)
VLDL: 58 mg/dL — ABNORMAL HIGH (ref 0.0–40.0)

## 2021-10-23 LAB — LDL CHOLESTEROL, DIRECT: Direct LDL: 92 mg/dL

## 2021-10-23 LAB — HEPATIC FUNCTION PANEL
ALT: 25 U/L (ref 0–53)
AST: 20 U/L (ref 0–37)
Albumin: 4.3 g/dL (ref 3.5–5.2)
Alkaline Phosphatase: 71 U/L (ref 39–117)
Bilirubin, Direct: 0.1 mg/dL (ref 0.0–0.3)
Total Bilirubin: 0.4 mg/dL (ref 0.2–1.2)
Total Protein: 7 g/dL (ref 6.0–8.3)

## 2021-10-23 LAB — HEMOGLOBIN A1C: Hgb A1c MFr Bld: 5.6 % (ref 4.6–6.5)

## 2021-10-23 NOTE — Progress Notes (Signed)
[name], male   DOB: 05-May-1935, 87 y.o.   MRN: [id number] ? ? ?Subjective:  ? ? [name], male    DOB: [birth year], 87 y.o.   MRN: [id number] ? ?This visit occurred during the SARS-CoV-2 public health emergency.  Safety protocols were in place, including screening questions prior to the visit, additional usage of staff PPE, and extensive cleaning of exam room while observing appropriate contact time as indicated for disinfecting solutions.  ? ?Patient here for a scheduled follow up.  ? ?Chief Complaint  ?Patient presents with  ? Hyperlipidemia  ? Hypertension  ? .  ? ?HPI ?States his home blood pressures have been averaging 140-150/60-70.  Taking medication as directed.  No chest pain.  Feels breathing is stable.  Some fatigue.  Feels his back is limiting him.  No cough or congestion.  No acid reflux.  No abdominal pain reported.  Bowels moving.  Handling stress.  ? ? ?Past Medical History:  ?Diagnosis Date  ? Acute postoperative pain 03/21/2018  ? Anemia   ? Arthritis   ? Barrett esophagus   ? Cancer [facility])   ? prostate,skin  ? Chicken pox   ? Diverticulitis   ? Dysrhythmia   ? GERD (gastroesophageal reflux disease)   ? Hyperlipidemia   ? Hypertension   ? Hypothyroidism   ? Melanoma ([place])   ? Malignant resection  ? Sleep apnea   ? Ulcer   ? ?Past Surgical History:  ?Procedure Laterality Date  ? CARDIAC CATHETERIZATION    ? Cataract Surgery Right 02/13/14  ? COLON SURGERY  2006-2008-2011  ? polyps removed  ? COLONOSCOPY W/ POLYPECTOMY    ? COLONOSCOPY WITH PROPOFOL N/A 02/15/2018  ? Procedure: COLONOSCOPY WITH PROPOFOL;  Surgeon: Manya Silvas, MD;  Location: [facility] ENDOSCOPY;  Service: Endoscopy;  Laterality: N/A;  ? COLONOSCOPY WITH PROPOFOL N/A 03/24/2020  ? Procedure: COLONOSCOPY WITH PROPOFOL;  Surgeon: Lesly Rubenstein, MD;  Location: [facility] ENDOSCOPY;  Service: Endoscopy;  Laterality: N/A;  ? cystocopy  2003  ? CYSTOSCOPY/RETROGRADE/URETEROSCOPY/STONE EXTRACTION WITH BASKET Right  10/28/2020  ? Procedure: CYSTOSCOPY/RETROGRADE PYELOGRAM/STONE EXTRACTION;  Surgeon: Abbie Sons, MD;  Location: [facility] ORS;  Service: Urology;  Laterality: Right;  ? ESOPHAGOGASTRODUODENOSCOPY (EGD) WITH PROPOFOL N/A 08/04/2016  ? Procedure: ESOPHAGOGASTRODUODENOSCOPY (EGD) WITH PROPOFOL;  Surgeon: Manya Silvas, MD;  Location: [facility] ENDOSCOPY;  Service: Endoscopy;  Laterality: N/A;  ? ESOPHAGOGASTRODUODENOSCOPY (EGD) WITH PROPOFOL N/A 03/24/2020  ? Procedure: ESOPHAGOGASTRODUODENOSCOPY (EGD) WITH PROPOFOL;  Surgeon: Lesly Rubenstein, MD;  Location: [facility] ENDOSCOPY;  Service: Endoscopy;  Laterality: N/A;  ? HEMORRHOID SURGERY    ? PROSTATE SURGERY    ? sleep study    ? ?Family History  ?Problem Relation Age of Onset  ? Liver disease Mother   ? Heart disease Father   ? Kidney disease Father   ? Breast cancer Daughter   ? ?Social History  ? ?Socioeconomic History  ? Marital status: Widowed  ?  Spouse name: Not on file  ? Number of children: Not on file  ? Years of education: Not on file  ? Highest education level: Not on file  ?Occupational History  ? Not on file  ?Tobacco Use  ? Smoking status: Never  ? Smokeless tobacco: Never  ?Vaping Use  ? Vaping Use: Never used  ?Substance and Sexual Activity  ? Alcohol use: No  ?  Alcohol/week: 0.0 standard drinks  ? Drug use: No  ? Sexual activity: Never  ?  Other Topics Concern  ? Not on file  ?Social History Narrative  ? Not on file  ? ?Social Determinants of Health  ? ?Financial Resource Strain: Low Risk   ? Difficulty of Paying Living Expenses: Not hard at all  ?Food Insecurity: No Food Insecurity  ? Worried About Charity fundraiser in the Last Year: Never true  ? Ran Out of Food in the Last Year: Never true  ?Transportation Needs: No Transportation Needs  ? Lack of Transportation (Medical): No  ? Lack of Transportation (Non-Medical): No  ?Physical Activity: Unknown  ? Days of Exercise per Week: 0 days  ? Minutes of Exercise per Session: Not on file  ?Stress: No Stress  Concern Present  ? Feeling of Stress : Not at all  ?Social Connections: Moderately Isolated  ? Frequency of Communication with Friends and Family: More than three times a week  ? Frequency of Social Gatherings with Friends and Family: More than three times a week  ? Attends Religious Services: More than 4 times per year  ? Active Member of Clubs or Organizations: No  ? Attends Archivist Meetings: Not on file  ? Marital Status: Widowed  ? ? ? ?Review of Systems  ?Constitutional:  Positive for fatigue. Negative for appetite change and unexpected weight change.  ?HENT:  Negative for congestion and sinus pressure.   ?Respiratory:  Negative for cough and chest tightness.   ?     Breathing stable.   ?Cardiovascular:  Negative for chest pain, palpitations and leg swelling.  ?Gastrointestinal:  Negative for abdominal pain, diarrhea, nausea and vomiting.  ?Genitourinary:  Negative for difficulty urinating and dysuria.  ?Musculoskeletal:  Negative for joint swelling and myalgias.  ?Skin:  Negative for color change and rash.  ?Neurological:  Negative for dizziness, light-headedness and headaches.  ?Psychiatric/Behavioral:  Negative for agitation and dysphoric mood.   ? ?   ?Objective:  ?  ? ?BP 116/72   Pulse 71   Temp 97.9 ?F (36.6 ?C)   Resp 16   Ht '5\' 8"'$  (1.727 m)   Wt 245 lb 3.2 oz (111.2 kg)   SpO2 98%   BMI 37.28 kg/m?  ?Wt Readings from Last 3 Encounters:  ?10/23/21 245 lb 3.2 oz (111.2 kg)  ?08/27/21 243 lb (110.2 kg)  ?08/12/21 243 lb (110.2 kg)  ? ? ?Physical Exam ?Constitutional:   ?   General: He is not in acute distress. ?   Appearance: Normal appearance. He is well-developed.  ?HENT:  ?   Head: Normocephalic and atraumatic.  ?   Right Ear: External ear normal.  ?   Left Ear: External ear normal.  ?Eyes:  ?   General: No scleral icterus.    ?   Right eye: No discharge.     ?   Left eye: No discharge.  ?Cardiovascular:  ?   Rate and Rhythm: Normal rate and regular rhythm.  ?Pulmonary:  ?   Effort:  Pulmonary effort is normal. No respiratory distress.  ?   Breath sounds: Normal breath sounds.  ?Abdominal:  ?   General: Bowel sounds are normal.  ?   Palpations: Abdomen is soft.  ?   Tenderness: There is no abdominal tenderness.  ?Musculoskeletal:     ?   General: No swelling or tenderness.  ?   Cervical back: Neck supple. No tenderness.  ?Lymphadenopathy:  ?   Cervical: No cervical adenopathy.  ?Skin: ?   Findings: No erythema or rash.  ?Neurological:  ?  Mental Status: He is alert.  ?Psychiatric:     ?   Mood and Affect: Mood normal.     ?   Behavior: Behavior normal.  ? ? ? ?Outpatient Encounter Medications as of 10/23/2021  ?Medication Sig  ? acetaminophen (TYLENOL) 500 MG tablet Take 500 mg by mouth every 6 (six) hours as needed.  ? allopurinol (ZYLOPRIM) 100 MG tablet TAKE 1 TABLET BY MOUTH DAILY  ? amLODipine (NORVASC) 10 MG tablet TAKE 1 TABLET BY MOUTH DAILY  ? aspirin 81 MG tablet Take 81 mg by mouth daily.  ? CALCIUM 600 1500 (600 Ca) MG TABS tablet TAKE 2 TABLETS BY MOUTH EVERY DAY WITH BREAKFAST  ? fluticasone (FLONASE) 50 MCG/ACT nasal spray TWO PUFFS IN EACH NOSTRIL ONCE A DAY (Patient taking differently: Place 2 sprays into both nostrils daily.)  ? hydrALAZINE (APRESOLINE) 50 MG tablet Take 1 tablet (50 mg total) by mouth 3 (three) times daily.  ? irbesartan (AVAPRO) 300 MG tablet TAKE 1 TABLET BY MOUTH DAILY  ? levothyroxine (SYNTHROID) 100 MCG tablet TAKE 1 TABLET BY MOUTH DAILY WITH BREAKFAST  ? omeprazole (PRILOSEC) 20 MG capsule TAKE ONE CAPSULE BY MOUTH TWICE A DAY  ? rosuvastatin (CRESTOR) 5 MG tablet TAKE 1 TABLET BY MOUTH DAILY  ? senna (SENOKOT) 8.6 MG TABS tablet Take 2 tablets by mouth daily.  ? traMADol (ULTRAM) 50 MG tablet Take 1 tablet (50 mg total) by mouth in the morning, at noon, and at bedtime. Each refill must last 30 days.  ? valACYclovir (VALTREX) 1000 MG tablet TAKE 1 TABLET BY MOUTH DAILY  ? [DISCONTINUED] gabapentin (NEURONTIN) 100 MG capsule Take 1 capsule (100 mg total)  by mouth at bedtime. Follow written titration schedule.  ? ?No facility-administered encounter medications on file as of 10/23/2021.  ?  ? ?Lab Results  ?Component Value Date  ? WBC 7.4 02/05/2021  ? HGB 14.9

## 2021-10-24 ENCOUNTER — Encounter: Payer: Self-pay | Admitting: Internal Medicine

## 2021-10-24 NOTE — Assessment & Plan Note (Signed)
Colonoscopy 03/2020 - multiple polyps.  Recommended f/u in one year.  Need to confirm f/u arranged.   ?

## 2021-10-24 NOTE — Assessment & Plan Note (Signed)
Followed by pain clinic.  

## 2021-10-24 NOTE — Assessment & Plan Note (Signed)
Diet and exercise.  Continue blood pressure medication for hypertension.  Continue crestor - hypercholesterolemia.  

## 2021-10-24 NOTE — Assessment & Plan Note (Signed)
Low carb diet and exercise.  Follow met b and a1c.  ?

## 2021-10-24 NOTE — Assessment & Plan Note (Signed)
No upper symptoms.  On prilosec.  Stable. Has been followed by GI.  

## 2021-10-24 NOTE — Assessment & Plan Note (Signed)
Recheck B12 with labs.  ?

## 2021-10-24 NOTE — Assessment & Plan Note (Addendum)
Taking hydralazine, avapro and amlodipine.  Taking hydralazine '50mg'$  tid. Rechecked blood pressure - 132/78.  hold on making changes. Follow pressures.  Follow metabolic panel.  ?

## 2021-10-24 NOTE — Assessment & Plan Note (Signed)
S/p prostatectomy.  Followed by Dr Stoioff.   

## 2021-10-24 NOTE — Assessment & Plan Note (Signed)
Low cholesterol diet and exercise.  Crestor.  Follow lipid panel and liver function tests.   

## 2021-10-27 LAB — VITAMIN B12: Vitamin B-12: 207 pg/mL — ABNORMAL LOW (ref 211–911)

## 2021-10-27 LAB — TSH: TSH: 5.74 u[IU]/mL — ABNORMAL HIGH (ref 0.35–5.50)

## 2021-10-27 LAB — VITAMIN D 25 HYDROXY (VIT D DEFICIENCY, FRACTURES): VITD: 24.73 ng/mL — ABNORMAL LOW (ref 30.00–100.00)

## 2021-11-04 ENCOUNTER — Ambulatory Visit (INDEPENDENT_AMBULATORY_CARE_PROVIDER_SITE_OTHER): Payer: Medicare Other | Admitting: *Deleted

## 2021-11-04 DIAGNOSIS — E538 Deficiency of other specified B group vitamins: Secondary | ICD-10-CM | POA: Diagnosis not present

## 2021-11-04 MED ORDER — CYANOCOBALAMIN 1000 MCG/ML IJ SOLN
1000.0000 ug | Freq: Once | INTRAMUSCULAR | Status: AC
  Administered 2021-11-04: 1000 ug via INTRAMUSCULAR

## 2021-11-04 NOTE — Progress Notes (Signed)
Pt arrived for 1/4 B12 injec. Pt tolerated well. No signs or complaints of distress. ?

## 2021-11-05 ENCOUNTER — Other Ambulatory Visit: Payer: Self-pay | Admitting: Internal Medicine

## 2021-11-05 ENCOUNTER — Other Ambulatory Visit: Payer: Self-pay | Admitting: Family

## 2021-11-11 ENCOUNTER — Ambulatory Visit (INDEPENDENT_AMBULATORY_CARE_PROVIDER_SITE_OTHER): Payer: Medicare Other

## 2021-11-11 DIAGNOSIS — E538 Deficiency of other specified B group vitamins: Secondary | ICD-10-CM | POA: Diagnosis not present

## 2021-11-11 MED ORDER — CYANOCOBALAMIN 1000 MCG/ML IJ SOLN
1000.0000 ug | Freq: Once | INTRAMUSCULAR | Status: AC
  Administered 2021-11-11: 1000 ug via INTRAMUSCULAR

## 2021-11-11 NOTE — Progress Notes (Signed)
Patient presented for B 12 injection to left deltoid, patient voiced no concerns nor showed any signs of distress during injection. 

## 2021-11-18 ENCOUNTER — Ambulatory Visit (INDEPENDENT_AMBULATORY_CARE_PROVIDER_SITE_OTHER): Payer: Medicare Other | Admitting: *Deleted

## 2021-11-18 DIAGNOSIS — E538 Deficiency of other specified B group vitamins: Secondary | ICD-10-CM | POA: Diagnosis not present

## 2021-11-18 MED ORDER — CYANOCOBALAMIN 1000 MCG/ML IJ SOLN
1000.0000 ug | Freq: Once | INTRAMUSCULAR | Status: AC
  Administered 2021-11-18: 1000 ug via INTRAMUSCULAR

## 2021-11-18 NOTE — Progress Notes (Signed)
Patient presented for B 12 injection to right deltoid, patient voiced no concerns nor showed any signs of distress during injection. 

## 2021-11-20 ENCOUNTER — Other Ambulatory Visit: Payer: Self-pay | Admitting: Internal Medicine

## 2021-11-20 DIAGNOSIS — Z85820 Personal history of malignant melanoma of skin: Secondary | ICD-10-CM | POA: Diagnosis not present

## 2021-11-20 DIAGNOSIS — L570 Actinic keratosis: Secondary | ICD-10-CM | POA: Diagnosis not present

## 2021-11-20 DIAGNOSIS — L814 Other melanin hyperpigmentation: Secondary | ICD-10-CM | POA: Diagnosis not present

## 2021-11-20 DIAGNOSIS — L821 Other seborrheic keratosis: Secondary | ICD-10-CM | POA: Diagnosis not present

## 2021-11-20 DIAGNOSIS — D485 Neoplasm of uncertain behavior of skin: Secondary | ICD-10-CM | POA: Diagnosis not present

## 2021-11-20 DIAGNOSIS — D2372 Other benign neoplasm of skin of left lower limb, including hip: Secondary | ICD-10-CM | POA: Diagnosis not present

## 2021-11-20 DIAGNOSIS — L578 Other skin changes due to chronic exposure to nonionizing radiation: Secondary | ICD-10-CM | POA: Diagnosis not present

## 2021-11-20 DIAGNOSIS — D1801 Hemangioma of skin and subcutaneous tissue: Secondary | ICD-10-CM | POA: Diagnosis not present

## 2021-11-23 ENCOUNTER — Ambulatory Visit (INDEPENDENT_AMBULATORY_CARE_PROVIDER_SITE_OTHER): Payer: Medicare Other | Admitting: Internal Medicine

## 2021-11-23 ENCOUNTER — Encounter: Payer: Self-pay | Admitting: Internal Medicine

## 2021-11-23 VITALS — BP 140/80 | HR 57 | Temp 98.3°F | Resp 14 | Ht 68.0 in | Wt 252.0 lb

## 2021-11-23 DIAGNOSIS — K2270 Barrett's esophagus without dysplasia: Secondary | ICD-10-CM | POA: Diagnosis not present

## 2021-11-23 DIAGNOSIS — E039 Hypothyroidism, unspecified: Secondary | ICD-10-CM | POA: Diagnosis not present

## 2021-11-23 DIAGNOSIS — Z86010 Personal history of colonic polyps: Secondary | ICD-10-CM | POA: Diagnosis not present

## 2021-11-23 DIAGNOSIS — Z8546 Personal history of malignant neoplasm of prostate: Secondary | ICD-10-CM

## 2021-11-23 DIAGNOSIS — E7800 Pure hypercholesterolemia, unspecified: Secondary | ICD-10-CM | POA: Diagnosis not present

## 2021-11-23 DIAGNOSIS — I10 Essential (primary) hypertension: Secondary | ICD-10-CM | POA: Diagnosis not present

## 2021-11-23 DIAGNOSIS — R739 Hyperglycemia, unspecified: Secondary | ICD-10-CM

## 2021-11-23 DIAGNOSIS — I499 Cardiac arrhythmia, unspecified: Secondary | ICD-10-CM | POA: Diagnosis not present

## 2021-11-23 DIAGNOSIS — I493 Ventricular premature depolarization: Secondary | ICD-10-CM

## 2021-11-23 DIAGNOSIS — G4730 Sleep apnea, unspecified: Secondary | ICD-10-CM

## 2021-11-23 NOTE — Progress Notes (Signed)
[name], male   DOB: 12-26-1934, 87 y.o.   MRN: [id number] ? ? ?Subjective:  ? ? [name], male    DOB: 15-Jun-1935, 87 y.o.   MRN: [id number] ? ?This visit occurred during the SARS-CoV-2 public health emergency.  Safety protocols were in place, including screening questions prior to the visit, additional usage of staff PPE, and extensive cleaning of exam room while observing appropriate contact time as indicated for disinfecting solutions.  ? ?Patient here for a scheduled follow up.  ? ?Chief Complaint  ?Patient presents with  ? Follow-up  ?  1 mo follow up - BP check  ? .  ? ?HPI ?Blood pressure was elevated previously.  Has not been checking since last visit.  States he has been doing relatively well.  No chest pain reported.  Breathing overall stable.  No increased cough or congestion.  Has felt a little weak - few episodes.  Eating.  Trying to stay hydrated.  No nausea or vomiting.  No bowel change.  States had f/u colonoscopy recently.  Will need to obtain results.  No acid reflux.  No swallowing issues.    ? ? ?Past Medical History:  ?Diagnosis Date  ? Acute postoperative pain 03/21/2018  ? Anemia   ? Arthritis   ? Barrett esophagus   ? Cancer [facility])   ? prostate,skin  ? Chicken pox   ? Diverticulitis   ? Dysrhythmia   ? GERD (gastroesophageal reflux disease)   ? Hyperlipidemia   ? Hypertension   ? Hypothyroidism   ? Melanoma ([place])   ? Malignant resection  ? Sleep apnea   ? Ulcer   ? ?Past Surgical History:  ?Procedure Laterality Date  ? CARDIAC CATHETERIZATION    ? Cataract Surgery Right 02/13/14  ? COLON SURGERY  2006-2008-2011  ? polyps removed  ? COLONOSCOPY W/ POLYPECTOMY    ? COLONOSCOPY WITH PROPOFOL N/A 02/15/2018  ? Procedure: COLONOSCOPY WITH PROPOFOL;  Surgeon: Manya Silvas, MD;  Location: [facility] ENDOSCOPY;  Service: Endoscopy;  Laterality: N/A;  ? COLONOSCOPY WITH PROPOFOL N/A 03/24/2020  ? Procedure: COLONOSCOPY WITH PROPOFOL;  Surgeon: Lesly Rubenstein, MD;   Location: [facility] ENDOSCOPY;  Service: Endoscopy;  Laterality: N/A;  ? cystocopy  2003  ? CYSTOSCOPY/RETROGRADE/URETEROSCOPY/STONE EXTRACTION WITH BASKET Right 10/28/2020  ? Procedure: CYSTOSCOPY/RETROGRADE PYELOGRAM/STONE EXTRACTION;  Surgeon: Abbie Sons, MD;  Location: [facility] ORS;  Service: Urology;  Laterality: Right;  ? ESOPHAGOGASTRODUODENOSCOPY (EGD) WITH PROPOFOL N/A 08/04/2016  ? Procedure: ESOPHAGOGASTRODUODENOSCOPY (EGD) WITH PROPOFOL;  Surgeon: Manya Silvas, MD;  Location: [facility] ENDOSCOPY;  Service: Endoscopy;  Laterality: N/A;  ? ESOPHAGOGASTRODUODENOSCOPY (EGD) WITH PROPOFOL N/A 03/24/2020  ? Procedure: ESOPHAGOGASTRODUODENOSCOPY (EGD) WITH PROPOFOL;  Surgeon: Lesly Rubenstein, MD;  Location: [facility] ENDOSCOPY;  Service: Endoscopy;  Laterality: N/A;  ? HEMORRHOID SURGERY    ? PROSTATE SURGERY    ? sleep study    ? ?Family History  ?Problem Relation Age of Onset  ? Liver disease Mother   ? Heart disease Father   ? Kidney disease Father   ? Breast cancer Daughter   ? ?Social History  ? ?Socioeconomic History  ? Marital status: Widowed  ?  Spouse name: Not on file  ? Number of children: Not on file  ? Years of education: Not on file  ? Highest education level: Not on file  ?Occupational History  ? Not on file  ?Tobacco Use  ? Smoking status: Never  ? Smokeless tobacco: Never  ?  Vaping Use  ? Vaping Use: Never used  ?Substance and Sexual Activity  ? Alcohol use: No  ?  Alcohol/week: 0.0 standard drinks  ? Drug use: No  ? Sexual activity: Never  ?Other Topics Concern  ? Not on file  ?Social History Narrative  ? Not on file  ? ?Social Determinants of Health  ? ?Financial Resource Strain: Low Risk   ? Difficulty of Paying Living Expenses: Not hard at all  ?Food Insecurity: No Food Insecurity  ? Worried About Charity fundraiser in the Last Year: Never true  ? Ran Out of Food in the Last Year: Never true  ?Transportation Needs: No Transportation Needs  ? Lack of Transportation (Medical): No  ? Lack of  Transportation (Non-Medical): No  ?Physical Activity: Unknown  ? Days of Exercise per Week: 0 days  ? Minutes of Exercise per Session: Not on file  ?Stress: No Stress Concern Present  ? Feeling of Stress : Not at all  ?Social Connections: Moderately Isolated  ? Frequency of Communication with Friends and Family: More than three times a week  ? Frequency of Social Gatherings with Friends and Family: More than three times a week  ? Attends Religious Services: More than 4 times per year  ? Active Member of Clubs or Organizations: No  ? Attends Archivist Meetings: Not on file  ? Marital Status: Widowed  ? ? ? ?Review of Systems  ?Constitutional:  Positive for fatigue. Negative for appetite change and unexpected weight change.  ?HENT:  Negative for congestion and sinus pressure.   ?Respiratory:  Negative for cough, chest tightness and shortness of breath.   ?Cardiovascular:  Negative for chest pain, palpitations and leg swelling.  ?Gastrointestinal:  Negative for abdominal pain, diarrhea, nausea and vomiting.  ?Genitourinary:  Negative for difficulty urinating and dysuria.  ?Musculoskeletal:  Negative for joint swelling and myalgias.  ?Skin:  Negative for color change and rash.  ?Neurological:  Negative for dizziness and headaches.  ?Psychiatric/Behavioral:  Negative for agitation and dysphoric mood.   ? ?   ?Objective:  ?  ? ?BP 140/80 (BP Location: Left Arm, Patient Position: Sitting, Cuff Size: Large)   Pulse (!) 57   Temp 98.3 ?F (36.8 ?C) (Temporal)   Resp 14   Ht '5\' 8"'$  (1.727 m)   Wt 252 lb (114.3 kg)   SpO2 99%   BMI 38.32 kg/m?  ?Wt Readings from Last 3 Encounters:  ?11/23/21 252 lb (114.3 kg)  ?10/23/21 245 lb 3.2 oz (111.2 kg)  ?08/27/21 243 lb (110.2 kg)  ? ? ?Physical Exam ?Constitutional:   ?   General: He is not in acute distress. ?   Appearance: Normal appearance. He is well-developed.  ?HENT:  ?   Head: Normocephalic and atraumatic.  ?   Right Ear: External ear normal.  ?   Left Ear:  External ear normal.  ?Eyes:  ?   General: No scleral icterus.    ?   Right eye: No discharge.     ?   Left eye: No discharge.  ?Cardiovascular:  ?   Rate and Rhythm: Normal rate and regular rhythm.  ?Pulmonary:  ?   Effort: Pulmonary effort is normal. No respiratory distress.  ?   Breath sounds: Normal breath sounds.  ?Abdominal:  ?   General: Bowel sounds are normal.  ?   Palpations: Abdomen is soft.  ?   Tenderness: There is no abdominal tenderness.  ?Musculoskeletal:     ?  General: No swelling or tenderness.  ?   Cervical back: Neck supple. No tenderness.  ?Lymphadenopathy:  ?   Cervical: No cervical adenopathy.  ?Skin: ?   Findings: No erythema or rash.  ?Neurological:  ?   Mental Status: He is alert.  ?Psychiatric:     ?   Mood and Affect: Mood normal.     ?   Behavior: Behavior normal.  ? ? ? ?Outpatient Encounter Medications as of 11/23/2021  ?Medication Sig  ? acetaminophen (TYLENOL) 500 MG tablet Take 500 mg by mouth every 6 (six) hours as needed.  ? allopurinol (ZYLOPRIM) 100 MG tablet TAKE 1 TABLET BY MOUTH DAILY  ? amLODipine (NORVASC) 10 MG tablet TAKE 1 TABLET BY MOUTH DAILY  ? aspirin 81 MG tablet Take 81 mg by mouth daily.  ? CALCIUM 600 1500 (600 Ca) MG TABS tablet TAKE 2 TABLETS BY MOUTH EVERY DAY WITH BREAKFAST  ? fluticasone (FLONASE) 50 MCG/ACT nasal spray TWO PUFFS IN EACH NOSTRIL ONCE A DAY (Patient taking differently: Place 2 sprays into both nostrils daily.)  ? irbesartan (AVAPRO) 300 MG tablet TAKE 1 TABLET BY MOUTH DAILY  ? rosuvastatin (CRESTOR) 5 MG tablet TAKE 1 TABLET BY MOUTH DAILY  ? senna (SENOKOT) 8.6 MG TABS tablet Take 2 tablets by mouth daily.  ? traMADol (ULTRAM) 50 MG tablet Take 1 tablet (50 mg total) by mouth in the morning, at noon, and at bedtime. Each refill must last 30 days.  ? valACYclovir (VALTREX) 1000 MG tablet TAKE 1 TABLET BY MOUTH DAILY  ? [DISCONTINUED] hydrALAZINE (APRESOLINE) 50 MG tablet Take 1 tablet (50 mg total) by mouth 3 (three) times daily.  ?  [DISCONTINUED] levothyroxine (SYNTHROID) 100 MCG tablet TAKE 1 TABLET BY MOUTH DAILY WITH BREAKFAST  ? [DISCONTINUED] omeprazole (PRILOSEC) 20 MG capsule TAKE ONE CAPSULE BY MOUTH TWICE A DAY  ? ?No facility-administered encounter medi

## 2021-11-25 ENCOUNTER — Ambulatory Visit (INDEPENDENT_AMBULATORY_CARE_PROVIDER_SITE_OTHER): Payer: Medicare Other

## 2021-11-25 DIAGNOSIS — E538 Deficiency of other specified B group vitamins: Secondary | ICD-10-CM

## 2021-11-25 MED ORDER — CYANOCOBALAMIN 1000 MCG/ML IJ SOLN
1000.0000 ug | Freq: Once | INTRAMUSCULAR | Status: AC
  Administered 2021-11-25: 1000 ug via INTRAMUSCULAR

## 2021-11-25 NOTE — Progress Notes (Signed)
Patient presented for B 12 injection to left deltoid, patient voiced no concerns nor showed any signs of distress during injection. 

## 2021-11-29 ENCOUNTER — Encounter: Payer: Self-pay | Admitting: Internal Medicine

## 2021-11-29 DIAGNOSIS — I499 Cardiac arrhythmia, unspecified: Secondary | ICD-10-CM | POA: Insufficient documentation

## 2021-11-29 NOTE — Assessment & Plan Note (Signed)
Low cholesterol diet and exercise.  Crestor.  Follow lipid panel and liver function tests.   

## 2021-11-29 NOTE — Assessment & Plan Note (Signed)
Has known sleep apnea.  Unable to tolerate cpap.  Discussed.  Desires no further intervention at this time.  Follow  

## 2021-11-29 NOTE — Assessment & Plan Note (Signed)
S/p prostatectomy.  Followed by Dr Stoioff.   

## 2021-11-29 NOTE — Assessment & Plan Note (Signed)
Noted on exam.  EKG - SR with PVCs.  Intermittent weak spells as outlined.  Given symptoms - fatigue and weak spells and frequent PVCs, schedule f/u with cardiology for further evaluation and question of need for further w/up.  ?

## 2021-11-29 NOTE — Assessment & Plan Note (Signed)
Taking hydralazine, avapro and amlodipine.  Taking hydralazine '50mg'$  tid. Blood pressure on my check ok.  Follow pressures.  Follow metabolic panel.  ?

## 2021-11-29 NOTE — Assessment & Plan Note (Signed)
Colonoscopy 03/2020 - multiple polyps.  Recommended f/u in one year.  Need to confirm f/u arranged.   ?

## 2021-11-29 NOTE — Assessment & Plan Note (Signed)
No upper symptoms.  On prilosec.  Stable. Has been followed by GI.  

## 2021-11-29 NOTE — Assessment & Plan Note (Signed)
On thyroid replacement.  Follow tsh.  

## 2021-11-29 NOTE — Assessment & Plan Note (Signed)
Low carb diet and exercise.  Follow met b and a1c.  ?

## 2021-12-14 ENCOUNTER — Other Ambulatory Visit: Payer: Medicare Other

## 2021-12-14 ENCOUNTER — Other Ambulatory Visit: Payer: Self-pay | Admitting: Internal Medicine

## 2021-12-14 ENCOUNTER — Ambulatory Visit: Payer: Medicare Other

## 2021-12-14 ENCOUNTER — Ambulatory Visit (INDEPENDENT_AMBULATORY_CARE_PROVIDER_SITE_OTHER): Payer: Medicare Other

## 2021-12-14 DIAGNOSIS — E538 Deficiency of other specified B group vitamins: Secondary | ICD-10-CM

## 2021-12-14 MED ORDER — CYANOCOBALAMIN 1000 MCG/ML IJ SOLN
1000.0000 ug | Freq: Once | INTRAMUSCULAR | Status: AC
  Administered 2021-12-14: 1000 ug via INTRAMUSCULAR

## 2021-12-14 NOTE — Progress Notes (Signed)
Patient presented for B 12 injection to left deltoid, patient voiced no concerns nor showed any signs of distress during injection. 

## 2022-01-01 ENCOUNTER — Other Ambulatory Visit: Payer: Self-pay | Admitting: Internal Medicine

## 2022-01-04 ENCOUNTER — Ambulatory Visit: Payer: Medicare Other | Admitting: Cardiology

## 2022-01-04 ENCOUNTER — Encounter: Payer: Self-pay | Admitting: Cardiology

## 2022-01-04 VITALS — BP 180/80 | HR 87 | Ht 68.0 in | Wt 255.0 lb

## 2022-01-04 DIAGNOSIS — R0683 Snoring: Secondary | ICD-10-CM | POA: Diagnosis not present

## 2022-01-04 DIAGNOSIS — R072 Precordial pain: Secondary | ICD-10-CM

## 2022-01-04 DIAGNOSIS — Z6838 Body mass index (BMI) 38.0-38.9, adult: Secondary | ICD-10-CM

## 2022-01-04 DIAGNOSIS — R0609 Other forms of dyspnea: Secondary | ICD-10-CM

## 2022-01-04 DIAGNOSIS — I10 Essential (primary) hypertension: Secondary | ICD-10-CM

## 2022-01-04 NOTE — Progress Notes (Signed)
Cardiology Office Note:    Date:  01/04/2022   ID:  Hector [name], DOB 12-28-1934, MRN [id number]  PCP:  Einar Pheasant, MD   Hector Providers Cardiologist:  Kate Sable, MD     Referring MD: Einar Pheasant, MD   Chief Complaint  Patient presents with   NEW patient- Ref by Dr. Nicki Reaper for irregular hear rhythm    Symptoms: Extremely Tired, light headedness, weakness in arms and legs, shortness of breath, chest pains, legs and feet hurting. Meds reviewed with patient.    History of Present Illness:    Hector [name] is a 87 y.o. male with a hx of hypertension, hyperlipidemia presenting due to shortness of breath, chest pain.  Patient saw PCP 11/23/2021 for surveillance visit, irregular heart rhythm noted on exam.  EKG obtained showed sinus rhythm with occasional PVCs.  Also has some symptoms of fatigue, snoring, daytime somnolence.  Has put on about 25 pounds over the past year, since his wife passed away.  Has occasional chest pain, dyspnea usually with exertion.  Blood pressure usually controlled at home, states BP is elevated this AM due to walking from parking lot to the appointment.  Denies smoking, denies prior history of heart disease.  Past Medical History:  Diagnosis Date   Acute postoperative pain 03/21/2018   Anemia    Arthritis    Barrett esophagus    Cancer ([place])    prostate,skin   Chicken pox    Diverticulitis    Dysrhythmia    GERD (gastroesophageal reflux disease)    Hyperlipidemia    Hypertension    Hypothyroidism    Melanoma ([place])    Malignant resection   Sleep apnea    Ulcer     Past Surgical History:  Procedure Laterality Date   CARDIAC CATHETERIZATION     Cataract Surgery Right 02/13/14   COLON SURGERY  2006-2008-2011   polyps removed   COLONOSCOPY W/ POLYPECTOMY     COLONOSCOPY WITH PROPOFOL N/A 02/15/2018   Procedure: COLONOSCOPY WITH PROPOFOL;  Surgeon: Manya Silvas, MD;  Location: [facility] ENDOSCOPY;  Service: Endoscopy;  Laterality:  N/A;   COLONOSCOPY WITH PROPOFOL N/A 03/24/2020   Procedure: COLONOSCOPY WITH PROPOFOL;  Surgeon: Lesly Rubenstein, MD;  Location: [facility] ENDOSCOPY;  Service: Endoscopy;  Laterality: N/A;   cystocopy  2003   CYSTOSCOPY/RETROGRADE/URETEROSCOPY/STONE EXTRACTION WITH BASKET Right 10/28/2020   Procedure: CYSTOSCOPY/RETROGRADE PYELOGRAM/STONE EXTRACTION;  Surgeon: Abbie Sons, MD;  Location: [facility] ORS;  Service: Urology;  Laterality: Right;   ESOPHAGOGASTRODUODENOSCOPY (EGD) WITH PROPOFOL N/A 08/04/2016   Procedure: ESOPHAGOGASTRODUODENOSCOPY (EGD) WITH PROPOFOL;  Surgeon: Manya Silvas, MD;  Location: [facility] ENDOSCOPY;  Service: Endoscopy;  Laterality: N/A;   ESOPHAGOGASTRODUODENOSCOPY (EGD) WITH PROPOFOL N/A 03/24/2020   Procedure: ESOPHAGOGASTRODUODENOSCOPY (EGD) WITH PROPOFOL;  Surgeon: Lesly Rubenstein, MD;  Location: [facility] ENDOSCOPY;  Service: Endoscopy;  Laterality: N/A;   HEMORRHOID SURGERY     PROSTATE SURGERY     sleep study      Current Medications: Current Meds  Medication Sig   acetaminophen (TYLENOL) 500 MG tablet Take 500 mg by mouth every 6 (six) hours as needed.   allopurinol (ZYLOPRIM) 100 MG tablet TAKE 1 TABLET BY MOUTH DAILY   amLODipine (NORVASC) 10 MG tablet TAKE 1 TABLET BY MOUTH DAILY   aspirin 81 MG tablet Take 81 mg by mouth daily.   CALCIUM 600 1500 (600 Ca) MG TABS tablet TAKE 2 TABLETS BY MOUTH EVERY DAY WITH BREAKFAST   fluticasone (FLONASE) 50 MCG/ACT  nasal spray TWO PUFFS IN EACH NOSTRIL ONCE A DAY (Patient taking differently: Place 2 sprays into both nostrils daily.)   hydrALAZINE (APRESOLINE) 50 MG tablet TAKE ONE TABLET BY MOUTH THREE TIMES A DAY.   irbesartan (AVAPRO) 300 MG tablet TAKE 1 TABLET BY MOUTH DAILY   levothyroxine (SYNTHROID) 100 MCG tablet TAKE 1 TABLET BY MOUTH DAILY WITH BREAKFAST   LYSINE PO Take 1 tablet by mouth daily.   omeprazole (PRILOSEC) 20 MG capsule TAKE ONE CAPSULE BY MOUTH TWICE A DAY   senna (SENOKOT) 8.6 MG TABS tablet Take  2 tablets by mouth daily.   traMADol (ULTRAM) 50 MG tablet Take 1 tablet (50 mg total) by mouth in the morning, at noon, and at bedtime. Each refill must last 30 days.   valACYclovir (VALTREX) 1000 MG tablet TAKE 1 TABLET BY MOUTH DAILY   VITAMIN D PO Take 1 tablet by mouth daily.     Allergies:   Tape   Social History   Socioeconomic History   Marital status: Widowed    Spouse name: Not on file   Number of children: Not on file   Years of education: Not on file   Highest education level: Not on file  Occupational History   Not on file  Tobacco Use   Smoking status: Never   Smokeless tobacco: Never  Vaping Use   Vaping Use: Never used  Substance and Sexual Activity   Alcohol use: No    Alcohol/week: 0.0 standard drinks   Drug use: No   Sexual activity: Never  Other Topics Concern   Not on file  Social History Narrative   Not on file   Social Determinants of Health   Financial Resource Strain: Low Risk    Difficulty of Paying Living Expenses: Not hard at all  Food Insecurity: No Food Insecurity   Worried About Charity fundraiser in the Last Year: Never true   [place] in the Last Year: Never true  Transportation Needs: No Transportation Needs   Lack of Transportation (Medical): No   Lack of Transportation (Non-Medical): No  Physical Activity: Unknown   Days of Exercise per Week: 0 days   Minutes of Exercise per Session: Not on file  Stress: No Stress Concern Present   Feeling of Stress : Not at all  Social Connections: Moderately Isolated   Frequency of Communication with Friends and Family: More than three times a week   Frequency of Social Gatherings with Friends and Family: More than three times a week   Attends Religious Services: More than 4 times per year   Active Member of [employer] or Organizations: No   Attends Archivist Meetings: Not on file   Marital Status: Widowed     Family History: The patient's family history includes Breast  cancer in his daughter; Heart disease in his father; Kidney disease in his father; Liver disease in his mother.  ROS:   Please see the history of present illness.     All other systems reviewed and are negative.  EKGs/Labs/Other Studies Reviewed:    The following studies were reviewed today:   EKG:  EKG is  ordered today.  The ekg ordered today demonstrates sinus rhythm, frequent PACs  Recent Labs: 10/23/2021: ALT 25; BUN 21; Creatinine, Ser 1.36; Hemoglobin 14.8; Platelets 260.0; Potassium 4.3; Sodium 140; TSH 5.74  Recent Lipid Panel    Component Value Date/Time   CHOL 182 10/23/2021 1137   TRIG 290.0 (H) 10/23/2021  1137   HDL 51.70 10/23/2021 1137   CHOLHDL 4 10/23/2021 1137   VLDL 58.0 (H) 10/23/2021 1137   LDLCALC 54 02/05/2021 0905   LDLDIRECT 92.0 10/23/2021 1137     Risk Assessment/Calculations:          Physical Exam:    VS:  BP (!) 180/80 (BP Location: Right Arm, Patient Position: Sitting, Cuff Size: Large)   Pulse 87   Ht '5\' 8"'$  (1.727 m)   Wt 255 lb (115.7 kg)   SpO2 98%   BMI 38.77 kg/m     Wt Readings from Last 3 Encounters:  01/04/22 255 lb (115.7 kg)  11/23/21 252 lb (114.3 kg)  10/23/21 245 lb 3.2 oz (111.2 kg)     GEN:  Well nourished, well developed in no acute distress HEENT: Normal NECK: No JVD; No carotid bruits CARDIAC: RRR, no murmurs, rubs, gallops RESPIRATORY:  Clear to auscultation without rales, wheezing or rhonchi  ABDOMEN: Soft, non-tender, non-distended MUSCULOSKELETAL:  No edema; No deformity  SKIN: Warm and dry NEUROLOGIC:  Alert and oriented x 3 PSYCHIATRIC:  Normal affect   ASSESSMENT:    1. Dyspnea on exertion   2. Precordial pain   3. Primary hypertension   4. Snoring   5. BMI 38.0-38.9,adult    PLAN:    In order of problems listed above:  Dyspnea on exertion, chest pain.  Symptoms likely from obesity, deconditioning, untreated OSA.  Has risk factors for cardiac disease.  Get echo and Myoview to rule out any  cardiac etiology. Hypertension, BP elevated today, usually controlled.  Continue hydralazine, irbesartan. Snoring, daytime somnolence, history of OSA.  Refer to sleep specialist for eval and treatment. Obesity, low-calorie diet, weight loss advised.  Follow-up after cardiac testing.     Shared Decision Making/Informed Consent The risks [chest pain, shortness of breath, cardiac arrhythmias, dizziness, blood pressure fluctuations, myocardial infarction, stroke/transient ischemic attack, nausea, vomiting, allergic reaction, radiation exposure, metallic taste sensation and life-threatening complications (estimated to be 1 in 10,000)], benefits (risk stratification, diagnosing coronary artery disease, treatment guidance) and alternatives of a nuclear stress test were discussed in detail with Mr. Scobie and he agrees to proceed.   Medication Adjustments/Labs and Tests Ordered: Current medicines are reviewed at length with the patient today.  Concerns regarding medicines are outlined above.  Orders Placed This Encounter  Procedures   NM Myocar Multi W/Spect W/Wall Motion / EF   Ambulatory referral to Pulmonology   EKG 12-Lead   ECHOCARDIOGRAM COMPLETE   No orders of the defined types were placed in this encounter.   Patient Instructions  Medication Instructions:  Your physician recommends that you continue on your current medications as directed. Please refer to the Current Medication list given to you today.  *If you need a refill on your cardiac medications before your next appointment, please call your pharmacy*   Testing/Procedures:  Your physician has requested that you have an echocardiogram. Echocardiography is a painless test that uses sound waves to create images of your heart. It provides your doctor with information about the size and shape of your heart and how well your heart's chambers and valves are working. This procedure takes approximately one hour. There are no restrictions  for this procedure.   2.    [place]     Your caregiver has ordered a Stress Test with nuclear imaging. The purpose of this test is to evaluate the blood supply to your heart muscle. This procedure is referred to as a "Non-Invasive  Stress Test." This is because other than having an IV started in your vein, nothing is inserted or "invades" your body. Cardiac stress tests are done to find areas of poor blood flow to the heart by determining the extent of coronary artery disease (CAD). Some patients exercise on a treadmill, which naturally increases the blood flow to your heart, while others who are  unable to walk on a treadmill due to physical limitations have a pharmacologic/chemical stress agent called Lexiscan . This medicine will mimic walking on a treadmill by temporarily increasing your coronary blood flow.      PLEASE REPORT TO [facility] MEDICAL MALL ENTRANCE   THE VOLUNTEERS AT THE FIRST DESK WILL DIRECT YOU WHERE TO GO    *Please note: these test may take anywhere between 2-4 hours to complete      Date of Procedure:_____________________________________   Arrival Time for Procedure:______________________________    PLEASE NOTIFY THE OFFICE AT LEAST 24 HOURS IN ADVANCE IF YOU ARE UNABLE TO KEEP YOUR APPOINTMENT.  [place] 24 HOURS IN ADVANCE IF YOU ARE UNABLE TO KEEP YOUR APPOINTMENT. [phone]      How to prepare for your Myoview test:    1. Do not eat or drink after midnight  2. No caffeine for 24 hours prior to test  3. No smoking 24 hours prior to test.  4. Unless instructed otherwise, Take your medication with a small sips of water.        Follow-Up: At [facility], you and your health needs are our priority.  As part of our continuing mission to provide you with exceptional heart care, we have created designated Provider Care Teams.  These Care Teams include your primary Cardiologist (physician) and Advanced  Practice Providers (APPs -  Physician Assistants and Nurse Practitioners) who all work together to provide you with the care you need, when you need it.  We recommend signing up for the patient portal called "MyChart".  Sign up information is provided on this After Visit Summary.  MyChart is used to connect with patients for Virtual Visits (Telemedicine).  Patients are able to view lab/test results, encounter notes, upcoming appointments, etc.  Non-urgent messages can be sent to your provider as well.   To learn more about what you can do with MyChart, go to [email or web address].    Your next appointment:   Follow up after testing   The format for your next appointment:   In Person  Provider:   You may see Kate Sable, MD or one of the following Advanced Practice Providers on your designated Care Team:   Murray Hodgkins, NP Christell Faith, PA-C Cadence Kathlen Mody, [place]    Other Instructions   Important Information About Sugar         Signed, Kate Sable, MD  01/04/2022 12:42 PM    [place]

## 2022-01-04 NOTE — Patient Instructions (Signed)
Medication Instructions:  Your physician recommends that you continue on your current medications as directed. Please refer to the Current Medication list given to you today.  *If you need a refill on your cardiac medications before your next appointment, please call your pharmacy*   Testing/Procedures:  Your physician has requested that you have an echocardiogram. Echocardiography is a painless test that uses sound waves to create images of your heart. It provides your doctor with information about the size and shape of your heart and how well your heart's chambers and valves are working. This procedure takes approximately one hour. There are no restrictions for this procedure.   2.    [place]     Your caregiver has ordered a Stress Test with nuclear imaging. The purpose of this test is to evaluate the blood supply to your heart muscle. This procedure is referred to as a "Non-Invasive Stress Test." This is because other than having an IV started in your vein, nothing is inserted or "invades" your body. Cardiac stress tests are done to find areas of poor blood flow to the heart by determining the extent of coronary artery disease (CAD). Some patients exercise on a treadmill, which naturally increases the blood flow to your heart, while others who are  unable to walk on a treadmill due to physical limitations have a pharmacologic/chemical stress agent called Lexiscan . This medicine will mimic walking on a treadmill by temporarily increasing your coronary blood flow.      PLEASE REPORT TO [facility]   THE VOLUNTEERS AT THE FIRST DESK WILL DIRECT YOU WHERE TO GO    *Please note: these test may take anywhere between 2-4 hours to complete      Date of Procedure:_____________________________________   Arrival Time for Procedure:______________________________    PLEASE NOTIFY THE OFFICE AT LEAST 24 HOURS IN ADVANCE IF YOU ARE UNABLE TO KEEP YOUR APPOINTMENT.  [place] 24 HOURS IN ADVANCE IF YOU ARE UNABLE TO KEEP YOUR APPOINTMENT. [phone]      How to prepare for your Myoview test:    1. Do not eat or drink after midnight  2. No caffeine for 24 hours prior to test  3. No smoking 24 hours prior to test.  4. Unless instructed otherwise, Take your medication with a small sips of water.        Follow-Up: At [facility], you and your health needs are our priority.  As part of our continuing mission to provide you with exceptional heart care, we have created designated Provider Care Teams.  These Care Teams include your primary Cardiologist (physician) and Advanced Practice Providers (APPs -  Physician Assistants and Nurse Practitioners) who all work together to provide you with the care you need, when you need it.  We recommend signing up for the patient portal called "MyChart".  Sign up information is provided on this After Visit Summary.  MyChart is used to connect with patients for Virtual Visits (Telemedicine).  Patients are able to view lab/test results, encounter notes, upcoming appointments, etc.  Non-urgent messages can be sent to your provider as well.   To learn more about what you can do with MyChart, go to [email or web address].    Your next appointment:   Follow up after testing   The format for your next appointment:   In Person  Provider:   You may see Kate Sable, MD or one of the following Advanced Practice  Providers on your designated Care Team:   Murray Hodgkins, NP Christell Faith, PA-C Cadence Kathlen Mody, [place]    Other Instructions   Important Information About Sugar

## 2022-01-07 ENCOUNTER — Ambulatory Visit (INDEPENDENT_AMBULATORY_CARE_PROVIDER_SITE_OTHER): Payer: Medicare Other

## 2022-01-07 DIAGNOSIS — R072 Precordial pain: Secondary | ICD-10-CM

## 2022-01-07 DIAGNOSIS — R0609 Other forms of dyspnea: Secondary | ICD-10-CM

## 2022-01-07 LAB — ECHOCARDIOGRAM COMPLETE
AR max vel: 1.35 cm2
AV Area VTI: 1.46 cm2
AV Area mean vel: 1.45 cm2
AV Mean grad: 6 mmHg
AV Peak grad: 12 mmHg
Ao pk vel: 1.73 m/s
Area-P 1/2: 3.81 cm2
S' Lateral: 2.93 cm

## 2022-01-07 MED ORDER — PERFLUTREN LIPID MICROSPHERE
1.0000 mL | INTRAVENOUS | Status: AC | PRN
  Administered 2022-01-07: 2 mL via INTRAVENOUS

## 2022-01-08 ENCOUNTER — Telehealth: Payer: Self-pay

## 2022-01-08 NOTE — Telephone Encounter (Signed)
v

## 2022-01-08 NOTE — Telephone Encounter (Signed)
-----   Message from Kate Sable, MD sent at 01/07/2022  4:36 PM EDT ----- Echocardiogram shows normal systolic function.  There is relaxation abnormality which is likely from history of hypertension, and being overweight.  Continue medications as prescribed, follow recommendations as discussed during clinic visit.

## 2022-01-11 ENCOUNTER — Encounter
Admission: RE | Admit: 2022-01-11 | Discharge: 2022-01-11 | Disposition: A | Discharge status: Home / Self Care | Payer: Medicare Other | Source: Ambulatory Visit | Attending: Cardiology | Admitting: Cardiology

## 2022-01-11 DIAGNOSIS — R072 Precordial pain: Secondary | ICD-10-CM | POA: Diagnosis not present

## 2022-01-11 LAB — NM MYOCAR MULTI W/SPECT W/WALL MOTION / EF
Base ST Depression (mm): 0 mm
LV dias vol: 137 mL (ref 62–150)
LV sys vol: 58 mL
Nuc Stress EF: 58 %
Peak HR: 93 {beats}/min
Percent HR: 68 %
Rest HR: 75 {beats}/min
Rest Nuclear Isotope Dose: 10.6 mCi
SDS: 6
SRS: 0
SSS: 2
ST Depression (mm): 0 mm
Stress Nuclear Isotope Dose: 31.3 mCi
TID: 1.19

## 2022-01-11 MED ORDER — TECHNETIUM TC 99M TETROFOSMIN IV KIT
31.2800 | PACK | Freq: Once | INTRAVENOUS | Status: AC | PRN
  Administered 2022-01-11: 31.28 via INTRAVENOUS

## 2022-01-11 MED ORDER — REGADENOSON 0.4 MG/5ML IV SOLN
0.4000 mg | Freq: Once | INTRAVENOUS | Status: AC
Start: 2022-01-11 — End: 2022-01-11
  Administered 2022-01-11: 0.4 mg via INTRAVENOUS

## 2022-01-11 MED ORDER — TECHNETIUM TC 99M TETROFOSMIN IV KIT
10.0000 | PACK | Freq: Once | INTRAVENOUS | Status: AC | PRN
  Administered 2022-01-11: 10.6 via INTRAVENOUS

## 2022-01-14 ENCOUNTER — Ambulatory Visit (INDEPENDENT_AMBULATORY_CARE_PROVIDER_SITE_OTHER): Payer: Medicare Other

## 2022-01-14 DIAGNOSIS — E538 Deficiency of other specified B group vitamins: Secondary | ICD-10-CM | POA: Diagnosis not present

## 2022-01-14 MED ORDER — CYANOCOBALAMIN 1000 MCG/ML IJ SOLN
1000.0000 ug | Freq: Once | INTRAMUSCULAR | Status: AC
  Administered 2022-01-14: 1000 ug via INTRAMUSCULAR

## 2022-01-14 NOTE — Progress Notes (Signed)
Patient presented for B 12 injection to left deltoid, patient voiced no concerns nor showed any signs of distress during injection. 

## 2022-01-22 ENCOUNTER — Encounter: Payer: Self-pay | Admitting: Cardiology

## 2022-01-22 ENCOUNTER — Ambulatory Visit: Payer: Medicare Other | Admitting: Cardiology

## 2022-01-22 VITALS — BP 130/66 | HR 57 | Ht 68.0 in | Wt 257.0 lb

## 2022-01-22 DIAGNOSIS — I10 Essential (primary) hypertension: Secondary | ICD-10-CM | POA: Diagnosis not present

## 2022-01-22 DIAGNOSIS — Z6839 Body mass index (BMI) 39.0-39.9, adult: Secondary | ICD-10-CM | POA: Diagnosis not present

## 2022-01-22 DIAGNOSIS — R0609 Other forms of dyspnea: Secondary | ICD-10-CM

## 2022-01-25 NOTE — Progress Notes (Signed)
PROVIDER NOTE: Information contained herein reflects review and annotations entered in association with encounter. Interpretation of such information and data should be left to medically-trained personnel. Information provided to patient can be located elsewhere in the medical record under "Patient Instructions". Document created using STT-dictation technology, any transcriptional errors that may result from process are unintentional.    Patient: Hector [name]  Service Category: E/M  Provider: Gaspar Cola, MD  DOB: 03/25/1935  DOS: 01/27/2022  Specialty: Interventional Pain Management  MRN: [id number]  Setting: Ambulatory outpatient  PCP: Einar Pheasant, MD  Type: Established Patient    Referring Provider: Einar Pheasant, MD  Location: Office  Delivery: Face-to-face     HPI  [name], a 87 y.o. year old male, is here today because of his Chronic pain syndrome [G89.4]. Mr. Toney primary complain today is Back Pain (lower) Last encounter: My last encounter with him was on 09/10/2021. Pertinent problems: [name] has History of prostate cancer; Shoulder pain, left; Chronic hip pain (Left); Chronic low back pain (1ry area of Pain) (Bilateral) (L>R) w/ sciatica (Left); Chronic pain syndrome; Polyarthritis, unspecified (Secondary Area of Pain); L5-S1 bilateral pars defect with spondylolisthesis; Lumbosacral Grade 1 Anterolisthesis of L5 over S1; Lumbar facet arthropathy (Bilateral); Lumbar foraminal stenosis (L5-S1) (Bilateral) (L>R); Chronic Lumbar radiculitis (L5) (Left); Chronic musculoskeletal pain; Lumbar facet syndrome (Bilateral) (L>R); Chronic lower extremity pain (2ry area of Pain) (Left); Chronic lower extremity radicular pain (L5) (Left); Lumbar facet osteoarthritis (Bilateral); Osteoarthritis of lumbar spine; Lumbar spondylosis; Spondylosis without myelopathy or radiculopathy, lumbosacral region; Chronic right shoulder pain; DDD (degenerative disc disease), lumbosacral; Gout; Chronic  low back pain (Bilateral) w/o sciatica; and Neurogenic pain on their pertinent problem list. Pain Assessment: Severity of Chronic pain is reported as a 2 /10. Location: Back Lower/pain is in right leg, weak at times. Onset: More than a month ago. Quality: Constant, Aching, Discomfort (weakness). Timing: Intermittent. Modifying factor(s): Meds, pain spray (stop pain). Vitals:  height is _0  (1.727 m) and weight is 257 lb (116.6 kg). His temperature is 98.4 F (36.9 C). His blood pressure is 144/63 (abnormal) and his pulse is 66. His oxygen saturation is 95%.   Reason for encounter: medication management.  The patient indicates doing well with the current medication regimen. No adverse reactions or side effects reported to the medications.   The patient indicates occasionally having right lower extremity numbness and weakness.  He does have a documented grade 1 anterolisthesis of L5 over S1 with moderate to severe foraminal stenosis, which could account for the symptoms that he has been experiencing.  He is currently being worked up for heart problems.  For the time being, we will continue to treat him conservatively.  However, if he has a flareup of this pain we will consider either LESI or transforaminal ESI.  UDS ordered today.   RTCB: 08/07/2022 Nonopioids transferred 05/07/2020: Calcium and vitamin D3  Pharmacotherapy Assessment  Analgesic: Tramadol 50 mg, 1 tab PO q 8 hrs (150 mg/day of tramadol) MME/day: 15 mg/day.   Monitoring: [place] PMP: PDMP reviewed during this encounter.       Pharmacotherapy: No side-effects or adverse reactions reported. Compliance: No problems identified. Effectiveness: Clinically acceptable.  Chauncey Fischer, RN  01/27/2022 11:43 AM  Sign when Signing Visit Nursing Pain Medication Assessment:  Safety precautions to be maintained throughout the outpatient stay will include: orient to surroundings, keep bed in low position, maintain call bell within reach at all  times, provide assistance with  transfer out of bed and ambulation.  Medication Inspection Compliance: Pill count conducted under aseptic conditions, in front of the patient. Neither the pills nor the bottle was removed from the patient's sight at any time. Once count was completed pills were immediately returned to the patient in their original bottle.  Medication: Tramadol (Ultram) Pill/Patch Count:  20 of 90 pills remain Pill/Patch Appearance: Markings consistent with prescribed medication Bottle Appearance: Standard pharmacy container. Clearly labeled. Filled Date: 5 / 25 / 2023 Last Medication intake:  TodaySafety precautions to be maintained throughout the outpatient stay will include: orient to surroundings, keep bed in low position, maintain call bell within reach at all times, provide assistance with transfer out of bed and ambulation.     UDS:  Summary  Date Value Ref Range Status  02/10/2021 Note  Final    Comment:    ==================================================================== ToxASSURE Select 13 (MW) ==================================================================== Test                             Result       Flag       Units  Drug Present and Declared for Prescription Verification   Tramadol                       >2717        EXPECTED   ng/mg creat   O-Desmethyltramadol            1173         EXPECTED   ng/mg creat   N-Desmethyltramadol            >2717        EXPECTED   ng/mg creat    Source of tramadol is a prescription medication. O-desmethyltramadol    and N-desmethyltramadol are expected metabolites of tramadol.  ==================================================================== Test                      Result    Flag   Units      Ref Range   Creatinine              184              mg/dL      >=20 ==================================================================== Declared Medications:  The flagging and interpretation on this report are based on  the  following declared medications.  Unexpected results may arise from  inaccuracies in the declared medications.   **Note: The testing scope of this panel includes these medications:   Tramadol (Ultram)   **Note: The testing scope of this panel does not include the  following reported medications:   Acetaminophen (Tylenol)  Allopurinol (Zyloprim)  Amlodipine (Norvasc)  Aspirin  Calcium  Fluticasone (Flonase)  Hydralazine (Apresoline)  Hyoscyamine  Irbesartan (Avapro)  Levothyroxine (Synthroid)  Lysine  Omeprazole (Prilosec)  Oxybutynin (Ditropan)  Rosuvastatin (Crestor)  Sennosides (Senokot)  Valacyclovir (Valtrex) ==================================================================== For clinical consultation, please call [phone]. ====================================================================      ROS  Constitutional: Denies any fever or chills Gastrointestinal: No reported hemesis, hematochezia, vomiting, or acute GI distress Musculoskeletal: Denies any acute onset joint swelling, redness, loss of ROM, or weakness Neurological: No reported episodes of acute onset apraxia, aphasia, dysarthria, agnosia, amnesia, paralysis, loss of coordination, or loss of consciousness  Medication Review  Lysine, Vitamin D, acetaminophen, allopurinol, amLODipine, aspirin, calcium carbonate, fluticasone, hydrALAZINE, irbesartan, levothyroxine, omeprazole, rosuvastatin, senna, traMADol, and valACYclovir  History Review  Allergy:  [name] is allergic to tape. Drug: [name]  reports no history of drug use. Alcohol:  reports no history of alcohol use. Tobacco:  reports that he has never smoked. He has never used smokeless tobacco. Social: [name]  reports that he has never smoked. He has never used smokeless tobacco. He reports that he does not drink alcohol and does not use drugs. Medical:  has a past medical history of Acute postoperative pain (03/21/2018), Anemia,  Arthritis, Barrett esophagus, Cancer ([place]), Chicken pox, Diverticulitis, Dysrhythmia, GERD (gastroesophageal reflux disease), Hyperlipidemia, Hypertension, Hypothyroidism, Melanoma ([place]), Sleep apnea, and Ulcer. Surgical: [name]  has a past surgical history that includes Prostate surgery; cystocopy (2003); Hemorrhoid surgery; Cardiac catheterization; sleep study; Colon surgery (2006-2008-2011); Cataract Surgery (Right, 02/13/14); Esophagogastroduodenoscopy (egd) with propofol (N/A, 08/04/2016); Colonoscopy w/ polypectomy; Colonoscopy with propofol (N/A, 02/15/2018); Colonoscopy with propofol (N/A, 03/24/2020); Esophagogastroduodenoscopy (egd) with propofol (N/A, 03/24/2020); and Cystoscopy/retrograde/ureteroscopy/stone extraction with basket (Right, 10/28/2020). Family: family history includes Breast cancer in his daughter; Heart disease in his father; Kidney disease in his father; Liver disease in his mother.  Laboratory Chemistry Profile   Renal Lab Results  Component Value Date   BUN 18 01/27/2022   CREATININE 1.41 01/27/2022   BCR 10 06/13/2017   GFR 45.72 (L) 01/27/2022   GFRAA >60 01/23/2018   GFRNONAA 38 (L) 10/18/2020    Hepatic Lab Results  Component Value Date   AST 31 01/27/2022   ALT 30 01/27/2022   ALBUMIN 4.1 01/27/2022   ALKPHOS 61 01/27/2022   LIPASE 27 10/18/2020    Electrolytes Lab Results  Component Value Date   NA 136 01/27/2022   K 4.1 01/27/2022   CL 102 01/27/2022   CALCIUM 9.5 01/27/2022   MG 2.0 06/13/2017    Bone Lab Results  Component Value Date   VD25OH 24.73 (L) 10/23/2021   25OHVITD1 20 (L) 06/13/2017   25OHVITD2 <1.0 06/13/2017   25OHVITD3 20 06/13/2017    Inflammation (CRP: Acute Phase) (ESR: Chronic Phase) Lab Results  Component Value Date   CRP 2.8 06/13/2017   ESRSEDRATE 13 06/13/2017         Note: Above Lab results reviewed.  Recent Imaging Review  NM Myocar Multi W/Spect W/Wall Motion / EF   The study is normal. The study is low  risk.   No ST deviation was noted.   LV perfusion is normal. There is no evidence of ischemia. There is no  evidence of infarction.   Left ventricular function is normal. End diastolic cavity size is  normal. End systolic cavity size is normal.   CT attenuation images showed mild aortic and coronary calcifications. Note: Reviewed        Physical Exam  General appearance: Well nourished, well developed, and well hydrated. In no apparent acute distress Mental status: Alert, oriented x 3 (person, place, & time)       Respiratory: No evidence of acute respiratory distress Eyes: PERLA Vitals: BP (!) 144/63   Pulse 66   Temp 98.4 F (36.9 C)   Ht _0  (1.727 m)   Wt 257 lb (116.6 kg)   SpO2 95%   BMI 39.08 kg/m  BMI: Estimated body mass index is 39.08 kg/m as calculated from the following:   Height as of this encounter: _1  (1.727 m).   Weight as of this encounter: 257 lb (116.6 kg). Ideal: Ideal body weight: 68.4 kg (150 lb 12.7 oz) Adjusted ideal body weight: 87.7 kg (193 lb 4.4 oz)  Assessment   Diagnosis Status  1. Chronic pain syndrome   2. Lumbar facet syndrome (Bilateral) (L>R)   3. Lumbar facet arthropathy (Bilateral)   4. Pharmacologic therapy   5. Chronic use of opiate for therapeutic purpose   6. Uncomplicated opioid dependence ([place])   7. Encounter for medication management   8. Encounter for chronic pain management    Controlled Controlled Controlled   Updated Problems: No problems updated.  Plan of Care  Problem-specific:  No problem-specific Assessment & Plan notes found for this encounter.  [name] has a current medication list which includes the following long-term medication(s): allopurinol, amlodipine, calcium 600, fluticasone, hydralazine, irbesartan, levothyroxine, omeprazole, rosuvastatin, and [START ON 02/08/2022] tramadol.  Pharmacotherapy (Medications Ordered): Meds ordered this encounter  Medications   traMADol (ULTRAM) 50 MG  tablet    Sig: Take 1 tablet (50 mg total) by mouth in the morning, at noon, and at bedtime. Each refill must last 30 days.    Dispense:  90 tablet    Refill:  5    DO NOT: delete (not duplicate); no partial-fill (will deny script to complete), no refill request (F/U required). DISPENSE: 1 day early if closed on fill date. WARN: No CNS-depressants within 8 hrs of med.   Orders:  Orders Placed This Encounter  Procedures   ToxASSURE Select 13 (MW), Urine    Volume: 30 ml(s). Minimum 3 ml of urine is needed. Document temperature of fresh sample. Indications: Long term (current) use of opiate analgesic (Q11.941)    Order Specific Question:   Release to patient    Answer:   Immediate   Follow-up plan:   Return in about 6 months (around 08/07/2022) for Eval-day (M,W), (F2F), (MM).     Interventional Therapies  Risk  Complexity Considerations:   POOR Candidate for any more RFA due to movement and body habitus.   Planned  Pending:   Pending further evaluation   Under consideration:   Diagnostic left L5 TFESI  Possible left L5 nerve root ganglion RFA  Diagnostic left L4-5 LESI    Completed:   Diagnostic midline caudal ESI x2 (07/03/2019) Palliative right lumbar facet block x3 (08/07/2020) Palliative left lumbar facet block x2 (10/27/2017) Palliative right lumbar facet RFA x1 (04/18/2018)  Palliative left lumbar facet RFA x1 (03/21/2018)    Therapeutic  Palliative (PRN) options:   Diagnostic midline caudal ESI #3  Palliative right lumbar facet block #4  Palliative left lumbar facet block #3  Palliative right lumbar facet RFA #2  Palliative left lumbar facet RFA #2     Recent Visits No visits were found meeting these conditions. Showing recent visits within past 90 days and meeting all other requirements Today's Visits Date Type Provider Dept  01/27/22 Office Visit Milinda Pointer, MD [facility]  Showing today's visits and meeting all other requirements Future  Appointments No visits were found meeting these conditions. Showing future appointments within next 90 days and meeting all other requirements  I discussed the assessment and treatment plan with the patient. The patient was provided an opportunity to ask questions and all were answered. The patient agreed with the plan and demonstrated an understanding of the instructions.  Patient advised to call back or seek an in-person evaluation if the symptoms or condition worsens.  Duration of encounter: 30 minutes.  Total time on encounter, as per AMA guidelines included both the face-to-face and non-face-to-face time personally spent by the physician and/or other qualified health care professional(s) on the  day of the encounter (includes time in activities that require the physician or other qualified health care professional and does not include time in activities normally performed by clinical staff). Physician's time may include the following activities when performed: preparing to see the patient (eg, review of tests, pre-charting review of records) obtaining and/or reviewing separately obtained history performing a medically appropriate examination and/or evaluation counseling and educating the patient/family/caregiver ordering medications, tests, or procedures referring and communicating with other health care professionals (when not separately reported) documenting clinical information in the electronic or other health record independently interpreting results (not separately reported) and communicating results to the patient/ family/caregiver care coordination (not separately reported)  Note by: Gaspar Cola, MD Date: 01/27/2022; Time: 12:34 PM

## 2022-01-27 ENCOUNTER — Ambulatory Visit: Payer: Medicare Other | Attending: Pain Medicine | Admitting: Pain Medicine

## 2022-01-27 ENCOUNTER — Other Ambulatory Visit (INDEPENDENT_AMBULATORY_CARE_PROVIDER_SITE_OTHER): Payer: Medicare Other

## 2022-01-27 ENCOUNTER — Encounter: Payer: Self-pay | Admitting: Pain Medicine

## 2022-01-27 VITALS — BP 144/63 | HR 66 | Temp 98.4°F | Ht 68.0 in | Wt 257.0 lb

## 2022-01-27 DIAGNOSIS — Z79891 Long term (current) use of opiate analgesic: Secondary | ICD-10-CM | POA: Diagnosis not present

## 2022-01-27 DIAGNOSIS — R739 Hyperglycemia, unspecified: Secondary | ICD-10-CM | POA: Diagnosis not present

## 2022-01-27 DIAGNOSIS — E7800 Pure hypercholesterolemia, unspecified: Secondary | ICD-10-CM | POA: Diagnosis not present

## 2022-01-27 DIAGNOSIS — G8929 Other chronic pain: Secondary | ICD-10-CM | POA: Insufficient documentation

## 2022-01-27 DIAGNOSIS — G894 Chronic pain syndrome: Secondary | ICD-10-CM | POA: Insufficient documentation

## 2022-01-27 DIAGNOSIS — F1120 Opioid dependence, uncomplicated: Secondary | ICD-10-CM | POA: Diagnosis present

## 2022-01-27 DIAGNOSIS — Z79899 Other long term (current) drug therapy: Secondary | ICD-10-CM | POA: Insufficient documentation

## 2022-01-27 DIAGNOSIS — M47816 Spondylosis without myelopathy or radiculopathy, lumbar region: Secondary | ICD-10-CM | POA: Diagnosis not present

## 2022-01-27 DIAGNOSIS — E039 Hypothyroidism, unspecified: Secondary | ICD-10-CM | POA: Diagnosis not present

## 2022-01-27 DIAGNOSIS — I10 Essential (primary) hypertension: Secondary | ICD-10-CM

## 2022-01-27 LAB — LIPID PANEL
Cholesterol: 180 mg/dL (ref 0–200)
HDL: 49.6 mg/dL (ref >39.00)
NonHDL: 130.88
Total CHOL/HDL Ratio: 4
Triglycerides: 275 mg/dL — ABNORMAL HIGH (ref 0.0–149.0)
VLDL: 55 mg/dL — ABNORMAL HIGH (ref 0.0–40.0)

## 2022-01-27 LAB — BASIC METABOLIC PANEL
BUN: 18 mg/dL (ref 6–23)
CO2: 26 mEq/L (ref 19–32)
Calcium: 9.5 mg/dL (ref 8.4–10.5)
Chloride: 102 mEq/L (ref 96–112)
Creatinine, Ser: 1.41 mg/dL (ref 0.40–1.50)
GFR: 45.72 mL/min — ABNORMAL LOW (ref >60.00)
Glucose, Bld: 100 mg/dL — ABNORMAL HIGH (ref 70–99)
Potassium: 4.1 mEq/L (ref 3.5–5.1)
Sodium: 136 mEq/L (ref 135–145)

## 2022-01-27 LAB — HEPATIC FUNCTION PANEL
ALT: 30 U/L (ref 0–53)
AST: 31 U/L (ref 0–37)
Albumin: 4.1 g/dL (ref 3.5–5.2)
Alkaline Phosphatase: 61 U/L (ref 39–117)
Bilirubin, Direct: 0.1 mg/dL (ref 0.0–0.3)
Total Bilirubin: 0.5 mg/dL (ref 0.2–1.2)
Total Protein: 7.3 g/dL (ref 6.0–8.3)

## 2022-01-27 LAB — TSH: TSH: 7.58 u[IU]/mL — ABNORMAL HIGH (ref 0.35–5.50)

## 2022-01-27 LAB — HEMOGLOBIN A1C: Hgb A1c MFr Bld: 5.5 % (ref 4.6–6.5)

## 2022-01-27 LAB — LDL CHOLESTEROL, DIRECT: Direct LDL: 84 mg/dL

## 2022-01-27 MED ORDER — TRAMADOL HCL 50 MG PO TABS: 50.0000 mg | ORAL_TABLET | Freq: Three times a day (TID) | ORAL | 5 refills | Status: DC

## 2022-01-27 NOTE — Patient Instructions (Signed)
____________________________________________________________________________________________  Medication Rules  Purpose: To inform patients, and their family members, of our rules and regulations.  Applies to: All patients receiving prescriptions (written or electronic).  Pharmacy of record: Pharmacy where electronic prescriptions will be sent. If written prescriptions are taken to a different pharmacy, please inform the nursing staff. The pharmacy listed in the electronic medical record should be the one where you would like electronic prescriptions to be sent.  Electronic prescriptions: In compliance with the [place] Strengthen Opioid Misuse Prevention (STOP) Act of 2017 (Session Law 2017-74/H243), effective August 02, 2018, all controlled substances must be electronically prescribed. Calling prescriptions to the pharmacy will cease to exist.  Prescription refills: Only during scheduled appointments. Applies to all prescriptions.  NOTE: The following applies primarily to controlled substances (Opioid* Pain Medications).   Type of encounter (visit): For patients receiving controlled substances, face-to-face visits are required. (Not an option or up to the patient.)  Patient's responsibilities: Pain Pills: Bring all pain pills to every appointment (except for procedure appointments). Pill Bottles: Bring pills in original pharmacy bottle. Always bring the newest bottle. Bring bottle, even if empty. Medication refills: You are responsible for knowing and keeping track of what medications you take and those you need refilled. The day before your appointment: write a list of all prescriptions that need to be refilled. The day of the appointment: give the list to the admitting nurse. Prescriptions will be written only during appointments. No prescriptions will be written on procedure days. If you forget a medication: it will not be "Called in", "Faxed", or "electronically sent". You will  need to get another appointment to get these prescribed. No early refills. Do not call asking to have your prescription filled early. Prescription Accuracy: You are responsible for carefully inspecting your prescriptions before leaving our office. Have the discharge nurse carefully go over each prescription with you, before taking them home. Make sure that your name is accurately spelled, that your address is correct. Check the name and dose of your medication to make sure it is accurate. Check the number of pills, and the written instructions to make sure they are clear and accurate. Make sure that you are given enough medication to last until your next medication refill appointment. Taking Medication: Take medication as prescribed. When it comes to controlled substances, taking less pills or less frequently than prescribed is permitted and encouraged. Never take more pills than instructed. Never take medication more frequently than prescribed.  Inform other Doctors: Always inform, all of your healthcare providers, of all the medications you take. Pain Medication from other Providers: You are not allowed to accept any additional pain medication from any other Doctor or Healthcare provider. There are two exceptions to this rule. (see below) In the event that you require additional pain medication, you are responsible for notifying us, as stated below. Cough Medicine: Often these contain an opioid, such as codeine or hydrocodone. Never accept or take cough medicine containing these opioids if you are already taking an opioid* medication. The combination may cause respiratory failure and death. Medication Agreement: You are responsible for carefully reading and following our Medication Agreement. This must be signed before receiving any prescriptions from our practice. Safely store a copy of your signed Agreement. Violations to the Agreement will result in no further prescriptions. (Additional copies of our  Medication Agreement are available upon request.) Laws, Rules, & Regulations: All patients are expected to follow all Federal and State Laws, Statutes, Rules, & Regulations. Ignorance of   the Laws does not constitute a valid excuse.  Illegal drugs and Controlled Substances: The use of illegal substances (including, but not limited to marijuana and its derivatives) and/or the illegal use of any controlled substances is strictly prohibited. Violation of this rule may result in the immediate and permanent discontinuation of any and all prescriptions being written by our practice. The use of any illegal substances is prohibited. Adopted CDC guidelines & recommendations: Target dosing levels will be at or below 60 MME/day. Use of benzodiazepines** is not recommended.  Exceptions: There are only two exceptions to the rule of not receiving pain medications from other Healthcare Providers. Exception #1 (Emergencies): In the event of an emergency (i.e.: accident requiring emergency care), you are allowed to receive additional pain medication. However, you are responsible for: As soon as you are able, call our office [phone], at any time of the day or night, and leave a message stating your name, the date and nature of the emergency, and the name and dose of the medication prescribed. In the event that your call is answered by a member of our staff, make sure to document and save the date, time, and the name of the person that took your information.  Exception #2 (Planned Surgery): In the event that you are scheduled by another doctor or dentist to have any type of surgery or procedure, you are allowed (for a period no longer than 30 days), to receive additional pain medication, for the acute post-op pain. However, in this case, you are responsible for picking up a copy of our "Post-op Pain Management for Surgeons" handout, and giving it to your surgeon or dentist. This document is available at our office, and  does not require an appointment to obtain it. Simply go to our office during business hours (Monday-Thursday from 8:00 AM to 4:00 PM) (Friday 8:00 AM to 12:00 Noon) or if you have a scheduled appointment with us, prior to your surgery, and ask for it by name. In addition, you are responsible for: calling our office [phone], at any time of the day or night, and leaving a message stating your name, name of your surgeon, type of surgery, and date of procedure or surgery. Failure to comply with your responsibilities may result in termination of therapy involving the controlled substances. Medication Agreement Violation. Following the above rules, including your responsibilities will help you in avoiding a Medication Agreement Violation ("Breaking your Pain Medication Contract").  *Opioid medications include: morphine, codeine, oxycodone, oxymorphone, hydrocodone, hydromorphone, meperidine, tramadol, tapentadol, buprenorphine, fentanyl, methadone. **Benzodiazepine medications include: diazepam (Valium), alprazolam (Xanax), clonazepam (Klonopine), lorazepam (Ativan), clorazepate (Tranxene), chlordiazepoxide (Librium), estazolam (Prosom), oxazepam (Serax), temazepam (Restoril), triazolam (Halcion) (Last updated: 04/29/2021) ____________________________________________________________________________________________  ____________________________________________________________________________________________  Medication Recommendations and Reminders  Applies to: All patients receiving prescriptions (written and/or electronic).  Medication Rules & Regulations: These rules and regulations exist for your safety and that of others. They are not flexible and neither are we. Dismissing or ignoring them will be considered "non-compliance" with medication therapy, resulting in complete and irreversible termination of such therapy. (See document titled "Medication Rules" for more details.) In all conscience,  because of safety reasons, we cannot continue providing a therapy where the patient does not follow instructions.  Pharmacy of record:  Definition: This is the pharmacy where your electronic prescriptions will be sent.  We do not endorse any particular pharmacy, however, we have experienced problems with [facility] not securing enough medication supply for the community. We do not restrict you   in your choice of pharmacy. However, once we write for your prescriptions, we will NOT be re-sending more prescriptions to fix restricted supply problems created by your pharmacy, or your insurance.  The pharmacy listed in the electronic medical record should be the one where you want electronic prescriptions to be sent. If you choose to change pharmacy, simply notify our nursing staff.  Recommendations: Keep all of your pain medications in a safe place, under lock and key, even if you live alone. We will NOT replace lost, stolen, or damaged medication. After you fill your prescription, take 1 week's worth of pills and put them away in a safe place. You should keep a separate, properly labeled bottle for this purpose. The remainder should be kept in the original bottle. Use this as your primary supply, until it runs out. Once it's gone, then you know that you have 1 week's worth of medicine, and it is time to come in for a prescription refill. If you do this correctly, it is unlikely that you will ever run out of medicine. To make sure that the above recommendation works, it is very important that you make sure your medication refill appointments are scheduled at least 1 week before you run out of medicine. To do this in an effective manner, make sure that you do not leave the office without scheduling your next medication management appointment. Always ask the nursing staff to show you in your prescription , when your medication will be running out. Then arrange for the receptionist to get you a return appointment,  at least 7 days before you run out of medicine. Do not wait until you have 1 or 2 pills left, to come in. This is very poor planning and does not take into consideration that we may need to cancel appointments due to bad weather, sickness, or emergencies affecting our staff. DO NOT ACCEPT A "Partial Fill": If for any reason your pharmacy does not have enough pills/tablets to completely fill or refill your prescription, do not allow for a "partial fill". The law allows the pharmacy to complete that prescription within 72 hours, without requiring a new prescription. If they do not fill the rest of your prescription within those 72 hours, you will need a separate prescription to fill the remaining amount, which we will NOT provide. If the reason for the partial fill is your insurance, you will need to talk to the pharmacist about payment alternatives for the remaining tablets, but again, DO NOT ACCEPT A PARTIAL FILL, unless you can trust your pharmacist to obtain the remainder of the pills within 72 hours.  Prescription refills and/or changes in medication(s):  Prescription refills, and/or changes in dose or medication, will be conducted only during scheduled medication management appointments. (Applies to both, written and electronic prescriptions.) No refills on procedure days. No medication will be changed or started on procedure days. No changes, adjustments, and/or refills will be conducted on a procedure day. Doing so will interfere with the diagnostic portion of the procedure. No phone refills. No medications will be "called into the pharmacy". No Fax refills. No weekend refills. No Holliday refills. No after hours refills.  Remember:  Business hours are:  Monday to Thursday 8:00 AM to 4:00 PM Provider's Schedule: Annalucia Laino, MD - Appointments are:  Medication management: Monday and Wednesday 8:00 AM to 4:00 PM Procedure day: Tuesday and Thursday 7:30 AM to 4:00 PM Bilal Lateef, MD -  Appointments are:  Medication management: Tuesday and Thursday 8:00   AM to 4:00 PM Procedure day: Monday and Wednesday 7:30 AM to 4:00 PM (Last update: 02/20/2020) ____________________________________________________________________________________________  ____________________________________________________________________________________________  CBD (cannabidiol) & Delta-8 (Delta-8 tetrahydrocannabinol) WARNING  Intro: Cannabidiol (CBD) and tetrahydrocannabinol (THC), are two natural compounds found in plants of the Cannabis genus. They can both be extracted from hemp or cannabis. Hemp and cannabis come from the Cannabis sativa plant. Both compounds interact with your body's endocannabinoid system, but they have very different effects. CBD does not produce the high sensation associated with cannabis. Delta-8 tetrahydrocannabinol, also known as delta-8 THC, is a psychoactive substance found in the Cannabis sativa plant, of which marijuana and hemp are two varieties. THC is responsible for the high associated with the illicit use of marijuana.  Applicable to: All individuals currently taking or considering taking CBD (cannabidiol) and, more important, all patients taking opioid analgesic controlled substances (pain medication). (Example: oxycodone; oxymorphone; hydrocodone; hydromorphone; morphine; methadone; tramadol; tapentadol; fentanyl; buprenorphine; butorphanol; dextromethorphan; meperidine; codeine; etc.)  Legal status: CBD remains a Schedule I drug prohibited for any use. CBD is illegal with one exception. In the [place], CBD has a limited Food and Drug Administration (FDA) approval for the treatment of two specific types of epilepsy disorders. Only one CBD product has been approved by the FDA for this purpose: "Epidiolex". FDA is aware that some companies are marketing products containing cannabis and cannabis-derived compounds in ways that violate the Federal Food, Drug and Cosmetic Act  (FD&C Act) and that may put the health and safety of consumers at risk. The FDA, a Federal agency, has not enforced the CBD status since 2018. UPDATE: (09/18/2021) The Drug Enforcement Agency (DEA) issued a letter stating that "delta" cannabinoids, including Delta-8-THCO and Delta-9-THCO, synthetically derived from hemp do not qualify as hemp and will be viewed as Schedule I drugs. (Schedule I drugs, substances, or chemicals are defined as drugs with no currently accepted medical use and a high potential for abuse. Some examples of Schedule I drugs are: heroin, lysergic acid diethylamide (LSD), marijuana (cannabis), 3,4-methylenedioxymethamphetamine (ecstasy), methaqualone, and peyote.) (https://www.dea.gov)  Legality: Some manufacturers ship CBD products nationally, which is illegal. Often such products are sold online and are therefore available throughout the country. CBD is openly sold in head shops and health food stores in some states where such sales have not been explicitly legalized. Selling unapproved products with unsubstantiated therapeutic claims is not only a violation of the law, but also can put patients at risk, as these products have not been proven to be safe or effective. Federal illegality makes it difficult to conduct research on CBD.  Reference: "FDA Regulation of Cannabis and Cannabis-Derived Products, Including Cannabidiol (CBD)" - https://www.fda.gov/news-events/public-health-focus/fda-regulation-cannabis-and-cannabis-derived-products-including-cannabidiol-cbd  Warning: CBD is not FDA approved and has not undergo the same manufacturing controls as prescription drugs.  This means that the purity and safety of available CBD may be questionable. Most of the time, despite manufacturer's claims, it is contaminated with THC (delta-9-tetrahydrocannabinol - the chemical in marijuana responsible for the "HIGH").  When this is the case, the THC contaminant will trigger a positive urine drug  screen (UDS) test for Marijuana (carboxy-THC). Because a positive UDS for any illicit substance is a violation of our medication agreement, your opioid analgesics (pain medicine) may be permanently discontinued. The FDA recently put out a warning about 5 things that everyone should be aware of regarding Delta-8 THC: Delta-8 THC products have not been evaluated or approved by the FDA for safe use and may be marketed in ways that put the   public health at risk. The FDA has received adverse event reports involving delta-8 THC-containing products. Delta-8 THC has psychoactive and intoxicating effects. Delta-8 THC manufacturing often involve use of potentially harmful chemicals to create the concentrations of delta-8 THC claimed in the marketplace. The final delta-8 THC product may have potentially harmful by-products (contaminants) due to the chemicals used in the process. Manufacturing of delta-8 THC products may occur in uncontrolled or unsanitary settings, which may lead to the presence of unsafe contaminants or other potentially harmful substances. Delta-8 THC products should be kept out of the reach of children and pets.  MORE ABOUT CBD  General Information: CBD was discovered in 1940 and it is a derivative of the cannabis sativa genus plants (Marijuana and Hemp). It is one of the 113 identified substances found in Marijuana. It accounts for up to 40% of the plant's extract. As of 2018, preliminary clinical studies on CBD included research for the treatment of anxiety, movement disorders, and pain. CBD is available and consumed in multiple forms, including inhalation of smoke or vapor, as an aerosol spray, and by mouth. It may be supplied as an oil containing CBD, capsules, dried cannabis, or as a liquid solution. CBD is thought not to be as psychoactive as THC (delta-9-tetrahydrocannabinol - the chemical in marijuana responsible for the "HIGH"). Studies suggest that CBD may interact with different  biological target receptors in the body, including cannabinoid and other neurotransmitter receptors. As of 2018 the mechanism of action for its biological effects has not been determined.  Side-effects  Adverse reactions: Dry mouth, diarrhea, decreased appetite, fatigue, drowsiness, malaise, weakness, sleep disturbances, and others.  Drug interactions: CBC may interact with other medications such as blood-thinners. Because CBD causes drowsiness on its own, it also increases the drowsiness caused by other medications, including antihistamines (such as Benadryl), benzodiazepines (Xanax, Ativan, Valium), antipsychotics, antidepressants and opioids, as well as alcohol and supplements such as kava, melatonin and St. John's Wort. Be cautious with the following combinations:   Brivaracetam (Briviact) Brivaracetam is changed and broken down by the body. CBD might decrease how quickly the body breaks down brivaracetam. This might increase levels of brivaracetam in the body.  Caffeine Caffeine is changed and broken down by the body. CBD might decrease how quickly the body breaks down caffeine. This might increase levels of caffeine in the body.  Carbamazepine (Tegretol) Carbamazepine is changed and broken down by the body. CBD might decrease how quickly the body breaks down carbamazepine. This might increase levels of carbamazepine in the body and increase its side effects.  Citalopram (Celexa) Citalopram is changed and broken down by the body. CBD might decrease how quickly the body breaks down citalopram. This might increase levels of citalopram in the body and increase its side effects.  Clobazam (Onfi) Clobazam is changed and broken down by the liver. CBD might decrease how quickly the liver breaks down clobazam. This might increase the effects and side effects of clobazam.  Eslicarbazepine (Aptiom) Eslicarbazepine is changed and broken down by the body. CBD might decrease how quickly the body  breaks down eslicarbazepine. This might increase levels of eslicarbazepine in the body by a small amount.  Everolimus (Zostress) Everolimus is changed and broken down by the body. CBD might decrease how quickly the body breaks down everolimus. This might increase levels of everolimus in the body.  Lithium Taking higher doses of CBD might increase levels of lithium. This can increase the risk of lithium toxicity.  Medications changed by the liver (  Cytochrome P450 1A1 (CYP1A1) substrates) Some medications are changed and broken down by the liver. CBD might change how quickly the liver breaks down these medications. This could change the effects and side effects of these medications.  Medications changed by the liver (Cytochrome P450 1A2 (CYP1A2) substrates) Some medications are changed and broken down by the liver. CBD might change how quickly the liver breaks down these medications. This could change the effects and side effects of these medications.  Medications changed by the liver (Cytochrome P450 1B1 (CYP1B1) substrates) Some medications are changed and broken down by the liver. CBD might change how quickly the liver breaks down these medications. This could change the effects and side effects of these medications.  Medications changed by the liver (Cytochrome P450 2A6 (CYP2A6) substrates) Some medications are changed and broken down by the liver. CBD might change how quickly the liver breaks down these medications. This could change the effects and side effects of these medications.  Medications changed by the liver (Cytochrome P450 2B6 (CYP2B6) substrates) Some medications are changed and broken down by the liver. CBD might change how quickly the liver breaks down these medications. This could change the effects and side effects of these medications.  Medications changed by the liver (Cytochrome P450 2C19 (CYP2C19) substrates) Some medications are changed and broken down by the liver.  CBD might change how quickly the liver breaks down these medications. This could change the effects and side effects of these medications.  Medications changed by the liver (Cytochrome P450 2C8 (CYP2C8) substrates) Some medications are changed and broken down by the liver. CBD might change how quickly the liver breaks down these medications. This could change the effects and side effects of these medications.  Medications changed by the liver (Cytochrome P450 2C9 (CYP2C9) substrates) Some medications are changed and broken down by the liver. CBD might change how quickly the liver breaks down these medications. This could change the effects and side effects of these medications.  Medications changed by the liver (Cytochrome P450 2D6 (CYP2D6) substrates) Some medications are changed and broken down by the liver. CBD might change how quickly the liver breaks down these medications. This could change the effects and side effects of these medications.  Medications changed by the liver (Cytochrome P450 2E1 (CYP2E1) substrates) Some medications are changed and broken down by the liver. CBD might change how quickly the liver breaks down these medications. This could change the effects and side effects of these medications.  Medications changed by the liver (Cytochrome P450 3A4 (CYP3A4) substrates) Some medications are changed and broken down by the liver. CBD might change how quickly the liver breaks down these medications. This could change the effects and side effects of these medications.  Medications changed by the liver (Glucuronidated drugs) Some medications are changed and broken down by the liver. CBD might change how quickly the liver breaks down these medications. This could change the effects and side effects of these medications.  Medications that decrease the breakdown of other medications by the liver (Cytochrome P450 2C19 (CYP2C19) inhibitors) CBD is changed and broken down by the liver.  Some drugs decrease how quickly the liver changes and breaks down CBD. This could change the effects and side effects of CBD.  Medications that decrease the breakdown of other medications in the liver (Cytochrome P450 3A4 (CYP3A4) inhibitors) CBD is changed and broken down by the liver. Some drugs decrease how quickly the liver changes and breaks down CBD. This could change the effects   and side effects of CBD.  Medications that increase breakdown of other medications by the liver (Cytochrome P450 3A4 (CYP3A4) inducers) CBD is changed and broken down by the liver. Some drugs increase how quickly the liver changes and breaks down CBD. This could change the effects and side effects of CBD.  Medications that increase the breakdown of other medications by the liver (Cytochrome P450 2C19 (CYP2C19) inducers) CBD is changed and broken down by the liver. Some drugs increase how quickly the liver changes and breaks down CBD. This could change the effects and side effects of CBD.  Methadone (Dolophine) Methadone is broken down by the liver. CBD might decrease how quickly the liver breaks down methadone. Taking cannabidiol along with methadone might increase the effects and side effects of methadone.  Rufinamide (Banzel) Rufinamide is changed and broken down by the body. CBD might decrease how quickly the body breaks down rufinamide. This might increase levels of rufinamide in the body by a small amount.  Sedative medications (CNS depressants) CBD might cause sleepiness and slowed breathing. Some medications, called sedatives, can also cause sleepiness and slowed breathing. Taking CBD with sedative medications might cause breathing problems and/or too much sleepiness.  Sirolimus (Rapamune) Sirolimus is changed and broken down by the body. CBD might decrease how quickly the body breaks down sirolimus. This might increase levels of sirolimus in the body.  Stiripentol (Diacomit) Stiripentol is changed and  broken down by the body. CBD might decrease how quickly the body breaks down stiripentol. This might increase levels of stiripentol in the body and increase its side effects.  Tacrolimus (Prograf) Tacrolimus is changed and broken down by the body. CBD might decrease how quickly the body breaks down tacrolimus. This might increase levels of tacrolimus in the body.  Tamoxifen (Soltamox) Tamoxifen is changed and broken down by the body. CBD might affect how quickly the body breaks down tamoxifen. This might affect levels of tamoxifen in the body.  Topiramate (Topamax) Topiramate is changed and broken down by the body. CBD might decrease how quickly the body breaks down topiramate. This might increase levels of topiramate in the body by a small amount.  Valproate Valproic acid can cause liver injury. Taking cannabidiol with valproic acid might increase the chance of liver injury. CBD and/or valproic acid might need to be stopped, or the dose might need to be reduced.  Warfarin (Coumadin) CBD might increase levels of warfarin, which can increase the risk for bleeding. CBD and/or warfarin might need to be stopped, or the dose might need to be reduced.  Zonisamide Zonisamide is changed and broken down by the body. CBD might decrease how quickly the body breaks down zonisamide. This might increase levels of zonisamide in the body by a small amount. (Last update: 09/30/2021) ____________________________________________________________________________________________  ____________________________________________________________________________________________  Drug Holidays (Slow)  What is a "Drug Holiday"? Drug Holiday: is the name given to the period of time during which a patient stops taking a medication(s) for the purpose of eliminating tolerance to the drug.  Benefits Improved effectiveness of opioids. Decreased opioid dose needed to achieve benefits. Improved pain with lesser  dose.  What is tolerance? Tolerance: is the progressive decreased in effectiveness of a drug due to its repetitive use. With repetitive use, the body gets use to the medication and as a consequence, it loses its effectiveness. This is a common problem seen with opioid pain medications. As a result, a larger dose of the drug is needed to achieve the same effect that   used to be obtained with a smaller dose.  How long should a "Drug Holiday" last? You should stay off of the pain medicine for at least 14 consecutive days. (2 weeks)  Should I stop the medicine "cold turkey"? No. You should always coordinate with your Pain Specialist so that he/she can provide you with the correct medication dose to make the transition as smoothly as possible.  How do I stop the medicine? Slowly. You will be instructed to decrease the daily amount of pills that you take by one (1) pill every seven (7) days. This is called a "slow downward taper" of your dose. For example: if you normally take four (4) pills per day, you will be asked to drop this dose to three (3) pills per day for seven (7) days, then to two (2) pills per day for seven (7) days, then to one (1) per day for seven (7) days, and at the end of those last seven (7) days, this is when the "Drug Holiday" would start.   Will I have withdrawals? By doing a "slow downward taper" like this one, it is unlikely that you will experience any significant withdrawal symptoms. Typically, what triggers withdrawals is the sudden stop of a high dose opioid therapy. Withdrawals can usually be avoided by slowly decreasing the dose over a prolonged period of time. If you do not follow these instructions and decide to stop your medication abruptly, withdrawals may be possible.  What are withdrawals? Withdrawals: refers to the wide range of symptoms that occur after stopping or dramatically reducing opiate drugs after heavy and prolonged use. Withdrawal symptoms do not occur to  patients that use low dose opioids, or those who take the medication sporadically. Contrary to benzodiazepine (example: Valium, Xanax, etc.) or alcohol withdrawals ("Delirium Tremens"), opioid withdrawals are not lethal. Withdrawals are the physical manifestation of the body getting rid of the excess receptors.  Expected Symptoms Early symptoms of withdrawal may include: Agitation Anxiety Muscle aches Increased tearing Insomnia Runny nose Sweating Yawning  Late symptoms of withdrawal may include: Abdominal cramping Diarrhea Dilated pupils Goose bumps Nausea Vomiting  Will I experience withdrawals? Due to the slow nature of the taper, it is very unlikely that you will experience any.  What is a slow taper? Taper: refers to the gradual decrease in dose.  (Last update: 02/20/2020) ____________________________________________________________________________________________   ____________________________________________________________________________________________  Pharmacy Shortages of Pain Medication   Introduction Shockingly as it may seem, .  "No U.S. Supreme Court decision has ever interpreted the Constitution as guaranteeing a right to health care for all Americans." - https://www.healthequityandpolicylab.com/elusive-right-to-health-care-under-us-law  "With respect to human rights, the [place] has no formally codified right to health, nor does it participate in a human rights treaty that specifies a right to health." - Scott J. Schweikart, JD, MBE  Situation By now, most of our patients have had the experience of being told by their pharmacist that they do not have enough medication to cover their prescription. If you have not had this experience, just know that you soon will.  Problem There appears to be a shortage of these medications, either at the national level or locally. This is happening with all pharmacies. When there is not enough medication, patients  are offered a partial fill and they are told that they will try to get the rest of the medicine for them at a later time. If they do not have enough for even a partial fill, the pharmacists are telling the patients to   call us (the prescribing physicians) to request that we send another prescription to another pharmacy to get the medicine.   This reordering of a controlled substance creates documentation problems where additional paperwork needs to be created to explain why two prescriptions for the same period of time and the same medicine are being prescribed to the same patient. It also creates situations where the last appointment note does not accurately reflect when and what prescriptions were given to a patient. This leads to prescribing errors down the line, in subsequent follow-up visits.   [place] Board of Pharmacy (NCBOP) Research revealed that Board of Pharmacy Rule .1806 (21 NCAC 46.1806) authorizes pharmacists to the transfer of prescriptions among pharmacies, and it sets forth procedural and recordkeeping requirements for doing so. However, this requires the pharmacist to complete the previously mentioned procedural paperwork to accomplish the transfer. As it turns out, it is much easier for them to have the prescribing physicians do the work.   Possible solutions 1. Have the [place] Assembly add a provision to the "STOP ACT" (the law that mandates how controlled substances are prescribed) where there is an exception to the electronic prescribing rule that states that in the event there are shortages of medications the physicians are allowed to use written prescriptions as opposed to electronic ones. This would allow patients to take their prescriptions to a different pharmacy that may have enough medication available to fill the prescription. The problem is that currently there is a law that does not allow for written prescriptions, with the exception of instances where the  electronic medical record is down due to technical issues.  2. Have [place] Congress ease the pressure on pharmaceutical companies, allowing them to produce enough quantities of the medication to adequately supply the population. 3. Have pharmacies keep enough stocks of these medications to cover their client base.  4. Have the [place] Assembly add a provision to the "STOP ACT" where they ease the regulations surrounding the transfer of controlled substances between pharmacies, so as to simplify the transfer of supplies. As an alternative, develop a system to allow patients to obtain the remainder of their prescription at another one of their pharmacies or at an associate pharmacy.   How this shortage will affect you.  The one thing that is abundantly clear is that this is a pharmacy supply problem  and not a prescriber problem. The job of the prescriber is to evaluate and monitor the patients for the appropriate indications to the use of these medicines, the monitoring of their use and the prescribing of the appropriate dose and regimen. It is not the job of the prescriber to provide or dispense the actual medication. By law, this is the job of the pharmacies and pharmacists. It is certainly not the job of the prescriber to solve the supply problems.   Due to the above problems we are no longer taking patients to write for their pain medication. We will continue to evaluate for appropriate indications and we may provide recommendations regarding medication, dose, and schedule, as well as monitoring recommendations, however, we will not be taking over the actual prescribing of these substances. On those patients where we are treating their chronic pain with interventional therapies, exceptions will be considered on a case by case basis. At this time, we will try to continue providing this supplemental service to those patients we have been managing in the past. However, as of August 1st, 2023, we no  longer   will be sending additional prescriptions to other pharmacies for the purpose of solving their supply problems. Once we send a prescription to a pharmacy, we will not be resending it again to another pharmacy to cover for their shortages.   What to do. Write as many letters as you can. Recruit the help of family members in writing these letters. Below are some of the places where you can write to make your voice heard. Let them know what the problem is and push them to look for solutions.   Search internet for: "[place] find your legislators" https://www.ncleg.gov/findyourlegislators  Search internet for: "[place] insurance commissioner complaints" https://www.ncdoi.gov/contactscomplaints/assistance-or-file-complaint  Search internet for: "[place] Board of Pharmacy complaints" http://www.ncbop.org/contact.htm  Search internet for: "[facility] pharmacy complaints" Email [facility] Customer Relations https://www.[facility]vs.com/help/email-customer-relations.jsp?callType=store  Search internet for: "[facility] pharmacy customer service complaints" https://[facility].com/topic/marketing/contactus/contactus_customerservice.jsp  ____________________________________________________________________________________________   

## 2022-01-27 NOTE — Progress Notes (Signed)
Nursing Pain Medication Assessment:  Safety precautions to be maintained throughout the outpatient stay will include: orient to surroundings, keep bed in low position, maintain call bell within reach at all times, provide assistance with transfer out of bed and ambulation.  Medication Inspection Compliance: Pill count conducted under aseptic conditions, in front of the patient. Neither the pills nor the bottle was removed from the patient's sight at any time. Once count was completed pills were immediately returned to the patient in their original bottle.  Medication: Tramadol (Ultram) Pill/Patch Count:  20 of 90 pills remain Pill/Patch Appearance: Markings consistent with prescribed medication Bottle Appearance: Standard pharmacy container. Clearly labeled. Filled Date: 5 / 25 / 2023 Last Medication intake:  TodaySafety precautions to be maintained throughout the outpatient stay will include: orient to surroundings, keep bed in low position, maintain call bell within reach at all times, provide assistance with transfer out of bed and ambulation.

## 2022-01-28 ENCOUNTER — Other Ambulatory Visit: Payer: Self-pay

## 2022-01-28 ENCOUNTER — Telehealth: Payer: Self-pay

## 2022-01-28 DIAGNOSIS — E039 Hypothyroidism, unspecified: Secondary | ICD-10-CM

## 2022-01-28 MED ORDER — LEVOTHYROXINE SODIUM 125 MCG PO TABS: 125.0000 ug | ORAL_TABLET | Freq: Every day | ORAL | 3 refills | Status: DC

## 2022-01-28 NOTE — Telephone Encounter (Signed)
LMTCB for lab results.  

## 2022-02-01 ENCOUNTER — Ambulatory Visit (INDEPENDENT_AMBULATORY_CARE_PROVIDER_SITE_OTHER): Payer: Medicare Other

## 2022-02-01 ENCOUNTER — Encounter: Payer: Self-pay | Admitting: Internal Medicine

## 2022-02-01 ENCOUNTER — Ambulatory Visit (INDEPENDENT_AMBULATORY_CARE_PROVIDER_SITE_OTHER): Payer: Medicare Other | Admitting: Internal Medicine

## 2022-02-01 DIAGNOSIS — R0602 Shortness of breath: Secondary | ICD-10-CM | POA: Diagnosis not present

## 2022-02-01 DIAGNOSIS — E7800 Pure hypercholesterolemia, unspecified: Secondary | ICD-10-CM

## 2022-02-01 DIAGNOSIS — R739 Hyperglycemia, unspecified: Secondary | ICD-10-CM | POA: Diagnosis not present

## 2022-02-01 DIAGNOSIS — I10 Essential (primary) hypertension: Secondary | ICD-10-CM | POA: Diagnosis not present

## 2022-02-01 DIAGNOSIS — E039 Hypothyroidism, unspecified: Secondary | ICD-10-CM

## 2022-02-01 DIAGNOSIS — G4730 Sleep apnea, unspecified: Secondary | ICD-10-CM

## 2022-02-01 LAB — TOXASSURE SELECT 13 (MW), URINE

## 2022-02-01 MED ORDER — HYDRALAZINE HCL 50 MG PO TABS: 50.0000 mg | ORAL_TABLET | Freq: Two times a day (BID) | ORAL | 3 refills | Status: DC

## 2022-02-01 NOTE — Patient Instructions (Signed)
Decrease hydralazine to '50mg'$  twice a day

## 2022-02-01 NOTE — Progress Notes (Unsigned)
[name], male   DOB: 1935/04/23, 87 y.o.   MRN: [id number]   Subjective:    [name], male    DOB: [birth year], 87 y.o.   MRN: [id number]  This visit occurred during the SARS-CoV-2 public health emergency.  Safety protocols were in place, including screening questions prior to the visit, additional usage of staff PPE, and extensive cleaning of exam room while observing appropriate contact time as indicated for disinfecting solutions.   Patient here for  Chief Complaint  Patient presents with   Hypertension   .   HPI    Past Medical History:  Diagnosis Date   Acute postoperative pain 03/21/2018   Anemia    Arthritis    Barrett esophagus    Cancer ([place])    prostate,skin   Chicken pox    Diverticulitis    Dysrhythmia    GERD (gastroesophageal reflux disease)    Hyperlipidemia    Hypertension    Hypothyroidism    Melanoma ([place])    Malignant resection   Sleep apnea    Ulcer    Past Surgical History:  Procedure Laterality Date   CARDIAC CATHETERIZATION     Cataract Surgery Right 02/13/14   COLON SURGERY  2006-2008-2011   polyps removed   COLONOSCOPY W/ POLYPECTOMY     COLONOSCOPY WITH PROPOFOL N/A 02/15/2018   Procedure: COLONOSCOPY WITH PROPOFOL;  Surgeon: Manya Silvas, MD;  Location: [facility] ENDOSCOPY;  Service: Endoscopy;  Laterality: N/A;   COLONOSCOPY WITH PROPOFOL N/A 03/24/2020   Procedure: COLONOSCOPY WITH PROPOFOL;  Surgeon: Lesly Rubenstein, MD;  Location: [facility] ENDOSCOPY;  Service: Endoscopy;  Laterality: N/A;   cystocopy  2003   CYSTOSCOPY/RETROGRADE/URETEROSCOPY/STONE EXTRACTION WITH BASKET Right 10/28/2020   Procedure: CYSTOSCOPY/RETROGRADE PYELOGRAM/STONE EXTRACTION;  Surgeon: Abbie Sons, MD;  Location: [facility] ORS;  Service: Urology;  Laterality: Right;   ESOPHAGOGASTRODUODENOSCOPY (EGD) WITH PROPOFOL N/A 08/04/2016   Procedure: ESOPHAGOGASTRODUODENOSCOPY (EGD) WITH PROPOFOL;  Surgeon: Manya Silvas, MD;  Location: [facility]  ENDOSCOPY;  Service: Endoscopy;  Laterality: N/A;   ESOPHAGOGASTRODUODENOSCOPY (EGD) WITH PROPOFOL N/A 03/24/2020   Procedure: ESOPHAGOGASTRODUODENOSCOPY (EGD) WITH PROPOFOL;  Surgeon: Lesly Rubenstein, MD;  Location: [facility] ENDOSCOPY;  Service: Endoscopy;  Laterality: N/A;   HEMORRHOID SURGERY     PROSTATE SURGERY     sleep study     Family History  Problem Relation Age of Onset   Liver disease Mother    Heart disease Father    Kidney disease Father    Breast cancer Daughter    Social History   Socioeconomic History   Marital status: Widowed    Spouse name: Not on file   Number of children: Not on file   Years of education: Not on file   Highest education level: Not on file  Occupational History   Not on file  Tobacco Use   Smoking status: Never   Smokeless tobacco: Never  Vaping Use   Vaping Use: Never used  Substance and Sexual Activity   Alcohol use: No    Alcohol/week: 0.0 standard drinks of alcohol   Drug use: No   Sexual activity: Never  Other Topics Concern   Not on file  Social History Narrative   Not on file   Social Determinants of Health   Financial Resource Strain: Low Risk  (06/01/2021)   Overall Financial Resource Strain (CARDIA)    Difficulty of Paying Living Expenses: Not hard at all  Food Insecurity: No Food Insecurity (06/01/2021)  Hunger Vital Sign    Worried About Running Out of Food in the Last Year: Never true    Ran Out of Food in the Last Year: Never true  Transportation Needs: No Transportation Needs (06/01/2021)   PRAPARE - Hydrologist (Medical): No    Lack of Transportation (Non-Medical): No  Physical Activity: Unknown (06/01/2021)   Exercise Vital Sign    Days of Exercise per Week: 0 days    Minutes of Exercise per Session: Not on file  Stress: No Stress Concern Present (06/01/2021)   [place]    Feeling of Stress : Not at all   Social Connections: Moderately Isolated (06/01/2021)   Social Connection and Isolation Panel [NHANES]    Frequency of Communication with Friends and Family: More than three times a week    Frequency of Social Gatherings with Friends and Family: More than three times a week    Attends Religious Services: More than 4 times per year    Active Member of [employer] or Organizations: No    Attends Archivist Meetings: Not on file    Marital Status: Widowed     Review of Systems     Objective:     BP 136/72 (BP Location: Left Arm, Patient Position: Sitting, Cuff Size: Large)   Pulse 60   Temp 98.4 F (36.9 C) (Temporal)   Resp 16   Ht '5\' 8"'$  (1.727 m)   Wt 253 lb (114.8 kg)   SpO2 96%   BMI 38.47 kg/m  Wt Readings from Last 3 Encounters:  02/01/22 253 lb (114.8 kg)  01/27/22 257 lb (116.6 kg)  01/22/22 257 lb (116.6 kg)    Physical Exam   Outpatient Encounter Medications as of 02/01/2022  Medication Sig   acetaminophen (TYLENOL) 500 MG tablet Take 500 mg by mouth every 6 (six) hours as needed.   allopurinol (ZYLOPRIM) 100 MG tablet TAKE 1 TABLET BY MOUTH DAILY   amLODipine (NORVASC) 10 MG tablet TAKE 1 TABLET BY MOUTH DAILY   aspirin 81 MG tablet Take 81 mg by mouth daily.   CALCIUM 600 1500 (600 Ca) MG TABS tablet TAKE 2 TABLETS BY MOUTH EVERY DAY WITH BREAKFAST   fluticasone (FLONASE) 50 MCG/ACT nasal spray TWO PUFFS IN EACH NOSTRIL ONCE A DAY   irbesartan (AVAPRO) 300 MG tablet TAKE 1 TABLET BY MOUTH DAILY   levothyroxine (SYNTHROID) 125 MCG tablet Take 1 tablet (125 mcg total) by mouth daily.   LYSINE PO Take 1 tablet by mouth daily.   omeprazole (PRILOSEC) 20 MG capsule TAKE ONE CAPSULE BY MOUTH TWICE A DAY   rosuvastatin (CRESTOR) 5 MG tablet TAKE 1 TABLET BY MOUTH DAILY   senna (SENOKOT) 8.6 MG TABS tablet Take 2 tablets by mouth daily.   [START ON 02/08/2022] traMADol (ULTRAM) 50 MG tablet Take 1 tablet (50 mg total) by mouth in the morning, at noon, and at  bedtime. Each refill must last 30 days.   valACYclovir (VALTREX) 1000 MG tablet TAKE 1 TABLET BY MOUTH DAILY   VITAMIN D PO Take 1 tablet by mouth daily.   [DISCONTINUED] hydrALAZINE (APRESOLINE) 50 MG tablet TAKE ONE TABLET BY MOUTH THREE TIMES A DAY.   hydrALAZINE (APRESOLINE) 50 MG tablet Take 1 tablet (50 mg total) by mouth 2 (two) times daily.   No facility-administered encounter medications on file as of 02/01/2022.     Lab Results  Component Value Date  WBC 8.9 10/23/2021   HGB 14.8 10/23/2021   HCT 44.2 10/23/2021   PLT 260.0 10/23/2021   GLUCOSE 100 (H) 01/27/2022   CHOL 180 01/27/2022   TRIG 275.0 (H) 01/27/2022   HDL 49.60 01/27/2022   LDLDIRECT 84.0 01/27/2022   LDLCALC 54 02/05/2021   ALT 30 01/27/2022   AST 31 01/27/2022   NA 136 01/27/2022   K 4.1 01/27/2022   CL 102 01/27/2022   CREATININE 1.41 01/27/2022   BUN 18 01/27/2022   CO2 26 01/27/2022   TSH 7.58 (H) 01/27/2022   PSA 0.00 (L) 06/16/2020   HGBA1C 5.5 01/27/2022    NM Myocar Multi W/Spect W/Wall Motion / EF  Result Date: 01/11/2022   The study is normal. The study is low risk.   No ST deviation was noted.   LV perfusion is normal. There is no evidence of ischemia. There is no evidence of infarction.   Left ventricular function is normal. End diastolic cavity size is normal. End systolic cavity size is normal.   CT attenuation images showed mild aortic and coronary calcifications.       Assessment & Plan:   Problem List Items Addressed This Visit     SOB (shortness of breath)   Relevant Orders   DG Chest 2 View     Einar Pheasant, MD

## 2022-02-02 ENCOUNTER — Encounter: Payer: Self-pay | Admitting: Internal Medicine

## 2022-02-02 NOTE — Assessment & Plan Note (Signed)
Has known sleep apnea.  Unable to tolerate cpap.  Discussed.  Desires no further intervention at this time.  Follow

## 2022-02-02 NOTE — Assessment & Plan Note (Signed)
On thyroid replacement.  Follow tsh.  

## 2022-02-02 NOTE — Assessment & Plan Note (Signed)
Low cholesterol diet and exercise.  Crestor.  Follow lipid panel and liver function tests.   

## 2022-02-02 NOTE — Assessment & Plan Note (Signed)
Just had cardiac w/up as outlined.  lexi scan and echo as outlined.  Felt to be related to deconditioning.  Adjust blood pressure medication as outlined.  Check cxr.  Declines treatment for sleep apnea.  Follow.

## 2022-02-02 NOTE — Assessment & Plan Note (Signed)
Low carb diet and exercise.  Follow met b and a1c.  

## 2022-02-02 NOTE — Assessment & Plan Note (Signed)
Taking hydralazine, avapro and amlodipine.  Taking hydralazine '50mg'$  in am and '100mg'$  q hs. Feels contributing to increased fatigue.  Will decrease hydralazine to '50mg'$  bid.  Blood pressure on my check ok.  Follow pressures. Follow metabolic panel.  Call with update over the next several days.

## 2022-02-03 ENCOUNTER — Encounter: Payer: Self-pay | Admitting: *Deleted

## 2022-02-03 ENCOUNTER — Encounter: Payer: Medicare Other | Admitting: Pain Medicine

## 2022-02-25 ENCOUNTER — Encounter: Payer: Self-pay | Admitting: Internal Medicine

## 2022-03-02 ENCOUNTER — Encounter: Payer: Self-pay | Admitting: Internal Medicine

## 2022-03-02 ENCOUNTER — Ambulatory Visit (INDEPENDENT_AMBULATORY_CARE_PROVIDER_SITE_OTHER): Payer: Medicare Other | Admitting: Internal Medicine

## 2022-03-02 VITALS — BP 140/72 | HR 69 | Temp 97.5°F | Ht 68.0 in | Wt 248.1 lb

## 2022-03-02 DIAGNOSIS — E039 Hypothyroidism, unspecified: Secondary | ICD-10-CM | POA: Diagnosis not present

## 2022-03-02 DIAGNOSIS — R600 Localized edema: Secondary | ICD-10-CM | POA: Diagnosis not present

## 2022-03-02 DIAGNOSIS — R739 Hyperglycemia, unspecified: Secondary | ICD-10-CM

## 2022-03-02 DIAGNOSIS — I10 Essential (primary) hypertension: Secondary | ICD-10-CM | POA: Diagnosis not present

## 2022-03-02 DIAGNOSIS — E7800 Pure hypercholesterolemia, unspecified: Secondary | ICD-10-CM | POA: Diagnosis not present

## 2022-03-02 LAB — BASIC METABOLIC PANEL
BUN: 20 mg/dL (ref 6–23)
CO2: 24 mEq/L (ref 19–32)
Calcium: 9.5 mg/dL (ref 8.4–10.5)
Chloride: 103 mEq/L (ref 96–112)
Creatinine, Ser: 1.54 mg/dL — ABNORMAL HIGH (ref 0.40–1.50)
GFR: 41.1 mL/min — ABNORMAL LOW (ref >60.00)
Glucose, Bld: 99 mg/dL (ref 70–99)
Potassium: 4.3 mEq/L (ref 3.5–5.1)
Sodium: 138 mEq/L (ref 135–145)

## 2022-03-02 LAB — HEPATIC FUNCTION PANEL
ALT: 34 U/L (ref 0–53)
AST: 37 U/L (ref 0–37)
Albumin: 4.1 g/dL (ref 3.5–5.2)
Alkaline Phosphatase: 72 U/L (ref 39–117)
Bilirubin, Direct: 0.1 mg/dL (ref 0.0–0.3)
Total Bilirubin: 0.4 mg/dL (ref 0.2–1.2)
Total Protein: 7 g/dL (ref 6.0–8.3)

## 2022-03-02 LAB — CBC WITH DIFFERENTIAL/PLATELET
Basophils Absolute: 0 10*3/uL (ref 0.0–0.1)
Basophils Relative: 0.5 % (ref 0.0–3.0)
Eosinophils Absolute: 0.1 10*3/uL (ref 0.0–0.7)
Eosinophils Relative: 1.1 % (ref 0.0–5.0)
HCT: 43.7 % (ref 39.0–52.0)
Hemoglobin: 14.3 g/dL (ref 13.0–17.0)
Lymphocytes Relative: 30 % (ref 12.0–46.0)
Lymphs Abs: 2.2 10*3/uL (ref 0.7–4.0)
MCHC: 32.8 g/dL (ref 30.0–36.0)
MCV: 91.5 fl (ref 78.0–100.0)
Monocytes Absolute: 0.9 10*3/uL (ref 0.1–1.0)
Monocytes Relative: 12.3 % — ABNORMAL HIGH (ref 3.0–12.0)
Neutro Abs: 4.1 10*3/uL (ref 1.4–7.7)
Neutrophils Relative %: 56.1 % (ref 43.0–77.0)
Platelets: 234 10*3/uL (ref 150.0–400.0)
RBC: 4.78 Mil/uL (ref 4.22–5.81)
RDW: 14 % (ref 11.5–15.5)
WBC: 7.3 10*3/uL (ref 4.0–10.5)

## 2022-03-02 LAB — TSH: TSH: 3.45 u[IU]/mL (ref 0.35–5.50)

## 2022-03-02 NOTE — Progress Notes (Signed)
[name], male   DOB: 07/03/1935, 87 y.o.   MRN: [id number]   Subjective:    [name], male    DOB: 05/04/1935, 87 y.o.   MRN: [id number]   Patient here for work in appt.   Chief Complaint  Patient presents with   Follow-up    Foot swelling   .   HPI Last visit, adjusted hydralazine dose due to fatigue.  Felt better - regarding increased fatigue.  Has noticed over the last week increased swelling - feet.  Comes in today accompanied by his daughter.  History obtained from both of them.  On discussion, he reports eating an increased amount of barbeque - not watching salt intake.  Has started elevating legs.  Swelling is better.  No chest pain.  Breathing stable.  No increased cough or congestion.  Medications is unchanged.     Past Medical History:  Diagnosis Date   Acute postoperative pain 03/21/2018   Anemia    Arthritis    Barrett esophagus    Cancer ([place])    prostate,skin   Chicken pox    Diverticulitis    Dysrhythmia    GERD (gastroesophageal reflux disease)    Hyperlipidemia    Hypertension    Hypothyroidism    Melanoma ([place])    Malignant resection   Sleep apnea    Ulcer    Past Surgical History:  Procedure Laterality Date   CARDIAC CATHETERIZATION     Cataract Surgery Right 02/13/14   COLON SURGERY  2006-2008-2011   polyps removed   COLONOSCOPY W/ POLYPECTOMY     COLONOSCOPY WITH PROPOFOL N/A 02/15/2018   Procedure: COLONOSCOPY WITH PROPOFOL;  Surgeon: Manya Silvas, MD;  Location: [facility] ENDOSCOPY;  Service: Endoscopy;  Laterality: N/A;   COLONOSCOPY WITH PROPOFOL N/A 03/24/2020   Procedure: COLONOSCOPY WITH PROPOFOL;  Surgeon: Lesly Rubenstein, MD;  Location: [facility] ENDOSCOPY;  Service: Endoscopy;  Laterality: N/A;   cystocopy  2003   CYSTOSCOPY/RETROGRADE/URETEROSCOPY/STONE EXTRACTION WITH BASKET Right 10/28/2020   Procedure: CYSTOSCOPY/RETROGRADE PYELOGRAM/STONE EXTRACTION;  Surgeon: Abbie Sons, MD;  Location: [facility] ORS;   Service: Urology;  Laterality: Right;   ESOPHAGOGASTRODUODENOSCOPY (EGD) WITH PROPOFOL N/A 08/04/2016   Procedure: ESOPHAGOGASTRODUODENOSCOPY (EGD) WITH PROPOFOL;  Surgeon: Manya Silvas, MD;  Location: [facility] ENDOSCOPY;  Service: Endoscopy;  Laterality: N/A;   ESOPHAGOGASTRODUODENOSCOPY (EGD) WITH PROPOFOL N/A 03/24/2020   Procedure: ESOPHAGOGASTRODUODENOSCOPY (EGD) WITH PROPOFOL;  Surgeon: Lesly Rubenstein, MD;  Location: [facility] ENDOSCOPY;  Service: Endoscopy;  Laterality: N/A;   HEMORRHOID SURGERY     PROSTATE SURGERY     sleep study     Family History  Problem Relation Age of Onset   Liver disease Mother    Heart disease Father    Kidney disease Father    Breast cancer Daughter    Social History   Socioeconomic History   Marital status: Widowed    Spouse name: Not on file   Number of children: Not on file   Years of education: Not on file   Highest education level: Not on file  Occupational History   Not on file  Tobacco Use   Smoking status: Never   Smokeless tobacco: Never  Vaping Use   Vaping Use: Never used  Substance and Sexual Activity   Alcohol use: No    Alcohol/week: 0.0 standard drinks of alcohol   Drug use: No   Sexual activity: Never  Other Topics Concern   Not on file  Social  History Narrative   Not on file   Social Determinants of Health   Financial Resource Strain: Low Risk  (06/01/2021)   Overall Financial Resource Strain (CARDIA)    Difficulty of Paying Living Expenses: Not hard at all  Food Insecurity: No Food Insecurity (06/01/2021)   Hunger Vital Sign    Worried About Running Out of Food in the Last Year: Never true    Ran Out of Food in the Last Year: Never true  Transportation Needs: No Transportation Needs (06/01/2021)   PRAPARE - Hydrologist (Medical): No    Lack of Transportation (Non-Medical): No  Physical Activity: Unknown (06/01/2021)   Exercise Vital Sign    Days of Exercise per Week: 0 days     Minutes of Exercise per Session: Not on file  Stress: No Stress Concern Present (06/01/2021)   [place]    Feeling of Stress : Not at all  Social Connections: Moderately Isolated (06/01/2021)   Social Connection and Isolation Panel [NHANES]    Frequency of Communication with Friends and Family: More than three times a week    Frequency of Social Gatherings with Friends and Family: More than three times a week    Attends Religious Services: More than 4 times per year    Active Member of [employer] or Organizations: No    Attends Archivist Meetings: Not on file    Marital Status: Widowed     Review of Systems  Constitutional:  Negative for appetite change and unexpected weight change.  HENT:  Negative for congestion and sinus pressure.   Respiratory:  Negative for cough and chest tightness.        Breathing stable.   Cardiovascular:  Positive for leg swelling. Negative for chest pain and palpitations.  Gastrointestinal:  Negative for abdominal pain, diarrhea, nausea and vomiting.  Genitourinary:  Negative for difficulty urinating and dysuria.  Musculoskeletal:  Negative for joint swelling and myalgias.  Skin:  Negative for color change and rash.  Neurological:  Negative for dizziness and headaches.  Psychiatric/Behavioral:  Negative for agitation and dysphoric mood.        Objective:     BP (!) 140/72 (BP Location: Left Arm, Patient Position: Sitting, Cuff Size: Normal)   Pulse 69   Temp (!) 97.5 F (36.4 C) (Oral)   Ht 5' 8" (1.727 m)   Wt 248 lb 1.9 oz (112.5 kg)   SpO2 96%   BMI 37.73 kg/m  Wt Readings from Last 3 Encounters:  03/02/22 248 lb 1.9 oz (112.5 kg)  02/01/22 253 lb (114.8 kg)  01/27/22 257 lb (116.6 kg)    Physical Exam Constitutional:      General: He is not in acute distress.    Appearance: Normal appearance. He is well-developed.  HENT:     Head: Normocephalic and atraumatic.      Right Ear: External ear normal.     Left Ear: External ear normal.  Eyes:     General: No scleral icterus.       Right eye: No discharge.        Left eye: No discharge.  Cardiovascular:     Rate and Rhythm: Normal rate and regular rhythm.  Pulmonary:     Effort: Pulmonary effort is normal. No respiratory distress.     Breath sounds: Normal breath sounds.  Abdominal:     General: Bowel sounds are normal.     Palpations:  Abdomen is soft.     Tenderness: There is no abdominal tenderness.  Musculoskeletal:        General: No tenderness.     Cervical back: Neck supple. No tenderness.     Comments: Pedal and lower extremity swelling - improved.  No increased erythema.    Lymphadenopathy:     Cervical: No cervical adenopathy.  Skin:    Findings: No erythema or rash.  Neurological:     Mental Status: He is alert.  Psychiatric:        Mood and Affect: Mood normal.        Behavior: Behavior normal.      Outpatient Encounter Medications as of 03/02/2022  Medication Sig   acetaminophen (TYLENOL) 500 MG tablet Take 500 mg by mouth every 6 (six) hours as needed.   allopurinol (ZYLOPRIM) 100 MG tablet TAKE 1 TABLET BY MOUTH DAILY   amLODipine (NORVASC) 10 MG tablet TAKE 1 TABLET BY MOUTH DAILY   aspirin 81 MG tablet Take 81 mg by mouth daily.   CALCIUM 600 1500 (600 Ca) MG TABS tablet TAKE 2 TABLETS BY MOUTH EVERY DAY WITH BREAKFAST   fluticasone (FLONASE) 50 MCG/ACT nasal spray TWO PUFFS IN EACH NOSTRIL ONCE A DAY   hydrALAZINE (APRESOLINE) 50 MG tablet Take 1 tablet (50 mg total) by mouth 2 (two) times daily.   irbesartan (AVAPRO) 300 MG tablet TAKE 1 TABLET BY MOUTH DAILY   levothyroxine (SYNTHROID) 125 MCG tablet Take 1 tablet (125 mcg total) by mouth daily.   LYSINE PO Take 1 tablet by mouth daily.   omeprazole (PRILOSEC) 20 MG capsule TAKE ONE CAPSULE BY MOUTH TWICE A DAY   rosuvastatin (CRESTOR) 5 MG tablet TAKE 1 TABLET BY MOUTH DAILY   senna (SENOKOT) 8.6 MG TABS tablet  Take 2 tablets by mouth daily.   traMADol (ULTRAM) 50 MG tablet Take 1 tablet (50 mg total) by mouth in the morning, at noon, and at bedtime. Each refill must last 30 days.   valACYclovir (VALTREX) 1000 MG tablet TAKE 1 TABLET BY MOUTH DAILY   VITAMIN D PO Take 1 tablet by mouth daily.   No facility-administered encounter medications on file as of 03/02/2022.     Lab Results  Component Value Date   WBC 7.3 03/02/2022   HGB 14.3 03/02/2022   HCT 43.7 03/02/2022   PLT 234.0 03/02/2022   GLUCOSE 99 03/02/2022   CHOL 180 01/27/2022   TRIG 275.0 (H) 01/27/2022   HDL 49.60 01/27/2022   LDLDIRECT 84.0 01/27/2022   LDLCALC 54 02/05/2021   ALT 34 03/02/2022   AST 37 03/02/2022   NA 138 03/02/2022   K 4.3 03/02/2022   CL 103 03/02/2022   CREATININE 1.54 (H) 03/02/2022   BUN 20 03/02/2022   CO2 24 03/02/2022   TSH 3.45 03/02/2022   PSA 0.00 (L) 06/16/2020   HGBA1C 5.5 01/27/2022    NM Myocar Multi W/Spect W/Wall Motion / EF  Result Date: 01/11/2022   The study is normal. The study is low risk.   No ST deviation was noted.   LV perfusion is normal. There is no evidence of ischemia. There is no evidence of infarction.   Left ventricular function is normal. End diastolic cavity size is normal. End systolic cavity size is normal.   CT attenuation images showed mild aortic and coronary calcifications.       Assessment & Plan:   Problem List Items Addressed This Visit     Hypercholesterolemia  Low cholesterol diet and exercise. Crestor.  Follow lipid panel and liver function tests.       Relevant Orders   Hepatic function panel (Completed)   Hyperglycemia    Low carb diet and exercise.  Follow met b and a1c.       Hypertension - Primary    Taking hydralazine, avapro and amlodipine.  Taking hydralazine to 56m bid.  Follow pressures. Follow metabolic panel.       Hypothyroid    On thyroid replacement.  Follow tsh.       Relevant Orders   TSH (Completed)   Lower extremity  edema    Swelling has started to improve.  Discussed the need for follow weight.  Weight is down.  No increased sob.  Discussed the need to start monitoring salt intake and decrease sodium intake.  Continue leg elevation.  Follow.       Relevant Orders   CBC with Differential/Platelet (Completed)   Basic metabolic panel (Completed)     CEinar Pheasant MD

## 2022-03-03 ENCOUNTER — Other Ambulatory Visit: Payer: Self-pay | Admitting: Internal Medicine

## 2022-03-03 DIAGNOSIS — N1831 Chronic kidney disease, stage 3a: Secondary | ICD-10-CM

## 2022-03-03 NOTE — Progress Notes (Signed)
Orders placed for f/u labs.  

## 2022-03-07 ENCOUNTER — Encounter: Payer: Self-pay | Admitting: Internal Medicine

## 2022-03-07 NOTE — Assessment & Plan Note (Signed)
On thyroid replacement.  Follow tsh.  

## 2022-03-07 NOTE — Assessment & Plan Note (Signed)
Low cholesterol diet and exercise.  Crestor.  Follow lipid panel and liver function tests.   

## 2022-03-07 NOTE — Assessment & Plan Note (Signed)
Low carb diet and exercise.  Follow met b and a1c.

## 2022-03-07 NOTE — Assessment & Plan Note (Signed)
Taking hydralazine, avapro and amlodipine.  Taking hydralazine to 50mg bid.  Follow pressures. Follow metabolic panel.  

## 2022-03-07 NOTE — Assessment & Plan Note (Signed)
Swelling has started to improve.  Discussed the need for follow weight.  Weight is down.  No increased sob.  Discussed the need to start monitoring salt intake and decrease sodium intake.  Continue leg elevation.  Follow.

## 2022-03-11 ENCOUNTER — Other Ambulatory Visit: Payer: Medicare Other

## 2022-03-12 ENCOUNTER — Other Ambulatory Visit: Payer: Self-pay

## 2022-03-12 ENCOUNTER — Other Ambulatory Visit: Payer: Self-pay | Admitting: Internal Medicine

## 2022-03-12 DIAGNOSIS — N1831 Chronic kidney disease, stage 3a: Secondary | ICD-10-CM

## 2022-03-15 ENCOUNTER — Institutional Professional Consult (permissible substitution): Payer: Medicare Other | Admitting: Primary Care

## 2022-03-15 ENCOUNTER — Other Ambulatory Visit (INDEPENDENT_AMBULATORY_CARE_PROVIDER_SITE_OTHER): Payer: Medicare Other

## 2022-03-15 DIAGNOSIS — N1831 Chronic kidney disease, stage 3a: Secondary | ICD-10-CM | POA: Diagnosis not present

## 2022-03-16 LAB — URINALYSIS, ROUTINE W REFLEX MICROSCOPIC
Bilirubin Urine: NEGATIVE
Hgb urine dipstick: NEGATIVE
Ketones, ur: NEGATIVE
Leukocytes,Ua: NEGATIVE
Nitrite: NEGATIVE
Specific Gravity, Urine: 1.015 (ref 1.000–1.030)
Total Protein, Urine: 100 — AB
Urine Glucose: NEGATIVE
Urobilinogen, UA: 0.2 (ref 0.0–1.0)
pH: 6 (ref 5.0–8.0)

## 2022-03-16 LAB — BASIC METABOLIC PANEL
BUN: 22 mg/dL (ref 6–23)
CO2: 25 mEq/L (ref 19–32)
Calcium: 9.4 mg/dL (ref 8.4–10.5)
Chloride: 102 mEq/L (ref 96–112)
Creatinine, Ser: 1.56 mg/dL — ABNORMAL HIGH (ref 0.40–1.50)
GFR: 40.46 mL/min — ABNORMAL LOW (ref >60.00)
Glucose, Bld: 106 mg/dL — ABNORMAL HIGH (ref 70–99)
Potassium: 4.4 mEq/L (ref 3.5–5.1)
Sodium: 137 mEq/L (ref 135–145)

## 2022-03-17 ENCOUNTER — Telehealth: Payer: Self-pay

## 2022-03-17 ENCOUNTER — Encounter: Payer: Self-pay | Admitting: Internal Medicine

## 2022-03-17 NOTE — Telephone Encounter (Signed)
Pt has seen results via mychart on 03/17/22 Lvm for pt to return call to see if agreeable to kidney US & referral to nephrology.

## 2022-03-31 ENCOUNTER — Other Ambulatory Visit: Payer: Self-pay | Admitting: Family

## 2022-04-06 ENCOUNTER — Ambulatory Visit (INDEPENDENT_AMBULATORY_CARE_PROVIDER_SITE_OTHER): Payer: Medicare Other | Admitting: Internal Medicine

## 2022-04-06 ENCOUNTER — Encounter: Payer: Self-pay | Admitting: Internal Medicine

## 2022-04-06 VITALS — BP 122/70 | HR 71 | Temp 97.9°F | Ht 68.0 in | Wt 260.2 lb

## 2022-04-06 DIAGNOSIS — Z86010 Personal history of colonic polyps: Secondary | ICD-10-CM | POA: Diagnosis not present

## 2022-04-06 DIAGNOSIS — R600 Localized edema: Secondary | ICD-10-CM

## 2022-04-06 DIAGNOSIS — K2270 Barrett's esophagus without dysplasia: Secondary | ICD-10-CM | POA: Diagnosis not present

## 2022-04-06 DIAGNOSIS — I10 Essential (primary) hypertension: Secondary | ICD-10-CM | POA: Diagnosis not present

## 2022-04-06 DIAGNOSIS — R0602 Shortness of breath: Secondary | ICD-10-CM

## 2022-04-06 DIAGNOSIS — N1831 Chronic kidney disease, stage 3a: Secondary | ICD-10-CM

## 2022-04-06 DIAGNOSIS — E7800 Pure hypercholesterolemia, unspecified: Secondary | ICD-10-CM | POA: Diagnosis not present

## 2022-04-06 DIAGNOSIS — R739 Hyperglycemia, unspecified: Secondary | ICD-10-CM | POA: Diagnosis not present

## 2022-04-06 DIAGNOSIS — N183 Chronic kidney disease, stage 3 unspecified: Secondary | ICD-10-CM | POA: Insufficient documentation

## 2022-04-06 DIAGNOSIS — E559 Vitamin D deficiency, unspecified: Secondary | ICD-10-CM

## 2022-04-06 DIAGNOSIS — E039 Hypothyroidism, unspecified: Secondary | ICD-10-CM

## 2022-04-06 DIAGNOSIS — Z8546 Personal history of malignant neoplasm of prostate: Secondary | ICD-10-CM

## 2022-04-06 NOTE — Progress Notes (Signed)
Patient ID: MARRELL DICAPRIO, male   DOB: Aug 09, 1934, 87 y.o.   MRN: [id number]   Subjective:    Patient ID: BISHOP VANDERWERF, male    DOB: April 25, 1935, 87 y.o.   MRN: [id number]   Patient here for  Chief Complaint  Patient presents with   Follow-up    2 mo/ vitamin d heck/ kidney function check   .   HPI Here to follow up regarding his blood pressure and kidney function.  He is accompanied by his daughter. History obtained from both of them.  Reports he has been doing relatively well.  No chest pain.  Breathing stable.  Eating.  No nausea or vomiting.  Bowels moving.  Blood pressure elevated - reviewed outside readings - 140-150s/60s.  His pulse readings varying upper 40s- 50s.     Past Medical History:  Diagnosis Date   Acute postoperative pain 03/21/2018   Anemia    Arthritis    Barrett esophagus    Cancer ([place])    prostate,skin   Chicken pox    Diverticulitis    Dysrhythmia    GERD (gastroesophageal reflux disease)    Hyperlipidemia    Hypertension    Hypothyroidism    Melanoma ([place])    Malignant resection   Sleep apnea    Ulcer    Past Surgical History:  Procedure Laterality Date   CARDIAC CATHETERIZATION     Cataract Surgery Right 02/13/14   COLON SURGERY  2006-2008-2011   polyps removed   COLONOSCOPY W/ POLYPECTOMY     COLONOSCOPY WITH PROPOFOL N/A 02/15/2018   Procedure: COLONOSCOPY WITH PROPOFOL;  Surgeon: Manya Silvas, MD;  Location: [facility] ENDOSCOPY;  Service: Endoscopy;  Laterality: N/A;   COLONOSCOPY WITH PROPOFOL N/A 03/24/2020   Procedure: COLONOSCOPY WITH PROPOFOL;  Surgeon: Lesly Rubenstein, MD;  Location: [facility] ENDOSCOPY;  Service: Endoscopy;  Laterality: N/A;   cystocopy  2003   CYSTOSCOPY/RETROGRADE/URETEROSCOPY/STONE EXTRACTION WITH BASKET Right 10/28/2020   Procedure: CYSTOSCOPY/RETROGRADE PYELOGRAM/STONE EXTRACTION;  Surgeon: Abbie Sons, MD;  Location: [facility] ORS;  Service: Urology;  Laterality: Right;   ESOPHAGOGASTRODUODENOSCOPY (EGD) WITH PROPOFOL  N/A 08/04/2016   Procedure: ESOPHAGOGASTRODUODENOSCOPY (EGD) WITH PROPOFOL;  Surgeon: Manya Silvas, MD;  Location: [facility] ENDOSCOPY;  Service: Endoscopy;  Laterality: N/A;   ESOPHAGOGASTRODUODENOSCOPY (EGD) WITH PROPOFOL N/A 03/24/2020   Procedure: ESOPHAGOGASTRODUODENOSCOPY (EGD) WITH PROPOFOL;  Surgeon: Lesly Rubenstein, MD;  Location: [facility] ENDOSCOPY;  Service: Endoscopy;  Laterality: N/A;   HEMORRHOID SURGERY     PROSTATE SURGERY     sleep study     Family History  Problem Relation Age of Onset   Liver disease Mother    Heart disease Father    Kidney disease Father    Breast cancer Daughter    Social History   Socioeconomic History   Marital status: Widowed    Spouse name: Not on file   Number of children: Not on file   Years of education: Not on file   Highest education level: Not on file  Occupational History   Not on file  Tobacco Use   Smoking status: Never   Smokeless tobacco: Never  Vaping Use   Vaping Use: Never used  Substance and Sexual Activity   Alcohol use: No    Alcohol/week: 0.0 standard drinks of alcohol   Drug use: No   Sexual activity: Never  Other Topics Concern   Not on file  Social History Narrative   Not on file   Social Determinants of Health  Financial Resource Strain: Low Risk  (06/01/2021)   Overall Financial Resource Strain (CARDIA)    Difficulty of Paying Living Expenses: Not hard at all  Food Insecurity: No Food Insecurity (06/01/2021)   Hunger Vital Sign    Worried About Running Out of Food in the Last Year: Never true    Ran Out of Food in the Last Year: Never true  Transportation Needs: No Transportation Needs (06/01/2021)   PRAPARE - Hydrologist (Medical): No    Lack of Transportation (Non-Medical): No  Physical Activity: Unknown (06/01/2021)   Exercise Vital Sign    Days of Exercise per Week: 0 days    Minutes of Exercise per Session: Not on file  Stress: No Stress Concern Present (06/01/2021)    [place]    Feeling of Stress : Not at all  Social Connections: Moderately Isolated (06/01/2021)   Social Connection and Isolation Panel [NHANES]    Frequency of Communication with Friends and Family: More than three times a week    Frequency of Social Gatherings with Friends and Family: More than three times a week    Attends Religious Services: More than 4 times per year    Active Member of [employer] or Organizations: No    Attends Archivist Meetings: Not on file    Marital Status: Widowed     Review of Systems  Constitutional:  Negative for appetite change and unexpected weight change.  HENT:  Negative for congestion and sinus pressure.   Respiratory:  Negative for cough and chest tightness.        Breathing stable.   Cardiovascular:  Negative for chest pain and palpitations.       No increased swelling.   Gastrointestinal:  Negative for abdominal pain, diarrhea, nausea and vomiting.  Genitourinary:  Negative for difficulty urinating and dysuria.  Musculoskeletal:  Negative for joint swelling and myalgias.  Skin:  Negative for color change and rash.  Neurological:  Negative for dizziness and headaches.  Psychiatric/Behavioral:  Negative for agitation and dysphoric mood.        Objective:     BP 122/70 (BP Location: Right Arm, Patient Position: Sitting, Cuff Size: Large)   Pulse 71   Temp 97.9 F (36.6 C) (Oral)   Ht '5\' 8"'  (1.727 m)   Wt 260 lb 3.2 oz (118 kg)   SpO2 97%   BMI 39.56 kg/m  Wt Readings from Last 3 Encounters:  04/06/22 260 lb 3.2 oz (118 kg)  03/02/22 248 lb 1.9 oz (112.5 kg)  02/01/22 253 lb (114.8 kg)    Physical Exam Constitutional:      General: He is not in acute distress.    Appearance: Normal appearance. He is well-developed.  HENT:     Head: Normocephalic and atraumatic.     Right Ear: External ear normal.     Left Ear: External ear normal.     Mouth/Throat:      Pharynx: Oropharynx is clear. No oropharyngeal exudate or posterior oropharyngeal erythema.  Eyes:     General: No scleral icterus.       Right eye: No discharge.        Left eye: No discharge.  Cardiovascular:     Rate and Rhythm: Normal rate and regular rhythm.  Pulmonary:     Effort: Pulmonary effort is normal. No respiratory distress.     Breath sounds: Normal breath sounds.  Abdominal:  General: Bowel sounds are normal.     Palpations: Abdomen is soft.     Tenderness: There is no abdominal tenderness.  Musculoskeletal:        General: No tenderness.     Cervical back: Neck supple. No tenderness.     Comments: No increased lower extremity swelling.    Lymphadenopathy:     Cervical: No cervical adenopathy.  Skin:    Findings: No erythema or rash.  Neurological:     Mental Status: He is alert.  Psychiatric:        Mood and Affect: Mood normal.        Behavior: Behavior normal.      Outpatient Encounter Medications as of 04/06/2022  Medication Sig   acetaminophen (TYLENOL) 500 MG tablet Take 500 mg by mouth every 6 (six) hours as needed.   allopurinol (ZYLOPRIM) 100 MG tablet TAKE 1 TABLET BY MOUTH DAILY   amLODipine (NORVASC) 10 MG tablet TAKE 1 TABLET BY MOUTH DAILY   aspirin 81 MG tablet Take 81 mg by mouth daily.   calcium carbonate (OSCAL) 1500 (600 Ca) MG TABS tablet TAKE 2 TABLETS BY MOUTH DAILY WITH BREAKFAST   fluticasone (FLONASE) 50 MCG/ACT nasal spray TWO PUFFS IN EACH NOSTRIL ONCE A DAY   hydrALAZINE (APRESOLINE) 50 MG tablet TAKE 1 TABLET BY MOUTH 3 TIMES DAILY   irbesartan (AVAPRO) 300 MG tablet TAKE 1 TABLET BY MOUTH DAILY   levothyroxine (SYNTHROID) 125 MCG tablet Take 1 tablet (125 mcg total) by mouth daily.   LYSINE PO Take 1 tablet by mouth daily.   omeprazole (PRILOSEC) 20 MG capsule TAKE ONE CAPSULE BY MOUTH TWICE A DAY   rosuvastatin (CRESTOR) 5 MG tablet TAKE 1 TABLET BY MOUTH DAILY   senna (SENOKOT) 8.6 MG TABS tablet Take 2 tablets by mouth  daily.   traMADol (ULTRAM) 50 MG tablet Take 1 tablet (50 mg total) by mouth in the morning, at noon, and at bedtime. Each refill must last 30 days.   valACYclovir (VALTREX) 1000 MG tablet TAKE 1 TABLET BY MOUTH DAILY   VITAMIN D PO Take 1 tablet by mouth daily.   No facility-administered encounter medications on file as of 04/06/2022.     Lab Results  Component Value Date   WBC 7.3 03/02/2022   HGB 14.3 03/02/2022   HCT 43.7 03/02/2022   PLT 234.0 03/02/2022   GLUCOSE 92 04/06/2022   CHOL 180 01/27/2022   TRIG 275.0 (H) 01/27/2022   HDL 49.60 01/27/2022   LDLDIRECT 84.0 01/27/2022   LDLCALC 54 02/05/2021   ALT 34 03/02/2022   AST 37 03/02/2022   NA 140 04/06/2022   K 3.8 04/06/2022   CL 105 04/06/2022   CREATININE 1.59 (H) 04/06/2022   BUN 19 04/06/2022   CO2 24 04/06/2022   TSH 3.45 03/02/2022   PSA 0.00 (L) 06/16/2020   HGBA1C 5.5 01/27/2022    NM Myocar Multi W/Spect W/Wall Motion / EF  Result Date: 01/11/2022   The study is normal. The study is low risk.   No ST deviation was noted.   LV perfusion is normal. There is no evidence of ischemia. There is no evidence of infarction.   Left ventricular function is normal. End diastolic cavity size is normal. End systolic cavity size is normal.   CT attenuation images showed mild aortic and coronary calcifications.       Assessment & Plan:   Problem List Items Addressed This Visit     Barrett's esophagus  No upper symptoms.  On prilosec.  Stable. Has been followed by GI.       CKD (chronic kidney disease) stage 3, GFR 30-59 ml/min (HCC)    Continues on avapro.  Avoid antiinflammatories.  Follow metabolic panel. Discussed renal ultrasound and referral to nephrology.  Will notify me if agreeable.       History of colonic polyps    Colonoscopy 03/2020 - multiple polyps.  Recommended f/u in one year.  Needs f/u.       History of prostate cancer    S/p prostatectomy.  Followed by Dr Bernardo Heater.         Hypercholesterolemia    Low cholesterol diet and exercise. Crestor.  Follow lipid panel and liver function tests.       Hyperglycemia    Low carb diet and exercise.  Follow met b and a1c.       Hypertension - Primary    Taking hydralazine, avapro and amlodipine.  Taking hydralazine to 56m bid.  Follow pressures. Follow metabolic panel.       Relevant Orders   VITAMIN D 25 Hydroxy (Vit-D Deficiency, Fractures) (Completed)   Basic metabolic panel (Completed)   Urinalysis, Routine w reflex microscopic (Completed)   Hypothyroid    On thyroid replacement.  Follow tsh.       Lower extremity edema    Overall stable.  Leg elevation.  Compression hose.      SOB (shortness of breath)    Just had cardiac w/up as outlined.  lexi scan and echo as outlined.  Felt to be related to deconditioning.  CXR - no active cardiopulmonary disease.  Declines treatment for sleep apnea.  Follow. Overall feels breathing is stable.       Vitamin D deficiency    Recheck vitamin d level today.         CEinar Pheasant MD

## 2022-04-07 LAB — URINALYSIS, ROUTINE W REFLEX MICROSCOPIC
Bilirubin Urine: NEGATIVE
Hgb urine dipstick: NEGATIVE
Ketones, ur: NEGATIVE
Leukocytes,Ua: NEGATIVE
Nitrite: NEGATIVE
RBC / HPF: NONE SEEN (ref 0–?)
Specific Gravity, Urine: >=1.03 — AB (ref 1.000–1.030)
Total Protein, Urine: 100 — AB
Urine Glucose: NEGATIVE
Urobilinogen, UA: 0.2 (ref 0.0–1.0)
pH: 5.5 (ref 5.0–8.0)

## 2022-04-07 LAB — VITAMIN D 25 HYDROXY (VIT D DEFICIENCY, FRACTURES): VITD: 33.78 ng/mL (ref 30.00–100.00)

## 2022-04-07 LAB — BASIC METABOLIC PANEL
BUN: 19 mg/dL (ref 6–23)
CO2: 24 mEq/L (ref 19–32)
Calcium: 9.5 mg/dL (ref 8.4–10.5)
Chloride: 105 mEq/L (ref 96–112)
Creatinine, Ser: 1.59 mg/dL — ABNORMAL HIGH (ref 0.40–1.50)
GFR: 39.53 mL/min — ABNORMAL LOW (ref >60.00)
Glucose, Bld: 92 mg/dL (ref 70–99)
Potassium: 3.8 mEq/L (ref 3.5–5.1)
Sodium: 140 mEq/L (ref 135–145)

## 2022-04-09 ENCOUNTER — Telehealth: Payer: Self-pay

## 2022-04-09 NOTE — Telephone Encounter (Signed)
Lvm for pt's daughter to return call in regards to Vit D lvl & f/u on referral to nephro.   Per Dr.Scott: Reviewed note.  Vitamin D level is wnl.  Please f/u to see if she has had a chance to speak with her father about nephrology referral and ultrasound.

## 2022-04-11 ENCOUNTER — Encounter: Payer: Self-pay | Admitting: Internal Medicine

## 2022-04-11 NOTE — Assessment & Plan Note (Signed)
Continues on avapro.  Avoid antiinflammatories.  Follow metabolic panel. Discussed renal ultrasound and referral to nephrology.  Will notify me if agreeable.

## 2022-04-11 NOTE — Assessment & Plan Note (Signed)
Just had cardiac w/up as outlined.  lexi scan and echo as outlined.  Felt to be related to deconditioning.  CXR - no active cardiopulmonary disease.  Declines treatment for sleep apnea.  Follow. Overall feels breathing is stable.

## 2022-04-11 NOTE — Assessment & Plan Note (Signed)
On thyroid replacement.  Follow tsh.  

## 2022-04-11 NOTE — Assessment & Plan Note (Signed)
Overall stable.  Leg elevation.  Compression hose.

## 2022-04-11 NOTE — Assessment & Plan Note (Signed)
Recheck vitamin d level today.

## 2022-04-11 NOTE — Assessment & Plan Note (Signed)
Low carb diet and exercise.  Follow met b and a1c.  

## 2022-04-11 NOTE — Assessment & Plan Note (Signed)
S/p prostatectomy.  Followed by Dr Stoioff.   

## 2022-04-11 NOTE — Assessment & Plan Note (Signed)
Taking hydralazine, avapro and amlodipine.  Taking hydralazine to 50mg bid.  Follow pressures. Follow metabolic panel.  

## 2022-04-11 NOTE — Assessment & Plan Note (Signed)
Low cholesterol diet and exercise.  Crestor.  Follow lipid panel and liver function tests.   

## 2022-04-11 NOTE — Assessment & Plan Note (Signed)
Colonoscopy 03/2020 - multiple polyps.  Recommended f/u in one year.  Needs f/u.  

## 2022-04-11 NOTE — Assessment & Plan Note (Signed)
No upper symptoms.  On prilosec.  Stable. Has been followed by GI.  

## 2022-04-13 NOTE — Progress Notes (Unsigned)
PROVIDER NOTE: Information contained herein reflects review and annotations entered in association with encounter. Interpretation of such information and data should be left to medically-trained personnel. Information provided to patient can be located elsewhere in the medical record under "Patient Instructions". Document created using STT-dictation technology, any transcriptional errors that may result from process are unintentional.    Patient: Hector [name]  Service Category: E/M  Provider: Gaspar Cola, MD  DOB: Jun 05, 1935  DOS: 04/14/2022  Referring Provider: Einar Pheasant, MD  MRN: [id number]  Specialty: Interventional Pain Management  PCP: Einar Pheasant, MD  Type: Established Patient  Setting: Ambulatory outpatient    Location: Office  Delivery: Face-to-face     HPI  [name], a 87 y.o. year old male, is here today because of his No primary diagnosis found.. Hector [name]'s primary complain today is No chief complaint on file. Last encounter: My last encounter with him was on 01/27/2022. Pertinent problems: Hector [name] has History of prostate cancer; Shoulder pain, left; Chronic hip pain (Left); Chronic low back pain (1ry area of Pain) (Bilateral) (L>R) w/ sciatica (Left); Chronic pain syndrome; Polyarthritis, unspecified (Secondary Area of Pain); L5-S1 bilateral pars defect with spondylolisthesis; Lumbosacral Grade 1 Anterolisthesis of L5 over S1; Lumbar facet arthropathy (Bilateral); Lumbar foraminal stenosis (L5-S1) (Bilateral) (L>R); Chronic Lumbar radiculitis (L5) (Left); Chronic musculoskeletal pain; Lumbar facet syndrome (Bilateral) (L>R); Chronic lower extremity pain (2ry area of Pain) (Left); Chronic lower extremity radicular pain (L5) (Left); Lumbar facet osteoarthritis (Bilateral); Osteoarthritis of lumbar spine; Lumbar spondylosis; Spondylosis without myelopathy or radiculopathy, lumbosacral region; Chronic right shoulder pain; DDD (degenerative disc disease), lumbosacral; Gout;  Chronic low back pain (Bilateral) w/o sciatica; and Neurogenic pain on their pertinent problem list. Pain Assessment: Severity of   is reported as a  /10. Location:    / . Onset:  . Quality:  . Timing:  . Modifying factor(s):  Marland Kitchen Vitals:  vitals were not taken for this visit.   Reason for encounter:  *** . ***  Pharmacotherapy Assessment  Analgesic: Tramadol 50 mg, 1 tab PO q 8 hrs (150 mg/day of tramadol) MME/day: 15 mg/day.   Monitoring: [place] PMP: PDMP reviewed during this encounter.       Pharmacotherapy: No side-effects or adverse reactions reported. Compliance: No problems identified. Effectiveness: Clinically acceptable.  No notes on file  No results found for: "CBDTHCR" No results found for: "D8THCCBX" No results found for: "D9THCCBX"  UDS:  Summary  Date Value Ref Range Status  01/27/2022 Note  Final    Comment:    ==================================================================== [employer] [facility] (MW) ==================================================================== Test                             Result       Flag       Units  Drug Present and Declared for Prescription Verification   Tramadol                       >1767        EXPECTED   ng/mg creat   O-Desmethyltramadol            745          EXPECTED   ng/mg creat   N-Desmethyltramadol            >1767        EXPECTED   ng/mg creat    Source of tramadol is a prescription medication.  O-desmethyltramadol    and N-desmethyltramadol are expected metabolites of tramadol.  ==================================================================== Test                      Result    Flag   Units      Ref Range   Creatinine              283              mg/dL      >=20 ==================================================================== Declared Medications:  The flagging and interpretation on this report are based on the  following declared medications.  Unexpected results may arise from  inaccuracies in the declared  medications.   **Note: The testing scope of this panel includes these medications:   Tramadol (Ultram)   **Note: The testing scope of this panel does not include the  following reported medications:   Acetaminophen (Tylenol)  Allopurinol (Zyloprim)  Amlodipine (Norvasc)  Aspirin  Calcium  Fluticasone (Flonase)  Hydralazine (Apresoline)  Irbesartan (Avapro)  Levothyroxine (Synthroid)  Lysine  Omeprazole (Prilosec)  Rosuvastatin (Crestor)  Sennosides (Senokot)  Valacyclovir (Valtrex)  Vitamin D ==================================================================== For clinical consultation, please call [phone]. ====================================================================       ROS  Constitutional: Denies any fever or chills Gastrointestinal: No reported hemesis, hematochezia, vomiting, or acute GI distress Musculoskeletal: Denies any acute onset joint swelling, redness, loss of ROM, or weakness Neurological: No reported episodes of acute onset apraxia, aphasia, dysarthria, agnosia, amnesia, paralysis, loss of coordination, or loss of consciousness  Medication Review  Lysine, Vitamin D, acetaminophen, allopurinol, amLODipine, aspirin, calcium carbonate, fluticasone, hydrALAZINE, irbesartan, levothyroxine, omeprazole, rosuvastatin, senna, traMADol, and valACYclovir  History Review  Allergy: Hector [name] is allergic to tape. Drug: Hector [name]  reports no history of drug use. Alcohol:  reports no history of alcohol use. Tobacco:  reports that he has never smoked. He has never used smokeless tobacco. Social: Hector [name]  reports that he has never smoked. He has never used smokeless tobacco. He reports that he does not drink alcohol and does not use drugs. Medical:  has a past medical history of Acute postoperative pain (03/21/2018), Anemia, Arthritis, Barrett esophagus, Cancer ([place]), Chicken pox, Diverticulitis, Dysrhythmia, GERD (gastroesophageal reflux disease),  Hyperlipidemia, Hypertension, Hypothyroidism, Melanoma ([place]), Sleep apnea, and Ulcer. Surgical: Hector [name]  has a past surgical history that includes Prostate surgery; cystocopy (2003); Hemorrhoid surgery; Cardiac catheterization; sleep study; Colon surgery (2006-2008-2011); Cataract Surgery (Right, 02/13/14); Esophagogastroduodenoscopy (egd) with propofol (N/A, 08/04/2016); Colonoscopy w/ polypectomy; Colonoscopy with propofol (N/A, 02/15/2018); Colonoscopy with propofol (N/A, 03/24/2020); Esophagogastroduodenoscopy (egd) with propofol (N/A, 03/24/2020); and Cystoscopy/retrograde/ureteroscopy/stone extraction with basket (Right, 10/28/2020). Family: family history includes Breast cancer in his daughter; Heart disease in his father; Kidney disease in his father; Liver disease in his mother.  Laboratory Chemistry Profile   Renal Lab Results  Component Value Date   BUN 19 04/06/2022   CREATININE 1.59 (H) 04/06/2022   BCR 10 06/13/2017   GFR 39.53 (L) 04/06/2022   GFRAA >60 01/23/2018   GFRNONAA 38 (L) 10/18/2020    Hepatic Lab Results  Component Value Date   AST 37 03/02/2022   ALT 34 03/02/2022   ALBUMIN 4.1 03/02/2022   ALKPHOS 72 03/02/2022   LIPASE 27 10/18/2020    Electrolytes Lab Results  Component Value Date   NA 140 04/06/2022   K 3.8 04/06/2022   CL 105 04/06/2022   CALCIUM 9.5 04/06/2022   MG 2.0 06/13/2017    Bone Lab Results  Component  Value Date   VD25OH 33.78 04/06/2022   25OHVITD1 20 (L) 06/13/2017   25OHVITD2 <1.0 06/13/2017   25OHVITD3 20 06/13/2017    Inflammation (CRP: Acute Phase) (ESR: Chronic Phase) Lab Results  Component Value Date   CRP 2.8 06/13/2017   ESRSEDRATE 13 06/13/2017         Note: Above Lab results reviewed.  Recent Imaging Review  DG Chest 2 View CLINICAL DATA:  Shortness of breath.  EXAM: CHEST - 2 VIEW  COMPARISON:  06/04/2015.  FINDINGS: The heart size and mediastinal contours are within normal limits. There is atherosclerotic  calcification of the aorta. No consolidation, effusion, or pneumothorax. No acute osseous abnormality.  IMPRESSION: No active cardiopulmonary disease.  Electronically Signed   By: Brett Fairy M.D.   On: 02/02/2022 03:05 Note: Reviewed        Physical Exam  General appearance: Well nourished, well developed, and well hydrated. In no apparent acute distress Mental status: Alert, oriented x 3 (person, place, & time)       Respiratory: No evidence of acute respiratory distress Eyes: PERLA Vitals: There were no vitals taken for this visit. BMI: Estimated body mass index is 39.56 kg/m as calculated from the following:   Height as of 04/06/22: 5' 8" (1.727 m).   Weight as of 04/06/22: 260 lb 3.2 oz (118 kg). Ideal: Ideal body weight: 68.4 kg (150 lb 12.7 oz) Adjusted ideal body weight: 88.3 kg (194 lb 8.9 oz)  Assessment   Diagnosis Status  No diagnosis found. Controlled Controlled Controlled   Updated Problems: No problems updated.  Plan of Care  Problem-specific:  No problem-specific Assessment & Plan notes found for this encounter.  Hector [name] has a current medication list which includes the following long-term medication(s): allopurinol, amlodipine, calcium carbonate, fluticasone, hydralazine, irbesartan, levothyroxine, omeprazole, rosuvastatin, and tramadol.  Pharmacotherapy (Medications Ordered): No orders of the defined types were placed in this encounter.  Orders:  No orders of the defined types were placed in this encounter.  Follow-up plan:   No follow-ups on file.     Interventional Therapies  Risk  Complexity Considerations:   POOR Candidate for any more RFA due to movement and body habitus.   Planned  Pending:   Pending further evaluation   Under consideration:   Diagnostic left L5 TFESI  Possible left L5 nerve root ganglion RFA  Diagnostic left L4-5 LESI    Completed:   Diagnostic midline caudal ESI x2 (07/03/2019) Palliative right lumbar  facet block x3 (08/07/2020) Palliative left lumbar facet block x2 (10/27/2017) Palliative right lumbar facet RFA x1 (04/18/2018)  Palliative left lumbar facet RFA x1 (03/21/2018)    Therapeutic  Palliative (PRN) options:   Diagnostic midline caudal ESI #3  Palliative right lumbar facet block #4  Palliative left lumbar facet block #3  Palliative right lumbar facet RFA #2  Palliative left lumbar facet RFA #2      Recent Visits Date Type Provider Dept  01/27/22 Office Visit Milinda Pointer, MD [facility]  Showing recent visits within past 90 days and meeting all other requirements Future Appointments Date Type Provider Dept  04/14/22 Appointment Milinda Pointer, MD [facility]  Showing future appointments within next 90 days and meeting all other requirements  I discussed the assessment and treatment plan with the patient. The patient was provided an opportunity to ask questions and all were answered. The patient agreed with the plan and demonstrated an understanding of the instructions.  Patient advised  to call back or seek an in-person evaluation if the symptoms or condition worsens.  Duration of encounter: *** minutes.  Total time on encounter, as per AMA guidelines included both the face-to-face and non-face-to-face time personally spent by the physician and/or other qualified health care professional(s) on the day of the encounter (includes time in activities that require the physician or other qualified health care professional and does not include time in activities normally performed by clinical staff). Physician's time may include the following activities when performed: preparing to see the patient (eg, review of tests, pre-charting review of records) obtaining and/or reviewing separately obtained history performing a medically appropriate examination and/or evaluation counseling and educating the patient/family/caregiver ordering medications, tests, or  procedures referring and communicating with other health care professionals (when not separately reported) documenting clinical information in the electronic or other health record independently interpreting results (not separately reported) and communicating results to the patient/ family/caregiver care coordination (not separately reported)  Note by: Gaspar Cola, MD Date: 04/14/2022; Time: 4:37 PM

## 2022-04-14 ENCOUNTER — Telehealth: Payer: Self-pay | Admitting: Internal Medicine

## 2022-04-14 ENCOUNTER — Other Ambulatory Visit: Payer: Self-pay | Admitting: Internal Medicine

## 2022-04-14 ENCOUNTER — Ambulatory Visit: Payer: Medicare Other | Attending: Pain Medicine | Admitting: Pain Medicine

## 2022-04-14 ENCOUNTER — Encounter: Payer: Self-pay | Admitting: Pain Medicine

## 2022-04-14 VITALS — BP 157/76 | HR 76 | Temp 97.0°F | Resp 18 | Ht 68.0 in | Wt 260.0 lb

## 2022-04-14 DIAGNOSIS — M47817 Spondylosis without myelopathy or radiculopathy, lumbosacral region: Secondary | ICD-10-CM | POA: Insufficient documentation

## 2022-04-14 DIAGNOSIS — M5137 Other intervertebral disc degeneration, lumbosacral region: Secondary | ICD-10-CM | POA: Insufficient documentation

## 2022-04-14 DIAGNOSIS — R937 Abnormal findings on diagnostic imaging of other parts of musculoskeletal system: Secondary | ICD-10-CM | POA: Insufficient documentation

## 2022-04-14 DIAGNOSIS — M47816 Spondylosis without myelopathy or radiculopathy, lumbar region: Secondary | ICD-10-CM | POA: Diagnosis not present

## 2022-04-14 DIAGNOSIS — G8929 Other chronic pain: Secondary | ICD-10-CM | POA: Insufficient documentation

## 2022-04-14 DIAGNOSIS — M4310 Spondylolisthesis, site unspecified: Secondary | ICD-10-CM | POA: Diagnosis not present

## 2022-04-14 DIAGNOSIS — M545 Low back pain, unspecified: Secondary | ICD-10-CM | POA: Insufficient documentation

## 2022-04-14 MED ORDER — IRBESARTAN 300 MG PO TABS: 300.0000 mg | ORAL_TABLET | Freq: Every day | ORAL | 1 refills | Status: DC

## 2022-04-14 MED ORDER — PREDNISONE 20 MG PO TABS: ORAL_TABLET | ORAL | 0 refills | Status: AC

## 2022-04-14 NOTE — Addendum Note (Signed)
Addended by: Adair Laundry on: 04/14/2022 01:44 PM   Modules accepted: Orders

## 2022-04-14 NOTE — Telephone Encounter (Signed)
Medication has been refilled.

## 2022-04-14 NOTE — Patient Instructions (Addendum)
______________________________________________________________________  Preparing for Procedure with Sedation  NOTICE: Due to recent regulatory changes, starting on March 02, 2021, procedures requiring intravenous (IV) sedation will no longer be performed at the [place].  These types of procedures are required to be performed at [name] ambulatory surgery facility.  We are very sorry for the inconvenience.  Procedure appointments are limited to planned procedures: No Prescription Refills. No disability issues will be discussed. No medication changes will be discussed.  Instructions: Oral Intake: Do not eat or drink anything for at least 8 hours prior to your procedure. (Exception: Blood Pressure Medication. See below.) Transportation: A driver is required. You may not drive yourself after the procedure. Blood Pressure Medicine: Do not forget to take your blood pressure medicine with a sip of water the morning of the procedure. If your Diastolic (lower reading) is above 100 mmHg, elective cases will be cancelled/rescheduled. Blood thinners: These will need to be stopped for procedures. Notify our staff if you are taking any blood thinners. Depending on which one you take, there will be specific instructions on how and when to stop it. Diabetics on insulin: Notify the staff so that you can be scheduled 1st case in the morning. If your diabetes requires high dose insulin, take only  of your normal insulin dose the morning of the procedure and notify the staff that you have done so. Preventing infections: Shower with an antibacterial soap the morning of your procedure. Build-up your immune system: Take 1000 mg of Vitamin C with every meal (3 times a day) the day prior to your procedure. Antibiotics: Inform the staff if you have a condition or reason that requires you to take antibiotics before dental procedures. Pregnancy: If you are pregnant, call and cancel the procedure. Sickness: If  you have a cold, fever, or any active infections, call and cancel the procedure. Arrival: You must be in the facility at least 30 minutes prior to your scheduled procedure. Children: Do not bring children with you. Dress appropriately: There is always the possibility that your clothing may get soiled. Valuables: Do not bring any jewelry or valuables.  Reasons to call and reschedule or cancel your procedure: (Following these recommendations will minimize the risk of a serious complication.) Surgeries: Avoid having procedures within 2 weeks of any surgery. (Avoid for 2 weeks before or after any surgery). Flu Shots: Avoid having procedures within 2 weeks of a flu shots. (Avoid for 2 weeks before or after immunizations). Barium: Avoid having a procedure within 7-10 days after having had a radiological study involving the use of radiological contrast. (Myelograms, Barium swallow or enema study). Heart attacks: Avoid any elective procedures or surgeries for the initial 6 months after a "Myocardial Infarction" (Heart Attack). Blood thinners: It is imperative that you stop these medications before procedures. Let us know if you if you take any blood thinner.  Infection: Avoid procedures during or within two weeks of an infection (including chest colds or gastrointestinal problems). Symptoms associated with infections include: Localized redness, fever, chills, night sweats or profuse sweating, burning sensation when voiding, cough, congestion, stuffiness, runny nose, sore throat, diarrhea, nausea, vomiting, cold or Flu symptoms, recent or current infections. It is specially important if the infection is over the area that we intend to treat. Heart and lung problems: Symptoms that may suggest an active cardiopulmonary problem include: cough, chest pain, breathing difficulties or shortness of breath, dizziness, ankle swelling, uncontrolled high or unusually low blood pressure, and/or palpitations. If you are  experiencing any of these symptoms, cancel your procedure and contact your primary care physician for an evaluation.  Remember:  Regular Business hours are:  Monday to Thursday 8:00 AM to 4:00 PM  Provider's Schedule: Hector Pointer, MD:  Procedure days: Tuesday and Thursday 7:30 AM to 4:00 PM  Hector Santa, MD:  Procedure days: Monday and Wednesday 7:30 AM to 4:00 PM ______________________________________________________________________  ____________________________________________________________________________________________  General Risks and Possible Complications  Patient Responsibilities: It is important that you read this as it is part of your informed consent. It is our duty to inform you of the risks and possible complications associated with treatments offered to you. It is your responsibility as a patient to read this and to ask questions about anything that is not clear or that you believe was not covered in this document.  Patient's Rights: You have the right to refuse treatment. You also have the right to change your mind, even after initially having agreed to have the treatment done. However, under this last option, if you wait until the last second to change your mind, you may be charged for the materials used up to that point.  Introduction: Medicine is not an Chief Strategy Officer. Everything in Medicine, including the lack of treatment(s), carries the potential for danger, harm, or loss (which is by definition: Risk). In Medicine, a complication is a secondary problem, condition, or disease that can aggravate an already existing one. All treatments carry the risk of possible complications. The fact that a side effects or complications occurs, does not imply that the treatment was conducted incorrectly. It must be clearly understood that these can happen even when everything is done following the highest safety standards.  No treatment: You can choose not to proceed with the  proposed treatment alternative. The "PRO(s)" would include: avoiding the risk of complications associated with the therapy. The "CON(s)" would include: not getting any of the treatment benefits. These benefits fall under one of three categories: diagnostic; therapeutic; and/or palliative. Diagnostic benefits include: getting information which can ultimately lead to improvement of the disease or symptom(s). Therapeutic benefits are those associated with the successful treatment of the disease. Finally, palliative benefits are those related to the decrease of the primary symptoms, without necessarily curing the condition (example: decreasing the pain from a flare-up of a chronic condition, such as incurable terminal cancer).  General Risks and Complications: These are associated to most interventional treatments. They can occur alone, or in combination. They fall under one of the following six (6) categories: no benefit or worsening of symptoms; bleeding; infection; nerve damage; allergic reactions; and/or death. No benefits or worsening of symptoms: In Medicine there are no guarantees, only probabilities. No healthcare provider can ever guarantee that a medical treatment will work, they can only state the probability that it may. Furthermore, there is always the possibility that the condition may worsen, either directly, or indirectly, as a consequence of the treatment. Bleeding: This is more common if the patient is taking a blood thinner, either prescription or over the counter (example: Goody Powders, Fish oil, Aspirin, Garlic, etc.), or if suffering a condition associated with impaired coagulation (example: Hemophilia, cirrhosis of the liver, low platelet counts, etc.). However, even if you do not have one on these, it can still happen. If you have any of these conditions, or take one of these drugs, make sure to notify your treating physician. Infection: This is more common in patients with a compromised  immune system, either due to disease (example:  diabetes, cancer, human immunodeficiency virus [HIV], etc.), or due to medications or treatments (example: therapies used to treat cancer and rheumatological diseases). However, even if you do not have one on these, it can still happen. If you have any of these conditions, or take one of these drugs, make sure to notify your treating physician. Nerve Damage: This is more common when the treatment is an invasive one, but it can also happen with the use of medications, such as those used in the treatment of cancer. The damage can occur to small secondary nerves, or to large primary ones, such as those in the spinal cord and brain. This damage may be temporary or permanent and it may lead to impairments that can range from temporary numbness to permanent paralysis and/or brain death. Allergic Reactions: Any time a substance or material comes in contact with our body, there is the possibility of an allergic reaction. These can range from a mild skin rash (contact dermatitis) to a severe systemic reaction (anaphylactic reaction), which can result in death. Death: In general, any medical intervention can result in death, most of the time due to an unforeseen complication. ____________________________________________________________________________________________ Epidural Steroid Injection Patient Information  Description: The epidural space surrounds the nerves as they exit the spinal cord.  In some patients, the nerves can be compressed and inflamed by a bulging disc or a tight spinal canal (spinal stenosis).  By injecting steroids into the epidural space, we can bring irritated nerves into direct contact with a potentially helpful medication.  These steroids act directly on the irritated nerves and can reduce swelling and inflammation which often leads to decreased pain.  Epidural steroids may be injected anywhere along the spine and from the neck to the low back  depending upon the location of your pain.   After numbing the skin with local anesthetic (like Novocaine), a small needle is passed into the epidural space slowly.  You may experience a sensation of pressure while this is being done.  The entire block usually last less than 10 minutes.  Conditions which may be treated by epidural steroids:  Low back and leg pain Neck and arm pain Spinal stenosis Post-laminectomy syndrome Herpes zoster (shingles) pain Pain from compression fractures  Preparation for the injection:  Do not eat any solid food or dairy products within 8 hours of your appointment.  You may drink clear liquids up to 3 hours before appointment.  Clear liquids include water, black coffee, juice or soda.  No milk or cream please. You may take your regular medication, including pain medications, with a sip of water before your appointment  Diabetics should hold regular insulin (if taken separately) and take 1/2 normal NPH dos the morning of the procedure.  Carry some sugar containing items with you to your appointment. A driver must accompany you and be prepared to drive you home after your procedure.  Bring all your current medications with your. An IV may be inserted and sedation may be given at the discretion of the physician.   A blood pressure cuff, EKG and other monitors will often be applied during the procedure.  Some patients may need to have extra oxygen administered for a short period. You will be asked to provide medical information, including your allergies, prior to the procedure.  We must know immediately if you are taking blood thinners (like Coumadin/Warfarin)  Or if you are allergic to IV iodine contrast (dye). We must know if you could possible be pregnant.  Possible  side-effects: Bleeding from needle site Infection (rare, may require surgery) Nerve injury (rare) Numbness & tingling (temporary) Difficulty urinating (rare, temporary) Spinal headache ( a headache  worse with upright posture) Light -headedness (temporary) Pain at injection site (several days) Decreased blood pressure (temporary) Weakness in arm/leg (temporary) Pressure sensation in back/neck (temporary)  Call if you experience: Fever/chills associated with headache or increased back/neck pain. Headache worsened by an upright position. New onset weakness or numbness of an extremity below the injection site Hives or difficulty breathing (go to the emergency room) Inflammation or drainage at the infection site Severe back/neck pain Any new symptoms which are concerning to you  Please note:  Although the local anesthetic injected can often make your back or neck feel good for several hours after the injection, the pain will likely return.  It takes 3-7 days for steroids to work in the epidural space.  You may not notice any pain relief for at least that one week.  If effective, we will often do a series of three injections spaced 3-6 weeks apart to maximally decrease your pain.  After the initial series, we generally will wait several months before considering a repeat injection of the same type.  If you have any questions, please call [phone] [facility]   ______________________________________________________________________________________________  Body mass index (BMI)  Body mass index (BMI) is a common tool for deciding whether a person has an appropriate body weight.  It measures a persons weight in relation to their height.   According to the [facility] of health (NIH): A BMI of less than 18.5 means that a person is underweight. A BMI of between 18.5 and 24.9 is ideal. A BMI of between 25 and 29.9 is overweight. A BMI over 30 indicates obesity.  Weight Management Required  URGENT: Your weight has been found to be adversely affecting your health.  Dear Hector [name]:  Your current Estimated body mass index is 39.53 kg/m as  calculated from the following:   Height as of this encounter: 5' 8" (1.727 m).   Weight as of this encounter: 260 lb (117.9 kg).  Please use the table below to identify your weight category and associated incidence of chronic pain, secondary to your weight.  Body Mass Index (BMI) Classification BMI level (kg/m2) Category Associated incidence of chronic pain  <18  Underweight   18.5-24.9 Ideal body weight   25-29.9 Overweight  20%  30-34.9 Obese (Class I)  68%  35-39.9 Severe obesity (Class II)  136%  >40 Extreme obesity (Class III)  254%   In addition: You will be considered "Morbidly Obese", if your BMI is above 30 and you have one or more of the following conditions which are known to be caused and/or directly associated with obesity: 1.    Type 2 Diabetes (Which in turn can lead to cardiovascular diseases (CVD), stroke, peripheral vascular diseases (PVD), retinopathy, nephropathy, and neuropathy) 2.    Cardiovascular Disease (High Blood Pressure; Congestive Heart Failure; High Cholesterol; Coronary Artery Disease; Angina; or History of Heart Attacks) 3.    Breathing problems (Asthma; obesity-hypoventilation syndrome; obstructive sleep apnea; chronic inflammatory airway disease; reactive airway disease; or shortness of breath) 4.    Chronic kidney disease 5.    Liver disease (nonalcoholic fatty liver disease) 6.    High blood pressure 7.    Acid reflux (gastroesophageal reflux disease; heartburn) 8.    Osteoarthritis (OA) (with any of the following: hip pain; knee pain; and/or  low back pain) 9.    Low back pain (Lumbar Facet Syndrome; and/or Degenerative Disc Disease) 10.  Hip pain (Osteoarthritis of hip) (For every 1 lbs of added body weight, there is a 2 lbs increase in pressure inside of each hip articulation. 1:2 mechanical relationship) 11.  Knee pain (Osteoarthritis of knee) (For every 1 lbs of added body weight, there is a 4 lbs increase in pressure inside of each knee  articulation. 1:4 mechanical relationship) (patients with a BMI>30 kg/m2 were 6.8 times more likely to develop knee OA than normal-weight individuals) 12.  Cancer: Epidemiological studies have shown that obesity is a risk factor for: post-menopausal breast cancer; cancers of the endometrium, colon and kidney cancer; malignant adenomas of the oesophagus. Obese subjects have an approximately 1.5-3.5-fold increased risk of developing these cancers compared with normal-weight subjects, and it has been estimated that between 15 and 45% of these cancers can be attributed to overweight. More recent studies suggest that obesity may also increase the risk of other types of cancer, including pancreatic, hepatic and gallbladder cancer. (Ref: Obesity and cancer. Pischon T, Nthlings U, [employer] H. Proc Nutr Soc. 2008 May;67(2):128-45. doi: 19.5093/O6712458099833825.) The International Agency for Research on Cancer (IARC) has identified 13 cancers associated with overweight and obesity: meningioma, multiple myeloma, adenocarcinoma of the esophagus, and cancers of the thyroid, postmenopausal breast cancer, gallbladder, stomach, liver, pancreas, kidney, ovaries, uterus, colon and rectal (colorectal) cancers. 50 percent of all cancers diagnosed in women and 24 percent of those diagnosed in men are associated with overweight and obesity.  Recommendation: At this point it is urgent that you take a step back and concentrate in loosing weight. Dedicate 100% of your efforts on this task. Nothing else will improve your health more than bringing your weight down and your BMI to less than 30. If you are here, you probably have chronic pain. We know that most chronic pain patients have difficulty exercising secondary to their pain. For this reason, you must rely on proper nutrition and diet in order to lose the weight. If your BMI is above 40, you should seriously consider bariatric surgery. A realistic goal is to lose 10% of your body  weight over a period of 12 months.  Be honest to yourself, if over time you have unsuccessfully tried to lose weight, then it is time for you to seek professional help and to enter a medically supervised weight management program, and/or undergo bariatric surgery. Stop procrastinating.   Pain management considerations:  1.    Pharmacological Problems: Be advised that the use of opioid analgesics (oxycodone; hydrocodone; morphine; methadone; codeine; and all of their derivatives) have been associated with decreased metabolism and weight gain.  For this reason, should we see that you are unable to lose weight while taking these medications, it may become necessary for [place] to taper down and indefinitely discontinue them.  2.    Technical Problems: The incidence of successful interventional therapies decreases as the patient's BMI increases. It is much more difficult to accomplish a safe and effective interventional therapy on a patient with a BMI above 35. 3.    Radiation Exposure Problems: The x-rays machine, used to accomplish injection therapies, will automatically increase their x-ray output in order to capture an appropriate bone image. This means that radiation exposure increases exponentially with the patient's BMI. (The higher the BMI, the higher the radiation exposure.) Although the level of radiation used at a given time is still safe to the patient, it is not  for the physician and/or assisting staff. Unfortunately, radiation exposure is accumulative. Because physicians and the staff have to do procedures and be exposed on a daily basis, this can result in health problems such as cancer and radiation burns. Radiation exposure to the staff is monitored by the radiation batches that they wear. The exposure levels are reported back to the staff on a quarterly basis. Depending on levels of exposure, physicians and staff may be obligated by law to decrease this exposure. This means that they have the right and  obligation to refuse providing therapies where they may be overexposed to radiation. For this reason, physicians may decline to offer therapies such as radiofrequency ablation or implants to patients with a BMI above 40. 4.    Current Trends: Be advised that the current trend is to no longer offer certain therapies to patients with a BMI equal to, or above 35, due to increase perioperative risks, increased technical procedural difficulties, and excessive radiation exposure to healthcare personnel.  ______________________________________________________________________________________________

## 2022-04-14 NOTE — Telephone Encounter (Signed)
[place] pharmacy call, she needs a prescription for patient's irbesartan (AVAPRO) 300 MG tablet.

## 2022-04-14 NOTE — Progress Notes (Signed)
Nursing Pain Medication Assessment:  Safety precautions to be maintained throughout the outpatient stay will include: orient to surroundings, keep bed in low position, maintain call bell within reach at all times, provide assistance with transfer out of bed and ambulation.  Medication Inspection Compliance: Pill count conducted under aseptic conditions, in front of the patient. Neither the pills nor the bottle was removed from the patient's sight at any time. Once count was completed pills were immediately returned to the patient in their original bottle.  Medication: Tramadol (Ultram) Pill/Patch Count:  3 of 90 pills remain Pill/Patch Appearance: Markings consistent with prescribed medication Bottle Appearance: Standard pharmacy container. Clearly labeled. Filled Date: 08 / 11 / 2023 Last Medication intake:  Today

## 2022-04-19 NOTE — Progress Notes (Unsigned)
PROVIDER NOTE: Interpretation of information contained herein should be left to medically-trained personnel. Specific patient instructions are provided elsewhere under "Patient Instructions" section of medical record. This document was created in part using STT-dictation technology, any transcriptional errors that may result from this process are unintentional.  Patient: Hector [name] Type: Established DOB: 1935-01-02 MRN: [id number] PCP: Einar Pheasant, MD  Service: Procedure DOS: 04/20/2022 Setting: Ambulatory Location: Ambulatory outpatient facility Delivery: Face-to-face Provider: Gaspar Cola, MD Specialty: Interventional Pain Management Specialty designation: 09 Location: Outpatient facility Ref. Prov.: Milinda Pointer, MD    Interventional Therapy      Procedure: Caudal Epidural Steroid Injection #1  Laterality: Midline aiming right Level: Sacrococcygeal ligament  Imaging: Fluoroscopy-guided         Anesthesia: Local anesthesia (1-2% Lidocaine) Anxiolysis: IV Versed 1.5 mg Sedation: No Sedation                       DOS: 04/20/2022  Performed by: Gaspar Cola, MD  Purpose: Diagnostic/Therapeutic Indications: Low back and lower extremity pain severe enough to impact quality of life or function. Rationale (medical necessity): procedure needed and proper for the diagnosis and/or treatment of Mr. Hijazi's medical symptoms and needs. 1. DDD (degenerative disc disease), lumbosacral   2. Lumbar foraminal stenosis (L5-S1) (Bilateral) (L>R)   3. Lumbosacral Grade 1 Anterolisthesis of L5 over S1   4. Osteoarthritis of lumbar spine   5. L5-S1 bilateral pars defect with spondylolisthesis   6. Chronic Lumbar radiculitis (L5) (Left)   7. Chronic lower extremity radicular pain (L5) (Left)   8. Chronic lower extremity pain (2ry area of Pain) (Left)   9. Chronic low back pain (1ry area of Pain) (Bilateral) (L>R) w/ sciatica (Left)   10. Abnormal MRI, lumbar spine (08/22/2021)     NAS-11 Pain score:   Pre-procedure: 5 /10   Post-procedure: 5 /10     Target: Lumbosacral epidural canal  Location: Epidural space  Region: Caudal canal Approach: Percutaneous  Type of procedure: Epidural block  Position  Prep  Materials:  Position: Prone  Prep solution: DuraPrep (Iodine Povacrylex [0.7% available iodine] and Isopropyl Alcohol, 74% w/w) Prep Area: Entire posterior lumbosacral area  Materials:  Tray: Epidural Needle(s):  Type: Epidural  Gauge (G): 17  Length: 3.5-in  Qty: 1  Pre-op H&P Assessment:  Mr. Gibbons is a 87 y.o. (year old), male patient, seen today for interventional treatment. He  has a past surgical history that includes Prostate surgery; cystocopy (2003); Hemorrhoid surgery; Cardiac catheterization; sleep study; Colon surgery (2006-2008-2011); Cataract Surgery (Right, 02/13/14); Esophagogastroduodenoscopy (egd) with propofol (N/A, 08/04/2016); Colonoscopy w/ polypectomy; Colonoscopy with propofol (N/A, 02/15/2018); Colonoscopy with propofol (N/A, 03/24/2020); Esophagogastroduodenoscopy (egd) with propofol (N/A, 03/24/2020); and Cystoscopy/retrograde/ureteroscopy/stone extraction with basket (Right, 10/28/2020). Mr. Krejci has a current medication list which includes the following prescription(s): acetaminophen, allopurinol, amlodipine, aspirin, calcium carbonate, fluticasone, hydralazine, irbesartan, levothyroxine, lysine, omeprazole, prednisone, rosuvastatin, senna, tramadol, valacyclovir, and vitamin d, and the following Facility-Administered Medications: lactated ringers and pentafluoroprop-tetrafluoroeth. His primarily concern today is the Back Pain (low)  Initial Vital Signs:  Pulse/HCG Rate: 64ECG Heart Rate: 94 Temp: (!) 97.4 F (36.3 C) Resp: 18 BP: (!) 167/77 SpO2: 98 %  BMI: Estimated body mass index is 39.53 kg/m as calculated from the following:   Height as of this encounter: '5\' 8"'$  (1.727 m).   Weight as of this encounter: 260 lb (117.9  kg).  Risk Assessment: Allergies: Reviewed. He is allergic to tape.  Allergy Precautions: None  required Coagulopathies: Reviewed. None identified.  Blood-thinner therapy: None at this time Active Infection(s): Reviewed. None identified. Mr. Lortie is afebrile  Site Confirmation: Mr. Dishman was asked to confirm the procedure and laterality before marking the site Procedure checklist: Completed Consent: Before the procedure and under the influence of no sedative(s), amnesic(s), or anxiolytics, the patient was informed of the treatment options, risks and possible complications. To fulfill our ethical and legal obligations, as recommended by the American Medical Association's Code of Ethics, I have informed the patient of my clinical impression; the nature and purpose of the treatment or procedure; the risks, benefits, and possible complications of the intervention; the alternatives, including doing nothing; the risk(s) and benefit(s) of the alternative treatment(s) or procedure(s); and the risk(s) and benefit(s) of doing nothing. The patient was provided information about the general risks and possible complications associated with the procedure. These may include, but are not limited to: failure to achieve desired goals, infection, bleeding, organ or nerve damage, allergic reactions, paralysis, and death. In addition, the patient was informed of those risks and complications associated to Spine-related procedures, such as failure to decrease pain; infection (i.e.: Meningitis, epidural or intraspinal abscess); bleeding (i.e.: epidural hematoma, subarachnoid hemorrhage, or any other type of intraspinal or peri-dural bleeding); organ or nerve damage (i.e.: Any type of peripheral nerve, nerve root, or spinal cord injury) with subsequent damage to sensory, motor, and/or autonomic systems, resulting in permanent pain, numbness, and/or weakness of one or several areas of the body; allergic reactions; (i.e.:  anaphylactic reaction); and/or death. Furthermore, the patient was informed of those risks and complications associated with the medications. These include, but are not limited to: allergic reactions (i.e.: anaphylactic or anaphylactoid reaction(s)); adrenal axis suppression; blood sugar elevation that in diabetics may result in ketoacidosis or comma; water retention that in patients with history of congestive heart failure may result in shortness of breath, pulmonary edema, and decompensation with resultant heart failure; weight gain; swelling or edema; medication-induced neural toxicity; particulate matter embolism and blood vessel occlusion with resultant organ, and/or nervous system infarction; and/or aseptic necrosis of one or more joints. Finally, the patient was informed that Medicine is not an exact science; therefore, there is also the possibility of unforeseen or unpredictable risks and/or possible complications that may result in a catastrophic outcome. The patient indicated having understood very clearly. We have given the patient no guarantees and we have made no promises. Enough time was given to the patient to ask questions, all of which were answered to the patient's satisfaction. Mr. Pigeon has indicated that he wanted to continue with the procedure. Attestation: I, the ordering provider, attest that I have discussed with the patient the benefits, risks, side-effects, alternatives, likelihood of achieving goals, and potential problems during recovery for the procedure that I have provided informed consent. Date  Time: 04/20/2022 10:58 AM  Imaging Guidance (Spinal):          Type of Imaging Technique: Fluoroscopy Guidance (Spinal) Indication(s): Assistance in needle guidance and placement for procedures requiring needle placement in or near specific anatomical locations not easily accessible without such assistance. Exposure Time: Please see nurses notes. Contrast: Before injecting any  contrast, we confirmed that the patient did not have an allergy to iodine, shellfish, or radiological contrast. Once satisfactory needle placement was completed at the desired level, radiological contrast was injected. Contrast injected under live fluoroscopy. No contrast complications. See chart for type and volume of contrast used. Fluoroscopic Guidance: I was personally present during the  use of fluoroscopy. "Tunnel Vision Technique" used to obtain the best possible view of the target area. Parallax error corrected before commencing the procedure. "Direction-depth-direction" technique used to introduce the needle under continuous pulsed fluoroscopy. Once target was reached, antero-posterior, oblique, and lateral fluoroscopic projection used confirm needle placement in all planes. Images permanently stored in EMR. Interpretation: I personally interpreted the imaging intraoperatively. Adequate needle placement confirmed in multiple planes. Appropriate spread of contrast into desired area was observed. No evidence of afferent or efferent intravascular uptake. No intrathecal or subarachnoid spread observed. Permanent images saved into the patient's record.  Pre-Procedure Preparation:  Monitoring: As per clinic protocol. Respiration, ETCO2, SpO2, BP, heart rate and rhythm monitor placed and checked for adequate function Safety Precautions: Patient was assessed for positional comfort and pressure points before starting the procedure. Time-out: I initiated and conducted the "Time-out" before starting the procedure, as per protocol. The patient was asked to participate by confirming the accuracy of the "Time Out" information. Verification of the correct person, site, and procedure were performed and confirmed by me, the nursing staff, and the patient. "Time-out" conducted as per Joint Commission's Universal Protocol (UP.01.01.01). Time: 1127  Description  Narrative of Procedure:          Procedural Technique  Safety Precautions: Aspiration looking for blood return was conducted prior to all injections. At no point did we inject any substances, as a needle was being advanced. No attempts were made at seeking any paresthesias. Safe injection practices and needle disposal techniques used. Medications properly checked for expiration dates. SDV (single dose vial) medications used. Description of the Procedure: Protocol guidelines were followed. The patient was assisted into a comfortable position. The target area was identified and the area prepped in the usual manner. Skin & deeper tissues infiltrated with local anesthetic. Appropriate amount of time allowed to pass for local anesthetics to take effect. The procedure needles were then advanced to the target area. Proper needle placement secured. Negative aspiration confirmed. Solution injected in intermittent fashion, asking for systemic symptoms every 0.5cc of injectate. The needles were then removed and the area cleansed, making sure to leave some of the prepping solution back to take advantage of its long term bactericidal properties.  Technical description of procedure:  Safety Precautions: Aspiration looking for blood return was conducted prior to all injections. At no point did we inject any substances, as a needle was being advanced. No attempts were made at seeking any paresthesias. Safe injection practices and needle disposal techniques used. Medications properly checked for expiration dates. SDV (single dose vial) medications used. Description of the Procedure: Protocol guidelines were followed. The patient was placed in position over the fluoroscopy table. The target area was identified and the area prepped in the usual manner. Skin & deeper tissues infiltrated with local anesthetic. Appropriate amount of time allowed to pass for local anesthetics to take effect. The procedure needle was then advanced to the target area. Proper needle placement secured.  Negative aspiration confirmed. Solution injected in intermittent fashion, asking for systemic symptoms every 0.5cc of injectate. The needles were then removed and the area cleansed, making sure to leave some of the prepping solution back to take advantage of its long term bactericidal properties.  Vitals:   04/20/22 1058 04/20/22 1100 04/20/22 1130 04/20/22 1133  BP: (!) 167/77 (!) 203/97 (!) 194/99 (!) 164/100  Pulse:      Resp:  '18 16 16  '$ Temp:      SpO2:  98% 93% 93%  Weight:      Height:         Start Time: 1127 hrs. End Time: 1132 hrs.  Post-operative Assessment:  Post-procedure Vital Signs:  Pulse/HCG Rate: 6476 Temp: (!) 97.4 F (36.3 C) Resp: 16 BP: (!) 164/100 SpO2: 93 %  EBL: None  Complications: No immediate post-treatment complications observed by team, or reported by patient.  Note: The patient tolerated the entire procedure well. A repeat set of vitals were taken after the procedure and the patient was kept under observation following institutional policy, for this type of procedure. Post-procedural neurological assessment was performed, showing return to baseline, prior to discharge. The patient was provided with post-procedure discharge instructions, including a section on how to identify potential problems. Should any problems arise concerning this procedure, the patient was given instructions to immediately contact us, at any time, without hesitation. In any case, we plan to contact the patient by telephone for a follow-up status report regarding this interventional procedure.  Comments:  No additional relevant information.  Plan of Care  Orders:  Orders Placed This Encounter  Procedures   Caudal Epidural Injection    Scheduling Instructions:     Laterality: Right-sided     Level(s): Sacrococcygeal canal (Tailbone area)     Sedation: Patient's choice     Timeframe: Today    Order Specific Question:   Where will this procedure be performed?    Answer:    [facility]   DG [facility]IN [facility] C-ARM 1-60 MIN NO REPORT    Intraoperative interpretation by procedural physician at [place].    Standing Status:   Standing    Number of Occurrences:   1    Order Specific Question:   Reason for exam:    Answer:   Assistance in needle guidance and placement for procedures requiring needle placement in or near specific anatomical locations not easily accessible without such assistance.   Informed Consent Details: Physician/Practitioner Attestation; Transcribe to consent form and obtain patient signature    Nursing Order: Transcribe to consent form and obtain patient signature. Note: Always confirm laterality of pain with Mr. Seeley, before procedure.    Order Specific Question:   Physician/Practitioner attestation of informed consent for procedure/surgical case    Answer:   I, the physician/practitioner, attest that I have discussed with the patient the benefits, risks, side effects, alternatives, likelihood of achieving goals and potential problems during recovery for the procedure that I have provided informed consent.    Order Specific Question:   Procedure    Answer:   Caudal epidural steroid injection    Order Specific Question:   Physician/Practitioner performing the procedure    Answer:   Ameet Sandy A. Dossie Arbour, MD    Order Specific Question:   Indication/Reason    Answer:   Low back pain and lower extremity pain secondary to lumbosacral radiculitis   Provide equipment / supplies at bedside    "Epidural Tray" (Disposable  single use) Catheter: NOT required    Standing Status:   Standing    Number of Occurrences:   1    Order Specific Question:   Specify    Answer:   Epidural Tray   Chronic Opioid Analgesic:  Tramadol 50 mg, 1 tab PO q 8 hrs (150 mg/day of tramadol) MME/day: 15 mg/day.   Medications ordered for procedure: Meds ordered this encounter  Medications   iohexol (OMNIPAQUE) 180 MG/ML injection 10 mL    Must be  Myelogram-compatible. If not available, you  may substitute with a water-soluble, non-ionic, hypoallergenic, myelogram-compatible radiological contrast medium.   lidocaine (XYLOCAINE) 2 % (with pres) injection 400 mg   pentafluoroprop-tetrafluoroeth (GEBAUERS) aerosol   lactated ringers infusion   midazolam (VERSED) 5 MG/5ML injection 0.5-2 mg    Make sure Flumazenil is available in the pyxis when using this medication. If oversedation occurs, administer 0.2 mg IV over 15 sec. If after 45 sec no response, administer 0.2 mg again over 1 min; may repeat at 1 min intervals; not to exceed 4 doses (1 mg)   sodium chloride flush (NS) 0.9 % injection 2 mL   ropivacaine (PF) 2 mg/mL (0.2%) (NAROPIN) injection 2 mL   triamcinolone acetonide (KENALOG-40) injection 40 mg   Medications administered: We administered iohexol, lidocaine, lactated ringers, midazolam, sodium chloride flush, ropivacaine (PF) 2 mg/mL (0.2%), and triamcinolone acetonide.  See the medical record for exact dosing, route, and time of administration.  Follow-up plan:   Return in about 2 weeks (around 05/04/2022) for Proc-day (T,Th), (F2F), (PPE).       Interventional Therapies  Risk  Complexity Considerations:   Estimated body mass index is 39.53 kg/m as calculated from the following:   Height as of this encounter: '5\' 8"'$  (1.727 m).   Weight as of this encounter: 260 lb (117.9 kg). POOR Candidate for any more RFA due to movement and body habitus.   Planned  Pending:   Therapeutic right caudal ESI #1 (04/20/2022)    Under consideration:   Diagnostic/therapeutic bilateral L5 TFESI #1    Completed:   Diagnostic midline-left caudal ESI x3 (08/27/2021) (100/100/90/90)  Palliative right lumbar facet MBB (L2-S1) x3 (08/07/2020) (100/100/100/100)  Palliative left lumbar facet MBB (L2-S1) x2 (10/27/2017) (100/100/95/75-100)  Palliative right lumbar facet RFA (L2-S1) x1 (04/18/2018) (100/100/100/100)  Palliative left lumbar facet RFA  (L2-S1) x1 (03/21/2018) (75/75/80/80)    Completed by other providers:   None at this time   Therapeutic  Palliative (PRN) options:   Diagnostic midline caudal ESI #3  Palliative right lumbar facet block #4  Palliative left lumbar facet block #3  Palliative right lumbar facet RFA #2  Palliative left lumbar facet RFA #2      Recent Visits Date Type Provider Dept  04/14/22 Office Visit Milinda Pointer, MD [facility]  01/27/22 Office Visit Milinda Pointer, MD [facility]  Showing recent visits within past 90 days and meeting all other requirements Today's Visits Date Type Provider Dept  04/20/22 Procedure visit Milinda Pointer, MD [facility]  Showing today's visits and meeting all other requirements Future Appointments No visits were found meeting these conditions. Showing future appointments within next 90 days and meeting all other requirements  Disposition: Discharge home  Discharge (Date  Time): 04/20/2022;   hrs.   Primary Care Physician: Einar Pheasant, MD Location: [facility] Note by: Gaspar Cola, MD Date: 04/20/2022; Time: 11:38 AM  Disclaimer:  Medicine is not an exact science. The only guarantee in medicine is that nothing is guaranteed. It is important to note that the decision to proceed with this intervention was based on the information collected from the patient. The Data and conclusions were drawn from the patient's questionnaire, the interview, and the physical examination. Because the information was provided in large part by the patient, it cannot be guaranteed that it has not been purposely or unconsciously manipulated. Every effort has been made to obtain as much relevant data as possible for this evaluation. It is important to note that  the conclusions that lead to this procedure are derived in large part from the available data. Always take into account that the treatment will also be  dependent on availability of resources and existing treatment guidelines, considered by other Pain Management Practitioners as being common knowledge and practice, at the time of the intervention. For Medico-Legal purposes, it is also important to point out that variation in procedural techniques and pharmacological choices are the acceptable norm. The indications, contraindications, technique, and results of the above procedure should only be interpreted and judged by a Board-Certified Interventional Pain Specialist with extensive familiarity and expertise in the same exact procedure and technique.

## 2022-04-20 ENCOUNTER — Ambulatory Visit
Admission: RE | Admit: 2022-04-20 | Discharge: 2022-04-20 | Disposition: A | Discharge status: Home / Self Care | Payer: Medicare Other | Source: Ambulatory Visit | Attending: Pain Medicine | Admitting: Pain Medicine

## 2022-04-20 ENCOUNTER — Ambulatory Visit: Payer: Medicare Other | Attending: Pain Medicine | Admitting: Pain Medicine

## 2022-04-20 ENCOUNTER — Encounter: Payer: Self-pay | Admitting: Pain Medicine

## 2022-04-20 VITALS — BP 155/88 | HR 64 | Temp 97.4°F | Resp 16 | Ht 68.0 in | Wt 260.0 lb

## 2022-04-20 DIAGNOSIS — M5410 Radiculopathy, site unspecified: Secondary | ICD-10-CM | POA: Insufficient documentation

## 2022-04-20 DIAGNOSIS — G8929 Other chronic pain: Secondary | ICD-10-CM | POA: Diagnosis not present

## 2022-04-20 DIAGNOSIS — M5442 Lumbago with sciatica, left side: Secondary | ICD-10-CM | POA: Insufficient documentation

## 2022-04-20 DIAGNOSIS — M79605 Pain in left leg: Secondary | ICD-10-CM | POA: Diagnosis not present

## 2022-04-20 DIAGNOSIS — R937 Abnormal findings on diagnostic imaging of other parts of musculoskeletal system: Secondary | ICD-10-CM | POA: Insufficient documentation

## 2022-04-20 DIAGNOSIS — M5416 Radiculopathy, lumbar region: Secondary | ICD-10-CM | POA: Diagnosis not present

## 2022-04-20 DIAGNOSIS — M5137 Other intervertebral disc degeneration, lumbosacral region: Secondary | ICD-10-CM | POA: Diagnosis not present

## 2022-04-20 DIAGNOSIS — M47816 Spondylosis without myelopathy or radiculopathy, lumbar region: Secondary | ICD-10-CM | POA: Insufficient documentation

## 2022-04-20 DIAGNOSIS — M4310 Spondylolisthesis, site unspecified: Secondary | ICD-10-CM | POA: Insufficient documentation

## 2022-04-20 DIAGNOSIS — M48061 Spinal stenosis, lumbar region without neurogenic claudication: Secondary | ICD-10-CM | POA: Insufficient documentation

## 2022-04-20 MED ORDER — TRIAMCINOLONE ACETONIDE 40 MG/ML IJ SUSP
40.0000 mg | Freq: Once | INTRAMUSCULAR | Status: AC
  Administered 2022-04-20: 40 mg

## 2022-04-20 MED ORDER — LACTATED RINGERS IV SOLN: Freq: Once | INTRAVENOUS | Status: AC

## 2022-04-20 MED ORDER — MIDAZOLAM HCL 5 MG/5ML IJ SOLN
0.5000 mg | Freq: Once | INTRAMUSCULAR | Status: AC
  Administered 2022-04-20: 1.5 mg via INTRAVENOUS
  Filled 2022-04-20: qty 5

## 2022-04-20 MED ORDER — SODIUM CHLORIDE (PF) 0.9 % IJ SOLN
INTRAMUSCULAR | Status: AC
  Filled 2022-04-20: qty 10

## 2022-04-20 MED ORDER — IOHEXOL 180 MG/ML  SOLN
10.0000 mL | Freq: Once | INTRAMUSCULAR | Status: AC
  Administered 2022-04-20: 10 mL via EPIDURAL
  Filled 2022-04-20: qty 20

## 2022-04-20 MED ORDER — TRIAMCINOLONE ACETONIDE 40 MG/ML IJ SUSP
INTRAMUSCULAR | Status: AC
  Filled 2022-04-20: qty 1

## 2022-04-20 MED ORDER — LIDOCAINE HCL 2 % IJ SOLN
20.0000 mL | Freq: Once | INTRAMUSCULAR | Status: AC
  Administered 2022-04-20: 400 mg
  Filled 2022-04-20: qty 20

## 2022-04-20 MED ORDER — SODIUM CHLORIDE 0.9% FLUSH
2.0000 mL | Freq: Once | INTRAVENOUS | Status: AC
  Administered 2022-04-20: 2 mL

## 2022-04-20 MED ORDER — PENTAFLUOROPROP-TETRAFLUOROETH EX AERO
INHALATION_SPRAY | Freq: Once | CUTANEOUS | Status: DC
  Filled 2022-04-20: qty 116

## 2022-04-20 MED ORDER — ROPIVACAINE HCL 2 MG/ML IJ SOLN
2.0000 mL | Freq: Once | INTRAMUSCULAR | Status: AC
  Administered 2022-04-20: 2 mL via EPIDURAL
  Filled 2022-04-20: qty 20

## 2022-04-20 NOTE — Progress Notes (Signed)
Safety precautions to be maintained throughout the outpatient stay will include: orient to surroundings, keep bed in low position, maintain call bell within reach at all times, provide assistance with transfer out of bed and ambulation.  

## 2022-04-20 NOTE — Patient Instructions (Signed)

## 2022-04-21 ENCOUNTER — Encounter: Payer: Self-pay | Admitting: Internal Medicine

## 2022-04-21 ENCOUNTER — Telehealth: Payer: Self-pay

## 2022-04-21 DIAGNOSIS — N1831 Chronic kidney disease, stage 3a: Secondary | ICD-10-CM

## 2022-04-21 NOTE — Telephone Encounter (Signed)
Post procedure phone call. Patient states he is doing well.  

## 2022-04-21 NOTE — Telephone Encounter (Signed)
Order placed for renal ultrasound.

## 2022-04-26 ENCOUNTER — Encounter: Payer: Self-pay | Admitting: Pain Medicine

## 2022-04-29 ENCOUNTER — Ambulatory Visit: Payer: Medicare Other | Admitting: Dermatology

## 2022-04-30 ENCOUNTER — Ambulatory Visit
Admission: RE | Admit: 2022-04-30 | Discharge: 2022-04-30 | Disposition: A | Discharge status: Home / Self Care | Payer: Medicare Other | Source: Ambulatory Visit | Attending: Internal Medicine | Admitting: Internal Medicine

## 2022-04-30 DIAGNOSIS — N281 Cyst of kidney, acquired: Secondary | ICD-10-CM | POA: Diagnosis not present

## 2022-04-30 DIAGNOSIS — N1831 Chronic kidney disease, stage 3a: Secondary | ICD-10-CM | POA: Insufficient documentation

## 2022-05-04 ENCOUNTER — Telehealth: Payer: Self-pay

## 2022-05-04 ENCOUNTER — Ambulatory Visit: Payer: Medicare Other | Attending: Pain Medicine | Admitting: Pain Medicine

## 2022-05-04 ENCOUNTER — Encounter: Payer: Self-pay | Admitting: Pain Medicine

## 2022-05-04 VITALS — BP 162/90 | HR 45 | Temp 97.7°F | Resp 16 | Ht 68.0 in | Wt 254.0 lb

## 2022-05-04 DIAGNOSIS — M5410 Radiculopathy, site unspecified: Secondary | ICD-10-CM

## 2022-05-04 DIAGNOSIS — M5416 Radiculopathy, lumbar region: Secondary | ICD-10-CM | POA: Diagnosis not present

## 2022-05-04 DIAGNOSIS — M79605 Pain in left leg: Secondary | ICD-10-CM

## 2022-05-04 DIAGNOSIS — M4310 Spondylolisthesis, site unspecified: Secondary | ICD-10-CM | POA: Diagnosis not present

## 2022-05-04 DIAGNOSIS — G8929 Other chronic pain: Secondary | ICD-10-CM | POA: Diagnosis not present

## 2022-05-04 DIAGNOSIS — M5417 Radiculopathy, lumbosacral region: Secondary | ICD-10-CM

## 2022-05-04 DIAGNOSIS — R200 Anesthesia of skin: Secondary | ICD-10-CM

## 2022-05-04 DIAGNOSIS — R202 Paresthesia of skin: Secondary | ICD-10-CM

## 2022-05-04 DIAGNOSIS — M5442 Lumbago with sciatica, left side: Secondary | ICD-10-CM | POA: Diagnosis not present

## 2022-05-04 DIAGNOSIS — M5137 Other intervertebral disc degeneration, lumbosacral region: Secondary | ICD-10-CM | POA: Diagnosis not present

## 2022-05-04 NOTE — Progress Notes (Signed)
PROVIDER NOTE: Information contained herein reflects review and annotations entered in association with encounter. Interpretation of such information and data should be left to medically-trained personnel. Information provided to patient can be located elsewhere in the medical record under "Patient Instructions". Document created using STT-dictation technology, any transcriptional errors that may result from process are unintentional.    Patient: Hector [name]  Service Category: E/M  Provider: Gaspar Cola, MD  DOB: May 27, 1935  DOS: 05/04/2022  Referring Provider: Einar Pheasant, MD  MRN: [id number]  Specialty: Interventional Pain Management  PCP: Einar Pheasant, MD  Type: Established Patient  Setting: Ambulatory outpatient    Location: Office  Delivery: Face-to-face     HPI  [name], a 87 y.o. year old male, is here today because of his Chronic bilateral low back pain with left-sided sciatica [M54.42, G89.29]. Hector [name] primary complain today is Back Pain (lower) Last encounter: My last encounter with him was on 04/20/2022. Pertinent problems: Hector [name] has History of prostate cancer; Shoulder pain, left; Chronic hip pain (Left); Chronic low back pain (1ry area of Pain) (Bilateral) (L>R) w/ sciatica (Left); Chronic pain syndrome; Polyarthritis, unspecified (Secondary Area of Pain); L5-S1 bilateral pars defect with spondylolisthesis; Lumbosacral Grade 1 Anterolisthesis of L5 over S1; Lumbar facet arthropathy (Bilateral); Lumbar foraminal stenosis (L5-S1) (Bilateral) (L>R); Chronic Lumbar radiculitis (L5) (Left); Chronic musculoskeletal pain; Lumbar facet syndrome (Bilateral) (L>R); Chronic lower extremity pain (2ry area of Pain) (Left); Chronic lower extremity radicular pain (L5) (Left); Lumbar facet osteoarthritis (Bilateral); Osteoarthritis of lumbar spine; Lumbar spondylosis; Spondylosis without myelopathy or radiculopathy, lumbosacral region; Chronic right shoulder pain; DDD (degenerative  disc disease), lumbosacral; Gout; Chronic low back pain (Bilateral) w/o sciatica; Neurogenic pain; Abnormal MRI, lumbar spine (08/22/2021); Numbness and tingling of lower extremity (Right); and Lumbosacral radiculopathy at S1 (Right) on their pertinent problem list. Pain Assessment: Severity of Chronic pain is reported as a 0-No pain/10. Location: Leg Right, Upper, Lower/no pain, but numbness in left leg. Onset: More than a month ago. Quality: Numbness. Timing: Intermittent. Modifying factor(s): Prednisone. Vitals:  height is '5\' 8"'  (1.727 m) and weight is 254 lb (115.2 kg). His temporal temperature is 97.7 F (36.5 C). His blood pressure is 162/90 (abnormal) and his pulse is 45 (abnormal). His respiration is 16 and oxygen saturation is 99%.   Reason for encounter: post-procedure evaluation and assessment.  The patient indicates having attained a 90% ongoing improvement in his lower extremity pain.  He refers that he still having numbness in his right foot and for this reason today I have recommended that we consider doing a second injection.  He understood and accepted.  Post-procedure evaluation   Procedure: Caudal Epidural Steroid Injection #1  Laterality: Midline aiming right Level: Sacrococcygeal ligament  Imaging: Fluoroscopy-guided         Anesthesia: Local anesthesia (1-2% Lidocaine) Anxiolysis: IV Versed 1.5 mg Sedation: No Sedation                       DOS: 04/20/2022  Performed by: Gaspar Cola, MD  Purpose: Diagnostic/Therapeutic Indications: Low back and lower extremity pain severe enough to impact quality of life or function. Rationale (medical necessity): procedure needed and proper for the diagnosis and/or treatment of Hector [name]'s medical symptoms and needs. 1. DDD (degenerative disc disease), lumbosacral   2. Lumbar foraminal stenosis (L5-S1) (Bilateral) (L>R)   3. Lumbosacral Grade 1 Anterolisthesis of L5 over S1   4. Osteoarthritis of lumbar spine   5.  L5-S1  bilateral pars defect with spondylolisthesis   6. Chronic Lumbar radiculitis (L5) (Left)   7. Chronic lower extremity radicular pain (L5) (Left)   8. Chronic lower extremity pain (2ry area of Pain) (Left)   9. Chronic low back pain (1ry area of Pain) (Bilateral) (L>R) w/ sciatica (Left)   10. Abnormal MRI, lumbar spine (08/22/2021)    NAS-11 Pain score:   Pre-procedure: 5 /10   Post-procedure: 5 /10      Effectiveness:  Initial hour after procedure: 100 %. Subsequent 4-6 hours post-procedure: 100 %. Analgesia past initial 6 hours: 90 %. Ongoing improvement:  Analgesic: Patient indicates having attained an ongoing 90% improvement on the right lower extremity pain however, he is still having numbness that goes to the bottom of the foot and what appears to be an S1 dermatomal distribution.  For this reason I have recommended to proceed with a second caudal epidural steroid injection to see if we can get this better. Function: Hector [name] reports improvement in function ROM: Hector [name] reports improvement in ROM  Pharmacotherapy Assessment  Analgesic: Tramadol 50 mg, 1 tab PO q 8 hrs (150 mg/day of tramadol) MME/day: 15 mg/day.   Monitoring: [place] PMP: PDMP not reviewed this encounter.       Pharmacotherapy: No side-effects or adverse reactions reported. Compliance: No problems identified. Effectiveness: Clinically acceptable.  No notes on file  No results found for: "CBDTHCR" No results found for: "D8THCCBX" No results found for: "D9THCCBX"  UDS:  Summary  Date Value Ref Range Status  01/27/2022 Note  Final    Comment:    ==================================================================== [employer] [facility] (MW) ==================================================================== Test                             Result       Flag       Units  Drug Present and Declared for Prescription Verification   Tramadol                       >1767        EXPECTED   ng/mg creat    O-Desmethyltramadol            745          EXPECTED   ng/mg creat   N-Desmethyltramadol            >1767        EXPECTED   ng/mg creat    Source of tramadol is a prescription medication. O-desmethyltramadol    and N-desmethyltramadol are expected metabolites of tramadol.  ==================================================================== Test                      Result    Flag   Units      Ref Range   Creatinine              283              mg/dL      >=20 ==================================================================== Declared Medications:  The flagging and interpretation on this report are based on the  following declared medications.  Unexpected results may arise from  inaccuracies in the declared medications.   **Note: The testing scope of this panel includes these medications:   Tramadol (Ultram)   **Note: The testing scope of this panel does not include the  following reported medications:   Acetaminophen (Tylenol)  Allopurinol (Zyloprim)  Amlodipine (Norvasc)  Aspirin  Calcium  Fluticasone (Flonase)  Hydralazine (Apresoline)  Irbesartan (Avapro)  Levothyroxine (Synthroid)  Lysine  Omeprazole (Prilosec)  Rosuvastatin (Crestor)  Sennosides (Senokot)  Valacyclovir (Valtrex)  Vitamin D ==================================================================== For clinical consultation, please call [phone]. ====================================================================       ROS  Constitutional: Denies any fever or chills Gastrointestinal: No reported hemesis, hematochezia, vomiting, or acute GI distress Musculoskeletal: Denies any acute onset joint swelling, redness, loss of ROM, or weakness Neurological: No reported episodes of acute onset apraxia, aphasia, dysarthria, agnosia, amnesia, paralysis, loss of coordination, or loss of consciousness  Medication Review  Lysine, Vitamin D, acetaminophen, allopurinol, amLODipine, aspirin, calcium  carbonate, fluticasone, hydrALAZINE, irbesartan, levothyroxine, omeprazole, rosuvastatin, senna, traMADol, and valACYclovir  History Review  Allergy: Hector [name] is allergic to tape. Drug: Hector [name]  reports no history of drug use. Alcohol:  reports no history of alcohol use. Tobacco:  reports that he has never smoked. He has never used smokeless tobacco. Social: Hector [name]  reports that he has never smoked. He has never used smokeless tobacco. He reports that he does not drink alcohol and does not use drugs. Medical:  has a past medical history of Acute postoperative pain (03/21/2018), Anemia, Arthritis, Barrett esophagus, Cancer ([place]), Chicken pox, Diverticulitis, Dysrhythmia, GERD (gastroesophageal reflux disease), Hyperlipidemia, Hypertension, Hypothyroidism, Melanoma ([place]), Sleep apnea, and Ulcer. Surgical: Mr. Kohles  has a past surgical history that includes Prostate surgery; cystocopy (2003); Hemorrhoid surgery; Cardiac catheterization; sleep study; Colon surgery (2006-2008-2011); Cataract Surgery (Right, 02/13/14); Esophagogastroduodenoscopy (egd) with propofol (N/A, 08/04/2016); Colonoscopy w/ polypectomy; Colonoscopy with propofol (N/A, 02/15/2018); Colonoscopy with propofol (N/A, 03/24/2020); Esophagogastroduodenoscopy (egd) with propofol (N/A, 03/24/2020); and Cystoscopy/retrograde/ureteroscopy/stone extraction with basket (Right, 10/28/2020). Family: family history includes Breast cancer in his daughter; Heart disease in his father; Kidney disease in his father; Liver disease in his mother.  Laboratory Chemistry Profile   Renal Lab Results  Component Value Date   BUN 19 04/06/2022   CREATININE 1.59 (H) 04/06/2022   BCR 10 06/13/2017   GFR 39.53 (L) 04/06/2022   GFRAA >60 01/23/2018   GFRNONAA 38 (L) 10/18/2020    Hepatic Lab Results  Component Value Date   AST 37 03/02/2022   ALT 34 03/02/2022   ALBUMIN 4.1 03/02/2022   ALKPHOS 72 03/02/2022   LIPASE 27 10/18/2020    Electrolytes Lab  Results  Component Value Date   NA 140 04/06/2022   K 3.8 04/06/2022   CL 105 04/06/2022   CALCIUM 9.5 04/06/2022   MG 2.0 06/13/2017    Bone Lab Results  Component Value Date   VD25OH 33.78 04/06/2022   25OHVITD1 20 (L) 06/13/2017   25OHVITD2 <1.0 06/13/2017   25OHVITD3 20 06/13/2017    Inflammation (CRP: Acute Phase) (ESR: Chronic Phase) Lab Results  Component Value Date   CRP 2.8 06/13/2017   ESRSEDRATE 13 06/13/2017         Note: Above Lab results reviewed.  Recent Imaging Review  US Renal CLINICAL DATA:  Stage III renal disease  EXAM: RENAL / URINARY TRACT ULTRASOUND COMPLETE  COMPARISON:  None Available.  FINDINGS: Right Kidney:  Renal measurements: 12.1 x 5.2 x 5.0 cm = volume: 165 mL. Contains a 1.7 cm cyst. No follow-up imaging recommended of the cysts.  Left Kidney:  Renal measurements: 10.8 x 4.9 x 4.4 cm = volume: 123 mL. Echogenicity within normal limits. No mass or hydronephrosis visualized.  Bladder:  Appears normal for degree of bladder distention.  Other:  None.  IMPRESSION: No significant abnormalities.  Electronically Signed   By: Dorise Bullion III M.D.   On: 05/03/2022 13:20 Note: Reviewed        Physical Exam  General appearance: Well nourished, well developed, and well hydrated. In no apparent acute distress Mental status: Alert, oriented x 3 (person, place, & time)       Respiratory: No evidence of acute respiratory distress Eyes: PERLA Vitals: BP (!) 162/90   Pulse (!) 45   Temp 97.7 F (36.5 C) (Temporal)   Resp 16   Ht '5\' 8"'  (1.727 m)   Wt 254 lb (115.2 kg)   SpO2 99%   BMI 38.62 kg/m  BMI: Estimated body mass index is 38.62 kg/m as calculated from the following:   Height as of this encounter: '5\' 8"'  (1.727 m).   Weight as of this encounter: 254 lb (115.2 kg). Ideal: Ideal body weight: 68.4 kg (150 lb 12.7 oz) Adjusted ideal body weight: 87.1 kg (192 lb 1.2 oz)  Assessment   Diagnosis Status  1. Chronic  low back pain (1ry area of Pain) (Bilateral) (L>R) w/ sciatica (Left)   2. Chronic lower extremity pain (2ry area of Pain) (Left)   3. Chronic lower extremity radicular pain (L5) (Left)   4. Chronic Lumbar radiculitis (L5) (Left)   5. L5-S1 bilateral pars defect with spondylolisthesis   6. Lumbosacral Grade 1 Anterolisthesis of L5 over S1   7. Numbness and tingling of lower extremity (Right)   8. Lumbosacral radiculopathy at S1 (Right)   9. DDD (degenerative disc disease), lumbosacral    Controlled Controlled Controlled   Updated Problems: Problem  Numbness and tingling of lower extremity (Right)  Lumbosacral radiculopathy at S1 (Right)     Plan of Care  Problem-specific:  No problem-specific Assessment & Plan notes found for this encounter.  Hector [name] has a current medication list which includes the following long-term medication(s): allopurinol, amlodipine, calcium carbonate, fluticasone, hydralazine, irbesartan, levothyroxine, omeprazole, rosuvastatin, and tramadol.  Pharmacotherapy (Medications Ordered): No orders of the defined types were placed in this encounter.  Orders:  Orders Placed This Encounter  Procedures   Caudal Epidural Injection    Standing Status:   Future    Standing Expiration Date:   08/04/2022    Scheduling Instructions:     Laterality: Right-sided     Level(s): Sacrococcygeal canal (Tailbone area)     Sedation: Patient's choice     Scheduling Timeframe: As soon as pre-approved    Order Specific Question:   Where will this procedure be performed?    Answer:   [facility]   Follow-up plan:   Return for procedure (Clinic): (R) Caudal #2.     Interventional Therapies  Risk  Complexity Considerations:   Estimated body mass index is 39.53 kg/m as calculated from the following:   Height as of this encounter: '5\' 8"'  (1.727 m).   Weight as of this encounter: 260 lb (117.9 kg). POOR Candidate for any more RFA due to movement and body  habitus.   Planned  Pending:   Therapeutic right caudal ESI #1 (04/20/2022)    Under consideration:   Diagnostic/therapeutic bilateral L5 TFESI #1    Completed:   Diagnostic midline-left caudal ESI x3 (08/27/2021) (100/100/90/90)  Palliative right lumbar facet MBB (L2-S1) x3 (08/07/2020) (100/100/100/100)  Palliative left lumbar facet MBB (L2-S1) x2 (10/27/2017) (100/100/95/75-100)  Palliative right lumbar facet RFA (L2-S1) x1 (04/18/2018) (100/100/100/100)  Palliative left lumbar facet RFA (L2-S1) x1 (03/21/2018) (75/75/80/80)    Completed by other  providers:   None at this time   Therapeutic  Palliative (PRN) options:   Diagnostic midline caudal ESI #3  Palliative right lumbar facet block #4  Palliative left lumbar facet block #3  Palliative right lumbar facet RFA #2  Palliative left lumbar facet RFA #2     Recent Visits Date Type Provider Dept  04/20/22 Procedure visit Milinda Pointer, MD [facility]  04/14/22 Office Visit Milinda Pointer, MD [facility]  Showing recent visits within past 90 days and meeting all other requirements Today's Visits Date Type Provider Dept  05/04/22 Office Visit Milinda Pointer, MD [facility]  Showing today's visits and meeting all other requirements Future Appointments No visits were found meeting these conditions. Showing future appointments within next 90 days and meeting all other requirements  I discussed the assessment and treatment plan with the patient. The patient was provided an opportunity to ask questions and all were answered. The patient agreed with the plan and demonstrated an understanding of the instructions.  Patient advised to call back or seek an in-person evaluation if the symptoms or condition worsens.  Duration of encounter: 30 minutes.  Total time on encounter, as per AMA guidelines included both the face-to-face and non-face-to-face time personally spent by the physician and/or  other qualified health care professional(s) on the day of the encounter (includes time in activities that require the physician or other qualified health care professional and does not include time in activities normally performed by clinical staff). Physician's time may include the following activities when performed: preparing to see the patient (eg, review of tests, pre-charting review of records) obtaining and/or reviewing separately obtained history performing a medically appropriate examination and/or evaluation counseling and educating the patient/family/caregiver ordering medications, tests, or procedures referring and communicating with other health care professionals (when not separately reported) documenting clinical information in the electronic or other health record independently interpreting results (not separately reported) and communicating results to the patient/ family/caregiver care coordination (not separately reported)  Note by: Gaspar Cola, MD Date: 05/04/2022; Time: 3:06 PM

## 2022-05-04 NOTE — Telephone Encounter (Signed)
LMTCB for [place] results.

## 2022-05-04 NOTE — Telephone Encounter (Signed)
LMTCB regarding ultrasound results 

## 2022-05-04 NOTE — Telephone Encounter (Signed)
-----   Message from Einar Pheasant, MD sent at 05/04/2022  2:23 AM EDT ----- Notify - renal ultrasound is ok.  Small cyst right kidney - but no acute abnormality.

## 2022-05-04 NOTE — Patient Instructions (Addendum)
______________________________________________________________________  Preparing for your procedure (without sedation)  Procedure appointments are limited to planned procedures: No Prescription Refills. No disability issues will be discussed. No medication changes will be discussed.  Instructions: Food Intake: Avoid eating anything for at least 4 hours prior to your procedure. Transportation: Unless otherwise stated by your physician, bring a driver. Morning Medicines: Take all of your scheduled morning medications. If you take heart medicine, except for blood thinners, do not forget to take it the morning of the procedure. If your Diastolic (lower reading) is above 100 mmHg, elective cases will be cancelled/rescheduled. Blood thinners: These will need to be stopped for procedures. Notify our staff if you are taking any blood thinners. Depending on which one you take, there will be specific instructions on how and when to stop it. Diabetics on insulin: Notify the staff so that you can be scheduled 1st case in the morning. If your diabetes requires high dose insulin, take only  of your normal insulin dose the morning of the procedure and notify the staff that you have done so. Preventing infections: Shower with an antibacterial soap the morning of your procedure.  Build-up your immune system: Take 1000 mg of Vitamin C with every meal (3 times a day) the day prior to your procedure. Antibiotics: Inform the staff if you have a condition or reason that requires you to take antibiotics before dental procedures. Pregnancy: If you are pregnant, call and cancel the procedure. Sickness: If you have a cold, fever, or any active infections, call and cancel the procedure. Arrival: You must be in the facility at least 30 minutes prior to your scheduled procedure. Children: Do not bring any children with you. Dress appropriately: There is always a possibility that your clothing may get soiled. Valuables:  Do not bring any jewelry or valuables.  Reasons to call and reschedule or cancel your procedure: (Following these recommendations will minimize the risk of a serious complication.) Surgeries: Avoid having procedures within 2 weeks of any surgery. (Avoid for 2 weeks before or after any surgery). Flu Shots: Avoid having procedures within 2 weeks of a flu shots or . (Avoid for 2 weeks before or after immunizations). Barium: Avoid having a procedure within 7-10 days after having had a radiological study involving the use of radiological contrast. (Myelograms, Barium swallow or enema study). Heart attacks: Avoid any elective procedures or surgeries for the initial 6 months after a "Myocardial Infarction" (Heart Attack). Blood thinners: It is imperative that you stop these medications before procedures. Let us know if you if you take any blood thinner.  Infection: Avoid procedures during or within two weeks of an infection (including chest colds or gastrointestinal problems). Symptoms associated with infections include: Localized redness, fever, chills, night sweats or profuse sweating, burning sensation when voiding, cough, congestion, stuffiness, runny nose, sore throat, diarrhea, nausea, vomiting, cold or Flu symptoms, recent or current infections. It is specially important if the infection is over the area that we intend to treat. Heart and lung problems: Symptoms that may suggest an active cardiopulmonary problem include: cough, chest pain, breathing difficulties or shortness of breath, dizziness, ankle swelling, uncontrolled high or unusually low blood pressure, and/or palpitations. If you are experiencing any of these symptoms, cancel your procedure and contact your primary care physician for an evaluation.  Remember:  Regular Business hours are:  Monday to Thursday 8:00 AM to 4:00 PM  Provider's Schedule: Milinda Pointer, MD:  Procedure days: Tuesday and Thursday 7:30 AM to 4:00 PM  Gillis Santa, MD:  Procedure days: Monday and Wednesday 7:30 AM to 4:00 PM ______________________________________________________________________ ______________________________________________________________________  Preparing for Procedure with Sedation  NOTICE: Due to recent regulatory changes, starting on March 02, 2021, procedures requiring intravenous (IV) sedation will no longer be performed at the [facility].  These types of procedures are required to be performed at [facility] ambulatory surgery facility.  We are very sorry for the inconvenience.  Procedure appointments are limited to planned procedures: No Prescription Refills. No disability issues will be discussed. No medication changes will be discussed.  Instructions: Oral Intake: Do not eat or drink anything for at least 8 hours prior to your procedure. (Exception: Blood Pressure Medication. See below.) Transportation: A driver is required. You may not drive yourself after the procedure. Blood Pressure Medicine: Do not forget to take your blood pressure medicine with a sip of water the morning of the procedure. If your Diastolic (lower reading) is above 100 mmHg, elective cases will be cancelled/rescheduled. Blood thinners: These will need to be stopped for procedures. Notify our staff if you are taking any blood thinners. Depending on which one you take, there will be specific instructions on how and when to stop it. Diabetics on insulin: Notify the staff so that you can be scheduled 1st case in the morning. If your diabetes requires high dose insulin, take only  of your normal insulin dose the morning of the procedure and notify the staff that you have done so. Preventing infections: Shower with an antibacterial soap the morning of your procedure. Build-up your immune system: Take 1000 mg of Vitamin C with every meal (3 times a day) the day prior to your procedure. Antibiotics: Inform the staff if you have a condition or reason  that requires you to take antibiotics before dental procedures. Pregnancy: If you are pregnant, call and cancel the procedure. Sickness: If you have a cold, fever, or any active infections, call and cancel the procedure. Arrival: You must be in the facility at least 30 minutes prior to your scheduled procedure. Children: Do not bring children with you. Dress appropriately: There is always the possibility that your clothing may get soiled. Valuables: Do not bring any jewelry or valuables.  Reasons to call and reschedule or cancel your procedure: (Following these recommendations will minimize the risk of a serious complication.) Surgeries: Avoid having procedures within 2 weeks of any surgery. (Avoid for 2 weeks before or after any surgery). Flu Shots: Avoid having procedures within 2 weeks of a flu shots. (Avoid for 2 weeks before or after immunizations). Barium: Avoid having a procedure within 7-10 days after having had a radiological study involving the use of radiological contrast. (Myelograms, Barium swallow or enema study). Heart attacks: Avoid any elective procedures or surgeries for the initial 6 months after a "Myocardial Infarction" (Heart Attack). Blood thinners: It is imperative that you stop these medications before procedures. Let us know if you if you take any blood thinner.  Infection: Avoid procedures during or within two weeks of an infection (including chest colds or gastrointestinal problems). Symptoms associated with infections include: Localized redness, fever, chills, night sweats or profuse sweating, burning sensation when voiding, cough, congestion, stuffiness, runny nose, sore throat, diarrhea, nausea, vomiting, cold or Flu symptoms, recent or current infections. It is specially important if the infection is over the area that we intend to treat. Heart and lung problems: Symptoms that may suggest an active cardiopulmonary problem include: cough, chest pain, breathing  difficulties or shortness of  breath, dizziness, ankle swelling, uncontrolled high or unusually low blood pressure, and/or palpitations. If you are experiencing any of these symptoms, cancel your procedure and contact your primary care physician for an evaluation.  Remember:  Regular Business hours are:  Monday to Thursday 8:00 AM to 4:00 PM  Provider's Schedule: Milinda Pointer, MD:  Procedure days: Tuesday and Thursday 7:30 AM to 4:00 PM  Gillis Santa, MD:  Procedure days: Monday and Wednesday 7:30 AM to 4:00 PM ______________________________________________________________________  Epidural Steroid Injection  An epidural steroid injection is a shot of steroid medicine, also called cortisone, and a numbing medicine that is given into the epidural space. This space is between the spinal cord and the bones of the back. This shot helps relieve pain caused by an irritated or swollen nerve root. The amount of pain relief you get from the injection depends on what is causing the nerve to be swollen and irritated, and how long your pain lasts. You may have a period of slightly more pain after your injection, before the steroid medicine takes effect. This medicine usually starts working within 1-3 days. In some cases, you might need 7-10 days to feel the full effect. Tell your health care provider about: Any allergies you have. All medicines you are taking, including vitamins, herbs, eye drops, creams, and over-the-counter medicines. Any problems you or family members have had with anesthetic medicines. Any bleeding problems you have. Any surgeries you have had. Any medical conditions you have. Whether you are pregnant or may be pregnant. What are the risks? Your health care provider will talk with you about risks. These may include: Headache. Bleeding. Infection. Allergic reaction to medicines or dyes. Nerve damage. Not being able to move (paralysis). This is rare. What happens before  the procedure? Medicines You may be given medicines to lower anxiety. Ask your health care provider about: Changing or stopping your regular medicines. These include any diabetes medicines or blood thinners you take. Taking medicines such as aspirin and ibuprofen. These medicines can thin your blood. Do not take them unless your health care provider tells you to. Taking over-the-counter medicines, vitamins, herbs, and supplements. General instructions Follow instructions from your health care provider about what you may eat and drink. Ask your health care provider what steps will be taken to help prevent infection. If you will be going home right after the procedure, plan to have a responsible adult: Take you home from the hospital or clinic. You will not be allowed to drive. Care for you for the time you are told. What happens during the procedure?  An IV will be inserted into one of your veins. You may be given a sedative. This helps you relax. You will be asked to lie on your side or sit. The injection site will be cleaned. An X-ray machine will be used to guide the needle as close as possible to the nerve causing pain. A needle will be put through your skin into the epidural space. This may cause you some discomfort. Contrast dye may be injected at the site to make sure that the steroid medicine will be sent to the exact place it needs to go. The steroid medicine and a numbing medicine (local anesthesia) will be injected into the epidural space for pain relief. The needle and IV will be removed. A bandage (dressing) will be put over the injection site. The procedure may vary among health care providers and hospitals. What happens after the procedure? Your blood pressure, heart rate,  breathing rate, and blood oxygen level will be monitored until you leave the hospital or clinic. Your arm or leg may feel weak or numb for a few hours. Summary An epidural steroid injection is a shot of  steroid medicine and a numbing medicine that is given into the epidural space. The shot helps relieve pain caused by an irritated or swollen nerve root. The steroid medicine usually starts working within 1-3 days. In some cases, you might need 7-10 days to feel the full effect. This information is not intended to replace advice given to you by your health care provider. Make sure you discuss any questions you have with your health care provider. Document Revised: 11/10/2021 Document Reviewed: 11/10/2021 Elsevier Patient Education  [place].

## 2022-05-08 ENCOUNTER — Other Ambulatory Visit: Payer: Self-pay | Admitting: Internal Medicine

## 2022-05-11 ENCOUNTER — Ambulatory Visit: Payer: Medicare Other | Admitting: Pain Medicine

## 2022-05-13 ENCOUNTER — Ambulatory Visit: Payer: Medicare Other | Attending: Pain Medicine | Admitting: Pain Medicine

## 2022-05-13 ENCOUNTER — Encounter: Payer: Self-pay | Admitting: Pain Medicine

## 2022-05-13 ENCOUNTER — Ambulatory Visit
Admission: RE | Admit: 2022-05-13 | Discharge: 2022-05-13 | Disposition: A | Discharge status: Home / Self Care | Payer: Medicare Other | Source: Ambulatory Visit | Attending: Pain Medicine | Admitting: Pain Medicine

## 2022-05-13 VITALS — BP 154/101 | HR 98 | Temp 97.4°F | Resp 17 | Ht 68.0 in | Wt 254.0 lb

## 2022-05-13 DIAGNOSIS — M5137 Other intervertebral disc degeneration, lumbosacral region: Secondary | ICD-10-CM | POA: Insufficient documentation

## 2022-05-13 DIAGNOSIS — R200 Anesthesia of skin: Secondary | ICD-10-CM | POA: Diagnosis not present

## 2022-05-13 DIAGNOSIS — M79604 Pain in right leg: Secondary | ICD-10-CM | POA: Diagnosis not present

## 2022-05-13 DIAGNOSIS — M5416 Radiculopathy, lumbar region: Secondary | ICD-10-CM | POA: Diagnosis not present

## 2022-05-13 DIAGNOSIS — M79605 Pain in left leg: Secondary | ICD-10-CM | POA: Insufficient documentation

## 2022-05-13 DIAGNOSIS — G8929 Other chronic pain: Secondary | ICD-10-CM | POA: Diagnosis not present

## 2022-05-13 DIAGNOSIS — M4310 Spondylolisthesis, site unspecified: Secondary | ICD-10-CM | POA: Diagnosis not present

## 2022-05-13 DIAGNOSIS — M5441 Lumbago with sciatica, right side: Secondary | ICD-10-CM | POA: Insufficient documentation

## 2022-05-13 DIAGNOSIS — M5410 Radiculopathy, site unspecified: Secondary | ICD-10-CM | POA: Insufficient documentation

## 2022-05-13 DIAGNOSIS — M5417 Radiculopathy, lumbosacral region: Secondary | ICD-10-CM

## 2022-05-13 DIAGNOSIS — M5442 Lumbago with sciatica, left side: Secondary | ICD-10-CM | POA: Insufficient documentation

## 2022-05-13 DIAGNOSIS — R202 Paresthesia of skin: Secondary | ICD-10-CM | POA: Insufficient documentation

## 2022-05-13 DIAGNOSIS — M51379 Other intervertebral disc degeneration, lumbosacral region without mention of lumbar back pain or lower extremity pain: Secondary | ICD-10-CM

## 2022-05-13 MED ORDER — MIDAZOLAM HCL 5 MG/5ML IJ SOLN
INTRAMUSCULAR | Status: AC
  Filled 2022-05-13: qty 5

## 2022-05-13 MED ORDER — MIDAZOLAM HCL 5 MG/5ML IJ SOLN
0.5000 mg | Freq: Once | INTRAMUSCULAR | Status: AC
  Administered 2022-05-13: 1.5 mg via INTRAVENOUS

## 2022-05-13 MED ORDER — IOHEXOL 180 MG/ML  SOLN
INTRAMUSCULAR | Status: AC
  Filled 2022-05-13: qty 20

## 2022-05-13 MED ORDER — LIDOCAINE HCL 2 % IJ SOLN
INTRAMUSCULAR | Status: AC
  Filled 2022-05-13: qty 20

## 2022-05-13 MED ORDER — TRIAMCINOLONE ACETONIDE 40 MG/ML IJ SUSP
40.0000 mg | Freq: Once | INTRAMUSCULAR | Status: AC
  Administered 2022-05-13: 40 mg

## 2022-05-13 MED ORDER — SODIUM CHLORIDE 0.9% FLUSH
2.0000 mL | Freq: Once | INTRAVENOUS | Status: AC
  Administered 2022-05-13: 2 mL

## 2022-05-13 MED ORDER — ROPIVACAINE HCL 2 MG/ML IJ SOLN
2.0000 mL | Freq: Once | INTRAMUSCULAR | Status: AC
  Administered 2022-05-13: 2 mL via EPIDURAL

## 2022-05-13 MED ORDER — PENTAFLUOROPROP-TETRAFLUOROETH EX AERO: INHALATION_SPRAY | Freq: Once | CUTANEOUS | Status: DC

## 2022-05-13 MED ORDER — LIDOCAINE HCL 2 % IJ SOLN
20.0000 mL | Freq: Once | INTRAMUSCULAR | Status: AC
  Administered 2022-05-13: 400 mg

## 2022-05-13 MED ORDER — ROPIVACAINE HCL 2 MG/ML IJ SOLN
INTRAMUSCULAR | Status: AC
  Filled 2022-05-13: qty 20

## 2022-05-13 MED ORDER — TRIAMCINOLONE ACETONIDE 40 MG/ML IJ SUSP
INTRAMUSCULAR | Status: AC
  Filled 2022-05-13: qty 1

## 2022-05-13 MED ORDER — IOHEXOL 180 MG/ML  SOLN
10.0000 mL | Freq: Once | INTRAMUSCULAR | Status: AC
  Administered 2022-05-13: 10 mL via EPIDURAL

## 2022-05-13 MED ORDER — SODIUM CHLORIDE (PF) 0.9 % IJ SOLN
INTRAMUSCULAR | Status: AC
  Filled 2022-05-13: qty 10

## 2022-05-13 MED ORDER — LACTATED RINGERS IV SOLN: Freq: Once | INTRAVENOUS | Status: AC

## 2022-05-13 NOTE — Progress Notes (Signed)
PROVIDER NOTE: Interpretation of information contained herein should be left to medically-trained personnel. Specific patient instructions are provided elsewhere under "Patient Instructions" section of medical record. This document was created in part using STT-dictation technology, any transcriptional errors that may result from this process are unintentional.  Patient: Hector [name] Type: Established DOB: 06/12/1935 MRN: [id number] PCP: Einar Pheasant, MD  Service: Procedure DOS: 05/13/2022 Setting: Ambulatory Location: Ambulatory outpatient facility Delivery: Face-to-face Provider: Gaspar Cola, MD Specialty: Interventional Pain Management Specialty designation: 09 Location: Outpatient facility Ref. Prov.: Milinda Pointer, MD    Interventional Therapy      Procedure: Caudal Epidural Steroid Injection #2  Laterality: Midline aiming right Level: Sacrococcygeal ligament  Imaging: Fluoroscopy-guided         Anesthesia: Local anesthesia (1-2% Lidocaine) Anxiolysis: IV Versed         Sedation: No Sedation                       DOS: 05/13/2022  Performed by: Gaspar Cola, MD  Purpose: Diagnostic/Therapeutic Indications: Low back and lower extremity pain severe enough to impact quality of life or function. Rationale (medical necessity): procedure needed and proper for the diagnosis and/or treatment of Hector [name]'s medical symptoms and needs. 1. Lumbosacral radiculopathy at S1 (Right)   2. Chronic lower extremity pain (Right)   3. DDD (degenerative disc disease), lumbosacral   4. L5-S1 bilateral pars defect with spondylolisthesis   5. Lumbosacral Grade 1 Anterolisthesis of L5 over S1   6. Chronic low back pain (Bilateral) w/ sciatica (Right)    NAS-11 Pain score:   Pre-procedure:  /10   Post-procedure:  /10     Target: Lumbosacral epidural canal  Location: Epidural space  Region: Caudal canal Approach: Percutaneous  Type of procedure: Epidural block  Position   Prep  Materials:  Position: Prone  Prep solution: DuraPrep (Iodine Povacrylex [0.7% available iodine] and Isopropyl Alcohol, 74% w/w) Prep Area: Entire posterior lumbosacral area  Materials:  Tray: Epidural Needle(s):  Type: Epidural  Gauge (G): 17  Length: 3.5-in  Qty: 1  Pre-op H&P Assessment:  Hector [name] is a 87 y.o. (year old), male patient, seen today for interventional treatment. He  has a past surgical history that includes Prostate surgery; cystocopy (2003); Hemorrhoid surgery; Cardiac catheterization; sleep study; Colon surgery (2006-2008-2011); Cataract Surgery (Right, 02/13/14); Esophagogastroduodenoscopy (egd) with propofol (N/A, 08/04/2016); Colonoscopy w/ polypectomy; Colonoscopy with propofol (N/A, 02/15/2018); Colonoscopy with propofol (N/A, 03/24/2020); Esophagogastroduodenoscopy (egd) with propofol (N/A, 03/24/2020); and Cystoscopy/retrograde/ureteroscopy/stone extraction with basket (Right, 10/28/2020). Hector [name] has a current medication list which includes the following prescription(s): acetaminophen, allopurinol, amlodipine, aspirin, calcium carbonate, fluticasone, hydralazine, irbesartan, levothyroxine, lysine, omeprazole, rosuvastatin, senna, tramadol, valacyclovir, and vitamin d, and the following Facility-Administered Medications: iohexol, lactated ringers, lidocaine, midazolam, pentafluoroprop-tetrafluoroeth, ropivacaine (pf) 2 mg/ml (0.2%), sodium chloride flush, and triamcinolone acetonide. His primarily concern today is the No chief complaint on file.  Initial Vital Signs:  Pulse/HCG Rate:    Temp:   Resp:   BP:   SpO2:    BMI: Estimated body mass index is 38.62 kg/m as calculated from the following:   Height as of 05/04/22: '5\' 8"'$  (1.727 m).   Weight as of 05/04/22: 254 lb (115.2 kg).  Risk Assessment: Allergies: Reviewed. He is allergic to tape.  Allergy Precautions: None required Coagulopathies: Reviewed. None identified.  Blood-thinner therapy: None at this  time Active Infection(s): Reviewed. None identified. Hector [name] is afebrile  Site Confirmation: Hector [name] was asked  to confirm the procedure and laterality before marking the site Procedure checklist: Completed Consent: Before the procedure and under the influence of no sedative(s), amnesic(s), or anxiolytics, the patient was informed of the treatment options, risks and possible complications. To fulfill our ethical and legal obligations, as recommended by the American Medical Association's Code of Ethics, I have informed the patient of my clinical impression; the nature and purpose of the treatment or procedure; the risks, benefits, and possible complications of the intervention; the alternatives, including doing nothing; the risk(s) and benefit(s) of the alternative treatment(s) or procedure(s); and the risk(s) and benefit(s) of doing nothing. The patient was provided information about the general risks and possible complications associated with the procedure. These may include, but are not limited to: failure to achieve desired goals, infection, bleeding, organ or nerve damage, allergic reactions, paralysis, and death. In addition, the patient was informed of those risks and complications associated to Spine-related procedures, such as failure to decrease pain; infection (i.e.: Meningitis, epidural or intraspinal abscess); bleeding (i.e.: epidural hematoma, subarachnoid hemorrhage, or any other type of intraspinal or peri-dural bleeding); organ or nerve damage (i.e.: Any type of peripheral nerve, nerve root, or spinal cord injury) with subsequent damage to sensory, motor, and/or autonomic systems, resulting in permanent pain, numbness, and/or weakness of one or several areas of the body; allergic reactions; (i.e.: anaphylactic reaction); and/or death. Furthermore, the patient was informed of those risks and complications associated with the medications. These include, but are not limited to: allergic  reactions (i.e.: anaphylactic or anaphylactoid reaction(s)); adrenal axis suppression; blood sugar elevation that in diabetics may result in ketoacidosis or comma; water retention that in patients with history of congestive heart failure may result in shortness of breath, pulmonary edema, and decompensation with resultant heart failure; weight gain; swelling or edema; medication-induced neural toxicity; particulate matter embolism and blood vessel occlusion with resultant organ, and/or nervous system infarction; and/or aseptic necrosis of one or more joints. Finally, the patient was informed that Medicine is not an exact science; therefore, there is also the possibility of unforeseen or unpredictable risks and/or possible complications that may result in a catastrophic outcome. The patient indicated having understood very clearly. We have given the patient no guarantees and we have made no promises. Enough time was given to the patient to ask questions, all of which were answered to the patient's satisfaction. Mr. Puebla has indicated that he wanted to continue with the procedure. Attestation: I, the ordering provider, attest that I have discussed with the patient the benefits, risks, side-effects, alternatives, likelihood of achieving goals, and potential problems during recovery for the procedure that I have provided informed consent. Date  Time: 05/13/2022 10:50 AM  Imaging Guidance (Spinal):          Type of Imaging Technique: Fluoroscopy Guidance (Spinal) Indication(s): Assistance in needle guidance and placement for procedures requiring needle placement in or near specific anatomical locations not easily accessible without such assistance. Exposure Time: Please see nurses notes. Contrast: Before injecting any contrast, we confirmed that the patient did not have an allergy to iodine, shellfish, or radiological contrast. Once satisfactory needle placement was completed at the desired level, radiological  contrast was injected. Contrast injected under live fluoroscopy. No contrast complications. See chart for type and volume of contrast used. Fluoroscopic Guidance: I was personally present during the use of fluoroscopy. "Tunnel Vision Technique" used to obtain the best possible view of the target area. Parallax error corrected before commencing the procedure. "Direction-depth-direction" technique used to  introduce the needle under continuous pulsed fluoroscopy. Once target was reached, antero-posterior, oblique, and lateral fluoroscopic projection used confirm needle placement in all planes. Images permanently stored in EMR. Interpretation: I personally interpreted the imaging intraoperatively. Adequate needle placement confirmed in multiple planes. Appropriate spread of contrast into desired area was observed. No evidence of afferent or efferent intravascular uptake. No intrathecal or subarachnoid spread observed. Permanent images saved into the patient's record.  Pre-Procedure Preparation:  Monitoring: As per clinic protocol. Respiration, ETCO2, SpO2, BP, heart rate and rhythm monitor placed and checked for adequate function Safety Precautions: Patient was assessed for positional comfort and pressure points before starting the procedure. Time-out: I initiated and conducted the "Time-out" before starting the procedure, as per protocol. The patient was asked to participate by confirming the accuracy of the "Time Out" information. Verification of the correct person, site, and procedure were performed and confirmed by me, the nursing staff, and the patient. "Time-out" conducted as per Joint Commission's Universal Protocol (UP.01.01.01). Time:    Description  Narrative of Procedure:          Procedural Technique Safety Precautions: Aspiration looking for blood return was conducted prior to all injections. At no point did we inject any substances, as a needle was being advanced. No attempts were made at  seeking any paresthesias. Safe injection practices and needle disposal techniques used. Medications properly checked for expiration dates. SDV (single dose vial) medications used. Description of the Procedure: Protocol guidelines were followed. The patient was assisted into a comfortable position. The target area was identified and the area prepped in the usual manner. Skin & deeper tissues infiltrated with local anesthetic. Appropriate amount of time allowed to pass for local anesthetics to take effect. The procedure needles were then advanced to the target area. Proper needle placement secured. Negative aspiration confirmed. Solution injected in intermittent fashion, asking for systemic symptoms every 0.5cc of injectate. The needles were then removed and the area cleansed, making sure to leave some of the prepping solution back to take advantage of its long term bactericidal properties.  Technical description of procedure:  Safety Precautions: Aspiration looking for blood return was conducted prior to all injections. At no point did we inject any substances, as a needle was being advanced. No attempts were made at seeking any paresthesias. Safe injection practices and needle disposal techniques used. Medications properly checked for expiration dates. SDV (single dose vial) medications used. Description of the Procedure: Protocol guidelines were followed. The patient was placed in position over the fluoroscopy table. The target area was identified and the area prepped in the usual manner. Skin & deeper tissues infiltrated with local anesthetic. Appropriate amount of time allowed to pass for local anesthetics to take effect. The procedure needle was then advanced to the target area. Proper needle placement secured. Negative aspiration confirmed. Solution injected in intermittent fashion, asking for systemic symptoms every 0.5cc of injectate. The needles were then removed and the area cleansed, making sure to  leave some of the prepping solution back to take advantage of its long term bactericidal properties.  There were no vitals filed for this visit.   Start Time:   hrs. End Time:   hrs.  Post-operative Assessment:  Post-procedure Vital Signs:  Pulse/HCG Rate:    Temp:   Resp:   BP:   SpO2:    EBL: None  Complications: No immediate post-treatment complications observed by team, or reported by patient.  Note: The patient tolerated the entire procedure well. A repeat set  of vitals were taken after the procedure and the patient was kept under observation following institutional policy, for this type of procedure. Post-procedural neurological assessment was performed, showing return to baseline, prior to discharge. The patient was provided with post-procedure discharge instructions, including a section on how to identify potential problems. Should any problems arise concerning this procedure, the patient was given instructions to immediately contact us, at any time, without hesitation. In any case, we plan to contact the patient by telephone for a follow-up status report regarding this interventional procedure.  Comments:  No additional relevant information.  Plan of Care  Orders:  Orders Placed This Encounter  Procedures   Caudal Epidural Injection    Scheduling Instructions:     Laterality: Right-sided     Level(s): Sacrococcygeal canal (Tailbone area)     Sedation: Patient's choice     Timeframe: Today    Order Specific Question:   Where will this procedure be performed?    Answer:   [facility]   DG [facility] C-ARM 1-60 MIN NO REPORT    Intraoperative interpretation by procedural physician at [place].    Standing Status:   Standing    Number of Occurrences:   1    Order Specific Question:   Reason for exam:    Answer:   Assistance in needle guidance and placement for procedures requiring needle placement in or near specific anatomical locations not easily  accessible without such assistance.   Informed Consent Details: Physician/Practitioner Attestation; Transcribe to consent form and obtain patient signature    Nursing Order: Transcribe to consent form and obtain patient signature. Note: Always confirm laterality of pain with Mr. Vanduyne, before procedure.    Order Specific Question:   Physician/Practitioner attestation of informed consent for procedure/surgical case    Answer:   I, the physician/practitioner, attest that I have discussed with the patient the benefits, risks, side effects, alternatives, likelihood of achieving goals and potential problems during recovery for the procedure that I have provided informed consent.    Order Specific Question:   Procedure    Answer:   Caudal epidural steroid injection    Order Specific Question:   Physician/Practitioner performing the procedure    Answer:   Nicolaos Mitrano A. Dossie Arbour, MD    Order Specific Question:   Indication/Reason    Answer:   Low back pain and lower extremity pain secondary to lumbosacral radiculitis   Provide equipment / supplies at bedside    "Epidural Tray" (Disposable  single use) Catheter: NOT required    Standing Status:   Standing    Number of Occurrences:   1    Order Specific Question:   Specify    Answer:   Epidural Tray   Chronic Opioid Analgesic:  Tramadol 50 mg, 1 tab PO q 8 hrs (150 mg/day of tramadol) MME/day: 15 mg/day.   Medications ordered for procedure: Meds ordered this encounter  Medications   iohexol (OMNIPAQUE) 180 MG/ML injection 10 mL    Must be Myelogram-compatible. If not available, you may substitute with a water-soluble, non-ionic, hypoallergenic, myelogram-compatible radiological contrast medium.   lidocaine (XYLOCAINE) 2 % (with pres) injection 400 mg   pentafluoroprop-tetrafluoroeth (GEBAUERS) aerosol   lactated ringers infusion   midazolam (VERSED) 5 MG/5ML injection 0.5-2 mg    Make sure Flumazenil is available in the pyxis when using this  medication. If oversedation occurs, administer 0.2 mg IV over 15 sec. If after 45 sec no response, administer 0.2 mg again  over 1 min; may repeat at 1 min intervals; not to exceed 4 doses (1 mg)   sodium chloride flush (NS) 0.9 % injection 2 mL   ropivacaine (PF) 2 mg/mL (0.2%) (NAROPIN) injection 2 mL   triamcinolone acetonide (KENALOG-40) injection 40 mg   Medications administered: Tamarius A. Krise "Jake" had no medications administered during this visit.  See the medical record for exact dosing, route, and time of administration.  Follow-up plan:   Return in about 2 weeks (around 05/27/2022) for Proc-day (T,Th), (F2F), (PPE).       Interventional Therapies  Risk  Complexity Considerations:   Estimated body mass index is 39.53 kg/m as calculated from the following:   Height as of this encounter: '5\' 8"'$  (1.727 m).   Weight as of this encounter: 260 lb (117.9 kg). POOR Candidate for any more RFA due to movement and body habitus.   Planned  Pending:   Therapeutic right caudal ESI #1 (04/20/2022)    Under consideration:   Diagnostic/therapeutic bilateral L5 TFESI #1    Completed:   Diagnostic midline-left caudal ESI x3 (08/27/2021) (100/100/90/90)  Palliative right lumbar facet MBB (L2-S1) x3 (08/07/2020) (100/100/100/100)  Palliative left lumbar facet MBB (L2-S1) x2 (10/27/2017) (100/100/95/75-100)  Palliative right lumbar facet RFA (L2-S1) x1 (04/18/2018) (100/100/100/100)  Palliative left lumbar facet RFA (L2-S1) x1 (03/21/2018) (75/75/80/80)    Completed by other providers:   None at this time   Therapeutic  Palliative (PRN) options:   Diagnostic midline caudal ESI #3  Palliative right lumbar facet block #4  Palliative left lumbar facet block #3  Palliative right lumbar facet RFA #2  Palliative left lumbar facet RFA #2      Recent Visits Date Type Provider Dept  05/04/22 Office Visit Milinda Pointer, MD [facility]  04/20/22 Procedure visit Milinda Pointer, MD  [facility]  04/14/22 Office Visit Milinda Pointer, MD [facility]  Showing recent visits within past 90 days and meeting all other requirements Today's Visits Date Type Provider Dept  05/13/22 Procedure visit Milinda Pointer, MD [facility]  Showing today's visits and meeting all other requirements Future Appointments Date Type Provider Dept  08/04/22 Appointment Milinda Pointer, MD [facility]  Showing future appointments within next 90 days and meeting all other requirements  Disposition: Discharge home  Discharge (Date  Time): 05/13/2022;   hrs.   Primary Care Physician: Einar Pheasant, MD Location: [facility] Note by: Gaspar Cola, MD Date: 05/13/2022; Time: 10:43 AM  Disclaimer:  Medicine is not an Chief Strategy Officer. The only guarantee in medicine is that nothing is guaranteed. It is important to note that the decision to proceed with this intervention was based on the information collected from the patient. The Data and conclusions were drawn from the patient's questionnaire, the interview, and the physical examination. Because the information was provided in large part by the patient, it cannot be guaranteed that it has not been purposely or unconsciously manipulated. Every effort has been made to obtain as much relevant data as possible for this evaluation. It is important to note that the conclusions that lead to this procedure are derived in large part from the available data. Always take into account that the treatment will also be dependent on availability of resources and existing treatment guidelines, considered by other Pain Management Practitioners as being common knowledge and practice, at the time of the intervention. For Medico-Legal purposes, it is also important to point out that variation in  procedural techniques and pharmacological choices are the acceptable norm. The indications,  contraindications, technique, and results of the above procedure should only be interpreted and judged by a Board-Certified Interventional Pain Specialist with extensive familiarity and expertise in the same exact procedure and technique.

## 2022-05-13 NOTE — Progress Notes (Signed)
Safety precautions to be maintained throughout the outpatient stay will include: orient to surroundings, keep bed in low position, maintain call bell within reach at all times, provide assistance with transfer out of bed and ambulation.  

## 2022-05-14 ENCOUNTER — Telehealth: Payer: Self-pay

## 2022-05-14 NOTE — Telephone Encounter (Signed)
Post procedure phone call.  Patinet states he is doing good

## 2022-05-26 DIAGNOSIS — H02882 Meibomian gland dysfunction right lower eyelid: Secondary | ICD-10-CM | POA: Diagnosis not present

## 2022-05-26 DIAGNOSIS — H02884 Meibomian gland dysfunction left upper eyelid: Secondary | ICD-10-CM | POA: Diagnosis not present

## 2022-05-26 DIAGNOSIS — Z961 Presence of intraocular lens: Secondary | ICD-10-CM | POA: Diagnosis not present

## 2022-05-26 DIAGNOSIS — H2512 Age-related nuclear cataract, left eye: Secondary | ICD-10-CM | POA: Diagnosis not present

## 2022-05-26 DIAGNOSIS — I10 Essential (primary) hypertension: Secondary | ICD-10-CM | POA: Diagnosis not present

## 2022-05-26 NOTE — Progress Notes (Unsigned)
PROVIDER NOTE: Information contained herein reflects review and annotations entered in association with encounter. Interpretation of such information and data should be left to medically-trained personnel. Information provided to patient can be located elsewhere in the medical record under "Patient Instructions". Document created using STT-dictation technology, any transcriptional errors that may result from process are unintentional.    Patient: Hector [name]  Service Category: E/M  Provider: Gaspar Cola, MD  DOB: [birth year]  DOS: 05/27/2022  Referring Provider: Einar Pheasant, MD  MRN: [id number]  Specialty: Interventional Pain Management  PCP: Einar Pheasant, MD  Type: Established Patient  Setting: Ambulatory outpatient    Location: Office  Delivery: Face-to-face     HPI  [name], a 87 y.o. year old male, is here today because of his No primary diagnosis found.. [name]'s primary complain today is No chief complaint on file. Last encounter: My last encounter with him was on 05/13/2022. Pertinent problems: [name] has History of prostate cancer; Shoulder pain, left; Chronic hip pain (Left); Chronic low back pain (1ry area of Pain) (Bilateral) (L>R) w/ sciatica (Left); Chronic pain syndrome; Polyarthritis, unspecified (Secondary Area of Pain); L5-S1 bilateral pars defect with spondylolisthesis; Lumbosacral Grade 1 Anterolisthesis of L5 over S1; Lumbar facet arthropathy (Bilateral); Lumbar foraminal stenosis (L5-S1) (Bilateral) (L>R); Chronic Lumbar radiculitis (L5) (Left); Chronic musculoskeletal pain; Lumbar facet syndrome (Bilateral) (L>R); Chronic lower extremity pain (2ry area of Pain) (Left); Chronic lower extremity radicular pain (L5) (Left); Lumbar facet osteoarthritis (Bilateral); Osteoarthritis of lumbar spine; Lumbar spondylosis; Spondylosis without myelopathy or radiculopathy, lumbosacral region; Chronic right shoulder pain; DDD (degenerative disc disease), lumbosacral; Gout;  Chronic low back pain (Bilateral) w/o sciatica; Neurogenic pain; Abnormal MRI, lumbar spine (08/22/2021); Numbness and tingling of lower extremity (Right); Lumbosacral radiculopathy at S1 (Right); Chronic lower extremity pain (Right); and Chronic low back pain (Bilateral) w/ sciatica (Right) on their pertinent problem list. Pain Assessment: Severity of   is reported as a  /10. Location:    / . Onset:  . Quality:  . Timing:  . Modifying factor(s):  Marland Kitchen Vitals:  vitals were not taken for this visit.   Reason for encounter: post-procedure evaluation and assessment. ***  Post-procedure evaluation   Procedure: Caudal Epidural Steroid Injection #2  Laterality: Midline aiming right Level: Sacrococcygeal ligament  Imaging: Fluoroscopy-guided         Anesthesia: Local anesthesia (1-2% Lidocaine) Anxiolysis: IV Versed         Sedation: No Sedation                       DOS: 05/13/2022  Performed by: Gaspar Cola, MD  Purpose: Diagnostic/Therapeutic Indications: Low back and lower extremity pain severe enough to impact quality of life or function. Rationale (medical necessity): procedure needed and proper for the diagnosis and/or treatment of [name]'s medical symptoms and needs. 1. Lumbosacral radiculopathy at S1 (Right)   2. Chronic lower extremity pain (Right)   3. DDD (degenerative disc disease), lumbosacral   4. L5-S1 bilateral pars defect with spondylolisthesis   5. Lumbosacral Grade 1 Anterolisthesis of L5 over S1   6. Chronic low back pain (Bilateral) w/ sciatica (Right)    NAS-11 Pain score:   Pre-procedure:  /10   Post-procedure:  /10      Effectiveness:  Initial hour after procedure:   ***. Subsequent 4-6 hours post-procedure:   ***. Analgesia past initial 6 hours:   ***. Ongoing improvement:  Analgesic:  *** Function:    ***  ROM:    ***     Pharmacotherapy Assessment  Analgesic: Tramadol 50 mg, 1 tab PO q 8 hrs (150 mg/day of tramadol) MME/day: 15 mg/day.    Monitoring: [place] PMP: PDMP reviewed during this encounter.       Pharmacotherapy: No side-effects or adverse reactions reported. Compliance: No problems identified. Effectiveness: Clinically acceptable.  No notes on file  No results found for: "CBDTHCR" No results found for: "D8THCCBX" No results found for: "D9THCCBX"  UDS:  Summary  Date Value Ref Range Status  01/27/2022 Note  Final    Comment:    ==================================================================== [employer] [facility] [employer] (MW) ==================================================================== Test                             Result       Flag       Units  Drug Present and Declared for Prescription Verification   Tramadol                       >1767        EXPECTED   ng/mg creat   O-Desmethyltramadol            745          EXPECTED   ng/mg creat   N-Desmethyltramadol            >1767        EXPECTED   ng/mg creat    Source of tramadol is a prescription medication. O-desmethyltramadol    and N-desmethyltramadol are expected metabolites of tramadol.  ==================================================================== Test                      Result    Flag   Units      Ref Range   Creatinine              283              mg/dL      >=20 ==================================================================== Declared Medications:  The flagging and interpretation on this report are based on the  following declared medications.  Unexpected results may arise from  inaccuracies in the declared medications.   **Note: The testing scope of this panel includes these medications:   Tramadol (Ultram)   **Note: The testing scope of this panel does not include the  following reported medications:   Acetaminophen (Tylenol)  Allopurinol (Zyloprim)  Amlodipine (Norvasc)  Aspirin  Calcium  Fluticasone (Flonase)  Hydralazine (Apresoline)  Irbesartan (Avapro)  Levothyroxine (Synthroid)  Lysine  Omeprazole  (Prilosec)  Rosuvastatin (Crestor)  Sennosides (Senokot)  Valacyclovir (Valtrex)  Vitamin D ==================================================================== For clinical consultation, please call [phone]. ====================================================================       ROS  Constitutional: Denies any fever or chills Gastrointestinal: No reported hemesis, hematochezia, vomiting, or acute GI distress Musculoskeletal: Denies any acute onset joint swelling, redness, loss of ROM, or weakness Neurological: No reported episodes of acute onset apraxia, aphasia, dysarthria, agnosia, amnesia, paralysis, loss of coordination, or loss of consciousness  Medication Review  Lysine, Vitamin D, acetaminophen, allopurinol, amLODipine, aspirin, calcium carbonate, fluticasone, hydrALAZINE, irbesartan, levothyroxine, omeprazole, rosuvastatin, senna, traMADol, and valACYclovir  History Review  Allergy: Mr. Berns is allergic to tape. Drug: Mr. Holzer  reports no history of drug use. Alcohol:  reports no history of alcohol use. Tobacco:  reports that he has never smoked. He has never used smokeless tobacco. Social: Mr. Whack  reports that he has  never smoked. He has never used smokeless tobacco. He reports that he does not drink alcohol and does not use drugs. Medical:  has a past medical history of Acute postoperative pain (03/21/2018), Anemia, Arthritis, Barrett esophagus, Cancer ([place]), Chicken pox, Diverticulitis, Dysrhythmia, GERD (gastroesophageal reflux disease), Hyperlipidemia, Hypertension, Hypothyroidism, Melanoma ([place]), Sleep apnea, and Ulcer. Surgical: Mr. Brenning  has a past surgical history that includes Prostate surgery; cystocopy (2003); Hemorrhoid surgery; Cardiac catheterization; sleep study; Colon surgery (2006-2008-2011); Cataract Surgery (Right, 02/13/14); Esophagogastroduodenoscopy (egd) with propofol (N/A, 08/04/2016); Colonoscopy w/ polypectomy; Colonoscopy with propofol (N/A,  02/15/2018); Colonoscopy with propofol (N/A, 03/24/2020); Esophagogastroduodenoscopy (egd) with propofol (N/A, 03/24/2020); and Cystoscopy/retrograde/ureteroscopy/stone extraction with basket (Right, 10/28/2020). Family: family history includes Breast cancer in his daughter; Heart disease in his father; Kidney disease in his father; Liver disease in his mother.  Laboratory Chemistry Profile   Renal Lab Results  Component Value Date   BUN 19 04/06/2022   CREATININE 1.59 (H) 04/06/2022   BCR 10 06/13/2017   GFR 39.53 (L) 04/06/2022   GFRAA >60 01/23/2018   GFRNONAA 38 (L) 10/18/2020    Hepatic Lab Results  Component Value Date   AST 37 03/02/2022   ALT 34 03/02/2022   ALBUMIN 4.1 03/02/2022   ALKPHOS 72 03/02/2022   LIPASE 27 10/18/2020    Electrolytes Lab Results  Component Value Date   NA 140 04/06/2022   K 3.8 04/06/2022   CL 105 04/06/2022   CALCIUM 9.5 04/06/2022   MG 2.0 06/13/2017    Bone Lab Results  Component Value Date   VD25OH 33.78 04/06/2022   25OHVITD1 20 (L) 06/13/2017   25OHVITD2 <1.0 06/13/2017   25OHVITD3 20 06/13/2017    Inflammation (CRP: Acute Phase) (ESR: Chronic Phase) Lab Results  Component Value Date   CRP 2.8 06/13/2017   ESRSEDRATE 13 06/13/2017         Note: Above Lab results reviewed.  Recent Imaging Review  DG PAIN CLINIC C-ARM 1-60 MIN NO REPORT Fluoro was used, but no Radiologist interpretation will be provided.  Please refer to "NOTES" tab for provider progress note. Note: Reviewed        Physical Exam  General appearance: Well nourished, well developed, and well hydrated. In no apparent acute distress Mental status: Alert, oriented x 3 (person, place, & time)       Respiratory: No evidence of acute respiratory distress Eyes: PERLA Vitals: There were no vitals taken for this visit. BMI: Estimated body mass index is 38.62 kg/m as calculated from the following:   Height as of 05/13/22: '5\' 8"'  (1.727 m).   Weight as of 05/13/22:  254 lb (115.2 kg). Ideal: Ideal body weight: 68.4 kg (150 lb 12.7 oz) Adjusted ideal body weight: 87.1 kg (192 lb 1.2 oz)  Assessment   Diagnosis Status  No diagnosis found. Controlled Controlled Controlled   Updated Problems: No problems updated.   Plan of Care  Problem-specific:  No problem-specific Assessment & Plan notes found for this encounter.  Mr. AYCEN PORRECA has a current medication list which includes the following long-term medication(s): allopurinol, amlodipine, calcium carbonate, fluticasone, hydralazine, irbesartan, levothyroxine, omeprazole, rosuvastatin, and tramadol.  Pharmacotherapy (Medications Ordered): No orders of the defined types were placed in this encounter.  Orders:  No orders of the defined types were placed in this encounter.  Follow-up plan:   No follow-ups on file.     Interventional Therapies  Risk  Complexity Considerations:   Estimated body mass index is 39.53 kg/m as calculated  from the following:   Height as of this encounter: '5\' 8"'  (1.727 m).   Weight as of this encounter: 260 lb (117.9 kg). POOR Candidate for any more RFA due to movement and body habitus.   Planned  Pending:   Therapeutic right caudal ESI #1 (04/20/2022)    Under consideration:   Diagnostic/therapeutic bilateral L5 TFESI #1    Completed:   Diagnostic midline-left caudal ESI x3 (08/27/2021) (100/100/90/90)  Palliative right lumbar facet MBB (L2-S1) x3 (08/07/2020) (100/100/100/100)  Palliative left lumbar facet MBB (L2-S1) x2 (10/27/2017) (100/100/95/75-100)  Palliative right lumbar facet RFA (L2-S1) x1 (04/18/2018) (100/100/100/100)  Palliative left lumbar facet RFA (L2-S1) x1 (03/21/2018) (75/75/80/80)    Completed by other providers:   None at this time   Therapeutic  Palliative (PRN) options:   Diagnostic midline caudal ESI #3  Palliative right lumbar facet block #4  Palliative left lumbar facet block #3  Palliative right lumbar facet RFA #2  Palliative  left lumbar facet RFA #2       Recent Visits Date Type Provider Dept  05/13/22 Procedure visit Milinda Pointer, MD [facility]  05/04/22 Office Visit Milinda Pointer, MD [facility]  04/20/22 Procedure visit Milinda Pointer, MD [facility]  04/14/22 Office Visit Milinda Pointer, MD [facility]  Showing recent visits within past 90 days and meeting all other requirements Future Appointments Date Type Provider Dept  05/27/22 Appointment Milinda Pointer, MD [facility]  08/04/22 Appointment Milinda Pointer, MD [facility]  Showing future appointments within next 90 days and meeting all other requirements  I discussed the assessment and treatment plan with the patient. The patient was provided an opportunity to ask questions and all were answered. The patient agreed with the plan and demonstrated an understanding of the instructions.  Patient advised to call back or seek an in-person evaluation if the symptoms or condition worsens.  Duration of encounter: *** minutes.  Total time on encounter, as per AMA guidelines included both the face-to-face and non-face-to-face time personally spent by the physician and/or other qualified health care professional(s) on the day of the encounter (includes time in activities that require the physician or other qualified health care professional and does not include time in activities normally performed by clinical staff). Physician's time may include the following activities when performed: preparing to see the patient (eg, review of tests, pre-charting review of records) obtaining and/or reviewing separately obtained history performing a medically appropriate examination and/or evaluation counseling and educating the patient/family/caregiver ordering medications, tests, or procedures referring and communicating with other health care professionals (when not separately  reported) documenting clinical information in the electronic or other health record independently interpreting results (not separately reported) and communicating results to the patient/ family/caregiver care coordination (not separately reported)  Note by: Gaspar Cola, MD Date: 05/27/2022; Time: 12:11 PM

## 2022-05-27 ENCOUNTER — Encounter: Payer: Self-pay | Admitting: Pain Medicine

## 2022-05-27 ENCOUNTER — Ambulatory Visit: Payer: Medicare Other | Attending: Pain Medicine | Admitting: Pain Medicine

## 2022-05-27 VITALS — BP 141/72 | HR 64 | Temp 98.4°F | Resp 18 | Ht 68.0 in | Wt 254.0 lb

## 2022-05-27 DIAGNOSIS — R4701 Aphasia: Secondary | ICD-10-CM | POA: Insufficient documentation

## 2022-05-27 DIAGNOSIS — R531 Weakness: Secondary | ICD-10-CM | POA: Insufficient documentation

## 2022-05-27 DIAGNOSIS — M5442 Lumbago with sciatica, left side: Secondary | ICD-10-CM | POA: Insufficient documentation

## 2022-05-27 DIAGNOSIS — M5417 Radiculopathy, lumbosacral region: Secondary | ICD-10-CM | POA: Diagnosis not present

## 2022-05-27 DIAGNOSIS — G8929 Other chronic pain: Secondary | ICD-10-CM | POA: Insufficient documentation

## 2022-05-27 DIAGNOSIS — M79604 Pain in right leg: Secondary | ICD-10-CM | POA: Diagnosis not present

## 2022-05-27 DIAGNOSIS — G894 Chronic pain syndrome: Secondary | ICD-10-CM | POA: Insufficient documentation

## 2022-05-27 DIAGNOSIS — Z8673 Personal history of transient ischemic attack (TIA), and cerebral infarction without residual deficits: Secondary | ICD-10-CM | POA: Diagnosis not present

## 2022-05-27 NOTE — Patient Instructions (Signed)
  ____________________________________________________________________________________________  Patient Information update  To: All of our patients.  Re: Name change.  It has been made official that our current name, "[facility]"   will soon be changed to "[facility]".   The purpose of this change is to eliminate any confusion created by the concept of our practice being a "Medication Management Pain Clinic". In the past this has led to the misconception that we treat pain primarily by the use of prescription medications.  Nothing can be farther from the truth.   Understanding PAIN MANAGEMENT: To further understand what our practice does, you first have to understand that "Pain Management" is a subspecialty that requires additional training once a physician has completed their specialty training, which can be in either Anesthesia, Neurology, Psychiatry, or Physical Medicine and Rehabilitation (PMR). Each one of these contributes to the final approach taken by each physician to the management of their patient's pain. To be a "Pain Management Specialist" you must have first completed one of the specialty trainings below.  Anesthesiologists - trained in clinical pharmacology and interventional techniques such as nerve blockade and regional as well as central neuroanatomy. They are trained to block pain before, during, and after surgical interventions.  Neurologists - trained in the diagnosis and pharmacological treatment of complex neurological conditions, such as Multiple Sclerosis, Parkinson's, spinal cord injuries, and other systemic conditions that may be associated with symptoms that may include but are not limited to pain. They tend to rely primarily on the treatment of chronic pain using prescription medications.  Psychiatrist - trained in conditions affecting the psychosocial  wellbeing of patients including but not limited to depression, anxiety, schizophrenia, personality disorders, addiction, and other substance use disorders that may be associated with chronic pain. They tend to rely primarily on the treatment of chronic pain using prescription medications.   Physical Medicine and Rehabilitation (PMR) physicians, also known as physiatrists - trained to treat a wide variety of medical conditions affecting the brain, spinal cord, nerves, bones, joints, ligaments, muscles, and tendons. Their training is primarily aimed at treating patients that have suffered injuries that have caused severe physical impairment. Their training is primarily aimed at the physical therapy and rehabilitation of those patients. They may also work alongside orthopedic surgeons or neurosurgeons using their expertise in assisting surgical patients to recover after their surgeries.  INTERVENTIONAL PAIN MANAGEMENT is sub-subspecialty of Pain Management.  Our physicians are Board-certified in Anesthesia, Pain Management, and Interventional Pain Management.  This meaning that not only have they been trained and Board-certified in their specialty of Anesthesia, and subspecialty of Pain Management, but they have also received further training in the sub-subspecialty of Interventional Pain Management, in order to become Board-certified as INTERVENTIONAL PAIN MANAGEMENT SPECIALIST.    Mission: Our goal is to use our skills in  INTERVENTIONAL PAIN MANAGEMENT as alternatives to the chronic use of prescription opioid medications for the treatment of pain. To make this more clear, we have changed our name to reflect what we do and offer. We will continue to offer medication management assessment and recommendations, but we will not be taking over any patient's medication management.  ____________________________________________________________________________________________     

## 2022-05-27 NOTE — Progress Notes (Signed)
Safety precautions to be maintained throughout the outpatient stay will include: orient to surroundings, keep bed in low position, maintain call bell within reach at all times, provide assistance with transfer out of bed and ambulation.  

## 2022-06-01 ENCOUNTER — Ambulatory Visit (INDEPENDENT_AMBULATORY_CARE_PROVIDER_SITE_OTHER): Payer: Medicare Other | Admitting: Internal Medicine

## 2022-06-01 ENCOUNTER — Encounter: Payer: Self-pay | Admitting: Internal Medicine

## 2022-06-01 DIAGNOSIS — R29898 Other symptoms and signs involving the musculoskeletal system: Secondary | ICD-10-CM | POA: Diagnosis not present

## 2022-06-01 DIAGNOSIS — M47816 Spondylosis without myelopathy or radiculopathy, lumbar region: Secondary | ICD-10-CM | POA: Diagnosis not present

## 2022-06-01 DIAGNOSIS — E039 Hypothyroidism, unspecified: Secondary | ICD-10-CM

## 2022-06-01 DIAGNOSIS — R739 Hyperglycemia, unspecified: Secondary | ICD-10-CM | POA: Diagnosis not present

## 2022-06-01 DIAGNOSIS — N1831 Chronic kidney disease, stage 3a: Secondary | ICD-10-CM | POA: Diagnosis not present

## 2022-06-01 DIAGNOSIS — E7800 Pure hypercholesterolemia, unspecified: Secondary | ICD-10-CM

## 2022-06-01 DIAGNOSIS — Z23 Encounter for immunization: Secondary | ICD-10-CM

## 2022-06-01 DIAGNOSIS — Z8546 Personal history of malignant neoplasm of prostate: Secondary | ICD-10-CM

## 2022-06-01 DIAGNOSIS — I10 Essential (primary) hypertension: Secondary | ICD-10-CM | POA: Diagnosis not present

## 2022-06-01 NOTE — Progress Notes (Signed)
Patient ID: Hector MICALE, male   DOB: 03-03-1935, 87 y.o.   MRN: [id number]   Subjective:    [name], male    DOB: July 20, 1935, 87 y.o.   MRN: [id number]   Patient here for  Chief Complaint  Patient presents with   Follow-up    Numbness/weakness R side - arm/leg   .   HPI Here to follow up regarding his blood pressure and cholesterol.  Also concern today regarding weakness.  He is accompanied by his two daughters.  History obtained from all of them.  Reports received injection (pain clinic) in September.  Had f/u 10/3 - walked in.  Subsequent f/u 10/12 - reported weakness.  Reports more labored walking.  Weakness.  Was initially questioning if he was having mini strokes.  On questioning, denies focal neurological change.  Is sleeping more.  Some memory issues reported.  Is eating and drinking.     Past Medical History:  Diagnosis Date   Acute postoperative pain 03/21/2018   Anemia    Arthritis    Barrett esophagus    Cancer ([place])    prostate,skin   Chicken pox    Diverticulitis    Dysrhythmia    GERD (gastroesophageal reflux disease)    Hyperlipidemia    Hypertension    Hypothyroidism    Melanoma ([place])    Malignant resection   Sleep apnea    Ulcer    Past Surgical History:  Procedure Laterality Date   CARDIAC CATHETERIZATION     Cataract Surgery Right 02/13/14   COLON SURGERY  2006-2008-2011   polyps removed   COLONOSCOPY W/ POLYPECTOMY     COLONOSCOPY WITH PROPOFOL N/A 02/15/2018   Procedure: COLONOSCOPY WITH PROPOFOL;  Surgeon: Manya Silvas, MD;  Location: [facility] ENDOSCOPY;  Service: Endoscopy;  Laterality: N/A;   COLONOSCOPY WITH PROPOFOL N/A 03/24/2020   Procedure: COLONOSCOPY WITH PROPOFOL;  Surgeon: Lesly Rubenstein, MD;  Location: [facility] ENDOSCOPY;  Service: Endoscopy;  Laterality: N/A;   cystocopy  2003   CYSTOSCOPY/RETROGRADE/URETEROSCOPY/STONE EXTRACTION WITH BASKET Right 10/28/2020   Procedure: CYSTOSCOPY/RETROGRADE PYELOGRAM/STONE EXTRACTION;   Surgeon: Abbie Sons, MD;  Location: [facility] ORS;  Service: Urology;  Laterality: Right;   ESOPHAGOGASTRODUODENOSCOPY (EGD) WITH PROPOFOL N/A 08/04/2016   Procedure: ESOPHAGOGASTRODUODENOSCOPY (EGD) WITH PROPOFOL;  Surgeon: Manya Silvas, MD;  Location: [facility] ENDOSCOPY;  Service: Endoscopy;  Laterality: N/A;   ESOPHAGOGASTRODUODENOSCOPY (EGD) WITH PROPOFOL N/A 03/24/2020   Procedure: ESOPHAGOGASTRODUODENOSCOPY (EGD) WITH PROPOFOL;  Surgeon: Lesly Rubenstein, MD;  Location: [facility] ENDOSCOPY;  Service: Endoscopy;  Laterality: N/A;   HEMORRHOID SURGERY     PROSTATE SURGERY     sleep study     Family History  Problem Relation Age of Onset   Liver disease Mother    Heart disease Father    Kidney disease Father    Breast cancer Daughter    Social History   Socioeconomic History   Marital status: Widowed    Spouse name: Not on file   Number of children: Not on file   Years of education: Not on file   Highest education level: Not on file  Occupational History   Not on file  Tobacco Use   Smoking status: Never   Smokeless tobacco: Never  Vaping Use   Vaping Use: Never used  Substance and Sexual Activity   Alcohol use: No    Alcohol/week: 0.0 standard drinks of alcohol   Drug use: No   Sexual activity: Never  Other Topics Concern  Not on file  Social History Narrative   Not on file   Social Determinants of Health   Financial Resource Strain: Low Risk  (06/03/2022)   Overall Financial Resource Strain (CARDIA)    Difficulty of Paying Living Expenses: Not hard at all  Food Insecurity: No Food Insecurity (06/03/2022)   Hunger Vital Sign    Worried About Running Out of Food in the Last Year: Never true    Ran Out of Food in the Last Year: Never true  Transportation Needs: No Transportation Needs (06/03/2022)   PRAPARE - Hydrologist (Medical): No    Lack of Transportation (Non-Medical): No  Physical Activity: Unknown (06/03/2022)   Exercise Vital  Sign    Days of Exercise per Week: 0 days    Minutes of Exercise per Session: Not on file  Stress: No Stress Concern Present (06/01/2021)   [place]    Feeling of Stress : Not at all  Social Connections: Moderately Isolated (06/03/2022)   Social Connection and Isolation Panel [NHANES]    Frequency of Communication with Friends and Family: More than three times a week    Frequency of Social Gatherings with Friends and Family: More than three times a week    Attends Religious Services: More than 4 times per year    Active Member of [employer] or Organizations: No    Attends Archivist Meetings: Not on file    Marital Status: Widowed     Review of Systems  Constitutional:  Positive for fatigue. Negative for unexpected weight change.  HENT:  Negative for congestion and sinus pressure.   Respiratory:  Negative for cough and chest tightness.        Breathing stable.   Cardiovascular:  Negative for chest pain and palpitations.  Gastrointestinal:  Negative for abdominal pain, diarrhea, nausea and vomiting.  Genitourinary:  Negative for difficulty urinating and dysuria.  Musculoskeletal:  Negative for joint swelling and myalgias.  Skin:  Negative for color change and rash.  Neurological:  Negative for dizziness.       No increased headaches.   Psychiatric/Behavioral:  Negative for agitation.        Increased stress.       Objective:     BP (!) 140/80 (BP Location: Left Arm, Patient Position: Sitting, Cuff Size: Large)   Pulse 84   Temp 98.2 F (36.8 C) (Temporal)   Resp 18   Ht _0  (1.727 m)   Wt 254 lb (115.2 kg)   SpO2 98%   BMI 38.62 kg/m  Wt Readings from Last 3 Encounters:  06/03/22 254 lb (115.2 kg)  06/01/22 254 lb (115.2 kg)  05/27/22 254 lb (115.2 kg)    Physical Exam Vitals reviewed.  Constitutional:      General: He is not in acute distress.    Appearance: Normal appearance. He is  well-developed.  HENT:     Head: Normocephalic and atraumatic.     Right Ear: External ear normal.     Left Ear: External ear normal.  Eyes:     General: No scleral icterus.       Right eye: No discharge.        Left eye: No discharge.     Conjunctiva/sclera: Conjunctivae normal.  Cardiovascular:     Rate and Rhythm: Normal rate and regular rhythm.  Pulmonary:     Effort: Pulmonary effort is normal. No respiratory distress.  Breath sounds: Normal breath sounds.  Abdominal:     General: Bowel sounds are normal.     Palpations: Abdomen is soft.     Tenderness: There is no abdominal tenderness.  Musculoskeletal:        General: No swelling or tenderness.     Cervical back: Neck supple. No tenderness.  Lymphadenopathy:     Cervical: No cervical adenopathy.  Skin:    Findings: No erythema or rash.  Neurological:     Mental Status: He is alert.     Comments: Able to move all extremities.  Motor strength appeared to be relatively equal bilateral upper and lower extremities - possible minimal decreased with grip - right hand.  CN appear to be grossly intact.  Has to use arms - to help stand.    Psychiatric:        Mood and Affect: Mood normal.        Behavior: Behavior normal.      Outpatient Encounter Medications as of 06/01/2022  Medication Sig   acetaminophen (TYLENOL) 500 MG tablet Take 500 mg by mouth every 6 (six) hours as needed.   allopurinol (ZYLOPRIM) 100 MG tablet TAKE 1 TABLET BY MOUTH DAILY   amLODipine (NORVASC) 10 MG tablet TAKE 1 TABLET BY MOUTH DAILY   aspirin 81 MG tablet Take 81 mg by mouth daily.   calcium carbonate (OSCAL) 1500 (600 Ca) MG TABS tablet TAKE 2 TABLETS BY MOUTH DAILY WITH BREAKFAST   fluticasone (FLONASE) 50 MCG/ACT nasal spray TWO PUFFS IN EACH NOSTRIL ONCE A DAY   hydrALAZINE (APRESOLINE) 50 MG tablet TAKE 1 TABLET BY MOUTH 3 TIMES DAILY (Patient taking differently: Take 50 mg by mouth 2 (two) times daily.)   irbesartan (AVAPRO) 300 MG  tablet Take 1 tablet (300 mg total) by mouth daily.   levothyroxine (SYNTHROID) 125 MCG tablet Take 1 tablet (125 mcg total) by mouth daily.   LYSINE PO Take 1 tablet by mouth daily.   omeprazole (PRILOSEC) 20 MG capsule TAKE ONE CAPSULE BY MOUTH TWICE A DAY   rosuvastatin (CRESTOR) 5 MG tablet TAKE 1 TABLET BY MOUTH DAILY   senna (SENOKOT) 8.6 MG TABS tablet Take 2 tablets by mouth daily.   traMADol (ULTRAM) 50 MG tablet Take 1 tablet (50 mg total) by mouth in the morning, at noon, and at bedtime. Each refill must last 30 days.   valACYclovir (VALTREX) 1000 MG tablet TAKE 1 TABLET BY MOUTH DAILY   VITAMIN D PO Take 1 tablet by mouth daily.   No facility-administered encounter medications on file as of 06/01/2022.     Lab Results  Component Value Date   WBC 8.4 06/01/2022   HGB 15.2 06/01/2022   HCT 46.7 06/01/2022   PLT 209.0 06/01/2022   GLUCOSE 106 (H) 06/01/2022   CHOL 180 01/27/2022   TRIG 275.0 (H) 01/27/2022   HDL 49.60 01/27/2022   LDLDIRECT 84.0 01/27/2022   LDLCALC 54 02/05/2021   ALT 30 06/01/2022   AST 26 06/01/2022   NA 138 06/01/2022   K 4.5 06/01/2022   CL 102 06/01/2022   CREATININE 1.74 (H) 06/01/2022   BUN 25 (H) 06/01/2022   CO2 26 06/01/2022   TSH 2.30 06/01/2022   PSA 0.00 (L) 06/16/2020   HGBA1C 5.5 01/27/2022    DG PAIN CLINIC C-ARM 1-60 MIN NO REPORT  Result Date: 05/13/2022 Fluoro was used, but no Radiologist interpretation will be provided. Please refer to "NOTES" tab for provider progress note.  Assessment & Plan:   Problem List Items Addressed This Visit     CKD (chronic kidney disease) stage 3, GFR 30-59 ml/min (HCC)    Continues on avapro.  Avoid antiinflammatories.  Follow metabolic panel. Renal ultrasound - no acute abnormality.        History of prostate cancer    S/p prostatectomy.  Followed by Dr Bernardo Heater.        Hypercholesterolemia    Low cholesterol diet and exercise. Crestor.  Follow lipid panel and liver function  tests.       Relevant Orders   CBC with Differential/Platelet (Completed)   TSH (Completed)   Basic metabolic panel (Completed)   Hepatic function panel (Completed)   Hyperglycemia    Low carb diet and exercise.  Follow met b and a1c.       Hypertension    Taking hydralazine, avapro and amlodipine.  Taking hydralazine to 59m bid.  Follow pressures. Follow metabolic panel.       Hypothyroid    On thyroid replacement.  Follow tsh.       Leg weakness    Increased weakness reported.  No significant focal finding on exam.  Hard for him to stand - using arms to help get up.  Does not walk around a lot.  Has walker.  Discussed importance of strengthening legs/core.  Difficulty with eating.  Feels right arm is weaker. Discussed home health  PT/OT.  Agreeable.       Relevant Orders   Vitamin B12 (Completed)   Ambulatory referral to [facility] lumbar spine (Chronic)    Followed by pain clinic.       Relevant Orders   Ambulatory referral to [facility]  Other Visit Diagnoses     Need for influenza vaccination       Relevant Orders   Flu Vaccine QUAD High Dose(Fluad) (Completed)      I spent 45 minutes with the patient . Time spent discussing her current concerns and symptoms.  Specifically time spent discussing his current concerns regarding weakness, etc.  Time also spent discussing further w/up, evaluation and treatment.    CEinar Pheasant MD

## 2022-06-02 LAB — CBC WITH DIFFERENTIAL/PLATELET
Basophils Absolute: 0.1 10*3/uL (ref 0.0–0.1)
Basophils Relative: 1.1 % (ref 0.0–3.0)
Eosinophils Absolute: 0.1 10*3/uL (ref 0.0–0.7)
Eosinophils Relative: 0.7 % (ref 0.0–5.0)
HCT: 46.7 % (ref 39.0–52.0)
Hemoglobin: 15.2 g/dL (ref 13.0–17.0)
Lymphocytes Relative: 21.3 % (ref 12.0–46.0)
Lymphs Abs: 1.8 10*3/uL (ref 0.7–4.0)
MCHC: 32.5 g/dL (ref 30.0–36.0)
MCV: 95.1 fl (ref 78.0–100.0)
Monocytes Absolute: 0.7 10*3/uL (ref 0.1–1.0)
Monocytes Relative: 8.6 % (ref 3.0–12.0)
Neutro Abs: 5.7 10*3/uL (ref 1.4–7.7)
Neutrophils Relative %: 68.3 % (ref 43.0–77.0)
Platelets: 209 10*3/uL (ref 150.0–400.0)
RBC: 4.91 Mil/uL (ref 4.22–5.81)
RDW: 14.7 % (ref 11.5–15.5)
WBC: 8.4 10*3/uL (ref 4.0–10.5)

## 2022-06-02 LAB — BASIC METABOLIC PANEL
BUN: 25 mg/dL — ABNORMAL HIGH (ref 6–23)
CO2: 26 mEq/L (ref 19–32)
Calcium: 9.6 mg/dL (ref 8.4–10.5)
Chloride: 102 mEq/L (ref 96–112)
Creatinine, Ser: 1.74 mg/dL — ABNORMAL HIGH (ref 0.40–1.50)
GFR: 35.44 mL/min — ABNORMAL LOW (ref >60.00)
Glucose, Bld: 106 mg/dL — ABNORMAL HIGH (ref 70–99)
Potassium: 4.5 mEq/L (ref 3.5–5.1)
Sodium: 138 mEq/L (ref 135–145)

## 2022-06-02 LAB — HEPATIC FUNCTION PANEL
ALT: 30 U/L (ref 0–53)
AST: 26 U/L (ref 0–37)
Albumin: 4.2 g/dL (ref 3.5–5.2)
Alkaline Phosphatase: 61 U/L (ref 39–117)
Bilirubin, Direct: 0.1 mg/dL (ref 0.0–0.3)
Total Bilirubin: 0.4 mg/dL (ref 0.2–1.2)
Total Protein: 6.8 g/dL (ref 6.0–8.3)

## 2022-06-02 LAB — TSH: TSH: 2.3 u[IU]/mL (ref 0.35–5.50)

## 2022-06-02 LAB — VITAMIN B12: Vitamin B-12: 270 pg/mL (ref 211–911)

## 2022-06-03 ENCOUNTER — Ambulatory Visit (INDEPENDENT_AMBULATORY_CARE_PROVIDER_SITE_OTHER): Payer: Medicare Other

## 2022-06-03 ENCOUNTER — Encounter: Payer: Self-pay | Admitting: Internal Medicine

## 2022-06-03 VITALS — Ht 68.0 in | Wt 254.0 lb

## 2022-06-03 DIAGNOSIS — Z0000 Encounter for general adult medical examination without abnormal findings: Secondary | ICD-10-CM | POA: Diagnosis not present

## 2022-06-03 NOTE — Patient Instructions (Addendum)
[name] , Thank you for taking time to come for your Medicare Wellness Visit. I appreciate your ongoing commitment to your health goals. Please review the following plan we discussed and let me know if I can assist you in the future.   These are the goals we discussed:  Goals      Healthy Lifestyle     Stay active and hydrated. Healthy diet; portion control.          This is a list of the screening recommended for you and due dates:  Health Maintenance  Topic Date Due   COVID-19 Vaccine (4 - Pfizer risk series) 06/17/2022*   Zoster (Shingles) Vaccine (1 of 2) 09/01/2022*   Medicare Annual Wellness Visit  06/04/2023   Tetanus Vaccine  04/17/2024   Pneumonia Vaccine  Completed   Flu Shot  Completed   HPV Vaccine  Aged Out  *Topic was postponed. The date shown is not the original due date.    Advanced directives: on file  Conditions/risks identified: none new.   Next appointment: Follow up in one year for your annual wellness visit.   Preventive Care 65 Years and Older, Male  Preventive care refers to lifestyle choices and visits with your health care provider that can promote health and wellness. What does preventive care include? A yearly physical exam. This is also called an annual well check. Dental exams once or twice a year. Routine eye exams. Ask your health care provider how often you should have your eyes checked. Personal lifestyle choices, including: Daily care of your teeth and gums. Regular physical activity. Eating a healthy diet. Avoiding tobacco and drug use. Limiting alcohol use. Practicing safe sex. Taking low doses of aspirin every day. Taking vitamin and mineral supplements as recommended by your health care provider. What happens during an annual well check? The services and screenings done by your health care provider during your annual well check will depend on your age, overall health, lifestyle risk factors, and family history of  disease. Counseling  Your health care provider may ask you questions about your: Alcohol use. Tobacco use. Drug use. Emotional well-being. Home and relationship well-being. Sexual activity. Eating habits. History of falls. Memory and ability to understand (cognition). Work and work Statistician. Screening  You may have the following tests or measurements: Height, weight, and BMI. Blood pressure. Lipid and cholesterol levels. These may be checked every 5 years, or more frequently if you are over 53 years old. Skin check. Lung cancer screening. You may have this screening every year starting at age 3 if you have a 30-pack-year history of smoking and currently smoke or have quit within the past 15 years. Fecal occult blood test (FOBT) of the stool. You may have this test every year starting at age 48. Flexible sigmoidoscopy or colonoscopy. You may have a sigmoidoscopy every 5 years or a colonoscopy every 10 years starting at age 66. Prostate cancer screening. Recommendations will vary depending on your family history and other risks. Hepatitis C blood test. Hepatitis B blood test. Sexually transmitted disease (STD) testing. Diabetes screening. This is done by checking your blood sugar (glucose) after you have not eaten for a while (fasting). You may have this done every 1-3 years. Abdominal aortic aneurysm (AAA) screening. You may need this if you are a current or former smoker. Osteoporosis. You may be screened starting at age 35 if you are at high risk. Talk with your health care provider about your test results, treatment options,  and if necessary, the need for more tests. Vaccines  Your health care provider may recommend certain vaccines, such as: Influenza vaccine. This is recommended every year. Tetanus, diphtheria, and acellular pertussis (Tdap, Td) vaccine. You may need a Td booster every 10 years. Zoster vaccine. You may need this after age 53. Pneumococcal 13-valent  conjugate (PCV13) vaccine. One dose is recommended after age 4. Pneumococcal polysaccharide (PPSV23) vaccine. One dose is recommended after age 27. Talk to your health care provider about which screenings and vaccines you need and how often you need them. This information is not intended to replace advice given to you by your health care provider. Make sure you discuss any questions you have with your health care provider. Document Released: 08/15/2015 Document Revised: 04/07/2016 Document Reviewed: 05/20/2015 Elsevier Interactive Patient Education  2017 [place] Prevention in the Home Falls can cause injuries. They can happen to people of all ages. There are many things you can do to make your home safe and to help prevent falls. What can I do on the outside of my home? Regularly fix the edges of walkways and driveways and fix any cracks. Remove anything that might make you trip as you walk through a door, such as a raised step or threshold. Trim any bushes or trees on the path to your home. Use bright outdoor lighting. Clear any walking paths of anything that might make someone trip, such as rocks or tools. Regularly check to see if handrails are loose or broken. Make sure that both sides of any steps have handrails. Any raised decks and porches should have guardrails on the edges. Have any leaves, snow, or ice cleared regularly. Use sand or salt on walking paths during winter. Clean up any spills in your garage right away. This includes oil or grease spills. What can I do in the bathroom? Use night lights. Install grab bars by the toilet and in the tub and shower. Do not use towel bars as grab bars. Use non-skid mats or decals in the tub or shower. If you need to sit down in the shower, use a plastic, non-slip stool. Keep the floor dry. Clean up any water that spills on the floor as soon as it happens. Remove soap buildup in the tub or shower regularly. Attach bath mats  securely with double-sided non-slip rug tape. Do not have throw rugs and other things on the floor that can make you trip. What can I do in the bedroom? Use night lights. Make sure that you have a light by your bed that is easy to reach. Do not use any sheets or blankets that are too big for your bed. They should not hang down onto the floor. Have a firm chair that has side arms. You can use this for support while you get dressed. Do not have throw rugs and other things on the floor that can make you trip. What can I do in the kitchen? Clean up any spills right away. Avoid walking on wet floors. Keep items that you use a lot in easy-to-reach places. If you need to reach something above you, use a strong step stool that has a grab bar. Keep electrical cords out of the way. Do not use floor polish or wax that makes floors slippery. If you must use wax, use non-skid floor wax. Do not have throw rugs and other things on the floor that can make you trip. What can I do with my stairs? Do not leave  any items on the stairs. Make sure that there are handrails on both sides of the stairs and use them. Fix handrails that are broken or loose. Make sure that handrails are as long as the stairways. Check any carpeting to make sure that it is firmly attached to the stairs. Fix any carpet that is loose or worn. Avoid having throw rugs at the top or bottom of the stairs. If you do have throw rugs, attach them to the floor with carpet tape. Make sure that you have a light switch at the top of the stairs and the bottom of the stairs. If you do not have them, ask someone to add them for you. What else can I do to help prevent falls? Wear shoes that: Do not have high heels. Have rubber bottoms. Are comfortable and fit you well. Are closed at the toe. Do not wear sandals. If you use a stepladder: Make sure that it is fully opened. Do not climb a closed stepladder. Make sure that both sides of the stepladder  are locked into place. Ask someone to hold it for you, if possible. Clearly mark and make sure that you can see: Any grab bars or handrails. First and last steps. Where the edge of each step is. Use tools that help you move around (mobility aids) if they are needed. These include: Canes. Walkers. Scooters. Crutches. Turn on the lights when you go into a dark area. Replace any light bulbs as soon as they burn out. Set up your furniture so you have a clear path. Avoid moving your furniture around. If any of your floors are uneven, fix them. If there are any pets around you, be aware of where they are. Review your medicines with your doctor. Some medicines can make you feel dizzy. This can increase your chance of falling. Ask your doctor what other things that you can do to help prevent falls. This information is not intended to replace advice given to you by your health care provider. Make sure you discuss any questions you have with your health care provider. Document Released: 05/15/2009 Document Revised: 12/25/2015 Document Reviewed: 08/23/2014 Elsevier Interactive Patient Education  2017 [employer].

## 2022-06-03 NOTE — Progress Notes (Signed)
Subjective:   Hector [name] is a 87 y.o. male who presents for Medicare Annual/Subsequent preventive examination.  Review of Systems    No ROS.  Medicare Wellness Virtual Visit.  Visual/audio telehealth visit, UTA vital signs.   See social history for additional risk factors.   Cardiac Risk Factors include: advanced age (>65mn, >>19women);male gender;hypertension     Objective:    Today's Vitals   06/03/22 1409  Weight: 254 lb (115.2 kg)  Height: '5\' 8"'$  (1.727 m)   Body mass index is 38.62 kg/m.     06/03/2022    2:12 PM 05/27/2022    2:15 PM 05/04/2022    1:37 PM 04/20/2022   11:01 AM 04/14/2022   11:30 AM 06/01/2021    9:38 AM 10/28/2020    1:48 PM  Advanced Directives  Does Patient Have a Medical Advance Directive? Yes No No No No Yes Yes  Type of [place] Does patient want to make changes to medical advance directive? No - Patient declined     No - Patient declined No - Patient declined  Copy of [place] Chart? Yes - validated most recent copy scanned in chart (See row information)     Yes - validated most recent copy scanned in chart (See row information) No - copy requested  Would patient like information on creating a medical advance directive?  No - Patient declined  No - Patient declined No - Patient declined      Current Medications (verified) Outpatient Encounter Medications as of 06/03/2022  Medication Sig   acetaminophen (TYLENOL) 500 MG tablet Take 500 mg by mouth every 6 (six) hours as needed.   allopurinol (ZYLOPRIM) 100 MG tablet TAKE 1 TABLET BY MOUTH DAILY   amLODipine (NORVASC) 10 MG tablet TAKE 1 TABLET BY MOUTH DAILY   aspirin 81 MG tablet Take 81 mg by mouth daily.   calcium carbonate (OSCAL) 1500 (600 Ca) MG TABS tablet TAKE 2 TABLETS BY MOUTH DAILY WITH BREAKFAST   fluticasone (FLONASE) 50 MCG/ACT nasal  spray TWO PUFFS IN EACH NOSTRIL ONCE A DAY   hydrALAZINE (APRESOLINE) 50 MG tablet TAKE 1 TABLET BY MOUTH 3 TIMES DAILY (Patient taking differently: Take 50 mg by mouth 2 (two) times daily.)   irbesartan (AVAPRO) 300 MG tablet Take 1 tablet (300 mg total) by mouth daily.   levothyroxine (SYNTHROID) 125 MCG tablet Take 1 tablet (125 mcg total) by mouth daily.   LYSINE PO Take 1 tablet by mouth daily.   omeprazole (PRILOSEC) 20 MG capsule TAKE ONE CAPSULE BY MOUTH TWICE A DAY   rosuvastatin (CRESTOR) 5 MG tablet TAKE 1 TABLET BY MOUTH DAILY   senna (SENOKOT) 8.6 MG TABS tablet Take 2 tablets by mouth daily.   traMADol (ULTRAM) 50 MG tablet Take 1 tablet (50 mg total) by mouth in the morning, at noon, and at bedtime. Each refill must last 30 days.   valACYclovir (VALTREX) 1000 MG tablet TAKE 1 TABLET BY MOUTH DAILY   VITAMIN D PO Take 1 tablet by mouth daily.   No facility-administered encounter medications on file as of 06/03/2022.    Allergies (verified) Tape   History: Past Medical History:  Diagnosis Date   Acute postoperative pain 03/21/2018   Anemia    Arthritis    Barrett esophagus    Cancer ([facility]    prostate,skin  Chicken pox    Diverticulitis    Dysrhythmia    GERD (gastroesophageal reflux disease)    Hyperlipidemia    Hypertension    Hypothyroidism    Melanoma ([place])    Malignant resection   Sleep apnea    Ulcer    Past Surgical History:  Procedure Laterality Date   CARDIAC CATHETERIZATION     Cataract Surgery Right 02/13/14   COLON SURGERY  2006-2008-2011   polyps removed   COLONOSCOPY W/ POLYPECTOMY     COLONOSCOPY WITH PROPOFOL N/A 02/15/2018   Procedure: COLONOSCOPY WITH PROPOFOL;  Surgeon: Manya Silvas, MD;  [facility] ENDOSCOPY;  Service: Endoscopy;  Laterality: N/A;   COLONOSCOPY WITH PROPOFOL N/A 03/24/2020   Procedure: COLONOSCOPY WITH PROPOFOL;  Surgeon: Lesly Rubenstein, MD;  Location: [facility] ENDOSCOPY;  Service: Endoscopy;  Laterality: N/A;    cystocopy  2003   CYSTOSCOPY/RETROGRADE/URETEROSCOPY/STONE EXTRACTION WITH BASKET Right 10/28/2020   Procedure: CYSTOSCOPY/RETROGRADE PYELOGRAM/STONE EXTRACTION;  Surgeon: Abbie Sons, MD;  Location: [facility] ORS;  Service: Urology;  Laterality: Right;   ESOPHAGOGASTRODUODENOSCOPY (EGD) WITH PROPOFOL N/A 08/04/2016   Procedure: ESOPHAGOGASTRODUODENOSCOPY (EGD) WITH PROPOFOL;  Surgeon: Manya Silvas, MD;  Location: [facility] ENDOSCOPY;  Service: Endoscopy;  Laterality: N/A;   ESOPHAGOGASTRODUODENOSCOPY (EGD) WITH PROPOFOL N/A 03/24/2020   Procedure: ESOPHAGOGASTRODUODENOSCOPY (EGD) WITH PROPOFOL;  Surgeon: Lesly Rubenstein, MD;  Location: [facility] ENDOSCOPY;  Service: Endoscopy;  Laterality: N/A;   HEMORRHOID SURGERY     PROSTATE SURGERY     sleep study     Family History  Problem Relation Age of Onset   Liver disease Mother    Heart disease Father    Kidney disease Father    Breast cancer Daughter    Social History   Socioeconomic History   Marital status: Widowed    Spouse name: Not on file   Number of children: Not on file   Years of education: Not on file   Highest education level: Not on file  Occupational History   Not on file  Tobacco Use   Smoking status: Never   Smokeless tobacco: Never  Vaping Use   Vaping Use: Never used  Substance and Sexual Activity   Alcohol use: No    Alcohol/week: 0.0 standard drinks of alcohol   Drug use: No   Sexual activity: Never  Other Topics Concern   Not on file  Social History Narrative   Not on file   Social Determinants of Health   Financial Resource Strain: Low Risk  (06/03/2022)   Overall Financial Resource Strain (CARDIA)    Difficulty of Paying Living Expenses: Not hard at all  Food Insecurity: No Food Insecurity (06/03/2022)   Hunger Vital Sign    Worried About Running Out of Food in the Last Year: Never true    [place] in the Last Year: Never true  Transportation Needs: No Transportation Needs (06/03/2022)   PRAPARE -  Hydrologist (Medical): No    Lack of Transportation (Non-Medical): No  Physical Activity: Unknown (06/03/2022)   Exercise Vital Sign    Days of Exercise per Week: 0 days    Minutes of Exercise per Session: Not on file  Stress: No Stress Concern Present (06/01/2021)   [place]    Feeling of Stress : Not at all  Social Connections: Moderately Isolated (06/03/2022)   Social Connection and Isolation Panel [NHANES]    Frequency of Communication with Friends  and Family: More than three times a week    Frequency of Social Gatherings with Friends and Family: More than three times a week    Attends Religious Services: More than 4 times per year    Active Member of [employer] or Organizations: No    Attends Archivist Meetings: Not on file    Marital Status: Widowed    Tobacco Counseling Counseling given: Not Answered   Clinical Intake:  Pre-visit preparation completed: Yes        Diabetes: No  How often do you need to have someone help you when you read instructions, pamphlets, or other written materials from your doctor or pharmacy?: 1 - Never  Interpreter Needed?: No      Activities of Daily Living    06/03/2022    2:14 PM  In your present state of health, do you have any difficulty performing the following activities:  Hearing? 0  Vision? 0  Difficulty concentrating or making decisions? 0  Walking or climbing stairs? 1  Comment Cane in use. Paces self with activity.  Dressing or bathing? 0  Doing errands, shopping? 1  Comment Family assist.  Preparing Food and eating ? N  Comment Family meal prep. Self feeds.  Using the Toilet? N  In the past six months, have you accidently leaked urine? N  Do you have problems with loss of bowel control? N  Managing your Medications? N  Managing your Finances? Y  Housekeeping or managing your Housekeeping? N    Patient Care  Team: Einar Pheasant, MD as PCP - General (Internal Medicine) Kate Sable, MD as PCP - Cardiology (Cardiology) Einar Pheasant, MD (Internal Medicine)  Indicate any recent Medical Services you may have received from other than [facility] providers in the past year (date may be approximate).     Assessment:   This is a routine wellness examination for Shriyan.  I connected with  Hector [name] on 06/03/22 by a audio enabled telemedicine application and verified that I am speaking with the correct person using two identifiers.  Patient Location: Home  Provider Location: Office/Clinic  I discussed the limitations of evaluation and management by telemedicine. The patient expressed understanding and agreed to proceed.   Hearing/Vision screen Hearing Screening - Comments:: Patient is able to hear conversational tones without difficulty. No issues reported. Vision Screening - Comments:: Followed by Dr. Graciella Belton [place] Wears glasses Cataract extraction, R eye   Dietary issues and exercise activities discussed:   Regular diet   Goals Addressed             This Visit's Progress    Healthy Lifestyle   On track    Stay active and hydrated. Healthy diet; portion control.         Depression Screen    06/03/2022    2:11 PM 05/27/2022    2:15 PM 05/04/2022    1:37 PM 04/14/2022   11:30 AM 04/06/2022    4:04 PM 02/01/2022    1:14 PM 11/23/2021    8:49 AM  PHQ 2/9 Scores  PHQ - 2 Score 0 0 0 0 0 0 0    Fall Risk    06/03/2022    2:19 PM 05/27/2022    2:15 PM 05/13/2022   10:49 AM 05/04/2022    1:37 PM 04/20/2022   11:00 AM  [place] in the past year?  1 0 0 0  Comment None since last reported 2 days ago.  Number falls in past yr:  0 0    Injury with Fall?  1     Risk for fall due to :  History of fall(s) No Fall Risks    Follow up Falls prevention discussed;Falls evaluation completed  Falls evaluation completed      FALL RISK PREVENTION PERTAINING TO THE  HOME: Home free of loose throw rugs in walkways, pet beds, electrical cords, etc? Yes  Adequate lighting in your home to reduce risk of falls? Yes   ASSISTIVE DEVICES UTILIZED TO PREVENT FALLS: Life alert? Yes  Use of a cane, walker or w/c? Yes  Grab bars in the bathroom? Yes  Shower chair or bench in shower? Yes  Elevated toilet seat or a handicapped toilet? Yes   TIMED UP AND GO: Was the test performed? No .   Cognitive Function:    05/24/2016    2:07 PM 05/13/2015    9:26 AM  MMSE - Mini Mental State Exam  Orientation to time 5 5  Orientation to Place 5 5  Registration 3 3  Attention/ Calculation 5 5  Recall 3 3  Language- name 2 objects 2 2  Language- repeat 1 1  Language- follow 3 step command 3 3  Language- read & follow direction 1 1  Write a sentence 1 1  Copy design 1 1  Total score 30 30        06/03/2022    2:16 PM 05/29/2020    9:53 AM 05/29/2019    9:46 AM 05/25/2018    8:55 AM 05/24/2017    9:26 AM  6CIT Screen  What Year? 0 points 0 points 0 points 0 points 0 points  What month? 0 points 0 points 0 points 0 points 0 points  What time? 0 points 0 points 0 points 0 points 0 points  Count back from 20 0 points 0 points 0 points 0 points 0 points  Months in reverse 0 points  0 points 0 points 0 points  Repeat phrase 0 points  0 points 0 points 0 points  Total Score 0 points  0 points 0 points 0 points    Immunizations Immunization History  Administered Date(s) Administered   Fluad Quad(high Dose 65+) 04/25/2019, 04/14/2020, 04/23/2021, 06/01/2022   Influenza Split 05/03/2012, 05/01/2014   Influenza Whole 05/09/2017   Influenza, High Dose Seasonal PF 04/13/2016, 03/30/2018   Influenza,inj,Quad PF,6+ Mos 05/13/2015   PFIZER(Purple Top)SARS-COV-2 Vaccination 08/16/2019, 09/06/2019, 03/31/2020   Pneumococcal Conjugate-13 04/17/2014   Pneumococcal Polysaccharide-23 05/24/2016   Td 04/17/2014   Zoster, Live 05/02/2010   Screening Tests Health  Maintenance  Topic Date Due   COVID-19 Vaccine (4 - Pfizer risk series) 06/17/2022 (Originally 05/26/2020)   Zoster Vaccines- Shingrix (1 of 2) 09/01/2022 (Originally 06/07/1954)   Medicare Annual Wellness (AWV)  06/04/2023   TETANUS/TDAP  04/17/2024   Pneumonia Vaccine 32+ Years old  Completed   INFLUENZA VACCINE  Completed   HPV VACCINES  Aged Out   Health Maintenance There are no preventive care reminders to display for this patient.  Lung Cancer Screening: (Low Dose CT Chest recommended if Age 58-80 years, 30 pack-year currently smoking OR have quit w/in 15years.) does not qualify.   Hepatitis C Screening: does not qualify.  Vision Screening: Recommended annual ophthalmology exams for early detection of glaucoma and other disorders of the eye.  Dental Screening: Recommended annual dental exams for proper oral hygiene.  Community Resource Referral / Chronic Care Management: CRR required this visit?  No   CCM required this visit?  No      Plan:     I have personally reviewed and noted the following in the patient's chart:   Medical and social history Use of alcohol, tobacco or illicit drugs  Current medications and supplements including opioid prescriptions. Patient is currently taking opioid prescriptions. Information provided to patient regarding non-opioid alternatives. Patient advised to discuss non-opioid treatment plan with their provider. Functional ability and status Nutritional status Physical activity Advanced directives List of other physicians Hospitalizations, surgeries, and ER visits in previous 12 months Vitals Screenings to include cognitive, depression, and falls Referrals and appointments  In addition, I have reviewed and discussed with patient certain preventive protocols, quality metrics, and best practice recommendations. A written personalized care plan for preventive services as well as general preventive health recommendations were provided to  patient.     Leta Jungling, LPN   16/08/958

## 2022-06-04 ENCOUNTER — Encounter: Payer: Self-pay | Admitting: Internal Medicine

## 2022-06-04 NOTE — Assessment & Plan Note (Signed)
Low carb diet and exercise.  Follow met b and a1c.  

## 2022-06-04 NOTE — Assessment & Plan Note (Signed)
Low cholesterol diet and exercise.  Crestor.  Follow lipid panel and liver function tests.   

## 2022-06-04 NOTE — Assessment & Plan Note (Signed)
Increased weakness reported.  No significant focal finding on exam.  Hard for him to stand - using arms to help get up.  Does not walk around a lot.  Has walker.  Discussed importance of strengthening legs/core.  Difficulty with eating.  Feels right arm is weaker. Discussed home health  PT/OT.  Agreeable.

## 2022-06-04 NOTE — Assessment & Plan Note (Signed)
Continues on avapro.  Avoid antiinflammatories.  Follow metabolic panel. Renal ultrasound - no acute abnormality.

## 2022-06-04 NOTE — Assessment & Plan Note (Signed)
S/p prostatectomy.  Followed by Dr Bernardo Heater.

## 2022-06-04 NOTE — Assessment & Plan Note (Signed)
Taking hydralazine, avapro and amlodipine.  Taking hydralazine to '50mg'$  bid.  Follow pressures. Follow metabolic panel.

## 2022-06-04 NOTE — Assessment & Plan Note (Signed)
Followed by pain clinic.  

## 2022-06-04 NOTE — Assessment & Plan Note (Signed)
On thyroid replacement.  Follow tsh.  

## 2022-06-07 DIAGNOSIS — I10 Essential (primary) hypertension: Secondary | ICD-10-CM | POA: Diagnosis not present

## 2022-06-07 DIAGNOSIS — E039 Hypothyroidism, unspecified: Secondary | ICD-10-CM | POA: Diagnosis not present

## 2022-06-07 DIAGNOSIS — R739 Hyperglycemia, unspecified: Secondary | ICD-10-CM | POA: Diagnosis not present

## 2022-06-07 DIAGNOSIS — E538 Deficiency of other specified B group vitamins: Secondary | ICD-10-CM | POA: Diagnosis not present

## 2022-06-07 DIAGNOSIS — Z87442 Personal history of urinary calculi: Secondary | ICD-10-CM | POA: Diagnosis not present

## 2022-06-07 DIAGNOSIS — E7800 Pure hypercholesterolemia, unspecified: Secondary | ICD-10-CM | POA: Diagnosis not present

## 2022-06-07 DIAGNOSIS — M47816 Spondylosis without myelopathy or radiculopathy, lumbar region: Secondary | ICD-10-CM | POA: Diagnosis not present

## 2022-06-07 DIAGNOSIS — N183 Chronic kidney disease, stage 3 unspecified: Secondary | ICD-10-CM | POA: Diagnosis not present

## 2022-06-07 DIAGNOSIS — Z85820 Personal history of malignant melanoma of skin: Secondary | ICD-10-CM | POA: Diagnosis not present

## 2022-06-07 DIAGNOSIS — I129 Hypertensive chronic kidney disease with stage 1 through stage 4 chronic kidney disease, or unspecified chronic kidney disease: Secondary | ICD-10-CM | POA: Diagnosis not present

## 2022-06-07 DIAGNOSIS — M6281 Muscle weakness (generalized): Secondary | ICD-10-CM | POA: Diagnosis not present

## 2022-06-09 ENCOUNTER — Telehealth: Payer: Self-pay

## 2022-06-09 DIAGNOSIS — Z87442 Personal history of urinary calculi: Secondary | ICD-10-CM | POA: Diagnosis not present

## 2022-06-09 DIAGNOSIS — M47816 Spondylosis without myelopathy or radiculopathy, lumbar region: Secondary | ICD-10-CM | POA: Diagnosis not present

## 2022-06-09 DIAGNOSIS — R739 Hyperglycemia, unspecified: Secondary | ICD-10-CM | POA: Diagnosis not present

## 2022-06-09 DIAGNOSIS — E538 Deficiency of other specified B group vitamins: Secondary | ICD-10-CM | POA: Diagnosis not present

## 2022-06-09 DIAGNOSIS — M6281 Muscle weakness (generalized): Secondary | ICD-10-CM | POA: Diagnosis not present

## 2022-06-09 DIAGNOSIS — Z85820 Personal history of malignant melanoma of skin: Secondary | ICD-10-CM | POA: Diagnosis not present

## 2022-06-09 DIAGNOSIS — I129 Hypertensive chronic kidney disease with stage 1 through stage 4 chronic kidney disease, or unspecified chronic kidney disease: Secondary | ICD-10-CM | POA: Diagnosis not present

## 2022-06-09 DIAGNOSIS — E039 Hypothyroidism, unspecified: Secondary | ICD-10-CM | POA: Diagnosis not present

## 2022-06-09 DIAGNOSIS — N183 Chronic kidney disease, stage 3 unspecified: Secondary | ICD-10-CM | POA: Diagnosis not present

## 2022-06-09 DIAGNOSIS — E7800 Pure hypercholesterolemia, unspecified: Secondary | ICD-10-CM | POA: Diagnosis not present

## 2022-06-09 NOTE — Telephone Encounter (Signed)
Received call from [facility] occupational therapist requesting verbal orders for [facility] aide 1 wk x1 & 2 wk x5. [employer] direct [phone]

## 2022-06-10 NOTE — Telephone Encounter (Signed)
Home health orders received via fax - filled out - placed in form folder in box

## 2022-06-11 DIAGNOSIS — R739 Hyperglycemia, unspecified: Secondary | ICD-10-CM | POA: Diagnosis not present

## 2022-06-11 DIAGNOSIS — M47816 Spondylosis without myelopathy or radiculopathy, lumbar region: Secondary | ICD-10-CM | POA: Diagnosis not present

## 2022-06-11 DIAGNOSIS — I129 Hypertensive chronic kidney disease with stage 1 through stage 4 chronic kidney disease, or unspecified chronic kidney disease: Secondary | ICD-10-CM | POA: Diagnosis not present

## 2022-06-11 DIAGNOSIS — E538 Deficiency of other specified B group vitamins: Secondary | ICD-10-CM | POA: Diagnosis not present

## 2022-06-11 DIAGNOSIS — Z87442 Personal history of urinary calculi: Secondary | ICD-10-CM | POA: Diagnosis not present

## 2022-06-11 DIAGNOSIS — N183 Chronic kidney disease, stage 3 unspecified: Secondary | ICD-10-CM | POA: Diagnosis not present

## 2022-06-11 DIAGNOSIS — E039 Hypothyroidism, unspecified: Secondary | ICD-10-CM | POA: Diagnosis not present

## 2022-06-11 DIAGNOSIS — M6281 Muscle weakness (generalized): Secondary | ICD-10-CM | POA: Diagnosis not present

## 2022-06-11 DIAGNOSIS — E7800 Pure hypercholesterolemia, unspecified: Secondary | ICD-10-CM | POA: Diagnosis not present

## 2022-06-11 DIAGNOSIS — Z85820 Personal history of malignant melanoma of skin: Secondary | ICD-10-CM | POA: Diagnosis not present

## 2022-06-12 NOTE — Telephone Encounter (Signed)
Signed.

## 2022-06-14 DIAGNOSIS — R739 Hyperglycemia, unspecified: Secondary | ICD-10-CM | POA: Diagnosis not present

## 2022-06-14 DIAGNOSIS — M6281 Muscle weakness (generalized): Secondary | ICD-10-CM | POA: Diagnosis not present

## 2022-06-14 DIAGNOSIS — Z87442 Personal history of urinary calculi: Secondary | ICD-10-CM | POA: Diagnosis not present

## 2022-06-14 DIAGNOSIS — I129 Hypertensive chronic kidney disease with stage 1 through stage 4 chronic kidney disease, or unspecified chronic kidney disease: Secondary | ICD-10-CM | POA: Diagnosis not present

## 2022-06-14 DIAGNOSIS — Z85820 Personal history of malignant melanoma of skin: Secondary | ICD-10-CM | POA: Diagnosis not present

## 2022-06-14 DIAGNOSIS — E538 Deficiency of other specified B group vitamins: Secondary | ICD-10-CM | POA: Diagnosis not present

## 2022-06-14 DIAGNOSIS — N183 Chronic kidney disease, stage 3 unspecified: Secondary | ICD-10-CM | POA: Diagnosis not present

## 2022-06-14 DIAGNOSIS — E7800 Pure hypercholesterolemia, unspecified: Secondary | ICD-10-CM | POA: Diagnosis not present

## 2022-06-14 DIAGNOSIS — M47816 Spondylosis without myelopathy or radiculopathy, lumbar region: Secondary | ICD-10-CM | POA: Diagnosis not present

## 2022-06-14 DIAGNOSIS — E039 Hypothyroidism, unspecified: Secondary | ICD-10-CM | POA: Diagnosis not present

## 2022-06-15 ENCOUNTER — Encounter: Payer: Self-pay | Admitting: Internal Medicine

## 2022-06-15 DIAGNOSIS — M6281 Muscle weakness (generalized): Secondary | ICD-10-CM | POA: Diagnosis not present

## 2022-06-15 DIAGNOSIS — E7800 Pure hypercholesterolemia, unspecified: Secondary | ICD-10-CM | POA: Diagnosis not present

## 2022-06-15 DIAGNOSIS — Z85820 Personal history of malignant melanoma of skin: Secondary | ICD-10-CM | POA: Diagnosis not present

## 2022-06-15 DIAGNOSIS — E039 Hypothyroidism, unspecified: Secondary | ICD-10-CM | POA: Diagnosis not present

## 2022-06-15 DIAGNOSIS — N183 Chronic kidney disease, stage 3 unspecified: Secondary | ICD-10-CM | POA: Diagnosis not present

## 2022-06-15 DIAGNOSIS — I129 Hypertensive chronic kidney disease with stage 1 through stage 4 chronic kidney disease, or unspecified chronic kidney disease: Secondary | ICD-10-CM | POA: Diagnosis not present

## 2022-06-15 DIAGNOSIS — R739 Hyperglycemia, unspecified: Secondary | ICD-10-CM | POA: Diagnosis not present

## 2022-06-15 DIAGNOSIS — Z87442 Personal history of urinary calculi: Secondary | ICD-10-CM | POA: Diagnosis not present

## 2022-06-15 DIAGNOSIS — N1831 Chronic kidney disease, stage 3a: Secondary | ICD-10-CM

## 2022-06-15 DIAGNOSIS — E538 Deficiency of other specified B group vitamins: Secondary | ICD-10-CM | POA: Diagnosis not present

## 2022-06-15 DIAGNOSIS — M47816 Spondylosis without myelopathy or radiculopathy, lumbar region: Secondary | ICD-10-CM | POA: Diagnosis not present

## 2022-06-16 ENCOUNTER — Other Ambulatory Visit: Payer: Self-pay

## 2022-06-16 DIAGNOSIS — E538 Deficiency of other specified B group vitamins: Secondary | ICD-10-CM | POA: Diagnosis not present

## 2022-06-16 DIAGNOSIS — M47816 Spondylosis without myelopathy or radiculopathy, lumbar region: Secondary | ICD-10-CM | POA: Diagnosis not present

## 2022-06-16 DIAGNOSIS — Z87442 Personal history of urinary calculi: Secondary | ICD-10-CM | POA: Diagnosis not present

## 2022-06-16 DIAGNOSIS — R739 Hyperglycemia, unspecified: Secondary | ICD-10-CM | POA: Diagnosis not present

## 2022-06-16 DIAGNOSIS — I129 Hypertensive chronic kidney disease with stage 1 through stage 4 chronic kidney disease, or unspecified chronic kidney disease: Secondary | ICD-10-CM | POA: Diagnosis not present

## 2022-06-16 DIAGNOSIS — M6281 Muscle weakness (generalized): Secondary | ICD-10-CM | POA: Diagnosis not present

## 2022-06-16 DIAGNOSIS — Z85820 Personal history of malignant melanoma of skin: Secondary | ICD-10-CM | POA: Diagnosis not present

## 2022-06-16 DIAGNOSIS — N183 Chronic kidney disease, stage 3 unspecified: Secondary | ICD-10-CM | POA: Diagnosis not present

## 2022-06-16 DIAGNOSIS — E7800 Pure hypercholesterolemia, unspecified: Secondary | ICD-10-CM | POA: Diagnosis not present

## 2022-06-16 DIAGNOSIS — E039 Hypothyroidism, unspecified: Secondary | ICD-10-CM | POA: Diagnosis not present

## 2022-06-16 MED ORDER — GABAPENTIN 100 MG PO CAPS: 100.0000 mg | ORAL_CAPSULE | Freq: Every day | ORAL | 1 refills | Status: DC

## 2022-06-16 NOTE — Telephone Encounter (Signed)
Order placed for referral.  

## 2022-06-16 NOTE — Telephone Encounter (Signed)
He was previously on gabapentin '100mg'$  q hs - per review of medication.  Confirm not taking anything more at night - to sleep, etc.  If desires to start, I can send in prescription.

## 2022-06-17 DIAGNOSIS — I129 Hypertensive chronic kidney disease with stage 1 through stage 4 chronic kidney disease, or unspecified chronic kidney disease: Secondary | ICD-10-CM | POA: Diagnosis not present

## 2022-06-17 DIAGNOSIS — Z85820 Personal history of malignant melanoma of skin: Secondary | ICD-10-CM | POA: Diagnosis not present

## 2022-06-17 DIAGNOSIS — E538 Deficiency of other specified B group vitamins: Secondary | ICD-10-CM | POA: Diagnosis not present

## 2022-06-17 DIAGNOSIS — R739 Hyperglycemia, unspecified: Secondary | ICD-10-CM | POA: Diagnosis not present

## 2022-06-17 DIAGNOSIS — M6281 Muscle weakness (generalized): Secondary | ICD-10-CM | POA: Diagnosis not present

## 2022-06-17 DIAGNOSIS — E7800 Pure hypercholesterolemia, unspecified: Secondary | ICD-10-CM | POA: Diagnosis not present

## 2022-06-17 DIAGNOSIS — N183 Chronic kidney disease, stage 3 unspecified: Secondary | ICD-10-CM | POA: Diagnosis not present

## 2022-06-17 DIAGNOSIS — E039 Hypothyroidism, unspecified: Secondary | ICD-10-CM | POA: Diagnosis not present

## 2022-06-17 DIAGNOSIS — Z87442 Personal history of urinary calculi: Secondary | ICD-10-CM | POA: Diagnosis not present

## 2022-06-17 DIAGNOSIS — M47816 Spondylosis without myelopathy or radiculopathy, lumbar region: Secondary | ICD-10-CM | POA: Diagnosis not present

## 2022-06-21 ENCOUNTER — Ambulatory Visit: Payer: Medicare Other

## 2022-06-22 DIAGNOSIS — M47816 Spondylosis without myelopathy or radiculopathy, lumbar region: Secondary | ICD-10-CM | POA: Diagnosis not present

## 2022-06-22 DIAGNOSIS — E538 Deficiency of other specified B group vitamins: Secondary | ICD-10-CM | POA: Diagnosis not present

## 2022-06-22 DIAGNOSIS — Z87442 Personal history of urinary calculi: Secondary | ICD-10-CM | POA: Diagnosis not present

## 2022-06-22 DIAGNOSIS — M6281 Muscle weakness (generalized): Secondary | ICD-10-CM | POA: Diagnosis not present

## 2022-06-22 DIAGNOSIS — I129 Hypertensive chronic kidney disease with stage 1 through stage 4 chronic kidney disease, or unspecified chronic kidney disease: Secondary | ICD-10-CM | POA: Diagnosis not present

## 2022-06-22 DIAGNOSIS — E039 Hypothyroidism, unspecified: Secondary | ICD-10-CM | POA: Diagnosis not present

## 2022-06-22 DIAGNOSIS — Z85820 Personal history of malignant melanoma of skin: Secondary | ICD-10-CM | POA: Diagnosis not present

## 2022-06-22 DIAGNOSIS — N183 Chronic kidney disease, stage 3 unspecified: Secondary | ICD-10-CM | POA: Diagnosis not present

## 2022-06-22 DIAGNOSIS — E7800 Pure hypercholesterolemia, unspecified: Secondary | ICD-10-CM | POA: Diagnosis not present

## 2022-06-22 DIAGNOSIS — R739 Hyperglycemia, unspecified: Secondary | ICD-10-CM | POA: Diagnosis not present

## 2022-06-23 DIAGNOSIS — E7800 Pure hypercholesterolemia, unspecified: Secondary | ICD-10-CM | POA: Diagnosis not present

## 2022-06-23 DIAGNOSIS — Z87442 Personal history of urinary calculi: Secondary | ICD-10-CM | POA: Diagnosis not present

## 2022-06-23 DIAGNOSIS — N183 Chronic kidney disease, stage 3 unspecified: Secondary | ICD-10-CM | POA: Diagnosis not present

## 2022-06-23 DIAGNOSIS — M47816 Spondylosis without myelopathy or radiculopathy, lumbar region: Secondary | ICD-10-CM | POA: Diagnosis not present

## 2022-06-23 DIAGNOSIS — E039 Hypothyroidism, unspecified: Secondary | ICD-10-CM | POA: Diagnosis not present

## 2022-06-23 DIAGNOSIS — M6281 Muscle weakness (generalized): Secondary | ICD-10-CM | POA: Diagnosis not present

## 2022-06-23 DIAGNOSIS — I129 Hypertensive chronic kidney disease with stage 1 through stage 4 chronic kidney disease, or unspecified chronic kidney disease: Secondary | ICD-10-CM | POA: Diagnosis not present

## 2022-06-23 DIAGNOSIS — E538 Deficiency of other specified B group vitamins: Secondary | ICD-10-CM | POA: Diagnosis not present

## 2022-06-23 DIAGNOSIS — Z85820 Personal history of malignant melanoma of skin: Secondary | ICD-10-CM | POA: Diagnosis not present

## 2022-06-23 DIAGNOSIS — R739 Hyperglycemia, unspecified: Secondary | ICD-10-CM | POA: Diagnosis not present

## 2022-06-25 DIAGNOSIS — Z87442 Personal history of urinary calculi: Secondary | ICD-10-CM | POA: Diagnosis not present

## 2022-06-25 DIAGNOSIS — R739 Hyperglycemia, unspecified: Secondary | ICD-10-CM | POA: Diagnosis not present

## 2022-06-25 DIAGNOSIS — E7800 Pure hypercholesterolemia, unspecified: Secondary | ICD-10-CM | POA: Diagnosis not present

## 2022-06-25 DIAGNOSIS — M6281 Muscle weakness (generalized): Secondary | ICD-10-CM | POA: Diagnosis not present

## 2022-06-25 DIAGNOSIS — E538 Deficiency of other specified B group vitamins: Secondary | ICD-10-CM | POA: Diagnosis not present

## 2022-06-25 DIAGNOSIS — I129 Hypertensive chronic kidney disease with stage 1 through stage 4 chronic kidney disease, or unspecified chronic kidney disease: Secondary | ICD-10-CM | POA: Diagnosis not present

## 2022-06-25 DIAGNOSIS — E039 Hypothyroidism, unspecified: Secondary | ICD-10-CM | POA: Diagnosis not present

## 2022-06-25 DIAGNOSIS — Z85820 Personal history of malignant melanoma of skin: Secondary | ICD-10-CM | POA: Diagnosis not present

## 2022-06-25 DIAGNOSIS — N183 Chronic kidney disease, stage 3 unspecified: Secondary | ICD-10-CM | POA: Diagnosis not present

## 2022-06-25 DIAGNOSIS — M47816 Spondylosis without myelopathy or radiculopathy, lumbar region: Secondary | ICD-10-CM | POA: Diagnosis not present

## 2022-06-26 DIAGNOSIS — N183 Chronic kidney disease, stage 3 unspecified: Secondary | ICD-10-CM | POA: Diagnosis not present

## 2022-06-26 DIAGNOSIS — R739 Hyperglycemia, unspecified: Secondary | ICD-10-CM | POA: Diagnosis not present

## 2022-06-26 DIAGNOSIS — M6281 Muscle weakness (generalized): Secondary | ICD-10-CM | POA: Diagnosis not present

## 2022-06-26 DIAGNOSIS — Z87442 Personal history of urinary calculi: Secondary | ICD-10-CM | POA: Diagnosis not present

## 2022-06-26 DIAGNOSIS — I129 Hypertensive chronic kidney disease with stage 1 through stage 4 chronic kidney disease, or unspecified chronic kidney disease: Secondary | ICD-10-CM | POA: Diagnosis not present

## 2022-06-26 DIAGNOSIS — E039 Hypothyroidism, unspecified: Secondary | ICD-10-CM | POA: Diagnosis not present

## 2022-06-26 DIAGNOSIS — Z85820 Personal history of malignant melanoma of skin: Secondary | ICD-10-CM | POA: Diagnosis not present

## 2022-06-26 DIAGNOSIS — M47816 Spondylosis without myelopathy or radiculopathy, lumbar region: Secondary | ICD-10-CM | POA: Diagnosis not present

## 2022-06-26 DIAGNOSIS — E7800 Pure hypercholesterolemia, unspecified: Secondary | ICD-10-CM | POA: Diagnosis not present

## 2022-06-26 DIAGNOSIS — E538 Deficiency of other specified B group vitamins: Secondary | ICD-10-CM | POA: Diagnosis not present

## 2022-06-28 DIAGNOSIS — Z87442 Personal history of urinary calculi: Secondary | ICD-10-CM | POA: Diagnosis not present

## 2022-06-28 DIAGNOSIS — E7800 Pure hypercholesterolemia, unspecified: Secondary | ICD-10-CM | POA: Diagnosis not present

## 2022-06-28 DIAGNOSIS — M47816 Spondylosis without myelopathy or radiculopathy, lumbar region: Secondary | ICD-10-CM | POA: Diagnosis not present

## 2022-06-28 DIAGNOSIS — N183 Chronic kidney disease, stage 3 unspecified: Secondary | ICD-10-CM | POA: Diagnosis not present

## 2022-06-28 DIAGNOSIS — R739 Hyperglycemia, unspecified: Secondary | ICD-10-CM | POA: Diagnosis not present

## 2022-06-28 DIAGNOSIS — Z85820 Personal history of malignant melanoma of skin: Secondary | ICD-10-CM | POA: Diagnosis not present

## 2022-06-28 DIAGNOSIS — M6281 Muscle weakness (generalized): Secondary | ICD-10-CM | POA: Diagnosis not present

## 2022-06-28 DIAGNOSIS — I129 Hypertensive chronic kidney disease with stage 1 through stage 4 chronic kidney disease, or unspecified chronic kidney disease: Secondary | ICD-10-CM | POA: Diagnosis not present

## 2022-06-28 DIAGNOSIS — E039 Hypothyroidism, unspecified: Secondary | ICD-10-CM | POA: Diagnosis not present

## 2022-06-28 DIAGNOSIS — E538 Deficiency of other specified B group vitamins: Secondary | ICD-10-CM | POA: Diagnosis not present

## 2022-06-29 DIAGNOSIS — R739 Hyperglycemia, unspecified: Secondary | ICD-10-CM | POA: Diagnosis not present

## 2022-06-29 DIAGNOSIS — E039 Hypothyroidism, unspecified: Secondary | ICD-10-CM | POA: Diagnosis not present

## 2022-06-29 DIAGNOSIS — Z87442 Personal history of urinary calculi: Secondary | ICD-10-CM | POA: Diagnosis not present

## 2022-06-29 DIAGNOSIS — E538 Deficiency of other specified B group vitamins: Secondary | ICD-10-CM | POA: Diagnosis not present

## 2022-06-29 DIAGNOSIS — E7800 Pure hypercholesterolemia, unspecified: Secondary | ICD-10-CM | POA: Diagnosis not present

## 2022-06-29 DIAGNOSIS — N183 Chronic kidney disease, stage 3 unspecified: Secondary | ICD-10-CM | POA: Diagnosis not present

## 2022-06-29 DIAGNOSIS — I129 Hypertensive chronic kidney disease with stage 1 through stage 4 chronic kidney disease, or unspecified chronic kidney disease: Secondary | ICD-10-CM | POA: Diagnosis not present

## 2022-06-29 DIAGNOSIS — Z85820 Personal history of malignant melanoma of skin: Secondary | ICD-10-CM | POA: Diagnosis not present

## 2022-06-29 DIAGNOSIS — M6281 Muscle weakness (generalized): Secondary | ICD-10-CM | POA: Diagnosis not present

## 2022-06-29 DIAGNOSIS — M47816 Spondylosis without myelopathy or radiculopathy, lumbar region: Secondary | ICD-10-CM | POA: Diagnosis not present

## 2022-06-30 DIAGNOSIS — E538 Deficiency of other specified B group vitamins: Secondary | ICD-10-CM | POA: Diagnosis not present

## 2022-06-30 DIAGNOSIS — Z87442 Personal history of urinary calculi: Secondary | ICD-10-CM | POA: Diagnosis not present

## 2022-06-30 DIAGNOSIS — M47816 Spondylosis without myelopathy or radiculopathy, lumbar region: Secondary | ICD-10-CM | POA: Diagnosis not present

## 2022-06-30 DIAGNOSIS — Z85820 Personal history of malignant melanoma of skin: Secondary | ICD-10-CM | POA: Diagnosis not present

## 2022-06-30 DIAGNOSIS — E7800 Pure hypercholesterolemia, unspecified: Secondary | ICD-10-CM | POA: Diagnosis not present

## 2022-06-30 DIAGNOSIS — R739 Hyperglycemia, unspecified: Secondary | ICD-10-CM | POA: Diagnosis not present

## 2022-06-30 DIAGNOSIS — M6281 Muscle weakness (generalized): Secondary | ICD-10-CM | POA: Diagnosis not present

## 2022-06-30 DIAGNOSIS — N183 Chronic kidney disease, stage 3 unspecified: Secondary | ICD-10-CM | POA: Diagnosis not present

## 2022-06-30 DIAGNOSIS — I129 Hypertensive chronic kidney disease with stage 1 through stage 4 chronic kidney disease, or unspecified chronic kidney disease: Secondary | ICD-10-CM | POA: Diagnosis not present

## 2022-06-30 DIAGNOSIS — E039 Hypothyroidism, unspecified: Secondary | ICD-10-CM | POA: Diagnosis not present

## 2022-07-01 DIAGNOSIS — E039 Hypothyroidism, unspecified: Secondary | ICD-10-CM | POA: Diagnosis not present

## 2022-07-01 DIAGNOSIS — N183 Chronic kidney disease, stage 3 unspecified: Secondary | ICD-10-CM | POA: Diagnosis not present

## 2022-07-01 DIAGNOSIS — E7800 Pure hypercholesterolemia, unspecified: Secondary | ICD-10-CM | POA: Diagnosis not present

## 2022-07-01 DIAGNOSIS — M47816 Spondylosis without myelopathy or radiculopathy, lumbar region: Secondary | ICD-10-CM | POA: Diagnosis not present

## 2022-07-01 DIAGNOSIS — Z87442 Personal history of urinary calculi: Secondary | ICD-10-CM | POA: Diagnosis not present

## 2022-07-01 DIAGNOSIS — Z85820 Personal history of malignant melanoma of skin: Secondary | ICD-10-CM | POA: Diagnosis not present

## 2022-07-01 DIAGNOSIS — R739 Hyperglycemia, unspecified: Secondary | ICD-10-CM | POA: Diagnosis not present

## 2022-07-01 DIAGNOSIS — M6281 Muscle weakness (generalized): Secondary | ICD-10-CM | POA: Diagnosis not present

## 2022-07-01 DIAGNOSIS — I129 Hypertensive chronic kidney disease with stage 1 through stage 4 chronic kidney disease, or unspecified chronic kidney disease: Secondary | ICD-10-CM | POA: Diagnosis not present

## 2022-07-01 DIAGNOSIS — E538 Deficiency of other specified B group vitamins: Secondary | ICD-10-CM | POA: Diagnosis not present

## 2022-07-07 ENCOUNTER — Other Ambulatory Visit: Payer: Self-pay | Admitting: Internal Medicine

## 2022-07-08 ENCOUNTER — Ambulatory Visit: Payer: Medicare Other

## 2022-08-04 ENCOUNTER — Encounter: Payer: Self-pay | Admitting: Pain Medicine

## 2022-08-04 ENCOUNTER — Ambulatory Visit: Payer: Medicare Other | Attending: Pain Medicine | Admitting: Pain Medicine

## 2022-08-04 VITALS — BP 156/84 | HR 63 | Temp 97.9°F | Resp 16 | Ht 68.0 in | Wt 250.0 lb

## 2022-08-04 DIAGNOSIS — G8929 Other chronic pain: Secondary | ICD-10-CM

## 2022-08-04 DIAGNOSIS — Z79891 Long term (current) use of opiate analgesic: Secondary | ICD-10-CM | POA: Diagnosis not present

## 2022-08-04 DIAGNOSIS — F1120 Opioid dependence, uncomplicated: Secondary | ICD-10-CM

## 2022-08-04 DIAGNOSIS — M47816 Spondylosis without myelopathy or radiculopathy, lumbar region: Secondary | ICD-10-CM

## 2022-08-04 DIAGNOSIS — Z79899 Other long term (current) drug therapy: Secondary | ICD-10-CM | POA: Diagnosis not present

## 2022-08-04 DIAGNOSIS — M5137 Other intervertebral disc degeneration, lumbosacral region: Secondary | ICD-10-CM | POA: Diagnosis not present

## 2022-08-04 DIAGNOSIS — M79605 Pain in left leg: Secondary | ICD-10-CM

## 2022-08-04 DIAGNOSIS — M5442 Lumbago with sciatica, left side: Secondary | ICD-10-CM

## 2022-08-04 DIAGNOSIS — G894 Chronic pain syndrome: Secondary | ICD-10-CM

## 2022-08-04 MED ORDER — TRAMADOL HCL 50 MG PO TABS: 50.0000 mg | ORAL_TABLET | Freq: Three times a day (TID) | ORAL | 5 refills | Status: DC

## 2022-08-04 NOTE — Patient Instructions (Signed)
____________________________________________________________________________________________  National Pain Medication Shortage  The [place] is experiencing worsening drug shortages. These have had a negative widespread effect on patient care and treatment. Not expected to improve any time soon. Predicted to last past 2029.   Drug shortage list (generic names) Oxycodone IR Oxycodone/APAP Oxymorphone IR Hydromorphone Hydrocodone/APAP Morphine  Where is the problem?  Manufacturing and supply level.  Will this shortage affect you?  Only if you take any of the above pain medications.  How? You may be unable to fill your prescription.  Your pharmacist may offer a "partial fill" of your prescription. (Warning: Do not accept partial fills.) Prescriptions partially filled cannot be transferred to another pharmacy. Read our Medication Rules and Regulation. Depending on how much medicine you are dependent on, you may experience withdrawals when unable to get the medication.  Recommendations: Consider ending your dependence on opioid pain medications. Ask your pain specialist to assist you with the process. Consider switching to a medication currently not in shortage, such as Buprenorphine. Talk to your pain specialist about this option. Consider decreasing your pain medication requirements by managing tolerance thru "Drug Holidays". This may help minimize withdrawals, should you run out of medicine. Control your pain thru the use of non-pharmacological interventional therapies.   Your prescriber: Prescribers cannot be blamed for shortages. Medication manufacturing and supply issues cannot be fixed by the prescriber.   NOTE: The prescriber is not responsible for supplying the medication, or solving supply issues. Work with your pharmacist to solve it. The patient is responsible for the decision to take or continue taking the medication and for identifying and securing a legal supply source. By  law, supplying the medication is the job and responsibility of the pharmacy. The prescriber is responsible for the evaluation, monitoring, and prescribing of these medications.   Prescribers will NOT: Re-issue prescriptions that have been partially filled. Re-issue prescriptions already sent to a pharmacy.  Re-send prescriptions to a different pharmacy because yours did not have your medication. Ask pharmacist to order more medicine or transfer the prescription to another pharmacy. (Read below.)  New 2023 regulation: "April 02, 2022 Revised Regulation Allows DEA-Registered Pharmacies to Transfer Electronic Prescriptions at a Patient's Request DEA Headquarters Division - Public Information Office Patients now have the ability to request their electronic prescription be transferred to another pharmacy without having to go back to their practitioner to initiate the request. This revised regulation went into effect on Monday, March 29, 2022.     At a patient's request, a DEA-registered retail pharmacy can now transfer an electronic prescription for a controlled substance (schedules II-V) to another DEA-registered retail pharmacy. Prior to this change, patients would have to go through their practitioner to cancel their prescription and have it re-issued to a different pharmacy. The process was taxing and time consuming for both patients and practitioners.    The Drug Enforcement Administration (DEA) published its intent to revise the process for transferring electronic prescriptions on June 20, 2020.  The final rule was published in the federal register on February 25, 2022 and went into effect 30 days later.  Under the final rule, a prescription can only be transferred once between pharmacies, and only if allowed under existing state or other applicable law. The prescription must remain in its electronic form; may not be altered in any way; and the transfer must be communicated directly between  two licensed pharmacists. It's important to note, any authorized refills transfer with the original prescription, which means the entire prescription will   be filled at the same pharmacy".  Reference: [email or web address] [facility] website announcement)  [email or web address] ([place])   [employer] / Vol. 88, No. 143 / Thursday, February 25, 2022 / Rules and Regulations DEPARTMENT OF JUSTICE  Drug Enforcement Administration  21 CFR Part 1306  [Docket No. [id number]]  RIN [id number] Transfer of Electronic Prescriptions for Schedules II-V Controlled Substances Between Pharmacies for Initial Filling  ____________________________________________________________________________________________     ____________________________________________________________________________________________  Drug Holidays  What is a "Drug Holiday"? Drug Holiday: is the name given to the process of slowly tapering down and temporarily stopping the pain medication for the purpose of decreasing or eliminating tolerance to the drug.  Benefits Improved effectiveness Decreased required effective dose Improved pain control End dependence on high dose therapy Decrease cost of therapy Uncovering "opioid-induced hyperalgesia". (OIH)  What is "opioid hyperalgesia"? It is a paradoxical increase in pain caused by exposure to opioids. Stopping the opioid pain medication, contrary to the expected, it actually decreases or completely eliminates the pain. Ref.: "A comprehensive review of opioid-induced hyperalgesia". Brion Aliment, et.al. Pain Physician. 2011 Mar-Apr;14(2):145-61.  What is tolerance? Tolerance: the progressive loss of effectiveness of a pain medicine due to repetitive use. A common problem of opioid pain medications.  How long should a "Drug  Holiday" last? Effectiveness depends on the patient staying off all opioid pain medicines for a minimum of 14 consecutive days. (2 weeks)  How about just taking less of the medicine? Does not work. Will not accomplish goal of eliminating the excess receptors.  How about switching to a different pain medicine? (AKA. "Opioid rotation") Does not work. Creates the illusion of effectiveness by taking advantage of inaccurate equivalent dose calculations between different opioids. -This "technique" was promoted by studies funded by [employer], such as [employer], creators of "OxyContin".  Can I stop the medicine "cold [place]"? Depends. You should always coordinate with your Pain Specialist to make the transition as smoothly as possible. Avoid stopping the medicine abruptly without consulting. We recommend a "slow taper".  What is a slow taper? Taper: refers to the gradual decrease in dose.   How do I stop/taper the dose? Slowly. Decrease the daily amount of pills that you take by one (1) pill every seven (7) days. This is called a "slow downward taper". Example: if you normally take four (4) pills per day, drop it to three (3) pills per day for seven (7) days, then to two (2) pills per day for seven (7) days, then to one (1) per day for seven (7) days, and then stop the medicine. The 14 day "Drug Holiday" starts on the first day without medicine.   Will I experience withdrawals? Unlikely with a slow taper.  What triggers withdrawals? Withdrawals are triggered by the sudden/abrupt stop of high dose opioids. Withdrawals can be avoided by slowly decreasing the dose over a prolonged period of time.  What are withdrawals? Symptoms associated with sudden/abrupt reduction/stopping of high-dose, long-term use of pain medication. Withdrawal are seldom seen on low dose therapy, or patients rarely taking opioid medication.  Early Withdrawal Symptoms may include: Agitation Anxiety Muscle  aches Increased tearing Insomnia Runny nose Sweating Yawning  Late symptoms may include: Abdominal cramping Diarrhea Dilated pupils Goose bumps Nausea Vomiting  (Last update: 07/11/2022) ____________________________________________________________________________________________    _______________________________________________________________________  Medication Rules  Purpose: To inform patients, and their family members, of our medication rules and regulations.  Applies to: All patients receiving prescriptions from our practice (written or electronic).  Pharmacy of record: This is the pharmacy where your electronic prescriptions will be sent. Make sure we have the correct one.  Electronic prescriptions: In compliance with the Gate (STOP) Act of 2017 (Session Lanny Cramp [phone]), effective August 02, 2018, all controlled substances must be electronically prescribed. Written prescriptions, faxing, or calling prescriptions to a pharmacy will no longer be done.  Prescription refills: These will be provided only during in-person appointments. No medications will be renewed without a "face-to-face" evaluation with your provider. Applies to all prescriptions.  NOTE: The following applies primarily to controlled substances (Opioid* Pain Medications).   Type of encounter (visit): For patients receiving controlled substances, face-to-face visits are required. (Not an option and not up to the patient.)  Patient's responsibilities: Pain Pills: Bring all pain pills to every appointment (except for procedure appointments). Pill Bottles: Bring pills in original pharmacy bottle. Bring bottle, even if empty. Always bring the bottle of the most recent fill.  Medication refills: You are responsible for knowing and keeping track of what medications you are taking and when is it that you will need a refill. The day before your appointment: write a  list of all prescriptions that need to be refilled. The day of the appointment: give the list to the admitting nurse. Prescriptions will be written only during appointments. No prescriptions will be written on procedure days. If you forget a medication: it will not be "Called in", "Faxed", or "electronically sent". You will need to get another appointment to get these prescribed. No early refills. Do not call asking to have your prescription filled early. Partial  or short prescriptions: Occasionally your pharmacy may not have enough pills to fill your prescription.  NEVER ACCEPT a partial fill or a prescription that is short of the total amount of pills that you were prescribed.  With controlled substances the law allows 72 hours for the pharmacy to complete the prescription.  If the prescription is not completed within 72 hours, the pharmacist will require a new prescription to be written. This means that you will be short on your medicine and we WILL NOT send another prescription to complete your original prescription.  Instead, request the pharmacy to send a carrier to a nearby branch to get enough medication to provide you with your full prescription. Prescription Accuracy: You are responsible for carefully inspecting your prescriptions before leaving our office. Have the discharge nurse carefully go over each prescription with you, before taking them home. Make sure that your name is accurately spelled, that your address is correct. Check the name and dose of your medication to make sure it is accurate. Check the number of pills, and the written instructions to make sure they are clear and accurate. Make sure that you are given enough medication to last until your next medication refill appointment. Taking Medication: Take medication as prescribed. When it comes to controlled substances, taking less pills or less frequently than prescribed is permitted and encouraged. Never take more pills than  instructed. Never take the medication more frequently than prescribed.  Inform other Doctors: Always inform, all of your healthcare providers, of all the medications you take. Pain Medication from other Providers: You are not allowed to accept any additional pain medication from any other Doctor or Healthcare provider. There are two exceptions to this rule. (see below) In the event that you require additional pain medication, you are responsible for notifying us, as stated below. Cough Medicine: Often these contain an opioid, such  as codeine or hydrocodone. Never accept or take cough medicine containing these opioids if you are already taking an opioid* medication. The combination may cause respiratory failure and death. Medication Agreement: You are responsible for carefully reading and following our Medication Agreement. This must be signed before receiving any prescriptions from our practice. Safely store a copy of your signed Agreement. Violations to the Agreement will result in no further prescriptions. (Additional copies of our Medication Agreement are available upon request.) Laws, Rules, & Regulations: All patients are expected to follow all Federal and [employer], Rules, [employer]. Ignorance of the Laws does not constitute a valid excuse.  Illegal drugs and Controlled Substances: The use of illegal substances (including, but not limited to marijuana and its derivatives) and/or the illegal use of any controlled substances is strictly prohibited. Violation of this rule may result in the immediate and permanent discontinuation of any and all prescriptions being written by our practice. The use of any illegal substances is prohibited. Adopted CDC guidelines & recommendations: Target dosing levels will be at or below 60 MME/day. Use of benzodiazepines** is not recommended.  Exceptions: There are only two exceptions to the rule of not receiving pain medications from other Healthcare  Providers. Exception #1 (Emergencies): In the event of an emergency (i.e.: accident requiring emergency care), you are allowed to receive additional pain medication. However, you are responsible for: As soon as you are able, call our office [phone], at any time of the day or night, and leave a message stating your name, the date and nature of the emergency, and the name and dose of the medication prescribed. In the event that your call is answered by a member of our staff, make sure to document and save the date, time, and the name of the person that took your information.  Exception #2 (Planned Surgery): In the event that you are scheduled by another doctor or dentist to have any type of surgery or procedure, you are allowed (for a period no longer than 30 days), to receive additional pain medication, for the acute post-op pain. However, in this case, you are responsible for picking up a copy of our "Post-op Pain Management for Surgeons" handout, and giving it to your surgeon or dentist. This document is available at our office, and does not require an appointment to obtain it. Simply go to our office during business hours (Monday-Thursday from 8:00 AM to 4:00 PM) (Friday 8:00 AM to 12:00 Noon) or if you have a scheduled appointment with [place], prior to your surgery, and ask for it by name. In addition, you are responsible for: calling our office [phone], at any time of the day or night, and leaving a message stating your name, name of your surgeon, type of surgery, and date of procedure or surgery. Failure to comply with your responsibilities may result in termination of therapy involving the controlled substances. Medication Agreement Violation. Following the above rules, including your responsibilities will help you in avoiding a Medication Agreement Violation ("Breaking your Pain Medication Contract").  Consequences:  Not following the above rules may result in permanent discontinuation of  medication prescription therapy.  *Opioid medications include: morphine, codeine, oxycodone, oxymorphone, hydrocodone, hydromorphone, meperidine, tramadol, tapentadol, buprenorphine, fentanyl, methadone. **Benzodiazepine medications include: diazepam (Valium), alprazolam (Xanax), clonazepam (Klonopine), lorazepam (Ativan), clorazepate (Tranxene), chlordiazepoxide (Librium), estazolam (Prosom), oxazepam (Serax), temazepam (Restoril), triazolam (Halcion) (Last updated: 05/25/2022) ______________________________________________________________________    ______________________________________________________________________  Medication Recommendations and Reminders  Applies to: All patients receiving prescriptions (  written and/or electronic).  Medication Rules & Regulations: You are responsible for reading, knowing, and following our "Medication Rules" document. These exist for your safety and that of others. They are not flexible and neither are we. Dismissing or ignoring them is an act of "non-compliance" that may result in complete and irreversible termination of such medication therapy. For safety reasons, "non-compliance" will not be tolerated. As with the [place] fundamental legal principle of "ignorance of the law is no defense", we will accept no excuses for not having read and knowing the content of documents provided to you by our practice.  Pharmacy of record:  Definition: This is the pharmacy where your electronic prescriptions will be sent.  We do not endorse any particular pharmacy. It is up to you and your insurance to decide what pharmacy to use.  We do not restrict you in your choice of pharmacy. However, once we write for your prescriptions, we will NOT be re-sending more prescriptions to fix restricted supply problems created by your pharmacy, or your insurance.  The pharmacy listed in the electronic medical record should be the one where you want electronic prescriptions to be  sent. If you choose to change pharmacy, simply notify our nursing staff. Changes will be made only during your regular appointments and not over the phone.  Recommendations: Keep all of your pain medications in a safe place, under lock and key, even if you live alone. We will NOT replace lost, stolen, or damaged medication. We do not accept "Police Reports" as proof of medications having been stolen. After you fill your prescription, take 1 week's worth of pills and put them away in a safe place. You should keep a separate, properly labeled bottle for this purpose. The remainder should be kept in the original bottle. Use this as your primary supply, until it runs out. Once it's gone, then you know that you have 1 week's worth of medicine, and it is time to come in for a prescription refill. If you do this correctly, it is unlikely that you will ever run out of medicine. To make sure that the above recommendation works, it is very important that you make sure your medication refill appointments are scheduled at least 1 week before you run out of medicine. To do this in an effective manner, make sure that you do not leave the office without scheduling your next medication management appointment. Always ask the nursing staff to show you in your prescription , when your medication will be running out. Then arrange for the receptionist to get you a return appointment, at least 7 days before you run out of medicine. Do not wait until you have 1 or 2 pills left, to come in. This is very poor planning and does not take into consideration that we may need to cancel appointments due to bad weather, sickness, or emergencies affecting our staff. DO NOT ACCEPT A "Partial Fill": If for any reason your pharmacy does not have enough pills/tablets to completely fill or refill your prescription, do not allow for a "partial fill". The law allows the pharmacy to complete that prescription within 72 hours, without requiring a new  prescription. If they do not fill the rest of your prescription within those 72 hours, you will need a separate prescription to fill the remaining amount, which we will NOT provide. If the reason for the partial fill is your insurance, you will need to talk to the pharmacist about payment alternatives for the remaining tablets, but  again, DO NOT ACCEPT A PARTIAL FILL, unless you can trust your pharmacist to obtain the remainder of the pills within 72 hours.  Prescription refills and/or changes in medication(s):  Prescription refills, and/or changes in dose or medication, will be conducted only during scheduled medication management appointments. (Applies to both, written and electronic prescriptions.) No refills on procedure days. No medication will be changed or started on procedure days. No changes, adjustments, and/or refills will be conducted on a procedure day. Doing so will interfere with the diagnostic portion of the procedure. No phone refills. No medications will be "called into the pharmacy". No Fax refills. No weekend refills. No Holliday refills. No after hours refills.  Remember:  Business hours are:  Monday to Thursday 8:00 AM to 4:00 PM Provider's Schedule: Milinda Pointer, MD - Appointments are:  Medication management: Monday and Wednesday 8:00 AM to 4:00 PM Procedure day: Tuesday and Thursday 7:30 AM to 4:00 PM Gillis Santa, MD - Appointments are:  Medication management: Tuesday and Thursday 8:00 AM to 4:00 PM Procedure day: Monday and Wednesday 7:30 AM to 4:00 PM (Last update: 05/25/2022) ______________________________________________________________________    ____________________________________________________________________________________________  WARNING: CBD (cannabidiol) & Delta (Delta-8 tetrahydrocannabinol) products.   Applicable to:  All individuals currently taking or considering taking CBD (cannabidiol) and, more important, all patients taking opioid  analgesic controlled substances (pain medication). (Example: oxycodone; oxymorphone; hydrocodone; hydromorphone; morphine; methadone; tramadol; tapentadol; fentanyl; buprenorphine; butorphanol; dextromethorphan; meperidine; codeine; etc.)  Introduction:  Recently there has been a drive towards the use of "natural" products for the treatment of different conditions, including pain anxiety and sleep disorders. Marijuana and hemp are two varieties of the cannabis genus plants. Marijuana and its derivatives are illegal, while hemp and its derivatives are not. Cannabidiol (CBD) and tetrahydrocannabinol (THC), are two natural compounds found in plants of the Cannabis genus. They can both be extracted from hemp or marijuana. Both compounds interact with your body's endocannabinoid system in very different ways. CBD is associated with pain relief (analgesia) while THC is associated with the psychoactive effects ("the high") obtained from the use of marijuana products. There are two main types of THC: Delta-9, which comes from the marijuana plant and it is illegal, and Delta-8, which comes from the hemp plant, and it is legal. (Both, Delta-9-THC and Delta-8-THC are psychoactive and give you "the high".)   Legality:  Marijuana and its derivatives: illegal Hemp and its derivatives: Legal (State dependent) UPDATE: (09/18/2021) The Drug Enforcement Agency ([place]) issued a letter stating that "delta" cannabinoids, including Delta-8-THCO and Delta-9-THCO, synthetically derived from hemp do not qualify as hemp and will be viewed as Schedule I drugs. (Schedule I drugs, substances, or chemicals are defined as drugs with no currently accepted medical use and a high potential for abuse. Some examples of Schedule I drugs are: heroin, lysergic acid diethylamide (LSD), marijuana (cannabis), 3,4-methylenedioxymethamphetamine (ecstasy), methaqualone, and peyote.) (https://jennings.com/)  Legal status of CBD in [place]:  "Conditionally  Legal"  Reference: "FDA Regulation of Cannabis and Cannabis-Derived Products, Including Cannabidiol (CBD)" - [employer].pt  Warning:  CBD is not FDA approved and has not undergo the same manufacturing controls as prescription drugs.  This means that the purity and safety of available CBD may be questionable. Most of the time, despite manufacturer's claims, it is contaminated with THC (delta-9-tetrahydrocannabinol - the chemical in marijuana responsible for the "HIGH").  When this is the case, the [facility] contaminant will trigger a positive urine drug screen (UDS) test for Marijuana (carboxy-THC).   The FDA recently  put out a warning about 5 things that everyone should be aware of regarding Delta-8 THC: Delta-8 THC products have not been evaluated or approved by the FDA for safe use and may be marketed in ways that put the public health at risk. The FDA has received adverse event reports involving delta-8 THC-containing products. Delta-8 THC has psychoactive and intoxicating effects. Delta-8 THC manufacturing often involve use of potentially harmful chemicals to create the concentrations of delta-8 THC claimed in the marketplace. The final delta-8 THC product may have potentially harmful by-products (contaminants) due to the chemicals used in the process. Manufacturing of delta-8 THC products may occur in uncontrolled or unsanitary settings, which may lead to the presence of unsafe contaminants or other potentially harmful substances. Delta-8 THC products should be kept out of the reach of children and pets.  NOTE: Because a positive UDS for any illicit substance is a violation of our medication agreement, your opioid analgesics (pain medicine) may be permanently discontinued.  MORE ABOUT CBD  General Information: CBD was discovered in 40 and it is a derivative of the cannabis sativa  genus plants (Marijuana and Hemp). It is one of the 113 identified substances found in Marijuana. It accounts for up to 40% of the plant's extract. As of 2018, preliminary clinical studies on CBD included research for the treatment of anxiety, movement disorders, and pain. CBD is available and consumed in multiple forms, including inhalation of smoke or vapor, as an aerosol spray, and by mouth. It may be supplied as an oil containing CBD, capsules, dried cannabis, or as a liquid solution. CBD is thought not to be as psychoactive as THC (delta-9-tetrahydrocannabinol - the chemical in marijuana responsible for the "HIGH"). Studies suggest that CBD may interact with different biological target receptors in the body, including cannabinoid and other neurotransmitter receptors. As of 2018 the mechanism of action for its biological effects has not been determined.  Side-effects  Adverse reactions: Dry mouth, diarrhea, decreased appetite, fatigue, drowsiness, malaise, weakness, sleep disturbances, and others.  Drug interactions:  CBD may interact with medications such as blood-thinners. CBD causes drowsiness on its own and it will increase drowsiness caused by other medications, including antihistamines (such as Benadryl), benzodiazepines (Xanax, Ativan, Valium), antipsychotics, antidepressants, opioids, alcohol and supplements such as kava, melatonin and St. John's Wort.  Other drug interactions: Brivaracetam (Briviact); Caffeine; Carbamazepine (Tegretol); Citalopram (Celexa); Clobazam (Onfi); Eslicarbazepine (Aptiom); Everolimus (Zostress); Lithium; Methadone (Dolophine); Rufinamide (Banzel); Sedative medications (CNS depressants); Sirolimus (Rapamune); Stiripentol (Diacomit); Tacrolimus (Prograf); Tamoxifen ; Soltamox); Topiramate (Topamax); Valproate; Warfarin (Coumadin); Zonisamide. (Last update: 07/12/2022) ____________________________________________________________________________________________    ____________________________________________________________________________________________  Naloxone Nasal Spray  Why am I receiving this medication? [place] STOP ACT requires that all patients taking high dose opioids or at risk of opioids respiratory depression, be prescribed an opioid reversal agent, such as Naloxone (AKA: Narcan).  What is this medication? NALOXONE (nal OX one) treats opioid overdose, which causes slow or shallow breathing, severe drowsiness, or trouble staying awake. Call emergency services after using this medication. You may need additional treatment. Naloxone works by reversing the effects of opioids. It belongs to a group of medications called opioid blockers.  COMMON BRAND NAME(S): Kloxxado, Narcan  What should I tell my care team before I take this medication? They need to know if you have any of these conditions: Heart disease Substance use disorder An unusual or allergic reaction to naloxone, other medications, foods, dyes, or preservatives Pregnant or trying to get pregnant Breast-feeding  When to use this  medication? This medication is to be used for the treatment of respiratory depression (less than 8 breaths per minute) secondary to opioid overdose.   How to use this medication? This medication is for use in the nose. Lay the person on their back. Support their neck with your hand and allow the head to tilt back before giving the medication. The nasal spray should be given into 1 nostril. After giving the medication, move the person onto their side. Do not remove or test the nasal spray until ready to use. Get emergency medical help right away after giving the first dose of this medication, even if the person wakes up. You should be familiar with how to recognize the signs and symptoms of a narcotic overdose. If more doses are needed, give the additional dose in the other nostril. Talk to your care team about the use of this medication in children.  While this medication may be prescribed for children as young as newborns for selected conditions, precautions do apply.  Naloxone Overdosage: If you think you have taken too much of this medicine contact a poison control center or emergency room at once.  NOTE: This medicine is only for you. Do not share this medicine with others.  What if I miss a dose? This does not apply.  What may interact with this medication? This is only used during an emergency. No interactions are expected during emergency use. This list may not describe all possible interactions. Give your health care provider a list of all the medicines, herbs, non-prescription drugs, or dietary supplements you use. Also tell them if you smoke, drink alcohol, or use illegal drugs. Some items may interact with your medicine.  What should I watch for while using this medication? Keep this medication ready for use in the case of an opioid overdose. Make sure that you have the phone number of your care team and local hospital ready. You may need to have additional doses of this medication. Each nasal spray contains a single dose. Some emergencies may require additional doses. After use, bring the treated person to the nearest hospital or call 911. Make sure the treating care team knows that the person has received a dose of this medication. You will receive additional instructions on what to do during and after use of this medication before an emergency occurs.  What side effects may I notice from receiving this medication? Side effects that you should report to your care team as soon as possible: Allergic reactions--skin rash, itching, hives, swelling of the face, lips, tongue, or throat Side effects that usually do not require medical attention (report these to your care team if they continue or are bothersome): Constipation Dryness or irritation inside the nose Headache Increase in blood pressure Muscle spasms Stuffy  nose Toothache This list may not describe all possible side effects. Call your doctor for medical advice about side effects. You may report side effects to FDA at 1-800-FDA-1088.  Where should I keep my medication? Because this is an emergency medication, you should keep it with you at all times.  Keep out of the reach of children and pets. Store between 20 and 25 degrees C (68 and 77 degrees F). Do not freeze. Throw away any unused medication after the expiration date. Keep in original box until ready to use.  NOTE: This sheet is a summary. It may not cover all possible information. If you have questions about this medicine, talk to your doctor, pharmacist, or health care provider.  2023 Elsevier/Gold Standard (2021-03-27 00:00:00)  ____________________________________________________________________________________________   ____________________________________________________________________________________________  Patient Information update  To: All of our patients.  Re: Name change.  It has been made official that our current name, "[facility]"   will soon be changed to "[facility]".   The purpose of this change is to eliminate any confusion created by the concept of our practice being a "Medication Management Pain [facility]". In the past this has led to the misconception that we treat pain primarily by the use of prescription medications.  Nothing can be farther from the truth.   Understanding PAIN MANAGEMENT: To further understand what our practice does, you first have to understand that "Pain Management" is a subspecialty that requires additional training once a physician has completed their specialty training, which can be in either Anesthesia, Neurology, Psychiatry, or Physical Medicine and Rehabilitation (PMR). Each one of these contributes to the final approach taken  by each physician to the management of their patient's pain. To be a "Pain Management Specialist" you must have first completed one of the specialty trainings below.  Anesthesiologists - trained in clinical pharmacology and interventional techniques such as nerve blockade and regional as well as central neuroanatomy. They are trained to block pain before, during, and after surgical interventions.  Neurologists - trained in the diagnosis and pharmacological treatment of complex neurological conditions, such as Multiple Sclerosis, Parkinson's, spinal cord injuries, and other systemic conditions that may be associated with symptoms that may include but are not limited to pain. They tend to rely primarily on the treatment of chronic pain using prescription medications.  Psychiatrist - trained in conditions affecting the psychosocial wellbeing of patients including but not limited to depression, anxiety, schizophrenia, personality disorders, addiction, and other substance use disorders that may be associated with chronic pain. They tend to rely primarily on the treatment of chronic pain using prescription medications.   Physical Medicine and Rehabilitation (PMR) physicians, also known as physiatrists - trained to treat a wide variety of medical conditions affecting the brain, spinal cord, nerves, bones, joints, ligaments, muscles, and tendons. Their training is primarily aimed at treating patients that have suffered injuries that have caused severe physical impairment. Their training is primarily aimed at the physical therapy and rehabilitation of those patients. They may also work alongside orthopedic surgeons or neurosurgeons using their expertise in assisting surgical patients to recover after their surgeries.  INTERVENTIONAL PAIN MANAGEMENT is sub-subspecialty of Pain Management.  Our physicians are Board-certified in Anesthesia, Pain Management, and Interventional Pain Management.  This meaning that not  only have they been trained and Board-certified in their specialty of Anesthesia, and subspecialty of Pain Management, but they have also received further training in the sub-subspecialty of Interventional Pain Management, in order to become Board-certified as INTERVENTIONAL PAIN MANAGEMENT SPECIALIST.    Mission: Our goal is to use our skills in  [place] as alternatives to the chronic use of prescription opioid medications for the treatment of pain. To make this more clear, we have changed our name to reflect what we do and offer. We will continue to offer medication management assessment and recommendations, but we will not be taking over any patient's medication management.  ____________________________________________________________________________________________

## 2022-08-04 NOTE — Progress Notes (Signed)
Nursing Pain Medication Assessment:  Safety precautions to be maintained throughout the outpatient stay will include: orient to surroundings, keep bed in low position, maintain call bell within reach at all times, provide assistance with transfer out of bed and ambulation.  Medication Inspection Compliance: Pill count conducted under aseptic conditions, in front of the patient. Neither the pills nor the bottle was removed from the patient's sight at any time. Once count was completed pills were immediately returned to the patient in their original bottle.  Medication: Tramadol (Ultram) Pill/Patch Count:  18 of 90 pills remain Pill/Patch Appearance: Markings consistent with prescribed medication Bottle Appearance: Standard pharmacy container. Clearly labeled. Filled Date: 75 / 06 / 2023 Last Medication intake:  Today

## 2022-08-04 NOTE — Progress Notes (Signed)
PROVIDER NOTE: Information contained herein reflects review and annotations entered in association with encounter. Interpretation of such information and data should be left to medically-trained personnel. Information provided to patient can be located elsewhere in the medical record under "Patient Instructions". Document created using STT-dictation technology, any transcriptional errors that may result from process are unintentional.    Patient: Hector [name]  Service Category: E/M  Provider: Gaspar Cola, MD  DOB: 06-21-1935  DOS: 08/04/2022  Referring Provider: Einar Pheasant, MD  MRN: [id number]  Specialty: Interventional Pain Management  PCP: Einar Pheasant, MD  Type: Established Patient  Setting: Ambulatory outpatient    Location: Office  Delivery: Face-to-face     HPI  [name], a 88 y.o. year old male, is here today because of his Chronic pain syndrome [G89.4]. Hector [name] primary complain today is Back Pain (lower) Last encounter: My last encounter with him was on 05/27/2022. Pertinent problems: Hector [name] has History of prostate cancer; Shoulder pain, left; Chronic hip pain (Left); Chronic low back pain (1ry area of Pain) (Bilateral) (L>R) w/ sciatica (Left); Chronic pain syndrome; Polyarthritis, unspecified (Secondary Area of Pain); L5-S1 bilateral pars defect with spondylolisthesis; Lumbosacral Grade 1 Anterolisthesis of L5 over S1; Lumbar facet arthropathy (Bilateral); Lumbar foraminal stenosis (L5-S1) (Bilateral) (L>R); Chronic Lumbar radiculitis (L5) (Left); Chronic musculoskeletal pain; Lumbar facet syndrome (Bilateral) (L>R); Chronic lower extremity pain (2ry area of Pain) (Left); Chronic lower extremity radicular pain (L5) (Left); Lumbar facet osteoarthritis (Bilateral); Osteoarthritis of lumbar spine; Lumbar spondylosis; Spondylosis without myelopathy or radiculopathy, lumbosacral region; Chronic right shoulder pain; DDD (degenerative disc disease), lumbosacral; Gout; Chronic  low back pain (Bilateral) w/o sciatica; Neurogenic pain; Abnormal MRI, lumbar spine (08/22/2021); Numbness and tingling of lower extremity (Right); Lumbosacral radiculopathy at S1 (Right); Chronic lower extremity pain (Right); Chronic low back pain (Bilateral) w/ sciatica (Right); and Right sided weakness on their pertinent problem list. Pain Assessment: Severity of Chronic pain is reported as a 3 /10. Location: Back Lower/right leg. Onset: More than a month ago. Quality: Sharp. Timing: Constant. Modifying factor(s): sitting, Tramadol, Gabapentin. Vitals:  height is _0  (1.727 m) and weight is 250 lb (113.4 kg). His temporal temperature is 97.9 F (36.6 C). His blood pressure is 156/84 (abnormal) and his pulse is 63. His respiration is 16 and oxygen saturation is 97%.  BMI: Estimated body mass index is 38.01 kg/m as calculated from the following:   Height as of this encounter: _1  (1.727 m).   Weight as of this encounter: 250 lb (113.4 kg).  Reason for encounter: medication management.  The patient indicates doing well with the current medication regimen. No adverse reactions or side effects reported to the medications.   BMP: 6/6 refills on prescription written on 01/27/2022 filled.  Last: 07/07/2022.  RTCB: 02/03/2023  Nonopioids transferred 05/07/2020: Calcium and vitamin D3  Pharmacotherapy Assessment  Analgesic: Tramadol 50 mg, 1 tab PO q 8 hrs (150 mg/day of tramadol) MME/day: 15 mg/day.   Monitoring: [place] PMP: PDMP reviewed during this encounter.       Pharmacotherapy: No side-effects or adverse reactions reported. Compliance: No problems identified. Effectiveness: Clinically acceptable.  Landis Martins, RN  08/04/2022 12:53 PM  Sign when Signing Visit Nursing Pain Medication Assessment:  Safety precautions to be maintained throughout the outpatient stay will include: orient to surroundings, keep bed in low position, maintain call bell within reach at all times, provide assistance with  transfer out of bed and ambulation.  Medication Inspection Compliance: Pill  count conducted under aseptic conditions, in front of the patient. Neither the pills nor the bottle was removed from the patient's sight at any time. Once count was completed pills were immediately returned to the patient in their original bottle.  Medication: Tramadol (Ultram) Pill/Patch Count:  18 of 90 pills remain Pill/Patch Appearance: Markings consistent with prescribed medication Bottle Appearance: Standard pharmacy container. Clearly labeled. Filled Date: 46 / 06 / 2023 Last Medication intake:  Today    No results found for: "CBDTHCR" No results found for: "D8THCCBX" No results found for: "D9THCCBX"  UDS:  Summary  Date Value Ref Range Status  01/27/2022 Note  Final    Comment:    ==================================================================== [employer] Select 13 (MW) ==================================================================== Test                             Result       Flag       Units  Drug Present and Declared for Prescription Verification   Tramadol                       >1767        EXPECTED   ng/mg creat   O-Desmethyltramadol            745          EXPECTED   ng/mg creat   N-Desmethyltramadol            >1767        EXPECTED   ng/mg creat    Source of tramadol is a prescription medication. O-desmethyltramadol    and N-desmethyltramadol are expected metabolites of tramadol.  ==================================================================== Test                      Result    Flag   Units      Ref Range   Creatinine              283              mg/dL      >=20 ==================================================================== Declared Medications:  The flagging and interpretation on this report are based on the  following declared medications.  Unexpected results may arise from  inaccuracies in the declared medications.   **Note: The testing scope of this panel  includes these medications:   Tramadol (Ultram)   **Note: The testing scope of this panel does not include the  following reported medications:   Acetaminophen (Tylenol)  Allopurinol (Zyloprim)  Amlodipine (Norvasc)  Aspirin  Calcium  Fluticasone (Flonase)  Hydralazine (Apresoline)  Irbesartan (Avapro)  Levothyroxine (Synthroid)  Lysine  Omeprazole (Prilosec)  Rosuvastatin (Crestor)  Sennosides (Senokot)  Valacyclovir (Valtrex)  Vitamin D ==================================================================== For clinical consultation, please call [phone]. ====================================================================       ROS  Constitutional: Denies any fever or chills Gastrointestinal: No reported hemesis, hematochezia, vomiting, or acute GI distress Musculoskeletal: Denies any acute onset joint swelling, redness, loss of ROM, or weakness Neurological: No reported episodes of acute onset apraxia, aphasia, dysarthria, agnosia, amnesia, paralysis, loss of coordination, or loss of consciousness  Medication Review  Lysine, Vitamin D, acetaminophen, allopurinol, amLODipine, aspirin, fluticasone, gabapentin, hydrALAZINE, irbesartan, levothyroxine, omeprazole, rosuvastatin, senna, traMADol, and valACYclovir  History Review  Allergy: Hector [name] is allergic to tape. Drug: Hector [name]  reports no history of drug use. Alcohol:  reports no history of alcohol use. Tobacco:  reports that he has never smoked.  He has never used smokeless tobacco. Social: Hector [name]  reports that he has never smoked. He has never used smokeless tobacco. He reports that he does not drink alcohol and does not use drugs. Medical:  has a past medical history of Acute postoperative pain (03/21/2018), Anemia, Arthritis, Barrett esophagus, Cancer ([place]), Chicken pox, Diverticulitis, Dysrhythmia, GERD (gastroesophageal reflux disease), Hyperlipidemia, Hypertension, Hypothyroidism, Melanoma ([place]), Sleep apnea,  and Ulcer. Surgical: Hector [name]  has a past surgical history that includes Prostate surgery; cystocopy (2003); Hemorrhoid surgery; Cardiac catheterization; sleep study; Colon surgery (2006-2008-2011); Cataract Surgery (Right, 02/13/14); Esophagogastroduodenoscopy (egd) with propofol (N/A, 08/04/2016); Colonoscopy w/ polypectomy; Colonoscopy with propofol (N/A, 02/15/2018); Colonoscopy with propofol (N/A, 03/24/2020); Esophagogastroduodenoscopy (egd) with propofol (N/A, 03/24/2020); and Cystoscopy/retrograde/ureteroscopy/stone extraction with basket (Right, 10/28/2020). Family: family history includes Breast cancer in his daughter; Heart disease in his father; Kidney disease in his father; Liver disease in his mother.  Laboratory Chemistry Profile   Renal Lab Results  Component Value Date   BUN 25 (H) 06/01/2022   CREATININE 1.74 (H) 06/01/2022   BCR 10 06/13/2017   GFR 35.44 (L) 06/01/2022   GFRAA >60 01/23/2018   GFRNONAA 38 (L) 10/18/2020    Hepatic Lab Results  Component Value Date   AST 26 06/01/2022   ALT 30 06/01/2022   ALBUMIN 4.2 06/01/2022   ALKPHOS 61 06/01/2022   LIPASE 27 10/18/2020    Electrolytes Lab Results  Component Value Date   NA 138 06/01/2022   K 4.5 06/01/2022   CL 102 06/01/2022   CALCIUM 9.6 06/01/2022   MG 2.0 06/13/2017    Bone Lab Results  Component Value Date   VD25OH 33.78 04/06/2022   25OHVITD1 20 (L) 06/13/2017   25OHVITD2 <1.0 06/13/2017   25OHVITD3 20 06/13/2017    Inflammation (CRP: Acute Phase) (ESR: Chronic Phase) Lab Results  Component Value Date   CRP 2.8 06/13/2017   ESRSEDRATE 13 06/13/2017         Note: Above Lab results reviewed.  Recent Imaging Review  DG PAIN CLINIC C-ARM 1-60 MIN NO REPORT Fluoro was used, but no Radiologist interpretation will be provided.  Please refer to "NOTES" tab for provider progress note. Note: Reviewed        Physical Exam  General appearance: Well nourished, well developed, and well hydrated. In no  apparent acute distress Mental status: Alert, oriented x 3 (person, place, & time)       Respiratory: No evidence of acute respiratory distress Eyes: PERLA Vitals: BP (!) 156/84   Pulse 63   Temp 97.9 F (36.6 C) (Temporal)   Resp 16   Ht _0  (1.727 m)   Wt 250 lb (113.4 kg)   SpO2 97%   BMI 38.01 kg/m  BMI: Estimated body mass index is 38.01 kg/m as calculated from the following:   Height as of this encounter: _1  (1.727 m).   Weight as of this encounter: 250 lb (113.4 kg). Ideal: Ideal body weight: 68.4 kg (150 lb 12.7 oz) Adjusted ideal body weight: 86.4 kg (190 lb 7.6 oz)  Assessment   Diagnosis Status  1. Chronic pain syndrome   2. Chronic low back pain (1ry area of Pain) (Bilateral) (L>R) w/ sciatica (Left)   3. Chronic lower extremity pain (2ry area of Pain) (Left)   4. DDD (degenerative disc disease), lumbosacral   5. Lumbar facet arthropathy (Bilateral)   6. Uncomplicated opioid dependence (HCC)   7. Lumbar facet syndrome (Bilateral) (L>R)   8. Pharmacologic therapy  9. Chronic use of opiate for therapeutic purpose   10. Encounter for medication management   11. Encounter for chronic pain management    Controlled Controlled Controlled   Updated Problems: No problems updated.  Plan of Care  Problem-specific:  No problem-specific Assessment & Plan notes found for this encounter.  Hector [name] has a current medication list which includes the following long-term medication(s): allopurinol, amlodipine, fluticasone, gabapentin, hydralazine, irbesartan, levothyroxine, omeprazole, rosuvastatin, and [START ON 08/07/2022] tramadol.  Pharmacotherapy (Medications Ordered): Meds ordered this encounter  Medications   traMADol (ULTRAM) 50 MG tablet    Sig: Take 1 tablet (50 mg total) by mouth in the morning, at noon, and at bedtime. Each refill must last 30 days.    Dispense:  90 tablet    Refill:  5    DO NOT: delete (not duplicate); no partial-fill (will deny  script to complete), no refill request (F/U required). DISPENSE: 1 day early if closed on fill date. WARN: No CNS-depressants within 8 hrs of med.   Orders:  No orders of the defined types were placed in this encounter.  Follow-up plan:   Return in about 6 months (around 02/03/2023) for Eval-day (M,W), (F2F), (MM).     Interventional Therapies  Risk  Complexity Considerations:   Estimated body mass index is 39.53 kg/m as calculated from the following:   Height as of this encounter: _0  (1.727 m).   Weight as of this encounter: 260 lb (117.9 kg). POOR Candidate for any more RFA due to movement and body habitus.   Planned  Pending:   Therapeutic right caudal ESI #1 (04/20/2022)    Under consideration:   Diagnostic/therapeutic bilateral L5 TFESI #1    Completed:   Diagnostic midline-left caudal ESI x3 (08/27/2021) (100/100/90/90)  Palliative right lumbar facet MBB (L2-S1) x3 (08/07/2020) (100/100/100/100)  Palliative left lumbar facet MBB (L2-S1) x2 (10/27/2017) (100/100/95/75-100)  Palliative right lumbar facet RFA (L2-S1) x1 (04/18/2018) (100/100/100/100)  Palliative left lumbar facet RFA (L2-S1) x1 (03/21/2018) (75/75/80/80)    Completed by other providers:   None at this time   Therapeutic  Palliative (PRN) options:   Diagnostic midline caudal ESI #3  Palliative right lumbar facet block #4  Palliative left lumbar facet block #3  Palliative right lumbar facet RFA #2  Palliative left lumbar facet RFA #2    Pharmacotherapy  Nonopioids transferred 05/07/2020: Calcium and vitamin D3      Recent Visits Date Type Provider Dept  05/27/22 Office Visit Milinda Pointer, MD [facility]  05/13/22 Procedure visit Milinda Pointer, MD [facility]  Showing recent visits within past 90 days and meeting all other requirements Today's Visits Date Type Provider Dept  08/04/22 Office Visit Milinda Pointer, MD [facility]  Showing today's visits and  meeting all other requirements Future Appointments No visits were found meeting these conditions. Showing future appointments within next 90 days and meeting all other requirements  I discussed the assessment and treatment plan with the patient. The patient was provided an opportunity to ask questions and all were answered. The patient agreed with the plan and demonstrated an understanding of the instructions.  Patient advised to call back or seek an in-person evaluation if the symptoms or condition worsens.  Duration of encounter: 30 minutes.  Total time on encounter, as per AMA guidelines included both the face-to-face and non-face-to-face time personally spent by the physician and/or other qualified health care professional(s) on the day of the encounter (includes time in  activities that require the physician or other qualified health care professional and does not include time in activities normally performed by clinical staff). Physician's time may include the following activities when performed: Preparing to see the patient (e.g., pre-charting review of records, searching for previously ordered imaging, lab work, and nerve conduction tests) Review of prior analgesic pharmacotherapies. Reviewing PMP Interpreting ordered tests (e.g., lab work, imaging, nerve conduction tests) Performing post-procedure evaluations, including interpretation of diagnostic procedures Obtaining and/or reviewing separately obtained history Performing a medically appropriate examination and/or evaluation Counseling and educating the patient/family/caregiver Ordering medications, tests, or procedures Referring and communicating with other health care professionals (when not separately reported) Documenting clinical information in the electronic or other health record Independently interpreting results (not separately reported) and communicating results to the patient/ family/caregiver Care coordination (not  separately reported)  Note by: Gaspar Cola, MD Date: 08/04/2022; Time: 1:00 PM

## 2022-08-06 ENCOUNTER — Other Ambulatory Visit: Payer: Self-pay | Admitting: Family

## 2022-08-20 ENCOUNTER — Encounter: Payer: Self-pay | Admitting: Internal Medicine

## 2022-08-20 ENCOUNTER — Ambulatory Visit (INDEPENDENT_AMBULATORY_CARE_PROVIDER_SITE_OTHER): Payer: Medicare Other | Admitting: Internal Medicine

## 2022-08-20 VITALS — BP 134/76 | HR 60 | Temp 97.6°F | Resp 16 | Ht 68.0 in | Wt 260.0 lb

## 2022-08-20 DIAGNOSIS — Z860100 Personal history of colon polyps, unspecified: Secondary | ICD-10-CM

## 2022-08-20 DIAGNOSIS — R739 Hyperglycemia, unspecified: Secondary | ICD-10-CM | POA: Diagnosis not present

## 2022-08-20 DIAGNOSIS — Z85820 Personal history of malignant melanoma of skin: Secondary | ICD-10-CM

## 2022-08-20 DIAGNOSIS — K2270 Barrett's esophagus without dysplasia: Secondary | ICD-10-CM | POA: Diagnosis not present

## 2022-08-20 DIAGNOSIS — E039 Hypothyroidism, unspecified: Secondary | ICD-10-CM | POA: Diagnosis not present

## 2022-08-20 DIAGNOSIS — R29898 Other symptoms and signs involving the musculoskeletal system: Secondary | ICD-10-CM

## 2022-08-20 DIAGNOSIS — R600 Localized edema: Secondary | ICD-10-CM

## 2022-08-20 DIAGNOSIS — Z8546 Personal history of malignant neoplasm of prostate: Secondary | ICD-10-CM

## 2022-08-20 DIAGNOSIS — M47816 Spondylosis without myelopathy or radiculopathy, lumbar region: Secondary | ICD-10-CM

## 2022-08-20 DIAGNOSIS — E7800 Pure hypercholesterolemia, unspecified: Secondary | ICD-10-CM

## 2022-08-20 DIAGNOSIS — N1831 Chronic kidney disease, stage 3a: Secondary | ICD-10-CM | POA: Diagnosis not present

## 2022-08-20 DIAGNOSIS — I10 Essential (primary) hypertension: Secondary | ICD-10-CM

## 2022-08-20 LAB — COMPREHENSIVE METABOLIC PANEL
ALT: 25 U/L (ref 0–53)
AST: 26 U/L (ref 0–37)
Albumin: 4.1 g/dL (ref 3.5–5.2)
Alkaline Phosphatase: 63 U/L (ref 39–117)
BUN: 17 mg/dL (ref 6–23)
CO2: 25 mEq/L (ref 19–32)
Calcium: 9.5 mg/dL (ref 8.4–10.5)
Chloride: 103 mEq/L (ref 96–112)
Creatinine, Ser: 1.34 mg/dL (ref 0.40–1.50)
GFR: 48.41 mL/min — ABNORMAL LOW (ref >60.00)
Glucose, Bld: 95 mg/dL (ref 70–99)
Potassium: 4.2 mEq/L (ref 3.5–5.1)
Sodium: 139 mEq/L (ref 135–145)
Total Bilirubin: 0.5 mg/dL (ref 0.2–1.2)
Total Protein: 6.8 g/dL (ref 6.0–8.3)

## 2022-08-20 LAB — HEMOGLOBIN A1C: Hgb A1c MFr Bld: 5.5 % (ref 4.6–6.5)

## 2022-08-20 LAB — LIPID PANEL
Cholesterol: 213 mg/dL — ABNORMAL HIGH (ref 0–200)
HDL: 51.8 mg/dL (ref >39.00)
NonHDL: 160.94
Total CHOL/HDL Ratio: 4
Triglycerides: 306 mg/dL — ABNORMAL HIGH (ref 0.0–149.0)
VLDL: 61.2 mg/dL — ABNORMAL HIGH (ref 0.0–40.0)

## 2022-08-20 LAB — LDL CHOLESTEROL, DIRECT: Direct LDL: 108 mg/dL

## 2022-08-20 MED ORDER — GABAPENTIN 100 MG PO CAPS: 100.0000 mg | ORAL_CAPSULE | Freq: Two times a day (BID) | ORAL | 1 refills | Status: DC

## 2022-08-20 MED ORDER — VALACYCLOVIR HCL 1 G PO TABS: 1000.0000 mg | ORAL_TABLET | Freq: Every day | ORAL | 1 refills | Status: DC

## 2022-08-20 MED ORDER — HYDRALAZINE HCL 50 MG PO TABS: 50.0000 mg | ORAL_TABLET | Freq: Two times a day (BID) | ORAL | 1 refills | Status: DC

## 2022-08-20 NOTE — Progress Notes (Signed)
Subjective:    [name], male    DOB: 04/11/1935, 88 y.o.   MRN: [id number]  Patient here for  Chief Complaint  Patient presents with   Medical Management of Chronic Issues    HPI Here to follow up regarding his blood pressure and cholesterol. He is accompanied by his daughter.  History obtained from both of them.  He is ambulating around his house with support.  Will "furniture walk".  Still some issues with moving his right leg, but feels overall stable - maybe a little better walking around.  Declines further testing.  No chest pain.  Breathing stable.  No abdominal pain reported.  Being followed by pain clinic. Feels gabapentin has helped some.  Would like to increase the dose. Was having issues with lower extremity swelling.  Has cut out sodium. Improved.      Past Medical History:  Diagnosis Date   Acute postoperative pain 03/21/2018   Anemia    Arthritis    Barrett esophagus    Cancer ([place])    prostate,skin   Chicken pox    Diverticulitis    Dysrhythmia    GERD (gastroesophageal reflux disease)    Hyperlipidemia    Hypertension    Hypothyroidism    Melanoma ([place])    Malignant resection   Sleep apnea    Ulcer    Past Surgical History:  Procedure Laterality Date   CARDIAC CATHETERIZATION     Cataract Surgery Right 02/13/14   COLON SURGERY  2006-2008-2011   polyps removed   COLONOSCOPY W/ POLYPECTOMY     COLONOSCOPY WITH PROPOFOL N/A 02/15/2018   Procedure: COLONOSCOPY WITH PROPOFOL;  Surgeon: Manya Silvas, MD;  Location: [facility] ENDOSCOPY;  Service: Endoscopy;  Laterality: N/A;   COLONOSCOPY WITH PROPOFOL N/A 03/24/2020   Procedure: COLONOSCOPY WITH PROPOFOL;  Surgeon: Lesly Rubenstein, MD;  Location: [facility] ENDOSCOPY;  Service: Endoscopy;  Laterality: N/A;   cystocopy  2003   CYSTOSCOPY/RETROGRADE/URETEROSCOPY/STONE EXTRACTION WITH BASKET Right 10/28/2020   Procedure: CYSTOSCOPY/RETROGRADE PYELOGRAM/STONE EXTRACTION;  Surgeon: Abbie Sons, MD;   Location: [facility] ORS;  Service: Urology;  Laterality: Right;   ESOPHAGOGASTRODUODENOSCOPY (EGD) WITH PROPOFOL N/A 08/04/2016   Procedure: ESOPHAGOGASTRODUODENOSCOPY (EGD) WITH PROPOFOL;  Surgeon: Manya Silvas, MD;  Location: [facility] ENDOSCOPY;  Service: Endoscopy;  Laterality: N/A;   ESOPHAGOGASTRODUODENOSCOPY (EGD) WITH PROPOFOL N/A 03/24/2020   Procedure: ESOPHAGOGASTRODUODENOSCOPY (EGD) WITH PROPOFOL;  Surgeon: Lesly Rubenstein, MD;  Location: [facility] ENDOSCOPY;  Service: Endoscopy;  Laterality: N/A;   HEMORRHOID SURGERY     PROSTATE SURGERY     sleep study     Family History  Problem Relation Age of Onset   Liver disease Mother    Heart disease Father    Kidney disease Father    Breast cancer Daughter    Social History   Socioeconomic History   Marital status: Widowed    Spouse name: Not on file   Number of children: Not on file   Years of education: Not on file   Highest education level: Not on file  Occupational History   Not on file  Tobacco Use   Smoking status: Never   Smokeless tobacco: Never  Vaping Use   Vaping Use: Never used  Substance and Sexual Activity   Alcohol use: No    Alcohol/week: 0.0 standard drinks of alcohol   Drug use: No   Sexual activity: Never  Other Topics Concern   Not on file  Social History Narrative  Not on file   Social Determinants of Health   Financial Resource Strain: Low Risk  (06/03/2022)   Overall Financial Resource Strain (CARDIA)    Difficulty of Paying Living Expenses: Not hard at all  Food Insecurity: No Food Insecurity (06/03/2022)   Hunger Vital Sign    Worried About Running Out of Food in the Last Year: Never true    Ran Out of Food in the Last Year: Never true  Transportation Needs: No Transportation Needs (06/03/2022)   PRAPARE - Hydrologist (Medical): No    Lack of Transportation (Non-Medical): No  Physical Activity: Unknown (06/03/2022)   Exercise Vital Sign    Days of Exercise per  Week: 0 days    Minutes of Exercise per Session: Not on file  Stress: No Stress Concern Present (06/01/2021)   [place]    Feeling of Stress : Not at all  Social Connections: Moderately Isolated (06/03/2022)   Social Connection and Isolation Panel [NHANES]    Frequency of Communication with Friends and Family: More than three times a week    Frequency of Social Gatherings with Friends and Family: More than three times a week    Attends Religious Services: More than 4 times per year    Active Member of [employer] or Organizations: No    Attends Archivist Meetings: Not on file    Marital Status: Widowed     Review of Systems  Constitutional:  Negative for appetite change and unexpected weight change.  HENT:  Negative for congestion and sinus pressure.   Respiratory:  Negative for cough and chest tightness.        Breathing stable.   Cardiovascular:  Negative for chest pain and palpitations.       Leg swelling improved.    Gastrointestinal:  Negative for abdominal pain, diarrhea, nausea and vomiting.  Genitourinary:  Negative for difficulty urinating and dysuria.  Musculoskeletal:  Positive for back pain. Negative for myalgias.  Skin:  Negative for color change and rash.  Neurological:  Negative for dizziness and headaches.  Psychiatric/Behavioral:  Negative for agitation and dysphoric mood.        Objective:     BP 134/76   Pulse 60   Temp 97.6 F (36.4 C)   Resp 16   Ht '5\' 8"'$  (1.727 m)   Wt 260 lb (117.9 kg)   SpO2 97%   BMI 39.53 kg/m  Wt Readings from Last 3 Encounters:  08/20/22 260 lb (117.9 kg)  08/04/22 250 lb (113.4 kg)  06/03/22 254 lb (115.2 kg)    Physical Exam Vitals reviewed.  Constitutional:      General: He is not in acute distress.    Appearance: Normal appearance. He is well-developed.  HENT:     Head: Normocephalic and atraumatic.     Right Ear: External ear normal.      Left Ear: External ear normal.  Eyes:     General: No scleral icterus.       Right eye: No discharge.        Left eye: No discharge.     Conjunctiva/sclera: Conjunctivae normal.  Cardiovascular:     Rate and Rhythm: Normal rate and regular rhythm.  Pulmonary:     Effort: Pulmonary effort is normal. No respiratory distress.     Breath sounds: Normal breath sounds.  Abdominal:     General: Bowel sounds are normal.  Palpations: Abdomen is soft.     Tenderness: There is no abdominal tenderness.  Musculoskeletal:        General: No tenderness.     Cervical back: Neck supple. No tenderness.     Comments: No increased swelling - stable.   Lymphadenopathy:     Cervical: No cervical adenopathy.  Skin:    Findings: No erythema or rash.  Neurological:     Mental Status: He is alert.  Psychiatric:        Mood and Affect: Mood normal.        Behavior: Behavior normal.      Outpatient Encounter Medications as of 08/20/2022  Medication Sig   acetaminophen (TYLENOL) 500 MG tablet Take 500 mg by mouth every 6 (six) hours as needed.   allopurinol (ZYLOPRIM) 100 MG tablet TAKE 1 TABLET BY MOUTH DAILY   amLODipine (NORVASC) 10 MG tablet TAKE 1 TABLET BY MOUTH DAILY   aspirin 81 MG tablet Take 81 mg by mouth daily.   fluticasone (FLONASE) 50 MCG/ACT nasal spray TWO PUFFS IN EACH NOSTRIL ONCE A DAY   gabapentin (NEURONTIN) 100 MG capsule Take 1 capsule (100 mg total) by mouth 2 (two) times daily.   hydrALAZINE (APRESOLINE) 50 MG tablet Take 1 tablet (50 mg total) by mouth 2 (two) times daily.   irbesartan (AVAPRO) 300 MG tablet Take 1 tablet (300 mg total) by mouth daily.   levothyroxine (SYNTHROID) 125 MCG tablet Take 1 tablet (125 mcg total) by mouth daily.   LYSINE PO Take 1 tablet by mouth daily.   omeprazole (PRILOSEC) 20 MG capsule TAKE ONE CAPSULE BY MOUTH TWICE A DAY   rosuvastatin (CRESTOR) 5 MG tablet TAKE 1 TABLET BY MOUTH DAILY   senna (SENOKOT) 8.6 MG TABS tablet Take 2 tablets  by mouth daily.   traMADol (ULTRAM) 50 MG tablet Take 1 tablet (50 mg total) by mouth in the morning, at noon, and at bedtime. Each refill must last 30 days.   valACYclovir (VALTREX) 1000 MG tablet Take 1 tablet (1,000 mg total) by mouth daily.   VITAMIN D PO Take 1 tablet by mouth daily.   [DISCONTINUED] gabapentin (NEURONTIN) 100 MG capsule Take 1 capsule (100 mg total) by mouth at bedtime.   [DISCONTINUED] hydrALAZINE (APRESOLINE) 50 MG tablet TAKE 1 TABLET BY MOUTH 3 TIMES DAILY (Patient taking differently: Take 50 mg by mouth 2 (two) times daily.)   [DISCONTINUED] valACYclovir (VALTREX) 1000 MG tablet TAKE 1 TABLET BY MOUTH DAILY   No facility-administered encounter medications on file as of 08/20/2022.     Lab Results  Component Value Date   WBC 8.4 06/01/2022   HGB 15.2 06/01/2022   HCT 46.7 06/01/2022   PLT 209.0 06/01/2022   GLUCOSE 95 08/20/2022   CHOL 213 (H) 08/20/2022   TRIG 306.0 (H) 08/20/2022   HDL 51.80 08/20/2022   LDLDIRECT 108.0 08/20/2022   LDLCALC 54 02/05/2021   ALT 25 08/20/2022   AST 26 08/20/2022   NA 139 08/20/2022   K 4.2 08/20/2022   CL 103 08/20/2022   CREATININE 1.34 08/20/2022   BUN 17 08/20/2022   CO2 25 08/20/2022   TSH 2.30 06/01/2022   PSA 0.00 (L) 06/16/2020   HGBA1C 5.5 08/20/2022    DG PAIN CLINIC C-ARM 1-60 MIN NO REPORT  Result Date: 05/13/2022 Fluoro was used, but no Radiologist interpretation will be provided. Please refer to "NOTES" tab for provider progress note.      Assessment & Plan:  Primary  hypertension Assessment & Plan: Taking hydralazine, avapro and amlodipine.  Taking hydralazine to '50mg'$  bid.  Follow pressures. Follow metabolic panel.   Orders: -     Comprehensive metabolic panel  Hyperglycemia Assessment & Plan: Low carb diet and exercise.  Follow met b and a1c.   Orders: -     Hemoglobin A1c  Hypothyroidism, unspecified type Assessment & Plan: On thyroid replacement.  Follow tsh.     Hypercholesterolemia Assessment & Plan: Low cholesterol diet and exercise. Crestor.  Follow lipid panel and liver function tests.   Orders: -     Lipid panel  Barrett's esophagus without dysplasia Assessment & Plan: No upper symptoms.  On prilosec.  Stable. Has been followed by GI.    Stage 3a chronic kidney disease ([place]) Assessment & Plan: Continues on avapro.  Avoid antiinflammatories.  Follow metabolic panel. Renal ultrasound - no acute abnormality.  Has appt planned with nephrology.  Check metabolic panel today.    History of colonic polyps Assessment & Plan: Colonoscopy 03/2020 - multiple polyps.  Recommended f/u in one year.  Needs f/u.    History of malignant melanoma Assessment & Plan: Followed by dermatology.     History of prostate cancer Assessment & Plan: S/p prostatectomy.  Followed by Dr Bernardo Heater.     Weakness of lower extremity, unspecified laterality Assessment & Plan: Feels has improved some.  Desires no further testing.  Follow.    Lower extremity edema Assessment & Plan: Discussed the need to decrease sodium/salt intake.  Follow. Improved today.    Osteoarthritis of lumbar spine Assessment & Plan: Is followed by pain clinic.  Last visit, I started him on gabapentin.  Feels has helped but wears off.  Will increase to '100mg'$  bid.  Follow    Severe obesity (BMI 35.0-39.9) with comorbidity ([place]) Assessment & Plan: Diet and exercise.  Continue blood pressure medication for hypertension.  Continue crestor - hypercholesterolemia.    Other orders -     valACYclovir HCl; Take 1 tablet (1,000 mg total) by mouth daily.  Dispense: 90 tablet; Refill: 1 -     hydrALAZINE HCl; Take 1 tablet (50 mg total) by mouth 2 (two) times daily.  Dispense: 180 tablet; Refill: 1 -     Gabapentin; Take 1 capsule (100 mg total) by mouth 2 (two) times daily.  Dispense: 180 capsule; Refill: 1 -     LDL cholesterol, direct     Einar Pheasant, MD

## 2022-08-23 ENCOUNTER — Encounter: Payer: Self-pay | Admitting: Internal Medicine

## 2022-08-23 NOTE — Assessment & Plan Note (Signed)
Colonoscopy 03/2020 - multiple polyps.  Recommended f/u in one year.  Needs f/u.

## 2022-08-23 NOTE — Assessment & Plan Note (Signed)
On thyroid replacement.  Follow tsh.  

## 2022-08-23 NOTE — Assessment & Plan Note (Signed)
Feels has improved some.  Desires no further testing.  Follow.

## 2022-08-23 NOTE — Assessment & Plan Note (Signed)
Is followed by pain clinic.  Last visit, I started him on gabapentin.  Feels has helped but wears off.  Will increase to '100mg'$  bid.  Follow

## 2022-08-23 NOTE — Assessment & Plan Note (Signed)
Low carb diet and exercise.  Follow met b and a1c.  

## 2022-08-23 NOTE — Assessment & Plan Note (Signed)
Taking hydralazine, avapro and amlodipine.  Taking hydralazine to '50mg'$  bid.  Follow pressures. Follow metabolic panel.

## 2022-08-23 NOTE — Assessment & Plan Note (Signed)
Low cholesterol diet and exercise.  Crestor.  Follow lipid panel and liver function tests.   

## 2022-08-23 NOTE — Assessment & Plan Note (Signed)
Diet and exercise.  Continue blood pressure medication for hypertension.  Continue crestor - hypercholesterolemia.

## 2022-08-23 NOTE — Assessment & Plan Note (Signed)
Continues on avapro.  Avoid antiinflammatories.  Follow metabolic panel. Renal ultrasound - no acute abnormality.  Has appt planned with nephrology.  Check metabolic panel today.

## 2022-08-23 NOTE — Assessment & Plan Note (Signed)
S/p prostatectomy.  Followed by Dr Bernardo Heater.

## 2022-08-23 NOTE — Assessment & Plan Note (Signed)
No upper symptoms.  On prilosec.  Stable. Has been followed by GI.

## 2022-08-23 NOTE — Assessment & Plan Note (Signed)
Discussed the need to decrease sodium/salt intake.  Follow. Improved today.

## 2022-08-23 NOTE — Assessment & Plan Note (Signed)
Followed by dermatology

## 2022-08-24 ENCOUNTER — Encounter: Payer: Self-pay | Admitting: Internal Medicine

## 2022-09-24 ENCOUNTER — Other Ambulatory Visit: Payer: Self-pay

## 2022-09-24 MED ORDER — IRBESARTAN 300 MG PO TABS: 300.0000 mg | ORAL_TABLET | Freq: Every day | ORAL | 1 refills | Status: DC

## 2022-09-24 MED ORDER — ALLOPURINOL 100 MG PO TABS: 100.0000 mg | ORAL_TABLET | Freq: Every day | ORAL | 5 refills | Status: DC

## 2022-09-27 DIAGNOSIS — R801 Persistent proteinuria, unspecified: Secondary | ICD-10-CM | POA: Diagnosis not present

## 2022-09-27 DIAGNOSIS — I129 Hypertensive chronic kidney disease with stage 1 through stage 4 chronic kidney disease, or unspecified chronic kidney disease: Secondary | ICD-10-CM | POA: Diagnosis not present

## 2022-09-27 DIAGNOSIS — R600 Localized edema: Secondary | ICD-10-CM | POA: Diagnosis not present

## 2022-09-27 DIAGNOSIS — N1831 Chronic kidney disease, stage 3a: Secondary | ICD-10-CM | POA: Diagnosis not present

## 2022-10-12 ENCOUNTER — Telehealth: Payer: Self-pay | Admitting: Internal Medicine

## 2022-10-12 NOTE — Telephone Encounter (Signed)
Prescription Request  10/12/2022  LOV: 08/20/2022  What is the name of the medication or equipment? rosuvastatin (CRESTOR) 5 MG tablet  Have you contacted your pharmacy to request a refill? Yes   Which pharmacy would you like this sent to?  [facility] [place] [address] [place] [address] Phone: [phone] Fax: [phone]    Patient notified that their request is being sent to the clinical staff for review and that they should receive a response within 2 business days.   Please advise at [facility] [phone]

## 2022-10-13 NOTE — Telephone Encounter (Signed)
Called patient to clarify if taking or not, he says he is not taking and does not need refill. Says this medication makes his bones hurt

## 2022-10-19 ENCOUNTER — Other Ambulatory Visit: Payer: Self-pay | Admitting: Internal Medicine

## 2022-11-23 ENCOUNTER — Encounter: Payer: Self-pay | Admitting: Internal Medicine

## 2022-11-23 ENCOUNTER — Ambulatory Visit (INDEPENDENT_AMBULATORY_CARE_PROVIDER_SITE_OTHER): Payer: Medicare Other | Admitting: Internal Medicine

## 2022-11-23 VITALS — BP 138/76 | HR 78 | Temp 98.0°F | Resp 16 | Ht 68.0 in | Wt 262.0 lb

## 2022-11-23 DIAGNOSIS — R739 Hyperglycemia, unspecified: Secondary | ICD-10-CM | POA: Diagnosis not present

## 2022-11-23 DIAGNOSIS — Z860100 Personal history of colon polyps, unspecified: Secondary | ICD-10-CM

## 2022-11-23 DIAGNOSIS — R600 Localized edema: Secondary | ICD-10-CM

## 2022-11-23 DIAGNOSIS — E7800 Pure hypercholesterolemia, unspecified: Secondary | ICD-10-CM

## 2022-11-23 DIAGNOSIS — N1831 Chronic kidney disease, stage 3a: Secondary | ICD-10-CM

## 2022-11-23 DIAGNOSIS — Z85820 Personal history of malignant melanoma of skin: Secondary | ICD-10-CM

## 2022-11-23 DIAGNOSIS — I10 Essential (primary) hypertension: Secondary | ICD-10-CM

## 2022-11-23 DIAGNOSIS — E039 Hypothyroidism, unspecified: Secondary | ICD-10-CM | POA: Diagnosis not present

## 2022-11-23 DIAGNOSIS — M2550 Pain in unspecified joint: Secondary | ICD-10-CM

## 2022-11-23 DIAGNOSIS — M109 Gout, unspecified: Secondary | ICD-10-CM | POA: Diagnosis not present

## 2022-11-23 DIAGNOSIS — K2270 Barrett's esophagus without dysplasia: Secondary | ICD-10-CM | POA: Diagnosis not present

## 2022-11-23 DIAGNOSIS — Z8546 Personal history of malignant neoplasm of prostate: Secondary | ICD-10-CM

## 2022-11-23 MED ORDER — LEVOTHYROXINE SODIUM 125 MCG PO TABS: 125.0000 ug | ORAL_TABLET | Freq: Every day | ORAL | 3 refills | Status: DC

## 2022-11-23 MED ORDER — GABAPENTIN 100 MG PO CAPS: 100.0000 mg | ORAL_CAPSULE | Freq: Two times a day (BID) | ORAL | 1 refills | Status: DC

## 2022-11-23 MED ORDER — ROSUVASTATIN CALCIUM 5 MG PO TABS: 5.0000 mg | ORAL_TABLET | Freq: Every day | ORAL | 3 refills | Status: DC

## 2022-11-23 MED ORDER — HYDRALAZINE HCL 50 MG PO TABS: 50.0000 mg | ORAL_TABLET | Freq: Two times a day (BID) | ORAL | 1 refills | Status: DC

## 2022-11-23 MED ORDER — IRBESARTAN 300 MG PO TABS: 300.0000 mg | ORAL_TABLET | Freq: Every day | ORAL | 1 refills | Status: DC

## 2022-11-23 NOTE — Progress Notes (Signed)
Subjective:    [name], male    DOB: 1934/12/10, 88 y.o.   MRN: [id number]  Patient here for  Chief Complaint  Patient presents with   Medical Management of Chronic Issues    HPI Here to follow up regarding his blood pressure and cholesterol. He is accompanied by his daughter.  History obtained from both of them.  Limited mobility due to pain - back.  Is able to get around his house with walker.  No chest pain reported.  Breathing overall stable.  Saw nephrology 09/27/22 - f/u CKD.  Prescribed flomax to help with urinary issues.  Taking torsemide prn.  Averages 3 days per week.  Discussed decreased salt/sodium intake.  Eating.  No vomiting.  No bowel issues reported.    Past Medical History:  Diagnosis Date   Acute postoperative pain 03/21/2018   Anemia    Arthritis    Barrett esophagus    Cancer (HCC)    prostate,skin   Chicken pox    Diverticulitis    Dysrhythmia    GERD (gastroesophageal reflux disease)    Hyperlipidemia    Hypertension    Hypothyroidism    Melanoma (HCC)    Malignant resection   Sleep apnea    Ulcer    Past Surgical History:  Procedure Laterality Date   CARDIAC CATHETERIZATION     Cataract Surgery Right 02/13/14   COLON SURGERY  2006-2008-2011   polyps removed   COLONOSCOPY W/ POLYPECTOMY     COLONOSCOPY WITH PROPOFOL N/A 02/15/2018   Procedure: COLONOSCOPY WITH PROPOFOL;  Surgeon: Scot Jun, MD;  Location: [facility] ENDOSCOPY;  Service: Endoscopy;  Laterality: N/A;   COLONOSCOPY WITH PROPOFOL N/A 03/24/2020   Procedure: COLONOSCOPY WITH PROPOFOL;  Surgeon: Regis Bill, MD;  Location: [facility] ENDOSCOPY;  Service: Endoscopy;  Laterality: N/A;   cystocopy  2003   CYSTOSCOPY/RETROGRADE/URETEROSCOPY/STONE EXTRACTION WITH BASKET Right 10/28/2020   Procedure: CYSTOSCOPY/RETROGRADE PYELOGRAM/STONE EXTRACTION;  Surgeon: Riki Altes, MD;  Location: [facility] ORS;  Service: Urology;  Laterality: Right;   ESOPHAGOGASTRODUODENOSCOPY (EGD)  WITH PROPOFOL N/A 08/04/2016   Procedure: ESOPHAGOGASTRODUODENOSCOPY (EGD) WITH PROPOFOL;  Surgeon: Scot Jun, MD;  Location: [facility] ENDOSCOPY;  Service: Endoscopy;  Laterality: N/A;   ESOPHAGOGASTRODUODENOSCOPY (EGD) WITH PROPOFOL N/A 03/24/2020   Procedure: ESOPHAGOGASTRODUODENOSCOPY (EGD) WITH PROPOFOL;  Surgeon: Regis Bill, MD;  Location: [facility] ENDOSCOPY;  Service: Endoscopy;  Laterality: N/A;   HEMORRHOID SURGERY     PROSTATE SURGERY     sleep study     Family History  Problem Relation Age of Onset   Liver disease Mother    Heart disease Father    Kidney disease Father    Breast cancer Daughter    Social History   Socioeconomic History   Marital status: Widowed    Spouse name: Not on file   Number of children: Not on file   Years of education: Not on file   Highest education level: 12th grade  Occupational History   Not on file  Tobacco Use   Smoking status: Never   Smokeless tobacco: Never  Vaping Use   Vaping Use: Never used  Substance and Sexual Activity   Alcohol use: No    Alcohol/week: 0.0 standard drinks of alcohol   Drug use: No   Sexual activity: Never  Other Topics Concern   Not on file  Social History Narrative   Not on file   Social Determinants of Health   Financial Resource Strain: Low Risk  (  11/20/2022)   Overall Financial Resource Strain (CARDIA)    Difficulty of Paying Living Expenses: Not very hard  Food Insecurity: No Food Insecurity (11/20/2022)   Hunger Vital Sign    Worried About Running Out of Food in the Last Year: Never true    Ran Out of Food in the Last Year: Never true  Transportation Needs: No Transportation Needs (11/20/2022)   PRAPARE - Administrator, Civil Service (Medical): No    Lack of Transportation (Non-Medical): No  Physical Activity: Unknown (11/20/2022)   Exercise Vital Sign    Days of Exercise per Week: 0 days    Minutes of Exercise per Session: Not on file  Stress: No Stress Concern Present  (11/20/2022)   [employer] of Occupational Health - Occupational Stress Questionnaire    Feeling of Stress : Not at all  Social Connections: Unknown (11/20/2022)   Social Connection and Isolation Panel [NHANES]    Frequency of Communication with Friends and Family: More than three times a week    Frequency of Social Gatherings with Friends and Family: More than three times a week    Attends Religious Services: Patient declined    Database administrator or Organizations: Patient declined    Attends Banker Meetings: Not on file    Marital Status: Widowed     Review of Systems  Constitutional:  Negative for appetite change and fever.  HENT:  Negative for congestion and sinus pressure.   Respiratory:  Negative for cough and chest tightness.        Breathing stable.   Cardiovascular:  Negative for chest pain and palpitations.  Gastrointestinal:  Negative for abdominal pain, diarrhea and vomiting.  Genitourinary:  Negative for difficulty urinating and dysuria.  Musculoskeletal:  Negative for joint swelling and myalgias.  Skin:  Negative for color change and rash.  Neurological:  Negative for dizziness and headaches.  Psychiatric/Behavioral:  Negative for agitation and dysphoric mood.        Objective:     BP 138/76   Pulse 78   Temp 98 F (36.7 C)   Resp 16   Ht 5\' 8"  (1.727 m)   Wt 262 lb (118.8 kg)   SpO2 98%   BMI 39.84 kg/m  Wt Readings from Last 3 Encounters:  11/23/22 262 lb (118.8 kg)  08/20/22 260 lb (117.9 kg)  08/04/22 250 lb (113.4 kg)    Physical Exam Vitals reviewed.  Constitutional:      General: He is not in acute distress.    Appearance: Normal appearance. He is well-developed.  HENT:     Head: Normocephalic and atraumatic.     Right Ear: External ear normal.     Left Ear: External ear normal.  Eyes:     General: No scleral icterus.       Right eye: No discharge.        Left eye: No discharge.     Conjunctiva/sclera:  Conjunctivae normal.  Cardiovascular:     Rate and Rhythm: Normal rate and regular rhythm.  Pulmonary:     Effort: Pulmonary effort is normal. No respiratory distress.     Breath sounds: Normal breath sounds.  Abdominal:     General: Bowel sounds are normal.     Palpations: Abdomen is soft.     Tenderness: There is no abdominal tenderness.  Musculoskeletal:        General: No tenderness.     Cervical back: Neck supple. No tenderness.  Comments: Pedal and ankle edema - stable.   Lymphadenopathy:     Cervical: No cervical adenopathy.  Skin:    Findings: No erythema or rash.  Neurological:     Mental Status: He is alert.  Psychiatric:        Mood and Affect: Mood normal.        Behavior: Behavior normal.      Outpatient Encounter Medications as of 11/23/2022  Medication Sig   acetaminophen (TYLENOL) 500 MG tablet Take 500 mg by mouth every 6 (six) hours as needed.   allopurinol (ZYLOPRIM) 100 MG tablet Take 1 tablet (100 mg total) by mouth daily.   amLODipine (NORVASC) 10 MG tablet TAKE 1 TABLET BY MOUTH DAILY   aspirin 81 MG tablet Take 81 mg by mouth daily.   fluticasone (FLONASE) 50 MCG/ACT nasal spray TWO PUFFS IN EACH NOSTRIL ONCE A DAY   gabapentin (NEURONTIN) 100 MG capsule Take 1 capsule (100 mg total) by mouth 2 (two) times daily.   hydrALAZINE (APRESOLINE) 50 MG tablet Take 1 tablet (50 mg total) by mouth 2 (two) times daily.   irbesartan (AVAPRO) 300 MG tablet Take 1 tablet (300 mg total) by mouth daily.   levothyroxine (SYNTHROID) 125 MCG tablet Take 1 tablet (125 mcg total) by mouth daily.   LYSINE PO Take 1 tablet by mouth daily.   omeprazole (PRILOSEC) 20 MG capsule TAKE ONE CAPSULE BY MOUTH TWICE A DAY   rosuvastatin (CRESTOR) 5 MG tablet Take 1 tablet (5 mg total) by mouth daily.   senna (SENOKOT) 8.6 MG TABS tablet Take 2 tablets by mouth daily.   torsemide (DEMADEX) 10 MG tablet Take 10 mg by mouth daily as needed.   traMADol (ULTRAM) 50 MG tablet Take 1  tablet (50 mg total) by mouth in the morning, at noon, and at bedtime. Each refill must last 30 days.   valACYclovir (VALTREX) 1000 MG tablet Take 1 tablet (1,000 mg total) by mouth daily.   VITAMIN D PO Take 1 tablet by mouth daily.   [DISCONTINUED] gabapentin (NEURONTIN) 100 MG capsule Take 1 capsule (100 mg total) by mouth 2 (two) times daily.   [DISCONTINUED] hydrALAZINE (APRESOLINE) 50 MG tablet Take 1 tablet (50 mg total) by mouth 2 (two) times daily.   [DISCONTINUED] irbesartan (AVAPRO) 300 MG tablet Take 1 tablet (300 mg total) by mouth daily.   [DISCONTINUED] levothyroxine (SYNTHROID) 125 MCG tablet Take 1 tablet (125 mcg total) by mouth daily.   [DISCONTINUED] rosuvastatin (CRESTOR) 5 MG tablet TAKE 1 TABLET BY MOUTH DAILY   No facility-administered encounter medications on file as of 11/23/2022.     Lab Results  Component Value Date   WBC 8.4 06/01/2022   HGB 15.2 06/01/2022   HCT 46.7 06/01/2022   PLT 209.0 06/01/2022   GLUCOSE 95 08/20/2022   CHOL 213 (H) 08/20/2022   TRIG 306.0 (H) 08/20/2022   HDL 51.80 08/20/2022   LDLDIRECT 108.0 08/20/2022   LDLCALC 54 02/05/2021   ALT 25 08/20/2022   AST 26 08/20/2022   NA 139 08/20/2022   K 4.2 08/20/2022   CL 103 08/20/2022   CREATININE 1.34 08/20/2022   BUN 17 08/20/2022   CO2 25 08/20/2022   TSH 2.30 06/01/2022   PSA 0.00 (L) 06/16/2020   HGBA1C 5.5 08/20/2022    DG PAIN CLINIC C-ARM 1-60 MIN NO REPORT  Result Date: 05/13/2022 Fluoro was used, but no Radiologist interpretation will be provided. Please refer to "NOTES" tab for provider progress note.  Assessment & Plan:  Hyperglycemia Assessment & Plan: Low carb diet and exercise.  Follow met b and a1c.   Orders: -     Hemoglobin A1c; Future  Hypercholesterolemia Assessment & Plan: Low cholesterol diet and exercise. Crestor.  Follow lipid panel and liver function tests.   Orders: -     Lipid panel; Future -     Hepatic function panel; Future -      Basic metabolic panel; Future  Arthralgia, unspecified joint -     Ambulatory referral to Rheumatology  Barrett's esophagus without dysplasia Assessment & Plan: No upper symptoms.  On prilosec.  Stable. Has been followed by GI.    Stage 3a chronic kidney disease (HCC) Assessment & Plan: Continues on avapro.  Avoid antiinflammatories.  Follow metabolic panel. Renal ultrasound - no acute abnormality.  Saw nephrology 09/27/22.     Gout, unspecified cause, unspecified chronicity, unspecified site Assessment & Plan: Continue allopurinol.  Stable.    History of colonic polyps Assessment & Plan: Colonoscopy 03/2020 - multiple polyps.  Recommended f/u in one year.  Needs f/u.    History of malignant melanoma Assessment & Plan: Followed by dermatology.     History of prostate cancer Assessment & Plan: S/p prostatectomy.  Followed by Dr Lonna Cobb.     Primary hypertension Assessment & Plan: Taking hydralazine, avapro and amlodipine.  Taking hydralazine to 50mg  bid.  Follow pressures. Follow metabolic panel.    Hypothyroidism, unspecified type Assessment & Plan: On thyroid replacement.  Follow tsh.    Lower extremity edema Assessment & Plan: Discussed the need to decrease sodium/salt intake.  Follow. Elevate legs when sitting.  Discussed volume overload and monitoring weight.  Discussed weighing daily.  Instructed if gains more than 3 pounds in one day or 5 pounds in one week to take torsemide.    Other orders -     Gabapentin; Take 1 capsule (100 mg total) by mouth 2 (two) times daily.  Dispense: 180 capsule; Refill: 1 -     hydrALAZINE HCl; Take 1 tablet (50 mg total) by mouth 2 (two) times daily.  Dispense: 180 tablet; Refill: 1 -     Irbesartan; Take 1 tablet (300 mg total) by mouth daily.  Dispense: 90 tablet; Refill: 1 -     Levothyroxine Sodium; Take 1 tablet (125 mcg total) by mouth daily.  Dispense: 90 tablet; Refill: 3 -     Rosuvastatin Calcium; Take 1 tablet (5 mg  total) by mouth daily.  Dispense: 90 tablet; Refill: 3     Dale , MD

## 2022-11-23 NOTE — Patient Instructions (Signed)
Weight daily.  If you gain more than 3lbs in one day or 5 lbs in one week - take torsemide (fluid pill).

## 2022-12-01 DIAGNOSIS — H02882 Meibomian gland dysfunction right lower eyelid: Secondary | ICD-10-CM | POA: Diagnosis not present

## 2022-12-01 DIAGNOSIS — I10 Essential (primary) hypertension: Secondary | ICD-10-CM | POA: Diagnosis not present

## 2022-12-01 DIAGNOSIS — H02884 Meibomian gland dysfunction left upper eyelid: Secondary | ICD-10-CM | POA: Diagnosis not present

## 2022-12-01 DIAGNOSIS — Z961 Presence of intraocular lens: Secondary | ICD-10-CM | POA: Diagnosis not present

## 2022-12-01 DIAGNOSIS — H2512 Age-related nuclear cataract, left eye: Secondary | ICD-10-CM | POA: Diagnosis not present

## 2022-12-05 ENCOUNTER — Encounter: Payer: Self-pay | Admitting: Internal Medicine

## 2022-12-06 ENCOUNTER — Ambulatory Visit (INDEPENDENT_AMBULATORY_CARE_PROVIDER_SITE_OTHER): Payer: Medicare Other | Admitting: Internal Medicine

## 2022-12-06 ENCOUNTER — Encounter: Payer: Self-pay | Admitting: Internal Medicine

## 2022-12-06 ENCOUNTER — Telehealth: Payer: Medicare Other | Admitting: Physician Assistant

## 2022-12-06 VITALS — BP 126/74 | HR 68 | Temp 98.0°F | Resp 16 | Ht 68.0 in | Wt 261.0 lb

## 2022-12-06 DIAGNOSIS — M109 Gout, unspecified: Secondary | ICD-10-CM

## 2022-12-06 DIAGNOSIS — R600 Localized edema: Secondary | ICD-10-CM | POA: Diagnosis not present

## 2022-12-06 DIAGNOSIS — M7989 Other specified soft tissue disorders: Secondary | ICD-10-CM

## 2022-12-06 DIAGNOSIS — I10 Essential (primary) hypertension: Secondary | ICD-10-CM | POA: Diagnosis not present

## 2022-12-06 MED ORDER — METHYLPREDNISOLONE 4 MG PO TBPK
ORAL_TABLET | ORAL | 0 refills | Status: AC
Start: 2022-12-06 — End: ?

## 2022-12-06 NOTE — Assessment & Plan Note (Signed)
Continues on avapro.  Avoid antiinflammatories.  Follow metabolic panel. Renal ultrasound - no acute abnormality.  Saw nephrology 09/27/22.

## 2022-12-06 NOTE — Assessment & Plan Note (Signed)
Colonoscopy 03/2020 - multiple polyps.  Recommended f/u in one year.  Needs f/u.  

## 2022-12-06 NOTE — Telephone Encounter (Signed)
LMTCB

## 2022-12-06 NOTE — Telephone Encounter (Signed)
Patient returned office phone call. 

## 2022-12-06 NOTE — Progress Notes (Signed)
Subjective:    [name], male    DOB: 02-13-1935, 88 y.o.   MRN: [id number]  Patient here for  Chief Complaint  Patient presents with   Gout    HPI Here as a work in - work in appt for possible gout.  Reports that a few days ago, he noticed his foot felt warm.  No injury.  Increased pain.  Has had issues with swelling.  No change in diet.  No fever.  No redness extending up the leg.     Past Medical History:  Diagnosis Date   Acute postoperative pain 03/21/2018   Anemia    Arthritis    Barrett esophagus    Cancer (HCC)    prostate,skin   Chicken pox    Diverticulitis    Dysrhythmia    GERD (gastroesophageal reflux disease)    Hyperlipidemia    Hypertension    Hypothyroidism    Melanoma (HCC)    Malignant resection   Sleep apnea    Ulcer    Past Surgical History:  Procedure Laterality Date   CARDIAC CATHETERIZATION     Cataract Surgery Right 02/13/14   COLON SURGERY  2006-2008-2011   polyps removed   COLONOSCOPY W/ POLYPECTOMY     COLONOSCOPY WITH PROPOFOL N/A 02/15/2018   Procedure: COLONOSCOPY WITH PROPOFOL;  Surgeon: Scot Jun, MD;  Location: [facility] ENDOSCOPY;  Service: Endoscopy;  Laterality: N/A;   COLONOSCOPY WITH PROPOFOL N/A 03/24/2020   Procedure: COLONOSCOPY WITH PROPOFOL;  Surgeon: Regis Bill, MD;  Location: [facility] ENDOSCOPY;  Service: Endoscopy;  Laterality: N/A;   cystocopy  2003   CYSTOSCOPY/RETROGRADE/URETEROSCOPY/STONE EXTRACTION WITH BASKET Right 10/28/2020   Procedure: CYSTOSCOPY/RETROGRADE PYELOGRAM/STONE EXTRACTION;  Surgeon: Riki Altes, MD;  Location: [facility] ORS;  Service: Urology;  Laterality: Right;   ESOPHAGOGASTRODUODENOSCOPY (EGD) WITH PROPOFOL N/A 08/04/2016   Procedure: ESOPHAGOGASTRODUODENOSCOPY (EGD) WITH PROPOFOL;  Surgeon: Scot Jun, MD;  Location: [facility] ENDOSCOPY;  Service: Endoscopy;  Laterality: N/A;   ESOPHAGOGASTRODUODENOSCOPY (EGD) WITH PROPOFOL N/A 03/24/2020   Procedure:  ESOPHAGOGASTRODUODENOSCOPY (EGD) WITH PROPOFOL;  Surgeon: Regis Bill, MD;  Location: [facility] ENDOSCOPY;  Service: Endoscopy;  Laterality: N/A;   HEMORRHOID SURGERY     PROSTATE SURGERY     sleep study     Family History  Problem Relation Age of Onset   Liver disease Mother    Heart disease Father    Kidney disease Father    Breast cancer Daughter    Social History   Socioeconomic History   Marital status: Widowed    Spouse name: Not on file   Number of children: Not on file   Years of education: Not on file   Highest education level: 12th grade  Occupational History   Not on file  Tobacco Use   Smoking status: Never   Smokeless tobacco: Never  Vaping Use   Vaping Use: Never used  Substance and Sexual Activity   Alcohol use: No    Alcohol/week: 0.0 standard drinks of alcohol   Drug use: No   Sexual activity: Never  Other Topics Concern   Not on file  Social History Narrative   Not on file   Social Determinants of Health   Financial Resource Strain: Low Risk  (11/20/2022)   Overall Financial Resource Strain (CARDIA)    Difficulty of Paying Living Expenses: Not very hard  Food Insecurity: No Food Insecurity (11/20/2022)   Hunger Vital Sign    Worried About Running Out of Food  in the Last Year: Never true    Ran Out of Food in the Last Year: Never true  Transportation Needs: No Transportation Needs (11/20/2022)   PRAPARE - Administrator, Civil Service (Medical): No    Lack of Transportation (Non-Medical): No  Physical Activity: Unknown (11/20/2022)   Exercise Vital Sign    Days of Exercise per Week: 0 days    Minutes of Exercise per Session: Not on file  Stress: No Stress Concern Present (11/20/2022)   [employer] of Occupational Health - Occupational Stress Questionnaire    Feeling of Stress : Not at all  Social Connections: Unknown (11/20/2022)   Social Connection and Isolation Panel [NHANES]    Frequency of Communication with Friends and  Family: More than three times a week    Frequency of Social Gatherings with Friends and Family: More than three times a week    Attends Religious Services: Patient declined    Database administrator or Organizations: Patient declined    Attends Banker Meetings: Not on file    Marital Status: Widowed     Review of Systems  Constitutional:  Negative for appetite change and fever.  HENT:  Negative for congestion and sinus pressure.   Respiratory:  Negative for cough, chest tightness and shortness of breath.   Cardiovascular:  Negative for chest pain and palpitations.  Gastrointestinal:  Negative for abdominal pain, diarrhea, nausea and vomiting.  Genitourinary:  Negative for difficulty urinating and dysuria.  Musculoskeletal:  Negative for myalgias.       Increased swelling - feet - stable.  Increased tenderness - base of great toe - right foot    Skin:  Negative for color change and rash.  Neurological:  Negative for dizziness and headaches.  Psychiatric/Behavioral:  Negative for agitation and dysphoric mood.        Objective:     BP 126/74   Pulse 68   Temp 98 F (36.7 C)   Resp 16   Ht 5\' 8"  (1.727 m)   Wt 261 lb (118.4 kg)   SpO2 97%   BMI 39.68 kg/m  Wt Readings from Last 3 Encounters:  12/06/22 261 lb (118.4 kg)  11/23/22 262 lb (118.8 kg)  08/20/22 260 lb (117.9 kg)    Physical Exam Vitals reviewed.  Constitutional:      General: He is not in acute distress.    Appearance: Normal appearance. He is well-developed.  HENT:     Head: Normocephalic and atraumatic.     Right Ear: External ear normal.     Left Ear: External ear normal.  Eyes:     General: No scleral icterus.       Right eye: No discharge.        Left eye: No discharge.     Conjunctiva/sclera: Conjunctivae normal.  Cardiovascular:     Rate and Rhythm: Normal rate and regular rhythm.  Pulmonary:     Effort: Pulmonary effort is normal. No respiratory distress.     Breath sounds:  Normal breath sounds.  Abdominal:     General: Bowel sounds are normal.     Palpations: Abdomen is soft.     Tenderness: There is no abdominal tenderness.  Musculoskeletal:     Cervical back: Neck supple. No tenderness.     Comments: Pedal edema - stable.  Pain - with palpation - base of right great toe. No erythema extending up the leg.   Lymphadenopathy:     Cervical:  No cervical adenopathy.  Skin:    Findings: No erythema or rash.  Neurological:     Mental Status: He is alert.  Psychiatric:        Mood and Affect: Mood normal.        Behavior: Behavior normal.      Outpatient Encounter Medications as of 12/06/2022  Medication Sig   methylPREDNISolone (MEDROL DOSEPAK) 4 MG TBPK tablet Medrol dosepak 6 day taper.  Take as directed.   acetaminophen (TYLENOL) 500 MG tablet Take 500 mg by mouth every 6 (six) hours as needed.   allopurinol (ZYLOPRIM) 100 MG tablet Take 1 tablet (100 mg total) by mouth daily.   amLODipine (NORVASC) 10 MG tablet TAKE 1 TABLET BY MOUTH DAILY   aspirin 81 MG tablet Take 81 mg by mouth daily.   fluticasone (FLONASE) 50 MCG/ACT nasal spray TWO PUFFS IN EACH NOSTRIL ONCE A DAY   gabapentin (NEURONTIN) 100 MG capsule Take 1 capsule (100 mg total) by mouth 2 (two) times daily.   hydrALAZINE (APRESOLINE) 50 MG tablet Take 1 tablet (50 mg total) by mouth 2 (two) times daily.   irbesartan (AVAPRO) 300 MG tablet Take 1 tablet (300 mg total) by mouth daily.   levothyroxine (SYNTHROID) 125 MCG tablet Take 1 tablet (125 mcg total) by mouth daily.   LYSINE PO Take 1 tablet by mouth daily.   omeprazole (PRILOSEC) 20 MG capsule TAKE ONE CAPSULE BY MOUTH TWICE A DAY   rosuvastatin (CRESTOR) 5 MG tablet Take 1 tablet (5 mg total) by mouth daily.   senna (SENOKOT) 8.6 MG TABS tablet Take 2 tablets by mouth daily.   torsemide (DEMADEX) 10 MG tablet Take 10 mg by mouth daily as needed.   traMADol (ULTRAM) 50 MG tablet Take 1 tablet (50 mg total) by mouth in the morning, at  noon, and at bedtime. Each refill must last 30 days.   valACYclovir (VALTREX) 1000 MG tablet Take 1 tablet (1,000 mg total) by mouth daily.   VITAMIN D PO Take 1 tablet by mouth daily.   No facility-administered encounter medications on file as of 12/06/2022.     Lab Results  Component Value Date   WBC 8.4 06/01/2022   HGB 15.2 06/01/2022   HCT 46.7 06/01/2022   PLT 209.0 06/01/2022   GLUCOSE 95 08/20/2022   CHOL 213 (H) 08/20/2022   TRIG 306.0 (H) 08/20/2022   HDL 51.80 08/20/2022   LDLDIRECT 108.0 08/20/2022   LDLCALC 54 02/05/2021   ALT 25 08/20/2022   AST 26 08/20/2022   NA 139 08/20/2022   K 4.2 08/20/2022   CL 103 08/20/2022   CREATININE 1.34 08/20/2022   BUN 17 08/20/2022   CO2 25 08/20/2022   TSH 2.30 06/01/2022   PSA 0.00 (L) 06/16/2020   HGBA1C 5.5 08/20/2022    DG PAIN CLINIC C-ARM 1-60 MIN NO REPORT  Result Date: 05/13/2022 Fluoro was used, but no Radiologist interpretation will be provided. Please refer to "NOTES" tab for provider progress note.      Assessment & Plan:  Gout, unspecified cause, unspecified chronicity, unspecified site Assessment & Plan: Has a history  of gout.  Has bene on allopurinol.  Symptoms and exam appear to be c/w gout flare.  Given CKD, would avoid antiinflammatory medication.  Treat with medrol dosepak 6 day taper. Follow.  Call with update.     Primary hypertension Assessment & Plan: Taking hydralazine, avapro and amlodipine.  Taking hydralazine to 50mg  bid.  Follow pressures. Follow metabolic  panel.    Lower extremity edema Assessment & Plan: Have discussed the need to decrease sodium/salt intake.  Follow. Elevate legs when sitting.  Discussed volume overload and monitoring weight.  He is weighing himself now daily.  Instructed if gains more than 3 pounds in one day or 5 pounds in one week to take torsemide.    Other orders -     methylPREDNISolone; Medrol dosepak 6 day taper.  Take as directed.  Dispense: 21 tablet;  Refill: 0     Dale Post, MD

## 2022-12-06 NOTE — Assessment & Plan Note (Signed)
Low carb diet and exercise.  Follow met b and a1c.   

## 2022-12-06 NOTE — Assessment & Plan Note (Signed)
Taking hydralazine, avapro and amlodipine.  Taking hydralazine to 50mg bid.  Follow pressures. Follow metabolic panel.  

## 2022-12-06 NOTE — Assessment & Plan Note (Signed)
S/p prostatectomy.  Followed by Dr Stoioff.   

## 2022-12-06 NOTE — Assessment & Plan Note (Addendum)
Discussed the need to decrease sodium/salt intake.  Follow. Elevate legs when sitting.  Discussed volume overload and monitoring weight.  Discussed weighing daily.  Instructed if gains more than 3 pounds in one day or 5 pounds in one week to take torsemide.

## 2022-12-06 NOTE — Assessment & Plan Note (Signed)
Followed by dermatology

## 2022-12-06 NOTE — Telephone Encounter (Signed)
Pt scheduled to see Dr. Scott this PM 

## 2022-12-06 NOTE — Assessment & Plan Note (Signed)
On thyroid replacement.  Follow tsh.  

## 2022-12-06 NOTE — Assessment & Plan Note (Signed)
Low cholesterol diet and exercise.  Crestor.  Follow lipid panel and liver function tests.   

## 2022-12-06 NOTE — Progress Notes (Signed)
Because of severity of swelling and need to differentiate true gout flare from cellulitis/infection of the skin, I feel your condition warrants further evaluation and I recommend that you be seen in a face to face visit.   NOTE: There will be NO CHARGE for this eVisit   If you are having a true medical emergency please call 911.      For an urgent face to face visit, [place] has eight urgent care centers for your convenience:   NEW!! [facility] Health Urgent Care Center at [facility] Get Driving Directions [phone] [address], [address] [place], [address]    [facility] Health Urgent Care Center at [facility] Get Driving Directions [phone] [address] [place], [place] [address]   [facility] Urgent Care Center [facility] [place] [facility]) Get Driving Directions [phone] [address] [place], [place] [address]  [facility] Health Urgent Care Center [facility]) Get Driving Directions [phone] [address] [place],  [place]  [address]  [facility] Health Urgent Care Center [facility] - at [employer]  [phone] [address] [place],  [place] [address]   [facility] Health Urgent Care at [facility] Get Driving Directions [phone] [address], [address] [place], [place] [address]   [facility] Health Urgent Care at [facility] Get Driving Directions  [phone] [address] [place], [place] [address]   [facility] Health Urgent Care at [facility] Directions [phone] [address], [address] [place], [place] [address]  Your MyChart E-visit questionnaire answers were reviewed by a board certified advanced clinical practitioner to complete your personal care plan based on your specific symptoms.  Thank you for using e-Visits.

## 2022-12-06 NOTE — Assessment & Plan Note (Signed)
Continue allopurinol.  Stable.  °

## 2022-12-06 NOTE — Assessment & Plan Note (Signed)
No upper symptoms.  On prilosec.  Stable. Has been followed by GI.  

## 2022-12-12 ENCOUNTER — Encounter: Payer: Self-pay | Admitting: Internal Medicine

## 2022-12-12 NOTE — Assessment & Plan Note (Signed)
Taking hydralazine, avapro and amlodipine.  Taking hydralazine to 50mg bid.  Follow pressures. Follow metabolic panel.  

## 2022-12-12 NOTE — Assessment & Plan Note (Signed)
Have discussed the need to decrease sodium/salt intake.  Follow. Elevate legs when sitting.  Discussed volume overload and monitoring weight.  He is weighing himself now daily.  Instructed if gains more than 3 pounds in one day or 5 pounds in one week to take torsemide.

## 2022-12-12 NOTE — Assessment & Plan Note (Signed)
Has a history  of gout.  Has bene on allopurinol.  Symptoms and exam appear to be c/w gout flare.  Given CKD, would avoid antiinflammatory medication.  Treat with medrol dosepak 6 day taper. Follow.  Call with update.

## 2022-12-14 ENCOUNTER — Other Ambulatory Visit: Payer: Self-pay | Admitting: Pharmacist

## 2022-12-14 NOTE — Progress Notes (Signed)
Pharmacy Quality Measure Review  This patient is appearing on the insurance-provided list for being at risk of failing the adherence measure for cholesterol medications this calendar year.   Medication: rosuvastatin  Last fill date: 09/02/22   Contacted pharmacy to confirm the above. They note that they were told by the patient that he stopped this medication due to muscle pains.   Attempted to contact patient to discuss. Left voicemail for him to return our call at his convenience.   Catie Eppie Gibson, PharmD, BCACP, CPP [facility] [phone]

## 2022-12-15 NOTE — Telephone Encounter (Signed)
If he has not been able to tell a bid difference off the medication, then I am good to try qod and let us know if a problem.  Thank you .

## 2022-12-29 DIAGNOSIS — I129 Hypertensive chronic kidney disease with stage 1 through stage 4 chronic kidney disease, or unspecified chronic kidney disease: Secondary | ICD-10-CM | POA: Diagnosis not present

## 2022-12-29 DIAGNOSIS — N1831 Chronic kidney disease, stage 3a: Secondary | ICD-10-CM | POA: Diagnosis not present

## 2022-12-29 DIAGNOSIS — R600 Localized edema: Secondary | ICD-10-CM | POA: Diagnosis not present

## 2022-12-29 DIAGNOSIS — R801 Persistent proteinuria, unspecified: Secondary | ICD-10-CM | POA: Diagnosis not present

## 2023-01-05 DIAGNOSIS — I129 Hypertensive chronic kidney disease with stage 1 through stage 4 chronic kidney disease, or unspecified chronic kidney disease: Secondary | ICD-10-CM | POA: Diagnosis not present

## 2023-01-05 DIAGNOSIS — N1831 Chronic kidney disease, stage 3a: Secondary | ICD-10-CM | POA: Diagnosis not present

## 2023-01-05 DIAGNOSIS — R600 Localized edema: Secondary | ICD-10-CM | POA: Diagnosis not present

## 2023-01-05 DIAGNOSIS — R801 Persistent proteinuria, unspecified: Secondary | ICD-10-CM | POA: Diagnosis not present

## 2023-01-10 DIAGNOSIS — Z79899 Other long term (current) drug therapy: Secondary | ICD-10-CM | POA: Diagnosis not present

## 2023-01-10 DIAGNOSIS — M1A09X0 Idiopathic chronic gout, multiple sites, without tophus (tophi): Secondary | ICD-10-CM | POA: Diagnosis not present

## 2023-01-10 DIAGNOSIS — M7989 Other specified soft tissue disorders: Secondary | ICD-10-CM | POA: Diagnosis not present

## 2023-01-10 DIAGNOSIS — M064 Inflammatory polyarthropathy: Secondary | ICD-10-CM | POA: Diagnosis not present

## 2023-01-24 ENCOUNTER — Other Ambulatory Visit (INDEPENDENT_AMBULATORY_CARE_PROVIDER_SITE_OTHER): Payer: Medicare Other

## 2023-01-24 DIAGNOSIS — R739 Hyperglycemia, unspecified: Secondary | ICD-10-CM

## 2023-01-24 DIAGNOSIS — E7800 Pure hypercholesterolemia, unspecified: Secondary | ICD-10-CM

## 2023-01-24 DIAGNOSIS — E039 Hypothyroidism, unspecified: Secondary | ICD-10-CM | POA: Diagnosis not present

## 2023-01-24 LAB — LIPID PANEL
Cholesterol: 189 mg/dL (ref 0–200)
HDL: 72.5 mg/dL (ref >39.00)
NonHDL: 116.42
Total CHOL/HDL Ratio: 3
Triglycerides: 203 mg/dL — ABNORMAL HIGH (ref 0.0–149.0)
VLDL: 40.6 mg/dL — ABNORMAL HIGH (ref 0.0–40.0)

## 2023-01-24 LAB — HEPATIC FUNCTION PANEL
ALT: 47 U/L (ref 0–53)
AST: 32 U/L (ref 0–37)
Albumin: 4.1 g/dL (ref 3.5–5.2)
Alkaline Phosphatase: 58 U/L (ref 39–117)
Bilirubin, Direct: 0.1 mg/dL (ref 0.0–0.3)
Total Bilirubin: 0.6 mg/dL (ref 0.2–1.2)
Total Protein: 7.1 g/dL (ref 6.0–8.3)

## 2023-01-24 LAB — BASIC METABOLIC PANEL
BUN: 19 mg/dL (ref 6–23)
CO2: 31 mEq/L (ref 19–32)
Calcium: 9.8 mg/dL (ref 8.4–10.5)
Chloride: 102 mEq/L (ref 96–112)
Creatinine, Ser: 1.44 mg/dL (ref 0.40–1.50)
GFR: 44.27 mL/min — ABNORMAL LOW (ref >60.00)
Glucose, Bld: 89 mg/dL (ref 70–99)
Potassium: 3.8 mEq/L (ref 3.5–5.1)
Sodium: 141 mEq/L (ref 135–145)

## 2023-01-24 LAB — HEMOGLOBIN A1C: Hgb A1c MFr Bld: 5.7 % (ref 4.6–6.5)

## 2023-01-24 LAB — LDL CHOLESTEROL, DIRECT: Direct LDL: 89 mg/dL

## 2023-01-24 LAB — TSH: TSH: 2.89 u[IU]/mL (ref 0.35–5.50)

## 2023-01-24 NOTE — Progress Notes (Unsigned)
PROVIDER NOTE: Information contained herein reflects review and annotations entered in association with encounter. Interpretation of such information and data should be left to medically-trained personnel. Information provided to patient can be located elsewhere in the medical record under "Patient Instructions". Document created using STT-dictation technology, any transcriptional errors that may result from process are unintentional.    Patient: Hector [name]  Service Category: E/M  Provider: Oswaldo Done, MD  DOB: Oct 15, 1934  DOS: 01/26/2023  Referring Provider: Dale Alma, MD  MRN: [id number]  Specialty: Interventional Pain Management  PCP: Dale Brocton, MD  Type: Established Patient  Setting: Ambulatory outpatient    Location: Office  Delivery: Face-to-face     HPI  Mr. RICHARDS PHERIGO, a 88 y.o. year old male, is here today because of his No primary diagnosis found.. Mr. Ralston's primary complain today is No chief complaint on file.  Pertinent problems: Mr. Coutant has History of prostate cancer; Shoulder pain, left; Chronic hip pain (Left); Chronic low back pain (1ry area of Pain) (Bilateral) (L>R) w/ sciatica (Left); Chronic pain syndrome; Polyarthritis, unspecified (Secondary Area of Pain); L5-S1 bilateral pars defect with spondylolisthesis; Lumbosacral Grade 1 Anterolisthesis of L5 over S1; Lumbar facet arthropathy (Bilateral); Lumbar foraminal stenosis (L5-S1) (Bilateral) (L>R); Chronic Lumbar radiculitis (L5) (Left); Chronic musculoskeletal pain; Lumbar facet syndrome (Bilateral) (L>R); Chronic lower extremity pain (2ry area of Pain) (Left); Chronic lower extremity radicular pain (L5) (Left); Lumbar facet osteoarthritis (Bilateral); Osteoarthritis of lumbar spine; Lumbar spondylosis; Spondylosis without myelopathy or radiculopathy, lumbosacral region; Chronic right shoulder pain; DDD (degenerative disc disease), lumbosacral; Gout; Chronic low back pain (Bilateral) w/o sciatica; Neurogenic  pain; Abnormal MRI, lumbar spine (08/22/2021); Numbness and tingling of lower extremity (Right); Lumbosacral radiculopathy at S1 (Right); Chronic lower extremity pain (Right); Chronic low back pain (Bilateral) w/ sciatica (Right); and Right sided weakness on their pertinent problem list. Pain Assessment: Severity of   is reported as a  /10. Location:    / . Onset:  . Quality:  . Timing:  . Modifying factor(s):  [place] Kitchen Vitals:  vitals were not taken for this visit.  BMI: Estimated body mass index is 39.68 kg/m as calculated from the following:   Height as of 12/06/22: 5\' 8"  (1.727 m).   Weight as of 12/06/22: 261 lb (118.4 kg). Last encounter: 08/04/2022. Last procedure: 05/13/2022.  Reason for encounter: medication management. ***  Routine UDS ordered today.   RTCB:   Pharmacotherapy Assessment  Analgesic: Tramadol 50 mg, 1 tab PO q 8 hrs (150 mg/day of tramadol) MME/day: 15 mg/day.   Monitoring: [place] PMP: PDMP reviewed during this encounter.       Pharmacotherapy: No side-effects or adverse reactions reported. Compliance: No problems identified. Effectiveness: Clinically acceptable.  No notes on file  No results found for: "CBDTHCR" No results found for: "D8THCCBX" No results found for: "D9THCCBX"  UDS:  Summary  Date Value Ref Range Status  01/27/2022 Note  Final    Comment:    ==================================================================== [employer] [facility] 13 (MW) ==================================================================== Test                             Result       Flag       Units  Drug Present and Declared for Prescription Verification   Tramadol                       [phone]  EXPECTED   ng/mg creat   O-Desmethyltramadol            745          EXPECTED   ng/mg creat   N-Desmethyltramadol            >1767        EXPECTED   ng/mg creat    Source of tramadol is a prescription medication. O-desmethyltramadol    and N-desmethyltramadol are expected metabolites of  tramadol.  ==================================================================== Test                      Result    Flag   Units      Ref Range   Creatinine              283              mg/dL      >=62 ==================================================================== Declared Medications:  The flagging and interpretation on this report are based on the  following declared medications.  Unexpected results may arise from  inaccuracies in the declared medications.   **Note: The testing scope of this panel includes these medications:   Tramadol (Ultram)   **Note: The testing scope of this panel does not include the  following reported medications:   Acetaminophen (Tylenol)  Allopurinol (Zyloprim)  Amlodipine (Norvasc)  Aspirin  Calcium  Fluticasone (Flonase)  Hydralazine (Apresoline)  Irbesartan (Avapro)  Levothyroxine (Synthroid)  Lysine  Omeprazole (Prilosec)  Rosuvastatin (Crestor)  Sennosides (Senokot)  Valacyclovir (Valtrex)  Vitamin D ==================================================================== For clinical consultation, please call [phone]. ====================================================================       ROS  Constitutional: Denies any fever or chills Gastrointestinal: No reported hemesis, hematochezia, vomiting, or acute GI distress Musculoskeletal: Denies any acute onset joint swelling, redness, loss of ROM, or weakness Neurological: No reported episodes of acute onset apraxia, aphasia, dysarthria, agnosia, amnesia, paralysis, loss of coordination, or loss of consciousness  Medication Review  Lysine, Vitamin D, acetaminophen, allopurinol, amLODipine, aspirin, fluticasone, gabapentin, hydrALAZINE, irbesartan, levothyroxine, methylPREDNISolone, omeprazole, rosuvastatin, senna, torsemide, traMADol, and valACYclovir  History Review  Allergy: Mr. Makin is allergic to tape. Drug: Mr. Vandeventer  reports no history of drug use. Alcohol:  reports  no history of alcohol use. Tobacco:  reports that he has never smoked. He has never used smokeless tobacco. Social: Mr. Mcginness  reports that he has never smoked. He has never used smokeless tobacco. He reports that he does not drink alcohol and does not use drugs. Medical:  has a past medical history of Acute postoperative pain (03/21/2018), Anemia, Arthritis, Barrett esophagus, Cancer (HCC), Chicken pox, Diverticulitis, Dysrhythmia, GERD (gastroesophageal reflux disease), Hyperlipidemia, Hypertension, Hypothyroidism, Melanoma (HCC), Sleep apnea, and Ulcer. Surgical: Mr. Kathan  has a past surgical history that includes Prostate surgery; cystocopy (2003); Hemorrhoid surgery; Cardiac catheterization; sleep study; Colon surgery (2006-2008-2011); Cataract Surgery (Right, 02/13/14); Esophagogastroduodenoscopy (egd) with propofol (N/A, 08/04/2016); Colonoscopy w/ polypectomy; Colonoscopy with propofol (N/A, 02/15/2018); Colonoscopy with propofol (N/A, 03/24/2020); Esophagogastroduodenoscopy (egd) with propofol (N/A, 03/24/2020); and Cystoscopy/retrograde/ureteroscopy/stone extraction with basket (Right, 10/28/2020). Family: family history includes Breast cancer in his daughter; Heart disease in his father; Kidney disease in his father; Liver disease in his mother.  Laboratory Chemistry Profile   Renal Lab Results  Component Value Date   BUN 17 08/20/2022   CREATININE 1.34 08/20/2022   BCR 10 06/13/2017   GFR 48.41 (L) 08/20/2022   GFRAA >60 01/23/2018   GFRNONAA 38 (L) 10/18/2020    Hepatic Lab Results  Component Value Date   AST 26 08/20/2022   ALT 25 08/20/2022   ALBUMIN 4.1 08/20/2022   ALKPHOS 63 08/20/2022   LIPASE 27 10/18/2020    Electrolytes Lab Results  Component Value Date   NA 139 08/20/2022   K 4.2 08/20/2022   CL 103 08/20/2022   CALCIUM 9.5 08/20/2022   MG 2.0 06/13/2017    Bone Lab Results  Component Value Date   VD25OH 33.78 04/06/2022   25OHVITD1 20 (L) 06/13/2017   25OHVITD2  <1.0 06/13/2017   25OHVITD3 20 06/13/2017    Inflammation (CRP: Acute Phase) (ESR: Chronic Phase) Lab Results  Component Value Date   CRP 2.8 06/13/2017   ESRSEDRATE 13 06/13/2017         Note: Above Lab results reviewed.  Recent Imaging Review  DG PAIN CLINIC C-ARM 1-60 MIN NO REPORT Fluoro was used, but no Radiologist interpretation will be provided.  Please refer to "NOTES" tab for provider progress note. Note: Reviewed        Physical Exam  General appearance: Well nourished, well developed, and well hydrated. In no apparent acute distress Mental status: Alert, oriented x 3 (person, place, & time)       Respiratory: No evidence of acute respiratory distress Eyes: PERLA Vitals: There were no vitals taken for this visit. BMI: Estimated body mass index is 39.68 kg/m as calculated from the following:   Height as of 12/06/22: 5\' 8"  (1.727 m).   Weight as of 12/06/22: 261 lb (118.4 kg). Ideal: Patient weight not recorded  Assessment   Diagnosis Status  No diagnosis found. Controlled Controlled Controlled   Updated Problems: No problems updated.  Plan of Care  Problem-specific:  No problem-specific Assessment & Plan notes found for this encounter.  Mr. ZEPHYR RIDLEY has a current medication list which includes the following long-term medication(s): allopurinol, amlodipine, fluticasone, gabapentin, hydralazine, irbesartan, levothyroxine, omeprazole, rosuvastatin, torsemide, and tramadol.  Pharmacotherapy (Medications Ordered): No orders of the defined types were placed in this encounter.  Orders:  No orders of the defined types were placed in this encounter.  Follow-up plan:   No follow-ups on file.      Interventional Therapies  Risk  Complexity Considerations:   Estimated body mass index is 39.53 kg/m as calculated from the following:   Height as of this encounter: 5\' 8"  (1.727 m).   Weight as of this encounter: 260 lb (117.9 kg). POOR Candidate for any more  RFA due to movement and body habitus.   Planned  Pending:   Therapeutic right caudal ESI #1 (04/20/2022)    Under consideration:   Diagnostic/therapeutic bilateral L5 TFESI #1    Completed:   Diagnostic midline-left caudal ESI x3 (08/27/2021) (100/100/90/90)  Palliative right lumbar facet MBB (L2-S1) x3 (08/07/2020) (100/100/100/100)  Palliative left lumbar facet MBB (L2-S1) x2 (10/27/2017) (100/100/95/75-100)  Palliative right lumbar facet RFA (L2-S1) x1 (04/18/2018) (100/100/100/100)  Palliative left lumbar facet RFA (L2-S1) x1 (03/21/2018) (75/75/80/80)    Completed by other providers:   None at this time   Therapeutic  Palliative (PRN) options:   Diagnostic midline caudal ESI #3  Palliative right lumbar facet block #4  Palliative left lumbar facet block #3  Palliative right lumbar facet RFA #2  Palliative left lumbar facet RFA #2    Pharmacotherapy  Nonopioids transferred 05/07/2020: Calcium and vitamin D3        Recent Visits No visits were found meeting these conditions. Showing recent visits within past 90 days and meeting all  other requirements Future Appointments Date Type Provider Dept  01/26/23 Appointment Delano Metz, MD [facility]  Showing future appointments within next 90 days and meeting all other requirements  I discussed the assessment and treatment plan with the patient. The patient was provided an opportunity to ask questions and all were answered. The patient agreed with the plan and demonstrated an understanding of the instructions.  Patient advised to call back or seek an in-person evaluation if the symptoms or condition worsens.  Duration of encounter: *** minutes.  Total time on encounter, as per AMA guidelines included both the face-to-face and non-face-to-face time personally spent by the physician and/or other qualified health care professional(s) on the day of the encounter (includes time in activities that require the physician  or other qualified health care professional and does not include time in activities normally performed by clinical staff). Physician's time may include the following activities when performed: Preparing to see the patient (e.g., pre-charting review of records, searching for previously ordered imaging, lab work, and nerve conduction tests) Review of prior analgesic pharmacotherapies. Reviewing PMP Interpreting ordered tests (e.g., lab work, imaging, nerve conduction tests) Performing post-procedure evaluations, including interpretation of diagnostic procedures Obtaining and/or reviewing separately obtained history Performing a medically appropriate examination and/or evaluation Counseling and educating the patient/family/caregiver Ordering medications, tests, or procedures Referring and communicating with other health care professionals (when not separately reported) Documenting clinical information in the electronic or other health record Independently interpreting results (not separately reported) and communicating results to the patient/ family/caregiver Care coordination (not separately reported)  Note by: Oswaldo Done, MD Date: 01/26/2023; Time: 6:01 AM

## 2023-01-26 ENCOUNTER — Encounter: Payer: Self-pay | Admitting: Pain Medicine

## 2023-01-26 ENCOUNTER — Ambulatory Visit: Payer: Medicare Other | Attending: Pain Medicine | Admitting: Pain Medicine

## 2023-01-26 VITALS — HR 72 | Temp 97.5°F | Resp 18 | Ht 68.0 in | Wt 262.0 lb

## 2023-01-26 DIAGNOSIS — M47816 Spondylosis without myelopathy or radiculopathy, lumbar region: Secondary | ICD-10-CM | POA: Diagnosis not present

## 2023-01-26 DIAGNOSIS — Z79891 Long term (current) use of opiate analgesic: Secondary | ICD-10-CM | POA: Diagnosis not present

## 2023-01-26 DIAGNOSIS — F1120 Opioid dependence, uncomplicated: Secondary | ICD-10-CM | POA: Diagnosis present

## 2023-01-26 DIAGNOSIS — G8929 Other chronic pain: Secondary | ICD-10-CM | POA: Insufficient documentation

## 2023-01-26 DIAGNOSIS — M4310 Spondylolisthesis, site unspecified: Secondary | ICD-10-CM | POA: Diagnosis not present

## 2023-01-26 DIAGNOSIS — M4317 Spondylolisthesis, lumbosacral region: Secondary | ICD-10-CM | POA: Diagnosis not present

## 2023-01-26 DIAGNOSIS — Z79899 Other long term (current) drug therapy: Secondary | ICD-10-CM | POA: Insufficient documentation

## 2023-01-26 DIAGNOSIS — M5442 Lumbago with sciatica, left side: Secondary | ICD-10-CM | POA: Insufficient documentation

## 2023-01-26 DIAGNOSIS — G894 Chronic pain syndrome: Secondary | ICD-10-CM | POA: Insufficient documentation

## 2023-01-26 DIAGNOSIS — M79605 Pain in left leg: Secondary | ICD-10-CM | POA: Insufficient documentation

## 2023-01-26 DIAGNOSIS — Z8546 Personal history of malignant neoplasm of prostate: Secondary | ICD-10-CM | POA: Diagnosis present

## 2023-01-26 DIAGNOSIS — M5137 Other intervertebral disc degeneration, lumbosacral region: Secondary | ICD-10-CM | POA: Diagnosis not present

## 2023-01-26 MED ORDER — NALOXONE HCL 4 MG/0.1ML NA LIQD
1.0000 | NASAL | 0 refills | Status: AC | PRN
Start: 2023-01-26 — End: 2024-01-26

## 2023-01-26 MED ORDER — TRAMADOL HCL 50 MG PO TABS: 50.0000 mg | ORAL_TABLET | Freq: Three times a day (TID) | ORAL | 5 refills | Status: DC

## 2023-01-26 NOTE — Progress Notes (Signed)
Nursing Pain Medication Assessment:  Safety precautions to be maintained throughout the outpatient stay will include: orient to surroundings, keep bed in low position, maintain call bell within reach at all times, provide assistance with transfer out of bed and ambulation.  Medication Inspection Compliance: Pill count conducted under aseptic conditions, in front of the patient. Neither the pills nor the bottle was removed from the patient's sight at any time. Once count was completed pills were immediately returned to the patient in their original bottle.  Medication: Tramadol (Ultram) Pill/Patch Count:  37 of 90 pills remain Pill/Patch Appearance: Markings consistent with prescribed medication Bottle Appearance: Standard pharmacy container. Clearly labeled. Filled Date: 01 / 06 / 2024 Last Medication intake:  Today

## 2023-01-26 NOTE — Patient Instructions (Signed)
____________________________________________________________________________________________  Opioid Pain Medication Update  To: All patients taking opioid pain medications. (I.e.: hydrocodone, hydromorphone, oxycodone, oxymorphone, morphine, codeine, methadone, tapentadol, tramadol, buprenorphine, fentanyl, etc.)  Re: Updated review of side effects and adverse reactions of opioid analgesics, as well as new information about long term effects of this class of medications.  Direct risks of long-term opioid therapy are not limited to opioid addiction and overdose. Potential medical risks include serious fractures, breathing problems during sleep, hyperalgesia, immunosuppression, chronic constipation, bowel obstruction, myocardial infarction, and tooth decay secondary to xerostomia.  Unpredictable adverse effects that can occur even if you take your medication correctly: Cognitive impairment, respiratory depression, and death. Most people think that if they take their medication "correctly", and "as instructed", that they will be safe. Nothing could be farther from the truth. In reality, a significant amount of recorded deaths associated with the use of opioids has occurred in individuals that had taken the medication for a long time, and were taking their medication correctly. The following are examples of how this can happen: Patient taking his/her medication for a long time, as instructed, without any side effects, is given a certain antibiotic or another unrelated medication, which in turn triggers a "Drug-to-drug interaction" leading to disorientation, cognitive impairment, impaired reflexes, respiratory depression or an untoward event leading to serious bodily harm or injury, including death.  Patient taking his/her medication for a long time, as instructed, without any side effects, develops an acute impairment of liver and/or kidney function. This will lead to a rapid inability of the body to  breakdown and eliminate their pain medication, which will result in effects similar to an "overdose", but with the same medicine and dose that they had always taken. This again may lead to disorientation, cognitive impairment, impaired reflexes, respiratory depression or an untoward event leading to serious bodily harm or injury, including death.  A similar problem will occur with patients as they grow older and their liver and kidney function begins to decrease as part of the aging process.  Background information: Historically, the original case for using long-term opioid therapy to treat chronic noncancer pain was based on safety assumptions that subsequent experience has called into question. In 1996, the American Pain Society and the American Academy of Pain Medicine issued a consensus statement supporting long-term opioid therapy. This statement acknowledged the dangers of opioid prescribing but concluded that the risk for addiction was low; respiratory depression induced by opioids was short-lived, occurred mainly in opioid-naive patients, and was antagonized by pain; tolerance was not a common problem; and efforts to control diversion should not constrain opioid prescribing. This has now proven to be wrong. Experience regarding the risks for opioid addiction, misuse, and overdose in community practice has failed to support these assumptions.  According to the Centers for Disease Control and Prevention, fatal overdoses involving opioid analgesics have increased sharply over the past decade. Currently, more than 96,700 people die from drug overdoses every year. Opioids are a factor in 7 out of every 10 overdose deaths. Deaths from drug overdose have surpassed motor vehicle accidents as the leading cause of death for individuals between the ages of 35 and 54.  Clinical data suggest that neuroendocrine dysfunction may be very common in both men and women, potentially causing hypogonadism, erectile  dysfunction, infertility, decreased libido, osteoporosis, and depression. Recent studies linked higher opioid dose to increased opioid-related mortality. Controlled observational studies reported that long-term opioid therapy may be associated with increased risk for cardiovascular events. Subsequent meta-analysis concluded   that the safety of long-term opioid therapy in elderly patients has not been proven.   Side Effects and adverse reactions: Common side effects: Drowsiness (sedation). Dizziness. Nausea and vomiting. Constipation. Physical dependence -- Dependence often manifests with withdrawal symptoms when opioids are discontinued or decreased. Tolerance -- As you take repeated doses of opioids, you require increased medication to experience the same effect of pain relief. Respiratory depression -- This can occur in healthy people, especially with higher doses. However, people with COPD, asthma or other lung conditions may be even more susceptible to fatal respiratory impairment.  Uncommon side effects: An increased sensitivity to feeling pain and extreme response to pain (hyperalgesia). Chronic use of opioids can lead to this. Delayed gastric emptying (the process by which the contents of your stomach are moved into your small intestine). Muscle rigidity. Immune system and hormonal dysfunction. Quick, involuntary muscle jerks (myoclonus). Arrhythmia. Itchy skin (pruritus). Dry mouth (xerostomia).  Long-term side effects: Chronic constipation. Sleep-disordered breathing (SDB). Increased risk of bone fractures. Hypothalamic-pituitary-adrenal dysregulation. Increased risk of overdose.  RISKS: Fractures and Falls:  Opioids increase the risk and incidence of falls. This is of particular importance in elderly patients.  Endocrine System:  Long-term administration is associated with endocrine abnormalities (endocrinopathies). (Also known as Opioid-induced Endocrinopathy) Influences  on both the hypothalamic-pituitary-adrenal axis?and the hypothalamic-pituitary-gonadal axis have been demonstrated with consequent hypogonadism and adrenal insufficiency in both sexes. Hypogonadism and decreased levels of dehydroepiandrosterone sulfate have been reported in men and women. Endocrine effects include: Amenorrhoea in women (abnormal absence of menstruation) Reduced libido in both sexes Decreased sexual function Erectile dysfunction in men Hypogonadisms (decreased testicular function with shrinkage of testicles) Infertility Depression and fatigue Loss of muscle mass Anxiety Depression Immune suppression Hyperalgesia Weight gain Anemia Osteoporosis Patients (particularly women of childbearing age) should avoid opioids. There is insufficient evidence to recommend routine monitoring of asymptomatic patients taking opioids in the long-term for hormonal deficiencies.  Immune System: Human studies have demonstrated that opioids have an immunomodulating effect. These effects are mediated via opioid receptors both on immune effector cells and in the central nervous system. Opioids have been demonstrated to have adverse effects on antimicrobial response and anti-tumour surveillance. Buprenorphine has been demonstrated to have no impact on immune function.  Opioid Induced Hyperalgesia: Human studies have demonstrated that prolonged use of opioids can lead to a state of abnormal pain sensitivity, sometimes called opioid induced hyperalgesia (OIH). Opioid induced hyperalgesia is not usually seen in the absence of tolerance to opioid analgesia. Clinically, hyperalgesia may be diagnosed if the patient on long-term opioid therapy presents with increased pain. This might be qualitatively and anatomically distinct from pain related to disease progression or to breakthrough pain resulting from development of opioid tolerance. Pain associated with hyperalgesia tends to be more diffuse than the  pre-existing pain and less defined in quality. Management of opioid induced hyperalgesia requires opioid dose reduction.  Cancer: Chronic opioid therapy has been associated with an increased risk of cancer among noncancer patients with chronic pain. This association was more evident in chronic strong opioid users. Chronic opioid consumption causes significant pathological changes in the small intestine and colon. Epidemiological studies have found that there is a link between opium dependence and initiation of gastrointestinal cancers. Cancer is the second leading cause of death after cardiovascular disease. Chronic use of opioids can cause multiple conditions such as GERD, immunosuppression and renal damage as well as carcinogenic effects, which are associated with the incidence of cancers.   Mortality: Long-term opioid use   has been associated with increased mortality among patients with chronic non-cancer pain (CNCP).  Prescription of long-acting opioids for chronic noncancer pain was associated with a significantly increased risk of all-cause mortality, including deaths from causes other than overdose.  Reference: Von Korff M, Kolodny A, Deyo RA, Chou R. Long-term opioid therapy reconsidered. Ann Intern Med. 2011 Sep 6;155(5):325-8. doi: 10.7326/[id number]. PMID: 21893626; PMCID: PMC3280085. Bedson J, Chen Y, Ashworth J, Hayward RA, Dunn KM, Jordan KP. Risk of adverse events in patients prescribed long-term opioids: A cohort study in the [place] Clinical Practice Research Datalink. Eur J Pain. 2019 May;23(5):908-922. doi: 10.1002/ejp.1357. Epub 2019 Jan 31. PMID: 30620116. Colameco S, Coren JS, Ciervo CA. Continuous opioid treatment for chronic noncancer pain: a time for moderation in prescribing. Postgrad Med. 2009 Jul;121(4):61-6. doi: 10.3810/pgm.2009.07.2032. PMID: 19641271. Chou R, Turner JA, Devine EB, Hansen RN, Sullivan SD, Blazina I, Dana T, Bougatsos C, Deyo RA. The  effectiveness and risks of long-term opioid therapy for chronic pain: a systematic review for a National Institutes of Health Pathways to Prevention Workshop. Ann Intern Med. 2015 Feb 17;162(4):276-86. doi: 10.7326/M14-2559. PMID: 25581257. Warner M, Chen LH, Makuc DM. NCHS Data Brief No. 22. [place]: Centers for Disease Control and Prevention; 2009. Sep, Increase in Fatal Poisonings Involving Opioid Analgesics in the [place], 1999-2006. Song IA, Choi HR, Oh TK. Long-term opioid use and mortality in patients with chronic non-cancer pain: Ten-year follow-up study in [place] from 2010 through 2019. EClinicalMedicine. 2022 Jul 18;51:101558. doi: 10.1016/j.eclinm.2022.101558. PMID: 35875817; PMCID: PMC9304910. Huser, W., Schubert, T., Vogelmann, T. et al. All-cause mortality in patients with long-term opioid therapy compared with non-opioid analgesics for chronic non-cancer pain: a database study. BMC Med 18, 162 (2020). https://doi.org/10.1186/[email or web address] Rashidian H, Zendehdel K, Kamangar F, Malekzadeh R, Haghdoost AA. An Ecological Study of the Association between Opiate Use and Incidence of Cancers. Addict Health. 2016 Fall;8(4):252-260. PMID: 28819556; PMCID: PMC5554805.  Our Goal: Our goal is to control your pain with means other than the use of opioid pain medications.  Our Recommendation: Talk to your physician about coming off of these medications. We can assist you with the tapering down and stopping these medicines. Based on the new information, even if you cannot completely stop the medication, a decrease in the dose may be associated with a lesser risk. Ask for other means of controlling the pain. Decrease or eliminate those factors that significantly contribute to your pain such as smoking, obesity, and a diet heavily tilted towards "inflammatory" nutrients.  Last Updated: 09/29/2022    ____________________________________________________________________________________________     ____________________________________________________________________________________________  Transfer of Pain Medication between Pharmacies  Re: 2023 DEA Clarification on existing regulation  Published on DEA Website: April 02, 2022  Title: Revised Regulation Allows DEA-Registered Pharmacies to Transfer Electronic Prescriptions at a Patient's Request DEA Headquarters Division - Public Information Office  "Patients now have the ability to request their electronic prescription be transferred to another pharmacy without having to go back to their practitioner to initiate the request. This revised regulation went into effect on Monday, March 29, 2022.     At a patient's request, a DEA-registered retail pharmacy can now transfer an electronic prescription for a controlled substance (schedules II-V) to another DEA-registered retail pharmacy. Prior to this change, patients would have to go through their practitioner to cancel their prescription and have it re-issued to a different pharmacy. The process was taxing and time consuming for both patients and practitioners.    The Drug Enforcement Administration (DEA) published its intent to revise the   process for transferring electronic prescriptions on June 20, 2020.  The final rule was published in the federal register on February 25, 2022 and went into effect 30 days later.  Under the final rule, a prescription can only be transferred once between pharmacies, and only if allowed under existing state or other applicable law. The prescription must remain in its electronic form; may not be altered in any way; and the transfer must be communicated directly between two licensed pharmacists. It's important to note, any authorized refills transfer with the original prescription, which means the entire prescription will be filled at the same pharmacy."     REFERENCES: 1. DEA website announcement https://www.dea.gov/stories/2023/2023-04/2022-09-01/revised-regulation-allows-dea-registered-pharmacies-transfer  2. Department of Justice website  https://www.govinfo.gov/content/pkg/FR-2022-02-25/pdf/2023-15847.pdf  3. DEPARTMENT OF JUSTICE Drug Enforcement Administration 21 CFR Part 1306 [Docket No. [id number]-637] RIN [id number] "Transfer of Electronic Prescriptions for Schedules II-V Controlled Substances Between Pharmacies for Initial Filling"  ____________________________________________________________________________________________     _______________________________________________________________________  Medication Rules  Purpose: To inform patients, and their family members, of our medication rules and regulations.  Applies to: All patients receiving prescriptions from our practice (written or electronic).  Pharmacy of record: This is the pharmacy where your electronic prescriptions will be sent. Make sure we have the correct one.  Electronic prescriptions: In compliance with the [place] Strengthen Opioid Misuse Prevention (STOP) Act of 2017 (Session Law 2017-74/H243), effective August 02, 2018, all controlled substances must be electronically prescribed. Written prescriptions, faxing, or calling prescriptions to a pharmacy will no longer be done.  Prescription refills: These will be provided only during in-person appointments. No medications will be renewed without a "face-to-face" evaluation with your provider. Applies to all prescriptions.  NOTE: The following applies primarily to controlled substances (Opioid* Pain Medications).   Type of encounter (visit): For patients receiving controlled substances, face-to-face visits are required. (Not an option and not up to the patient.)  Patient's responsibilities: Pain Pills: Bring all pain pills to every appointment (except for procedure appointments). Pill Bottles: Bring  pills in original pharmacy bottle. Bring bottle, even if empty. Always bring the bottle of the most recent fill.  Medication refills: You are responsible for knowing and keeping track of what medications you are taking and when is it that you will need a refill. The day before your appointment: write a list of all prescriptions that need to be refilled. The day of the appointment: give the list to the admitting nurse. Prescriptions will be written only during appointments. No prescriptions will be written on procedure days. If you forget a medication: it will not be "Called in", "Faxed", or "electronically sent". You will need to get another appointment to get these prescribed. No early refills. Do not call asking to have your prescription filled early. Partial  or short prescriptions: Occasionally your pharmacy may not have enough pills to fill your prescription.  NEVER ACCEPT a partial fill or a prescription that is short of the total amount of pills that you were prescribed.  With controlled substances the law allows 72 hours for the pharmacy to complete the prescription.  If the prescription is not completed within 72 hours, the pharmacist will require a new prescription to be written. This means that you will be short on your medicine and we WILL NOT send another prescription to complete your original prescription.  Instead, request the pharmacy to send a carrier to a nearby branch to get enough medication to provide you with your full prescription. Prescription Accuracy: You are responsible for carefully inspecting your   prescriptions before leaving our office. Have the discharge nurse carefully go over each prescription with you, before taking them home. Make sure that your name is accurately spelled, that your address is correct. Check the name and dose of your medication to make sure it is accurate. Check the number of pills, and the written instructions to make sure they are clear and accurate. Make  sure that you are given enough medication to last until your next medication refill appointment. Taking Medication: Take medication as prescribed. When it comes to controlled substances, taking less pills or less frequently than prescribed is permitted and encouraged. Never take more pills than instructed. Never take the medication more frequently than prescribed.  Inform other Doctors: Always inform, all of your healthcare providers, of all the medications you take. Pain Medication from other Providers: You are not allowed to accept any additional pain medication from any other Doctor or Healthcare provider. There are two exceptions to this rule. (see below) In the event that you require additional pain medication, you are responsible for notifying us, as stated below. Cough Medicine: Often these contain an opioid, such as codeine or hydrocodone. Never accept or take cough medicine containing these opioids if you are already taking an opioid* medication. The combination may cause respiratory failure and death. Medication Agreement: You are responsible for carefully reading and following our Medication Agreement. This must be signed before receiving any prescriptions from our practice. Safely store a copy of your signed Agreement. Violations to the Agreement will result in no further prescriptions. (Additional copies of our Medication Agreement are available upon request.) Laws, Rules, & Regulations: All patients are expected to follow all Federal and State Laws, Statutes, Rules, & Regulations. Ignorance of the Laws does not constitute a valid excuse.  Illegal drugs and Controlled Substances: The use of illegal substances (including, but not limited to marijuana and its derivatives) and/or the illegal use of any controlled substances is strictly prohibited. Violation of this rule may result in the immediate and permanent discontinuation of any and all prescriptions being written by our practice. The use of  any illegal substances is prohibited. Adopted CDC guidelines & recommendations: Target dosing levels will be at or below 60 MME/day. Use of benzodiazepines** is not recommended.  Exceptions: There are only two exceptions to the rule of not receiving pain medications from other Healthcare Providers. Exception #1 (Emergencies): In the event of an emergency (i.e.: accident requiring emergency care), you are allowed to receive additional pain medication. However, you are responsible for: As soon as you are able, call our office [phone], at any time of the day or night, and leave a message stating your name, the date and nature of the emergency, and the name and dose of the medication prescribed. In the event that your call is answered by a member of our staff, make sure to document and save the date, time, and the name of the person that took your information.  Exception #2 (Planned Surgery): In the event that you are scheduled by another doctor or dentist to have any type of surgery or procedure, you are allowed (for a period no longer than 30 days), to receive additional pain medication, for the acute post-op pain. However, in this case, you are responsible for picking up a copy of our "Post-op Pain Management for Surgeons" handout, and giving it to your surgeon or dentist. This document is available at our office, and does not require an appointment to obtain it. Simply go to   our office during business hours (Monday-Thursday from 8:00 AM to 4:00 PM) (Friday 8:00 AM to 12:00 Noon) or if you have a scheduled appointment with us, prior to your surgery, and ask for it by name. In addition, you are responsible for: calling our office [phone], at any time of the day or night, and leaving a message stating your name, name of your surgeon, type of surgery, and date of procedure or surgery. Failure to comply with your responsibilities may result in termination of therapy involving the controlled  substances. Medication Agreement Violation. Following the above rules, including your responsibilities will help you in avoiding a Medication Agreement Violation ("Breaking your Pain Medication Contract").  Consequences:  Not following the above rules may result in permanent discontinuation of medication prescription therapy.  *Opioid medications include: morphine, codeine, oxycodone, oxymorphone, hydrocodone, hydromorphone, meperidine, tramadol, tapentadol, buprenorphine, fentanyl, methadone. **Benzodiazepine medications include: diazepam (Valium), alprazolam (Xanax), clonazepam (Klonopine), lorazepam (Ativan), clorazepate (Tranxene), chlordiazepoxide (Librium), estazolam (Prosom), oxazepam (Serax), temazepam (Restoril), triazolam (Halcion) (Last updated: 05/25/2022) ______________________________________________________________________    ______________________________________________________________________  Medication Recommendations and Reminders  Applies to: All patients receiving prescriptions (written and/or electronic).  Medication Rules & Regulations: You are responsible for reading, knowing, and following our "Medication Rules" document. These exist for your safety and that of others. They are not flexible and neither are we. Dismissing or ignoring them is an act of "non-compliance" that may result in complete and irreversible termination of such medication therapy. For safety reasons, "non-compliance" will not be tolerated. As with the [place] fundamental legal principle of "ignorance of the law is no defense", we will accept no excuses for not having read and knowing the content of documents provided to you by our practice.  Pharmacy of record:  Definition: This is the pharmacy where your electronic prescriptions will be sent.  We do not endorse any particular pharmacy. It is up to you and your insurance to decide what pharmacy to use.  We do not restrict you in your choice of  pharmacy. However, once we write for your prescriptions, we will NOT be re-sending more prescriptions to fix restricted supply problems created by your pharmacy, or your insurance.  The pharmacy listed in the electronic medical record should be the one where you want electronic prescriptions to be sent. If you choose to change pharmacy, simply notify our nursing staff. Changes will be made only during your regular appointments and not over the phone.  Recommendations: Keep all of your pain medications in a safe place, under lock and key, even if you live alone. We will NOT replace lost, stolen, or damaged medication. We do not accept "Police Reports" as proof of medications having been stolen. After you fill your prescription, take 1 week's worth of pills and put them away in a safe place. You should keep a separate, properly labeled bottle for this purpose. The remainder should be kept in the original bottle. Use this as your primary supply, until it runs out. Once it's gone, then you know that you have 1 week's worth of medicine, and it is time to come in for a prescription refill. If you do this correctly, it is unlikely that you will ever run out of medicine. To make sure that the above recommendation works, it is very important that you make sure your medication refill appointments are scheduled at least 1 week before you run out of medicine. To do this in an effective manner, make sure that you do not leave the office without   scheduling your next medication management appointment. Always ask the nursing staff to show you in your prescription , when your medication will be running out. Then arrange for the receptionist to get you a return appointment, at least 7 days before you run out of medicine. Do not wait until you have 1 or 2 pills left, to come in. This is very poor planning and does not take into consideration that we may need to cancel appointments due to bad weather, sickness, or emergencies  affecting our staff. DO NOT ACCEPT A "Partial Fill": If for any reason your pharmacy does not have enough pills/tablets to completely fill or refill your prescription, do not allow for a "partial fill". The law allows the pharmacy to complete that prescription within 72 hours, without requiring a new prescription. If they do not fill the rest of your prescription within those 72 hours, you will need a separate prescription to fill the remaining amount, which we will NOT provide. If the reason for the partial fill is your insurance, you will need to talk to the pharmacist about payment alternatives for the remaining tablets, but again, DO NOT ACCEPT A PARTIAL FILL, unless you can trust your pharmacist to obtain the remainder of the pills within 72 hours.  Prescription refills and/or changes in medication(s):  Prescription refills, and/or changes in dose or medication, will be conducted only during scheduled medication management appointments. (Applies to both, written and electronic prescriptions.) No refills on procedure days. No medication will be changed or started on procedure days. No changes, adjustments, and/or refills will be conducted on a procedure day. Doing so will interfere with the diagnostic portion of the procedure. No phone refills. No medications will be "called into the pharmacy". No Fax refills. No weekend refills. No Holliday refills. No after hours refills.  Remember:  Business hours are:  Monday to Thursday 8:00 AM to 4:00 PM Provider's Schedule: Elisha Cooksey, MD - Appointments are:  Medication management: Monday and Wednesday 8:00 AM to 4:00 PM Procedure day: Tuesday and Thursday 7:30 AM to 4:00 PM Bilal Lateef, MD - Appointments are:  Medication management: Tuesday and Thursday 8:00 AM to 4:00 PM Procedure day: Monday and Wednesday 7:30 AM to 4:00 PM (Last update: 05/25/2022) ______________________________________________________________________    ____________________________________________________________________________________________  Naloxone Nasal Spray  Why am I receiving this medication? [place] STOP ACT requires that all patients taking high dose opioids or at risk of opioids respiratory depression, be prescribed an opioid reversal agent, such as Naloxone (AKA: Narcan).  What is this medication? NALOXONE (nal OX one) treats opioid overdose, which causes slow or shallow breathing, severe drowsiness, or trouble staying awake. Call emergency services after using this medication. You may need additional treatment. Naloxone works by reversing the effects of opioids. It belongs to a group of medications called opioid blockers.  COMMON BRAND NAME(S): Kloxxado, Narcan  What should I tell my care team before I take this medication? They need to know if you have any of these conditions: Heart disease Substance use disorder An unusual or allergic reaction to naloxone, other medications, foods, dyes, or preservatives Pregnant or trying to get pregnant Breast-feeding  When to use this medication? This medication is to be used for the treatment of respiratory depression (less than 8 breaths per minute) secondary to opioid overdose.   How to use this medication? This medication is for use in the nose. Lay the person on their back. Support their neck with your hand and allow the head to tilt   back before giving the medication. The nasal spray should be given into 1 nostril. After giving the medication, move the person onto their side. Do not remove or test the nasal spray until ready to use. Get emergency medical help right away after giving the first dose of this medication, even if the person wakes up. You should be familiar with how to recognize the signs and symptoms of a narcotic overdose. If more doses are needed, give the additional dose in the other nostril. Talk to your care team about the use of this medication in children.  While this medication may be prescribed for children as young as newborns for selected conditions, precautions do apply.  Naloxone Overdosage: If you think you have taken too much of this medicine contact a poison control center or emergency room at once.  NOTE: This medicine is only for you. Do not share this medicine with others.  What if I miss a dose? This does not apply.  What may interact with this medication? This is only used during an emergency. No interactions are expected during emergency use. This list may not describe all possible interactions. Give your health care provider a list of all the medicines, herbs, non-prescription drugs, or dietary supplements you use. Also tell them if you smoke, drink alcohol, or use illegal drugs. Some items may interact with your medicine.  What should I watch for while using this medication? Keep this medication ready for use in the case of an opioid overdose. Make sure that you have the phone number of your care team and local hospital ready. You may need to have additional doses of this medication. Each nasal spray contains a single dose. Some emergencies may require additional doses. After use, bring the treated person to the nearest hospital or call 911. Make sure the treating care team knows that the person has received a dose of this medication. You will receive additional instructions on what to do during and after use of this medication before an emergency occurs.  What side effects may I notice from receiving this medication? Side effects that you should report to your care team as soon as possible: Allergic reactions--skin rash, itching, hives, swelling of the face, lips, tongue, or throat Side effects that usually do not require medical attention (report these to your care team if they continue or are bothersome): Constipation Dryness or irritation inside the nose Headache Increase in blood pressure Muscle spasms Stuffy  nose Toothache This list may not describe all possible side effects. Call your doctor for medical advice about side effects. You may report side effects to FDA at 1-800-FDA-1088.  Where should I keep my medication? Because this is an emergency medication, you should keep it with you at all times.  Keep out of the reach of children and pets. Store between 20 and 25 degrees C (68 and 77 degrees F). Do not freeze. Throw away any unused medication after the expiration date. Keep in original box until ready to use.  NOTE: This sheet is a summary. It may not cover all possible information. If you have questions about this medicine, talk to your doctor, pharmacist, or health care provider.   2023 Elsevier/Gold Standard (2021-03-27 00:00:00)  ____________________________________________________________________________________________   

## 2023-01-27 ENCOUNTER — Ambulatory Visit (INDEPENDENT_AMBULATORY_CARE_PROVIDER_SITE_OTHER): Payer: Medicare Other | Admitting: Internal Medicine

## 2023-01-27 ENCOUNTER — Other Ambulatory Visit: Payer: Self-pay | Admitting: Internal Medicine

## 2023-01-27 ENCOUNTER — Encounter: Payer: Self-pay | Admitting: Internal Medicine

## 2023-01-27 VITALS — BP 134/74 | HR 49 | Temp 98.3°F | Ht 68.0 in | Wt 265.4 lb

## 2023-01-27 DIAGNOSIS — R600 Localized edema: Secondary | ICD-10-CM | POA: Diagnosis not present

## 2023-01-27 DIAGNOSIS — E039 Hypothyroidism, unspecified: Secondary | ICD-10-CM | POA: Diagnosis not present

## 2023-01-27 DIAGNOSIS — R739 Hyperglycemia, unspecified: Secondary | ICD-10-CM

## 2023-01-27 DIAGNOSIS — Z86010 Personal history of colonic polyps: Secondary | ICD-10-CM | POA: Diagnosis not present

## 2023-01-27 DIAGNOSIS — N1831 Chronic kidney disease, stage 3a: Secondary | ICD-10-CM | POA: Diagnosis not present

## 2023-01-27 DIAGNOSIS — K2270 Barrett's esophagus without dysplasia: Secondary | ICD-10-CM

## 2023-01-27 DIAGNOSIS — Z8546 Personal history of malignant neoplasm of prostate: Secondary | ICD-10-CM

## 2023-01-27 DIAGNOSIS — I10 Essential (primary) hypertension: Secondary | ICD-10-CM | POA: Diagnosis not present

## 2023-01-27 DIAGNOSIS — M109 Gout, unspecified: Secondary | ICD-10-CM

## 2023-01-27 DIAGNOSIS — E7800 Pure hypercholesterolemia, unspecified: Secondary | ICD-10-CM

## 2023-01-27 MED ORDER — GABAPENTIN 100 MG PO CAPS: 100.0000 mg | ORAL_CAPSULE | Freq: Two times a day (BID) | ORAL | 1 refills | Status: DC

## 2023-01-27 MED ORDER — HYDRALAZINE HCL 50 MG PO TABS: 50.0000 mg | ORAL_TABLET | Freq: Two times a day (BID) | ORAL | 1 refills | Status: DC

## 2023-01-27 MED ORDER — IRBESARTAN 300 MG PO TABS: 300.0000 mg | ORAL_TABLET | Freq: Every day | ORAL | 1 refills | Status: DC

## 2023-01-27 NOTE — Progress Notes (Signed)
Subjective:    [name], male    DOB: Mar 03, 1935, 88 y.o.   MRN: [id number]  Patient here for  Chief Complaint  Patient presents with   Medical Management of Chronic Issues    2 month follow up     HPI Here to follow up regarding his blood pressure and cholesterol. He is accompanied by his daughter.  History obtained from both of them.  Limited mobility due to pain - back. Saw nephrology 09/27/22 - f/u CKD. Prescribed flomax to help with urinary issues. Taking torsemide prn. Averages 3 days per week. Discussed decreased salt/sodium intake. Had f/u with nephrology 01/05/23.  Weight up.  Increased urinary frequency.  Increased lower extremity swelling.  Recommended increase torsemide to 10mg  q day.  Also had f/u with rheumatology 01/10/23.  Recommended to increase allopurinol with goal uric acid <6.  Was also placed on prednisone - taper - for undifferentiated inflammatory arthritis and idiopathic gout.  Labs returned - states diagnosed with rheumatoid arthritis.  Is feeling better with prednisone treatment.  Getting around better.  Walked in to the office.     Past Medical History:  Diagnosis Date   Acute postoperative pain 03/21/2018   Anemia    Arthritis    Barrett esophagus    Cancer (HCC)    prostate,skin   Chicken pox    Diverticulitis    Dysrhythmia    GERD (gastroesophageal reflux disease)    Hyperlipidemia    Hypertension    Hypothyroidism    Melanoma (HCC)    Malignant resection   Sleep apnea    Ulcer    Past Surgical History:  Procedure Laterality Date   CARDIAC CATHETERIZATION     Cataract Surgery Right 02/13/14   COLON SURGERY  2006-2008-2011   polyps removed   COLONOSCOPY W/ POLYPECTOMY     COLONOSCOPY WITH PROPOFOL N/A 02/15/2018   Procedure: COLONOSCOPY WITH PROPOFOL;  Surgeon: Scot Jun, MD;  Location: [facility] ENDOSCOPY;  Service: Endoscopy;  Laterality: N/A;   COLONOSCOPY WITH PROPOFOL N/A 03/24/2020   Procedure: COLONOSCOPY WITH PROPOFOL;   Surgeon: Regis Bill, MD;  Location: [facility] ENDOSCOPY;  Service: Endoscopy;  Laterality: N/A;   cystocopy  2003   CYSTOSCOPY/RETROGRADE/URETEROSCOPY/STONE EXTRACTION WITH BASKET Right 10/28/2020   Procedure: CYSTOSCOPY/RETROGRADE PYELOGRAM/STONE EXTRACTION;  Surgeon: Riki Altes, MD;  Location: [facility] ORS;  Service: Urology;  Laterality: Right;   ESOPHAGOGASTRODUODENOSCOPY (EGD) WITH PROPOFOL N/A 08/04/2016   Procedure: ESOPHAGOGASTRODUODENOSCOPY (EGD) WITH PROPOFOL;  Surgeon: Scot Jun, MD;  Location[facility] END[facility]COPY;  Service: Endoscopy;  Laterality: N/A;   ESOPHAGOGASTRODUODENOSCOPY (EGD) WITH PROPOFOL N/A 03/24/2020   Procedure: ESOPHAGOGASTRODUODENOSCOPY (EGD) WITH PROPOFOL;  Surgeon: Regis Bill, MD;  Location: [facility] ENDOSCOPY;  Service: Endoscopy;  Laterality: N/A;   HEMORRHOID SURGERY     PROSTATE SURGERY     sleep study     Family History  Problem Relation Age of Onset   Liver disease Mother    Heart disease Father    Kidney disease Father    Breast cancer Daughter    Social History   Socioeconomic History   Marital status: Widowed    Spouse name: Not on file   Number of children: Not on file   Years of education: Not on file   Highest education level: 12th grade  Occupational History   Not on file  Tobacco Use   Smoking status: Never   Smokeless tobacco: Never  Vaping Use   Vaping Use: Never used  Substance and Sexual Activity   Alcohol use: No    Alcohol/week: 0.0 standard drinks of alcohol   Drug use: No   Sexual activity: Never  Other Topics Concern   Not on file  Social History Narrative   Not on file   Social Determinants of Health   Financial Resource Strain: Low Risk  (11/20/2022)   Overall Financial Resource Strain (CARDIA)    Difficulty of Paying Living Expenses: Not very hard  Food Insecurity: No Food Insecurity (11/20/2022)   Hunger Vital Sign    Worried About Running Out of Food in the Last Year: Never true    Ran Out of Food  in the Last Year: Never true  Transportation Needs: No Transportation Needs (11/20/2022)   PRAPARE - Administrator, Civil Service (Medical): No    Lack of Transportation (Non-Medical): No  Physical Activity: Unknown (11/20/2022)   Exercise Vital Sign    Days of Exercise per Week: 0 days    Minutes of Exercise per Session: Not on file  Stress: No Stress Concern Present (11/20/2022)   [employer] of Occupational Health - Occupational Stress Questionnaire    Feeling of Stress : Not at all  Social Connections: Unknown (11/20/2022)   Social Connection and Isolation Panel [NHANES]    Frequency of Communication with Friends and Family: More than three times a week    Frequency of Social Gatherings with Friends and Family: More than three times a week    Attends Religious Services: Patient declined    Database administrator or Organizations: Patient declined    Attends Banker Meetings: Not on file    Marital Status: Widowed     Review of Systems  Constitutional:  Negative for appetite change and fever.  HENT:  Negative for congestion and sinus pressure.   Respiratory:  Negative for cough and chest tightness.        Breathing stable.   Cardiovascular:  Negative for chest pain and palpitations.       Decreased lower extremity swelling.   Gastrointestinal:  Negative for abdominal pain, diarrhea, nausea and vomiting.  Genitourinary:  Negative for difficulty urinating and dysuria.  Musculoskeletal:  Positive for back pain. Negative for myalgias.  Skin:  Negative for color change and rash.  Neurological:  Negative for dizziness and headaches.  Psychiatric/Behavioral:  Negative for agitation and dysphoric mood.        Objective:     BP 134/74   Pulse (!) 49   Temp 98.3 F (36.8 C) (Oral)   Ht 5\' 8"  (1.727 m)   Wt 265 lb 6.4 oz (120.4 kg)   SpO2 97%   BMI 40.35 kg/m  Wt Readings from Last 3 Encounters:  01/27/23 265 lb 6.4 oz (120.4 kg)  01/26/23 262  lb (118.8 kg)  12/06/22 261 lb (118.4 kg)    Physical Exam Vitals reviewed.  Constitutional:      General: He is not in acute distress.    Appearance: Normal appearance. He is well-developed.  HENT:     Head: Normocephalic and atraumatic.     Right Ear: External ear normal.     Left Ear: External ear normal.  Eyes:     General: No scleral icterus.       Right eye: No discharge.        Left eye: No discharge.     Conjunctiva/sclera: Conjunctivae normal.  Cardiovascular:     Rate and Rhythm: Normal rate and regular rhythm.  Pulmonary:     Effort: Pulmonary effort is normal. No respiratory distress.     Breath sounds: Normal breath sounds.  Abdominal:     General: Bowel sounds are normal.     Palpations: Abdomen is soft.     Tenderness: There is no abdominal tenderness.  Musculoskeletal:        General: No tenderness.     Cervical back: Neck supple. No tenderness.     Comments: Lower extremity swelling - improved.   Lymphadenopathy:     Cervical: No cervical adenopathy.  Skin:    Findings: No erythema or rash.  Neurological:     Mental Status: He is alert.  Psychiatric:        Mood and Affect: Mood normal.        Behavior: Behavior normal.      Outpatient Encounter Medications as of 01/27/2023  Medication Sig   acetaminophen (TYLENOL) 500 MG tablet Take 500 mg by mouth every 6 (six) hours as needed.   allopurinol (ZYLOPRIM) 300 MG tablet Take 300 mg by mouth daily.   amLODipine (NORVASC) 10 MG tablet TAKE 1 TABLET BY MOUTH DAILY   aspirin 81 MG tablet Take 81 mg by mouth daily.   bisacodyl (DULCOLAX) 5 MG EC tablet Take 5 mg by mouth daily as needed for moderate constipation.   fluticasone (FLONASE) 50 MCG/ACT nasal spray TWO PUFFS IN EACH NOSTRIL ONCE A DAY   hydroxychloroquine (PLAQUENIL) 200 MG tablet Take 200 mg by mouth 2 (two) times daily.   levothyroxine (SYNTHROID) 125 MCG tablet Take 1 tablet (125 mcg total) by mouth daily.   methylPREDNISolone (MEDROL  DOSEPAK) 4 MG TBPK tablet Medrol dosepak 6 day taper.  Take as directed.   naloxone (NARCAN) nasal spray 4 mg/0.1 mL Place 1 spray into the nose as needed for up to 365 doses (for opioid-induced respiratory depresssion). In case of emergency (overdose), spray once into each nostril. If no response within 3 minutes, repeat application and call 911.   omeprazole (PRILOSEC) 20 MG capsule TAKE ONE CAPSULE BY MOUTH TWICE A DAY   senna (SENOKOT) 8.6 MG TABS tablet Take 2 tablets by mouth daily.   torsemide (DEMADEX) 10 MG tablet Take 10 mg by mouth daily as needed.   [START ON 02/03/2023] traMADol (ULTRAM) 50 MG tablet Take 1 tablet (50 mg total) by mouth in the morning, at noon, and at bedtime. Each refill must last 30 days.   [DISCONTINUED] gabapentin (NEURONTIN) 100 MG capsule Take 1 capsule (100 mg total) by mouth 2 (two) times daily.   [DISCONTINUED] hydrALAZINE (APRESOLINE) 50 MG tablet Take 1 tablet (50 mg total) by mouth 2 (two) times daily.   [DISCONTINUED] irbesartan (AVAPRO) 300 MG tablet Take 1 tablet (300 mg total) by mouth daily.   [DISCONTINUED] valACYclovir (VALTREX) 1000 MG tablet Take 1 tablet (1,000 mg total) by mouth daily.   gabapentin (NEURONTIN) 100 MG capsule Take 1 capsule (100 mg total) by mouth 2 (two) times daily.   hydrALAZINE (APRESOLINE) 50 MG tablet Take 1 tablet (50 mg total) by mouth 2 (two) times daily.   irbesartan (AVAPRO) 300 MG tablet Take 1 tablet (300 mg total) by mouth daily.   No facility-administered encounter medications on file as of 01/27/2023.     Lab Results  Component Value Date   WBC 8.4 06/01/2022   HGB 15.2 06/01/2022   HCT 46.7 06/01/2022   PLT 209.0 06/01/2022   GLUCOSE 89 01/24/2023   CHOL 189 01/24/2023   TRIG  203.0 (H) 01/24/2023   HDL 72.50 01/24/2023   LDLDIRECT 89.0 01/24/2023   LDLCALC 54 02/05/2021   ALT 47 01/24/2023   AST 32 01/24/2023   NA 141 01/24/2023   K 3.8 01/24/2023   CL 102 01/24/2023   CREATININE 1.44 01/24/2023    BUN 19 01/24/2023   CO2 31 01/24/2023   TSH 2.89 01/24/2023   PSA 0.00 (L) 06/16/2020   HGBA1C 5.7 01/24/2023    DG PAIN CLINIC C-ARM 1-60 MIN NO REPORT  Result Date: 05/13/2022 Fluoro was used, but no Radiologist interpretation will be provided. Please refer to "NOTES" tab for provider progress note.      Assessment & Plan:  Primary hypertension Assessment & Plan: Taking hydralazine, avapro and amlodipine.  Taking hydralazine to 50mg  bid.  Follow pressures. Follow metabolic panel.   Orders: -     Basic metabolic panel; Future  Hypothyroidism, unspecified type Assessment & Plan: On thyroid replacement.  Follow tsh.   Orders: -     TSH; Future  Hypercholesterolemia Assessment & Plan: Low cholesterol diet and exercise. Crestor.  Follow lipid panel and liver function tests.   Orders: -     Lipid panel; Future -     Basic metabolic panel; Future -     Hepatic function panel; Future  Barrett's esophagus without dysplasia Assessment & Plan: No upper symptoms.  On prilosec.  Stable. Has been followed by GI.    Stage 3a chronic kidney disease (HCC) Assessment & Plan: Continues on avapro.  Avoid antiinflammatories.  Follow metabolic panel. Renal ultrasound - no acute abnormality. Had f/u with nephrology 01/05/23.  Weight up.  Increased urinary frequency.  Increased lower extremity swelling.  Recommended increase torsemide to 10mg  q day. Did not tolerate flomax.    Gout, unspecified cause, unspecified chronicity, unspecified site Assessment & Plan: Has a history  of gout.  Has been on allopurinol.  Rheumatology recently increased allopurinal to 300mg  q day.    History of colonic polyps Assessment & Plan: Colonoscopy 03/2020 - multiple polyps.  Recommended f/u in one year.  Needs f/u.    History of prostate cancer Assessment & Plan: S/p prostatectomy.  Followed by Dr Lonna Cobb.     Hyperglycemia Assessment & Plan: Low carb diet and exercise.  Follow met b and a1c.     Lower extremity edema Assessment & Plan: Swelling improved.  On torsemide daily now.  Follow metabolic panel.  Weight daily.    Other orders -     Gabapentin; Take 1 capsule (100 mg total) by mouth 2 (two) times daily.  Dispense: 180 capsule; Refill: 1 -     hydrALAZINE HCl; Take 1 tablet (50 mg total) by mouth 2 (two) times daily.  Dispense: 180 tablet; Refill: 1 -     Irbesartan; Take 1 tablet (300 mg total) by mouth daily.  Dispense: 90 tablet; Refill: 1     Dale Canal [place], MD

## 2023-01-30 ENCOUNTER — Encounter: Payer: Self-pay | Admitting: Internal Medicine

## 2023-01-30 NOTE — Assessment & Plan Note (Signed)
Colonoscopy 03/2020 - multiple polyps.  Recommended f/u in one year.  Needs f/u.  

## 2023-01-30 NOTE — Assessment & Plan Note (Signed)
S/p prostatectomy.  Followed by Dr Stoioff.   

## 2023-01-30 NOTE — Assessment & Plan Note (Addendum)
Has a history  of gout.  Has been on allopurinol.  Rheumatology recently increased allopurinal to 300mg  q day.

## 2023-01-30 NOTE — Assessment & Plan Note (Signed)
Continues on avapro.  Avoid antiinflammatories.  Follow metabolic panel. Renal ultrasound - no acute abnormality. Had f/u with nephrology 01/05/23.  Weight up.  Increased urinary frequency.  Increased lower extremity swelling.  Recommended increase torsemide to 10mg  q day. Did not tolerate flomax.

## 2023-01-30 NOTE — Assessment & Plan Note (Signed)
Swelling improved.  On torsemide daily now.  Follow metabolic panel.  Weight daily.

## 2023-01-30 NOTE — Assessment & Plan Note (Signed)
No upper symptoms.  On prilosec.  Stable. Has been followed by GI.  

## 2023-01-30 NOTE — Assessment & Plan Note (Signed)
Taking hydralazine, avapro and amlodipine.  Taking hydralazine to 50mg bid.  Follow pressures. Follow metabolic panel.  

## 2023-01-30 NOTE — Assessment & Plan Note (Signed)
Low carb diet and exercise.  Follow met b and a1c.   

## 2023-01-30 NOTE — Assessment & Plan Note (Signed)
On thyroid replacement.  Follow tsh.  

## 2023-01-30 NOTE — Assessment & Plan Note (Signed)
Low cholesterol diet and exercise.  Crestor.  Follow lipid panel and liver function tests.   

## 2023-02-02 DIAGNOSIS — M0579 Rheumatoid arthritis with rheumatoid factor of multiple sites without organ or systems involvement: Secondary | ICD-10-CM | POA: Diagnosis not present

## 2023-02-02 DIAGNOSIS — Z79899 Other long term (current) drug therapy: Secondary | ICD-10-CM | POA: Diagnosis not present

## 2023-02-02 DIAGNOSIS — M1A39X0 Chronic gout due to renal impairment, multiple sites, without tophus (tophi): Secondary | ICD-10-CM | POA: Diagnosis not present

## 2023-02-02 LAB — TOXASSURE SELECT 13 (MW), URINE

## 2023-02-09 ENCOUNTER — Other Ambulatory Visit: Payer: Self-pay | Admitting: Internal Medicine

## 2023-02-14 DIAGNOSIS — I129 Hypertensive chronic kidney disease with stage 1 through stage 4 chronic kidney disease, or unspecified chronic kidney disease: Secondary | ICD-10-CM | POA: Diagnosis not present

## 2023-02-14 DIAGNOSIS — R801 Persistent proteinuria, unspecified: Secondary | ICD-10-CM | POA: Diagnosis not present

## 2023-02-14 DIAGNOSIS — R600 Localized edema: Secondary | ICD-10-CM | POA: Diagnosis not present

## 2023-02-14 DIAGNOSIS — N1831 Chronic kidney disease, stage 3a: Secondary | ICD-10-CM | POA: Diagnosis not present

## 2023-03-14 DIAGNOSIS — Z79899 Other long term (current) drug therapy: Secondary | ICD-10-CM | POA: Diagnosis not present

## 2023-03-14 DIAGNOSIS — M1A39X0 Chronic gout due to renal impairment, multiple sites, without tophus (tophi): Secondary | ICD-10-CM | POA: Diagnosis not present

## 2023-03-14 DIAGNOSIS — M0579 Rheumatoid arthritis with rheumatoid factor of multiple sites without organ or systems involvement: Secondary | ICD-10-CM | POA: Diagnosis not present

## 2023-03-14 DIAGNOSIS — M47816 Spondylosis without myelopathy or radiculopathy, lumbar region: Secondary | ICD-10-CM | POA: Diagnosis not present

## 2023-03-31 ENCOUNTER — Encounter: Payer: Self-pay | Admitting: Internal Medicine

## 2023-03-31 DIAGNOSIS — M47816 Spondylosis without myelopathy or radiculopathy, lumbar region: Secondary | ICD-10-CM

## 2023-03-31 DIAGNOSIS — R29898 Other symptoms and signs involving the musculoskeletal system: Secondary | ICD-10-CM

## 2023-03-31 NOTE — Telephone Encounter (Signed)
DME printed for your signature

## 2023-03-31 NOTE — Telephone Encounter (Signed)
Signed and placed in box.   

## 2023-03-31 NOTE — Telephone Encounter (Signed)
Ok to complete DME for a wheel chair

## 2023-03-31 NOTE — Telephone Encounter (Signed)
ok 

## 2023-04-27 ENCOUNTER — Encounter: Payer: Self-pay | Admitting: Internal Medicine

## 2023-04-27 NOTE — Telephone Encounter (Signed)
Ok to order attached labs to be drawn here and forwarded to Dr Thedore Mins.  Also, I am ok with palliative care referral if they are agreeable.

## 2023-04-28 ENCOUNTER — Other Ambulatory Visit (INDEPENDENT_AMBULATORY_CARE_PROVIDER_SITE_OTHER): Payer: Medicare Other

## 2023-04-28 ENCOUNTER — Telehealth: Payer: Self-pay

## 2023-04-28 DIAGNOSIS — I10 Essential (primary) hypertension: Secondary | ICD-10-CM

## 2023-04-28 DIAGNOSIS — E039 Hypothyroidism, unspecified: Secondary | ICD-10-CM

## 2023-04-28 DIAGNOSIS — N1831 Chronic kidney disease, stage 3a: Secondary | ICD-10-CM

## 2023-04-28 DIAGNOSIS — E7800 Pure hypercholesterolemia, unspecified: Secondary | ICD-10-CM

## 2023-04-28 LAB — LIPID PANEL
Cholesterol: 172 mg/dL (ref 0–200)
HDL: 52.7 mg/dL (ref >39.00)
LDL Cholesterol: 67 mg/dL (ref 0–99)
NonHDL: 118.97
Total CHOL/HDL Ratio: 3
Triglycerides: 259 mg/dL — ABNORMAL HIGH (ref 0.0–149.0)
VLDL: 51.8 mg/dL — ABNORMAL HIGH (ref 0.0–40.0)

## 2023-04-28 LAB — URINALYSIS, ROUTINE W REFLEX MICROSCOPIC
Bilirubin Urine: NEGATIVE
Ketones, ur: NEGATIVE
Leukocytes,Ua: NEGATIVE
Nitrite: NEGATIVE
Specific Gravity, Urine: 1.02 (ref 1.000–1.030)
Total Protein, Urine: 100 — AB
Urine Glucose: NEGATIVE
Urobilinogen, UA: 0.2 (ref 0.0–1.0)
pH: 7 (ref 5.0–8.0)

## 2023-04-28 LAB — CBC WITH DIFFERENTIAL/PLATELET
Basophils Absolute: 0.1 10*3/uL (ref 0.0–0.1)
Basophils Relative: 0.6 % (ref 0.0–3.0)
Eosinophils Absolute: 0.2 10*3/uL (ref 0.0–0.7)
Eosinophils Relative: 1.8 % (ref 0.0–5.0)
HCT: 44.6 % (ref 39.0–52.0)
Hemoglobin: 14.5 g/dL (ref 13.0–17.0)
Lymphocytes Relative: 24.3 % (ref 12.0–46.0)
Lymphs Abs: 2.2 10*3/uL (ref 0.7–4.0)
MCHC: 32.6 g/dL (ref 30.0–36.0)
MCV: 97.6 fl (ref 78.0–100.0)
Monocytes Absolute: 0.8 10*3/uL (ref 0.1–1.0)
Monocytes Relative: 8.5 % (ref 3.0–12.0)
Neutro Abs: 5.9 10*3/uL (ref 1.4–7.7)
Neutrophils Relative %: 64.8 % (ref 43.0–77.0)
Platelets: 295 10*3/uL (ref 150.0–400.0)
RBC: 4.57 Mil/uL (ref 4.22–5.81)
RDW: 15.5 % (ref 11.5–15.5)
WBC: 9.2 10*3/uL (ref 4.0–10.5)

## 2023-04-28 LAB — HEPATIC FUNCTION PANEL
ALT: 26 U/L (ref 0–53)
AST: 30 U/L (ref 0–37)
Albumin: 4.3 g/dL (ref 3.5–5.2)
Alkaline Phosphatase: 70 U/L (ref 39–117)
Bilirubin, Direct: 0.1 mg/dL (ref 0.0–0.3)
Total Bilirubin: 0.8 mg/dL (ref 0.2–1.2)
Total Protein: 7.5 g/dL (ref 6.0–8.3)

## 2023-04-28 LAB — BASIC METABOLIC PANEL
BUN: 14 mg/dL (ref 6–23)
CO2: 28 mEq/L (ref 19–32)
Calcium: 9.9 mg/dL (ref 8.4–10.5)
Chloride: 101 mEq/L (ref 96–112)
Creatinine, Ser: 1.3 mg/dL (ref 0.40–1.50)
GFR: 49.96 mL/min — ABNORMAL LOW (ref >60.00)
Glucose, Bld: 92 mg/dL (ref 70–99)
Potassium: 3.8 mEq/L (ref 3.5–5.1)
Sodium: 141 mEq/L (ref 135–145)

## 2023-04-28 LAB — RENAL FUNCTION PANEL
Albumin: 4.2 g/dL (ref 3.5–5.2)
BUN: 14 mg/dL (ref 6–23)
CO2: 28 mEq/L (ref 19–32)
Calcium: 9.9 mg/dL (ref 8.4–10.5)
Chloride: 101 mEq/L (ref 96–112)
Creatinine, Ser: 1.31 mg/dL (ref 0.40–1.50)
GFR: 49.5 mL/min — ABNORMAL LOW (ref >60.00)
Glucose, Bld: 91 mg/dL (ref 70–99)
Phosphorus: 2.6 mg/dL (ref 2.3–4.6)
Potassium: 3.8 mEq/L (ref 3.5–5.1)
Sodium: 140 mEq/L (ref 135–145)

## 2023-04-28 LAB — URIC ACID: Uric Acid, Serum: 4.3 mg/dL (ref 4.0–7.8)

## 2023-04-28 LAB — TSH: TSH: 5.78 u[IU]/mL — ABNORMAL HIGH (ref 0.35–5.50)

## 2023-04-28 LAB — MAGNESIUM: Magnesium: 1.8 mg/dL (ref 1.5–2.5)

## 2023-04-28 NOTE — Telephone Encounter (Signed)
Labs requested by Dr Thedore Mins ordered.

## 2023-04-29 ENCOUNTER — Other Ambulatory Visit: Payer: Medicare Other

## 2023-04-29 LAB — PARATHYROID HORMONE, INTACT (NO CA): PTH: 94 pg/mL — ABNORMAL HIGH (ref 16–77)

## 2023-05-01 LAB — URINE CULTURE
MICRO NUMBER:: 15520539
SPECIMEN QUALITY:: ADEQUATE

## 2023-05-01 LAB — PROTEIN / CREATININE RATIO, URINE
Creatinine, Urine: 125 mg/dL (ref 20–320)
Protein/Creat Ratio: 768 mg/g{creat} — ABNORMAL HIGH (ref 25–148)
Protein/Creatinine Ratio: 0.768 mg/mg{creat} — ABNORMAL HIGH (ref 0.025–0.148)
Total Protein, Urine: 96 mg/dL — ABNORMAL HIGH (ref 5–25)

## 2023-05-04 ENCOUNTER — Encounter: Payer: Self-pay | Admitting: Internal Medicine

## 2023-05-04 ENCOUNTER — Ambulatory Visit (INDEPENDENT_AMBULATORY_CARE_PROVIDER_SITE_OTHER): Payer: Medicare Other | Admitting: Internal Medicine

## 2023-05-04 VITALS — BP 120/70 | HR 78 | Temp 98.0°F | Resp 17 | Ht 68.0 in | Wt 273.5 lb

## 2023-05-04 DIAGNOSIS — E7800 Pure hypercholesterolemia, unspecified: Secondary | ICD-10-CM | POA: Diagnosis not present

## 2023-05-04 DIAGNOSIS — R739 Hyperglycemia, unspecified: Secondary | ICD-10-CM

## 2023-05-04 DIAGNOSIS — W19XXXA Unspecified fall, initial encounter: Secondary | ICD-10-CM | POA: Diagnosis not present

## 2023-05-04 DIAGNOSIS — R600 Localized edema: Secondary | ICD-10-CM | POA: Diagnosis not present

## 2023-05-04 DIAGNOSIS — M109 Gout, unspecified: Secondary | ICD-10-CM | POA: Diagnosis not present

## 2023-05-04 DIAGNOSIS — N1831 Chronic kidney disease, stage 3a: Secondary | ICD-10-CM

## 2023-05-04 DIAGNOSIS — Z23 Encounter for immunization: Secondary | ICD-10-CM | POA: Diagnosis not present

## 2023-05-04 DIAGNOSIS — I10 Essential (primary) hypertension: Secondary | ICD-10-CM | POA: Diagnosis not present

## 2023-05-04 DIAGNOSIS — Z8546 Personal history of malignant neoplasm of prostate: Secondary | ICD-10-CM

## 2023-05-04 DIAGNOSIS — K2270 Barrett's esophagus without dysplasia: Secondary | ICD-10-CM

## 2023-05-04 DIAGNOSIS — E039 Hypothyroidism, unspecified: Secondary | ICD-10-CM | POA: Diagnosis not present

## 2023-05-04 MED ORDER — TRIAMCINOLONE ACETONIDE 0.1 % EX CREA: 1.0000 | TOPICAL_CREAM | Freq: Two times a day (BID) | CUTANEOUS | 0 refills | Status: AC

## 2023-05-04 MED ORDER — LEVOTHYROXINE SODIUM 137 MCG PO TABS: 137.0000 ug | ORAL_TABLET | Freq: Every day | ORAL | 1 refills | Status: DC

## 2023-05-04 NOTE — Progress Notes (Signed)
Subjective:    [name], male    DOB: 03-23-1935, 88 y.o.   MRN: [id number]  Patient here for  Chief Complaint  Patient presents with   Medical Management of Chronic Issues    C/O bilateral feet swelling/redness Has improved some since increasing the Torsemide.  Also fell on 04/08/23 & hit right side of back on table. C/O soreness    HPI Here to follow up regarding his blood pressure and cholesterol. He is accompanied by his two daughters.  History obtained from all three of them.  Limited mobility due to pain - back. Saw nephrology 01/05/23 - f/u CKD. Weight up. Increased urinary frequency. Increased lower extremity swelling. Recommended increase torsemide to 10mg  q day. Saw rheumatology 01/10/23 - diagnosed with RA. Had f/u 03/14/23 - on methotrexate (increased to 12.5mg  weekly) and plaquenil.  Continues on allopurinol for gout. Had a fall 04/08/23. Mobility has decreased. Feel - right side - hit a table. No head injury. No LOC.  Since, has had decreased mobility. Legs weaker.  More trouble getting up out of chair, out of bed.  Family very involved.  Needs help with ADLs and therapy to help strengthen his legs, core. Breathing stable.  No increased cough or congestion.  Weight up.  Discussed low sodium diet.     Past Medical History:  Diagnosis Date   Acute postoperative pain 03/21/2018   Anemia    Arthritis    Barrett esophagus    Cancer (HCC)    prostate,skin   Chicken pox    Diverticulitis    Dysrhythmia    GERD (gastroesophageal reflux disease)    Hyperlipidemia    Hypertension    Hypothyroidism    Melanoma (HCC)    Malignant resection   Sleep apnea    Ulcer    Past Surgical History:  Procedure Laterality Date   CARDIAC CATHETERIZATION     Cataract Surgery Right 02/13/14   COLON SURGERY  2006-2008-2011   polyps removed   COLONOSCOPY W/ POLYPECTOMY     COLONOSCOPY WITH PROPOFOL N/A 02/15/2018   Procedure: COLONOSCOPY WITH PROPOFOL;  Surgeon: Scot Jun,  MD;  Location: [facility] ENDOSCOPY;  Service: Endoscopy;  Laterality: N/A;   COLONOSCOPY WITH PROPOFOL N/A 03/24/2020   Procedure: COLONOSCOPY WITH PROPOFOL;  Surgeon: Regis Bill, MD;  Location: [facility] ENDOSCOPY;  Service: Endoscopy;  Laterality: N/A;   cystocopy  2003   CYSTOSCOPY/RETROGRADE/URETEROSCOPY/STONE EXTRACTION WITH BASKET Right 10/28/2020   Procedure: CYSTOSCOPY/RETROGRADE PYELOGRAM/STONE EXTRACTION;  Surgeon: Riki Altes, MD;  Location: [facility] ORS;  Service: Urology;  Laterality: Right;   ESOPHAGOGASTRODUODENOSCOPY (EGD) WITH PROPOFOL N/A 08/04/2016   Procedure: ESOPHAGOGASTRODUODENOSCOPY (EGD) WITH PROPOFOL;  Surgeon: Scot Jun, MD;  Location: [facility] ENDOSCOPY;  Service: Endoscopy;  Laterality: N/A;   ESOPHAGOGASTRODUODENOSCOPY (EGD) WITH PROPOFOL N/A 03/24/2020   Procedure: ESOPHAGOGASTRODUODENOSCOPY (EGD) WITH PROPOFOL;  Surgeon: Regis Bill, MD;  Location: [facility] ENDOSCOPY;  Service: Endoscopy;  Laterality: N/A;   HEMORRHOID SURGERY     PROSTATE SURGERY     sleep study     Family History  Problem Relation Age of Onset   Liver disease Mother    Heart disease Father    Kidney disease Father    Breast cancer Daughter    Social History   Socioeconomic History   Marital status: Widowed    Spouse name: Not on file   Number of children: Not on file   Years of education: Not on file   Highest education level:  12th grade  Occupational History   Not on file  Tobacco Use   Smoking status: Never   Smokeless tobacco: Never  Vaping Use   Vaping status: Never Used  Substance and Sexual Activity   Alcohol use: No    Alcohol/week: 0.0 standard drinks of alcohol   Drug use: No   Sexual activity: Never  Other Topics Concern   Not on file  Social History Narrative   Not on file   Social Determinants of Health   Financial Resource Strain: Low Risk  (11/20/2022)   Overall Financial Resource Strain (CARDIA)    Difficulty of Paying Living Expenses: Not very hard   Food Insecurity: No Food Insecurity (11/20/2022)   Hunger Vital Sign    Worried About Running Out of Food in the Last Year: Never true    Ran Out of Food in the Last Year: Never true  Transportation Needs: No Transportation Needs (11/20/2022)   PRAPARE - Administrator, Civil Service (Medical): No    Lack of Transportation (Non-Medical): No  Physical Activity: Unknown (11/20/2022)   Exercise Vital Sign    Days of Exercise per Week: 0 days    Minutes of Exercise per Session: Not on file  Stress: No Stress Concern Present (11/20/2022)   [employer] of Occupational Health - Occupational Stress Questionnaire    Feeling of Stress : Not at all  Social Connections: Unknown (11/20/2022)   Social Connection and Isolation Panel [NHANES]    Frequency of Communication with Friends and Family: More than three times a week    Frequency of Social Gatherings with Friends and Family: More than three times a week    Attends Religious Services: Patient declined    Database administrator or Organizations: Patient declined    Attends Banker Meetings: Not on file    Marital Status: Widowed     Review of Systems  Constitutional:  Negative for appetite change.       Weight has increased.   HENT:  Negative for congestion and sinus pressure.   Respiratory:  Negative for cough and chest tightness.        Breathing overall stable.   Cardiovascular:  Negative for chest pain and palpitations.  Gastrointestinal:  Negative for diarrhea, nausea and vomiting.  Genitourinary:  Negative for difficulty urinating and dysuria.  Musculoskeletal:        Right side pain as outlined.  Chronic back pain.   Skin:  Negative for color change and rash.  Neurological:  Negative for dizziness and headaches.  Psychiatric/Behavioral:  Negative for agitation and dysphoric mood.        Objective:     BP 120/70   Pulse 78   Temp 98 F (36.7 C) (Oral)   Resp 17   Ht 5\' 8"  (1.727 m)   Wt 273  lb 8 oz (124.1 kg)   SpO2 96%   BMI 41.59 kg/m  Wt Readings from Last 3 Encounters:  05/04/23 273 lb 8 oz (124.1 kg)  01/27/23 265 lb 6.4 oz (120.4 kg)  01/26/23 262 lb (118.8 kg)    Physical Exam Vitals reviewed.  Constitutional:      General: He is not in acute distress.    Appearance: Normal appearance. He is well-developed.  HENT:     Head: Normocephalic and atraumatic.     Right Ear: External ear normal.     Left Ear: External ear normal.  Eyes:     General: No scleral  icterus.       Right eye: No discharge.        Left eye: No discharge.     Conjunctiva/sclera: Conjunctivae normal.  Cardiovascular:     Rate and Rhythm: Normal rate and regular rhythm.  Pulmonary:     Effort: Pulmonary effort is normal. No respiratory distress.     Breath sounds: Normal breath sounds.     Comments: No increased pain with deep inspiration.  Abdominal:     General: Bowel sounds are normal.     Palpations: Abdomen is soft.     Tenderness: There is no abdominal tenderness.  Musculoskeletal:        General: No swelling.     Cervical back: Neck supple. No tenderness.     Comments: Tenderness - right side/ribs.    Lymphadenopathy:     Cervical: No cervical adenopathy.  Skin:    Findings: No erythema or rash.  Neurological:     Mental Status: He is alert.  Psychiatric:        Mood and Affect: Mood normal.        Behavior: Behavior normal.      Outpatient Encounter Medications as of 05/04/2023  Medication Sig   acetaminophen (TYLENOL) 500 MG tablet Take 500 mg by mouth every 6 (six) hours as needed.   allopurinol (ZYLOPRIM) 300 MG tablet Take 300 mg by mouth daily.   amLODipine (NORVASC) 10 MG tablet TAKE 1 TABLET BY MOUTH DAILY   aspirin 81 MG tablet Take 81 mg by mouth daily.   bisacodyl (DULCOLAX) 5 MG EC tablet Take 5 mg by mouth daily as needed for moderate constipation.   fluticasone (FLONASE) 50 MCG/ACT nasal spray TWO PUFFS IN EACH NOSTRIL ONCE A DAY   folic acid (FOLVITE)  1 MG tablet Take 1 mg by mouth daily.   gabapentin (NEURONTIN) 100 MG capsule Take 1 capsule (100 mg total) by mouth 2 (two) times daily.   hydrALAZINE (APRESOLINE) 50 MG tablet Take 1 tablet (50 mg total) by mouth 2 (two) times daily.   hydroxychloroquine (PLAQUENIL) 200 MG tablet Take 200 mg by mouth 2 (two) times daily.   irbesartan (AVAPRO) 300 MG tablet Take 1 tablet (300 mg total) by mouth daily.   levothyroxine (SYNTHROID) 137 MCG tablet Take 1 tablet (137 mcg total) by mouth daily before breakfast.   methotrexate (RHEUMATREX) 2.5 MG tablet Take 12.5 mg by mouth once a week.   naloxone (NARCAN) nasal spray 4 mg/0.1 mL Place 1 spray into the nose as needed for up to 365 doses (for opioid-induced respiratory depresssion). In case of emergency (overdose), spray once into each nostril. If no response within 3 minutes, repeat application and call 911.   omeprazole (PRILOSEC) 20 MG capsule TAKE ONE CAPSULE BY MOUTH TWICE A DAY   senna (SENOKOT) 8.6 MG TABS tablet Take 2 tablets by mouth daily.   torsemide (DEMADEX) 10 MG tablet Take 10 mg by mouth 3 (three) times a week. Take in the afternoon   traMADol (ULTRAM) 50 MG tablet Take 1 tablet (50 mg total) by mouth in the morning, at noon, and at bedtime. Each refill must last 30 days.   triamcinolone cream (KENALOG) 0.1 % Apply 1 Application topically 2 (two) times daily.   valACYclovir (VALTREX) 1000 MG tablet TAKE 1 TABLET BY MOUTH DAILY   VITAMIN D, CHOLECALCIFEROL, PO Take 250 mcg by mouth once a week.   [DISCONTINUED] levothyroxine (SYNTHROID) 125 MCG tablet Take 1 tablet (125 mcg total) by  mouth daily.   [DISCONTINUED] methylPREDNISolone (MEDROL DOSEPAK) 4 MG TBPK tablet Medrol dosepak 6 day taper.  Take as directed.   No facility-administered encounter medications on file as of 05/04/2023.     Lab Results  Component Value Date   WBC 9.2 04/28/2023   HGB 14.5 04/28/2023   HCT 44.6 04/28/2023   PLT 295.0 04/28/2023   GLUCOSE 91  04/28/2023   CHOL 172 04/28/2023   TRIG 259.0 (H) 04/28/2023   HDL 52.70 04/28/2023   LDLDIRECT 89.0 01/24/2023   LDLCALC 67 04/28/2023   ALT 26 04/28/2023   AST 30 04/28/2023   NA 140 04/28/2023   K 3.8 04/28/2023   CL 101 04/28/2023   CREATININE 1.31 04/28/2023   BUN 14 04/28/2023   CO2 28 04/28/2023   TSH 5.78 (H) 04/28/2023   PSA 0.00 (L) 06/16/2020   HGBA1C 5.7 01/24/2023    DG PAIN CLINIC C-ARM 1-60 MIN NO REPORT  Result Date: 05/13/2022 Fluoro was used, but no Radiologist interpretation will be provided. Please refer to "NOTES" tab for provider progress note.      Assessment & Plan:  Hypothyroidism, unspecified type Assessment & Plan: On thyroid replacement.  Follow tsh.   Orders: -     TSH; Future  Primary hypertension Assessment & Plan: Taking hydralazine, avapro and amlodipine.  Taking hydralazine to 50mg  bid.  Follow pressures. Follow metabolic panel.    Stage 3a chronic kidney disease (HCC) Assessment & Plan: Continues on avapro.  Avoid antiinflammatories.  Follow metabolic panel. Renal ultrasound - no acute abnormality. Had f/u with nephrology 01/05/23.  Weight up.  Increased urinary frequency.  Increased lower extremity swelling.  Recommended increase torsemide to 10mg  q day. Did not tolerate flomax. Discussed increased weight today.  Check metabolic panel.  Discussed the need to weigh daily.  Can increase to bid for the next 3 days.  Monitor sodium intake. Elevate legs. PT - for increased ambulation.   Orders: -     Amb Referral to Palliative Care  Need for influenza vaccination -     Flu Vaccine Trivalent High Dose (Fluad)  Barrett's esophagus without dysplasia Assessment & Plan: No upper symptoms.  On prilosec.  Stable. Has been followed by GI.    Gout, unspecified cause, unspecified chronicity, unspecified site Assessment & Plan: Has a history  of gout.  Has been on allopurinol.  Rheumatology recently increased allopurinal to 300mg  q day.     History of prostate cancer Assessment & Plan: S/p prostatectomy.  Followed by Dr Lonna Cobb.     Hypercholesterolemia Assessment & Plan: Low cholesterol diet and exercise. Crestor.  Follow lipid panel and liver function tests.    Hyperglycemia Assessment & Plan: Low carb diet and exercise.  Follow met b and a1c.    Lower extremity edema Assessment & Plan: Swelling as outlined. On torsemide daily now.  Weigh daily. Increase torsemide to bid for 3 days. Follow metabolic panel.  Avoid increased sodium.    Orders: -     Amb Referral to Palliative Care  Severe obesity (BMI >= 40) (HCC) Assessment & Plan: Diet and exercise.  Continue blood pressure medication for hypertension.  Continue crestor - hypercholesterolemia.    Fall, initial encounter Assessment & Plan: Recent fall as outlined.  Right side pain.  Good breath sounds.  No pain with deep inspiration. Discussed further evaluation.  Will monitor.  Hold on further scanning/xray.  Follow.  With increased weakness.  Needing increased assistance with ADLs, etc.  Known CKD, increased  lower extremity swelling and chronic pain, will place order for palliative care evaluation.    Orders: -     Amb Referral to Palliative Care  Other orders -     Levothyroxine Sodium; Take 1 tablet (137 mcg total) by mouth daily before breakfast.  Dispense: 90 tablet; Refill: 1 -     Triamcinolone Acetonide; Apply 1 Application topically 2 (two) times daily.  Dispense: 30 g; Refill: 0  I spent 45 minutes with the patient and his daughters. Time spent discussing her current concerns and symptoms.  Specifically time spent discussing his recent fall, pain, lower extremity swelling and plans for palliative care consult. Time also spent discussing further w/up, evaluation and treatment.     Dale Freestone, MD

## 2023-05-08 ENCOUNTER — Encounter: Payer: Self-pay | Admitting: Internal Medicine

## 2023-05-08 DIAGNOSIS — W19XXXA Unspecified fall, initial encounter: Secondary | ICD-10-CM | POA: Insufficient documentation

## 2023-05-08 NOTE — Assessment & Plan Note (Signed)
S/p prostatectomy.  Followed by Dr Stoioff.   

## 2023-05-08 NOTE — Assessment & Plan Note (Signed)
Has a history  of gout.  Has been on allopurinol.  Rheumatology recently increased allopurinal to 300mg  q day.

## 2023-05-08 NOTE — Assessment & Plan Note (Signed)
Low cholesterol diet and exercise.  Crestor.  Follow lipid panel and liver function tests.   

## 2023-05-08 NOTE — Assessment & Plan Note (Signed)
Diet and exercise.  Continue blood pressure medication for hypertension.  Continue crestor - hypercholesterolemia.

## 2023-05-08 NOTE — Assessment & Plan Note (Signed)
Taking hydralazine, avapro and amlodipine.  Taking hydralazine to 50mg bid.  Follow pressures. Follow metabolic panel.  

## 2023-05-08 NOTE — Assessment & Plan Note (Signed)
No upper symptoms.  On prilosec.  Stable. Has been followed by GI.  

## 2023-05-08 NOTE — Assessment & Plan Note (Signed)
Swelling as outlined. On torsemide daily now.  Weigh daily. Increase torsemide to bid for 3 days. Follow metabolic panel.  Avoid increased sodium.

## 2023-05-08 NOTE — Assessment & Plan Note (Signed)
Recent fall as outlined.  Right side pain.  Good breath sounds.  No pain with deep inspiration. Discussed further evaluation.  Will monitor.  Hold on further scanning/xray.  Follow.  With increased weakness.  Needing increased assistance with ADLs, etc.  Known CKD, increased lower extremity swelling and chronic pain, will place order for palliative care evaluation.

## 2023-05-08 NOTE — Assessment & Plan Note (Signed)
Low carb diet and exercise.  Follow met b and a1c.  

## 2023-05-08 NOTE — Assessment & Plan Note (Signed)
On thyroid replacement.  Follow tsh.  

## 2023-05-08 NOTE — Assessment & Plan Note (Signed)
Continues on avapro.  Avoid antiinflammatories.  Follow metabolic panel. Renal ultrasound - no acute abnormality. Had f/u with nephrology 01/05/23.  Weight up.  Increased urinary frequency.  Increased lower extremity swelling.  Recommended increase torsemide to 10mg  q day. Did not tolerate flomax. Discussed increased weight today.  Check metabolic panel.  Discussed the need to weigh daily.  Can increase to bid for the next 3 days.  Monitor sodium intake. Elevate legs. PT - for increased ambulation.

## 2023-05-09 DIAGNOSIS — I10 Essential (primary) hypertension: Secondary | ICD-10-CM | POA: Diagnosis not present

## 2023-05-09 DIAGNOSIS — R801 Persistent proteinuria, unspecified: Secondary | ICD-10-CM | POA: Diagnosis not present

## 2023-05-09 DIAGNOSIS — I129 Hypertensive chronic kidney disease with stage 1 through stage 4 chronic kidney disease, or unspecified chronic kidney disease: Secondary | ICD-10-CM | POA: Diagnosis not present

## 2023-05-09 DIAGNOSIS — R600 Localized edema: Secondary | ICD-10-CM | POA: Diagnosis not present

## 2023-05-09 DIAGNOSIS — Z87442 Personal history of urinary calculi: Secondary | ICD-10-CM | POA: Diagnosis not present

## 2023-05-09 DIAGNOSIS — N1831 Chronic kidney disease, stage 3a: Secondary | ICD-10-CM | POA: Diagnosis not present

## 2023-05-13 ENCOUNTER — Telehealth: Payer: Self-pay

## 2023-05-13 DIAGNOSIS — R29898 Other symptoms and signs involving the musculoskeletal system: Secondary | ICD-10-CM

## 2023-05-13 DIAGNOSIS — N1831 Chronic kidney disease, stage 3a: Secondary | ICD-10-CM

## 2023-05-13 NOTE — Telephone Encounter (Signed)
Physical therapy referral pended for you per our discussion

## 2023-05-13 NOTE — Telephone Encounter (Signed)
Received call from palliative care. Request PT referral. Order placed for referral.

## 2023-05-16 DIAGNOSIS — M0579 Rheumatoid arthritis with rheumatoid factor of multiple sites without organ or systems involvement: Secondary | ICD-10-CM | POA: Diagnosis not present

## 2023-05-16 DIAGNOSIS — Z79899 Other long term (current) drug therapy: Secondary | ICD-10-CM | POA: Diagnosis not present

## 2023-05-16 DIAGNOSIS — M1A39X0 Chronic gout due to renal impairment, multiple sites, without tophus (tophi): Secondary | ICD-10-CM | POA: Diagnosis not present

## 2023-05-30 DIAGNOSIS — I129 Hypertensive chronic kidney disease with stage 1 through stage 4 chronic kidney disease, or unspecified chronic kidney disease: Secondary | ICD-10-CM | POA: Diagnosis not present

## 2023-05-30 DIAGNOSIS — R600 Localized edema: Secondary | ICD-10-CM | POA: Diagnosis not present

## 2023-05-30 DIAGNOSIS — N1831 Chronic kidney disease, stage 3a: Secondary | ICD-10-CM | POA: Diagnosis not present

## 2023-05-30 DIAGNOSIS — R801 Persistent proteinuria, unspecified: Secondary | ICD-10-CM | POA: Diagnosis not present

## 2023-06-01 DIAGNOSIS — N1831 Chronic kidney disease, stage 3a: Secondary | ICD-10-CM | POA: Diagnosis not present

## 2023-06-01 DIAGNOSIS — Z87442 Personal history of urinary calculi: Secondary | ICD-10-CM | POA: Diagnosis not present

## 2023-06-01 DIAGNOSIS — R809 Proteinuria, unspecified: Secondary | ICD-10-CM | POA: Diagnosis not present

## 2023-06-01 DIAGNOSIS — R600 Localized edema: Secondary | ICD-10-CM | POA: Diagnosis not present

## 2023-06-01 DIAGNOSIS — I129 Hypertensive chronic kidney disease with stage 1 through stage 4 chronic kidney disease, or unspecified chronic kidney disease: Secondary | ICD-10-CM | POA: Diagnosis not present

## 2023-06-03 ENCOUNTER — Other Ambulatory Visit: Payer: Self-pay | Admitting: Internal Medicine

## 2023-06-10 ENCOUNTER — Telehealth: Payer: Self-pay | Admitting: Internal Medicine

## 2023-06-10 NOTE — Telephone Encounter (Signed)
Can increase gabapentin to 100mg  2 q hs and continue am as he is doing.  Monitor for any side effects.

## 2023-06-10 NOTE — Telephone Encounter (Signed)
Hector [name] from [facility]ector [name] called wanting to know if the provider can make some adjustments to Hector [name] due to the pain in his back.  Hector is still taking the tramadol

## 2023-06-10 NOTE — Telephone Encounter (Signed)
Verbal given to Julie.

## 2023-06-10 NOTE — Telephone Encounter (Signed)
Raynelle Fanning from Palliative Care to state she is returning call from Rita Ohara, LPN.  I transferred call to [place].

## 2023-06-10 NOTE — Telephone Encounter (Signed)
Hector [name] from palliative care says that patient is having more issues with his back. He is using Tramadol in the AM every day and the evening if needed. She was wondering if we could increase his gabapentin. He is currently taking gabapentin 100 mg BID.

## 2023-06-10 NOTE — Telephone Encounter (Signed)
LM for Hector [name]

## 2023-06-13 ENCOUNTER — Telehealth: Payer: Self-pay | Admitting: Internal Medicine

## 2023-06-13 DIAGNOSIS — E039 Hypothyroidism, unspecified: Secondary | ICD-10-CM

## 2023-06-13 NOTE — Telephone Encounter (Signed)
FYI- advised daughter ok to have TSH drawn at Dr Thea Gist office in December. Will send order. This will save him a trip and prevent him from having to be stuck twice within a week.

## 2023-06-13 NOTE — Telephone Encounter (Signed)
Patient's daughter just called and wants to know she said her dad has two lab appointments coming up. She wanted to know if lab orders can be sent to Dr. Thedore Mins office. She said that would be more better for them. His number is [phone]. If you have questions.

## 2023-06-13 NOTE — Telephone Encounter (Signed)
Ok

## 2023-06-15 NOTE — Telephone Encounter (Signed)
Order faxed to [facility]

## 2023-06-27 ENCOUNTER — Telehealth: Payer: Self-pay | Admitting: Internal Medicine

## 2023-06-27 ENCOUNTER — Ambulatory Visit (INDEPENDENT_AMBULATORY_CARE_PROVIDER_SITE_OTHER): Payer: Medicare Other | Admitting: *Deleted

## 2023-06-27 VITALS — BP 125/71 | HR 51 | Ht 68.0 in | Wt 259.0 lb

## 2023-06-27 DIAGNOSIS — Z0000 Encounter for general adult medical examination without abnormal findings: Secondary | ICD-10-CM | POA: Diagnosis not present

## 2023-06-27 NOTE — Telephone Encounter (Signed)
Received cc'd chart from his annual wellness exam and daughter apparently reported that his blood pressure was lower yesterday (106 systolic).  Was feeling a little dizzy.  Feeling better during visit.  Please call and confirm he is doing ok. Confirm how blood pressures are doing.

## 2023-06-27 NOTE — Patient Instructions (Addendum)
Hector [name] , Thank you for taking time to come for your Medicare Wellness Visit. I appreciate your ongoing commitment to your health goals. Please review the following plan we discussed and let me know if I can assist you in the future.   Referrals/Orders/Follow-Ups/Clinician Recommendations: Consider updating your shingles vaccines  This is a list of the screening recommended for you and due dates:  Health Maintenance  Topic Date Due   Zoster (Shingles) Vaccine (1 of 2) 06/07/1954   COVID-19 Vaccine (4 - 2023-24 season) 04/03/2023   DTaP/Tdap/Td vaccine (2 - Tdap) 04/17/2024   Medicare Annual Wellness Visit  06/26/2024   Pneumonia Vaccine  Completed   Flu Shot  Completed   HPV Vaccine  Aged Out    Advanced directives: (In Chart) A copy of your advanced directives are scanned into your chart should your provider ever need it.  Next Medicare Annual Wellness Visit scheduled for next year: Yes 07/02/24 @ 12:45   Managing Pain Without Opioids  Opioids are strong medicines used to treat moderate to severe pain. For some people, especially those who have long-term (chronic) pain, opioids may not be the best choice for pain management due to: Side effects like nausea, constipation, and sleepiness. The risk of addiction (opioid use disorder). The longer you take opioids, the greater your risk of addiction. Pain that lasts for more than 3 months is called chronic pain. Managing chronic pain usually requires more than one approach and is often provided by a team of health care providers working together (multidisciplinary approach). Pain management may be done at a pain management center or pain clinic. How to manage pain without the use of opioids Use non-opioid medicines Non-opioid medicines for pain may include: Over-the-counter or prescription non-steroidal anti-inflammatory drugs (NSAIDs). These may be the first medicines used for pain. They work well for muscle and bone pain, and they reduce  swelling. Acetaminophen. This over-the-counter medicine may work well for milder pain but not swelling. Antidepressants. These may be used to treat chronic pain. A certain type of antidepressant (tricyclics) is often used. These medicines are given in lower doses for pain than when used for depression. Anticonvulsants. These are usually used to treat seizures but may also reduce nerve (neuropathic) pain. Muscle relaxants. These relieve pain caused by sudden muscle tightening (spasms). You may also use a pain medicine that is applied to the skin as a patch, cream, or gel (topical analgesic), such as a numbing medicine. These may cause fewer side effects than medicines taken by mouth. Do certain therapies as directed Some therapies can help with pain management. They include: Physical therapy. You will do exercises to gain strength and flexibility. A physical therapist may teach you exercises to move and stretch parts of your body that are weak, stiff, or painful. You can learn these exercises at physical therapy visits and practice them at home. Physical therapy may also involve: Massage. Heat wraps or applying heat or cold to affected areas. Electrical signals that interrupt pain signals (transcutaneous electrical nerve stimulation, TENS). Weak lasers that reduce pain and swelling (low-level laser therapy). Signals from your body that help you learn to regulate pain (biofeedback). Occupational therapy. This helps you to learn ways to function at home and work with less pain. Recreational therapy. This involves trying new activities or hobbies, such as a physical activity or drawing. Mental health therapy, including: Cognitive behavioral therapy (CBT). This helps you learn coping skills for dealing with pain. Acceptance and commitment therapy (ACT) to change  the way you think and react to pain. Relaxation therapies, including muscle relaxation exercises and mindfulness-based stress reduction. Pain  management counseling. This may be individual, family, or group counseling.  Receive medical treatments Medical treatments for pain management include: Nerve block injections. These may include a pain blocker and anti-inflammatory medicines. You may have injections: Near the spine to relieve chronic back or neck pain. Into joints to relieve back or joint pain. Into nerve areas that supply a painful area to relieve body pain. Into muscles (trigger point injections) to relieve some painful muscle conditions. A medical device placed near your spine to help block pain signals and relieve nerve pain or chronic back pain (spinal cord stimulation device). Acupuncture. Follow these instructions at home Medicines Take over-the-counter and prescription medicines only as told by your health care provider. If you are taking pain medicine, ask your health care providers about possible side effects to watch out for. Do not drive or use heavy machinery while taking prescription opioid pain medicine. Lifestyle  Do not use drugs or alcohol to reduce pain. If you drink alcohol, limit how much you have to: 0-1 drink a day for women who are not pregnant. 0-2 drinks a day for men. Know how much alcohol is in a drink. In the [place] one drink equals one 12 oz bottle of beer (355 mL), one 5 oz glass of wine (148 mL), or one 1 oz glass of hard liquor (44 mL). Do not use any products that contain nicotine or tobacco. These products include cigarettes, chewing tobacco, and vaping devices, such as e-cigarettes. If you need help quitting, ask your health care provider. Eat a healthy diet and maintain a healthy weight. Poor diet and excess weight may make pain worse. Eat foods that are high in fiber. These include fresh fruits and vegetables, whole grains, and beans. Limit foods that are high in fat and processed sugars, such as fried and sweet foods. Exercise regularly. Exercise lowers stress and may help relieve  pain. Ask your health care provider what activities and exercises are safe for you. If your health care provider approves, join an exercise class that combines movement and stress reduction. Examples include yoga and tai chi. Get enough sleep. Lack of sleep may make pain worse. Lower stress as much as possible. Practice stress reduction techniques as told by your therapist. General instructions Work with all your pain management providers to find the treatments that work best for you. You are an important member of your pain management team. There are many things you can do to reduce pain on your own. Consider joining an online or in-person support group for people who have chronic pain. Keep all follow-up visits. This is important. Where to find more information You can find more information about managing pain without opioids from: American Academy of Pain Medicine: painmed.org Institute for Chronic Pain: instituteforchronicpain.org American Chronic Pain Association: theacpa.org Contact a health care provider if: You have side effects from pain medicine. Your pain gets worse or does not get better with treatments or home therapy. You are struggling with anxiety or depression. Summary Many types of pain can be managed without opioids. Chronic pain may respond better to pain management without opioids. Pain is best managed when you and a team of health care providers work together. Pain management without opioids may include non-opioid medicines, medical treatments, physical therapy, mental health therapy, and lifestyle changes. Tell your health care providers if your pain gets worse or is not being managed  well enough. This information is not intended to replace advice given to you by your health care provider. Make sure you discuss any questions you have with your health care provider. Document Revised: 10/29/2020 Document Reviewed: 10/29/2020 Elsevier Patient Education  2024 [employer].

## 2023-06-27 NOTE — Progress Notes (Signed)
Subjective:   Hector [name] is a 88 y.o. male who presents for Medicare Annual/Subsequent preventive examination.  Visit Complete: Virtual I connected with  THORSTEN BENNINGHOFF on 06/27/23 by a audio enabled telemedicine application and verified that I am speaking with the correct person using two identifiers.  Patient Location: Home  Provider Location: Office/Clinic  I discussed the limitations of evaluation and management by telemedicine. The patient expressed understanding and agreed to proceed.  Vital Signs: Because this visit was a virtual/telehealth visit, some criteria may be missing or patient reported. Any vitals not documented were not able to be obtained and vitals that have been documented are patient reported.   Cardiac Risk Factors include: advanced age (>25men, >26 women);dyslipidemia;male gender;hypertension;obesity (BMI >30kg/m2)     Objective:    Today's Vitals   06/27/23 1330  BP: 125/71  Pulse: (!) 51  Weight: 259 lb (117.5 kg)  Height: 5\' 8"  (1.727 m)   Body mass index is 39.38 kg/m.     06/27/2023    1:53 PM 01/26/2023   12:50 PM 08/04/2022   12:52 PM 06/03/2022    2:12 PM 05/27/2022    2:15 PM 05/04/2022    1:37 PM 04/20/2022   11:01 AM  Advanced Directives  Does Patient Have a Medical Advance Directive? Yes Yes Yes Yes No No No  Type of Estate agent of [place];Living will   Healthcare Power of [place];Living will     Does patient want to make changes to medical advance directive? No - Patient declined   No - Patient declined     Copy of Healthcare Power of Attorney in Chart? Yes - validated most recent copy scanned in chart (See row information)   Yes - validated most recent copy scanned in chart (See row information)     Would patient like information on creating a medical advance directive?     No - Patient declined  No - Patient declined    Current Medications (verified) Outpatient Encounter Medications as of 06/27/2023   Medication Sig   acetaminophen (TYLENOL) 500 MG tablet Take 500 mg by mouth every 6 (six) hours as needed.   allopurinol (ZYLOPRIM) 300 MG tablet Take 300 mg by mouth daily.   aspirin 81 MG tablet Take 81 mg by mouth daily.   bisacodyl (DULCOLAX) 5 MG EC tablet Take 5 mg by mouth daily as needed for moderate constipation.   fluticasone (FLONASE) 50 MCG/ACT nasal spray TWO PUFFS IN EACH NOSTRIL ONCE A DAY   folic acid (FOLVITE) 1 MG tablet Take 1 mg by mouth daily.   gabapentin (NEURONTIN) 100 MG capsule Take 1 capsule (100 mg total) by mouth 2 (two) times daily.   hydrALAZINE (APRESOLINE) 50 MG tablet Take 1 tablet (50 mg total) by mouth 2 (two) times daily.   hydroxychloroquine (PLAQUENIL) 200 MG tablet Take 200 mg by mouth 2 (two) times daily.   irbesartan (AVAPRO) 300 MG tablet Take 1 tablet (300 mg total) by mouth daily.   levothyroxine (SYNTHROID) 137 MCG tablet Take 1 tablet (137 mcg total) by mouth daily before breakfast.   methotrexate (RHEUMATREX) 2.5 MG tablet Take 12.5 mg by mouth once a week.   metolazone (ZAROXOLYN) 2.5 MG tablet Take 2.5 mg by mouth once a week.   naloxone (NARCAN) nasal spray 4 mg/0.1 mL Place 1 spray into the nose as needed for up to 365 doses (for opioid-induced respiratory depresssion). In case of emergency (overdose), spray once into each nostril. If  no response within 3 minutes, repeat application and call 911.   omeprazole (PRILOSEC) 20 MG capsule TAKE 1 CAPSULE BY MOUTH TWICE DAILY   senna (SENOKOT) 8.6 MG TABS tablet Take 2 tablets by mouth daily.   torsemide (DEMADEX) 10 MG tablet Take 10 mg by mouth 3 (three) times a week. Take in the afternoon   traMADol (ULTRAM) 50 MG tablet Take 1 tablet (50 mg total) by mouth in the morning, at noon, and at bedtime. Each refill must last 30 days.   triamcinolone cream (KENALOG) 0.1 % Apply 1 Application topically 2 (two) times daily.   valACYclovir (VALTREX) 1000 MG tablet TAKE 1 TABLET BY MOUTH DAILY   VITAMIN D,  CHOLECALCIFEROL, PO Take 250 mcg by mouth once a week.   amLODipine (NORVASC) 10 MG tablet TAKE 1 TABLET BY MOUTH DAILY   No facility-administered encounter medications on file as of 06/27/2023.    Allergies (verified) Tape   History: Past Medical History:  Diagnosis Date   Acute postoperative pain 03/21/2018   Anemia    Arthritis    Barrett esophagus    Cancer (HCC)    prostate,skin   Chicken pox    Diverticulitis    Dysrhythmia    GERD (gastroesophageal reflux disease)    Hyperlipidemia    Hypertension    Hypothyroidism    Melanoma (HCC)    Malignant resection   Sleep apnea    Ulcer    Past Surgical History:  Procedure Laterality Date   CARDIAC CATHETERIZATION     Cataract Surgery Right 02/13/14   COLON SURGERY  2006-2008-2011   polyps removed   COLONOSCOPY W/ POLYPECTOMY     COLONOSCOPY WITH PROPOFOL N/A 02/15/2018   Procedure: COLONOSCOPY WITH PROPOFOL;  Surgeon: Scot Jun, MD;  Location: [facility] ENDOSCOPY;  Service: Endoscopy;  Laterality: N/A;   COLONOSCOPY WITH PROPOFOL N/A 03/24/2020   Procedure: COLONOSCOPY WITH PROPOFOL;  Surgeon: Regis Bill, MD;  Location: [facility] ENDOSCOPY;  Service: Endoscopy;  Laterality: N/A;   cystocopy  2003   CYSTOSCOPY/RETROGRADE/URETEROSCOPY/STONE EXTRACTION WITH BASKET Right 10/28/2020   Procedure: CYSTOSCOPY/RETROGRADE PYELOGRAM/STONE EXTRACTION;  Surgeon: Riki Altes, MD;  Location: [facility] ORS;  Service: Urology;  Laterality: Right;   ESOPHAGOGASTRODUODENOSCOPY (EGD) WITH PROPOFOL N/A 08/04/2016   Procedure: ESOPHAGOGASTRODUODENOSCOPY (EGD) WITH PROPOFOL;  Surgeon: Scot Jun, MD;  Location[facility]COPY;  Service: Endoscopy;  Laterality: N/A;   ESOPHAGOGASTRODUODENOSCOPY (EGD) WITH PROPOFOL N/A 03/24/2020   Procedure: ESOPHAGOGASTRODUODENOSCOPY (EGD) WITH PROPOFOL;  Surgeon: Regis Bill, MD;  Location: [facility] ENDOSCOPY;  Service: Endoscopy;  Laterality: N/A;   HEMORRHOID SURGERY     PROSTATE SURGERY      sleep study     Family History  Problem Relation Age of Onset   Liver disease Mother    Heart disease Father    Kidney disease Father    Breast cancer Daughter    Social History   Socioeconomic History   Marital status: Widowed    Spouse name: Not on file   Number of children: Not on file   Years of education: Not on file   Highest education level: 12th grade  Occupational History   Not on file  Tobacco Use   Smoking status: Never   Smokeless tobacco: Never  Vaping Use   Vaping status: Never Used  Substance and Sexual Activity   Alcohol use: No    Alcohol/week: 0.0 standard drinks of alcohol   Drug use: No   Sexual activity: Never  Other Topics Concern  Not on file  Social History Narrative   Not on file   Social Determinants of Health   Financial Resource Strain: Low Risk  (06/27/2023)   Overall Financial Resource Strain (CARDIA)    Difficulty of Paying Living Expenses: Not hard at all  Food Insecurity: No Food Insecurity (06/27/2023)   Hunger Vital Sign    Worried About Running Out of Food in the Last Year: Never true    Ran Out of Food in the Last Year: Never true  Transportation Needs: No Transportation Needs (06/27/2023)   PRAPARE - Administrator, Civil Service (Medical): No    Lack of Transportation (Non-Medical): No  Physical Activity: Inactive (06/27/2023)   Exercise Vital Sign    Days of Exercise per Week: 0 days    Minutes of Exercise per Session: 0 min  Stress: No Stress Concern Present (06/27/2023)   [employer] of Occupational Health - Occupational Stress Questionnaire    Feeling of Stress : Not at all  Social Connections: Socially Isolated (06/27/2023)   Social Connection and Isolation Panel [NHANES]    Frequency of Communication with Friends and Family: More than three times a week    Frequency of Social Gatherings with Friends and Family: More than three times a week    Attends Religious Services: Never    Doctor, general practice or Organizations: No    Attends Banker Meetings: Never    Marital Status: Widowed    Tobacco Counseling Counseling given: Not Answered   Clinical Intake:  Pre-visit preparation completed: Yes  Pain : No/denies pain     BMI - recorded: 39.38 Nutritional Status: BMI > 30  Obese Nutritional Risks: None Diabetes: No  How often do you need to have someone help you when you read instructions, pamphlets, or other written materials from your doctor or pharmacy?: 1 - Never  Interpreter Needed?: No  Information entered by :: R. Ninfa Giannelli LPN   Activities of Daily Living    06/27/2023   12:11 PM  In your present state of health, do you have any difficulty performing the following activities:  Hearing? 0  Vision? 0  Comment glasses  Difficulty concentrating or making decisions? 0  Walking or climbing stairs? 1  Dressing or bathing? 1  Doing errands, shopping? 1  Preparing Food and eating ? Y  Using the Toilet? N  In the past six months, have you accidently leaked urine? Y  Do you have problems with loss of bowel control? N  Managing your Medications? N  Managing your Finances? N  Housekeeping or managing your Housekeeping? Y    Patient Care Team: Dale Hackett, MD as PCP - General (Internal Medicine) Debbe Odea, MD as PCP - Cardiology (Cardiology) Dale Celina, MD (Internal Medicine) Mosetta Pigeon, MD (Nephrology)  Indicate any recent Medical Services you may have received from other than [facility] providers in the past year (date may be approximate).     Assessment:   This is a routine wellness examination for Sylas.  Hearing/Vision screen Hearing Screening - Comments:: No issues Vision Screening - Comments:: glasses   Goals Addressed             This Visit's Progress    Patient Stated       Try to become more active and get out more       Depression Screen    06/27/2023    1:46 PM 05/04/2023   12:39 PM 01/27/2023    1:08  PM 01/26/2023   12:50 PM 08/04/2022   12:52 PM 06/03/2022    2:11 PM 05/27/2022    2:15 PM  PHQ 2/9 Scores  PHQ - 2 Score 0 0 0 0 0 0 0  PHQ- 9 Score 3 4         Fall Risk    06/27/2023   12:11 PM 05/04/2023   12:39 PM 01/27/2023    1:08 PM 01/26/2023   12:50 PM 08/04/2022   12:52 PM  Fall Risk   Falls in the past year? 1 1 0 0 0  Number falls in past yr: 1 0 0    Injury with Fall? 1 0 0    Risk for fall due to : History of fall(s);Impaired balance/gait Impaired mobility No Fall Risks    Follow up Falls evaluation completed;Falls prevention discussed  Falls evaluation completed      MEDICARE RISK AT HOME: Medicare Risk at Home Any stairs in or around the home?: Yes If so, are there any without handrails?: No Home free of loose throw rugs in walkways, pet beds, electrical cords, etc?: Yes Adequate lighting in your home to reduce risk of falls?: Yes Life alert?: Yes Use of a cane, walker or w/c?: Yes Grab bars in the bathroom?: Yes Shower chair or bench in shower?: Yes Elevated toilet seat or a handicapped toilet?: No   Cognitive Function:    05/24/2016    2:07 PM 05/13/2015    9:26 AM  MMSE - Mini Mental State Exam  Orientation to time 5 5  Orientation to Place 5 5  Registration 3 3  Attention/ Calculation 5 5  Recall 3 3  Language- name 2 objects 2 2  Language- repeat 1 1  Language- follow 3 step command 3 3  Language- read & follow direction 1 1  Write a sentence 1 1  Copy design 1 1  Total score 30 30        06/27/2023    1:53 PM 06/03/2022    2:16 PM 05/29/2020    9:53 AM 05/29/2019    9:46 AM 05/25/2018    8:55 AM  6CIT Screen  What Year? 0 points 0 points 0 points 0 points 0 points  What month? 0 points 0 points 0 points 0 points 0 points  What time? 0 points 0 points 0 points 0 points 0 points  Count back from 20 0 points 0 points 0 points 0 points 0 points  Months in reverse 0 points 0 points  0 points 0 points  Repeat phrase 0 points 0 points  0  points 0 points  Total Score 0 points 0 points  0 points 0 points    Immunizations Immunization History  Administered Date(s) Administered   Fluad Quad(high Dose 65+) 04/25/2019, 04/14/2020, 04/23/2021, 06/01/2022   Fluad Trivalent(High Dose 65+) 05/04/2023   Influenza Split 05/03/2012, 05/01/2014   Influenza Whole 05/09/2017   Influenza, High Dose Seasonal PF 04/13/2016, 03/30/2018   Influenza,inj,Quad PF,6+ Mos 05/13/2015   PFIZER(Purple Top)SARS-COV-2 Vaccination 08/16/2019, 09/06/2019, 03/31/2020   Pneumococcal Conjugate-13 04/17/2014   Pneumococcal Polysaccharide-23 05/24/2016   Td 04/17/2014   Zoster, Live 05/02/2010    TDAP status: Up to date  Flu Vaccine status: Up to date  Pneumococcal vaccine status: Up to date  Covid-19 vaccine status: Information provided on how to obtain vaccines.   Qualifies for Shingles Vaccine? Yes   Zostavax completed Yes   Shingrix Completed?: No.    Education has been provided regarding  the importance of this vaccine. Patient has been advised to call insurance company to determine out of pocket expense if they have not yet received this vaccine. Advised may also receive vaccine at local pharmacy or Health Dept. Verbalized acceptance and understanding.  Screening Tests Health Maintenance  Topic Date Due   Zoster Vaccines- Shingrix (1 of 2) 06/07/1954   COVID-19 Vaccine (4 - 2023-24 season) 04/03/2023   Medicare Annual Wellness (AWV)  06/04/2023   DTaP/Tdap/Td (2 - Tdap) 04/17/2024   Pneumonia Vaccine 7+ Years old  Completed   INFLUENZA VACCINE  Completed   HPV VACCINES  Aged Out    Health Maintenance  Health Maintenance Due  Topic Date Due   Zoster Vaccines- Shingrix (1 of 2) 06/07/1954   COVID-19 Vaccine (4 - 2023-24 season) 04/03/2023   Medicare Annual Wellness (AWV)  06/04/2023    Colorectal cancer screening: No longer required.   Lung Cancer Screening: (Low Dose CT Chest recommended if Age 47-80 years, 20 pack-year  currently smoking OR have quit w/in 15years.) does not qualify.    Additional Screening:  Hepatitis C Screening: does not qualify; Completed NA age  Vision Screening: Recommended annual ophthalmology exams for early detection of glaucoma and other disorders of the eye. Is the patient up to date with their annual eye exam?  Yes  Who is the provider or what is the name of the office in which the patient attends annual eye exams? [facility]  If pt is not established with a provider, would they like to be referred to a provider to establish care? No .   Dental Screening: Recommended annual dental exams for proper oral hygiene   Community Resource Referral / Chronic Care Management: CRR required this visit?  No   CCM required this visit?  No     Plan:     I have personally reviewed and noted the following in the patient's chart:   Medical and social history Use of alcohol, tobacco or illicit drugs  Current medications and supplements including opioid prescriptions. Patient is currently taking opioid prescriptions. Information provided to patient regarding non-opioid alternatives. Patient advised to discuss non-opioid treatment plan with their provider. Functional ability and status Nutritional status Physical activity Advanced directives List of other physicians Hospitalizations, surgeries, and ER visits in previous 12 months Vitals Screenings to include cognitive, depression, and falls Referrals and appointments  In addition, I have reviewed and discussed with patient certain preventive protocols, quality metrics, and best practice recommendations. A written personalized care plan for preventive services as well as general preventive health recommendations were provided to patient.     Sydell Axon, LPN   [id number]   After Visit Summary: (MyChart) Due to this being a telephonic visit, the after visit summary with patients personalized plan was offered to patient via MyChart    Nurse Notes: Patient's daughter helped with the telephone visit.

## 2023-06-28 NOTE — Telephone Encounter (Signed)
Called and spoke with daughter. She was not concerned. Dr Thedore Mins has been adjusting his meds and following him. He has started metolazone, stopped amlodipine and has a f/u with him on Monday. Overall pt doing ok. Swelling in his feet and legs are much better.

## 2023-06-29 ENCOUNTER — Other Ambulatory Visit: Payer: Medicare Other

## 2023-07-06 DIAGNOSIS — N1831 Chronic kidney disease, stage 3a: Secondary | ICD-10-CM | POA: Diagnosis not present

## 2023-07-06 DIAGNOSIS — I10 Essential (primary) hypertension: Secondary | ICD-10-CM | POA: Diagnosis not present

## 2023-07-06 DIAGNOSIS — R809 Proteinuria, unspecified: Secondary | ICD-10-CM | POA: Diagnosis not present

## 2023-07-06 DIAGNOSIS — I129 Hypertensive chronic kidney disease with stage 1 through stage 4 chronic kidney disease, or unspecified chronic kidney disease: Secondary | ICD-10-CM | POA: Diagnosis not present

## 2023-07-06 DIAGNOSIS — R600 Localized edema: Secondary | ICD-10-CM | POA: Diagnosis not present

## 2023-07-11 DIAGNOSIS — N1832 Chronic kidney disease, stage 3b: Secondary | ICD-10-CM | POA: Diagnosis not present

## 2023-07-11 DIAGNOSIS — R600 Localized edema: Secondary | ICD-10-CM | POA: Diagnosis not present

## 2023-07-11 DIAGNOSIS — I129 Hypertensive chronic kidney disease with stage 1 through stage 4 chronic kidney disease, or unspecified chronic kidney disease: Secondary | ICD-10-CM | POA: Diagnosis not present

## 2023-07-11 DIAGNOSIS — I10 Essential (primary) hypertension: Secondary | ICD-10-CM | POA: Diagnosis not present

## 2023-07-11 DIAGNOSIS — Z87442 Personal history of urinary calculi: Secondary | ICD-10-CM | POA: Diagnosis not present

## 2023-07-12 ENCOUNTER — Other Ambulatory Visit: Payer: Self-pay | Admitting: Internal Medicine

## 2023-07-13 ENCOUNTER — Telehealth: Payer: Self-pay

## 2023-07-13 ENCOUNTER — Other Ambulatory Visit: Payer: Self-pay

## 2023-07-13 MED ORDER — GABAPENTIN 100 MG PO CAPS: ORAL_CAPSULE | ORAL | 1 refills | Status: DC

## 2023-07-13 NOTE — Telephone Encounter (Signed)
Raynelle Fanning called from [facility] to state Dr. Dale Whetstone increased patient's gabapentin last month.  Raynelle Fanning states patient's pharmacy, [facility], needs a new prescription sent over. Raynelle Fanning states pharmacy is doing bubble packs for patient.  Raynelle Fanning states patient has only been getting the 100 MG dose, since that is what he has in his bubble packs.  Raynelle Fanning states patient is supposed to be getting 100 MG in the morning and 200 MG in the evening.

## 2023-07-13 NOTE — Telephone Encounter (Signed)
I do not mind adjusting rx, but just need to clarify a few things.  It appears if he is getting bubble packs, he has not been on 100mg  in am and 100mg  (2 at bedtime). Has he ever taken this amount?  When was he instructed to change dose of medication?  Does he feel he needs and if so, confirm he is tolerating the current dose.

## 2023-07-13 NOTE — Telephone Encounter (Signed)
Clarified with daughter, see phone note from 11/8. Verbal given to [place]. New Rx sent in to [facility]

## 2023-07-14 DIAGNOSIS — Z7960 Long term (current) use of unspecified immunomodulators and immunosuppressants: Secondary | ICD-10-CM | POA: Diagnosis not present

## 2023-07-14 DIAGNOSIS — M0579 Rheumatoid arthritis with rheumatoid factor of multiple sites without organ or systems involvement: Secondary | ICD-10-CM | POA: Diagnosis not present

## 2023-07-14 DIAGNOSIS — Z79899 Other long term (current) drug therapy: Secondary | ICD-10-CM | POA: Diagnosis not present

## 2023-07-14 DIAGNOSIS — M1A39X0 Chronic gout due to renal impairment, multiple sites, without tophus (tophi): Secondary | ICD-10-CM | POA: Diagnosis not present

## 2023-07-19 NOTE — Progress Notes (Unsigned)
PROVIDER NOTE: Information contained herein reflects review and annotations entered in association with encounter. Interpretation of such information and data should be left to medically-trained personnel. Information provided to patient can be located elsewhere in the medical record under "Patient Instructions". Document created using STT-dictation technology, any transcriptional errors that may result from process are unintentional.    Patient: Hector [name]  Service Category: E/M  Provider: Oswaldo Done, MD  DOB: [birth year]  DOS: 07/20/2023  Referring Provider: Dale [place], MD  MRN: [id number]  Specialty: Interventional Pain Management  PCP: Dale Page, MD  Type: Established Patient  Setting: Ambulatory outpatient    Location: Office  Delivery: Face-to-face     HPI  Hector [name], a 88 y.o. year old male, is here today because of his No primary diagnosis found.. Hector [name]'s primary complain today is No chief complaint on file.  Pertinent problems: Hector [name] has History of prostate cancer; Shoulder pain, left; Chronic hip pain (Left); Chronic low back pain (1ry area of Pain) (Bilateral) (L>R) w/ sciatica (Left); Chronic pain syndrome; Polyarthritis, unspecified (Secondary Area of Pain); L5-S1 bilateral pars defect with spondylolisthesis; Lumbosacral Grade 1 Anterolisthesis of L5 over S1; Lumbar facet arthropathy (Bilateral); Lumbar foraminal stenosis (L5-S1) (Bilateral) (L>R); Chronic Lumbar radiculitis (L5) (Left); Chronic musculoskeletal pain; Lumbar facet syndrome (Bilateral) (L>R); Chronic lower extremity pain (2ry area of Pain) (Left); Chronic lower extremity radicular pain (L5) (Left); Lumbar facet osteoarthritis (Bilateral); Osteoarthritis of lumbar spine; Lumbar spondylosis; Spondylosis without myelopathy or radiculopathy, lumbosacral region; Chronic right shoulder pain; DDD (degenerative disc disease), lumbosacral; Gout; Chronic low back pain (Bilateral) w/o sciatica; Neurogenic  pain; Abnormal MRI, lumbar spine (08/22/2021); Numbness and tingling of lower extremity (Right); Lumbosacral radiculopathy at S1 (Right); Chronic lower extremity pain (Right); Chronic low back pain (Bilateral) w/ sciatica (Right); and Right sided weakness on their pertinent problem list. Pain Assessment: Severity of   is reported as a  /10. Location:    / . Onset:  . Quality:  . Timing:  . Modifying factor(s):  Marland Kitchen Vitals:  vitals were not taken for this visit.  BMI: Estimated body mass index is 39.38 kg/m as calculated from the following:   Height as of 06/27/23: 5\' 8"  (1.727 m).   Weight as of 06/27/23: 259 lb (117.5 kg). Last encounter: 01/26/2023. Last procedure: Visit date not found.  Reason for encounter: medication management. ***  Discussed the use of AI scribe software for clinical note transcription with the patient, who gave verbal consent to proceed.  History of Present Illness         RTCB: 01/29/2024   Pharmacotherapy Assessment  Analgesic:  Tramadol 50 mg, 1 tab PO q 8 hrs (150 mg/day of tramadol) MME/day: 15 mg/day.   Monitoring: [place] PMP: PDMP reviewed during this encounter.       Pharmacotherapy: No side-effects or adverse reactions reported. Compliance: No problems identified. Effectiveness: Clinically acceptable.  No notes on file  No results found for: "CBDTHCR" No results found for: "D8THCCBX" No results found for: "D9THCCBX"  UDS:  Summary  Date Value Ref Range Status  01/26/2023 Note  Final    Comment:    ==================================================================== [employer] Select 13 (MW) ==================================================================== Test                             Result       Flag       Units  Drug Present and Declared for Prescription Verification  Tramadol                       >5882        EXPECTED   ng/mg creat   O-Desmethyltramadol            582          EXPECTED   ng/mg creat   N-Desmethyltramadol             >5882        EXPECTED   ng/mg creat    Source of tramadol is a prescription medication. O-desmethyltramadol    and N-desmethyltramadol are expected metabolites of tramadol.  ==================================================================== Test                      Result    Flag   Units      Ref Range   Creatinine              85               mg/dL      >=46 ==================================================================== Declared Medications:  The flagging and interpretation on this report are based on the  following declared medications.  Unexpected results may arise from  inaccuracies in the declared medications.   **Note: The testing scope of this panel includes these medications:   Tramadol (Ultram)   **Note: The testing scope of this panel does not include the  following reported medications:   Acetaminophen (Tylenol)  Allopurinol  Amlodipine  Aspirin  Bisacodyl  Fluticasone (Flonase)  Gabapentin (Neurontin)  Hydralazine  Hydroxychloroquine  Irbesartan (Avapro)  Levothyroxine  Methylprednisolone (Medrol)  Naloxone (Narcan)  Omeprazole  Sennosides  Torsemide  Valacyclovir (Valtrex) ==================================================================== For clinical consultation, please call [phone]. ====================================================================       ROS  Constitutional: Denies any fever or chills Gastrointestinal: No reported hemesis, hematochezia, vomiting, or acute GI distress Musculoskeletal: Denies any acute onset joint swelling, redness, loss of ROM, or weakness Neurological: No reported episodes of acute onset apraxia, aphasia, dysarthria, agnosia, amnesia, paralysis, loss of coordination, or loss of consciousness  Medication Review  Cholecalciferol, acetaminophen, allopurinol, amLODipine, aspirin, bisacodyl, fluticasone, folic acid, gabapentin, hydrALAZINE, hydroxychloroquine, irbesartan, levothyroxine, methotrexate,  metolazone, naloxone, omeprazole, senna, torsemide, traMADol, triamcinolone cream, and valACYclovir  History Review  Allergy: Hector [name] is allergic to tape. Drug: Hector [name]  reports no history of drug use. Alcohol:  reports no history of alcohol use. Tobacco:  reports that he has never smoked. He has never used smokeless tobacco. Social: Hector [name]  reports that he has never smoked. He has never used smokeless tobacco. He reports that he does not drink alcohol and does not use drugs. Medical:  has a past medical history of Acute postoperative pain (03/21/2018), Anemia, Arthritis, Barrett esophagus, Cancer (HCC), Chicken pox, Diverticulitis, Dysrhythmia, GERD (gastroesophageal reflux disease), Hyperlipidemia, Hypertension, Hypothyroidism, Melanoma (HCC), Sleep apnea, and Ulcer. Surgical: Hector [name]  has a past surgical history that includes Prostate surgery; cystocopy (2003); Hemorrhoid surgery; Cardiac catheterization; sleep study; Colon surgery (2006-2008-2011); Cataract Surgery (Right, 02/13/14); Esophagogastroduodenoscopy (egd) with propofol (N/A, 08/04/2016); Colonoscopy w/ polypectomy; Colonoscopy with propofol (N/A, 02/15/2018); Colonoscopy with propofol (N/A, 03/24/2020); Esophagogastroduodenoscopy (egd) with propofol (N/A, 03/24/2020); and Cystoscopy/retrograde/ureteroscopy/stone extraction with basket (Right, 10/28/2020). Family: family history includes Breast cancer in his daughter; Heart disease in his father; Kidney disease in his father; Liver disease in his mother.  Laboratory Chemistry Profile   Renal Lab Results  Component Value Date   BUN  14 04/28/2023   CREATININE 1.31 04/28/2023   LABCREA 125 04/28/2023   BCR 10 06/13/2017   GFR 49.50 (L) 04/28/2023   GFRAA >60 01/23/2018   GFRNONAA 38 (L) 10/18/2020    Hepatic Lab Results  Component Value Date   AST 30 04/28/2023   ALT 26 04/28/2023   ALBUMIN 4.2 04/28/2023   ALKPHOS 70 04/28/2023   LIPASE 27 10/18/2020    Electrolytes Lab  Results  Component Value Date   NA 140 04/28/2023   K 3.8 04/28/2023   CL 101 04/28/2023   CALCIUM 9.9 04/28/2023   MG 1.8 04/28/2023   PHOS 2.6 04/28/2023    Bone Lab Results  Component Value Date   VD25OH 33.78 04/06/2022   25OHVITD1 20 (L) 06/13/2017   25OHVITD2 <1.0 06/13/2017   25OHVITD3 20 06/13/2017    Inflammation (CRP: Acute Phase) (ESR: Chronic Phase) Lab Results  Component Value Date   CRP 2.8 06/13/2017   ESRSEDRATE 13 06/13/2017         Note: Above Lab results reviewed.  Recent Imaging Review  DG PAIN CLINIC C-ARM 1-60 MIN NO REPORT Fluoro was used, but no Radiologist interpretation will be provided.  Please refer to "NOTES" tab for provider progress note. Note: Reviewed        Physical Exam  General appearance: Well nourished, well developed, and well hydrated. In no apparent acute distress Mental status: Alert, oriented x 3 (person, place, & time)       Respiratory: No evidence of acute respiratory distress Eyes: PERLA Vitals: There were no vitals taken for this visit. BMI: Estimated body mass index is 39.38 kg/m as calculated from the following:   Height as of 06/27/23: 5\' 8"  (1.727 m).   Weight as of 06/27/23: 259 lb (117.5 kg). Ideal: Patient weight not recorded  Assessment   Diagnosis Status  No diagnosis found. Controlled Controlled Controlled   Updated Problems: No problems updated.  Plan of Care  Problem-specific:  Assessment and Plan            Hector [name] has a current medication list which includes the following long-term medication(s): amlodipine, fluticasone, gabapentin, hydralazine, irbesartan, levothyroxine, metolazone, omeprazole, torsemide, and tramadol.  Pharmacotherapy (Medications Ordered): No orders of the defined types were placed in this encounter.  Orders:  No orders of the defined types were placed in this encounter.  Follow-up plan:   No follow-ups on file.      Interventional Therapies  Risk   Complexity Considerations:   Estimated body mass index is 39.53 kg/m as calculated from the following:   Height as of this encounter: 5\' 8"  (1.727 m).   Weight as of this encounter: 260 lb (117.9 kg). POOR Candidate for any more RFA due to movement and body habitus.   Planned  Pending:   Therapeutic right caudal ESI #1 (04/20/2022)    Under consideration:   Diagnostic/therapeutic bilateral L5 TFESI #1    Completed:   Diagnostic midline-left caudal ESI x3 (08/27/2021) (100/100/90/90)  Palliative right lumbar facet MBB (L2-S1) x3 (08/07/2020) (100/100/100/100)  Palliative left lumbar facet MBB (L2-S1) x2 (10/27/2017) (100/100/95/75-100)  Palliative right lumbar facet RFA (L2-S1) x1 (04/18/2018) (100/100/100/100)  Palliative left lumbar facet RFA (L2-S1) x1 (03/21/2018) (75/75/80/80)    Completed by other providers:   None at this time   Therapeutic  Palliative (PRN) options:   Diagnostic midline caudal ESI #3  Palliative right lumbar facet block #4  Palliative left lumbar facet block #3  Palliative right lumbar  facet RFA #2  Palliative left lumbar facet RFA #2    Pharmacotherapy  Nonopioids transferred 05/07/2020: Calcium and vitamin D3        Recent Visits No visits were found meeting these conditions. Showing recent visits within past 90 days and meeting all other requirements Future Appointments Date Type Provider Dept  07/20/23 Appointment Delano Metz, MD [facility]  Showing future appointments within next 90 days and meeting all other requirements  I discussed the assessment and treatment plan with the patient. The patient was provided an opportunity to ask questions and all were answered. The patient agreed with the plan and demonstrated an understanding of the instructions.  Patient advised to call back or seek an in-person evaluation if the symptoms or condition worsens.  Duration of encounter: *** minutes.  Total time on encounter, as per AMA  guidelines included both the face-to-face and non-face-to-face time personally spent by the physician and/or other qualified health care professional(s) on the day of the encounter (includes time in activities that require the physician or other qualified health care professional and does not include time in activities normally performed by clinical staff). Physician's time may include the following activities when performed: Preparing to see the patient (e.g., pre-charting review of records, searching for previously ordered imaging, lab work, and nerve conduction tests) Review of prior analgesic pharmacotherapies. Reviewing PMP Interpreting ordered tests (e.g., lab work, imaging, nerve conduction tests) Performing post-procedure evaluations, including interpretation of diagnostic procedures Obtaining and/or reviewing separately obtained history Performing a medically appropriate examination and/or evaluation Counseling and educating the patient/family/caregiver Ordering medications, tests, or procedures Referring and communicating with other health care professionals (when not separately reported) Documenting clinical information in the electronic or other health record Independently interpreting results (not separately reported) and communicating results to the patient/ family/caregiver Care coordination (not separately reported)  Note by: Oswaldo Done, MD Date: 07/20/2023; Time: 9:22 AM

## 2023-07-19 NOTE — Patient Instructions (Signed)

## 2023-07-20 ENCOUNTER — Ambulatory Visit: Payer: Medicare Other | Attending: Pain Medicine | Admitting: Pain Medicine

## 2023-07-20 ENCOUNTER — Encounter: Payer: Self-pay | Admitting: Pain Medicine

## 2023-07-20 DIAGNOSIS — Z79891 Long term (current) use of opiate analgesic: Secondary | ICD-10-CM | POA: Insufficient documentation

## 2023-07-20 DIAGNOSIS — G894 Chronic pain syndrome: Secondary | ICD-10-CM | POA: Diagnosis not present

## 2023-07-20 DIAGNOSIS — M4310 Spondylolisthesis, site unspecified: Secondary | ICD-10-CM | POA: Diagnosis not present

## 2023-07-20 DIAGNOSIS — M5442 Lumbago with sciatica, left side: Secondary | ICD-10-CM | POA: Insufficient documentation

## 2023-07-20 DIAGNOSIS — Z8546 Personal history of malignant neoplasm of prostate: Secondary | ICD-10-CM | POA: Diagnosis present

## 2023-07-20 DIAGNOSIS — M51372 Other intervertebral disc degeneration, lumbosacral region with discogenic back pain and lower extremity pain: Secondary | ICD-10-CM | POA: Insufficient documentation

## 2023-07-20 DIAGNOSIS — M79605 Pain in left leg: Secondary | ICD-10-CM | POA: Diagnosis not present

## 2023-07-20 DIAGNOSIS — F1120 Opioid dependence, uncomplicated: Secondary | ICD-10-CM | POA: Diagnosis present

## 2023-07-20 DIAGNOSIS — M47816 Spondylosis without myelopathy or radiculopathy, lumbar region: Secondary | ICD-10-CM | POA: Insufficient documentation

## 2023-07-20 DIAGNOSIS — M4317 Spondylolisthesis, lumbosacral region: Secondary | ICD-10-CM

## 2023-07-20 DIAGNOSIS — M51379 Other intervertebral disc degeneration, lumbosacral region without mention of lumbar back pain or lower extremity pain: Secondary | ICD-10-CM

## 2023-07-20 DIAGNOSIS — G8929 Other chronic pain: Secondary | ICD-10-CM | POA: Insufficient documentation

## 2023-07-20 DIAGNOSIS — Z79899 Other long term (current) drug therapy: Secondary | ICD-10-CM | POA: Diagnosis not present

## 2023-07-20 MED ORDER — TRAMADOL HCL 50 MG PO TABS: 50.0000 mg | ORAL_TABLET | Freq: Three times a day (TID) | ORAL | 5 refills | Status: DC

## 2023-07-20 NOTE — Progress Notes (Signed)
Nursing Pain Medication Assessment:  Safety precautions to be maintained throughout the outpatient stay will include: orient to surroundings, keep bed in low position, maintain call bell within reach at all times, provide assistance with transfer out of bed and ambulation.  Medication Inspection Compliance: Pill count conducted under aseptic conditions, in front of the patient. Neither the pills nor the bottle was removed from the patient's sight at any time. Once count was completed pills were immediately returned to the patient in their original bottle.  Medication: Tramadol (Ultram) Pill/Patch Count:  65 of 90 pills remain Pill/Patch Appearance: Markings consistent with prescribed medication Bottle Appearance: Standard pharmacy container. Clearly labeled. Filled Date: 15 / 6 / 2024 Last Medication intake:  Today

## 2023-07-23 ENCOUNTER — Encounter: Payer: Self-pay | Admitting: Internal Medicine

## 2023-07-25 NOTE — Telephone Encounter (Signed)
Sending to doc of day  Due to c/o discolor in toes, weakness and dizziness

## 2023-07-25 NOTE — Telephone Encounter (Signed)
Please call Lupita Leash and confirm he is doing ok. Need to confirm dizziness better, etc.  Confirm how his toes look, pain feet,etc?

## 2023-07-26 NOTE — Telephone Encounter (Signed)
Spoke with daughter. Pt is some better today. BP has improved. Daughter stated that they are going to call palliative care after the holidays and see if the nurse can come out. Offered to schedule them an appointment. They declined. They did not feel like it was necessary because he is doing ok. No dizziness today. They will take him to be evaluated if anything acute. Advised them to keep an eye on his feet and if any changes- needs to have them looked at.

## 2023-08-17 ENCOUNTER — Encounter: Payer: Self-pay | Admitting: Internal Medicine

## 2023-08-17 NOTE — Telephone Encounter (Signed)
 Yes, ok to do labs at his appt here

## 2023-09-05 ENCOUNTER — Ambulatory Visit: Payer: Medicare Other | Admitting: Internal Medicine

## 2023-09-05 VITALS — BP 112/68 | HR 79 | Temp 98.0°F | Resp 16 | Ht 68.0 in | Wt 256.0 lb

## 2023-09-05 DIAGNOSIS — N1831 Chronic kidney disease, stage 3a: Secondary | ICD-10-CM

## 2023-09-05 DIAGNOSIS — R600 Localized edema: Secondary | ICD-10-CM | POA: Diagnosis not present

## 2023-09-05 DIAGNOSIS — E039 Hypothyroidism, unspecified: Secondary | ICD-10-CM

## 2023-09-05 DIAGNOSIS — R29898 Other symptoms and signs involving the musculoskeletal system: Secondary | ICD-10-CM

## 2023-09-05 DIAGNOSIS — I10 Essential (primary) hypertension: Secondary | ICD-10-CM

## 2023-09-05 DIAGNOSIS — E7800 Pure hypercholesterolemia, unspecified: Secondary | ICD-10-CM

## 2023-09-05 DIAGNOSIS — M109 Gout, unspecified: Secondary | ICD-10-CM

## 2023-09-05 DIAGNOSIS — K2270 Barrett's esophagus without dysplasia: Secondary | ICD-10-CM

## 2023-09-05 DIAGNOSIS — R739 Hyperglycemia, unspecified: Secondary | ICD-10-CM

## 2023-09-05 NOTE — Progress Notes (Unsigned)
Subjective:    [name], male    DOB: 01-01-1935, 89 y.o.   MRN: [id number]  Patient here for  Chief Complaint  Patient presents with   Medical Management of Chronic Issues    HPI Here to follow up regarding his blood pressure and cholesterol. He is accompanied by his two daughters.  History obtained from all three of them. Had covid 07/29/23. Described as "mild symptoms".  Had f/u with pain clinic 07/20/23. Continues on tramadol prn. Had f/u with rheumatology 07/14/23 - continues on methotrexate, plaquenil and allopurinol. Continues on prednisone 5mg  q day. Had f/u with nephrology 07/11/23 - stable. Continues daily torsemide with extra 10mg  Monday, Wednesday and Friday prn.   Discuss - toes and get renal labs.   Past Medical History:  Diagnosis Date   Acute postoperative pain 03/21/2018   Anemia    Arthritis    Barrett esophagus    Cancer (HCC)    prostate,skin   Chicken pox    Diverticulitis    Dysrhythmia    GERD (gastroesophageal reflux disease)    Hyperlipidemia    Hypertension    Hypothyroidism    Kidney disease    Melanoma (HCC)    Malignant resection   Rheumatoid arteritis (HCC)    Sleep apnea    Ulcer    Past Surgical History:  Procedure Laterality Date   CARDIAC CATHETERIZATION     Cataract Surgery Right 02/13/14   COLON SURGERY  2006-2008-2011   polyps removed   COLONOSCOPY W/ POLYPECTOMY     COLONOSCOPY WITH PROPOFOL N/A 02/15/2018   Procedure: COLONOSCOPY WITH PROPOFOL;  Surgeon: Scot Jun, MD;  Location: [facility] ENDOSCOPY;  Service: Endoscopy;  Laterality: N/A;   COLONOSCOPY WITH PROPOFOL N/A 03/24/2020   Procedure: COLONOSCOPY WITH PROPOFOL;  Surgeon: Regis Bill, MD;  Location: [facility] ENDOSCOPY;  Service: Endoscopy;  Laterality: N/A;   cystocopy  2003   CYSTOSCOPY/RETROGRADE/URETEROSCOPY/STONE EXTRACTION WITH BASKET Right 10/28/2020   Procedure: CYSTOSCOPY/RETROGRADE PYELOGRAM/STONE EXTRACTION;  Surgeon: Riki Altes, MD;   Location: [facility] ORS;  Service: Urology;  Laterality: Right;   ESOPHAGOGASTRODUODENOSCOPY (EGD) WITH PROPOFOL N/A 08/04/2016   Procedure: ESOPHAGOGASTRODUODENOSCOPY (EGD) WITH PROPOFOL;  Surgeon: Scot Jun, MD;  Location: [facility] ENDOSCOPY;  Service: Endoscopy;  Laterality: N/A;   ESOPHAGOGASTRODUODENOSCOPY (EGD) WITH PROPOFOL N/A 03/24/2020   Procedure: ESOPHAGOGASTRODUODENOSCOPY (EGD) WITH PROPOFOL;  Surgeon: Regis Bill, MD;  Location: [facility] ENDOSCOPY;  Service: Endoscopy;  Laterality: N/A;   HEMORRHOID SURGERY     PROSTATE SURGERY     sleep study     Family History  Problem Relation Age of Onset   Liver disease Mother    Heart disease Father    Kidney disease Father    Breast cancer Daughter    Social History   Socioeconomic History   Marital status: Widowed    Spouse name: Not on file   Number of children: Not on file   Years of education: Not on file   Highest education level: 11th grade  Occupational History   Not on file  Tobacco Use   Smoking status: Never   Smokeless tobacco: Never  Vaping Use   Vaping status: Never Used  Substance and Sexual Activity   Alcohol use: No    Alcohol/week: 0.0 standard drinks of alcohol   Drug use: No   Sexual activity: Never  Other Topics Concern   Not on file  Social History Narrative   Not on file   Social Drivers of  Health   Financial Resource Strain: Low Risk  (09/03/2023)   Overall Financial Resource Strain (CARDIA)    Difficulty of Paying Living Expenses: Not very hard  Food Insecurity: No Food Insecurity (09/03/2023)   Hunger Vital Sign    Worried About Running Out of Food in the Last Year: Never true    Ran Out of Food in the Last Year: Never true  Transportation Needs: No Transportation Needs (09/03/2023)   PRAPARE - Administrator, Civil Service (Medical): No    Lack of Transportation (Non-Medical): No  Physical Activity: Inactive (09/03/2023)   Exercise Vital Sign    Days of Exercise per Week: 0 days     Minutes of Exercise per Session: 0 min  Stress: No Stress Concern Present (09/03/2023)   [employer] of Occupational Health - Occupational Stress Questionnaire    Feeling of Stress : Only a little  Social Connections: Socially Isolated (09/03/2023)   Social Connection and Isolation Panel [NHANES]    Frequency of Communication with Friends and Family: More than three times a week    Frequency of Social Gatherings with Friends and Family: Three times a week    Attends Religious Services: Never    Active Member of Clubs or Organizations: No    Attends Banker Meetings: Never    Marital Status: Widowed     Review of Systems     Objective:     BP 112/68   Pulse 79   Temp 98 F (36.7 C)   Resp 16   Ht 5\' 8"  (1.727 m)   Wt 256 lb (116.1 kg)   SpO2 97%   BMI 38.92 kg/m  Wt Readings from Last 3 Encounters:  09/05/23 256 lb (116.1 kg)  07/20/23 258 lb (117 kg)  06/27/23 259 lb (117.5 kg)    Physical Exam  {Perform Simple Foot Exam  Perform Detailed exam:1} {Insert foot Exam (Optional):30965}   Outpatient Encounter Medications as of 09/05/2023  Medication Sig   allopurinol (ZYLOPRIM) 300 MG tablet Take 300 mg by mouth daily.   folic acid (FOLVITE) 1 MG tablet Take 1 mg by mouth daily.   hydroxychloroquine (PLAQUENIL) 200 MG tablet Take 200 mg by mouth 2 (two) times daily.   irbesartan (AVAPRO) 300 MG tablet Take 1 tablet (300 mg total) by mouth daily.   levothyroxine (SYNTHROID) 137 MCG tablet Take 1 tablet (137 mcg total) by mouth daily before breakfast.   methotrexate (RHEUMATREX) 2.5 MG tablet Take 15 mg by mouth once a week.   metolazone (ZAROXOLYN) 2.5 MG tablet Take 2.5 mg by mouth once a week.   omeprazole (PRILOSEC) 20 MG capsule TAKE 1 CAPSULE BY MOUTH TWICE DAILY   torsemide (DEMADEX) 10 MG tablet Take 10 mg by mouth daily as needed.   acetaminophen (TYLENOL) 500 MG tablet Take 500 mg by mouth every 6 (six) hours as needed.   aspirin 81 MG tablet  Take 81 mg by mouth daily.   bisacodyl (DULCOLAX) 5 MG EC tablet Take 5 mg by mouth daily as needed for moderate constipation.   fluticasone (FLONASE) 50 MCG/ACT nasal spray TWO PUFFS IN EACH NOSTRIL ONCE A DAY   gabapentin (NEURONTIN) 100 MG capsule 1 capsule PO q AM, two capsules PO q PM (Patient taking differently: Take 100 mg by mouth 2 (two) times daily.)   naloxone (NARCAN) nasal spray 4 mg/0.1 mL Place 1 spray into the nose as needed for up to 365 doses (for opioid-induced respiratory depresssion). In  case of emergency (overdose), spray once into each nostril. If no response within 3 minutes, repeat application and call 911.   senna (SENOKOT) 8.6 MG TABS tablet Take 2 tablets by mouth daily.   traMADol (ULTRAM) 50 MG tablet Take 1 tablet (50 mg total) by mouth in the morning, at noon, and at bedtime. Each refill must last 30 days.   triamcinolone cream (KENALOG) 0.1 % Apply 1 Application topically 2 (two) times daily.   valACYclovir (VALTREX) 1000 MG tablet TAKE 1 TABLET BY MOUTH DAILY   VITAMIN D, CHOLECALCIFEROL, PO Take 250 mcg by mouth once a week.   [DISCONTINUED] hydrALAZINE (APRESOLINE) 50 MG tablet Take 1 tablet (50 mg total) by mouth 2 (two) times daily.   No facility-administered encounter medications on file as of 09/05/2023.     Lab Results  Component Value Date   WBC 9.2 04/28/2023   HGB 14.5 04/28/2023   HCT 44.6 04/28/2023   PLT 295.0 04/28/2023   GLUCOSE 91 04/28/2023   CHOL 172 04/28/2023   TRIG 259.0 (H) 04/28/2023   HDL 52.70 04/28/2023   LDLDIRECT 89.0 01/24/2023   LDLCALC 67 04/28/2023   ALT 26 04/28/2023   AST 30 04/28/2023   NA 140 04/28/2023   K 3.8 04/28/2023   CL 101 04/28/2023   CREATININE 1.31 04/28/2023   BUN 14 04/28/2023   CO2 28 04/28/2023   TSH 5.78 (H) 04/28/2023   PSA 0.00 (L) 06/16/2020   HGBA1C 5.7 01/24/2023    DG PAIN CLINIC C-ARM 1-60 MIN NO REPORT Result Date: 05/13/2022 Fluoro was used, but no Radiologist interpretation will be  provided. Please refer to "NOTES" tab for provider progress note.      Assessment & Plan:  Stage 3a chronic kidney disease (HCC) -     CBC with Differential/Platelet -     Parathyroid hormone, intact (no Ca) -     Uric acid -     Urinalysis, Routine w reflex microscopic -     Microalbumin / creatinine urine ratio -     Magnesium  Hypercholesterolemia -     Basic metabolic panel -     Lipid panel -     Hepatic function panel     Dale , MD

## 2023-09-06 LAB — MICROALBUMIN / CREATININE URINE RATIO
Creatinine,U: 179.4 mg/dL
Microalb Creat Ratio: 11.3 mg/g (ref 0.0–30.0)
Microalb, Ur: 20.3 mg/dL — ABNORMAL HIGH (ref 0.0–1.9)

## 2023-09-06 LAB — BASIC METABOLIC PANEL
BUN: 19 mg/dL (ref 6–23)
CO2: 30 meq/L (ref 19–32)
Calcium: 9.6 mg/dL (ref 8.4–10.5)
Chloride: 99 meq/L (ref 96–112)
Creatinine, Ser: 1.64 mg/dL — ABNORMAL HIGH (ref 0.40–1.50)
GFR: 37.71 mL/min — ABNORMAL LOW (ref >60.00)
Glucose, Bld: 93 mg/dL (ref 70–99)
Potassium: 4.2 meq/L (ref 3.5–5.1)
Sodium: 141 meq/L (ref 135–145)

## 2023-09-06 LAB — HEPATIC FUNCTION PANEL
ALT: 35 U/L (ref 0–53)
AST: 38 U/L — ABNORMAL HIGH (ref 0–37)
Albumin: 4 g/dL (ref 3.5–5.2)
Alkaline Phosphatase: 65 U/L (ref 39–117)
Bilirubin, Direct: 0.1 mg/dL (ref 0.0–0.3)
Total Bilirubin: 0.6 mg/dL (ref 0.2–1.2)
Total Protein: 6.6 g/dL (ref 6.0–8.3)

## 2023-09-06 LAB — CBC WITH DIFFERENTIAL/PLATELET
Basophils Absolute: 0.1 10*3/uL (ref 0.0–0.1)
Basophils Relative: 0.8 % (ref 0.0–3.0)
Eosinophils Absolute: 0 10*3/uL (ref 0.0–0.7)
Eosinophils Relative: 0.4 % (ref 0.0–5.0)
HCT: 43.8 % (ref 39.0–52.0)
Hemoglobin: 14.4 g/dL (ref 13.0–17.0)
Lymphocytes Relative: 13.1 % (ref 12.0–46.0)
Lymphs Abs: 1.4 10*3/uL (ref 0.7–4.0)
MCHC: 32.8 g/dL (ref 30.0–36.0)
MCV: 103.3 fL — ABNORMAL HIGH (ref 78.0–100.0)
Monocytes Absolute: 0.7 10*3/uL (ref 0.1–1.0)
Monocytes Relative: 7 % (ref 3.0–12.0)
Neutro Abs: 8.3 10*3/uL — ABNORMAL HIGH (ref 1.4–7.7)
Neutrophils Relative %: 78.7 % — ABNORMAL HIGH (ref 43.0–77.0)
Platelets: 223 10*3/uL (ref 150.0–400.0)
RBC: 4.24 Mil/uL (ref 4.22–5.81)
RDW: 16.3 % — ABNORMAL HIGH (ref 11.5–15.5)
WBC: 10.6 10*3/uL — ABNORMAL HIGH (ref 4.0–10.5)

## 2023-09-06 LAB — URINALYSIS, ROUTINE W REFLEX MICROSCOPIC
Bilirubin Urine: NEGATIVE
Hgb urine dipstick: NEGATIVE
Ketones, ur: NEGATIVE
Leukocytes,Ua: NEGATIVE
Nitrite: NEGATIVE
RBC / HPF: NONE SEEN (ref 0–?)
Specific Gravity, Urine: 1.025 (ref 1.000–1.030)
Total Protein, Urine: 30 — AB
Urine Glucose: NEGATIVE
Urobilinogen, UA: 0.2 (ref 0.0–1.0)
pH: 6 (ref 5.0–8.0)

## 2023-09-06 LAB — LIPID PANEL
Cholesterol: 179 mg/dL (ref 0–200)
HDL: 59.4 mg/dL (ref >39.00)
LDL Cholesterol: 71 mg/dL (ref 0–99)
NonHDL: 119.94
Total CHOL/HDL Ratio: 3
Triglycerides: 243 mg/dL — ABNORMAL HIGH (ref 0.0–149.0)
VLDL: 48.6 mg/dL — ABNORMAL HIGH (ref 0.0–40.0)

## 2023-09-06 LAB — MAGNESIUM: Magnesium: 1.8 mg/dL (ref 1.5–2.5)

## 2023-09-06 LAB — URIC ACID: Uric Acid, Serum: 4.7 mg/dL (ref 4.0–7.8)

## 2023-09-06 LAB — PARATHYROID HORMONE, INTACT (NO CA): PTH: 80 pg/mL — ABNORMAL HIGH (ref 16–77)

## 2023-09-11 ENCOUNTER — Telehealth: Payer: Self-pay | Admitting: Internal Medicine

## 2023-09-11 ENCOUNTER — Encounter: Payer: Self-pay | Admitting: Internal Medicine

## 2023-09-11 NOTE — Assessment & Plan Note (Signed)
 Continues on avapro .  Avoid antiinflammatories.  Follow metabolic panel. Renal ultrasound - no acute abnormality. Discussed the need to weigh daily.  Monitor sodium intake. Elevate legs. PT - for increased ambulation. Check metabolic panel today.

## 2023-09-11 NOTE — Assessment & Plan Note (Signed)
 Continue allopurinol

## 2023-09-11 NOTE — Assessment & Plan Note (Addendum)
 Taking avapro . Off amlodipine . Torsemide and metolazone as directed.  Follow pressures. Follow metabolic panel.

## 2023-09-11 NOTE — Assessment & Plan Note (Signed)
 Swelling improved. Taking torsemide. They have adjusted dose as outlined. Discussed importance of weighing daily. Monitor sodium intake.  Check metabolic panel today.

## 2023-09-11 NOTE — Assessment & Plan Note (Signed)
 On thyroid replacement.  Follow tsh.

## 2023-09-11 NOTE — Assessment & Plan Note (Signed)
 Leg weakness. Difficulty standing and walking.  Agreeable to home health PT/OT.  Has palliative care. See if we can add these services.

## 2023-09-11 NOTE — Assessment & Plan Note (Signed)
 Low carb diet and exercise.  Follow met b and a1c.

## 2023-09-11 NOTE — Assessment & Plan Note (Signed)
No upper symptoms.  On prilosec.  Stable. Has been followed by GI.  

## 2023-09-11 NOTE — Assessment & Plan Note (Signed)
 Low cholesterol diet and exercise.  Crestor.  Follow lipid panel and liver function tests.

## 2023-09-11 NOTE — Telephone Encounter (Signed)
 Per discussion, he has palliative care already established. Need to add PT/OT  - leg weakness.  Let me know if any problems.

## 2023-09-12 NOTE — Telephone Encounter (Signed)
 Please call [facility] home care providers (see previous result note) - and see if they are able to provide PT/OT services for Mr [place].  Can they take a verbal order?

## 2023-09-12 NOTE — Telephone Encounter (Signed)
 Spoke with pt's daughter and she stated that pt is using [facility] for an aide four times a week so they would like to use that agency if they are able to send someone out for the PT and OT.

## 2023-09-12 NOTE — Telephone Encounter (Signed)
 Per Concha Deed, home health referral will need to be placed for PT and OT because they are not equipped with staff to do the home health services right now. Patient can have palliative and home health, services for therapy are just not offered by palliative care at this time.

## 2023-09-12 NOTE — Telephone Encounter (Signed)
 LM for Hector [name] with Palliative care.

## 2023-09-12 NOTE — Telephone Encounter (Signed)
 Please let his daughter know that palliative care is unable to do PT/OT. He can keep palliative care services and have homehealth PT/OT. Do they have a preference of home health agency. I can place order.

## 2023-09-13 NOTE — Telephone Encounter (Signed)
[facility] home care providers call back and I was told that they do not do physical therapy.

## 2023-09-13 NOTE — Telephone Encounter (Signed)
Spoke with pt's daughter, Lupita Leash, to let her know that the [facility] does not offer PT and OT and if she would like for [place] to send a referral to another agency. Lupita Leash stated that she would call around to find an agency and make sure it will be covered by his insurance then she would give [place] a call back to let us know.

## 2023-09-13 NOTE — Telephone Encounter (Signed)
LMTCB with [facility]

## 2023-09-26 DIAGNOSIS — I129 Hypertensive chronic kidney disease with stage 1 through stage 4 chronic kidney disease, or unspecified chronic kidney disease: Secondary | ICD-10-CM | POA: Diagnosis not present

## 2023-09-26 DIAGNOSIS — R600 Localized edema: Secondary | ICD-10-CM | POA: Diagnosis not present

## 2023-09-26 DIAGNOSIS — N1832 Chronic kidney disease, stage 3b: Secondary | ICD-10-CM | POA: Diagnosis not present

## 2023-09-26 DIAGNOSIS — I10 Essential (primary) hypertension: Secondary | ICD-10-CM | POA: Diagnosis not present

## 2023-10-06 ENCOUNTER — Encounter: Payer: Self-pay | Admitting: Internal Medicine

## 2023-10-06 MED ORDER — GABAPENTIN 100 MG PO CAPS: 100.0000 mg | ORAL_CAPSULE | Freq: Two times a day (BID) | ORAL | 3 refills | Status: AC

## 2023-10-08 ENCOUNTER — Other Ambulatory Visit: Payer: Self-pay | Admitting: Internal Medicine

## 2023-10-24 ENCOUNTER — Other Ambulatory Visit: Payer: Self-pay | Admitting: Internal Medicine

## 2023-10-27 DIAGNOSIS — M1A39X0 Chronic gout due to renal impairment, multiple sites, without tophus (tophi): Secondary | ICD-10-CM | POA: Diagnosis not present

## 2023-10-27 DIAGNOSIS — L819 Disorder of pigmentation, unspecified: Secondary | ICD-10-CM | POA: Diagnosis not present

## 2023-10-27 DIAGNOSIS — Z7960 Long term (current) use of unspecified immunomodulators and immunosuppressants: Secondary | ICD-10-CM | POA: Diagnosis not present

## 2023-10-27 DIAGNOSIS — M0579 Rheumatoid arthritis with rheumatoid factor of multiple sites without organ or systems involvement: Secondary | ICD-10-CM | POA: Diagnosis not present

## 2023-10-29 IMAGING — MR MR LUMBAR SPINE W/O CM
5 series · 31 of 48 positions shown · non-contrast
Comparison: 10/19/2016

CLINICAL DATA: Low back pain for over 6 weeks. Status post fall
months ago

EXAM:
MRI LUMBAR SPINE WITHOUT CONTRAST
TECHNIQUE: Multiplanar, multisequence MR imaging of the lumbar spine was
performed. No intravenous contrast was administered.

[Series 5: T2 · sagittal · 4.0mm · 0.81mm/px · 6 of 17 slices shown (1 of 2)]
[im 1/17]
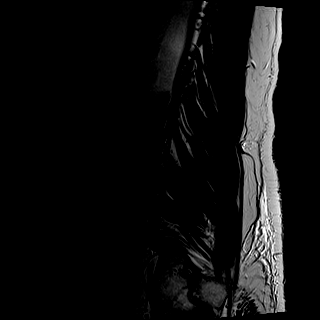
[im 4/17]
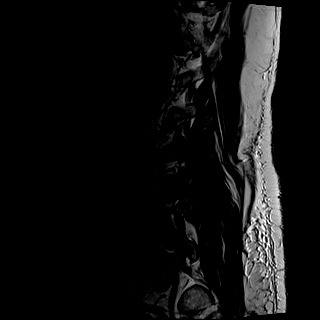
[im 7/17]
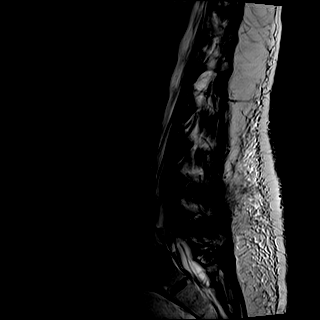
[im 10/17]
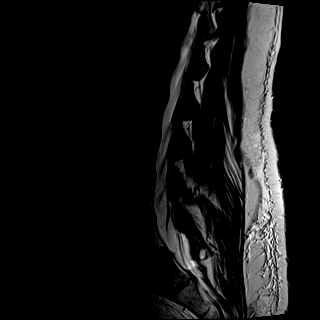
[im 13/17]
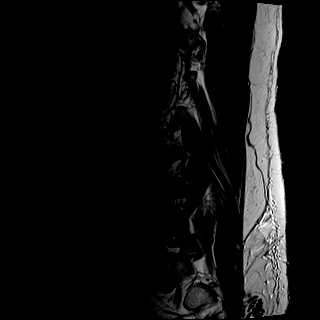
[im 17/17]
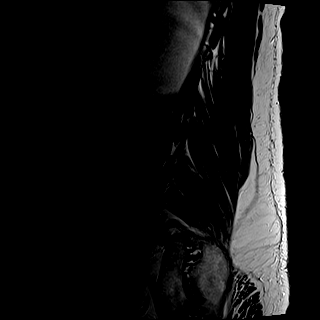

[Series 6: T1 · sagittal · 4.0mm · 0.81mm/px · 7 of 17 slices shown (1 of 2)]
[im 1/17]
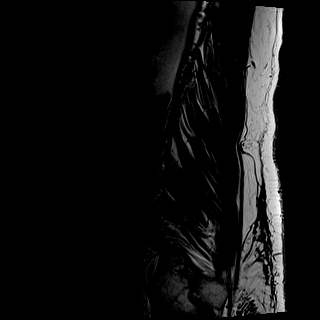
[im 3/17]
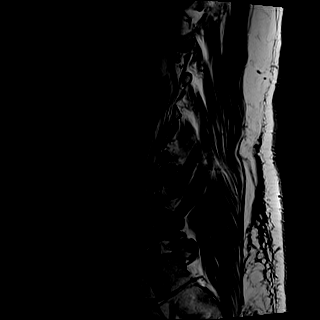
[im 6/17]
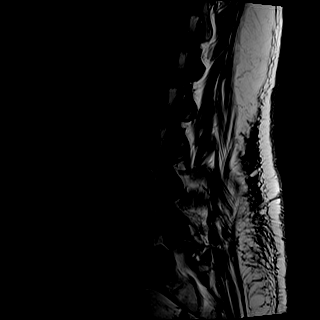
[im 9/17]
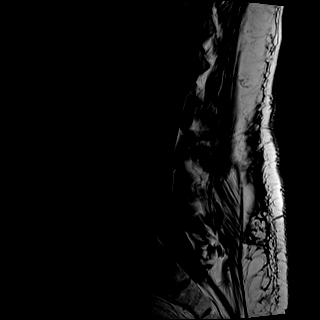
[im 11/17]
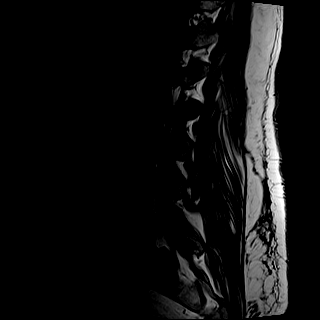
[im 14/17]
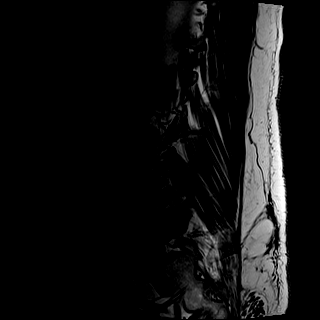
[im 17/17]
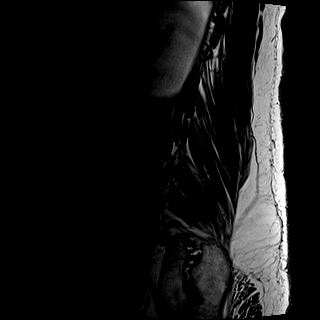

[Series 7: STIR · sagittal · 4.0mm · 0.41mm/px · 2 of 17 slices shown]
[im 1/17]
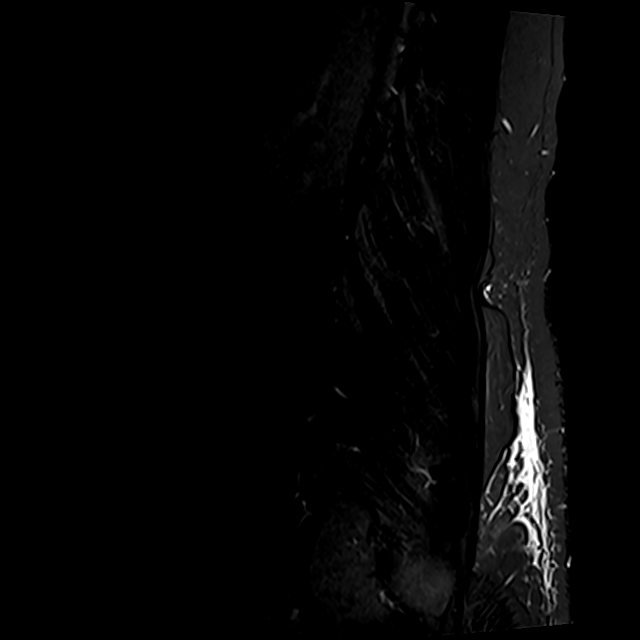
[im 3/17]
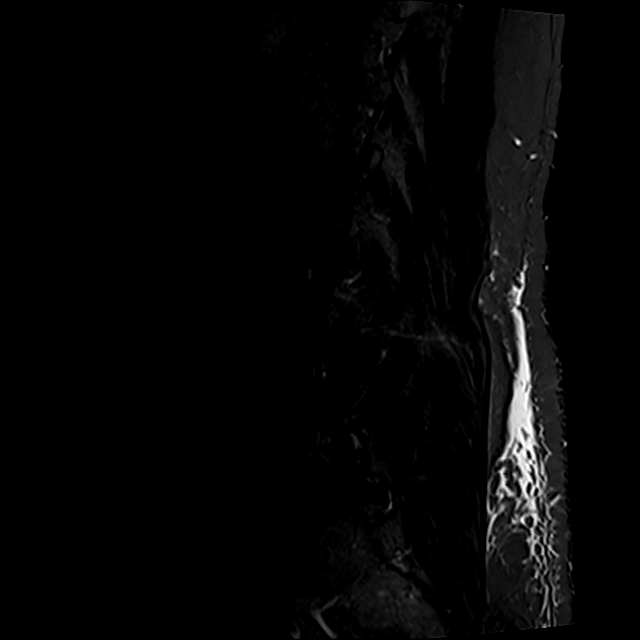

[Series 8: T2 · axial · 4.0mm · 0.78mm/px · z∈[-115,+92]mm · 8 of 36 slices shown (2 of 2)]
[im 1/36]
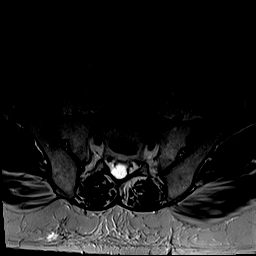
[im 6/36]
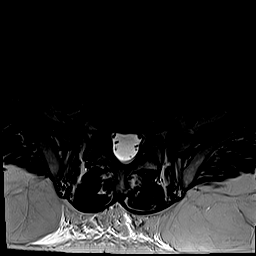
[im 11/36]
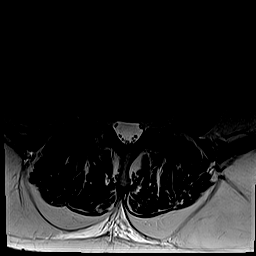
[im 17/36]
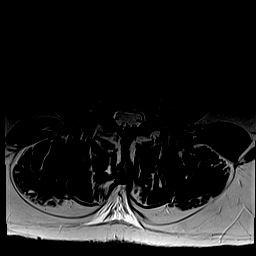
[im 19/36]
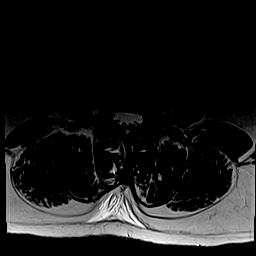
[im 25/36]
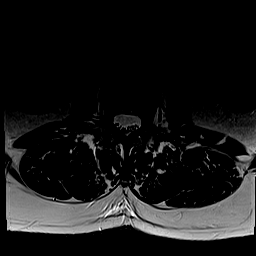
[im 30/36]
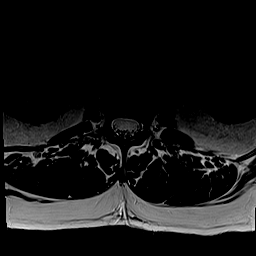
[im 36/36]
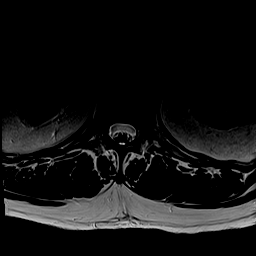

[Series 9: T1 · axial · 4.0mm · 0.39mm/px · z∈[-115,+92]mm · 8 of 36 slices shown (2 of 2)]
[im 1/36]
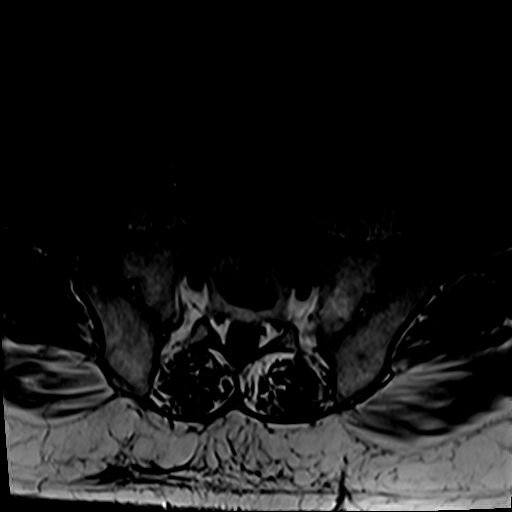
[im 6/36]
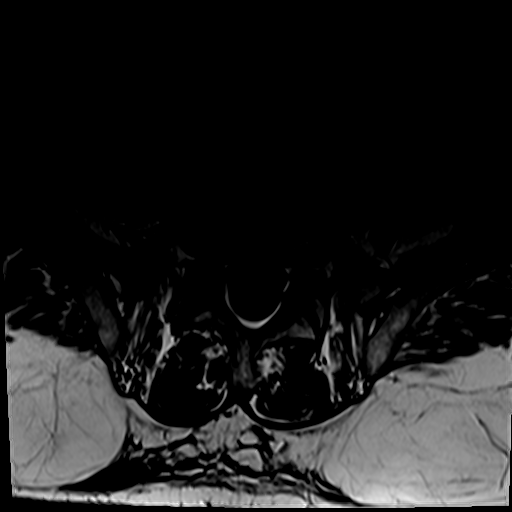
[im 11/36]
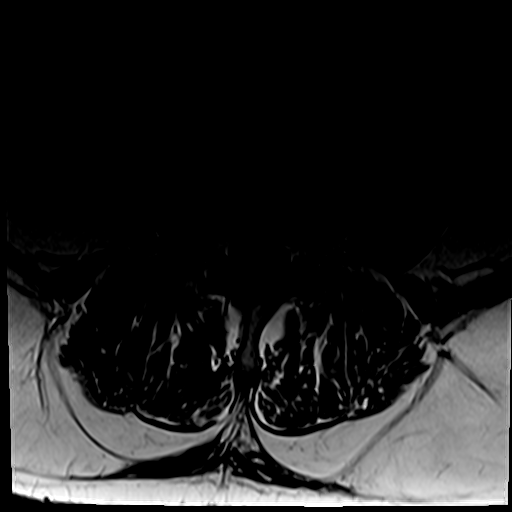
[im 17/36]
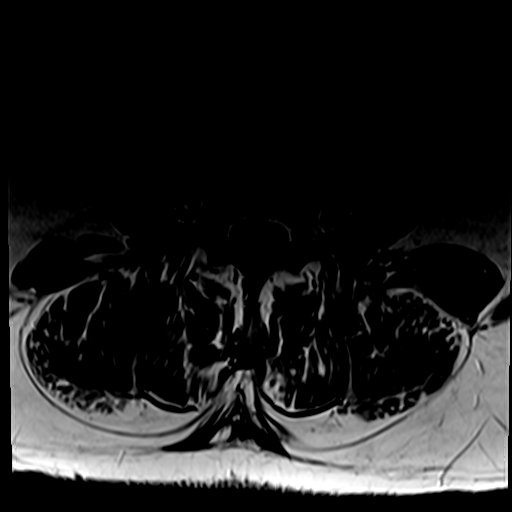
[im 19/36]
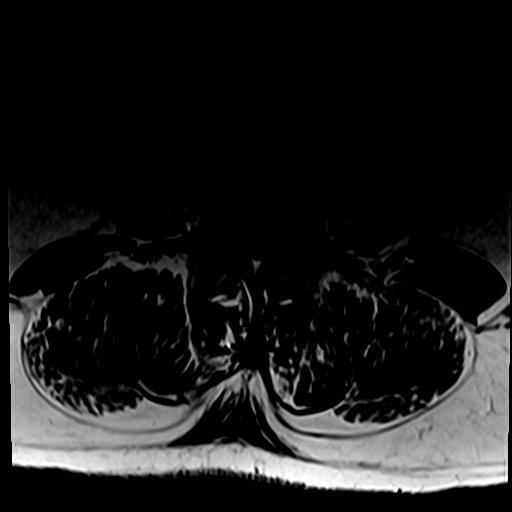
[im 25/36]
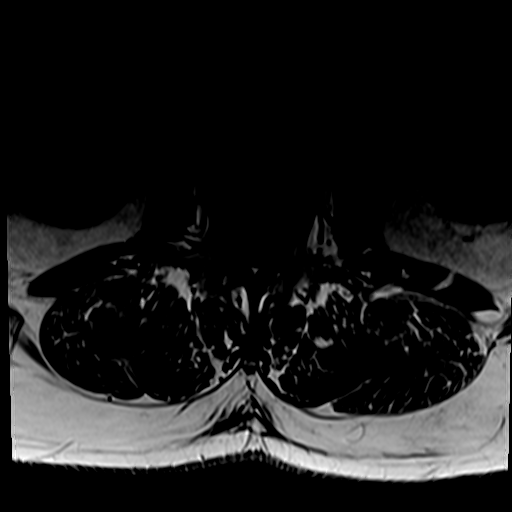
[im 30/36]
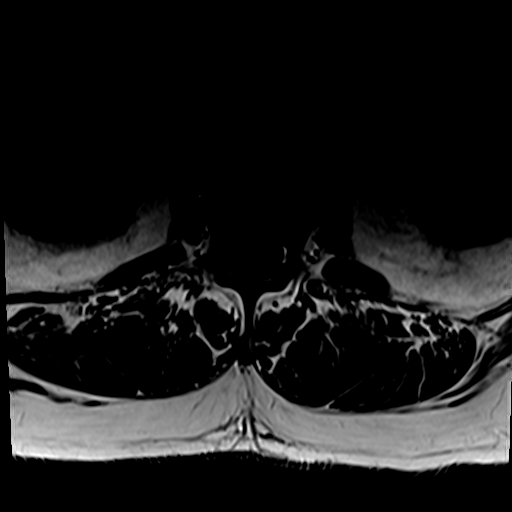
[im 36/36]
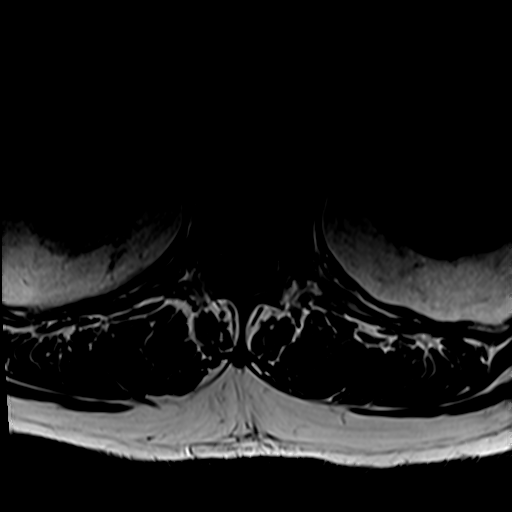

[31 of 48 positions shown; findings below may reference images not displayed]

FINDINGS: Segmentation:  Standard.

Alignment: Grade 1 anterolisthesis of L5 on S1 secondary to
bilateral L5 pars interarticularis defects.

Vertebrae: No acute fracture, evidence of discitis, or aggressive
bone lesion.

Conus medullaris and cauda equina: Conus extends to the T12 level.
Conus and cauda equina appear normal.

Paraspinal and other soft tissues: No acute paraspinal abnormality.

Disc levels:

Disc spaces: Disc desiccation throughout the thoracolumbar spine.
Mild disc height loss at L5-S1.

T12-L1: No significant disc bulge. No neural foraminal stenosis. No
central canal stenosis.

L1-L2: No significant disc bulge. No neural foraminal stenosis. No
central canal stenosis.

L2-L3: No significant disc bulge. No neural foraminal stenosis. No
central canal stenosis. Mild bilateral facet arthropathy.

L3-L4: Mild bilateral facet arthropathy. No foraminal or central
canal stenosis.

L4-L5: Mild bilateral facet arthropathy with bilateral facet
effusions. No foraminal or central canal stenosis.

L5-S1: Mild broad-based disc bulge. Moderate right and severe left
foraminal stenosis. Mild bilateral facet arthropathy.
IMPRESSION: 1. No acute osseous injury of the lumbar spine.
2. Grade 1 anterolisthesis of L5 on S1 secondary to bilateral L5
pars interarticularis defects. At L5-S1 there is a mild broad-based
disc bulge. Moderate right and severe left foraminal stenosis. Mild
bilateral facet arthropathy. Overall no significant interval change
compared with 10/19/2016.

## 2023-12-06 ENCOUNTER — Ambulatory Visit: Payer: Medicare Other | Admitting: Internal Medicine

## 2023-12-20 ENCOUNTER — Encounter (INDEPENDENT_AMBULATORY_CARE_PROVIDER_SITE_OTHER): Payer: Self-pay

## 2023-12-23 ENCOUNTER — Other Ambulatory Visit (INDEPENDENT_AMBULATORY_CARE_PROVIDER_SITE_OTHER): Payer: Self-pay | Admitting: Nurse Practitioner

## 2023-12-23 DIAGNOSIS — L819 Disorder of pigmentation, unspecified: Secondary | ICD-10-CM

## 2023-12-28 ENCOUNTER — Ambulatory Visit (INDEPENDENT_AMBULATORY_CARE_PROVIDER_SITE_OTHER): Payer: Self-pay | Admitting: Nurse Practitioner

## 2023-12-28 ENCOUNTER — Encounter (INDEPENDENT_AMBULATORY_CARE_PROVIDER_SITE_OTHER): Payer: Self-pay | Admitting: Nurse Practitioner

## 2023-12-28 ENCOUNTER — Ambulatory Visit (INDEPENDENT_AMBULATORY_CARE_PROVIDER_SITE_OTHER): Payer: Self-pay

## 2023-12-28 VITALS — BP 170/75 | HR 73 | Resp 18 | Ht 68.0 in | Wt 256.0 lb

## 2023-12-28 DIAGNOSIS — L819 Disorder of pigmentation, unspecified: Secondary | ICD-10-CM

## 2023-12-28 DIAGNOSIS — M51379 Other intervertebral disc degeneration, lumbosacral region without mention of lumbar back pain or lower extremity pain: Secondary | ICD-10-CM | POA: Diagnosis not present

## 2023-12-28 DIAGNOSIS — E7800 Pure hypercholesterolemia, unspecified: Secondary | ICD-10-CM | POA: Diagnosis not present

## 2023-12-29 LAB — VAS US ABI WITH/WO TBI
Left ABI: 1.2
Right ABI: 1.17

## 2023-12-30 ENCOUNTER — Encounter (INDEPENDENT_AMBULATORY_CARE_PROVIDER_SITE_OTHER): Payer: Self-pay | Admitting: Nurse Practitioner

## 2023-12-30 ENCOUNTER — Other Ambulatory Visit: Payer: Self-pay | Admitting: Internal Medicine

## 2023-12-30 ENCOUNTER — Encounter (INDEPENDENT_AMBULATORY_CARE_PROVIDER_SITE_OTHER): Payer: Self-pay

## 2024-01-02 NOTE — Progress Notes (Signed)
 Subjective:    [name], male    DOB: 09/30/1934, 89 y.o.   MRN: [id number] Chief Complaint  Patient presents with   New Patient (Initial Visit)    np. ABI + consult. toes are cooler + bluish hue + pain. patel, mayur.    The patient is an 89 year old male who was referred by Dr. Lydia Sams given his pain in his lower extremities and discoloration of his toes.  He notes that he has pain in his toes when he tries to balance forward.  This happens whether he is standing or actively ambulating.  He does have significant balance issues related to his lower back issues.  He does not note that the discoloration of his toes changes with elevation but he also has not paid significant attention.  He denies any open wounds or ulcerations.  He denies any significant rest pain.  Today noninvasive studies show an ABI of 1.17 on the right and 1.20 on the left.  He has a TBI of of 1.00 on the right and 1.14 on the left.  He has triphasic tibial artery waveforms bilaterally and normal toe waveforms bilaterally.    Review of Systems  Cardiovascular:  Positive for leg swelling.  Musculoskeletal:  Positive for arthralgias, back pain and gait problem.  Neurological:  Positive for weakness.  All other systems reviewed and are negative.      Objective:    Physical Exam Vitals reviewed.  HENT:     Head: Normocephalic.  Cardiovascular:     Rate and Rhythm: Normal rate.     Pulses:          Dorsalis pedis pulses are detected w/ Doppler on the right side and detected w/ Doppler on the left side.       Posterior tibial pulses are detected w/ Doppler on the right side and detected w/ Doppler on the left side.  Pulmonary:     Effort: Pulmonary effort is normal.  Musculoskeletal:     Right lower leg: No edema.     Left lower leg: No edema.  Skin:    General: Skin is warm and dry.  Neurological:     Mental Status: He is alert and oriented to person, place, and time.  Psychiatric:        Mood  and Affect: Mood normal.        Behavior: Behavior normal.        Thought Content: Thought content normal.        Judgment: Judgment normal.     BP (!) 170/75   Pulse 73   Resp 18   Ht 5\' 8"  (1.727 m)   Wt 256 lb (116.1 kg)   BMI 38.92 kg/m   Past Medical History:  Diagnosis Date   Acute postoperative pain 03/21/2018   Anemia    Arthritis    Barrett esophagus    Cancer (HCC)    prostate,skin   Chicken pox    Diverticulitis    Dysrhythmia    GERD (gastroesophageal reflux disease)    Hyperlipidemia    Hypertension    Hypothyroidism    Kidney disease    Melanoma (HCC)    Malignant resection   Rheumatoid arteritis (HCC)    Sleep apnea    Ulcer     Social History   Socioeconomic History   Marital status: Widowed    Spouse name: Not on file   Number of children: Not on file   Years of education:  Not on file   Highest education level: 11th grade  Occupational History   Not on file  Tobacco Use   Smoking status: Never   Smokeless tobacco: Never  Vaping Use   Vaping status: Never Used  Substance and Sexual Activity   Alcohol use: No    Alcohol/week: 0.0 standard drinks of alcohol   Drug use: No   Sexual activity: Never  Other Topics Concern   Not on file  Social History Narrative   Not on file   Social Drivers of Health   Financial Resource Strain: Low Risk  (09/03/2023)   Overall Financial Resource Strain (CARDIA)    Difficulty of Paying Living Expenses: Not very hard  Food Insecurity: No Food Insecurity (09/03/2023)   Hunger Vital Sign    Worried About Running Out of Food in the Last Year: Never true    Ran Out of Food in the Last Year: Never true  Transportation Needs: No Transportation Needs (09/03/2023)   PRAPARE - Administrator, Civil Service (Medical): No    Lack of Transportation (Non-Medical): No  Physical Activity: Inactive (09/03/2023)   Exercise Vital Sign    Days of Exercise per Week: 0 days    Minutes of Exercise per Session: 0  min  Stress: No Stress Concern Present (09/03/2023)   [employer] of Occupational Health - Occupational Stress Questionnaire    Feeling of Stress : Only a little  Social Connections: Socially Isolated (09/03/2023)   Social Connection and Isolation Panel [NHANES]    Frequency of Communication with Friends and Family: More than three times a week    Frequency of Social Gatherings with Friends and Family: Three times a week    Attends Religious Services: Never    Active Member of Clubs or Organizations: No    Attends Banker Meetings: Never    Marital Status: Widowed  Intimate Partner Violence: Not At Risk (06/27/2023)   Humiliation, Afraid, Rape, and Kick questionnaire    Fear of Current or Ex-Partner: No    Emotionally Abused: No    Physically Abused: No    Sexually Abused: No    Past Surgical History:  Procedure Laterality Date   CARDIAC CATHETERIZATION     Cataract Surgery Right 02/13/14   COLON SURGERY  2006-2008-2011   polyps removed   COLONOSCOPY W/ POLYPECTOMY     COLONOSCOPY WITH PROPOFOL  N/A 02/15/2018   Procedure: COLONOSCOPY WITH PROPOFOL ;  Surgeon: Cassie Click, MD;  Location: [facility] ENDOSCOPY;  Service: Endoscopy;  Laterality: N/A;   COLONOSCOPY WITH PROPOFOL  N/A 03/24/2020   Procedure: COLONOSCOPY WITH PROPOFOL ;  Surgeon: Shane Darling, MD;  Location: [facility] ENDOSCOPY;  Service: Endoscopy;  Laterality: N/A;   cystocopy  2003   CYSTOSCOPY/RETROGRADE/URETEROSCOPY/STONE EXTRACTION WITH BASKET Right 10/28/2020   Procedure: CYSTOSCOPY/RETROGRADE PYELOGRAM/STONE EXTRACTION;  Surgeon: Geraline Knapp, MD;  Location: [facility] ORS;  Service: Urology;  Laterality: Right;   ESOPHAGOGASTRODUODENOSCOPY (EGD) WITH PROPOFOL  N/A 08/04/2016   Procedure: ESOPHAGOGASTRODUODENOSCOPY (EGD) WITH PROPOFOL ;  Surgeon: Cassie Click, MD;  Location: [facility] ENDOSCOPY;  Service: Endoscopy;  Laterality: N/A;   ESOPHAGOGASTRODUODENOSCOPY (EGD) WITH PROPOFOL  N/A 03/24/2020   Procedure:  ESOPHAGOGASTRODUODENOSCOPY (EGD) WITH PROPOFOL ;  Surgeon: Shane Darling, MD;  Location: [facility] ENDOSCOPY;  Service: Endoscopy;  Laterality: N/A;   HEMORRHOID SURGERY     PROSTATE SURGERY     sleep study      Family History  Problem Relation Age of Onset   Liver disease Mother    Heart  disease Father    Kidney disease Father    Breast cancer Daughter     Allergies  Allergen Reactions   Tape Other (See Comments)    Surgical       Latest Ref Rng & Units 09/05/2023    1:56 PM 04/28/2023   11:16 AM 06/01/2022    2:04 PM  CBC  WBC 4.0 - 10.5 K/uL 10.6  9.2  8.4   Hemoglobin 13.0 - 17.0 g/dL 29.5  62.1  30.8   Hematocrit 39.0 - 52.0 % 43.8  44.6  46.7   Platelets 150.0 - 400.0 K/uL 223.0  295.0  209.0        CMP     Component Value Date/Time   NA 141 09/05/2023 1356   NA 142 06/13/2017 1416   K 4.2 09/05/2023 1356   CL 99 09/05/2023 1356   CO2 30 09/05/2023 1356   GLUCOSE 93 09/05/2023 1356   BUN 19 09/05/2023 1356   BUN 13 06/13/2017 1416   CREATININE 1.64 (H) 09/05/2023 1356   CALCIUM  9.6 09/05/2023 1356   PROT 6.6 09/05/2023 1356   PROT 7.1 06/13/2017 1416   ALBUMIN 4.0 09/05/2023 1356   ALBUMIN 4.3 06/13/2017 1416   AST 38 (H) 09/05/2023 1356   ALT 35 09/05/2023 1356   ALKPHOS 65 09/05/2023 1356   BILITOT 0.6 09/05/2023 1356   BILITOT 0.4 06/13/2017 1416   GFR 37.71 (L) 09/05/2023 1356   GFRNONAA 38 (L) 10/18/2020 1042     VAS US  ABI WITH/WO TBI Result Date: 12/29/2023  LOWER EXTREMITY DOPPLER STUDY Patient Name:  RONIN REHFELDT  Date of Exam:   12/28/2023 Medical Rec #: [id number]     Accession #:    [id number] Date of Birth: 06-Jul-1935     Patient Gender: M Patient Age:   49 years Exam Location:  [facility] Procedure:      VAS US  ABI WITH/WO TBI Referring Phys: Devon Fogo --------------------------------------------------------------------------------  Indications: Discoloration in toes  Performing Technologist: Tonie Franks RVS   Examination Guidelines: A complete evaluation includes at minimum, Doppler waveform signals and systolic blood pressure reading at the level of bilateral brachial, anterior tibial, and posterior tibial arteries, when vessel segments are accessible. Bilateral testing is considered an integral part of a complete examination. Photoelectric Plethysmograph (PPG) waveforms and toe systolic pressure readings are included as required and additional duplex testing as needed. Limited examinations for reoccurring indications may be performed as noted.  ABI Findings: +---------+------------------+-----+---------+--------+ Right    Rt Pressure (mmHg)IndexWaveform Comment  +---------+------------------+-----+---------+--------+ Brachial 204                                      +---------+------------------+-----+---------+--------+ ATA      243               1.17 triphasic         +---------+------------------+-----+---------+--------+ PTA      243               1.17 triphasic         +---------+------------------+-----+---------+--------+ Great Toe206               1.00 Normal            +---------+------------------+-----+---------+--------+ +---------+------------------+-----+---------+-------+ Left     Lt Pressure (mmHg)IndexWaveform Comment +---------+------------------+-----+---------+-------+ Brachial 207                                     +---------+------------------+-----+---------+-------+  ATA      244               1.18 triphasic        +---------+------------------+-----+---------+-------+ PTA      249               1.20 triphasic        +---------+------------------+-----+---------+-------+ Great Toe235               1.14 Normal           +---------+------------------+-----+---------+-------+ +-------+-----------+-----------+------------+------------+ ABI/TBIToday's ABIToday's TBIPrevious ABIPrevious TBI  +-------+-----------+-----------+------------+------------+ Right  1.17       1.00                                +-------+-----------+-----------+------------+------------+ Left   1.20       1.14                                +-------+-----------+-----------+------------+------------+ TOES Findings: +----------+---------------+--------+-------+ Right ToesPressure (mmHg)WaveformComment +----------+---------------+--------+-------+ 1st Digit 26             Normal          +----------+---------------+--------+-------+ 2nd Digit 9              Normal          +----------+---------------+--------+-------+ 3rd Digit 11             Normal          +----------+---------------+--------+-------+ 4th Digit 7              Normal          +----------+---------------+--------+-------+ 5th Digit 11             Normal          +----------+---------------+--------+-------+  +---------+---------------+--------+-------+ Left ToesPressure (mmHg)WaveformComment +---------+---------------+--------+-------+ 1st Digit10             Normal          +---------+---------------+--------+-------+ 2nd Digit6              Normal          +---------+---------------+--------+-------+ 3rd Digit9              Normal          +---------+---------------+--------+-------+ 4th Digit12             Normal          +---------+---------------+--------+-------+ 5th Digit18             Normal          +---------+---------------+--------+-------+    Summary: Right: Resting right ankle-brachial index is within normal range. The right toe-brachial index is normal. Left: Resting left ankle-brachial index is within normal range. The left toe-brachial index is normal. *See table(s) above for measurements and observations.  Electronically signed by Devon Fogo MD on 12/29/2023 at 4:08:52 PM.    Final        Assessment & Plan:   1. Discoloration of skin of toe (Primary) Today the  patient's ABIs were normal.  No evidence of significant arterial disease.  I suspect the patient's discoloration is related to venous insufficiency, given his swelling as well.  We discussed the use of medical grade compression stockings to help with the swelling.  He should also elevate his lower extremities when possible.  Patient is advised to follow-up if he begins to have any issues  with his episodes of swelling.  2. Degeneration of intervertebral disc of lumbosacral region, unspecified whether pain present I suspect that the patient's balance issues are more so related to his lower back versus vascular disease.  He will continue to follow with PT and rheumatology 3. Hypercholesterolemia Continue statin as ordered and reviewed, no changes at this time   Current Outpatient Medications on File Prior to Visit  Medication Sig Dispense Refill   acetaminophen  (TYLENOL ) 500 MG tablet Take 500 mg by mouth every 6 (six) hours as needed.     allopurinol  (ZYLOPRIM ) 300 MG tablet Take 300 mg by mouth daily.     aspirin 81 MG tablet Take 81 mg by mouth daily.     bisacodyl (DULCOLAX) 5 MG EC tablet Take 5 mg by mouth daily as needed for moderate constipation.     fluticasone  (FLONASE ) 50 MCG/ACT nasal spray TWO PUFFS IN EACH NOSTRIL ONCE A DAY 16 g 4   folic acid (FOLVITE) 1 MG tablet Take 1 mg by mouth daily.     gabapentin  (NEURONTIN ) 100 MG capsule Take 1 capsule (100 mg total) by mouth 2 (two) times daily. 180 capsule 3   hydroxychloroquine (PLAQUENIL) 200 MG tablet Take 200 mg by mouth 2 (two) times daily.     levothyroxine  (SYNTHROID ) 137 MCG tablet TAKE 1 TABLET BY MOUTH DAILY BEFORE BREAKFAST 90 tablet 1   losartan (COZAAR) 25 MG tablet Take 25 mg by mouth daily.     methotrexate (RHEUMATREX) 2.5 MG tablet Take 15 mg by mouth once a week.     metolazone (ZAROXOLYN) 2.5 MG tablet Take 2.5 mg by mouth once a week.     naloxone  (NARCAN ) nasal spray 4 mg/0.1 mL Place 1 spray into the nose as needed  for up to 365 doses (for opioid-induced respiratory depresssion). In case of emergency (overdose), spray once into each nostril. If no response within 3 minutes, repeat application and call 911. 1 each 0   omeprazole  (PRILOSEC) 20 MG capsule TAKE 1 CAPSULE BY MOUTH TWICE DAILY 60 capsule 3   senna (SENOKOT) 8.6 MG TABS tablet Take 2 tablets by mouth daily.     torsemide (DEMADEX) 10 MG tablet Take 10 mg by mouth daily as needed.     traMADol  (ULTRAM ) 50 MG tablet Take 1 tablet (50 mg total) by mouth in the morning, at noon, and at bedtime. Each refill must last 30 days. 90 tablet 5   triamcinolone  cream (KENALOG ) 0.1 % Apply 1 Application topically 2 (two) times daily. 30 g 0   VITAMIN D , CHOLECALCIFEROL, PO Take 250 mcg by mouth once a week.     irbesartan  (AVAPRO ) 300 MG tablet Take 1 tablet (300 mg total) by mouth daily. (Patient not taking: Reported on 12/28/2023) 90 tablet 1   No current facility-administered medications on file prior to visit.    There are no Patient Instructions on file for this visit. No follow-ups on file.   Bairon Klemann E Lovell Nuttall, NP

## 2024-01-16 DIAGNOSIS — R600 Localized edema: Secondary | ICD-10-CM | POA: Diagnosis not present

## 2024-01-16 DIAGNOSIS — N1832 Chronic kidney disease, stage 3b: Secondary | ICD-10-CM | POA: Diagnosis not present

## 2024-01-16 DIAGNOSIS — I129 Hypertensive chronic kidney disease with stage 1 through stage 4 chronic kidney disease, or unspecified chronic kidney disease: Secondary | ICD-10-CM | POA: Diagnosis not present

## 2024-01-16 DIAGNOSIS — I10 Essential (primary) hypertension: Secondary | ICD-10-CM | POA: Diagnosis not present

## 2024-01-25 ENCOUNTER — Encounter: Payer: Self-pay | Admitting: Nurse Practitioner

## 2024-01-25 ENCOUNTER — Ambulatory Visit: Payer: Medicare Other | Attending: Pain Medicine | Admitting: Nurse Practitioner

## 2024-01-25 DIAGNOSIS — Z79899 Other long term (current) drug therapy: Secondary | ICD-10-CM | POA: Insufficient documentation

## 2024-01-25 DIAGNOSIS — G894 Chronic pain syndrome: Secondary | ICD-10-CM | POA: Diagnosis not present

## 2024-01-25 DIAGNOSIS — Z79891 Long term (current) use of opiate analgesic: Secondary | ICD-10-CM | POA: Diagnosis not present

## 2024-01-25 DIAGNOSIS — M4310 Spondylolisthesis, site unspecified: Secondary | ICD-10-CM | POA: Insufficient documentation

## 2024-01-25 DIAGNOSIS — M47816 Spondylosis without myelopathy or radiculopathy, lumbar region: Secondary | ICD-10-CM | POA: Insufficient documentation

## 2024-01-25 DIAGNOSIS — G8929 Other chronic pain: Secondary | ICD-10-CM | POA: Diagnosis not present

## 2024-01-25 DIAGNOSIS — M5442 Lumbago with sciatica, left side: Secondary | ICD-10-CM | POA: Diagnosis not present

## 2024-01-25 DIAGNOSIS — Z8546 Personal history of malignant neoplasm of prostate: Secondary | ICD-10-CM | POA: Insufficient documentation

## 2024-01-25 DIAGNOSIS — M51372 Other intervertebral disc degeneration, lumbosacral region with discogenic back pain and lower extremity pain: Secondary | ICD-10-CM | POA: Insufficient documentation

## 2024-01-25 DIAGNOSIS — M79605 Pain in left leg: Secondary | ICD-10-CM | POA: Insufficient documentation

## 2024-01-25 DIAGNOSIS — F1120 Opioid dependence, uncomplicated: Secondary | ICD-10-CM | POA: Insufficient documentation

## 2024-01-25 MED ORDER — TRAMADOL HCL 50 MG PO TABS: 50.0000 mg | ORAL_TABLET | Freq: Three times a day (TID) | ORAL | 5 refills | Status: DC

## 2024-01-25 NOTE — Progress Notes (Signed)
 PROVIDER NOTE: Interpretation of information contained herein should be left to medically-trained personnel. Specific patient instructions are provided elsewhere under Patient Instructions section of medical record. This document was created in part using AI and STT-dictation technology, any transcriptional errors that may result from this process are unintentional.  Patient: Hector [name]  Service: E/M   PCP: Glendia Shad, MD  DOB: 03/28/1935  DOS: 01/25/2024  Provider: Emmy MARLA Blanch, NP  MRN: [id number]  Delivery: Face-to-face  Specialty: Interventional Pain Management  Type: Established Patient  Setting: Ambulatory outpatient facility  Specialty designation: 09  Referring Prov.: Glendia Shad, MD  Location: Outpatient office facility       History of present illness (HPI) Hector [name], a 89 y.o. year old male, is here today because of his No primary diagnosis found.. Hector [name]'s primary complain today is Leg Pain (Bilateral legs and feet)  Pertinent problems: Hector [name] has Shoulder pain, left; Chronic hip pain (left); History of prostate cancer; chronic low back pain (1ry area of pain) (Bilateral) (L>R) w/sciatica (Left); Polyarthritis, unspecified (secondary area pain); and chronic pain syndrome under pertinent problem list.  Pain Assessment: Severity of Chronic pain is reported as a 1 /10. Location: Leg Right, Left/radiates to feet. Onset: More than a month ago. Quality: Tiring (stinging). Timing: Constant. Modifying factor(s): meds. Vitals:  height is 5' 8 (1.727 m) and weight is 253 lb (114.8 kg). His temperature is 97.9 F (36.6 C). His blood pressure is 141/76 (abnormal) and his pulse is 84. His oxygen saturation is 97%.  BMI: Estimated body mass index is 38.47 kg/m as calculated from the following:   Height as of this encounter: 5' 8 (1.727 m).   Weight as of this encounter: 253 lb (114.8 kg).  Last encounter: 07/20/2023 Last procedure: 05/13/2022  Reason for encounter:  medication management.  The patient indicates doing well with current medication regimen.  No side effects or adverse reaction reported to medication.  The patient continues to experience bilateral leg pain and foot pain, however current medication regimen helps to relief from pain and manage daily activities.  Pharmacotherapy Assessment   Analgesic: Tramadol  (Ultram ) 50 mg 3 times daily as needed for pain. MME=30 Monitoring: [place] PMP: PDMP reviewed during this encounter.       Pharmacotherapy: No side-effects or adverse reactions reported. Compliance: No problems identified. Effectiveness: Clinically acceptable.  Margrette Nathanel PARAS, RN  01/25/2024  1:27 PM  Sign when Signing Visit Safety precautions to be maintained throughout the outpatient stay will include: orient to surroundings, keep bed in low position, maintain call bell within reach at all times, provide assistance with transfer out of bed and ambulation.   Nursing Pain Medication Assessment:  Safety precautions to be maintained throughout the outpatient stay will include: orient to surroundings, keep bed in low position, maintain call bell within reach at all times, provide assistance with transfer out of bed and ambulation.  Medication Inspection Compliance: Pill count conducted under aseptic conditions, in front of the patient. Neither the pills nor the bottle was removed from the patient's sight at any time. Once count was completed pills were immediately returned to the patient in their original bottle.  Medication: Tramadol  (Ultram ) Pill/Patch Count: 45 of 90 pills/patches remain Pill/Patch Appearance: Markings consistent with prescribed medication Bottle Appearance: Standard pharmacy container. Clearly labeled. Filled Date: 6 / 5 / 2025 Last Medication intake:  Today    UDS:  Summary  Date Value Ref Range Status  01/26/2023 Note  Final  Comment:     ==================================================================== ToxASSURE Select 13 (MW) ==================================================================== Test                             Result       Flag       Units  Drug Present and Declared for Prescription Verification   Tramadol                        >5882        EXPECTED   ng/mg creat   O-Desmethyltramadol            582          EXPECTED   ng/mg creat   N-Desmethyltramadol            >5882        EXPECTED   ng/mg creat    Source of tramadol  is a prescription medication. O-desmethyltramadol    and N-desmethyltramadol are expected metabolites of tramadol .  ==================================================================== Test                      Result    Flag   Units      Ref Range   Creatinine              85               mg/dL      >=79 ==================================================================== Declared Medications:  The flagging and interpretation on this report are based on the  following declared medications.  Unexpected results may arise from  inaccuracies in the declared medications.   **Note: The testing scope of this panel includes these medications:   Tramadol  (Ultram )   **Note: The testing scope of this panel does not include the  following reported medications:   Acetaminophen  (Tylenol )  Allopurinol   Amlodipine   Aspirin  Bisacodyl  Fluticasone  (Flonase )  Gabapentin  (Neurontin )  Hydralazine   Hydroxychloroquine  Irbesartan  (Avapro )  Levothyroxine   Methylprednisolone  (Medrol )  Naloxone  (Narcan )  Omeprazole   Sennosides  Torsemide  Valacyclovir  (Valtrex ) ==================================================================== For clinical consultation, please call [phone]. ====================================================================     No results found for: CBDTHCR No results found for: D8THCCBX No results found for: D9THCCBX  ROS  Constitutional: Denies  any fever or chills Gastrointestinal: No reported hemesis, hematochezia, vomiting, or acute GI distress Musculoskeletal: Bilateral leg pain, foot pain Neurological: No reported episodes of acute onset apraxia, aphasia, dysarthria, agnosia, amnesia, paralysis, loss of coordination, or loss of consciousness  Medication Review  Cholecalciferol, acetaminophen , allopurinol , aspirin, bisacodyl, fluticasone , folic acid, gabapentin , hydroxychloroquine, irbesartan , levothyroxine , losartan, methotrexate, metolazone, naloxone , omeprazole , predniSONE , senna, torsemide, traMADol , triamcinolone  cream, and valACYclovir   History Review  Allergy: Hector [name] is allergic to tape. Drug: Hector [name]  reports no history of drug use. Alcohol:  reports no history of alcohol use. Tobacco:  reports that he has never smoked. He has never used smokeless tobacco. Social: Hector [name]  reports that he has never smoked. He has never used smokeless tobacco. He reports that he does not drink alcohol and does not use drugs. Medical:  has a past medical history of Acute postoperative pain (03/21/2018), Anemia, Arthritis, Barrett esophagus, Cancer (HCC), Chicken pox, Diverticulitis, Dysrhythmia, GERD (gastroesophageal reflux disease), Hyperlipidemia, Hypertension, Hypothyroidism, Kidney disease, Melanoma (HCC), Rheumatoid arteritis (HCC), Sleep apnea, and Ulcer. Surgical: Mr. Krummel  has a past surgical history that includes Prostate surgery; cystocopy (2003); Hemorrhoid surgery; Cardiac catheterization; sleep study; Colon surgery (2006-2008-2011);  Cataract Surgery (Right, 02/13/14); Esophagogastroduodenoscopy (egd) with propofol  (N/A, 08/04/2016); Colonoscopy w/ polypectomy; Colonoscopy with propofol  (N/A, 02/15/2018); Colonoscopy with propofol  (N/A, 03/24/2020); Esophagogastroduodenoscopy (egd) with propofol  (N/A, 03/24/2020); and Cystoscopy/retrograde/ureteroscopy/stone extraction with basket (Right, 10/28/2020). Family: family history includes  Breast cancer in his daughter; Heart disease in his father; Kidney disease in his father; Liver disease in his mother.  Laboratory Chemistry Profile   Renal Lab Results  Component Value Date   BUN 19 09/05/2023   CREATININE 1.64 (H) 09/05/2023   LABCREA 125 04/28/2023   BCR 10 06/13/2017   GFR 37.71 (L) 09/05/2023   GFRAA >60 01/23/2018   GFRNONAA 38 (L) 10/18/2020    Hepatic Lab Results  Component Value Date   AST 38 (H) 09/05/2023   ALT 35 09/05/2023   ALBUMIN 4.0 09/05/2023   ALKPHOS 65 09/05/2023   LIPASE 27 10/18/2020    Electrolytes Lab Results  Component Value Date   NA 141 09/05/2023   K 4.2 09/05/2023   CL 99 09/05/2023   CALCIUM  9.6 09/05/2023   MG 1.8 09/05/2023   PHOS 2.6 04/28/2023    Bone Lab Results  Component Value Date   VD25OH 33.78 04/06/2022   25OHVITD1 20 (L) 06/13/2017   25OHVITD2 <1.0 06/13/2017   25OHVITD3 20 06/13/2017    Inflammation (CRP: Acute Phase) (ESR: Chronic Phase) Lab Results  Component Value Date   CRP 2.8 06/13/2017   ESRSEDRATE 13 06/13/2017         Note: Above Lab results reviewed.  Recent Imaging Review  VAS US  ABI WITH/WO TBI  LOWER EXTREMITY DOPPLER STUDY  Patient Name:  AMDREW OBOYLE  Date of Exam:   12/28/2023 Medical Rec #: [id number]     Accession #:    [id number] Date of Birth: October 09, 1934     Patient Gender: M Patient Age:   80 years Exam Location:  [facility] Procedure:      VAS US  ABI WITH/WO TBI Referring Phys: Cordella Shawl  --------------------------------------------------------------------------------   Indications: Discoloration in toes   Performing Technologist: Leafy Gibes RVS    Examination Guidelines: A complete evaluation includes at minimum, Doppler waveform signals and systolic blood pressure reading at the level of bilateral brachial, anterior tibial, and posterior tibial arteries, when vessel segments are accessible. Bilateral testing is considered an integral part  of a complete examination. Photoelectric Plethysmograph (PPG) waveforms and toe systolic pressure readings are included as required and additional duplex testing as needed. Limited examinations for reoccurring indications may be performed as noted.    ABI Findings: +---------+------------------+-----+---------+--------+ Right    Rt Pressure (mmHg)IndexWaveform Comment  +---------+------------------+-----+---------+--------+ Brachial 204                                      +---------+------------------+-----+---------+--------+ ATA      243               1.17 triphasic         +---------+------------------+-----+---------+--------+ PTA      243               1.17 triphasic         +---------+------------------+-----+---------+--------+ Great Toe206               1.00 Normal            +---------+------------------+-----+---------+--------+  +---------+------------------+-----+---------+-------+ Left     Lt Pressure (mmHg)IndexWaveform Comment +---------+------------------+-----+---------+-------+ Brachial 207                                     +---------+------------------+-----+---------+-------+  ATA      244               1.18 triphasic        +---------+------------------+-----+---------+-------+ PTA      249               1.20 triphasic        +---------+------------------+-----+---------+-------+ Great Toe235               1.14 Normal           +---------+------------------+-----+---------+-------+  +-------+-----------+-----------+------------+------------+ ABI/TBIToday's ABIToday's TBIPrevious ABIPrevious TBI +-------+-----------+-----------+------------+------------+ Right  1.17       1.00                                +-------+-----------+-----------+------------+------------+ Left   1.20       1.14                                 +-------+-----------+-----------+------------+------------+  TOES Findings: +----------+---------------+--------+-------+ Right ToesPressure (mmHg)WaveformComment +----------+---------------+--------+-------+ 1st Digit 26             Normal          +----------+---------------+--------+-------+ 2nd Digit 9              Normal          +----------+---------------+--------+-------+ 3rd Digit 11             Normal          +----------+---------------+--------+-------+ 4th Digit 7              Normal          +----------+---------------+--------+-------+ 5th Digit 11             Normal          +----------+---------------+--------+-------+     +---------+---------------+--------+-------+ Left ToesPressure (mmHg)WaveformComment +---------+---------------+--------+-------+ 1st Digit10             Normal          +---------+---------------+--------+-------+ 2nd Digit6              Normal          +---------+---------------+--------+-------+ 3rd Digit9              Normal          +---------+---------------+--------+-------+ 4th Digit12             Normal          +---------+---------------+--------+-------+ 5th Digit18             Normal          +---------+---------------+--------+-------+          Summary: Right: Resting right ankle-brachial index is within normal range. The right toe-brachial index is normal.  Left: Resting left ankle-brachial index is within normal range. The left toe-brachial index is normal.  *See table(s) above for measurements and observations.    Electronically signed by Cordella Shawl MD on 12/29/2023 at 4:08:52 PM.      Final   Note: Reviewed        Physical Exam  General appearance: Well nourished, well developed, and well hydrated. In no apparent acute distress Mental status: Alert, oriented x 3 (person, place, & time)       Respiratory: No evidence of acute respiratory  distress Eyes: PERLA Vitals: BP (!) 141/76   Pulse 84   Temp 97.9 F (36.6 C)  Ht 5' 8 (1.727 m)   Wt 253 lb (114.8 kg)   SpO2 97%   BMI 38.47 kg/m  BMI: Estimated body mass index is 38.47 kg/m as calculated from the following:   Height as of this encounter: 5' 8 (1.727 m).   Weight as of this encounter: 253 lb (114.8 kg). Ideal: Ideal body weight: 68.4 kg (150 lb 12.7 oz) Adjusted ideal body weight: 86.9 kg (191 lb 10.8 oz)  Assessment   Diagnosis Status  1. Chronic pain syndrome   2. Pharmacologic therapy   3. Lumbar facet arthropathy (Bilateral)   4. Uncomplicated opioid dependence (HCC)   5. Lumbar facet syndrome (Bilateral) (L>R)   6. Encounter for medication management   7. Encounter for chronic pain management   8. Chronic use of opiate for therapeutic purpose   9. Chronic low back pain (1ry area of Pain) (Bilateral) (L>R) w/ sciatica (Left)   10. Chronic lower extremity pain (2ry area of Pain) (Left)   11. Degeneration of intervertebral disc of lumbosacral region with discogenic back pain and lower extremity pain   12. History of prostate cancer   13. Lumbosacral Grade 1 Anterolisthesis of L5 over S1    Controlled Controlled Controlled   Updated Problems: No problems updated.  Plan of Care  Problem-specific:  Assessment and Plan We will continue on current medication regimen.  Prescribing drug monitoring (PDMP) reviewed; findings consistent with the use of prescribed medication and no evidence of narcotic misuse or abuse. Routine UDS ordered today.  Schedule follow-up in 6 month with Marnie Fazzino, NP for medication management.  No other new issues or problems reported today's visit.    Hector [name] has a current medication list which includes the following long-term medication(s): fluticasone , gabapentin , irbesartan , levothyroxine , losartan, metolazone, omeprazole , torsemide, and tramadol .  Pharmacotherapy (Medications Ordered): Meds ordered this  encounter  Medications   traMADol  (ULTRAM ) 50 MG tablet    Sig: Take 1 tablet (50 mg total) by mouth in the morning, at noon, and at bedtime. Each refill must last 30 days.    Dispense:  90 tablet    Refill:  5    DO NOT: delete (not duplicate); no partial-fill (will deny script to complete), no refill request (F/U required). DISPENSE: 1 day early if closed on fill date. WARN: No CNS-depressants within 8 hrs of med.   Orders:  Orders Placed This Encounter  Procedures   ToxASSURE Select 13 (MW), Urine    Volume: 30 ml(s). Minimum 3 ml of urine is needed. Document temperature of fresh sample. Indications: Long term (current) use of opiate analgesic (S20.108)    Release to patient:   Immediate        Return in about 6 months (around 07/26/2024) for (F2F), (MM), Emmy Blanch NP.    Recent Visits No visits were found meeting these conditions. Showing recent visits within past 90 days and meeting all other requirements Today's Visits Date Type Provider Dept  01/25/24 Office Visit Sadee Osland K, NP [facility]  Showing today's visits and meeting all other requirements Future Appointments No visits were found meeting these conditions. Showing future appointments within next 90 days and meeting all other requirements  I discussed the assessment and treatment plan with the patient. The patient was provided an opportunity to ask questions and all were answered. The patient agreed with the plan and demonstrated an understanding of the instructions.  Patient advised to call back or seek an in-person evaluation if the symptoms  or condition worsens.  Duration of encounter: 30 minutes.  Total time on encounter, as per AMA guidelines included both the face-to-face and non-face-to-face time personally spent by the physician and/or other qualified health care professional(s) on the day of the encounter (includes time in activities that require the physician or other qualified health care  professional and does not include time in activities normally performed by clinical staff). Physician's time may include the following activities when performed: Preparing to see the patient (e.g., pre-charting review of records, searching for previously ordered imaging, lab work, and nerve conduction tests) Review of prior analgesic pharmacotherapies. Reviewing PMP Interpreting ordered tests (e.g., lab work, imaging, nerve conduction tests) Performing post-procedure evaluations, including interpretation of diagnostic procedures Obtaining and/or reviewing separately obtained history Performing a medically appropriate examination and/or evaluation Counseling and educating the patient/family/caregiver Ordering medications, tests, or procedures Referring and communicating with other health care professionals (when not separately reported) Documenting clinical information in the electronic or other health record Independently interpreting results (not separately reported) and communicating results to the patient/ family/caregiver Care coordination (not separately reported)  Note by: Shanti Eichel K Tamario Heal, NP (TTS and AI technology used. I apologize for any typographical errors that were not detected and corrected.) Date: 01/25/2024; Time: 2:51 PM

## 2024-01-25 NOTE — Progress Notes (Signed)
 Safety precautions to be maintained throughout the outpatient stay will include: orient to surroundings, keep bed in low position, maintain call bell within reach at all times, provide assistance with transfer out of bed and ambulation.   Nursing Pain Medication Assessment:  Safety precautions to be maintained throughout the outpatient stay will include: orient to surroundings, keep bed in low position, maintain call bell within reach at all times, provide assistance with transfer out of bed and ambulation.  Medication Inspection Compliance: Pill count conducted under aseptic conditions, in front of the patient. Neither the pills nor the bottle was removed from the patient's sight at any time. Once count was completed pills were immediately returned to the patient in their original bottle.  Medication: Tramadol  (Ultram ) Pill/Patch Count: 45 of 90 pills/patches remain Pill/Patch Appearance: Markings consistent with prescribed medication Bottle Appearance: Standard pharmacy container. Clearly labeled. Filled Date: 6 / 5 / 2025 Last Medication intake:  Today

## 2024-01-27 ENCOUNTER — Encounter: Payer: Self-pay | Admitting: Internal Medicine

## 2024-01-27 ENCOUNTER — Ambulatory Visit (INDEPENDENT_AMBULATORY_CARE_PROVIDER_SITE_OTHER): Admitting: Internal Medicine

## 2024-01-27 VITALS — BP 128/76 | HR 80 | Temp 98.0°F | Resp 16 | Ht 68.0 in | Wt 253.0 lb

## 2024-01-27 DIAGNOSIS — E7800 Pure hypercholesterolemia, unspecified: Secondary | ICD-10-CM

## 2024-01-27 DIAGNOSIS — E039 Hypothyroidism, unspecified: Secondary | ICD-10-CM

## 2024-01-27 DIAGNOSIS — Z8546 Personal history of malignant neoplasm of prostate: Secondary | ICD-10-CM

## 2024-01-27 DIAGNOSIS — I10 Essential (primary) hypertension: Secondary | ICD-10-CM

## 2024-01-27 DIAGNOSIS — R739 Hyperglycemia, unspecified: Secondary | ICD-10-CM

## 2024-01-27 DIAGNOSIS — R600 Localized edema: Secondary | ICD-10-CM

## 2024-01-27 DIAGNOSIS — N1831 Chronic kidney disease, stage 3a: Secondary | ICD-10-CM

## 2024-01-27 DIAGNOSIS — M109 Gout, unspecified: Secondary | ICD-10-CM

## 2024-01-27 DIAGNOSIS — K2270 Barrett's esophagus without dysplasia: Secondary | ICD-10-CM

## 2024-01-27 LAB — HEPATIC FUNCTION PANEL
ALT: 26 U/L (ref 0–53)
AST: 24 U/L (ref 0–37)
Albumin: 4.2 g/dL (ref 3.5–5.2)
Alkaline Phosphatase: 58 U/L (ref 39–117)
Bilirubin, Direct: 0.2 mg/dL (ref 0.0–0.3)
Total Bilirubin: 0.5 mg/dL (ref 0.2–1.2)
Total Protein: 7.2 g/dL (ref 6.0–8.3)

## 2024-01-27 LAB — LIPID PANEL
Cholesterol: 183 mg/dL (ref 0–200)
HDL: 58.2 mg/dL (ref >39.00)
LDL Cholesterol: 70 mg/dL (ref 0–99)
NonHDL: 124.51
Total CHOL/HDL Ratio: 3
Triglycerides: 274 mg/dL — ABNORMAL HIGH (ref 0.0–149.0)
VLDL: 54.8 mg/dL — ABNORMAL HIGH (ref 0.0–40.0)

## 2024-01-27 LAB — TSH: TSH: 1.51 u[IU]/mL (ref 0.35–5.50)

## 2024-01-27 LAB — HEMOGLOBIN A1C: Hgb A1c MFr Bld: 5.3 % (ref 4.6–6.5)

## 2024-01-27 MED ORDER — IRBESARTAN 300 MG PO TABS: 300.0000 mg | ORAL_TABLET | Freq: Every day | ORAL | 1 refills | Status: DC

## 2024-01-27 MED ORDER — LEVOTHYROXINE SODIUM 137 MCG PO TABS: 137.0000 ug | ORAL_TABLET | Freq: Every day | ORAL | 1 refills | Status: DC

## 2024-01-27 MED ORDER — VALACYCLOVIR HCL 1 G PO TABS: 1000.0000 mg | ORAL_TABLET | Freq: Every day | ORAL | 1 refills | Status: DC

## 2024-01-27 MED ORDER — OMEPRAZOLE 20 MG PO CPDR: 20.0000 mg | DELAYED_RELEASE_CAPSULE | Freq: Two times a day (BID) | ORAL | 3 refills | Status: AC

## 2024-01-27 NOTE — Progress Notes (Unsigned)
 Subjective:    [name], male    DOB: [birth year], 89 y.o.   MRN: [id number]  Patient here for  Chief Complaint  Patient presents with   Medical Management of Chronic Issues    HPI Here for a scheduled follow up - follow up regarding his blood pressure and cholesterol. He is accompanied by his two daughters. History obtained from all three of them. Saw vascular 12/28/23 - evaluation of discoloration of skin of toe. ABIs normal. Had f/u with rheumatology 10/27/23 - gout - continue allopurinol . F/u RA - continue methotrexate and prednisone . Had f/u with nephrology - GFR 39. Continue metolazone once per week.   Past Medical History:  Diagnosis Date   Acute postoperative pain 03/21/2018   Anemia    Arthritis    Barrett esophagus    Cancer (HCC)    prostate,skin   Chicken pox    Diverticulitis    Dysrhythmia    GERD (gastroesophageal reflux disease)    Hyperlipidemia    Hypertension    Hypothyroidism    Kidney disease    Melanoma (HCC)    Malignant resection   Rheumatoid arteritis (HCC)    Sleep apnea    Ulcer    Past Surgical History:  Procedure Laterality Date   CARDIAC CATHETERIZATION     Cataract Surgery Right 02/13/14   COLON SURGERY  2006-2008-2011   polyps removed   COLONOSCOPY W/ POLYPECTOMY     COLONOSCOPY WITH PROPOFOL  N/A 02/15/2018   Procedure: COLONOSCOPY WITH PROPOFOL ;  Surgeon: Viktoria Lamar DASEN, MD;  Location: [facility] ENDOSCOPY;  Service: Endoscopy;  Laterality: N/A;   COLONOSCOPY WITH PROPOFOL  N/A 03/24/2020   Procedure: COLONOSCOPY WITH PROPOFOL ;  Surgeon: Maryruth Ole DASEN, MD;  Location: [facility] ENDOSCOPY;  Service: Endoscopy;  Laterality: N/A;   cystocopy  2003   CYSTOSCOPY/RETROGRADE/URETEROSCOPY/STONE EXTRACTION WITH BASKET Right 10/28/2020   Procedure: CYSTOSCOPY/RETROGRADE PYELOGRAM/STONE EXTRACTION;  Surgeon: Twylla Glendia BROCKS, MD;  Location: [facility] ORS;  Service: Urology;  Laterality: Right;   ESOPHAGOGASTRODUODENOSCOPY (EGD) WITH PROPOFOL   N/A 08/04/2016   Procedure: ESOPHAGOGASTRODUODENOSCOPY (EGD) WITH PROPOFOL ;  Surgeon: Lamar DASEN Viktoria, MD;  Location: [facility] ENDOSCOPY;  Service: Endoscopy;  Laterality: N/A;   ESOPHAGOGASTRODUODENOSCOPY (EGD) WITH PROPOFOL  N/A 03/24/2020   Procedure: ESOPHAGOGASTRODUODENOSCOPY (EGD) WITH PROPOFOL ;  Surgeon: Maryruth Ole DASEN, MD;  Location: [facility] ENDOSCOPY;  Service: Endoscopy;  Laterality: N/A;   HEMORRHOID SURGERY     PROSTATE SURGERY     sleep study     Family History  Problem Relation Age of Onset   Liver disease Mother    Heart disease Father    Kidney disease Father    Breast cancer Daughter    Social History   Socioeconomic History   Marital status: Widowed    Spouse name: Not on file   Number of children: Not on file   Years of education: Not on file   Highest education level: 11th grade  Occupational History   Not on file  Tobacco Use   Smoking status: Never   Smokeless tobacco: Never  Vaping Use   Vaping status: Never Used  Substance and Sexual Activity   Alcohol use: No    Alcohol/week: 0.0 standard drinks of alcohol   Drug use: No   Sexual activity: Never  Other Topics Concern   Not on file  Social History Narrative   Not on file   Social Drivers of Health   Financial Resource Strain: Low Risk  (01/23/2024)   Overall Financial Resource Strain (CARDIA)  Difficulty of Paying Living Expenses: Not very hard  Food Insecurity: No Food Insecurity (01/23/2024)   Hunger Vital Sign    Worried About Running Out of Food in the Last Year: Never true    Ran Out of Food in the Last Year: Never true  Transportation Needs: No Transportation Needs (01/23/2024)   PRAPARE - Administrator, Civil Service (Medical): No    Lack of Transportation (Non-Medical): No  Physical Activity: Inactive (01/23/2024)   Exercise Vital Sign    Days of Exercise per Week: 0 days    Minutes of Exercise per Session: Not on file  Stress: No Stress Concern Present (01/23/2024)    [employer] of Occupational Health - Occupational Stress Questionnaire    Feeling of Stress: Not at all  Social Connections: Unknown (01/23/2024)   Social Connection and Isolation Panel    Frequency of Communication with Friends and Family: More than three times a week    Frequency of Social Gatherings with Friends and Family: More than three times a week    Attends Religious Services: Not on file    Active Member of Clubs or Organizations: No    Attends Banker Meetings: Not on file    Marital Status: Widowed     Review of Systems     Objective:     BP 128/76   Pulse 80   Temp 98 F (36.7 C)   Resp 16   Ht 5' 8 (1.727 m)   Wt 253 lb (114.8 kg)   SpO2 97%   BMI 38.47 kg/m  Wt Readings from Last 3 Encounters:  01/27/24 253 lb (114.8 kg)  01/25/24 253 lb (114.8 kg)  12/28/23 256 lb (116.1 kg)    Physical Exam  {Perform Simple Foot Exam  Perform Detailed exam:1} {Insert foot Exam (Optional):30965}   Outpatient Encounter Medications as of 01/27/2024  Medication Sig   acetaminophen  (TYLENOL ) 500 MG tablet Take 500 mg by mouth every 6 (six) hours as needed.   allopurinol  (ZYLOPRIM ) 300 MG tablet Take 300 mg by mouth daily.   aspirin 81 MG tablet Take 81 mg by mouth daily.   bisacodyl (DULCOLAX) 5 MG EC tablet Take 5 mg by mouth daily as needed for moderate constipation.   fluticasone  (FLONASE ) 50 MCG/ACT nasal spray TWO PUFFS IN EACH NOSTRIL ONCE A DAY   folic acid (FOLVITE) 1 MG tablet Take 1 mg by mouth daily.   gabapentin  (NEURONTIN ) 100 MG capsule Take 1 capsule (100 mg total) by mouth 2 (two) times daily.   hydroxychloroquine (PLAQUENIL) 200 MG tablet Take 200 mg by mouth 2 (two) times daily.   irbesartan  (AVAPRO ) 300 MG tablet Take 1 tablet (300 mg total) by mouth daily.   levothyroxine  (SYNTHROID ) 137 MCG tablet TAKE 1 TABLET BY MOUTH DAILY BEFORE BREAKFAST   losartan (COZAAR) 25 MG tablet Take 25 mg by mouth daily.   methotrexate (RHEUMATREX)  2.5 MG tablet Take 15 mg by mouth once a week.   metolazone (ZAROXOLYN) 2.5 MG tablet Take 2.5 mg by mouth once a week.   omeprazole  (PRILOSEC) 20 MG capsule TAKE 1 CAPSULE BY MOUTH TWICE DAILY   predniSONE  (DELTASONE ) 5 MG tablet Take 5 mg by mouth daily.   senna (SENOKOT) 8.6 MG TABS tablet Take 2 tablets by mouth daily.   torsemide (DEMADEX) 10 MG tablet Take 10 mg by mouth daily as needed.   traMADol  (ULTRAM ) 50 MG tablet Take 1 tablet (50 mg total) by mouth in  the morning, at noon, and at bedtime. Each refill must last 30 days.   triamcinolone  cream (KENALOG ) 0.1 % Apply 1 Application topically 2 (two) times daily.   valACYclovir  (VALTREX ) 1000 MG tablet TAKE 1 TABLET BY MOUTH DAILY   VITAMIN D , CHOLECALCIFEROL, PO Take 250 mcg by mouth once a week.   No facility-administered encounter medications on file as of 01/27/2024.     Lab Results  Component Value Date   WBC 10.6 (H) 09/05/2023   HGB 14.4 09/05/2023   HCT 43.8 09/05/2023   PLT 223.0 09/05/2023   GLUCOSE 93 09/05/2023   CHOL 179 09/05/2023   TRIG 243.0 (H) 09/05/2023   HDL 59.40 09/05/2023   LDLDIRECT 89.0 01/24/2023   LDLCALC 71 09/05/2023   ALT 35 09/05/2023   AST 38 (H) 09/05/2023   NA 141 09/05/2023   K 4.2 09/05/2023   CL 99 09/05/2023   CREATININE 1.64 (H) 09/05/2023   BUN 19 09/05/2023   CO2 30 09/05/2023   TSH 5.78 (H) 04/28/2023   PSA 0.00 (L) 06/16/2020   HGBA1C 5.7 01/24/2023   MICROALBUR 20.3 (H) 09/05/2023    DG PAIN CLINIC C-ARM 1-60 MIN NO REPORT Result Date: 05/13/2022 Fluoro was used, but no Radiologist interpretation will be provided. Please refer to NOTES tab for provider progress note.      Assessment & Plan:  Stage 3a chronic kidney disease (HCC)  Hypercholesterolemia  Hyperglycemia  Primary hypertension  Hypothyroidism, unspecified type     Allena Hamilton, MD

## 2024-01-29 ENCOUNTER — Encounter: Payer: Self-pay | Admitting: Internal Medicine

## 2024-01-29 ENCOUNTER — Ambulatory Visit: Payer: Self-pay | Admitting: Internal Medicine

## 2024-01-29 NOTE — Assessment & Plan Note (Signed)
 Low-carb diet and exercise.  Follow met b and A1c.

## 2024-01-29 NOTE — Assessment & Plan Note (Signed)
 Low cholesterol diet and exercise. Continue crestor . Check lipid panel.

## 2024-01-29 NOTE — Assessment & Plan Note (Signed)
 On thyroid replacement.  Follow tsh.

## 2024-01-29 NOTE — Assessment & Plan Note (Signed)
No upper symptoms.  On prilosec.  Stable. Has been followed by GI.  

## 2024-01-29 NOTE — Assessment & Plan Note (Signed)
 S/p prostatectomy.  Has been followed by Dr Twylla.

## 2024-01-29 NOTE — Assessment & Plan Note (Signed)
 Continue allopurinol

## 2024-01-29 NOTE — Assessment & Plan Note (Signed)
 Taking avapro . Off amlodipine . Torsemide and metolazone as directed.  Follow pressures. Follow metabolic panel.

## 2024-01-29 NOTE — Assessment & Plan Note (Signed)
 Swelling improved. Taking torsemide. Follow. Monitor sodium intake.

## 2024-01-29 NOTE — Assessment & Plan Note (Signed)
 Continues on avapro .  Avoid antiinflammatories.  Follow metabolic panel. Renal ultrasound - no acute abnormality. Have discussed the need to weigh daily.  Monitor sodium intake. Elevate legs. PT - for increased ambulation. Follow metabolic panel.

## 2024-01-30 ENCOUNTER — Other Ambulatory Visit: Payer: Self-pay

## 2024-01-30 LAB — TOXASSURE SELECT 13 (MW), URINE

## 2024-01-30 NOTE — Telephone Encounter (Signed)
 Are you ok with this?

## 2024-01-30 NOTE — Telephone Encounter (Signed)
 In reviewing, it appears that nephrology stopped avapro  and start losartan 25mg  q day. Please confirm not taking avapro . Make sure pharmacy and family aware losartan 25mg  q day and not avapro .

## 2024-02-01 DIAGNOSIS — I129 Hypertensive chronic kidney disease with stage 1 through stage 4 chronic kidney disease, or unspecified chronic kidney disease: Secondary | ICD-10-CM | POA: Diagnosis not present

## 2024-02-01 DIAGNOSIS — I10 Essential (primary) hypertension: Secondary | ICD-10-CM | POA: Diagnosis not present

## 2024-02-01 DIAGNOSIS — N1832 Chronic kidney disease, stage 3b: Secondary | ICD-10-CM | POA: Diagnosis not present

## 2024-02-01 DIAGNOSIS — R600 Localized edema: Secondary | ICD-10-CM | POA: Diagnosis not present

## 2024-02-07 DIAGNOSIS — M0579 Rheumatoid arthritis with rheumatoid factor of multiple sites without organ or systems involvement: Secondary | ICD-10-CM | POA: Diagnosis not present

## 2024-02-07 DIAGNOSIS — M1A09X0 Idiopathic chronic gout, multiple sites, without tophus (tophi): Secondary | ICD-10-CM | POA: Diagnosis not present

## 2024-02-07 DIAGNOSIS — Z7960 Long term (current) use of unspecified immunomodulators and immunosuppressants: Secondary | ICD-10-CM | POA: Diagnosis not present

## 2024-02-07 DIAGNOSIS — M47816 Spondylosis without myelopathy or radiculopathy, lumbar region: Secondary | ICD-10-CM | POA: Diagnosis not present

## 2024-03-14 DIAGNOSIS — I10 Essential (primary) hypertension: Secondary | ICD-10-CM | POA: Diagnosis not present

## 2024-03-14 DIAGNOSIS — H02882 Meibomian gland dysfunction right lower eyelid: Secondary | ICD-10-CM | POA: Diagnosis not present

## 2024-03-14 DIAGNOSIS — H2512 Age-related nuclear cataract, left eye: Secondary | ICD-10-CM | POA: Diagnosis not present

## 2024-03-14 DIAGNOSIS — H26491 Other secondary cataract, right eye: Secondary | ICD-10-CM | POA: Diagnosis not present

## 2024-05-29 ENCOUNTER — Ambulatory Visit: Admitting: Internal Medicine

## 2024-05-29 VITALS — BP 126/70 | HR 65 | Ht 68.0 in | Wt 251.0 lb

## 2024-05-29 DIAGNOSIS — E7800 Pure hypercholesterolemia, unspecified: Secondary | ICD-10-CM | POA: Diagnosis not present

## 2024-05-29 DIAGNOSIS — R739 Hyperglycemia, unspecified: Secondary | ICD-10-CM | POA: Diagnosis not present

## 2024-05-29 DIAGNOSIS — Z23 Encounter for immunization: Secondary | ICD-10-CM | POA: Diagnosis not present

## 2024-05-29 DIAGNOSIS — I10 Essential (primary) hypertension: Secondary | ICD-10-CM | POA: Diagnosis not present

## 2024-05-29 DIAGNOSIS — N1831 Chronic kidney disease, stage 3a: Secondary | ICD-10-CM

## 2024-05-29 DIAGNOSIS — E039 Hypothyroidism, unspecified: Secondary | ICD-10-CM

## 2024-05-29 DIAGNOSIS — M109 Gout, unspecified: Secondary | ICD-10-CM

## 2024-05-29 DIAGNOSIS — K2270 Barrett's esophagus without dysplasia: Secondary | ICD-10-CM

## 2024-05-29 MED ORDER — VALACYCLOVIR HCL 1 G PO TABS: 1000.0000 mg | ORAL_TABLET | Freq: Every day | ORAL | 1 refills | Status: AC

## 2024-05-29 MED ORDER — LEVOTHYROXINE SODIUM 137 MCG PO TABS
137.0000 ug | ORAL_TABLET | Freq: Every day | ORAL | 1 refills | Status: AC
Start: 2024-05-29 — End: ?

## 2024-05-29 NOTE — Progress Notes (Signed)
 Subjective:    [name], male    DOB: 03-Aug-1934, 89 y.o.   MRN: [id number]  Patient here for  Chief Complaint  Patient presents with   Medical Management of Chronic Issues    HPI Here for a scheduled follow up -  follow up regarding his blood pressure and cholesterol. He is accompanied by his two daughters. History obtained from all of them. Saw vascular 12/28/23 - evaluation of discoloration of skin of toe. ABIs normal. Discussed discoloration today. DP pulses are palpable. Discussed keeping feet warm.  Had f/u with rheumatology 02/07/24 - gout - continue allopurinol . F/u RA - continue methotrexate and prednisone . Had f/u with nephrology - GFR 45. Continue metolazone once per week. Continue aspirin and irbesartan . Discussed ambulating around home and staying as active as possible. Breathing overall stable. Bowels stable. Discussed continuing to weigh daily.    Past Medical History:  Diagnosis Date   Acute postoperative pain 03/21/2018   Allergy    Latex   Anemia    Arrhythmia    Arthritis    Barrett esophagus    Cancer (HCC)    prostate,skin   Cataract    Chicken pox    Diverticulitis    Dysrhythmia    GERD (gastroesophageal reflux disease)    Hyperlipidemia    Hypertension    Hypothyroidism    Kidney disease    Melanoma (HCC)    Malignant resection   Rheumatoid arteritis (HCC)    Sleep apnea    Ulcer    Past Surgical History:  Procedure Laterality Date   CARDIAC CATHETERIZATION     Cataract Surgery Right 02/13/14   COLON SURGERY  2006-2008-2011   polyps removed   COLONOSCOPY W/ POLYPECTOMY     COLONOSCOPY WITH PROPOFOL  N/A 02/15/2018   Procedure: COLONOSCOPY WITH PROPOFOL ;  Surgeon: Viktoria Lamar DASEN, MD;  Location: [facility] ENDOSCOPY;  Service: Endoscopy;  Laterality: N/A;   COLONOSCOPY WITH PROPOFOL  N/A 03/24/2020   Procedure: COLONOSCOPY WITH PROPOFOL ;  Surgeon: Maryruth Ole DASEN, MD;  Location: [facility] ENDOSCOPY;  Service: Endoscopy;  Laterality: N/A;    cystocopy  2003   CYSTOSCOPY/RETROGRADE/URETEROSCOPY/STONE EXTRACTION WITH BASKET Right 10/28/2020   Procedure: CYSTOSCOPY/RETROGRADE PYELOGRAM/STONE EXTRACTION;  Surgeon: Twylla Glendia BROCKS, MD;  Location: [facility] ORS;  Service: Urology;  Laterality: Right;   ESOPHAGOGASTRODUODENOSCOPY (EGD) WITH PROPOFOL  N/A 08/04/2016   Procedure: ESOPHAGOGASTRODUODENOSCOPY (EGD) WITH PROPOFOL ;  Surgeon: Lamar DASEN Viktoria, MD;  Location: [facility] ENDOSCOPY;  Service: Endoscopy;  Laterality: N/A;   ESOPHAGOGASTRODUODENOSCOPY (EGD) WITH PROPOFOL  N/A 03/24/2020   Procedure: ESOPHAGOGASTRODUODENOSCOPY (EGD) WITH PROPOFOL ;  Surgeon: Maryruth Ole DASEN, MD;  Location: [facility] ENDOSCOPY;  Service: Endoscopy;  Laterality: N/A;   HEMORRHOID SURGERY     PROSTATE SURGERY     sleep study     Family History  Problem Relation Age of Onset   Liver disease Mother    Heart disease Father    Kidney disease Father    Breast cancer Daughter    Social History   Socioeconomic History   Marital status: Widowed    Spouse name: Not on file   Number of children: Not on file   Years of education: Not on file   Highest education level: 12th grade  Occupational History   Not on file  Tobacco Use   Smoking status: Never   Smokeless tobacco: Never  Vaping Use   Vaping status: Never Used  Substance and Sexual Activity   Alcohol use: No    Alcohol/week: 0.0 standard drinks  of alcohol   Drug use: No   Sexual activity: Never  Other Topics Concern   Not on file  Social History Narrative   Not on file   Social Drivers of Health   Financial Resource Strain: Low Risk  (05/29/2024)   Overall Financial Resource Strain (CARDIA)    Difficulty of Paying Living Expenses: Not very hard  Food Insecurity: No Food Insecurity (05/29/2024)   Hunger Vital Sign    Worried About Running Out of Food in the Last Year: Never true    Ran Out of Food in the Last Year: Never true  Transportation Needs: No Transportation Needs (05/29/2024)   PRAPARE  - Administrator, Civil Service (Medical): No    Lack of Transportation (Non-Medical): No  Physical Activity: Inactive (05/29/2024)   Exercise Vital Sign    Days of Exercise per Week: 0 days    Minutes of Exercise per Session: Not on file  Stress: No Stress Concern Present (05/29/2024)   [employer] of Occupational Health - Occupational Stress Questionnaire    Feeling of Stress: Not at all  Social Connections: Socially Isolated (05/29/2024)   Social Connection and Isolation Panel    Frequency of Communication with Friends and Family: More than three times a week    Frequency of Social Gatherings with Friends and Family: Once a week    Attends Religious Services: Patient declined    Database Administrator or Organizations: No    Attends Banker Meetings: Not on file    Marital Status: Widowed     Review of Systems  Constitutional:  Negative for appetite change and unexpected weight change.  HENT:  Negative for congestion and sinus pressure.   Respiratory:  Negative for cough and chest tightness.        Breathing stable.   Cardiovascular:  Negative for chest pain and palpitations.       Leg swelling improved.   Gastrointestinal:  Negative for diarrhea, nausea and vomiting.  Genitourinary:  Negative for difficulty urinating and dysuria.  Musculoskeletal:  Negative for joint swelling and myalgias.  Skin:  Negative for color change and rash.  Neurological:  Negative for dizziness and headaches.  Psychiatric/Behavioral:  Negative for agitation and dysphoric mood.        Objective:     BP 126/70   Pulse 65   Ht 5' 8 (1.727 m)   Wt 251 lb (113.9 kg) Comment: pt reported  SpO2 96%   BMI 38.16 kg/m  Wt Readings from Last 3 Encounters:  05/29/24 251 lb (113.9 kg)  01/27/24 253 lb (114.8 kg)  01/25/24 253 lb (114.8 kg)    Physical Exam Vitals reviewed.  Constitutional:      General: He is not in acute distress.    Appearance: Normal  appearance. He is well-developed.  HENT:     Head: Normocephalic and atraumatic.     Right Ear: External ear normal.     Left Ear: External ear normal.     Mouth/Throat:     Pharynx: No oropharyngeal exudate or posterior oropharyngeal erythema.  Eyes:     General: No scleral icterus.       Right eye: No discharge.        Left eye: No discharge.     Conjunctiva/sclera: Conjunctivae normal.  Cardiovascular:     Rate and Rhythm: Normal rate and regular rhythm.  Pulmonary:     Effort: Pulmonary effort is normal. No respiratory distress.  Breath sounds: Normal breath sounds.  Abdominal:     General: Bowel sounds are normal.     Palpations: Abdomen is soft.     Tenderness: There is no abdominal tenderness.  Musculoskeletal:        General: No tenderness.     Cervical back: Neck supple. No tenderness.     Comments: No increased swelling - improved. DP pulses palpable and equal bilaterally.   Lymphadenopathy:     Cervical: No cervical adenopathy.  Skin:    Findings: No erythema or rash.  Neurological:     Mental Status: He is alert.  Psychiatric:        Mood and Affect: Mood normal.        Behavior: Behavior normal.         Outpatient Encounter Medications as of 05/29/2024  Medication Sig   acetaminophen  (TYLENOL ) 500 MG tablet Take 500 mg by mouth every 6 (six) hours as needed.   allopurinol  (ZYLOPRIM ) 300 MG tablet Take 300 mg by mouth daily.   aspirin 81 MG tablet Take 81 mg by mouth daily.   bisacodyl (DULCOLAX) 5 MG EC tablet Take 5 mg by mouth daily as needed for moderate constipation.   fluticasone  (FLONASE ) 50 MCG/ACT nasal spray TWO PUFFS IN EACH NOSTRIL ONCE A DAY   folic acid (FOLVITE) 1 MG tablet Take 1 mg by mouth daily.   gabapentin  (NEURONTIN ) 100 MG capsule Take 1 capsule (100 mg total) by mouth 2 (two) times daily.   hydroxychloroquine (PLAQUENIL) 200 MG tablet Take 200 mg by mouth 2 (two) times daily.   losartan (COZAAR) 25 MG tablet Take 25 mg by mouth  daily.   methotrexate (RHEUMATREX) 2.5 MG tablet Take 15 mg by mouth once a week.   metolazone (ZAROXOLYN) 2.5 MG tablet Take 2.5 mg by mouth once a week.   omeprazole  (PRILOSEC) 20 MG capsule Take 1 capsule (20 mg total) by mouth 2 (two) times daily.   predniSONE  (DELTASONE ) 5 MG tablet Take 5 mg by mouth daily.   senna (SENOKOT) 8.6 MG TABS tablet Take 2 tablets by mouth daily.   torsemide (DEMADEX) 10 MG tablet Take 10 mg by mouth daily as needed.   traMADol  (ULTRAM ) 50 MG tablet Take 1 tablet (50 mg total) by mouth in the morning, at noon, and at bedtime. Each refill must last 30 days.   triamcinolone  cream (KENALOG ) 0.1 % Apply 1 Application topically 2 (two) times daily.   VITAMIN D , CHOLECALCIFEROL, PO Take 250 mcg by mouth once a week.   levothyroxine  (SYNTHROID ) 137 MCG tablet Take 1 tablet (137 mcg total) by mouth daily before breakfast.   valACYclovir  (VALTREX ) 1000 MG tablet Take 1 tablet (1,000 mg total) by mouth daily.   [DISCONTINUED] levothyroxine  (SYNTHROID ) 137 MCG tablet Take 1 tablet (137 mcg total) by mouth daily before breakfast.   [DISCONTINUED] valACYclovir  (VALTREX ) 1000 MG tablet Take 1 tablet (1,000 mg total) by mouth daily.   No facility-administered encounter medications on file as of 05/29/2024.     Lab Results  Component Value Date   WBC 10.6 (H) 09/05/2023   HGB 14.4 09/05/2023   HCT 43.8 09/05/2023   PLT 223.0 09/05/2023   GLUCOSE 93 06/01/2024   CHOL 174 06/01/2024   TRIG 185.0 (H) 06/01/2024   HDL 59.50 06/01/2024   LDLDIRECT 89.0 01/24/2023   LDLCALC 78 06/01/2024   ALT 25 06/01/2024   AST 24 06/01/2024   NA 143 06/01/2024   K 3.8 06/01/2024  CL 100 06/01/2024   CREATININE 1.63 (H) 06/01/2024   BUN 21 06/01/2024   CO2 29 06/01/2024   TSH 1.51 01/27/2024   PSA 0.00 (L) 06/16/2020   HGBA1C 5.3 01/27/2024    DG PAIN CLINIC C-ARM 1-60 MIN NO REPORT Result Date: 05/13/2022 Fluoro was used, but no Radiologist interpretation will be provided.  Please refer to NOTES tab for provider progress note.      Assessment & Plan:  Need for influenza vaccination -     Flu vaccine HIGH DOSE PF(Fluzone Trivalent)  Hypercholesterolemia Assessment & Plan: Low cholesterol diet and exercise. Continue crestor . Follow lipid panel.   Orders: -     Lipid panel; Future -     Hepatic function panel; Future -     Basic metabolic panel with GFR; Future -     CBC with Differential/Platelet; Future  Hyperglycemia Assessment & Plan: Low carb diet and exercise.  Follow met b and A1c.   Orders: -     Hemoglobin A1c; Future  Stage 3a chronic kidney disease (HCC) Assessment & Plan: Continues on avapro .  Avoid antiinflammatories.  Follow metabolic panel. Renal ultrasound - no acute abnormality. Have discussed the need to weigh daily.  Monitor sodium intake. Lower extremity swelling is better. Has scheduled upcoming appt with nephrology. Family request labs here and forwarded to nephrology   Orders: -     CBC with Differential/Platelet; Future -     Parathyroid  hormone, intact (no Ca) -     Uric acid -     Magnesium   Primary hypertension Assessment & Plan: Taking avapro . Off amlodipine . Torsemide and metolazone as directed.  Follow pressures. Follow metabolic panel.    Hypothyroidism, unspecified type Assessment & Plan: On thyroid  replacement. Follow tsh.   Orders: -     TSH; Future  Barrett's esophagus without dysplasia Assessment & Plan: No upper symptoms.  On prilosec.  Stable. Has been followed by GI.    Gout, unspecified cause, unspecified chronicity, unspecified site Assessment & Plan: Continues on allopurinol . Check uric acid level.    Other orders -     Levothyroxine  Sodium; Take 1 tablet (137 mcg total) by mouth daily before breakfast.  Dispense: 90 tablet; Refill: 1 -     valACYclovir  HCl; Take 1 tablet (1,000 mg total) by mouth daily.  Dispense: 90 tablet; Refill: 1     Allena Hamilton, MD

## 2024-05-30 ENCOUNTER — Encounter: Payer: Self-pay | Admitting: Internal Medicine

## 2024-05-30 LAB — PARATHYROID HORMONE, INTACT (NO CA): PTH: 68 pg/mL (ref 16–77)

## 2024-05-30 LAB — URIC ACID: Uric Acid, Serum: 5.2 mg/dL (ref 4.0–7.8)

## 2024-05-30 LAB — MAGNESIUM: Magnesium: 1.8 mg/dL (ref 1.5–2.5)

## 2024-05-30 NOTE — Telephone Encounter (Signed)
 Given that it is a chronic scheduled medication, I would need them to continue to follow at pain clinic.

## 2024-06-01 ENCOUNTER — Ambulatory Visit (INDEPENDENT_AMBULATORY_CARE_PROVIDER_SITE_OTHER)

## 2024-06-01 DIAGNOSIS — E7800 Pure hypercholesterolemia, unspecified: Secondary | ICD-10-CM | POA: Diagnosis not present

## 2024-06-01 LAB — LIPID PANEL
Cholesterol: 174 mg/dL (ref 0–200)
HDL: 59.5 mg/dL (ref >39.00)
LDL Cholesterol: 78 mg/dL (ref 0–99)
NonHDL: 114.7
Total CHOL/HDL Ratio: 3
Triglycerides: 185 mg/dL — ABNORMAL HIGH (ref 0.0–149.0)
VLDL: 37 mg/dL (ref 0.0–40.0)

## 2024-06-01 LAB — BASIC METABOLIC PANEL WITH GFR
BUN: 21 mg/dL (ref 6–23)
CO2: 29 meq/L (ref 19–32)
Calcium: 9.6 mg/dL (ref 8.4–10.5)
Chloride: 100 meq/L (ref 96–112)
Creatinine, Ser: 1.63 mg/dL — ABNORMAL HIGH (ref 0.40–1.50)
GFR: 37.79 mL/min — ABNORMAL LOW (ref >60.00)
Glucose, Bld: 93 mg/dL (ref 70–99)
Potassium: 3.8 meq/L (ref 3.5–5.1)
Sodium: 143 meq/L (ref 135–145)

## 2024-06-01 LAB — HEPATIC FUNCTION PANEL
ALT: 25 U/L (ref 0–53)
AST: 24 U/L (ref 0–37)
Albumin: 4.3 g/dL (ref 3.5–5.2)
Alkaline Phosphatase: 52 U/L (ref 39–117)
Bilirubin, Direct: 0.1 mg/dL (ref 0.0–0.3)
Total Bilirubin: 0.5 mg/dL (ref 0.2–1.2)
Total Protein: 6.7 g/dL (ref 6.0–8.3)

## 2024-06-03 ENCOUNTER — Ambulatory Visit: Payer: Self-pay | Admitting: Internal Medicine

## 2024-06-03 ENCOUNTER — Encounter: Payer: Self-pay | Admitting: Internal Medicine

## 2024-06-03 NOTE — Assessment & Plan Note (Signed)
 Low cholesterol diet and exercise. Continue crestor . Follow lipid panel.

## 2024-06-03 NOTE — Assessment & Plan Note (Signed)
 Continues on avapro .  Avoid antiinflammatories.  Follow metabolic panel. Renal ultrasound - no acute abnormality. Have discussed the need to weigh daily.  Monitor sodium intake. Lower extremity swelling is better. Has scheduled upcoming appt with nephrology. Family request labs here and forwarded to nephrology

## 2024-06-03 NOTE — Assessment & Plan Note (Signed)
 Low-carb diet and exercise.  Follow met b and A1c.

## 2024-06-03 NOTE — Assessment & Plan Note (Signed)
 On thyroid replacement.  Follow tsh.

## 2024-06-03 NOTE — Assessment & Plan Note (Signed)
No upper symptoms.  On prilosec.  Stable. Has been followed by GI.  

## 2024-06-03 NOTE — Assessment & Plan Note (Signed)
 Taking avapro . Off amlodipine . Torsemide and metolazone as directed.  Follow pressures. Follow metabolic panel.

## 2024-06-03 NOTE — Assessment & Plan Note (Signed)
 Continues on allopurinol . Check uric acid level.

## 2024-06-08 ENCOUNTER — Encounter: Payer: Self-pay | Admitting: Internal Medicine

## 2024-06-13 ENCOUNTER — Other Ambulatory Visit (INDEPENDENT_AMBULATORY_CARE_PROVIDER_SITE_OTHER)

## 2024-06-13 DIAGNOSIS — R739 Hyperglycemia, unspecified: Secondary | ICD-10-CM

## 2024-06-13 DIAGNOSIS — N1831 Chronic kidney disease, stage 3a: Secondary | ICD-10-CM | POA: Diagnosis not present

## 2024-06-13 DIAGNOSIS — E7800 Pure hypercholesterolemia, unspecified: Secondary | ICD-10-CM

## 2024-06-13 DIAGNOSIS — E039 Hypothyroidism, unspecified: Secondary | ICD-10-CM | POA: Diagnosis not present

## 2024-06-13 NOTE — Addendum Note (Signed)
 Addended by: MARYLEN PRO A on: 06/13/2024 12:31 PM   Modules accepted: Orders

## 2024-06-14 LAB — CBC WITH DIFFERENTIAL/PLATELET
Absolute Lymphocytes: 1346 {cells}/uL (ref 850–3900)
Absolute Monocytes: 528 {cells}/uL (ref 200–950)
Basophils Absolute: 44 {cells}/uL (ref 0–200)
Basophils Relative: 0.5 %
Eosinophils Absolute: 26 {cells}/uL (ref 15–500)
Eosinophils Relative: 0.3 %
HCT: 42 % (ref 38.5–50.0)
Hemoglobin: 14.2 g/dL (ref 13.2–17.1)
MCH: 34.7 pg — ABNORMAL HIGH (ref 27.0–33.0)
MCHC: 33.8 g/dL (ref 32.0–36.0)
MCV: 102.7 fL — ABNORMAL HIGH (ref 80.0–100.0)
MPV: 10.9 fL (ref 7.5–12.5)
Monocytes Relative: 6 %
Neutro Abs: 6855 {cells}/uL (ref 1500–7800)
Neutrophils Relative %: 77.9 %
Platelets: 242 Thousand/uL (ref 140–400)
RBC: 4.09 Million/uL — ABNORMAL LOW (ref 4.20–5.80)
RDW: 14 % (ref 11.0–15.0)
Total Lymphocyte: 15.3 %
WBC: 8.8 Thousand/uL (ref 3.8–10.8)

## 2024-06-14 LAB — HEMOGLOBIN A1C
Hgb A1c MFr Bld: 5.2 % (ref <5.7)
Mean Plasma Glucose: 103 mg/dL
eAG (mmol/L): 5.7 mmol/L

## 2024-06-14 LAB — TSH: TSH: 1.13 m[IU]/L (ref 0.40–4.50)

## 2024-06-19 DIAGNOSIS — Z7960 Long term (current) use of unspecified immunomodulators and immunosuppressants: Secondary | ICD-10-CM | POA: Diagnosis not present

## 2024-06-19 DIAGNOSIS — M0579 Rheumatoid arthritis with rheumatoid factor of multiple sites without organ or systems involvement: Secondary | ICD-10-CM | POA: Diagnosis not present

## 2024-06-19 DIAGNOSIS — M47816 Spondylosis without myelopathy or radiculopathy, lumbar region: Secondary | ICD-10-CM | POA: Diagnosis not present

## 2024-06-19 DIAGNOSIS — M1A09X0 Idiopathic chronic gout, multiple sites, without tophus (tophi): Secondary | ICD-10-CM | POA: Diagnosis not present

## 2024-06-25 DIAGNOSIS — N2581 Secondary hyperparathyroidism of renal origin: Secondary | ICD-10-CM | POA: Diagnosis not present

## 2024-06-25 DIAGNOSIS — I129 Hypertensive chronic kidney disease with stage 1 through stage 4 chronic kidney disease, or unspecified chronic kidney disease: Secondary | ICD-10-CM | POA: Diagnosis not present

## 2024-06-25 DIAGNOSIS — R801 Persistent proteinuria, unspecified: Secondary | ICD-10-CM | POA: Diagnosis not present

## 2024-06-25 DIAGNOSIS — H16143 Punctate keratitis, bilateral: Secondary | ICD-10-CM | POA: Diagnosis not present

## 2024-06-25 DIAGNOSIS — N1831 Chronic kidney disease, stage 3a: Secondary | ICD-10-CM | POA: Diagnosis not present

## 2024-07-18 DIAGNOSIS — H16143 Punctate keratitis, bilateral: Secondary | ICD-10-CM | POA: Diagnosis not present

## 2024-07-18 DIAGNOSIS — H16223 Keratoconjunctivitis sicca, not specified as Sjogren's, bilateral: Secondary | ICD-10-CM | POA: Diagnosis not present

## 2024-07-19 ENCOUNTER — Encounter: Payer: Self-pay | Admitting: Nurse Practitioner

## 2024-07-19 ENCOUNTER — Ambulatory Visit: Attending: Nurse Practitioner | Admitting: Nurse Practitioner

## 2024-07-19 DIAGNOSIS — G8929 Other chronic pain: Secondary | ICD-10-CM | POA: Insufficient documentation

## 2024-07-19 DIAGNOSIS — M79605 Pain in left leg: Secondary | ICD-10-CM | POA: Diagnosis present

## 2024-07-19 DIAGNOSIS — Z8546 Personal history of malignant neoplasm of prostate: Secondary | ICD-10-CM

## 2024-07-19 DIAGNOSIS — M51372 Other intervertebral disc degeneration, lumbosacral region with discogenic back pain and lower extremity pain: Secondary | ICD-10-CM | POA: Diagnosis present

## 2024-07-19 DIAGNOSIS — M5442 Lumbago with sciatica, left side: Secondary | ICD-10-CM | POA: Insufficient documentation

## 2024-07-19 DIAGNOSIS — Z79891 Long term (current) use of opiate analgesic: Secondary | ICD-10-CM | POA: Diagnosis present

## 2024-07-19 DIAGNOSIS — Z79899 Other long term (current) drug therapy: Secondary | ICD-10-CM | POA: Insufficient documentation

## 2024-07-19 DIAGNOSIS — F1120 Opioid dependence, uncomplicated: Secondary | ICD-10-CM

## 2024-07-19 DIAGNOSIS — M47816 Spondylosis without myelopathy or radiculopathy, lumbar region: Secondary | ICD-10-CM | POA: Diagnosis present

## 2024-07-19 DIAGNOSIS — M4310 Spondylolisthesis, site unspecified: Secondary | ICD-10-CM | POA: Diagnosis present

## 2024-07-19 DIAGNOSIS — G894 Chronic pain syndrome: Secondary | ICD-10-CM | POA: Insufficient documentation

## 2024-07-19 MED ORDER — TRAMADOL HCL 50 MG PO TABS: 50.0000 mg | ORAL_TABLET | Freq: Three times a day (TID) | ORAL | 5 refills | Status: AC

## 2024-07-19 NOTE — Progress Notes (Signed)
 Nursing Pain Medication Assessment:  Safety precautions to be maintained throughout the outpatient stay will include: orient to surroundings, keep bed in low position, maintain call bell within reach at all times, provide assistance with transfer out of bed and ambulation.  Medication Inspection Compliance: Pill count conducted under aseptic conditions, in front of the patient. Neither the pills nor the bottle was removed from the patient's sight at any time. Once count was completed pills were immediately returned to the patient in their original bottle.  Medication: Tramadol  (Ultram ) Pill/Patch Count: 65 of 90 pills/patches remain Pill/Patch Appearance: Markings consistent with prescribed medication Bottle Appearance: Standard pharmacy container. Clearly labeled. Filled Date: 15 / 03 / 2025 Last Medication intake:  Today

## 2024-07-19 NOTE — Progress Notes (Signed)
 PROVIDER NOTE: Interpretation of information contained herein should be left to medically-trained personnel. Specific patient instructions are provided elsewhere under Patient Instructions section of medical record. This document was created in part using AI and STT-dictation technology, any transcriptional errors that may result from this process are unintentional.  Patient: Hector [name]  Service: E/M   PCP: Glendia Shad, MD  DOB: 07-12-1935  DOS: 07/19/2024  Provider: Emmy MARLA Blanch, NP  MRN: [id number]  Delivery: Face-to-face  Specialty: Interventional Pain Management  Type: Established Patient  Setting: Ambulatory outpatient facility  Specialty designation: 09  Referring Prov.: Glendia Shad, MD  Location: Outpatient office facility       History of present illness (HPI) Mr. ROCK SOBOL, a 89 y.o. year old male, is here today because of his low back pain. Mr. Comacho's primary complain today is Back Pain  Pertinent problems: Mr. Kruse has Pharmacologic therapy; Shoulder pain, left; Chronic hip pain (Left); Chronic low back pain (1ry area of Pain) (Bilateral) (L>R) w/ sciatica (Left); Chronic pain syndrome; Other long term (current) drug therapy; Polyarthritis, unspecified (Secondary Area of Pain); L5-S1 bilateral pars defect with spondylolisthesis; Lumbosacral Grade 1 Anterolisthesis of L5 over S1; Lumbar facet arthropathy (Bilateral); Lumbar foraminal stenosis (L5-S1) (Bilateral) (L>R); Chronic Lumbar radiculitis (L5) (Left); Disorder of skeletal system; Problems influencing health status; Opiate use; Chronic musculoskeletal pain; Lumbar facet syndrome (Bilateral) (L>R); Chronic lower extremity pain (2ry area of Pain) (Left); Chronic lower extremity radicular pain (L5) (Left); Lumbar facet osteoarthritis (Bilateral); Osteoarthritis of lumbar spine; Lumbar spondylosis; Spondylosis without myelopathy or radiculopathy, lumbosacral region; Chronic right shoulder pain; DDD (degenerative disc disease),  lumbosacral; Osteopenia; Chronic low back pain (Bilateral) w/o sciatica; Chronic use of opiate for therapeutic purpose; Neurogenic pain; Lumbosacral radiculopathy at S1 (Right); Chronic lower extremity pain (Right); and Chronic low back pain (Bilateral) w/ sciatica (Right) on their pertinent problem list.  Pain Assessment: Severity of Chronic pain is reported as a 10-Worst pain ever/10. Location: Back Lower/unknown. Onset: More than a month ago. Quality: Aching. Timing: Constant. Modifying factor(s):  SABRA Vitals:  height is 5' 8 (1.727 m) and weight is 251 lb (113.9 kg). His temperature is 97 F (36.1 C) (abnormal). His blood pressure is 154/91 (abnormal) and his pulse is 41 (abnormal). His respiration is 18 and oxygen saturation is 95%.  BMI: Estimated body mass index is 38.16 kg/m as calculated from the following:   Height as of this encounter: 5' 8 (1.727 m).   Weight as of this encounter: 251 lb (113.9 kg).  Last encounter: 01/25/2024. Last procedure: 01/25/2024.  Reason for encounter: medication management.  The patient indicates doing well with current medication regimen.  No side effects or adverse reaction reported to medication.  Discussed the use of AI scribe software for clinical note transcription with the patient, who gave verbal consent to proceed.  History of Present Illness   Hector [name] is an 89 year old male with arthritis who presents with pain management concerns.  He experiences significant pain upon waking, particularly when moving from the bed to the kitchen, with pain levels reaching up to 8 out of 10. After taking his medication, the pain decreases to a more tolerable level of 2 out of 10.  He is currently taking tramadol  50 mg two to three times a day, specifically in the morning, midday, and at night around 3 AM when he experiences increased pain. No side effects from tramadol , such as constipation.  He has been under the care of  a rheumatologist and is on  methotrexate for arthritis, which has improved his condition since reaching the current dosage.     Pharmacotherapy Assessment   Analgesic: Tramadol  (Ultram ) 50 mg 3 times daily as needed for pain. MME=30 Monitoring: [place] PMP: PDMP reviewed during this encounter.       Pharmacotherapy: No side-effects or adverse reactions reported. Compliance: No problems identified. Effectiveness: Clinically acceptable.  Rebecka Wolm HERO, RN  07/19/2024  1:08 PM  Sign when Signing Visit Nursing Pain Medication Assessment:  Safety precautions to be maintained throughout the outpatient stay will include: orient to surroundings, keep bed in low position, maintain call bell within reach at all times, provide assistance with transfer out of bed and ambulation.  Medication Inspection Compliance: Pill count conducted under aseptic conditions, in front of the patient. Neither the pills nor the bottle was removed from the patient's sight at any time. Once count was completed pills were immediately returned to the patient in their original bottle.  Medication: Tramadol  (Ultram ) Pill/Patch Count: 65 of 90 pills/patches remain Pill/Patch Appearance: Markings consistent with prescribed medication Bottle Appearance: Standard pharmacy container. Clearly labeled. Filled Date: 48 / 03 / 2025 Last Medication intake:  Today    UDS:  Summary  Date Value Ref Range Status  01/25/2024 FINAL  Final    Comment:    ==================================================================== [employer] Select 13 (MW) ==================================================================== Test                             Result       Flag       Units  Drug Present and Declared for Prescription Verification   Tramadol                        >3472        EXPECTED   ng/mg creat   O-Desmethyltramadol            983          EXPECTED   ng/mg creat   N-Desmethyltramadol            >3472        EXPECTED   ng/mg creat    Source of tramadol  is a  prescription medication. O-desmethyltramadol    and N-desmethyltramadol are expected metabolites of tramadol .  ==================================================================== Test                      Result    Flag   Units      Ref Range   Creatinine              144              mg/dL      >=79 ==================================================================== Declared Medications:  The flagging and interpretation on this report are based on the  following declared medications.  Unexpected results may arise from  inaccuracies in the declared medications.   **Note: The testing scope of this panel includes these medications:   Tramadol    **Note: The testing scope of this panel does not include the  following reported medications:   Acetaminophen   Allopurinol   Aspirin  Bisacodyl  Cholecalciferol  Fluticasone   Folic Acid  Gabapentin   Hydroxychloroquine  Irbesartan   Levothyroxine   Losartan  Methotrexate  Metolazone  Naloxone   Omeprazole   Potassium  Prednisone   Sennosides  Torsemide  Triamcinolone   Valacyclovir  ==================================================================== For clinical consultation, please call [phone]. ====================================================================  No results found for: CBDTHCR No results found for: D8THCCBX No results found for: D9THCCBX  ROS  Constitutional: Denies any fever or chills Gastrointestinal: No reported hemesis, hematochezia, vomiting, or acute GI distress Musculoskeletal: Low back pain Neurological: No reported episodes of acute onset apraxia, aphasia, dysarthria, agnosia, amnesia, paralysis, loss of coordination, or loss of consciousness  Medication Review  Cholecalciferol, acetaminophen , allopurinol , aspirin, bisacodyl, fluticasone , folic acid, gabapentin , hydroxychloroquine, levothyroxine , losartan, methotrexate, metolazone, omeprazole , predniSONE , senna, torsemide, traMADol ,  triamcinolone  cream, and valACYclovir   History Review  Allergy: Mr. Pellerito is allergic to tape. Drug: Mr. Divis  reports no history of drug use. Alcohol:  reports no history of alcohol use. Tobacco:  reports that he has never smoked. He has never used smokeless tobacco. Social: Mr. Bothwell  reports that he has never smoked. He has never used smokeless tobacco. He reports that he does not drink alcohol and does not use drugs. Medical:  has a past medical history of Acute postoperative pain (03/21/2018), Allergy, Anemia, Arrhythmia, Arthritis, Barrett esophagus, Cancer (HCC), Cataract, Chicken pox, Diverticulitis, Dysrhythmia, GERD (gastroesophageal reflux disease), Hyperlipidemia, Hypertension, Hypothyroidism, Kidney disease, Melanoma (HCC), Rheumatoid arteritis (HCC), Sleep apnea, and Ulcer. Surgical: Mr. Lesesne  has a past surgical history that includes Prostate surgery; cystocopy (2003); Hemorrhoid surgery; Cardiac catheterization; sleep study; Colon surgery (2006-2008-2011); Cataract Surgery (Right, 02/13/14); Esophagogastroduodenoscopy (egd) with propofol  (N/A, 08/04/2016); Colonoscopy w/ polypectomy; Colonoscopy with propofol  (N/A, 02/15/2018); Colonoscopy with propofol  (N/A, 03/24/2020); Esophagogastroduodenoscopy (egd) with propofol  (N/A, 03/24/2020); and Cystoscopy/retrograde/ureteroscopy/stone extraction with basket (Right, 10/28/2020). Family: family history includes Breast cancer in his daughter; Heart disease in his father; Kidney disease in his father; Liver disease in his mother.  Laboratory Chemistry Profile   Renal Lab Results  Component Value Date   BUN 21 06/01/2024   CREATININE 1.63 (H) 06/01/2024   LABCREA 125 04/28/2023   BCR 10 06/13/2017   GFR 37.79 (L) 06/01/2024   GFRAA >60 01/23/2018   GFRNONAA 38 (L) 10/18/2020    Hepatic Lab Results  Component Value Date   AST 24 06/01/2024   ALT 25 06/01/2024   ALBUMIN 4.3 06/01/2024   ALKPHOS 52 06/01/2024   LIPASE 27 10/18/2020     Electrolytes Lab Results  Component Value Date   NA 143 06/01/2024   K 3.8 06/01/2024   CL 100 06/01/2024   CALCIUM  9.6 06/01/2024   MG 1.8 05/29/2024   PHOS 2.6 04/28/2023    Bone Lab Results  Component Value Date   VD25OH 33.78 04/06/2022   25OHVITD1 20 (L) 06/13/2017   25OHVITD2 <1.0 06/13/2017   25OHVITD3 20 06/13/2017    Inflammation (CRP: Acute Phase) (ESR: Chronic Phase) Lab Results  Component Value Date   CRP 2.8 06/13/2017   ESRSEDRATE 13 06/13/2017         Note: Above Lab results reviewed.  Recent Imaging Review  VAS US  ABI WITH/WO TBI  LOWER EXTREMITY DOPPLER STUDY  Patient Name:  KERMIT ARNETTE  Date of Exam:   12/28/2023 Medical Rec #: [id number]     Accession #:    [id number] Date of Birth: 1935/03/11     Patient Gender: M Patient Age:   51 years Exam Location:  [facility] Procedure:      VAS US  ABI WITH/WO TBI Referring Phys: Cordella Shawl  --------------------------------------------------------------------------------   Indications: Discoloration in toes   Performing Technologist: Leafy Gibes RVS    Examination Guidelines: A complete evaluation includes at minimum, Doppler waveform signals and systolic blood pressure reading at the  level of bilateral brachial, anterior tibial, and posterior tibial arteries, when vessel segments are accessible. Bilateral testing is considered an integral part of a complete examination. Photoelectric Plethysmograph (PPG) waveforms and toe systolic pressure readings are included as required and additional duplex testing as needed. Limited examinations for reoccurring indications may be performed as noted.    ABI Findings: +---------+------------------+-----+---------+--------+ Right    Rt Pressure (mmHg)IndexWaveform Comment  +---------+------------------+-----+---------+--------+ Brachial 204                                       +---------+------------------+-----+---------+--------+ ATA      243               1.17 triphasic         +---------+------------------+-----+---------+--------+ PTA      243               1.17 triphasic         +---------+------------------+-----+---------+--------+ Great Toe206               1.00 Normal            +---------+------------------+-----+---------+--------+  +---------+------------------+-----+---------+-------+ Left     Lt Pressure (mmHg)IndexWaveform Comment +---------+------------------+-----+---------+-------+ Brachial 207                                     +---------+------------------+-----+---------+-------+ ATA      244               1.18 triphasic        +---------+------------------+-----+---------+-------+ PTA      249               1.20 triphasic        +---------+------------------+-----+---------+-------+ Great Toe235               1.14 Normal           +---------+------------------+-----+---------+-------+  +-------+-----------+-----------+------------+------------+ ABI/TBIToday's ABIToday's TBIPrevious ABIPrevious TBI +-------+-----------+-----------+------------+------------+ Right  1.17       1.00                                +-------+-----------+-----------+------------+------------+ Left   1.20       1.14                                +-------+-----------+-----------+------------+------------+  TOES Findings: +----------+---------------+--------+-------+ Right ToesPressure (mmHg)WaveformComment +----------+---------------+--------+-------+ 1st Digit 26             Normal          +----------+---------------+--------+-------+ 2nd Digit 9              Normal          +----------+---------------+--------+-------+ 3rd Digit 11             Normal          +----------+---------------+--------+-------+ 4th Digit 7              Normal           +----------+---------------+--------+-------+ 5th Digit 11             Normal          +----------+---------------+--------+-------+     +---------+---------------+--------+-------+ Left ToesPressure (mmHg)WaveformComment +---------+---------------+--------+-------+ 1st Digit10  Normal          +---------+---------------+--------+-------+ 2nd Digit6              Normal          +---------+---------------+--------+-------+ 3rd Digit9              Normal          +---------+---------------+--------+-------+ 4th Digit12             Normal          +---------+---------------+--------+-------+ 5th Digit18             Normal          +---------+---------------+--------+-------+          Summary: Right: Resting right ankle-brachial index is within normal range. The right toe-brachial index is normal.  Left: Resting left ankle-brachial index is within normal range. The left toe-brachial index is normal.  *See table(s) above for measurements and observations.    Electronically signed by Cordella Shawl MD on 12/29/2023 at 4:08:52 PM.      Final   Note: Reviewed        Physical Exam  Vitals: BP (!) 154/91   Pulse (!) 41   Temp (!) 97 F (36.1 C)   Resp 18   Ht 5' 8 (1.727 m)   Wt 251 lb (113.9 kg)   SpO2 95%   BMI 38.16 kg/m  BMI: Estimated body mass index is 38.16 kg/m as calculated from the following:   Height as of this encounter: 5' 8 (1.727 m).   Weight as of this encounter: 251 lb (113.9 kg). Ideal: Ideal body weight: 68.4 kg (150 lb 12.7 oz) Adjusted ideal body weight: 86.6 kg (190 lb 14 oz) General appearance: Well nourished, well developed, and well hydrated. In no apparent acute distress Mental status: Alert, oriented x 3 (person, place, & time)       Respiratory: No evidence of acute respiratory distress Eyes: PERLA  Musculoskeletal: +LBP Assessment   Diagnosis Status  1. Chronic low back pain (1ry  area of Pain) (Bilateral) (L>R) w/ sciatica (Left)   2. Chronic pain syndrome   3. Pharmacologic therapy   4. Lumbar facet arthropathy (Bilateral)   5. Lumbar facet syndrome (Bilateral) (L>R)   6. Chronic use of opiate for therapeutic purpose   7. Chronic lower extremity pain (2ry area of Pain) (Left)   8. Degeneration of intervertebral disc of lumbosacral region with discogenic back pain and lower extremity pain   9. Lumbosacral Grade 1 Anterolisthesis of L5 over S1    Controlled Controlled Controlled   Updated Problems: No problems updated.  Plan of Care  Problem-specific:  Assessment and Plan    Chronic pain syndrome Pain peaks at 8/10 in the morning, decreases during the day. Managed with tramadol , effective without side effects. Methotrexate improved arthritis pain. - Continue tramadol  50 mg three times daily as needed. - Sent tramadol  prescription to [facility] with five refills.  Pharmacologic therapy for chronic pain Tramadol  and methotrexate effectively manage pain. No adverse effects reported. - Continue methotrexate as prescribed by rheumatologist.  Patient's pain is controlled with tramadol , will continue on current medication regimen.  Prescribing drug monitoring (PDMP) reviewed findings consistent with the use of prescribed medication and no evidence of narcotic misuse or abuse.  No side effects or adverse reaction reported to medication.  Schedule follow-up in 6 months for medication management.      Mr. DEMONE LYLES has a current medication list which  includes the following long-term medication(s): fluticasone , gabapentin , levothyroxine , losartan, metolazone, omeprazole , torsemide, and tramadol .  Pharmacotherapy (Medications Ordered): Meds ordered this encounter  Medications   traMADol  (ULTRAM ) 50 MG tablet    Sig: Take 1 tablet (50 mg total) by mouth in the morning, at noon, and at bedtime. Each refill must last 30 days.    Dispense:  90 tablet     Refill:  5    DO NOT: delete (not duplicate); no partial-fill (will deny script to complete), no refill request (F/U required). DISPENSE: 1 day early if closed on fill date. WARN: No CNS-depressants within 8 hrs of med.   Orders:  No orders of the defined types were placed in this encounter.       Return in about 6 months (around 01/17/2025) for (F2F), (MM), Emmy Blanch NP.    Recent Visits No visits were found meeting these conditions. Showing recent visits within past 90 days and meeting all other requirements Today's Visits Date Type Provider Dept  07/19/24 Office Visit Majour Frei K, NP [facility]  Showing today's visits and meeting all other requirements Future Appointments No visits were found meeting these conditions. Showing future appointments within next 90 days and meeting all other requirements  I discussed the assessment and treatment plan with the patient. The patient was provided an opportunity to ask questions and all were answered. The patient agreed with the plan and demonstrated an understanding of the instructions.  Patient advised to call back or seek an in-person evaluation if the symptoms or condition worsens.  I personally spent a total of 30 minutes in the care of the patient today including preparing to see the patient, getting/reviewing separately obtained history, performing a medically appropriate exam/evaluation, counseling and educating, placing orders, referring and communicating with other health care professionals, documenting clinical information in the EHR, independently interpreting results, communicating results, and coordinating care.   Note by: Calani Gick K Zhoey Blackstock, NP (TTS and AI technology used. I apologize for any typographical errors that were not detected and corrected.) Date: 07/19/2024; Time: 1:20 PM

## 2024-10-01 ENCOUNTER — Ambulatory Visit: Admitting: Internal Medicine

## 2025-01-07 ENCOUNTER — Encounter: Admitting: Nurse Practitioner
